# Patient Record
Sex: Female | Born: 1950 | Race: Black or African American | Hispanic: No | Smoking: Former smoker
Health system: Southern US, Community
[De-identification: ages and names within clinical notes are randomized; demographics above are authoritative.]

## PROBLEM LIST (undated history)

## (undated) DIAGNOSIS — I729 Aneurysm of unspecified site: Secondary | ICD-10-CM

## (undated) DIAGNOSIS — K8020 Calculus of gallbladder without cholecystitis without obstruction: Secondary | ICD-10-CM

## (undated) DIAGNOSIS — K219 Gastro-esophageal reflux disease without esophagitis: Secondary | ICD-10-CM

## (undated) DIAGNOSIS — G629 Polyneuropathy, unspecified: Secondary | ICD-10-CM

## (undated) DIAGNOSIS — I10 Essential (primary) hypertension: Secondary | ICD-10-CM

## (undated) DIAGNOSIS — E119 Type 2 diabetes mellitus without complications: Secondary | ICD-10-CM

## (undated) DIAGNOSIS — J309 Allergic rhinitis, unspecified: Secondary | ICD-10-CM

## (undated) DIAGNOSIS — I878 Other specified disorders of veins: Secondary | ICD-10-CM

## (undated) HISTORY — DX: Calculus of gallbladder without cholecystitis without obstruction: K80.20

## (undated) HISTORY — PX: CERVICAL FUSION: SHX112

## (undated) HISTORY — DX: Aneurysm of unspecified site: I72.9

## (undated) HISTORY — DX: Other specified disorders of veins: I87.8

## (undated) HISTORY — PX: EYE SURGERY: SHX253

---

## 1970-02-20 HISTORY — PX: ABDOMINAL HYSTERECTOMY: SHX81

## 2004-12-28 ENCOUNTER — Ambulatory Visit: Payer: Self-pay | Admitting: Family Medicine

## 2006-01-22 ENCOUNTER — Ambulatory Visit: Payer: Self-pay

## 2007-04-20 ENCOUNTER — Emergency Department: Payer: Self-pay | Admitting: Emergency Medicine

## 2007-05-17 ENCOUNTER — Encounter: Payer: Self-pay | Admitting: Physician Assistant

## 2007-05-22 ENCOUNTER — Encounter: Payer: Self-pay | Admitting: Physician Assistant

## 2007-05-27 ENCOUNTER — Ambulatory Visit: Payer: Self-pay | Admitting: Family Medicine

## 2007-08-05 ENCOUNTER — Ambulatory Visit: Payer: Self-pay

## 2007-10-16 ENCOUNTER — Ambulatory Visit: Payer: Self-pay | Admitting: Pain Medicine

## 2007-11-05 ENCOUNTER — Ambulatory Visit: Payer: Self-pay | Admitting: Pain Medicine

## 2007-11-14 ENCOUNTER — Ambulatory Visit: Payer: Self-pay | Admitting: Pain Medicine

## 2007-11-20 ENCOUNTER — Ambulatory Visit: Payer: Self-pay | Admitting: Pain Medicine

## 2007-12-04 ENCOUNTER — Ambulatory Visit: Payer: Self-pay | Admitting: Unknown Physician Specialty

## 2007-12-09 ENCOUNTER — Ambulatory Visit: Payer: Self-pay | Admitting: Unknown Physician Specialty

## 2008-06-10 ENCOUNTER — Ambulatory Visit: Payer: Self-pay

## 2009-06-16 ENCOUNTER — Ambulatory Visit: Payer: Self-pay

## 2010-01-17 ENCOUNTER — Ambulatory Visit: Payer: Self-pay

## 2010-06-21 ENCOUNTER — Ambulatory Visit: Payer: Self-pay

## 2011-06-28 ENCOUNTER — Ambulatory Visit: Payer: Self-pay | Admitting: Internal Medicine

## 2011-09-26 DIAGNOSIS — IMO0001 Reserved for inherently not codable concepts without codable children: Secondary | ICD-10-CM | POA: Insufficient documentation

## 2012-02-27 DIAGNOSIS — L309 Dermatitis, unspecified: Secondary | ICD-10-CM | POA: Insufficient documentation

## 2012-04-09 DIAGNOSIS — M1991 Primary osteoarthritis, unspecified site: Secondary | ICD-10-CM | POA: Insufficient documentation

## 2012-05-24 DIAGNOSIS — I10 Essential (primary) hypertension: Secondary | ICD-10-CM | POA: Insufficient documentation

## 2012-05-24 DIAGNOSIS — E119 Type 2 diabetes mellitus without complications: Secondary | ICD-10-CM | POA: Insufficient documentation

## 2012-05-24 DIAGNOSIS — Z794 Long term (current) use of insulin: Secondary | ICD-10-CM | POA: Insufficient documentation

## 2012-07-18 ENCOUNTER — Ambulatory Visit: Payer: Self-pay | Admitting: Adult Health

## 2012-10-10 ENCOUNTER — Ambulatory Visit: Payer: Self-pay | Admitting: Internal Medicine

## 2012-11-06 ENCOUNTER — Ambulatory Visit: Payer: Self-pay | Admitting: Adult Health

## 2013-07-24 ENCOUNTER — Ambulatory Visit: Payer: Self-pay | Admitting: Family Medicine

## 2014-01-18 ENCOUNTER — Ambulatory Visit: Payer: Self-pay

## 2014-06-07 ENCOUNTER — Ambulatory Visit
Admit: 2014-06-07 | Disposition: A | Discharge status: Home / Self Care | Payer: Self-pay | Attending: Family Medicine | Admitting: Family Medicine

## 2014-06-07 LAB — URINALYSIS, COMPLETE
BACTERIA: NEGATIVE
BLOOD: NEGATIVE
Bilirubin,UR: NEGATIVE
Glucose,UR: 500
Ketone: NEGATIVE
Leukocyte Esterase: NEGATIVE
Nitrite: NEGATIVE
PROTEIN: NEGATIVE
Ph: 6 (ref 5.0–8.0)
Specific Gravity: 1.01 (ref 1.000–1.030)

## 2014-07-03 ENCOUNTER — Other Ambulatory Visit: Payer: Self-pay | Admitting: Family Medicine

## 2014-07-03 ENCOUNTER — Other Ambulatory Visit: Payer: Self-pay | Admitting: Internal Medicine

## 2014-07-03 DIAGNOSIS — Z1231 Encounter for screening mammogram for malignant neoplasm of breast: Secondary | ICD-10-CM

## 2014-07-27 ENCOUNTER — Ambulatory Visit: Admission: RE | Admit: 2014-07-27 | Payer: Self-pay | Source: Ambulatory Visit

## 2014-08-04 ENCOUNTER — Ambulatory Visit
Admission: RE | Admit: 2014-08-04 | Discharge: 2014-08-04 | Disposition: A | Discharge status: Home / Self Care | Payer: Medicare HMO | Source: Ambulatory Visit | Attending: Family Medicine | Admitting: Family Medicine

## 2014-08-04 DIAGNOSIS — Z1231 Encounter for screening mammogram for malignant neoplasm of breast: Secondary | ICD-10-CM

## 2014-08-09 DIAGNOSIS — E1142 Type 2 diabetes mellitus with diabetic polyneuropathy: Secondary | ICD-10-CM | POA: Insufficient documentation

## 2014-08-09 DIAGNOSIS — Z4681 Encounter for fitting and adjustment of insulin pump: Secondary | ICD-10-CM | POA: Insufficient documentation

## 2014-10-25 ENCOUNTER — Ambulatory Visit
Admission: EM | Admit: 2014-10-25 | Discharge: 2014-10-25 | Disposition: A | Discharge status: Home / Self Care | Payer: Medicare HMO | Attending: Internal Medicine | Admitting: Internal Medicine

## 2014-10-25 DIAGNOSIS — J209 Acute bronchitis, unspecified: Secondary | ICD-10-CM

## 2014-10-25 DIAGNOSIS — J019 Acute sinusitis, unspecified: Secondary | ICD-10-CM | POA: Diagnosis not present

## 2014-10-25 HISTORY — DX: Type 2 diabetes mellitus without complications: E11.9

## 2014-10-25 HISTORY — DX: Allergic rhinitis, unspecified: J30.9

## 2014-10-25 HISTORY — DX: Polyneuropathy, unspecified: G62.9

## 2014-10-25 HISTORY — DX: Gastro-esophageal reflux disease without esophagitis: K21.9

## 2014-10-25 HISTORY — DX: Essential (primary) hypertension: I10

## 2014-10-25 MED ORDER — PREDNISONE 50 MG PO TABS: 50.0000 mg | ORAL_TABLET | Freq: Every day | ORAL | Status: DC

## 2014-10-25 MED ORDER — IPRATROPIUM BROMIDE HFA 17 MCG/ACT IN AERS: 2.0000 | INHALATION_SPRAY | RESPIRATORY_TRACT | Status: DC | PRN

## 2014-10-25 NOTE — ED Provider Notes (Signed)
CSN: [id number]     Arrival date & time 10/25/14  1223 History   First MD Initiated Contact with Patient 10/25/14 1338     Chief Complaint  Patient presents with  . Cough  . Hip Pain   HPI  Patient is a 64 year old lady with past history of smoking who has been having some trouble with respiratory symptoms since the middle of August. She saw her PCP, Sabino Snipes, on August 22, and received prescriptions for amoxicillin, Tessalon Perles, and a Ventolin inhaler. She has had some improvement in her sinus symptoms, but now is having worsening wheezing and cough, some chest tightness, central chest hurts when she coughs. She also reports some longer standing discomfort in her left low back and hip with discomfort radiating down her leg. She has an appointment with a spine specialist on September 8. She was able to walk into the urgent care today independently. No fever. No GI symptoms reported  Past Medical History  Diagnosis Date  . Allergic rhinitis   . Diabetes type 2, controlled   . Hypertension   . GERD (gastroesophageal reflux disease)   . Neuropathy    Past Surgical History  Procedure Laterality Date  . Abdominal hysterectomy  1972   Family History  Problem Relation Age of Onset  . Lung cancer Father    Social History  Substance Use Topics  . Smoking status: Former Smoker    Quit date: 02/21/1999  . Smokeless tobacco: None  . Alcohol Use: No    Review of Systems  All other systems reviewed and are negative.   Allergies  Review of patient's allergies indicates no known allergies.  Home Medications   Prior to Admission medications   Medication Sig Start Date End Date Taking? Authorizing Provider  albuterol (PROVENTIL HFA;VENTOLIN HFA) 108 (90 BASE) MCG/ACT inhaler Inhale into the lungs every 6 (six) hours as needed for wheezing or shortness of breath.   Yes Historical Provider, MD  benzonatate (TESSALON) 100 MG capsule Take by mouth 3 (three) times daily as needed  for cough.   Yes Historical Provider, MD  fluticasone (FLONASE) 50 MCG/ACT nasal spray Place into both nostrils daily.   Yes Historical Provider, MD  gabapentin (NEURONTIN) 800 MG tablet Take 800 mg by mouth 3 (three) times daily.   Yes Historical Provider, MD  hydrochlorothiazide (HYDRODIURIL) 25 MG tablet Take 25 mg by mouth daily.   Yes Historical Provider, MD  Insulin Glulisine (APIDRA SOLOSTAR) 100 UNIT/ML Solostar Pen Inject into the skin.   Yes Historical Provider, MD  ketotifen (ZADITOR) 0.025 % ophthalmic solution 1 drop 2 (two) times daily.   Yes Historical Provider, MD  lisinopril (PRINIVIL,ZESTRIL) 10 MG tablet Take 10 mg by mouth daily.   Yes Historical Provider, MD  loratadine (CLARITIN) 10 MG tablet Take 10 mg by mouth daily.   Yes Historical Provider, MD  metFORMIN (GLUMETZA) 500 MG (MOD) 24 hr tablet Take 500 mg by mouth daily with breakfast.   Yes Historical Provider, MD  naproxen (NAPROSYN) 500 MG tablet Take 500 mg by mouth 2 (two) times daily with a meal.   Yes Historical Provider, MD  pantoprazole (PROTONIX) 40 MG tablet Take 40 mg by mouth daily.   Yes Historical Provider, MD  polyethylene glycol (MIRALAX / GLYCOLAX) packet Take 17 g by mouth daily.   Yes Historical Provider, MD                  BP 130/87 mmHg  Pulse 83  Temp(Src)  98 F (36.7 C) (Oral)  Resp 16  Ht 5\' 7"  (1.702 m)  Wt 158 lb (71.668 kg)  BMI 24.74 kg/m2  SpO2 94%   Physical Exam  Constitutional: She is oriented to person, place, and time. No distress.  Alert, nicely groomed Coughing several times during the exam  HENT:  Head: Atraumatic.  Bilateral TMs are translucent, no erythema Moderate nasal congestion bilaterally Throat is pink   Eyes:  Conjugate gaze, no eye redness/drainage  Neck: Neck supple.  Cardiovascular: Normal rate and regular rhythm.   Pulmonary/Chest: No respiratory distress. She has no wheezes. She has no rales.  Coarse breath sounds throughout, symmetric breath  sounds Deep breath causes coughing  Abdominal: She exhibits no distension.  Musculoskeletal: She exhibits no edema.  No leg swelling  Neurological: She is alert and oriented to person, place, and time.  Skin: Skin is warm and dry.  No cyanosis  Nursing note and vitals reviewed.   ED Course  Procedures   MDM   1. Acute bronchitis, unspecified organism   2. Acute sinusitis, recurrence not specified, unspecified location    Discharge Medication List as of 10/25/2014  2:02 PM    START taking these medications   Details  ipratropium (ATROVENT HFA) 17 MCG/ACT inhaler Inhale 2 puffs into the lungs every 4 (four) hours as needed for wheezing., Starting 10/25/2014, Until Discontinued, Normal    predniSONE (DELTASONE) 50 MG tablet Take 1 tablet (50 mg total) by mouth daily., Starting 10/25/2014, Until Discontinued, Normal       Continue albuterol inhaler. Anticipate gradual improvement in cough over the next 2-3 weeks, improvement in wheezing over the next 2-3 days.Edmonia Lynch or follow-up PCP for new fever greater than 100.5, worsening wheezing/chest tightness, increasing phlegm production.    Sherlene Shams, MD 10/25/14 [phone]

## 2014-10-25 NOTE — ED Notes (Signed)
Patient states that she has been having cough, congestion, and wheezing. She states that symptoms started on 10/12/2014 and she was given albuterol inhaler and tessalon perles. She states that the back pain and leg/hip pain have been going on for some time.

## 2014-10-25 NOTE — Discharge Instructions (Signed)
Prescriptions for ipratropium inhaler, prednisone, were sent to the [facility] on [address]. These should help with wheezing and cough.   Anticipate gradual improvement in the cough over the next 2-3 weeks; anticipate improvement in wheezing/chest tightness over the next few days. Recheck or followup with Ailene Ravel Soles/PCP if not starting to improve in a few days, for new fever >100.5, increasing phlegm production.  Acute Bronchitis Bronchitis is when the airways that extend from the windpipe into the lungs get red, puffy, and painful (inflamed). Bronchitis often causes thick spit (mucus) to develop. This leads to a cough. A cough is the most common symptom of bronchitis. In acute bronchitis, the condition usually begins suddenly and goes away over time (usually in 2 weeks). Smoking, allergies, and asthma can make bronchitis worse. Repeated episodes of bronchitis may cause more lung problems. HOME CARE  Rest.  Drink enough fluids to keep your pee (urine) clear or pale yellow (unless you need to limit fluids as told by your doctor).  Only take over-the-counter or prescription medicines as told by your doctor.  Avoid smoking and secondhand smoke. These can make bronchitis worse. If you are a smoker, think about using nicotine gum or skin patches. Quitting smoking will help your lungs heal faster.  Reduce the chance of getting bronchitis again by:  Washing your hands often.  Avoiding people with cold symptoms.  Trying not to touch your hands to your mouth, nose, or eyes.  Follow up with your doctor as told. GET HELP IF: Your symptoms do not improve after 1 week of treatment. Symptoms include:  Cough.  Fever.  Coughing up thick spit.  Body aches.  Chest congestion.  Chills.  Shortness of breath.  Sore throat. GET HELP RIGHT AWAY IF:   You have an increased fever.  You have chills.  You have severe shortness of breath.  You have bloody thick spit  (sputum).  You throw up (vomit) often.  You lose too much body fluid (dehydration).  You have a severe headache.  You faint. MAKE SURE YOU:   Understand these instructions.  Will watch your condition.  Will get help right away if you are not doing well or get worse. Document Released: 07/26/2007 Document Revised: 10/09/2012 Document Reviewed: 07/30/2012 [facility] Patient Information 2015 [place], [place]. This information is not intended to replace advice given to you by your health care provider. Make sure you discuss any questions you have with your health care provider.

## 2014-11-21 ENCOUNTER — Emergency Department
Admission: EM | Admit: 2014-11-21 | Discharge: 2014-11-21 | Disposition: A | Discharge status: Home / Self Care | Payer: Medicare HMO | Attending: Emergency Medicine | Admitting: Emergency Medicine

## 2014-11-21 ENCOUNTER — Encounter: Payer: Self-pay | Admitting: Emergency Medicine

## 2014-11-21 DIAGNOSIS — E1165 Type 2 diabetes mellitus with hyperglycemia: Secondary | ICD-10-CM | POA: Diagnosis not present

## 2014-11-21 DIAGNOSIS — Z7951 Long term (current) use of inhaled steroids: Secondary | ICD-10-CM | POA: Insufficient documentation

## 2014-11-21 DIAGNOSIS — Z87891 Personal history of nicotine dependence: Secondary | ICD-10-CM | POA: Diagnosis not present

## 2014-11-21 DIAGNOSIS — M549 Dorsalgia, unspecified: Secondary | ICD-10-CM | POA: Diagnosis not present

## 2014-11-21 DIAGNOSIS — Z79899 Other long term (current) drug therapy: Secondary | ICD-10-CM | POA: Diagnosis not present

## 2014-11-21 DIAGNOSIS — Z7952 Long term (current) use of systemic steroids: Secondary | ICD-10-CM | POA: Insufficient documentation

## 2014-11-21 DIAGNOSIS — Z791 Long term (current) use of non-steroidal anti-inflammatories (NSAID): Secondary | ICD-10-CM | POA: Diagnosis not present

## 2014-11-21 DIAGNOSIS — Z794 Long term (current) use of insulin: Secondary | ICD-10-CM | POA: Diagnosis not present

## 2014-11-21 DIAGNOSIS — R739 Hyperglycemia, unspecified: Secondary | ICD-10-CM

## 2014-11-21 DIAGNOSIS — R42 Dizziness and giddiness: Secondary | ICD-10-CM | POA: Diagnosis present

## 2014-11-21 DIAGNOSIS — I10 Essential (primary) hypertension: Secondary | ICD-10-CM | POA: Insufficient documentation

## 2014-11-21 LAB — URINALYSIS COMPLETE WITH MICROSCOPIC (ARMC ONLY)
Bacteria, UA: NONE SEEN
Bilirubin Urine: NEGATIVE
Glucose, UA: >500 mg/dL — AB
HGB URINE DIPSTICK: NEGATIVE
Ketones, ur: NEGATIVE mg/dL
Leukocytes, UA: NEGATIVE
Nitrite: NEGATIVE
Protein, ur: NEGATIVE mg/dL
SPECIFIC GRAVITY, URINE: 1.016 (ref 1.005–1.030)
WBC, UA: NONE SEEN WBC/hpf (ref 0–5)
pH: 5 (ref 5.0–8.0)

## 2014-11-21 LAB — BASIC METABOLIC PANEL
ANION GAP: 9 (ref 5–15)
BUN: 15 mg/dL (ref 6–20)
CALCIUM: 9.1 mg/dL (ref 8.9–10.3)
CO2: 26 mmol/L (ref 22–32)
Chloride: 97 mmol/L — ABNORMAL LOW (ref 101–111)
Creatinine, Ser: 1.11 mg/dL — ABNORMAL HIGH (ref 0.44–1.00)
GFR calc non Af Amer: 51 mL/min — ABNORMAL LOW (ref >60)
GFR, EST AFRICAN AMERICAN: 59 mL/min — AB (ref >60)
Glucose, Bld: 524 mg/dL (ref 65–99)
POTASSIUM: 4 mmol/L (ref 3.5–5.1)
Sodium: 132 mmol/L — ABNORMAL LOW (ref 135–145)

## 2014-11-21 LAB — CBC
HCT: 38.1 % (ref 35.0–47.0)
Hemoglobin: 11.8 g/dL — ABNORMAL LOW (ref 12.0–16.0)
MCH: 23.6 pg — AB (ref 26.0–34.0)
MCHC: 31.1 g/dL — ABNORMAL LOW (ref 32.0–36.0)
MCV: 76 fL — ABNORMAL LOW (ref 80.0–100.0)
Platelets: 344 10*3/uL (ref 150–440)
RBC: 5.01 MIL/uL (ref 3.80–5.20)
RDW: 13.6 % (ref 11.5–14.5)
WBC: 12.3 10*3/uL — ABNORMAL HIGH (ref 3.6–11.0)

## 2014-11-21 LAB — GLUCOSE, CAPILLARY
GLUCOSE-CAPILLARY: 499 mg/dL — AB (ref 65–99)
Glucose-Capillary: 339 mg/dL — ABNORMAL HIGH (ref 65–99)

## 2014-11-21 MED ORDER — SODIUM CHLORIDE 0.9 % IV BOLUS (SEPSIS): 1000.0000 mL | Freq: Once | INTRAVENOUS | Status: DC

## 2014-11-21 NOTE — ED Notes (Signed)
Discharge instructions and follow-up care reviewed with patient. No questions at this time. Pt stable at discharge.

## 2014-11-21 NOTE — ED Provider Notes (Signed)
Time Seen: Approximately ----------------------------------------- 5:18 PM on 11/21/2014 -----------------------------------------    I have reviewed the triage notes  Chief Complaint: Back Pain and Dizziness   History of Present Illness: Audrey Chan is a 64 y.o. female who presents with feelings of dizziness described as lightheadedness. Patient states she recently had an epidural on Wednesday for sciatica . She is an insulin-dependent diabetic and has an insulin pump. Patient states that she's noticed that her blood sugars have run elevated over the last 48 hours. She states her pump seems to be working and she used it last night. She states she also used it this morning though had a soft drink at home but was not a diet soft drink and has not used her pump since that time. She denies any chest pain or shortness of breath. She denies any fever. She denies any weakness in her left leg. She denies any incontinence of bladder or bowel function.  Past Medical History  Diagnosis Date  . Allergic rhinitis   . Diabetes type 2, controlled ([place])   . Hypertension   . GERD (gastroesophageal reflux disease)   . Neuropathy ([place])     There are no active problems to display for this patient.   Past Surgical History  Procedure Laterality Date  . Abdominal hysterectomy  1972    Past Surgical History  Procedure Laterality Date  . Abdominal hysterectomy  1972    Current Outpatient Rx  Name  Route  Sig  Dispense  Refill  . albuterol (PROVENTIL HFA;VENTOLIN HFA) 108 (90 BASE) MCG/ACT inhaler   Inhalation   Inhale into the lungs every 6 (six) hours as needed for wheezing or shortness of breath.         . benzonatate (TESSALON) 100 MG capsule   Oral   Take by mouth 3 (three) times daily as needed for cough.         . fluticasone (FLONASE) 50 MCG/ACT nasal spray   Each Nare   Place into both nostrils daily.         Marland Kitchen gabapentin (NEURONTIN) 800 MG tablet   Oral    Take 800 mg by mouth 3 (three) times daily.         . hydrochlorothiazide (HYDRODIURIL) 25 MG tablet   Oral   Take 25 mg by mouth daily.         . Insulin Glulisine (APIDRA SOLOSTAR) 100 UNIT/ML Solostar Pen   Subcutaneous   Inject into the skin.         Marland Kitchen ipratropium (ATROVENT HFA) 17 MCG/ACT inhaler   Inhalation   Inhale 2 puffs into the lungs every 4 (four) hours as needed for wheezing.   1 Inhaler   12   . ketotifen (ZADITOR) 0.025 % ophthalmic solution      1 drop 2 (two) times daily.         Marland Kitchen lisinopril (PRINIVIL,ZESTRIL) 10 MG tablet   Oral   Take 10 mg by mouth daily.         Marland Kitchen loratadine (CLARITIN) 10 MG tablet   Oral   Take 10 mg by mouth daily.         . metFORMIN (GLUMETZA) 500 MG (MOD) 24 hr tablet   Oral   Take 500 mg by mouth daily with breakfast.         . naproxen (NAPROSYN) 500 MG tablet   Oral   Take 500 mg by mouth 2 (two) times daily with a meal.         .  pantoprazole (PROTONIX) 40 MG tablet   Oral   Take 40 mg by mouth daily.         . polyethylene glycol (MIRALAX / GLYCOLAX) packet   Oral   Take 17 g by mouth daily.         . predniSONE (DELTASONE) 50 MG tablet   Oral   Take 1 tablet (50 mg total) by mouth daily.   5 tablet   0     Allergies:  Review of patient's allergies indicates no known allergies.  Family History: Family History  Problem Relation Age of Onset  . Lung cancer Father     Social History: Social History  Substance Use Topics  . Smoking status: Former Smoker    Quit date: 02/21/1999  . Smokeless tobacco: None  . Alcohol Use: No     Review of Systems:   10 point review of systems was performed and was otherwise negative:  Constitutional: No fever. Increased thirst Eyes: No visual disturbances ENT: No sore throat, ear pain Cardiac: No chest pain Respiratory: No shortness of breath, wheezing, or stridor Abdomen: No abdominal pain, no vomiting, No diarrhea Endocrine: No weight  loss, No night sweats Extremities: No peripheral edema, cyanosis Skin: No rashes, easy bruising Neurologic: No focal weakness, trouble with speech or swollowing Urologic: No dysuria, Hematuria, patient does describe urinary frequency Physical Exam:  ED Triage Vitals  Enc Vitals Group     BP 11/21/14 1451 121/81 mmHg     Pulse Rate 11/21/14 1451 90     Resp 11/21/14 1451 20     Temp 11/21/14 1451 98.5 F (36.9 C)     Temp Source 11/21/14 1451 Oral     SpO2 11/21/14 1451 100 %     Weight 11/21/14 1451 158 lb (71.668 kg)     Height 11/21/14 1451 5\' 7"  (1.702 m)     Head Cir --      Peak Flow --      Pain Score 11/21/14 1452 5     Pain Loc --      Pain Edu? --      Excl. in [place]? --     General: Awake , Alert , and Oriented times 3; GCS 15 Head: Normal cephalic , atraumatic Eyes: Pupils equal , round, reactive to light Nose/Throat: No nasal drainage, patent upper airway without erythema or exudate.  Neck: Supple, Full range of motion, No anterior adenopathy or palpable thyroid masses Lungs: Clear to ascultation without wheezes , rhonchi, or rales Heart: Regular rate, regular rhythm without murmurs , gallops , or rubs Abdomen: Soft, non tender without rebound, guarding , or rigidity; bowel sounds positive and symmetric in all 4 quadrants. No organomegaly .        Extremities: 2 plus symmetric pulses. No edema, clubbing or cyanosis Neurologic: normal ambulation, patient is able to stand on her toes unassisted with normal strength Motor symmetric without deficits, sensory intact Skin: warm, dry, no rashes Examination of the epidural site shows no swelling or significant tenderness.  Labs:   All laboratory work was reviewed including any pertinent negatives or positives listed below:  Labs Reviewed  BASIC METABOLIC PANEL - Abnormal; Notable for the following:    Sodium 132 (*)    Chloride 97 (*)    Glucose, Bld 524 (*)    Creatinine, Ser 1.11 (*)    GFR calc non Af Amer 51 (*)     GFR calc Af Amer 59 (*)    All  other components within normal limits  CBC - Abnormal; Notable for the following:    WBC 12.3 (*)    Hemoglobin 11.8 (*)    MCV 76.0 (*)    MCH 23.6 (*)    MCHC 31.1 (*)    All other components within normal limits  URINALYSIS COMPLETEWITH MICROSCOPIC ([facility] ONLY) - Abnormal; Notable for the following:    Color, Urine STRAW (*)    APPearance CLEAR (*)    Glucose, UA >500 (*)    Squamous Epithelial / LPF 0-5 (*)    All other components within normal limits  GLUCOSE, CAPILLARY - Abnormal; Notable for the following:    Glucose-Capillary 499 (*)    All other components within normal limits  CBG MONITORING, ED    EKG:  ED ECG REPORT I, Daymon Larsen, the attending physician, personally viewed and interpreted this ECG.  Date: 11/21/2014 EKG Time: 1507 Rate: 95 Rhythm: normal sinus rhythm QRS Axis: normal Intervals: normal ST/T Wave abnormalities: Nonspecific T wave abnormality Conduction Disutrbances: none Narrative Interpretation: unremarkable   ED Course: Patient most likely has hyperglycemia secondary to her epidural injection and is clinically dehydrated presenting with feelings of lightheadedness. The patient was offered a liter of fluid and used her insulin pump. Repeat blood sugar decreased down to 330 she states she feels better and wants to try going home without the fluid bolus and to drink plenty of fluids. That is a reasonable decision on her part and she can always return here if she doesn't feels symptomatically improved. I felt further investigation for an epidural hematoma or abscess was not necessary at this time though cautioned the patient she had any leg weakness, trouble with urination, trouble bowel movements, etc. she should return here to emergency department.    Assessment: Hyperglycemia Recent epidural Insulin pump     Plan:  Patient was advised to return immediately if condition worsens. Patient was advised to  follow up with her primary care physician or other specialized physicians involved and in their current assessment.            Daymon Larsen, MD 11/21/14 1910

## 2014-11-21 NOTE — Discharge Instructions (Signed)
Hyperglycemia °Hyperglycemia occurs when the glucose (sugar) in your blood is too high. Hyperglycemia can happen for many reasons, but it most often happens to people who do not know they have diabetes or are not managing their diabetes properly.  °CAUSES  °Whether you have diabetes or not, there are other causes of hyperglycemia. Hyperglycemia can occur when you have diabetes, but it can also occur in other situations that you might not be as aware of, such as: °Diabetes °· If you have diabetes and are having problems controlling your blood glucose, hyperglycemia could occur because of some of the following reasons: °¨ Not following your meal plan. °¨ Not taking your diabetes medications or not taking it properly. °¨ Exercising less or doing less activity than you normally do. °¨ Being sick. °Pre-diabetes °· This cannot be ignored. Before people develop Type 2 diabetes, they almost always have "pre-diabetes." This is when your blood glucose levels are higher than normal, but not yet high enough to be diagnosed as diabetes. Research has shown that some long-term damage to the body, especially the heart and circulatory system, may already be occurring during pre-diabetes. If you take action to manage your blood glucose when you have pre-diabetes, you may delay or prevent Type 2 diabetes from developing. °Stress °· If you have diabetes, you may be "diet" controlled or on oral medications or insulin to control your diabetes. However, you may find that your blood glucose is higher than usual in the hospital whether you have diabetes or not. This is often referred to as "stress hyperglycemia." Stress can elevate your blood glucose. This happens because of hormones put out by the body during times of stress. If stress has been the cause of your high blood glucose, it can be followed regularly by your caregiver. That way he/she can make sure your hyperglycemia does not continue to get worse or progress to  diabetes. °Steroids °· Steroids are medications that act on the infection fighting system (immune system) to block inflammation or infection. One side effect can be a rise in blood glucose. Most people can produce enough extra insulin to allow for this rise, but for those who cannot, steroids make blood glucose levels go even higher. It is not unusual for steroid treatments to "uncover" diabetes that is developing. It is not always possible to determine if the hyperglycemia will go away after the steroids are stopped. A special blood test called an A1c is sometimes done to determine if your blood glucose was elevated before the steroids were started. °SYMPTOMS °· Thirsty. °· Frequent urination. °· Dry mouth. °· Blurred vision. °· Tired or fatigue. °· Weakness. °· Sleepy. °· Tingling in feet or leg. °DIAGNOSIS  °Diagnosis is made by monitoring blood glucose in one or all of the following ways: °· A1c test. This is a chemical found in your blood. °· Fingerstick blood glucose monitoring. °· Laboratory results. °TREATMENT  °First, knowing the cause of the hyperglycemia is important before the hyperglycemia can be treated. Treatment may include, but is not be limited to: °· Education. °· Change or adjustment in medications. °· Change or adjustment in meal plan. °· Treatment for an illness, infection, etc. °· More frequent blood glucose monitoring. °· Change in exercise plan. °· Decreasing or stopping steroids. °· Lifestyle changes. °HOME CARE INSTRUCTIONS  °· Test your blood glucose as directed. °· Exercise regularly. Your caregiver will give you instructions about exercise. Pre-diabetes or diabetes which comes on with stress is helped by exercising. °· Eat wholesome,   balanced meals. Eat often and at regular, fixed times. Your caregiver or nutritionist will give you a meal plan to guide your sugar intake.  Being at an ideal weight is important. If needed, losing as little as 10 to 15 pounds may help improve blood  glucose levels. SEEK MEDICAL CARE IF:   You have questions about medicine, activity, or diet.  You continue to have symptoms (problems such as increased thirst, urination, or weight gain). SEEK IMMEDIATE MEDICAL CARE IF:   You are vomiting or have diarrhea.  Your breath smells fruity.  You are breathing faster or slower.  You are very sleepy or incoherent.  You have numbness, tingling, or pain in your feet or hands.  You have chest pain.  Your symptoms get worse even though you have been following your caregiver's orders.  If you have any other questions or concerns. Document Released: 08/02/2000 Document Revised: 05/01/2011 Document Reviewed: 06/05/2011 [facility] Patient Information 2015 [place], [place]. This information is not intended to replace advice given to you by your health care provider. Make sure you discuss any questions you have with your health care provider.  Please return immediately if condition worsens. Please contact her primary physician or the physician you were given for referral. If you have any specialist physicians involved in her treatment and plan please also contact them. Thank you for using [place] [facility]. Please  keep close vigilance of your blood sugars at home. Drink plenty of fluids. Return here for fever or leg weakness or any other new concerns

## 2014-11-21 NOTE — ED Notes (Signed)
Pt to ed with c/o lower back pain and dizziness, pt reports she had epidural injection on Wednesday and then started with dizziness and back pain yesterday.  Pt reports she feels like she is going to fall while ambulating. Pt alert and oriented at this time.

## 2014-11-21 NOTE — ED Notes (Signed)
Per pt, she is ready to go home, does not want to wait for 1L NS bolus & would prefer to push fluids at home. MD aware of pt decision.

## 2014-12-02 ENCOUNTER — Emergency Department: Payer: Medicare HMO

## 2014-12-02 ENCOUNTER — Encounter: Payer: Self-pay | Admitting: Emergency Medicine

## 2014-12-02 ENCOUNTER — Emergency Department
Admission: EM | Admit: 2014-12-02 | Discharge: 2014-12-02 | Disposition: A | Discharge status: Home / Self Care | Payer: Medicare HMO | Attending: Emergency Medicine | Admitting: Emergency Medicine

## 2014-12-02 DIAGNOSIS — K8020 Calculus of gallbladder without cholecystitis without obstruction: Secondary | ICD-10-CM | POA: Insufficient documentation

## 2014-12-02 DIAGNOSIS — Z87891 Personal history of nicotine dependence: Secondary | ICD-10-CM | POA: Diagnosis not present

## 2014-12-02 DIAGNOSIS — Z79899 Other long term (current) drug therapy: Secondary | ICD-10-CM | POA: Diagnosis not present

## 2014-12-02 DIAGNOSIS — I10 Essential (primary) hypertension: Secondary | ICD-10-CM | POA: Insufficient documentation

## 2014-12-02 DIAGNOSIS — K2970 Gastritis, unspecified, without bleeding: Secondary | ICD-10-CM

## 2014-12-02 DIAGNOSIS — K8050 Calculus of bile duct without cholangitis or cholecystitis without obstruction: Secondary | ICD-10-CM

## 2014-12-02 DIAGNOSIS — Z7982 Long term (current) use of aspirin: Secondary | ICD-10-CM | POA: Insufficient documentation

## 2014-12-02 DIAGNOSIS — Z794 Long term (current) use of insulin: Secondary | ICD-10-CM | POA: Insufficient documentation

## 2014-12-02 DIAGNOSIS — E119 Type 2 diabetes mellitus without complications: Secondary | ICD-10-CM | POA: Diagnosis not present

## 2014-12-02 DIAGNOSIS — R1010 Upper abdominal pain, unspecified: Secondary | ICD-10-CM | POA: Diagnosis present

## 2014-12-02 LAB — CBC WITH DIFFERENTIAL/PLATELET
BASOS ABS: 0.2 10*3/uL — AB (ref 0–0.1)
BASOS PCT: 1 %
EOS ABS: 0.4 10*3/uL (ref 0–0.7)
Eosinophils Relative: 3 %
HEMATOCRIT: 39.4 % (ref 35.0–47.0)
Hemoglobin: 12.5 g/dL (ref 12.0–16.0)
Lymphocytes Relative: 34 %
Lymphs Abs: 4.7 10*3/uL — ABNORMAL HIGH (ref 1.0–3.6)
MCH: 24 pg — ABNORMAL LOW (ref 26.0–34.0)
MCHC: 31.8 g/dL — AB (ref 32.0–36.0)
MCV: 75.4 fL — ABNORMAL LOW (ref 80.0–100.0)
Monocytes Absolute: 1.1 10*3/uL — ABNORMAL HIGH (ref 0.2–0.9)
Monocytes Relative: 8 %
NEUTROS ABS: 7.5 10*3/uL — AB (ref 1.4–6.5)
Neutrophils Relative %: 54 %
PLATELETS: 356 10*3/uL (ref 150–440)
RBC: 5.22 MIL/uL — ABNORMAL HIGH (ref 3.80–5.20)
RDW: 14 % (ref 11.5–14.5)
WBC: 13.9 10*3/uL — ABNORMAL HIGH (ref 3.6–11.0)

## 2014-12-02 LAB — COMPREHENSIVE METABOLIC PANEL
ALBUMIN: 4 g/dL (ref 3.5–5.0)
ALK PHOS: 109 U/L (ref 38–126)
ALT: 22 U/L (ref 14–54)
ANION GAP: 10 (ref 5–15)
AST: 19 U/L (ref 15–41)
BUN: 14 mg/dL (ref 6–20)
CALCIUM: 9.8 mg/dL (ref 8.9–10.3)
CO2: 27 mmol/L (ref 22–32)
Chloride: 97 mmol/L — ABNORMAL LOW (ref 101–111)
Creatinine, Ser: 1.04 mg/dL — ABNORMAL HIGH (ref 0.44–1.00)
GFR calc Af Amer: >60 mL/min (ref >60)
GFR, EST NON AFRICAN AMERICAN: 55 mL/min — AB (ref >60)
Glucose, Bld: 239 mg/dL — ABNORMAL HIGH (ref 65–99)
POTASSIUM: 3.7 mmol/L (ref 3.5–5.1)
SODIUM: 134 mmol/L — AB (ref 135–145)
Total Bilirubin: 0.2 mg/dL — ABNORMAL LOW (ref 0.3–1.2)
Total Protein: 8.3 g/dL — ABNORMAL HIGH (ref 6.5–8.1)

## 2014-12-02 LAB — LIPASE, BLOOD: LIPASE: 12 U/L — AB (ref 22–51)

## 2014-12-02 MED ORDER — GI COCKTAIL ~~LOC~~
30.0000 mL | ORAL | Status: AC
  Administered 2014-12-02: 30 mL via ORAL
  Filled 2014-12-02: qty 30

## 2014-12-02 MED ORDER — RANITIDINE HCL 150 MG PO CAPS
150.0000 mg | ORAL_CAPSULE | Freq: Two times a day (BID) | ORAL | Status: DC
Start: 2014-12-02 — End: 2015-02-01

## 2014-12-02 MED ORDER — KETOROLAC TROMETHAMINE 30 MG/ML IJ SOLN
INTRAMUSCULAR | Status: AC
Start: 2014-12-02 — End: 2014-12-02
  Administered 2014-12-02: 30 mg via INTRAVENOUS
  Filled 2014-12-02: qty 1

## 2014-12-02 MED ORDER — OXYCODONE-ACETAMINOPHEN 5-325 MG PO TABS: 1.0000 | ORAL_TABLET | Freq: Four times a day (QID) | ORAL | Status: DC | PRN

## 2014-12-02 MED ORDER — ONDANSETRON 8 MG PO TBDP: 8.0000 mg | ORAL_TABLET | Freq: Three times a day (TID) | ORAL | Status: DC | PRN

## 2014-12-02 MED ORDER — FAMOTIDINE 20 MG PO TABS
40.0000 mg | ORAL_TABLET | Freq: Once | ORAL | Status: AC
  Administered 2014-12-02: 40 mg via ORAL
  Filled 2014-12-02: qty 2

## 2014-12-02 MED ORDER — SUCRALFATE 1 G PO TABS: 1.0000 g | ORAL_TABLET | Freq: Four times a day (QID) | ORAL | Status: DC

## 2014-12-02 MED ORDER — KETOROLAC TROMETHAMINE 30 MG/ML IJ SOLN
30.0000 mg | Freq: Once | INTRAMUSCULAR | Status: AC
  Administered 2014-12-02: 30 mg via INTRAVENOUS

## 2014-12-02 MED ORDER — ONDANSETRON 8 MG PO TBDP
8.0000 mg | ORAL_TABLET | Freq: Once | ORAL | Status: AC
  Administered 2014-12-02: 8 mg via ORAL
  Filled 2014-12-02: qty 1

## 2014-12-02 NOTE — ED Provider Notes (Signed)
[facility] Emergency Department Provider Note  ____________________________________________  Time seen: 10:40 AM  I have reviewed the triage vital signs and the nursing notes.   HISTORY  Chief Complaint Abdominal Pain    HPI Audrey Chan is a 64 y.o. female who complains of upper abdominal pain for about 8 days. The pain is constant waxing and waning and worsened with eating. It is better when she lies on her right side. She reports a history of gastritis and acid reflux but does not take anything for acid suppression currently. No chest pain shortness of breath exertional pain fever chills. She has nausea but no vomiting or diarrhea. No syncope     Past Medical History  Diagnosis Date  . Allergic rhinitis   . Diabetes type 2, controlled ([place])   . Hypertension   . GERD (gastroesophageal reflux disease)   . Neuropathy ([place])      There are no active problems to display for this patient.    Past Surgical History  Procedure Laterality Date  . Abdominal hysterectomy  1972     Current Outpatient Rx  Name  Route  Sig  Dispense  Refill  . albuterol (PROVENTIL HFA;VENTOLIN HFA) 108 (90 BASE) MCG/ACT inhaler   Inhalation   Inhale 2 puffs into the lungs every 6 (six) hours as needed for wheezing or shortness of breath.          Marland Kitchen aspirin EC 81 MG tablet   Oral   Take 81 mg by mouth daily.         . ferrous sulfate 325 (65 FE) MG tablet   Oral   Take 325 mg by mouth daily.         . fluticasone (FLONASE) 50 MCG/ACT nasal spray   Each Nare   Place 2 sprays into both nostrils daily as needed for allergies or rhinitis.          Marland Kitchen gabapentin (NEURONTIN) 800 MG tablet   Oral   Take 800 mg by mouth 3 (three) times daily.         . hydrochlorothiazide (HYDRODIURIL) 25 MG tablet   Oral   Take 25 mg by mouth daily.         . insulin glulisine (APIDRA) 100 UNIT/ML injection   Subcutaneous   Inject into the skin See admin  instructions. Continuously via pump         . ipratropium (ATROVENT HFA) 17 MCG/ACT inhaler   Inhalation   Inhale 2 puffs into the lungs every 4 (four) hours as needed for wheezing.   1 Inhaler   12   . ketotifen (ZADITOR) 0.025 % ophthalmic solution   Ophthalmic   Apply 1 drop to eye 2 (two) times daily as needed (for dry eyes).          Marland Kitchen lisinopril (PRINIVIL,ZESTRIL) 10 MG tablet   Oral   Take 10 mg by mouth daily.         Marland Kitchen loratadine (CLARITIN) 10 MG tablet   Oral   Take 10 mg by mouth daily.         . metFORMIN (GLUCOPHAGE-XR) 500 MG 24 hr tablet   Oral   Take 500 mg by mouth 2 (two) times daily.         . Multiple Vitamin (MULTI-VITAMINS) TABS   Oral   Take 1 tablet by mouth daily.         . polyethylene glycol (MIRALAX / GLYCOLAX) packet   Oral  Take 17 g by mouth daily as needed for mild constipation, moderate constipation or severe constipation.          . ondansetron (ZOFRAN ODT) 8 MG disintegrating tablet   Oral   Take 1 tablet (8 mg total) by mouth every 8 (eight) hours as needed for nausea or vomiting.   20 tablet   0   . oxyCODONE-acetaminophen (ROXICET) 5-325 MG tablet   Oral   Take 1 tablet by mouth every 6 (six) hours as needed for severe pain.   12 tablet   0   . predniSONE (DELTASONE) 50 MG tablet   Oral   Take 1 tablet (50 mg total) by mouth daily. Patient not taking: Reported on 12/02/2014   5 tablet   0   . ranitidine (ZANTAC) 150 MG capsule   Oral   Take 1 capsule (150 mg total) by mouth 2 (two) times daily.   28 capsule   0   . sucralfate (CARAFATE) 1 G tablet   Oral   Take 1 tablet (1 g total) by mouth 4 (four) times daily.   120 tablet   1      Allergies Review of patient's allergies indicates no known allergies.   Family History  Problem Relation Age of Onset  . Lung cancer Father     Social History Social History  Substance Use Topics  . Smoking status: Former Smoker    Quit date: 02/21/1999  .  Smokeless tobacco: None  . Alcohol Use: No    Review of Systems  Constitutional:   No fever or chills. No weight changes Eyes:   No blurry vision or double vision.  ENT:   No sore throat. Cardiovascular:   No chest pain. Respiratory:   No dyspnea or cough. Gastrointestinal:   Positive for abdominal pain without, vomiting and diarrhea.  No BRBPR or melena. Genitourinary:   Negative for dysuria, urinary retention, bloody urine, or difficulty urinating. Musculoskeletal:   Negative for back pain. No joint swelling or pain. Skin:   Negative for rash. Neurological:   Negative for headaches, focal weakness or numbness. Psychiatric:  No anxiety or depression.   Endocrine:  No hot/cold intolerance, changes in energy, or sleep difficulty.  10-point ROS otherwise negative.  ____________________________________________   PHYSICAL EXAM:  VITAL SIGNS: ED Triage Vitals  Enc Vitals Group     BP 12/02/14 0956 119/73 mmHg     Pulse Rate 12/02/14 0956 88     Resp 12/02/14 0956 18     Temp 12/02/14 0956 97.6 F (36.4 C)     Temp Source 12/02/14 0956 Oral     SpO2 12/02/14 0956 96 %     Weight 12/02/14 0956 154 lb (69.854 kg)     Height 12/02/14 0956 5\' 7"  (1.702 m)     Head Cir --      Peak Flow --      Pain Score 12/02/14 1211 3     Pain Loc --      Pain Edu? --      Excl. in [place]? --      Constitutional:   Alert and oriented. Well appearing and in no distress. Eyes:   No scleral icterus. No conjunctival pallor. PERRL. EOMI ENT   Head:   Normocephalic and atraumatic.   Nose:   No congestion/rhinnorhea. No septal hematoma   Mouth/Throat:   MMM, no pharyngeal erythema. No peritonsillar mass. No uvula shift.   Neck:   No stridor. No SubQ  emphysema. No meningismus. Hematological/Lymphatic/Immunilogical:   No cervical lymphadenopathy. Cardiovascular:   RRR. Normal and symmetric distal pulses are present in all extremities. No murmurs, rubs, or gallops. Respiratory:    Normal respiratory effort without tachypnea nor retractions. Breath sounds are clear and equal bilaterally. No wheezes/rales/rhonchi. Gastrointestinal:   Soft with tenderness in the right upper quadrant epigastric and left upper quadrant areas.. No distention. There is no CVA tenderness.  No rebound, rigidity, or guarding. Genitourinary:   deferred Musculoskeletal:   Nontender with normal range of motion in all extremities. No joint effusions.  No lower extremity tenderness.  No edema. Neurologic:   Normal speech and language.  CN 2-10 normal. Motor grossly intact. No pronator drift.  Normal gait. No gross focal neurologic deficits are appreciated.  Skin:    Skin is warm, dry and intact. No rash noted.  No petechiae, purpura, or bullae. Psychiatric:   Mood and affect are normal. Speech and behavior are normal. Patient exhibits appropriate insight and judgment.  ____________________________________________    LABS (pertinent positives/negatives) (all labs ordered are listed, but only abnormal results are displayed) Labs Reviewed  COMPREHENSIVE METABOLIC PANEL - Abnormal; Notable for the following:    Sodium 134 (*)    Chloride 97 (*)    Glucose, Bld 239 (*)    Creatinine, Ser 1.04 (*)    Total Protein 8.3 (*)    Total Bilirubin 0.2 (*)    GFR calc non Af Amer 55 (*)    All other components within normal limits  LIPASE, BLOOD - Abnormal; Notable for the following:    Lipase 12 (*)    All other components within normal limits  CBC WITH DIFFERENTIAL/PLATELET - Abnormal; Notable for the following:    WBC 13.9 (*)    RBC 5.22 (*)    MCV 75.4 (*)    MCH 24.0 (*)    MCHC 31.8 (*)    Neutro Abs 7.5 (*)    Lymphs Abs 4.7 (*)    Monocytes Absolute 1.1 (*)    Basophils Absolute 0.2 (*)    All other components within normal limits   ____________________________________________   EKG  Interpreted by me Normal sinus rhythm rate of 80, normal axis intervals. Poor R-wave progression  in anterior precordial leads. Normal ST segments and T waves.  ____________________________________________    RADIOLOGY  Ultrasound right upper quadrant shows a 7 mm gallstone, no obstruction or cholecystitis.  ____________________________________________   PROCEDURES   ____________________________________________   INITIAL IMPRESSION / ASSESSMENT AND PLAN / ED COURSE  Pertinent labs & imaging results that were available during my care of the patient were reviewed by me and considered in my medical decision making (see chart for details).  Patient presents with upper abdominal pain likely GERD versus cholecystitis. Check ultrasound and labs. We'll give Toradol Zofran and antacids.  ----------------------------------------- 1:50 PM on 12/02/2014 -----------------------------------------  Workup unremarkable except for cholelithiasis. The patient follow-up with surgery. She is tolerating oral intake and symptoms are controlled. Vital signs are stable. Low suspicion for AAA, suitable for discharge home.     ____________________________________________   FINAL CLINICAL IMPRESSION(S) / ED DIAGNOSES  Final diagnoses:  Gastritis  Biliary colic      Carrie Mew, MD 12/02/14 1351

## 2014-12-02 NOTE — ED Notes (Signed)
Pt returned from US

## 2014-12-02 NOTE — ED Notes (Signed)
Patient transported to Ultrasound 

## 2014-12-02 NOTE — ED Notes (Signed)
Pt says upper abd pain for 2 weeks.  Says she has been here and to pcp for same.

## 2014-12-02 NOTE — Discharge Instructions (Signed)
You were prescribed a medication that is potentially sedating. Do not drink alcohol, drive or participate in any other potentially dangerous activities while taking this medication as it may make you sleepy. Do not take this medication with any other sedating medications, either prescription or over-the-counter. If you were prescribed Percocet or Vicodin, do not take these with acetaminophen (Tylenol) as it is already contained within these medications.   Opioid pain medications (or "narcotics") can be habit forming.  Use it as little as possible to achieve adequate pain control.  Do not use or use it with extreme caution if you have a history of opiate abuse or dependence.  If you are on a pain contract with your primary care doctor or a pain specialist, be sure to let them know you were prescribed this medication today from the [facility] Emergency Department.  This medication is intended for your use only - do not give any to anyone else and keep it in a secure place where nobody else, especially children and pets, have access to it.  It will also cause or worsen constipation, so you may want to consider taking an over-the-counter stool softener while you are taking this medication.  Cholelithiasis Cholelithiasis (also called gallstones) is a form of gallbladder disease in which gallstones form in your gallbladder. The gallbladder is an organ that stores bile made in the liver, which helps digest fats. Gallstones begin as small crystals and slowly grow into stones. Gallstone pain occurs when the gallbladder spasms and a gallstone is blocking the duct. Pain can also occur when a stone passes out of the duct.  RISK FACTORS  Being female.   Having multiple pregnancies. Health care providers sometimes advise removing diseased gallbladders before future pregnancies.   Being obese.  Eating a diet heavy in fried foods and fat.   Being older than 17 years and increasing age.   Prolonged use  of medicines containing female hormones.   Having diabetes mellitus.   Rapidly losing weight.   Having a family history of gallstones (heredity).  SYMPTOMS  Nausea.   Vomiting.  Abdominal pain.   Yellowing of the skin (jaundice).   Sudden pain. It may persist from several minutes to several hours.  Fever.   Tenderness to the touch. In some cases, when gallstones do not move into the bile duct, people have no pain or symptoms. These are called "silent" gallstones.  TREATMENT Silent gallstones do not need treatment. In severe cases, emergency surgery may be required. Options for treatment include:  Surgery to remove the gallbladder. This is the most common treatment.  Medicines. These do not always work and may take 6-12 months or more to work.  Shock wave treatment (extracorporeal biliary lithotripsy). In this treatment an ultrasound machine sends shock waves to the gallbladder to break gallstones into smaller pieces that can pass into the intestines or be dissolved by medicine. HOME CARE INSTRUCTIONS   Only take over-the-counter or prescription medicines for pain, discomfort, or fever as directed by your health care provider.   Follow a low-fat diet until seen again by your health care provider. Fat causes the gallbladder to contract, which can result in pain.   Follow up with your health care provider as directed. Attacks are almost always recurrent and surgery is usually required for permanent treatment.  SEEK IMMEDIATE MEDICAL CARE IF:   Your pain increases and is not controlled by medicines.   You have a fever or persistent symptoms for more than 2-3 days.  You have a fever and your symptoms suddenly get worse.   You have persistent nausea and vomiting.  MAKE SURE YOU:   Understand these instructions.  Will watch your condition.  Will get help right away if you are not doing well or get worse.   This information is not intended to replace  advice given to you by your health care provider. Make sure you discuss any questions you have with your health care provider.   Document Released: 02/02/2005 Document Revised: 10/09/2012 Document Reviewed: 07/31/2012 Elsevier Interactive Patient Education 2016 Elsevier Inc.  Gastritis, Adult Gastritis is soreness and swelling (inflammation) of the lining of the stomach. Gastritis can develop as a sudden onset (acute) or long-term (chronic) condition. If gastritis is not treated, it can lead to stomach bleeding and ulcers. CAUSES  Gastritis occurs when the stomach lining is weak or damaged. Digestive juices from the stomach then inflame the weakened stomach lining. The stomach lining may be weak or damaged due to viral or bacterial infections. One common bacterial infection is the Helicobacter pylori infection. Gastritis can also result from excessive alcohol consumption, taking certain medicines, or having too much acid in the stomach.  SYMPTOMS  In some cases, there are no symptoms. When symptoms are present, they may include:  Pain or a burning sensation in the upper abdomen.  Nausea.  Vomiting.  An uncomfortable feeling of fullness after eating. DIAGNOSIS  Your caregiver may suspect you have gastritis based on your symptoms and a physical exam. To determine the cause of your gastritis, your caregiver may perform the following:  Blood or stool tests to check for the H pylori bacterium.  Gastroscopy. A thin, flexible tube (endoscope) is passed down the esophagus and into the stomach. The endoscope has a light and camera on the end. Your caregiver uses the endoscope to view the inside of the stomach.  Taking a tissue sample (biopsy) from the stomach to examine under a microscope. TREATMENT  Depending on the cause of your gastritis, medicines may be prescribed. If you have a bacterial infection, such as an H pylori infection, antibiotics may be given. If your gastritis is caused by too  much acid in the stomach, H2 blockers or antacids may be given. Your caregiver may recommend that you stop taking aspirin, ibuprofen, or other nonsteroidal anti-inflammatory drugs (NSAIDs). HOME CARE INSTRUCTIONS  Only take over-the-counter or prescription medicines as directed by your caregiver.  If you were given antibiotic medicines, take them as directed. Finish them even if you start to feel better.  Drink enough fluids to keep your urine clear or pale yellow.  Avoid foods and drinks that make your symptoms worse, such as:  Caffeine or alcoholic drinks.  Chocolate.  Peppermint or mint flavorings.  Garlic and onions.  Spicy foods.  Citrus fruits, such as oranges, lemons, or limes.  Tomato-based foods such as sauce, chili, salsa, and pizza.  Fried and fatty foods.  Eat small, frequent meals instead of large meals. SEEK IMMEDIATE MEDICAL CARE IF:   You have black or dark red stools.  You vomit blood or material that looks like coffee grounds.  You are unable to keep fluids down.  Your abdominal pain gets worse.  You have a fever.  You do not feel better after 1 week.  You have any other questions or concerns. MAKE SURE YOU:  Understand these instructions.  Will watch your condition.  Will get help right away if you are not doing well or get worse.  This information is not intended to replace advice given to you by your health care provider. Make sure you discuss any questions you have with your health care provider.   Document Released: 01/31/2001 Document Revised: 08/08/2011 Document Reviewed: 03/22/2011 Elsevier Interactive Patient Education [employer].

## 2014-12-10 ENCOUNTER — Telehealth: Payer: Self-pay | Admitting: *Deleted

## 2014-12-10 NOTE — Telephone Encounter (Signed)
Let message for patient to call the office to schedule an appointment with Dr.Sankar. Patient was seen in ER on 12/02/14 for gallbladder. SGS approved for her to be seen here. Patients does have humana THN so we will need to get referrel from primary care.

## 2014-12-21 ENCOUNTER — Encounter: Payer: Self-pay | Admitting: *Deleted

## 2014-12-22 ENCOUNTER — Ambulatory Visit (INDEPENDENT_AMBULATORY_CARE_PROVIDER_SITE_OTHER): Payer: Medicare HMO | Admitting: Surgery

## 2014-12-22 ENCOUNTER — Encounter: Payer: Self-pay | Admitting: Surgery

## 2014-12-22 ENCOUNTER — Other Ambulatory Visit: Payer: Self-pay | Admitting: *Deleted

## 2014-12-22 VITALS — BP 127/73 | HR 78 | Temp 98.3°F | Ht 67.0 in | Wt 157.0 lb

## 2014-12-22 DIAGNOSIS — K8012 Calculus of gallbladder with acute and chronic cholecystitis without obstruction: Secondary | ICD-10-CM | POA: Diagnosis not present

## 2014-12-22 DIAGNOSIS — K8020 Calculus of gallbladder without cholecystitis without obstruction: Secondary | ICD-10-CM

## 2014-12-22 DIAGNOSIS — K8050 Calculus of bile duct without cholangitis or cholecystitis without obstruction: Secondary | ICD-10-CM | POA: Insufficient documentation

## 2014-12-22 NOTE — Patient Instructions (Signed)
You will be contacted by our surgery scheduler detailing the time of your pre op appointment and your procedure. If you have any questions, please contact our office at your earliest convenience.

## 2014-12-22 NOTE — Progress Notes (Signed)
Patient ID: Audrey Chan, female   DOB: 03-17-1950, 64 y.o.   MRN: [id number]  Chief Complaint  Patient presents with  . Cholelithiasis    surgery consult    HPI  Audrey Chan is a 64 y.o. female.  Referred by the emergency room following an episode of significant epigastric and right upper quadrant abdominal pain for which she was seen in the emergency room 2 weeks ago. An ultrasound demonstrated a 7 mm gallstone. Common bile duct was 6 mm. There was no evidence of gallbladder wall thickening or pericholecystic fluid.  The patient describes several month history of intermittent sometimes postprandial and sometimes not epigastric abdominal pain and bloating nausea but no vomiting and right upper quadrant abdominal pain. Worse of this last weekend the patient had significant symptomatology with inability to eat up until yesterday. There is been no fevers no jaundice. The patient denies a history of diverticulitis fever or jaundice in the past. She has had a previous hysterectomy in 1972 for benign disease.  Past Medical History  Diagnosis Date  . Allergic rhinitis   . Diabetes type 2, controlled ([place])   . Hypertension   . GERD (gastroesophageal reflux disease)   . Neuropathy [facility])     Past Surgical History  Procedure Laterality Date  . Abdominal hysterectomy  1972  . Cervical fusion      Family History  Problem Relation Age of Onset  . Lung cancer Father     Social History Social History  Substance Use Topics  . Smoking status: Former Smoker    Quit date: 02/21/1999  . Smokeless tobacco: Former Systems developer  . Alcohol Use: No    No Known Allergies  Current Outpatient Prescriptions  Medication Sig Dispense Refill  . albuterol (PROVENTIL HFA;VENTOLIN HFA) 108 (90 BASE) MCG/ACT inhaler Inhale 2 puffs into the lungs every 6 (six) hours as needed for wheezing or shortness of breath.     Marland Kitchen aspirin EC 81 MG tablet Take 81 mg by mouth daily.    Marland Kitchen atorvastatin  (LIPITOR) 40 MG tablet     . BAYER CONTOUR NEXT TEST test strip     . ferrous sulfate 325 (65 FE) MG tablet Take 325 mg by mouth daily.    . fluconazole (DIFLUCAN) 150 MG tablet     . fluticasone (FLONASE) 50 MCG/ACT nasal spray Place 2 sprays into both nostrils daily as needed for allergies or rhinitis.     Marland Kitchen gabapentin (NEURONTIN) 800 MG tablet Take 800 mg by mouth 3 (three) times daily.    . hydrochlorothiazide (HYDRODIURIL) 25 MG tablet Take 25 mg by mouth daily.    . insulin glulisine (APIDRA) 100 UNIT/ML injection Inject into the skin See admin instructions. Continuously via pump    . ipratropium (ATROVENT HFA) 17 MCG/ACT inhaler Inhale 2 puffs into the lungs every 4 (four) hours as needed for wheezing. 1 Inhaler 12  . ketotifen (ZADITOR) 0.025 % ophthalmic solution Apply 1 drop to eye 2 (two) times daily as needed (for dry eyes).     Marland Kitchen lisinopril (PRINIVIL,ZESTRIL) 10 MG tablet Take 10 mg by mouth daily.    Marland Kitchen loratadine (CLARITIN) 10 MG tablet Take 10 mg by mouth daily.    . metFORMIN (GLUCOPHAGE-XR) 500 MG 24 hr tablet Take 500 mg by mouth 2 (two) times daily.    . Multiple Vitamin (MULTI-VITAMINS) TABS Take 1 tablet by mouth daily.    . pantoprazole (PROTONIX) 40 MG tablet     . polyethylene glycol (  MIRALAX / GLYCOLAX) packet Take 17 g by mouth daily as needed for mild constipation, moderate constipation or severe constipation.     . ranitidine (ZANTAC) 150 MG capsule Take 1 capsule (150 mg total) by mouth 2 (two) times daily. 28 capsule 0  . VOLTAREN 1 % GEL     . oxyCODONE-acetaminophen (ROXICET) 5-325 MG tablet Take 1 tablet by mouth every 6 (six) hours as needed for severe pain. (Patient not taking: Reported on 12/22/2014) 12 tablet 0   No current facility-administered medications for this visit.    Blood pressure 127/73, pulse 78, temperature 98.3 F (36.8 C), temperature source Oral, height 5\' 7"  (1.702 m), weight 157 lb (71.215 kg).  No results found for this or any previous  visit (from the past 48 hour(s)). No results found.   Review of Systems  Constitutional: Negative for fever and chills.  Eyes: Negative.   Respiratory: Negative for cough and hemoptysis.   Gastrointestinal: Positive for heartburn, nausea and abdominal pain. Negative for vomiting, diarrhea, constipation, blood in stool and melena.  Skin: Negative for itching and rash.  Psychiatric/Behavioral: Negative.     Physical Exam  Constitutional: She is well-developed, well-nourished, and in no distress. No distress.  HENT:  Head: Normocephalic and atraumatic.  Eyes: Conjunctivae are normal. No scleral icterus.  Neck: Normal range of motion. Thyromegaly present.  Cardiovascular: Normal rate, regular rhythm and normal heart sounds.   Pulmonary/Chest: Effort normal and breath sounds normal. No respiratory distress. She has no wheezes. She has no rales. She exhibits no tenderness.  Abdominal: She exhibits no distension and no mass. There is no tenderness. There is no rebound and no guarding.    Skin: Skin is warm. She is not diaphoretic.  Psychiatric: Mood, memory, affect and judgment normal.    Data Reviewed I personally reviewed the ultrasound images from 2 weeks ago. There is evidence of cholelithiasis without evidence of biliary ductal dilatation.  I have personally reviewed the patient's imaging, laboratory findings and medical records.    Assessment    This 64 year old female with a history of diabetes reflux disease and now symptomatic cholelithiasis. She has symptoms of recurrent biliary colic.  She would benefit from surgical intervention.    Plan    Laparoscopic cholecystectomy on November 7 as an outpatient.   I discussed with her the procedure was done under general anesthesia and the risk of bile duct injury being 1 in 200 as well as small risk of bleeding infection need for conversion open operation as well as postoperative ERCP.   All of her questions were answered she  wishes to proceed.         Sherri Rad MD, FACS 12/22/2014, 1:04 PM

## 2014-12-24 ENCOUNTER — Encounter
Admission: RE | Admit: 2014-12-24 | Discharge: 2014-12-24 | Disposition: A | Discharge status: Home / Self Care | Payer: Medicare HMO | Source: Ambulatory Visit | Attending: Surgery | Admitting: Surgery

## 2014-12-24 ENCOUNTER — Telehealth: Payer: Self-pay | Admitting: Surgery

## 2014-12-24 DIAGNOSIS — Z029 Encounter for administrative examinations, unspecified: Secondary | ICD-10-CM | POA: Diagnosis not present

## 2014-12-24 NOTE — Patient Instructions (Signed)
  Your procedure is scheduled on: Monday 12/28/2014 Report to Day Surgery. 2ND FLOOR MEDICAL MALL ENTRANCE To find out your arrival time please call [phone] between 1PM - 3PM on Friday 12/25/2014.  Remember: Instructions that are not followed completely may result in serious medical risk, up to and including death, or upon the discretion of your surgeon and anesthesiologist your surgery may need to be rescheduled.    __X__ 1. Do not eat food or drink liquids after midnight. No gum chewing or hard candies.     __X__ 2. No Alcohol for 24 hours before or after surgery.   ____ 3. Bring all medications with you on the day of surgery if instructed.    __X__ 4. Notify your doctor if there is any change in your medical condition     (cold, fever, infections).     Do not wear jewelry, make-up, hairpins, clips or nail polish.  Do not wear lotions, powders, or perfumes.  Do not shave 48 hours prior to surgery. Men may shave face and neck.  Do not bring valuables to the hospital.    [facility] is not responsible for any belongings or valuables.               Contacts, dentures or bridgework may not be worn into surgery.  Leave your suitcase in the car. After surgery it may be brought to your room.  For patients admitted to the hospital, discharge time is determined by your                treatment team.   Patients discharged the day of surgery will not be allowed to drive home.   Please read over the following fact sheets that you were given:   Surgical Site Infection Prevention   __X__ Take these medicines the morning of surgery with A SIP OF WATER:    1. GABAPENTIN  2. LISINOPRIL  3. PANTOPRAZOLE  4. RANITIDINE  5. LIPITOR  6.  ____ Fleet Enema (as directed)   __X__ SHOWER WITH CHG SOAP  __X__ Use inhalers on the day of surgery EVEN IF YOU THINK YOU DON'T NEED THEM  __X__ Stop metformin 2 days prior to surgery    ____ Take 1/2 of usual insulin dose the night before surgery  and none on the morning of surgery.   ____ Stop Coumadin/Plavix/aspirin on   ____ Stop Anti-inflammatories on    ____ Stop supplements until after surgery.    ____ Bring C-Pap to the hospital.

## 2014-12-24 NOTE — Telephone Encounter (Signed)
Pt advised of pre op date/time and sx date. Sx: 12/28/14 with Dr Hortencia Pilar Cholecystectomy Pre op: 12/24/14 @ 1:30pm--office.

## 2014-12-25 NOTE — Telephone Encounter (Signed)
ERROR

## 2014-12-28 ENCOUNTER — Ambulatory Visit
Admission: RE | Admit: 2014-12-28 | Discharge: 2014-12-28 | Disposition: A | Discharge status: Home / Self Care | Payer: Medicare HMO | Source: Ambulatory Visit | Attending: Surgery | Admitting: Surgery

## 2014-12-28 ENCOUNTER — Encounter
Admission: RE | Disposition: A | Discharge status: Home / Self Care | Payer: Self-pay | Source: Ambulatory Visit | Attending: Surgery

## 2014-12-28 ENCOUNTER — Ambulatory Visit: Payer: Medicare HMO | Admitting: Certified Registered Nurse Anesthetist

## 2014-12-28 ENCOUNTER — Telehealth: Payer: Self-pay

## 2014-12-28 ENCOUNTER — Encounter: Payer: Self-pay | Admitting: *Deleted

## 2014-12-28 DIAGNOSIS — Z7982 Long term (current) use of aspirin: Secondary | ICD-10-CM | POA: Insufficient documentation

## 2014-12-28 DIAGNOSIS — Z87891 Personal history of nicotine dependence: Secondary | ICD-10-CM | POA: Diagnosis not present

## 2014-12-28 DIAGNOSIS — E119 Type 2 diabetes mellitus without complications: Secondary | ICD-10-CM | POA: Insufficient documentation

## 2014-12-28 DIAGNOSIS — Z79899 Other long term (current) drug therapy: Secondary | ICD-10-CM | POA: Diagnosis not present

## 2014-12-28 DIAGNOSIS — R1013 Epigastric pain: Secondary | ICD-10-CM | POA: Diagnosis present

## 2014-12-28 DIAGNOSIS — G629 Polyneuropathy, unspecified: Secondary | ICD-10-CM | POA: Insufficient documentation

## 2014-12-28 DIAGNOSIS — I10 Essential (primary) hypertension: Secondary | ICD-10-CM | POA: Insufficient documentation

## 2014-12-28 DIAGNOSIS — K219 Gastro-esophageal reflux disease without esophagitis: Secondary | ICD-10-CM | POA: Diagnosis not present

## 2014-12-28 DIAGNOSIS — K8010 Calculus of gallbladder with chronic cholecystitis without obstruction: Secondary | ICD-10-CM | POA: Insufficient documentation

## 2014-12-28 DIAGNOSIS — Z7951 Long term (current) use of inhaled steroids: Secondary | ICD-10-CM | POA: Insufficient documentation

## 2014-12-28 DIAGNOSIS — K8020 Calculus of gallbladder without cholecystitis without obstruction: Secondary | ICD-10-CM

## 2014-12-28 DIAGNOSIS — Z90710 Acquired absence of both cervix and uterus: Secondary | ICD-10-CM | POA: Diagnosis not present

## 2014-12-28 DIAGNOSIS — K8012 Calculus of gallbladder with acute and chronic cholecystitis without obstruction: Secondary | ICD-10-CM | POA: Diagnosis not present

## 2014-12-28 DIAGNOSIS — J309 Allergic rhinitis, unspecified: Secondary | ICD-10-CM | POA: Insufficient documentation

## 2014-12-28 DIAGNOSIS — Z7984 Long term (current) use of oral hypoglycemic drugs: Secondary | ICD-10-CM | POA: Diagnosis not present

## 2014-12-28 HISTORY — PX: CHOLECYSTECTOMY: SHX55

## 2014-12-28 LAB — GLUCOSE, CAPILLARY
GLUCOSE-CAPILLARY: 55 mg/dL — AB (ref 65–99)
GLUCOSE-CAPILLARY: 140 mg/dL — AB (ref 65–99)
Glucose-Capillary: 328 mg/dL — ABNORMAL HIGH (ref 65–99)
Glucose-Capillary: 286 mg/dL — ABNORMAL HIGH (ref 65–99)

## 2014-12-28 SURGERY — LAPAROSCOPIC CHOLECYSTECTOMY
Anesthesia: General | Wound class: Clean Contaminated

## 2014-12-28 MED ORDER — FENTANYL CITRATE (PF) 100 MCG/2ML IJ SOLN
INTRAMUSCULAR | Status: DC | PRN
  Administered 2014-12-28: 150 ug via INTRAVENOUS
  Administered 2014-12-28: 100 ug via INTRAVENOUS

## 2014-12-28 MED ORDER — SODIUM CHLORIDE 0.9 % IV SOLN
INTRAVENOUS | Status: DC
  Administered 2014-12-28: 1000 mL/h via INTRAVENOUS

## 2014-12-28 MED ORDER — OXYCODONE-ACETAMINOPHEN 5-325 MG PO TABS
ORAL_TABLET | ORAL | Status: AC
  Filled 2014-12-28: qty 1

## 2014-12-28 MED ORDER — PHENYLEPHRINE HCL 10 MG/ML IJ SOLN
INTRAMUSCULAR | Status: DC | PRN
  Administered 2014-12-28 (×2): 100 ug via INTRAVENOUS

## 2014-12-28 MED ORDER — OXYCODONE-ACETAMINOPHEN 5-325 MG PO TABS
1.0000 | ORAL_TABLET | Freq: Four times a day (QID) | ORAL | Status: DC | PRN
  Administered 2014-12-28: 1 via ORAL

## 2014-12-28 MED ORDER — NEOSTIGMINE METHYLSULFATE 10 MG/10ML IV SOLN
INTRAVENOUS | Status: DC | PRN
  Administered 2014-12-28: 3 mg via INTRAVENOUS

## 2014-12-28 MED ORDER — ACETAMINOPHEN 10 MG/ML IV SOLN
INTRAVENOUS | Status: AC
  Filled 2014-12-28: qty 100

## 2014-12-28 MED ORDER — DEXTROSE 50 % IV SOLN
INTRAVENOUS | Status: AC
  Administered 2014-12-28: 12.5 g via INTRAVENOUS
  Filled 2014-12-28: qty 50

## 2014-12-28 MED ORDER — DEXTROSE 50 % IV SOLN
12.5000 g | Freq: Once | INTRAVENOUS | Status: AC
  Administered 2014-12-28: 12.5 g via INTRAVENOUS

## 2014-12-28 MED ORDER — GLYCOPYRROLATE 0.2 MG/ML IJ SOLN
INTRAMUSCULAR | Status: DC | PRN
  Administered 2014-12-28: .5 mg via INTRAVENOUS

## 2014-12-28 MED ORDER — PROPOFOL 10 MG/ML IV BOLUS
INTRAVENOUS | Status: DC | PRN
  Administered 2014-12-28: 160 mg via INTRAVENOUS

## 2014-12-28 MED ORDER — SODIUM CHLORIDE 0.9 % IV SOLN: INTRAVENOUS | Status: DC

## 2014-12-28 MED ORDER — ACETAMINOPHEN 10 MG/ML IV SOLN
INTRAVENOUS | Status: DC | PRN
  Administered 2014-12-28: 1000 mg via INTRAVENOUS

## 2014-12-28 MED ORDER — ENOXAPARIN SODIUM 40 MG/0.4ML ~~LOC~~ SOLN
40.0000 mg | Freq: Once | SUBCUTANEOUS | Status: AC
  Administered 2014-12-28: 40 mg via SUBCUTANEOUS
  Filled 2014-12-28 (×2): qty 0.4

## 2014-12-28 MED ORDER — CHLORHEXIDINE GLUCONATE 4 % EX LIQD: 1.0000 "application " | Freq: Once | CUTANEOUS | Status: DC

## 2014-12-28 MED ORDER — ONDANSETRON HCL 4 MG/2ML IJ SOLN: 4.0000 mg | Freq: Once | INTRAMUSCULAR | Status: DC | PRN

## 2014-12-28 MED ORDER — DEXAMETHASONE SODIUM PHOSPHATE 4 MG/ML IJ SOLN
INTRAMUSCULAR | Status: DC | PRN
  Administered 2014-12-28: 5 mg via INTRAVENOUS

## 2014-12-28 MED ORDER — CEFAZOLIN SODIUM-DEXTROSE 2-3 GM-% IV SOLR
2.0000 g | INTRAVENOUS | Status: AC
  Administered 2014-12-28: 2 g via INTRAVENOUS

## 2014-12-28 MED ORDER — LIDOCAINE HCL (CARDIAC) 20 MG/ML IV SOLN
INTRAVENOUS | Status: DC | PRN
  Administered 2014-12-28: 100 mg via INTRAVENOUS

## 2014-12-28 MED ORDER — DEXTROSE-NACL 5-0.9 % IV SOLN
INTRAVENOUS | Status: DC
  Administered 2014-12-28: 07:00:00 via INTRAVENOUS

## 2014-12-28 MED ORDER — BUPIVACAINE HCL (PF) 0.25 % IJ SOLN
INTRAMUSCULAR | Status: AC
  Filled 2014-12-28: qty 30

## 2014-12-28 MED ORDER — CEFAZOLIN SODIUM-DEXTROSE 2-3 GM-% IV SOLR
INTRAVENOUS | Status: AC
  Administered 2014-12-28: 2 g via INTRAVENOUS
  Filled 2014-12-28: qty 50

## 2014-12-28 MED ORDER — LACTATED RINGERS IV SOLN: INTRAVENOUS | Status: DC | PRN

## 2014-12-28 MED ORDER — BUPIVACAINE HCL 0.25 % IJ SOLN
INTRAMUSCULAR | Status: DC | PRN
  Administered 2014-12-28: 20 mL

## 2014-12-28 MED ORDER — ROCURONIUM BROMIDE 100 MG/10ML IV SOLN
INTRAVENOUS | Status: DC | PRN
  Administered 2014-12-28: 40 mg via INTRAVENOUS

## 2014-12-28 MED ORDER — HYDROMORPHONE HCL 1 MG/ML IJ SOLN: 0.2500 mg | INTRAMUSCULAR | Status: DC | PRN

## 2014-12-28 MED ORDER — MIDAZOLAM HCL 2 MG/2ML IJ SOLN
INTRAMUSCULAR | Status: DC | PRN
  Administered 2014-12-28: 1 mg via INTRAVENOUS

## 2014-12-28 MED ORDER — ROXICET 5-325 MG PO TABS: 1.0000 | ORAL_TABLET | Freq: Four times a day (QID) | ORAL | Status: DC | PRN

## 2014-12-28 MED ORDER — ONDANSETRON HCL 4 MG/2ML IJ SOLN
INTRAMUSCULAR | Status: DC | PRN
  Administered 2014-12-28: 4 mg via INTRAVENOUS

## 2014-12-28 SURGICAL SUPPLY — 45 items
APPLIER CLIP 5 13 M/L LIGAMAX5 (MISCELLANEOUS) ×3
BAG COUNTER SPONGE EZ (MISCELLANEOUS) ×2 IMPLANT
BULB RESERV EVAC DRAIN JP 100C (MISCELLANEOUS) IMPLANT
CANISTER SUCT 1200ML W/VALVE (MISCELLANEOUS) ×3 IMPLANT
CATH CHOLANG 76X19 KUMAR (CATHETERS) ×3 IMPLANT
CHLORAPREP W/TINT 26ML (MISCELLANEOUS) ×3 IMPLANT
CLIP APPLIE 5 13 M/L LIGAMAX5 (MISCELLANEOUS) ×1 IMPLANT
CLOSURE WOUND 1/2 X4 (GAUZE/BANDAGES/DRESSINGS) ×1
COUNTER SPONGE BAG EZ (MISCELLANEOUS) ×1
DEFOGGER SCOPE WARMER CLEARIFY (MISCELLANEOUS) ×3 IMPLANT
DISSECTOR KITTNER STICK (MISCELLANEOUS) ×2 IMPLANT
DISSECTORS/KITTNER STICK (MISCELLANEOUS) ×6
DRAIN CHANNEL JP 19F (MISCELLANEOUS) IMPLANT
DRAPE C-ARM XRAY 36X54 (DRAPES) IMPLANT
DRAPE SHEET LG 3/4 BI-LAMINATE (DRAPES) ×3 IMPLANT
DRAPE UTILITY 15X26 TOWEL STRL (DRAPES) ×6 IMPLANT
DRSG TEGADERM 2-3/8X2-3/4 SM (GAUZE/BANDAGES/DRESSINGS) ×12 IMPLANT
DRSG TELFA 3X8 NADH (GAUZE/BANDAGES/DRESSINGS) ×3 IMPLANT
ENDOPOUCH RETRIEVER 10 (MISCELLANEOUS) IMPLANT
GLOVE BIO SURGEON STRL SZ7.5 (GLOVE) ×3 IMPLANT
GOWN STRL REUS W/ TWL LRG LVL3 (GOWN DISPOSABLE) ×2 IMPLANT
GOWN STRL REUS W/ TWL XL LVL3 (GOWN DISPOSABLE) ×1 IMPLANT
GOWN STRL REUS W/TWL LRG LVL3 (GOWN DISPOSABLE) ×4
GOWN STRL REUS W/TWL XL LVL3 (GOWN DISPOSABLE) ×2
IRRIGATION STRYKERFLOW (MISCELLANEOUS) ×1 IMPLANT
IRRIGATOR STRYKERFLOW (MISCELLANEOUS) ×3
IV NS 1000ML (IV SOLUTION) ×2
IV NS 1000ML BAXH (IV SOLUTION) ×1 IMPLANT
LABEL OR SOLS (LABEL) ×3 IMPLANT
NEEDLE HYPO 25X1 1.5 SAFETY (NEEDLE) ×3 IMPLANT
NS IRRIG 500ML POUR BTL (IV SOLUTION) ×3 IMPLANT
PACK LAP CHOLECYSTECTOMY (MISCELLANEOUS) ×3 IMPLANT
PAD GROUND ADULT SPLIT (MISCELLANEOUS) ×3 IMPLANT
SCISSORS METZENBAUM CVD 33 (INSTRUMENTS) ×3 IMPLANT
SLEEVE ADV FIXATION 5X100MM (TROCAR) ×6 IMPLANT
STRAP SAFETY BODY (MISCELLANEOUS) ×3 IMPLANT
STRIP CLOSURE SKIN 1/2X4 (GAUZE/BANDAGES/DRESSINGS) ×2 IMPLANT
SUT ETHILON 3-0 FS-10 30 BLK (SUTURE)
SUT VIC AB 0 UR5 27 (SUTURE) ×9 IMPLANT
SUT VIC AB 4-0 FS2 27 (SUTURE) ×3 IMPLANT
SUTURE EHLN 3-0 FS-10 30 BLK (SUTURE) IMPLANT
SWABSTK COMLB BENZOIN TINCTURE (MISCELLANEOUS) ×3 IMPLANT
TROCAR XCEL BLUNT TIP 100MML (ENDOMECHANICALS) ×3 IMPLANT
TROCAR Z-THREAD OPTICAL 5X100M (TROCAR) ×3 IMPLANT
TUBING INSUFFLATOR HI FLOW (MISCELLANEOUS) ×3 IMPLANT

## 2014-12-28 NOTE — Op Note (Signed)
12/28/2014  8:46 AM  PATIENT:  Audrey Chan  64 y.o. female  PRE-OPERATIVE DIAGNOSIS:  recurrent biliary colic, cholelithiasis  POST-OPERATIVE DIAGNOSIS:  recurrent biliary colic, cholelithiasis  PROCEDURE:  Procedure(s): LAPAROSCOPIC CHOLECYSTECTOMY (N/A)  SURGEON:  Surgeon(s) and Role:    * Sherri Rad, MD - Primary  ASSISTANTS: scrub  ANESTHESIA:general     SPECIMEN:gallstones    EBL: minimal.  Description of procedure:   With the patient in the supine position general endotracheal anesthesia was induced. Her left arm was padded and tucked at her side. Sterile prep and drape was performed. Timeout was observed.   The standard 12 mm and 5 mm orientation of ports were achieved. Stay sutures being passed to the infravesical fascial. Pneumoperitoneum established. The patient was then positioned airplane right side up and in reverse Trendelenburg. Traction was then placed in the gallbladder.   The hepatoduodenal ligament was incised utilizing blunt technique and hook electrocautery through the peritoneal folds. A critical view of safety cholecystectomy was achieved.  The cystic duct was triply clipped on the portal side singly clipped on the gallbladder side and divided sharply. The anterior branch of the cystic artery was doubly ligated with hemoclips on the portal side singly clipped on the gallbladder side and then divided with sharp scissors. Further dissection along the gallbladder fossa demonstrated a posterior cystic artery branch was which was divided between 2 hemoclips proximally and one distally. The gallbladder was then retrieved off the gallbladder fossa utilizing hook cautery apparatus being placed into an Endo Catch device with minimal bleeding and no spillage of bile or stones. 5 mm camera was placed in the epigastric region to evaluate the umbilical port site no evidence of bowel injury was identified. Pneumoperitoneum was then reestablished. Ports were removed under  direct visualization. The infraumbilical fascial defect was reapproximated with an additional figure-of-eight #0 Vicryl suture in vertical orientation. The existing stay sutures being tied to each other. A total of 20 cc of 0.25% plain Marcaine was cut along all skin fascial incisions prior to closure. 4-0 subcuticular stitches were used to reapproximate skin edges. Benzoin Steri-Strips Telfa and Tegaderm were then used as dressing. The patient was then transported extubated and in stable condition to the recovery room by anesthesia services.  Hortencia Conradi, MD, FACS

## 2014-12-28 NOTE — Discharge Instructions (Signed)
AMBULATORY SURGERY  DISCHARGE INSTRUCTIONS   1) The drugs that you were given will stay in your system until tomorrow so for the next 24 hours you should not:  A) Drive an automobile B) Make any legal decisions C) Drink any alcoholic beverage   2) You may resume regular meals tomorrow.  Today it is better to start with liquids and gradually work up to solid foods.  You may eat anything you prefer, but it is better to start with liquids, then soup and crackers, and gradually work up to solid foods.   3) Please notify your doctor immediately if you have any unusual bleeding, trouble breathing, redness and pain at the surgery site, drainage, fever, or pain not relieved by medication.    4) Additional Instructions:    Laparoscopic Cholecystectomy, Care After Refer to this sheet in the next few weeks. These instructions provide you with information about caring for yourself after your procedure. Your health care provider may also give you more specific instructions. Your treatment has been planned according to current medical practices, but problems sometimes occur. Call your health care provider if you have any problems or questions after your procedure. WHAT TO EXPECT AFTER THE PROCEDURE After your procedure, it is common to have:  Pain at your incision sites. You will be given pain medicines to control your pain.  Mild nausea or vomiting. This should improve after the first 24 hours.  Bloating and possible shoulder pain from the gas that was used during the procedure. This will improve after the first 24 hours. HOME CARE INSTRUCTIONS Incision Care  Follow instructions from your health care provider about how to take care of your incisions. Make sure you:  Wash your hands with soap and water before you change your bandage (dressing). If soap and water are not available, use hand sanitizer.  Change your dressing as told by your health care provider.  Leave stitches (sutures),  skin glue, or adhesive strips in place. These skin closures may need to be in place for 2 weeks or longer. If adhesive strip edges start to loosen and curl up, you may trim the loose edges. Do not remove adhesive strips completely unless your health care provider tells you to do that.  Do not take baths, swim, or use a hot tub until your health care provider approves. Ask your health care provider if you can take showers. You may only be allowed to take sponge baths for bathing. General Instructions  Take over-the-counter and prescription medicines only as told by your health care provider.  Do not drive or operate heavy machinery while taking prescription pain medicine.  Return to your normal diet as told by your health care provider.  Do not lift anything that is heavier than 10 lb (4.5 kg).  Do not play contact sports for one week or until your health care provider approves. SEEK MEDICAL CARE IF:   You have redness, swelling, or pain at the site of your incision.  You have fluid, blood, or pus coming from your incision.  You notice a bad smell coming from your incision area.  Your surgical incisions break open.  You have a fever. SEEK IMMEDIATE MEDICAL CARE IF:  You develop a rash.  You have difficulty breathing.  You have chest pain.  You have increasing pain in your shoulders (shoulder strap areas).  You faint or have dizzy episodes while you are standing.  You have severe pain in your abdomen.  You have nausea or  vomiting that lasts for more than one day.   This information is not intended to replace advice given to you by your health care provider. Make sure you discuss any questions you have with your health care provider.   Document Released: 02/06/2005 Document Revised: 10/28/2014 Document Reviewed: 09/18/2012 Elsevier Interactive Patient Education 2016 [place].     Please contact your physician with any problems or Same Day Surgery at [phone],  Monday through Friday 6 am to 4 pm, or  at [facility] number at [phone].

## 2014-12-28 NOTE — Anesthesia Preprocedure Evaluation (Signed)
Anesthesia Evaluation  Patient identified by MRN, date of birth, ID band Patient awake    Reviewed: Allergy & Precautions, NPO status , Patient's Chart, lab work & pertinent test results  Airway Mallampati: II  TM Distance: >3 FB Neck ROM: Limited   Comment: C-spine fusion, limited extension. Dental  (+) Partial Upper, Partial Lower   Pulmonary former smoker,    Pulmonary exam normal        Cardiovascular Exercise Tolerance: Good hypertension, Pt. on medications Normal cardiovascular exam     Neuro/Psych    GI/Hepatic GERD  Medicated and Controlled,  Endo/Other  diabetes, Well Controlled, Type 2  Renal/GU      Musculoskeletal   Abdominal (+)  Abdomen: soft.    Peds  Hematology   Anesthesia Other Findings   Reproductive/Obstetrics                             Anesthesia Physical Anesthesia Plan  ASA: III  Anesthesia Plan: General   Post-op Pain Management:    Induction: Intravenous  Airway Management Planned: Oral ETT  Additional Equipment:   Intra-op Plan:   Post-operative Plan: Extubation in OR  Informed Consent: I have reviewed the patients History and Physical, chart, labs and discussed the procedure including the risks, benefits and alternatives for the proposed anesthesia with the patient or authorized representative who has indicated his/her understanding and acceptance.     Plan Discussed with: CRNA  Anesthesia Plan Comments:         Anesthesia Quick Evaluation

## 2014-12-28 NOTE — H&P (Signed)
Patient ID: Audrey Chan, female DOB: 1950-12-03, 64 y.o. MRN: [id number]  Chief Complaint  Patient presents with  . Cholelithiasis    surgery consult    HPI  Audrey Chan is a 64 y.o. female. Referred by the emergency room following an episode of significant epigastric and right upper quadrant abdominal pain for which she was seen in the emergency room 2 weeks ago. An ultrasound demonstrated a 7 mm gallstone. Common bile duct was 6 mm. There was no evidence of gallbladder wall thickening or pericholecystic fluid. The patient describes several month history of intermittent sometimes postprandial and sometimes not epigastric abdominal pain and bloating nausea but no vomiting and right upper quadrant abdominal pain. Worse of this last weekend the patient had significant symptomatology with inability to eat up until yesterday. There is been no fevers no jaundice. The patient denies a history of diverticulitis fever or jaundice in the past. She has had a previous hysterectomy in 1972 for benign disease.  Past Medical History  Diagnosis Date  . Allergic rhinitis   . Diabetes type 2, controlled ([place])   . Hypertension   . GERD (gastroesophageal reflux disease)   . Neuropathy [facility])     Past Surgical History  Procedure Laterality Date  . Abdominal hysterectomy  1972  . Cervical fusion      Family History  Problem Relation Age of Onset  . Lung cancer Father     Social History Social History  Substance Use Topics  . Smoking status: Former Smoker    Quit date: 02/21/1999  . Smokeless tobacco: Former Systems developer  . Alcohol Use: No    No Known Allergies  Current Outpatient Prescriptions  Medication Sig Dispense Refill  . albuterol (PROVENTIL HFA;VENTOLIN HFA) 108 (90 BASE) MCG/ACT inhaler Inhale 2 puffs into the lungs every 6 (six) hours as needed for wheezing or shortness of breath.      Marland Kitchen aspirin EC 81 MG tablet Take 81 mg by mouth daily.    Marland Kitchen atorvastatin (LIPITOR) 40 MG tablet     . BAYER CONTOUR NEXT TEST test strip     . ferrous sulfate 325 (65 FE) MG tablet Take 325 mg by mouth daily.    . fluconazole (DIFLUCAN) 150 MG tablet     . fluticasone (FLONASE) 50 MCG/ACT nasal spray Place 2 sprays into both nostrils daily as needed for allergies or rhinitis.     Marland Kitchen gabapentin (NEURONTIN) 800 MG tablet Take 800 mg by mouth 3 (three) times daily.    . hydrochlorothiazide (HYDRODIURIL) 25 MG tablet Take 25 mg by mouth daily.    . insulin glulisine (APIDRA) 100 UNIT/ML injection Inject into the skin See admin instructions. Continuously via pump    . ipratropium (ATROVENT HFA) 17 MCG/ACT inhaler Inhale 2 puffs into the lungs every 4 (four) hours as needed for wheezing. 1 Inhaler 12  . ketotifen (ZADITOR) 0.025 % ophthalmic solution Apply 1 drop to eye 2 (two) times daily as needed (for dry eyes).     Marland Kitchen lisinopril (PRINIVIL,ZESTRIL) 10 MG tablet Take 10 mg by mouth daily.    Marland Kitchen loratadine (CLARITIN) 10 MG tablet Take 10 mg by mouth daily.    . metFORMIN (GLUCOPHAGE-XR) 500 MG 24 hr tablet Take 500 mg by mouth 2 (two) times daily.    . Multiple Vitamin (MULTI-VITAMINS) TABS Take 1 tablet by mouth daily.    . pantoprazole (PROTONIX) 40 MG tablet     . polyethylene glycol (MIRALAX / GLYCOLAX) packet Take 17  g by mouth daily as needed for mild constipation, moderate constipation or severe constipation.     . ranitidine (ZANTAC) 150 MG capsule Take 1 capsule (150 mg total) by mouth 2 (two) times daily. 28 capsule 0  . VOLTAREN 1 % GEL     . oxyCODONE-acetaminophen (ROXICET) 5-325 MG tablet Take 1 tablet by mouth every 6 (six) hours as needed for severe pain. (Patient not taking: Reported on 12/22/2014) 12 tablet 0   No current facility-administered medications for this visit.    Blood  pressure 127/73, pulse 78, temperature 98.3 F (36.8 C), temperature source Oral, height 5\' 7"  (1.702 m), weight 157 lb (71.215 kg).   Lab Results Last 48 Hours    No results found for this or any previous visit (from the past 48 hour(s)).    Imaging Results (Last 48 hours)    No results found.     Review of Systems  Constitutional: Negative for fever and chills.  Eyes: Negative.  Respiratory: Negative for cough and hemoptysis.  Gastrointestinal: Positive for heartburn, nausea and abdominal pain. Negative for vomiting, diarrhea, constipation, blood in stool and melena.  Skin: Negative for itching and rash.  Psychiatric/Behavioral: Negative.    Physical Exam  Constitutional: She is well-developed, well-nourished, and in no distress. No distress.  HENT:  Head: Normocephalic and atraumatic.  Eyes: Conjunctivae are normal. No scleral icterus.  Neck: Normal range of motion. Thyromegaly present.  Cardiovascular: Normal rate, regular rhythm and normal heart sounds.  Pulmonary/Chest: Effort normal and breath sounds normal. No respiratory distress. She has no wheezes. She has no rales. She exhibits no tenderness.  Abdominal: She exhibits no distension and no mass. There is no tenderness. There is no rebound and no guarding.    Skin: Skin is warm. She is not diaphoretic.  Psychiatric: Mood, memory, affect and judgment normal.    Data Reviewed I personally reviewed the ultrasound images from 2 weeks ago. There is evidence of cholelithiasis without evidence of biliary ductal dilatation.  I have personally reviewed the patient's imaging, laboratory findings and medical records.   Assessment    This 64 year old female with a history of diabetes reflux disease and now symptomatic cholelithiasis. She has symptoms of recurrent biliary colic. She would benefit from surgical intervention.    Plan    Laparoscopic cholecystectomy on November 7 as an outpatient.   I discussed with  her the procedure was done under general anesthesia and the risk of bile duct injury being 1 in 200 as well as small risk of bleeding infection need for conversion open operation as well as postoperative ERCP.   All of her questions were answered she wishes to proceed.        Sherri Rad MD, FACS 12/22/2014, 1:04 PM

## 2014-12-28 NOTE — Anesthesia Procedure Notes (Signed)
Procedure Name: Intubation Date/Time: 12/28/2014 7:42 AM Performed by: Rosaria Ferries, Adreanna Fickel Pre-anesthesia Checklist: Patient identified, Emergency Drugs available, Suction available and Patient being monitored Patient Re-evaluated:Patient Re-evaluated prior to inductionOxygen Delivery Method: Circle system utilized Preoxygenation: Pre-oxygenation with 100% oxygen Intubation Type: IV induction Laryngoscope Size: Mac and 3 Grade View: Grade I Tube type: Oral Tube size: 7.0 mm Number of attempts: 1 Secured at: 21 cm Tube secured with: Tape Dental Injury: Teeth and Oropharynx as per pre-operative assessment

## 2014-12-28 NOTE — Anesthesia Postprocedure Evaluation (Signed)
  Anesthesia Post-op Note  Patient: Audrey Chan  Procedure(s) Performed: Procedure(s): LAPAROSCOPIC CHOLECYSTECTOMY (N/A)  Anesthesia type:General  Patient location: PACU  Post pain: Pain level controlled  Post assessment: Post-op Vital signs reviewed, Patient's Cardiovascular Status Stable, Respiratory Function Stable, Patent Airway and No signs of Nausea or vomiting  Post vital signs: Reviewed and stable  Last Vitals:  Filed Vitals:   12/28/14 0920  BP: 105/60  Pulse: 75  Temp:   Resp: 12    Level of consciousness: awake, alert  and patient cooperative  Complications: No apparent anesthesia complications

## 2014-12-28 NOTE — Interval H&P Note (Signed)
History and Physical Interval Note:  12/28/2014 6:42 AM  Audrey Chan  has presented today for surgery, with the diagnosis of recurrent biliary colic  The various methods of treatment have been discussed with the patient and family. After consideration of risks, benefits and other options for treatment, the patient has consented to  Procedure(s): LAPAROSCOPIC CHOLECYSTECTOMY (N/A) as a surgical intervention .  The patient's history has been reviewed, patient examined, no change in status, stable for surgery.  I have reviewed the patient's chart and labs.  Questions were answered to the patient's satisfaction.     Sherri Rad

## 2014-12-28 NOTE — Transfer of Care (Signed)
Immediate Anesthesia Transfer of Care Note  Patient: Audrey Chan  Procedure(s) Performed: Procedure(s): LAPAROSCOPIC CHOLECYSTECTOMY (N/A)  Patient Location: PACU  Anesthesia Type:General  Level of Consciousness: sedated and patient cooperative  Airway & Oxygen Therapy: Patient Spontanous Breathing and Patient connected to nasal cannula oxygen  Post-op Assessment: Report given to RN and Post -op Vital signs reviewed and stable  Post vital signs: Reviewed and stable  Last Vitals:  Filed Vitals:   12/28/14 0851  BP:   Pulse: 81  Temp:   Resp: 16    Complications: No apparent anesthesia complications

## 2014-12-28 NOTE — Progress Notes (Signed)
Inpatient Diabetes Program Recommendations  AACE/ADA: New Consensus Statement on Inpatient Glycemic Control (2015)  Target Ranges:  Prepandial:   less than 140 mg/dL      Peak postprandial:   less than 180 mg/dL (1-2 hours)      Critically ill patients:  140 - 180 mg/dL   Review of Glycemic Control Results for MOZEL, BURDETT (MRN [id number]) as of 12/28/2014 09:44  Ref. Range 12/28/2014 06:20 12/28/2014 07:18 12/28/2014 08:51  Glucose-Capillary Latest Ref Range: 65-99 mg/dL 55 (L) 140 (H) 328 (H)   Diabetes history: Type 2 Outpatient Diabetes medications: Metformin 500mg  bid Current orders for Inpatient glycemic control: none  Inpatient Diabetes Program Recommendations: Blood sugar of 55mg /dl treated with dextrose and resulting sugar of 328mg /dl.  No medication recommendations at this time.  Will follow.  Gentry Fitz, RN, BA, MHA, CDE Diabetes Coordinator Inpatient Diabetes Program  [phone] (Team Pager) [phone] ([place]) 12/28/2014 9:45 AM

## 2014-12-28 NOTE — OR Nursing (Signed)
Dr. Benjamine Mola anesthesia aware of Patient's blood sugar issues this am. Recheck BS was 140.

## 2014-12-28 NOTE — Telephone Encounter (Signed)
Audrey Chan called at this time from pharmacy and states that Dr. Marina Gravel had written a prescription with dispense as written on it. He needs to change medication to generic as he does not have brand. Read through all notes written by Dr. Marina Gravel and pharmacist was given verbal order to use generic.

## 2014-12-29 ENCOUNTER — Telehealth: Payer: Self-pay

## 2014-12-29 LAB — SURGICAL PATHOLOGY

## 2014-12-29 NOTE — Telephone Encounter (Signed)
Post-surgical call made to patient at this time. Patient is a little sore at incision sites but otherwise doing very well. States that she is having some nausea after taking pain medication. Explained to patient that this is completely normal for this type of medication and that she may take her Zofran ODT 15-30 minutes prior to taking pain medication and also make sure that she has eaten prior to taking medication. She verbalizes understanding of this. Will call office once again with any further questions. Post-op appointment reviewed with patient and she confirms that she will be at appointment.

## 2014-12-29 NOTE — Telephone Encounter (Signed)
Patient's FMLA forms were filled out and faxed. Wasn't able to contact patient since she was still in the hospital.

## 2015-01-01 ENCOUNTER — Inpatient Hospital Stay: Payer: Medicare HMO | Attending: Hematology and Oncology | Admitting: Hematology and Oncology

## 2015-01-01 ENCOUNTER — Inpatient Hospital Stay: Payer: Medicare HMO

## 2015-01-01 VITALS — BP 153/88 | HR 108 | Temp 96.5°F | Ht 67.0 in | Wt 155.6 lb

## 2015-01-01 DIAGNOSIS — D72820 Lymphocytosis (symptomatic): Secondary | ICD-10-CM | POA: Insufficient documentation

## 2015-01-01 DIAGNOSIS — Z9641 Presence of insulin pump (external) (internal): Secondary | ICD-10-CM | POA: Diagnosis not present

## 2015-01-01 DIAGNOSIS — K219 Gastro-esophageal reflux disease without esophagitis: Secondary | ICD-10-CM | POA: Insufficient documentation

## 2015-01-01 DIAGNOSIS — Z90710 Acquired absence of both cervix and uterus: Secondary | ICD-10-CM | POA: Diagnosis not present

## 2015-01-01 DIAGNOSIS — R634 Abnormal weight loss: Secondary | ICD-10-CM | POA: Diagnosis not present

## 2015-01-01 DIAGNOSIS — Z7984 Long term (current) use of oral hypoglycemic drugs: Secondary | ICD-10-CM | POA: Diagnosis not present

## 2015-01-01 DIAGNOSIS — Z801 Family history of malignant neoplasm of trachea, bronchus and lung: Secondary | ICD-10-CM | POA: Diagnosis not present

## 2015-01-01 DIAGNOSIS — R948 Abnormal results of function studies of other organs and systems: Secondary | ICD-10-CM | POA: Insufficient documentation

## 2015-01-01 DIAGNOSIS — M545 Low back pain: Secondary | ICD-10-CM | POA: Insufficient documentation

## 2015-01-01 DIAGNOSIS — Z79899 Other long term (current) drug therapy: Secondary | ICD-10-CM | POA: Diagnosis not present

## 2015-01-01 DIAGNOSIS — G629 Polyneuropathy, unspecified: Secondary | ICD-10-CM | POA: Insufficient documentation

## 2015-01-01 DIAGNOSIS — I10 Essential (primary) hypertension: Secondary | ICD-10-CM | POA: Insufficient documentation

## 2015-01-01 DIAGNOSIS — Z87891 Personal history of nicotine dependence: Secondary | ICD-10-CM | POA: Diagnosis not present

## 2015-01-01 DIAGNOSIS — R718 Other abnormality of red blood cells: Secondary | ICD-10-CM

## 2015-01-01 DIAGNOSIS — Z794 Long term (current) use of insulin: Secondary | ICD-10-CM | POA: Diagnosis not present

## 2015-01-01 DIAGNOSIS — R599 Enlarged lymph nodes, unspecified: Secondary | ICD-10-CM | POA: Diagnosis not present

## 2015-01-01 DIAGNOSIS — E119 Type 2 diabetes mellitus without complications: Secondary | ICD-10-CM | POA: Insufficient documentation

## 2015-01-01 DIAGNOSIS — D509 Iron deficiency anemia, unspecified: Secondary | ICD-10-CM | POA: Insufficient documentation

## 2015-01-01 DIAGNOSIS — Z7982 Long term (current) use of aspirin: Secondary | ICD-10-CM | POA: Insufficient documentation

## 2015-01-01 DIAGNOSIS — K760 Fatty (change of) liver, not elsewhere classified: Secondary | ICD-10-CM | POA: Insufficient documentation

## 2015-01-01 DIAGNOSIS — M5136 Other intervertebral disc degeneration, lumbar region: Secondary | ICD-10-CM | POA: Diagnosis not present

## 2015-01-01 DIAGNOSIS — K8020 Calculus of gallbladder without cholecystitis without obstruction: Secondary | ICD-10-CM | POA: Diagnosis not present

## 2015-01-01 DIAGNOSIS — R937 Abnormal findings on diagnostic imaging of other parts of musculoskeletal system: Secondary | ICD-10-CM

## 2015-01-01 LAB — CBC WITH DIFFERENTIAL/PLATELET
Basophils Absolute: 0.1 10*3/uL (ref 0–0.1)
Basophils Relative: 1 %
Eosinophils Absolute: 0.5 10*3/uL (ref 0–0.7)
Eosinophils Relative: 4 %
HCT: 36.9 % (ref 35.0–47.0)
Hemoglobin: 11.7 g/dL — ABNORMAL LOW (ref 12.0–16.0)
Lymphocytes Relative: 33 %
Lymphs Abs: 4.2 10*3/uL — ABNORMAL HIGH (ref 1.0–3.6)
MCH: 23.7 pg — ABNORMAL LOW (ref 26.0–34.0)
MCHC: 31.6 g/dL — ABNORMAL LOW (ref 32.0–36.0)
MCV: 74.9 fL — ABNORMAL LOW (ref 80.0–100.0)
Monocytes Absolute: 1.1 10*3/uL — ABNORMAL HIGH (ref 0.2–0.9)
Monocytes Relative: 8 %
Neutro Abs: 6.9 10*3/uL — ABNORMAL HIGH (ref 1.4–6.5)
Neutrophils Relative %: 54 %
Platelets: 400 10*3/uL (ref 150–440)
RBC: 4.93 MIL/uL (ref 3.80–5.20)
RDW: 13.7 % (ref 11.5–14.5)
WBC: 12.8 10*3/uL — ABNORMAL HIGH (ref 3.6–11.0)

## 2015-01-01 LAB — URIC ACID: Uric Acid, Serum: 6.3 mg/dL (ref 2.3–6.6)

## 2015-01-01 LAB — COMPREHENSIVE METABOLIC PANEL
ALT: 62 U/L — ABNORMAL HIGH (ref 14–54)
AST: 72 U/L — ABNORMAL HIGH (ref 15–41)
Albumin: 3.8 g/dL (ref 3.5–5.0)
Alkaline Phosphatase: 114 U/L (ref 38–126)
Anion gap: 8 (ref 5–15)
BUN: 10 mg/dL (ref 6–20)
CO2: 31 mmol/L (ref 22–32)
Calcium: 9.3 mg/dL (ref 8.9–10.3)
Chloride: 97 mmol/L — ABNORMAL LOW (ref 101–111)
Creatinine, Ser: 0.95 mg/dL (ref 0.44–1.00)
GFR calc Af Amer: >60 mL/min (ref >60)
GFR calc non Af Amer: >60 mL/min (ref >60)
Glucose, Bld: 177 mg/dL — ABNORMAL HIGH (ref 65–99)
Potassium: 4.1 mmol/L (ref 3.5–5.1)
Sodium: 136 mmol/L (ref 135–145)
Total Bilirubin: 0.3 mg/dL (ref 0.3–1.2)
Total Protein: 7.9 g/dL (ref 6.5–8.1)

## 2015-01-01 LAB — SEDIMENTATION RATE: Sed Rate: 58 mm/hr — ABNORMAL HIGH (ref 0–30)

## 2015-01-01 LAB — FERRITIN: Ferritin: 380 ng/mL — ABNORMAL HIGH (ref 11–307)

## 2015-01-01 LAB — LACTATE DEHYDROGENASE: LDH: 187 U/L (ref 98–192)

## 2015-01-01 LAB — IRON AND TIBC
Iron: 31 ug/dL (ref 28–170)
Saturation Ratios: 11 % (ref 10.4–31.8)
TIBC: 281 ug/dL (ref 250–450)
UIBC: 250 ug/dL

## 2015-01-01 NOTE — Progress Notes (Signed)
[facility] day:  01/01/2015  Chief Complaint: ZYRAH WISWELL is a 64 y.o. female with an abnormal MRI and unintentional weight loss who is referred in consultation by Dr. Sabino Snipes.  HPI: The patient notes a history of diabetes on an insulin pump.  Recently she has had issues with her gallbladder. She presented with severe pain to the urgent care and emergency room.  Abdominal ultrasound on 12/02/2014 revealed cholelithiasis without evidence of acute cholecystitis. She had fatty infiltration of the liver with a focal 7 x 8 mm lesion likely hemangioma in the central liver.   She was seen in consultation by Dr. Sherri Rad. She describes a several month history of intermittent, progressive with each occurrence, episodes of abdominal pain, bloating, and nausea. She states that because of the symptoms her appetite decreased and she lost weight. She notes within the past year, she has lost about 10-1/2 pounds of which a 4-1/2 occurred in the past 2 weeks. She states her baseline weight is 163-164 pounds. She recently she has gained 2 pounds.  Patient underwent laparascopic cholecystectomy on 12/28/2014 by Dr. Sherri Rad. She tolerated the procedure well.  Symptomatically, the patient and his energy level is good. She is still sore from surgery.  She denies any fevers. She notes a few sweats.  She feels she gets hot because of more covers.  Approximately 3 years ago she had an EGD and colonoscopy which were negative.  Mammogram on 08/04/2014 was normal.  She has a history of iron deficiency anemia. She states her diet is "funny".  She watches her carbohydrates.  She describes not being a big meat eater. She craves sweets.  MRI of the lumbar spine on 11/12/2014 for lumbar radiculopathy revealed multilevel lumbar spine degenerative disc disease with moderate L4-L5 and L5-S1 spinal canal narrowing and severe left L4-L5 and bilateral L5-S1 neural foraminal  narrowing.  In addition, there was bone marrow heterogeneity which can be consistent with steroids, smoking, and many chronic diseases including multiple myeloma.  Notes indicated that she received a steroid injection for her low back pain at the end of September/beginning of October 2016.  Labs on 11/25/2014 serum pressure electrophoresis with no monoclonal spike.  Immunoglobulins consisted of an IgG of 1309, IgA 338, and IgM 64. Iron studies with a saturation of 19% and a TIBC of 287 and a ferritin of 452 (elevated).  Past Medical History  Diagnosis Date  . Allergic rhinitis   . Diabetes type 2, controlled ([place])   . Hypertension   . GERD (gastroesophageal reflux disease)   . Neuropathy ([place])   . Cholelithiasis     Past Surgical History  Procedure Laterality Date  . Abdominal hysterectomy  1972  . Cervical fusion    . Cholecystectomy N/A 12/28/2014    Procedure: LAPAROSCOPIC CHOLECYSTECTOMY;  Surgeon: Sherri Rad, MD;  Location: [facility] ORS;  Service: General;  Laterality: N/A;    Family History  Problem Relation Age of Onset  . Lung cancer Father     Social History:  reports that she quit smoking about 15 years ago. She has quit using smokeless tobacco. She reports that she does not drink alcohol or use illicit drugs.  The patient's husband is deceased.  She previously smoked less than 1 pack a day beginning at age 65 or 36.  She denies any exposure to radiation or toxins.  The patient is alone today.  Allergies: No Known Allergies  Current Medications: Current Outpatient  Prescriptions  Medication Sig Dispense Refill  . albuterol (PROVENTIL HFA;VENTOLIN HFA) 108 (90 BASE) MCG/ACT inhaler Inhale 2 puffs into the lungs every 6 (six) hours as needed for wheezing or shortness of breath.     Marland Kitchen aspirin EC 81 MG tablet Take 81 mg by mouth daily.    Marland Kitchen atorvastatin (LIPITOR) 40 MG tablet Take 40 mg by mouth daily at 6 PM.     . ferrous sulfate 325 (65 FE) MG tablet Take 325 mg by mouth  daily.    . fluticasone (FLONASE) 50 MCG/ACT nasal spray Place 2 sprays into both nostrils daily as needed for allergies or rhinitis.     Marland Kitchen gabapentin (NEURONTIN) 800 MG tablet Take 800 mg by mouth 3 (three) times daily.    . hydrochlorothiazide (HYDRODIURIL) 25 MG tablet Take 25 mg by mouth daily.    . insulin glulisine (APIDRA) 100 UNIT/ML injection Inject into the skin See admin instructions. Continuously via pump    . ipratropium (ATROVENT HFA) 17 MCG/ACT inhaler Inhale 2 puffs into the lungs every 4 (four) hours as needed for wheezing. 1 Inhaler 12  . lisinopril (PRINIVIL,ZESTRIL) 10 MG tablet Take 10 mg by mouth daily.    Marland Kitchen loratadine (CLARITIN) 10 MG tablet Take 10 mg by mouth daily.    . metFORMIN (GLUCOPHAGE-XR) 500 MG 24 hr tablet Take 500 mg by mouth 2 (two) times daily.    . pantoprazole (PROTONIX) 40 MG tablet Take 40 mg by mouth daily.     . ranitidine (ZANTAC) 150 MG capsule Take 1 capsule (150 mg total) by mouth 2 (two) times daily. 28 capsule 0  . VOLTAREN 1 % GEL Apply 2 g topically daily as needed.     . carboxymethylcellulose (REFRESH PLUS) 0.5 % SOLN 1 drop 3 (three) times daily as needed.    . Multiple Vitamin (MULTI-VITAMINS) TABS Take 1 tablet by mouth daily.     No current facility-administered medications for this visit.    Review of Systems:  GENERAL:  Energy level is good.  Rare sweat (non-drenching).  No fevers.  Weight loss secondary to recent abdominal symptoms. PERFORMANCE STATUS (ECOG):  1 HEENT:  No visual changes, runny nose, sore throat, mouth sores or tenderness. Lungs: Shortness of breath with exertion. Cough and congestion.  No hemoptysis. Cardiac:  No chest pain, palpitations, orthopnea, or PND. GI:  Sore from surgery.  Constipation on stool softener.  No nausea, vomiting, diarrhea, melena or hematochezia. GU:  No urgency, frequency, dysuria, or hematuria. Musculoskeletal:  No back pain.  Left knee pain.  Arthritis.  No muscle  tenderness. Extremities:  No pain or swelling. Skin:  No rashes or skin changes. Neuro:  No headache, numbness or weakness, balance or coordination issues. Endocrine:  Diabetes.  No thyroid issues, hot flashes or night sweats. Psych:  No mood changes, depression or anxiety. Pain:  No focal pain. Review of systems:  All other systems reviewed and found to be negative.  Physical Exam: Blood pressure 153/88, pulse 108, temperature 96.5 F (35.8 C), height $RemoveBe'5\' 7"'YOPBkQmmP$  (1.702 m), weight 155 lb 10.3 oz (70.6 kg). GENERAL:  Well developed, well nourished, sitting comfortably in the exam room in no acute distress. MENTAL STATUS:  Alert and oriented to person, place and time. HEAD:  Short black hair.  Normocephalic, atraumatic, face symmetric, no Cushingoid features. EYES:  Glasses.  Brown eyes.  Pupils equal round and reactive to light and accomodation.  No conjunctivitis or scleral icterus. ENT:  Oropharynx clear without  lesion.  Tongue normal. Mucous membranes moist.  RESPIRATORY:  Clear to auscultation without rales, wheezes or rhonchi. CARDIOVASCULAR:  Regular rate and rhythm without murmur, rub or gallop. ABDOMEN:  Laparascopic surgery incisons.  Soft, non-tender, with active bowel sounds, and no hepatosplenomegaly.  No masses. SKIN:  No rashes, ulcers or lesions. EXTREMITIES: No edema, no skin discoloration or tenderness.  No palpable cords. LYMPH NODES:  No palpable cervical or inguinal adenopathy.  Questionable fingertip left supraclavicular node.  Left axillary fullness.   NEUROLOGICAL: Unremarkable. PSYCH:  Appropriate.  Appointment on 01/01/2015  Component Date Value Ref Range Status  . WBC 01/01/2015 12.8* 3.6 - 11.0 K/uL Final  . RBC 01/01/2015 4.93  3.80 - 5.20 MIL/uL Final  . Hemoglobin 01/01/2015 11.7* 12.0 - 16.0 g/dL Final  . HCT 01/01/2015 36.9  35.0 - 47.0 % Final  . MCV 01/01/2015 74.9* 80.0 - 100.0 fL Final  . MCH 01/01/2015 23.7* 26.0 - 34.0 pg Final  . MCHC 01/01/2015  31.6* 32.0 - 36.0 g/dL Final  . RDW 01/01/2015 13.7  11.5 - 14.5 % Final  . Platelets 01/01/2015 400  150 - 440 K/uL Final  . Neutrophils Relative % 01/01/2015 54   Final  . Neutro Abs 01/01/2015 6.9* 1.4 - 6.5 K/uL Final  . Lymphocytes Relative 01/01/2015 33   Final  . Lymphs Abs 01/01/2015 4.2* 1.0 - 3.6 K/uL Final  . Monocytes Relative 01/01/2015 8   Final  . Monocytes Absolute 01/01/2015 1.1* 0.2 - 0.9 K/uL Final  . Eosinophils Relative 01/01/2015 4   Final  . Eosinophils Absolute 01/01/2015 0.5  0 - 0.7 K/uL Final  . Basophils Relative 01/01/2015 1   Final  . Basophils Absolute 01/01/2015 0.1  0 - 0.1 K/uL Final  . Sodium 01/01/2015 136  135 - 145 mmol/L Final  . Potassium 01/01/2015 4.1  3.5 - 5.1 mmol/L Final  . Chloride 01/01/2015 97* 101 - 111 mmol/L Final  . CO2 01/01/2015 31  22 - 32 mmol/L Final  . Glucose, Bld 01/01/2015 177* 65 - 99 mg/dL Final  . BUN 01/01/2015 10  6 - 20 mg/dL Final  . Creatinine, Ser 01/01/2015 0.95  0.44 - 1.00 mg/dL Final  . Calcium 01/01/2015 9.3  8.9 - 10.3 mg/dL Final  . Total Protein 01/01/2015 7.9  6.5 - 8.1 g/dL Final  . Albumin 01/01/2015 3.8  3.5 - 5.0 g/dL Final  . AST 01/01/2015 72* 15 - 41 U/L Final  . ALT 01/01/2015 62* 14 - 54 U/L Final  . Alkaline Phosphatase 01/01/2015 114  38 - 126 U/L Final  . Total Bilirubin 01/01/2015 0.3  0.3 - 1.2 mg/dL Final  . GFR calc non Af Amer 01/01/2015 >60  >60 mL/min Final  . GFR calc Af Amer 01/01/2015 >60  >60 mL/min Final   Comment: (NOTE) The eGFR has been calculated using the CKD EPI equation. This calculation has not been validated in all clinical situations. eGFR's persistently <60 mL/min signify possible Chronic Kidney Disease.   . Anion gap 01/01/2015 8  5 - 15 Final  . LDH 01/01/2015 187  98 - 192 U/L Final  . Uric Acid, Serum 01/01/2015 6.3  2.3 - 6.6 mg/dL Final  . Sed Rate 01/01/2015 58* 0 - 30 mm/hr Final  . Kappa free light chain 01/01/2015 36.76* 3.30 - 19.40 mg/L Final  . Lamda  free light chains 01/01/2015 31.26* 5.71 - 26.30 mg/L Final  . Kappa, lamda light chain ratio 01/01/2015 1.18  0.26 -  1.65 Final   Comment: (NOTE) Performed At: [facility] [place], [place] [id number] Lindon Romp MD [id number]   . Ferritin 01/01/2015 380* 11 - 307 ng/mL Final  . Iron 01/01/2015 31  28 - 170 ug/dL Final  . TIBC 01/01/2015 281  250 - 450 ug/dL Final  . Saturation Ratios 01/01/2015 11  10.4 - 31.8 % Final  . UIBC 01/01/2015 250   Final  . PATH INTERP XXX-IMP 01/01/2015 Comment   Final   No significant immunophenotypic abnormality detected  . CLINICAL INFO 01/01/2015 Comment   Corrected   Comment: (NOTE) Accompanying CBC dated 01-01-15 shows: WBC count 12.8, Hgb 11.7, Neu 6.9, Lym 4.2, Mon 1.1   . Misc Source 01/01/2015 Comment   Final   Peripheral blood  . ASSESSMENT OF LEUKOCYTES 01/01/2015 Comment   Final   Comment: (NOTE) No monoclonal B cell population is detected. kappa:lambda ratio 1.3 There is no loss of, or aberrant expression of, the pan T cell antigens to suggest a neoplastic T cell process. CD4:CD8 ratio 1.9 A small population (2%) of double positive (CD4+/CD8+) T cells is detected. Double positive T cells have been described in association with chronic viral infections, autoimmune disorders, chronic inflammatory disorders, and immunodeficiency states. No circulating blasts are detected. There is no immunophenotypic evidence of abnormal myeloid maturation.   Marland Kitchen % Viable Cells 01/01/2015 Comment   Corrected   84%  . ANALYSIS AND GATING STRATEGY 01/01/2015 Comment   Final   8 color analysis with CD45/SSC  . IMMUNOPHENOTYPING STUDY 01/01/2015 Comment   Final   Comment: (NOTE) CD2       Normal         CD3       Normal CD4       Normal         CD5       Normal CD7       Normal         CD8       Normal CD10      Normal         CD11b     Normal CD13      Normal         CD14      Normal CD16      Normal         CD19       Normal CD20      Normal         CD33      Normal CD34      Normal         CD38      Normal CD45      Normal         CD56      Normal CD57      Normal         CD117     Normal HLA-DR    Normal         KAPPA     Normal LAMBDA    Normal         CD64      Normal   . PATHOLOGIST NAME 01/01/2015 Comment   Final   Theda Sers, M.D.  . COMMENT: 01/01/2015 Comment   Corrected   Comment: (NOTE) Each antibody in this assay was utilized to assess for potential abnormalities of studied cell populations or to characterize identified abnormalities. This test was developed and its performance characteristics determined by [facility].  It  has not been cleared or approved by the U.S. Food and Drug Administration. The FDA has determined that such clearance or approval is not necessary. This test is used for clinical purposes.  It should not be regarded as investigational or for research. Performed At: -[facility] [address] [place], [place] [id number] Nechama Guard MD [id number] Performed At: [facility] [address] [place], [place] [id number] Nechama Guard MD [id number]     Assessment:  GEETA DWORKIN is a 64 y.o. female with diabetes and an MRI of the lumbar spine on 11/12/2014 noting bone marrow heterogeneity which can be consistent with steroids, smoking, and many chronic diseases including multiple myeloma.  She is status post laparascopic cholecystectomy on 12/28/2014 for a several month history of abdominal pain.  She has lost 10 pounds because of her abdominal symptoms.  She has a history of iron deficiency anemia.  EGD and colonoscopy in 2016 were negative. She is status post hysterectomy.  She denies any melena or hematochezia.  Diet is fair.  She has a distant history of smoking.  Mammogram on 08/04/2014 was normal.  She received a steroid injection for her low back pain at the end of September/beginning of October 2016.  SPEP on 11/25/2014 revealed  no monoclonal protein.  Symptomatically, she is recovering from surgery.  She does not appear to have B symptoms.  Exam reveals a questionable fingertip left supraclavicular node and left axillary fullness without a discrete node.  Plan: 1. Discuss lumbar spine MRI and heterogeneity of signal (broad differential).  Discuss weight loss secondary to recent gallbladder disease.  Discuss work-up including blood and urine testing.  2. Labs today:  CBC with diff, CMP, free light chain assay, ESR, ferritin, iron studies, LDH, uric acid, peripheral blood flow. cytometry. 3. Collect 24 hour urine for UPEP and free light chains. 4. RTC in 2 weeks for MD assess and review of work-up.   Lequita Asal, MD  01/01/2015, 8:54 AM

## 2015-01-03 DIAGNOSIS — R948 Abnormal results of function studies of other organs and systems: Secondary | ICD-10-CM | POA: Diagnosis not present

## 2015-01-04 ENCOUNTER — Other Ambulatory Visit: Payer: Self-pay | Admitting: *Deleted

## 2015-01-04 DIAGNOSIS — R937 Abnormal findings on diagnostic imaging of other parts of musculoskeletal system: Secondary | ICD-10-CM

## 2015-01-04 DIAGNOSIS — R634 Abnormal weight loss: Secondary | ICD-10-CM

## 2015-01-05 LAB — UPEP/TP, 24-HR URINE
Albumin, U: 100 %
Alpha 1, Urine: 0 %
Alpha 2, Urine: 0 %
Beta, Urine: 0 %
Gamma Globulin, Urine: 0 %
Total Protein, Urine-Ur/day: 73.2 mg/24 hr (ref 30.0–150.0)
Total Protein, Urine: 6.4 mg/dL
Total Volume: 1750

## 2015-01-06 ENCOUNTER — Encounter: Payer: Self-pay | Admitting: *Deleted

## 2015-01-07 ENCOUNTER — Ambulatory Visit (INDEPENDENT_AMBULATORY_CARE_PROVIDER_SITE_OTHER): Payer: Medicare HMO | Admitting: General Surgery

## 2015-01-07 ENCOUNTER — Encounter: Payer: Self-pay | Admitting: General Surgery

## 2015-01-07 VITALS — BP 145/78 | HR 86 | Temp 98.6°F | Ht 67.0 in | Wt 155.0 lb

## 2015-01-07 DIAGNOSIS — Z4889 Encounter for other specified surgical aftercare: Secondary | ICD-10-CM

## 2015-01-07 NOTE — Patient Instructions (Signed)
If incision in your belly button continues to drain, let our office know and we will bring you back into office to be seen.  Wait 1 week until you get into a bath, hot tub, pool, etc. But you may take showers daily.  You may continue to be sore for the next days, if you have pain please call our office and speak with a nurse.  Be sure that you place sunscreen over your incisions for the next year if this area is exposed to skin.  You may return to work on Monday with restrictions for the next 3 weeks. See the note provided.

## 2015-01-07 NOTE — Progress Notes (Signed)
Outpatient Surgical Follow Up  01/07/2015  Audrey Chan is an 64 y.o. female.   Chief Complaint  Patient presents with  . Routine Post Op    Laparoscopic Cholecystectomy (11/7)- Dr. Marina Gravel    HPI: 64 year old female returns to clinic 10 days status post laparoscopic cholecystectomy. She is doing well. She reports that she had a small amount of bleeding on her close yesterday due to wearing closer to tight. This spontaneously stops and was only a small amount. No drainage since. She denies any fevers, chills, nausea, vomiting, diarrhea, constipation, chest pain, shortness of breath.  Past Medical History  Diagnosis Date  . Allergic rhinitis   . Diabetes type 2, controlled ([place])   . Hypertension   . GERD (gastroesophageal reflux disease)   . Neuropathy ([place])   . Cholelithiasis     Past Surgical History  Procedure Laterality Date  . Abdominal hysterectomy  1972  . Cervical fusion    . Cholecystectomy N/A 12/28/2014    Procedure: LAPAROSCOPIC CHOLECYSTECTOMY;  Surgeon: Sherri Rad, MD;  Location: [facility] ORS;  Service: General;  Laterality: N/A;    Family History  Problem Relation Age of Onset  . Lung cancer Father     Social History:  reports that she quit smoking about 15 years ago. She has quit using smokeless tobacco. She reports that she does not drink alcohol or use illicit drugs.  Allergies: No Known Allergies  Medications reviewed.    ROS  A multipoint review of systems was completed. All pertinent positives negatives within the history of present illness the remainder were negative.  BP 145/78 mmHg  Pulse 86  Temp(Src) 98.6 F (37 C) (Oral)  Ht 5\' 7"  (1.702 m)  Wt 70.308 kg (155 lb)  BMI 24.27 kg/m2  Physical Exam  Gen.: No acute distress Chest: Clear to all sedation Heart: Regular rate and rhythm Abdomen: Soft, nontender, nondistended. Well approximated laparoscopic cholecystectomy incisions without any evidence of erythema or drainage.   No results  found for this or any previous visit (from the past 48 hour(s)). No results found. Pathology reviewed: Chronic cholecystitis with cholelithiasis. Assessment/Plan:  1. Aftercare following surgery Patient doing well. Discussed the signs and symptoms of infection and to return to clinic immediately should they occur. Also discussed appropriate timeframe for pain to dissipate and returning to physical activity. Patient voiced understanding and will follow up on an as-needed basis.     Clayburn Pert  01/07/2015,negative

## 2015-01-15 LAB — COMP PANEL: LEUKEMIA/LYMPHOMA

## 2015-01-15 LAB — KAPPA/LAMBDA LIGHT CHAINS
Kappa free light chain: 36.76 mg/L — ABNORMAL HIGH (ref 3.30–19.40)
Kappa, lambda light chain ratio: 1.18 (ref 0.26–1.65)
Lambda free light chains: 31.26 mg/L — ABNORMAL HIGH (ref 5.71–26.30)

## 2015-01-21 ENCOUNTER — Encounter: Payer: Self-pay | Admitting: Hematology and Oncology

## 2015-01-21 ENCOUNTER — Inpatient Hospital Stay: Payer: Medicare HMO | Attending: Hematology and Oncology | Admitting: Hematology and Oncology

## 2015-01-21 VITALS — BP 144/85 | HR 94 | Temp 97.8°F | Resp 18 | Ht 67.0 in | Wt 155.6 lb

## 2015-01-21 DIAGNOSIS — R634 Abnormal weight loss: Secondary | ICD-10-CM | POA: Diagnosis not present

## 2015-01-21 DIAGNOSIS — Z862 Personal history of diseases of the blood and blood-forming organs and certain disorders involving the immune mechanism: Secondary | ICD-10-CM | POA: Diagnosis not present

## 2015-01-21 DIAGNOSIS — E119 Type 2 diabetes mellitus without complications: Secondary | ICD-10-CM | POA: Diagnosis not present

## 2015-01-21 DIAGNOSIS — Z90710 Acquired absence of both cervix and uterus: Secondary | ICD-10-CM | POA: Diagnosis not present

## 2015-01-21 DIAGNOSIS — R591 Generalized enlarged lymph nodes: Secondary | ICD-10-CM | POA: Insufficient documentation

## 2015-01-21 DIAGNOSIS — I10 Essential (primary) hypertension: Secondary | ICD-10-CM | POA: Diagnosis not present

## 2015-01-21 DIAGNOSIS — Z7984 Long term (current) use of oral hypoglycemic drugs: Secondary | ICD-10-CM

## 2015-01-21 DIAGNOSIS — E279 Disorder of adrenal gland, unspecified: Secondary | ICD-10-CM | POA: Insufficient documentation

## 2015-01-21 DIAGNOSIS — R7989 Other specified abnormal findings of blood chemistry: Secondary | ICD-10-CM | POA: Insufficient documentation

## 2015-01-21 DIAGNOSIS — R937 Abnormal findings on diagnostic imaging of other parts of musculoskeletal system: Secondary | ICD-10-CM | POA: Insufficient documentation

## 2015-01-21 DIAGNOSIS — R590 Localized enlarged lymph nodes: Secondary | ICD-10-CM

## 2015-01-21 DIAGNOSIS — K8020 Calculus of gallbladder without cholecystitis without obstruction: Secondary | ICD-10-CM

## 2015-01-21 DIAGNOSIS — G629 Polyneuropathy, unspecified: Secondary | ICD-10-CM | POA: Diagnosis not present

## 2015-01-21 DIAGNOSIS — R109 Unspecified abdominal pain: Secondary | ICD-10-CM | POA: Diagnosis not present

## 2015-01-21 DIAGNOSIS — R918 Other nonspecific abnormal finding of lung field: Secondary | ICD-10-CM | POA: Insufficient documentation

## 2015-01-21 DIAGNOSIS — Z7982 Long term (current) use of aspirin: Secondary | ICD-10-CM | POA: Insufficient documentation

## 2015-01-21 DIAGNOSIS — Z87891 Personal history of nicotine dependence: Secondary | ICD-10-CM | POA: Diagnosis not present

## 2015-01-21 DIAGNOSIS — Z79899 Other long term (current) drug therapy: Secondary | ICD-10-CM | POA: Diagnosis not present

## 2015-01-21 DIAGNOSIS — K219 Gastro-esophageal reflux disease without esophagitis: Secondary | ICD-10-CM

## 2015-01-21 NOTE — Progress Notes (Signed)
Patient is here for follow-up and lab results. Patient is doing good overall. She had her gallbladder removed on 01/01/15 and she feels so much better.

## 2015-01-23 DIAGNOSIS — R937 Abnormal findings on diagnostic imaging of other parts of musculoskeletal system: Secondary | ICD-10-CM | POA: Diagnosis not present

## 2015-01-25 ENCOUNTER — Other Ambulatory Visit: Payer: Self-pay | Admitting: *Deleted

## 2015-01-25 DIAGNOSIS — R937 Abnormal findings on diagnostic imaging of other parts of musculoskeletal system: Secondary | ICD-10-CM

## 2015-01-26 LAB — FREE K+L LT CHAINS,QN,UR
Free Kappa/Lambda Ratio: 11.49 — ABNORMAL HIGH (ref 2.04–10.37)
Free Lambda Lt Chains,Ur: 2.28 mg/L (ref 0.24–6.66)
Free Lt Chn Excr Rate: 26.2 mg/L — ABNORMAL HIGH (ref 1.35–24.19)
Total Volume: 1250

## 2015-01-28 ENCOUNTER — Ambulatory Visit
Admission: RE | Admit: 2015-01-28 | Discharge: 2015-01-28 | Disposition: A | Discharge status: Home / Self Care | Payer: Medicare HMO | Source: Ambulatory Visit | Attending: Hematology and Oncology | Admitting: Hematology and Oncology

## 2015-01-28 DIAGNOSIS — R599 Enlarged lymph nodes, unspecified: Secondary | ICD-10-CM | POA: Insufficient documentation

## 2015-01-28 DIAGNOSIS — R918 Other nonspecific abnormal finding of lung field: Secondary | ICD-10-CM | POA: Insufficient documentation

## 2015-01-28 DIAGNOSIS — R590 Localized enlarged lymph nodes: Secondary | ICD-10-CM

## 2015-01-31 ENCOUNTER — Encounter: Payer: Self-pay | Admitting: Hematology and Oncology

## 2015-01-31 NOTE — Progress Notes (Signed)
[facility] day:  01/21/2015  Chief Complaint: Audrey Chan is a 64 y.o. female with an abnormal MRI and unintentional weight loss who is seen for review of initial work-up and discussion regarding direction of therapy.  HPI: The patient was last seen in the medical oncology clinic on 01/01/2015.  At that time, she was seen for initial consultation.  Lumbar spine MRI had revealed bone marrow heterogeneity which could be consistent with steroids, smoking, and many chronic diseases including multiple myeloma.  She was status post laparascopic cholecystectomy on 12/28/2014 for a several month history of abdominal pain.  She has lost 10 pounds because of her abdominal symptoms.  Labs on 11/25/2014 serum pressure electrophoresis with no monoclonal spike.  Immunoglobulins consisted of an IgG of 1309, IgA 338, and IgM 64. Iron studies with a saturation of 19% and a TIBC of 287 and a ferritin of 452 (elevated).  Work-up on 01/01/2015 revealed a hematocrit of 36.9, hemoglobin 11.7, MCV 74.9 (low), white count 12,800 with an [place] of 6900.  Absolute lymphocytes count was 4200 (elevated).   Flow cytometry revealed no monoclonal B-cell population. There was no loss of , or aberrant expression of the pan T cell antigens to suggest a neoplastic T-cell process. There was a small population (2%) of double positive CD4/CD8 T cells which have been associated with chronic viral infections, autoimmune disorders, chronic inflammatory disorders, and immunodeficiency states. There were no circulating blasts. There was no immunophenotypic evidence of abnormal myeloid maturation.  Additional labs included an AST of 72 and ALT of 62. LDH was 187 and uric acid 6.2. Sedimentation rate was 58 (high).   Kappa free light chains were 36.76, lambda free light chains 31.226 with a ratio 1.18 (normal).  Ferritin was 389 with an iron saturation of 11% and TIBC of 281.  24 hour UPEP was  negative.  During the interim, she has done well.  Weight is stable.  She denies any B symptoms.  Past Medical History  Diagnosis Date  . Allergic rhinitis   . Diabetes type 2, controlled ([place])   . Hypertension   . GERD (gastroesophageal reflux disease)   . Neuropathy ([place])   . Cholelithiasis     Past Surgical History  Procedure Laterality Date  . Abdominal hysterectomy  1972  . Cervical fusion    . Cholecystectomy N/A 12/28/2014    Procedure: LAPAROSCOPIC CHOLECYSTECTOMY;  Surgeon: Sherri Rad, MD;  Location: [facility] ORS;  Service: General;  Laterality: N/A;    Family History  Problem Relation Age of Onset  . Lung cancer Father     Social History:  reports that she quit smoking about 15 years ago. She has quit using smokeless tobacco. She reports that she does not drink alcohol or use illicit drugs.  The patient's husband is deceased.  She previously smoked less than 1 pack a day beginning at age 67 or 85.  She denies any exposure to radiation or toxins.  The patient is alone today.  Allergies: No Known Allergies  Current Medications: Current Outpatient Prescriptions  Medication Sig Dispense Refill  . albuterol (PROVENTIL HFA;VENTOLIN HFA) 108 (90 BASE) MCG/ACT inhaler Inhale 2 puffs into the lungs every 6 (six) hours as needed for wheezing or shortness of breath.     Marland Kitchen aspirin EC 81 MG tablet Take 81 mg by mouth daily.    Marland Kitchen atorvastatin (LIPITOR) 40 MG tablet Take 40 mg by mouth daily at 6 PM.     .  carboxymethylcellulose (REFRESH PLUS) 0.5 % SOLN 1 drop 3 (three) times daily as needed.    . ferrous sulfate 325 (65 FE) MG tablet Take 325 mg by mouth daily.    . fluticasone (FLONASE) 50 MCG/ACT nasal spray Place 2 sprays into both nostrils daily as needed for allergies or rhinitis.     Marland Kitchen gabapentin (NEURONTIN) 800 MG tablet Take 800 mg by mouth 3 (three) times daily.    . hydrochlorothiazide (HYDRODIURIL) 25 MG tablet Take 25 mg by mouth daily.    . insulin glulisine (APIDRA) 100  UNIT/ML injection Inject into the skin See admin instructions. Continuously via pump    . ipratropium (ATROVENT HFA) 17 MCG/ACT inhaler Inhale 2 puffs into the lungs every 4 (four) hours as needed for wheezing. 1 Inhaler 12  . lisinopril (PRINIVIL,ZESTRIL) 10 MG tablet Take 10 mg by mouth daily.    Marland Kitchen loratadine (CLARITIN) 10 MG tablet Take 10 mg by mouth daily.    . metFORMIN (GLUCOPHAGE-XR) 500 MG 24 hr tablet Take 500 mg by mouth 2 (two) times daily.    . Multiple Vitamin (MULTI-VITAMINS) TABS Take 1 tablet by mouth daily.    . pantoprazole (PROTONIX) 40 MG tablet Take 40 mg by mouth daily.     . ranitidine (ZANTAC) 150 MG capsule Take 1 capsule (150 mg total) by mouth 2 (two) times daily. 28 capsule 0  . VOLTAREN 1 % GEL Apply 2 g topically daily as needed.      No current facility-administered medications for this visit.    Review of Systems:  GENERAL:  Energy level is good.  No fevers or sweats.  Weight stable. PERFORMANCE STATUS (ECOG):  1 HEENT:  No visual changes, runny nose, sore throat, mouth sores or tenderness. Lungs: Shortness of breath with exertion.  No cough.  No hemoptysis. Cardiac:  No chest pain, palpitations, orthopnea, or PND. GI:  Recovered from surgery.  No nausea, vomiting, diarrhea, constipation, melena or hematochezia. GU:  No urgency, frequency, dysuria, or hematuria. Musculoskeletal:  No back pain.  Left knee pain.  Arthritis.  No muscle tenderness. Extremities:  No pain or swelling. Skin:  No rashes or skin changes. Neuro:  No headache, numbness or weakness, balance or coordination issues. Endocrine:  Diabetes.  No thyroid issues, hot flashes or night sweats. Psych:  No mood changes, depression or anxiety. Pain:  No focal pain. Review of systems:  All other systems reviewed and found to be negative.  Physical Exam: Blood pressure 144/85, pulse 94, temperature 97.8 F (36.6 C), temperature source Tympanic, resp. rate 18, height $RemoveBe'5\' 7"'wUTfbhpaY$  (1.702 m), weight 155  lb 10.3 oz (70.6 kg). GENERAL:  Well developed, well nourished, sitting comfortably in the exam room in no acute distress. MENTAL STATUS:  Alert and oriented to person, place and time. HEAD:  Short black hair.  Normocephalic, atraumatic, face symmetric, no Cushingoid features. EYES:  Glasses.  Brown eyes. No conjunctivitis or scleral icterus. ABDOMEN:  Laparascopic surgery incisons.  Soft, non-tender, with active bowel sounds, and no hepatosplenomegaly.  No masses. SKIN:  No rashes, ulcers or lesions. EXTREMITIES: No edema, no skin discoloration or tenderness.  No palpable cords. LYMPH NODES:  No palpable cervical or inguinal adenopathy.  Fingertip left supraclavicular node and tiny left axillary node.   NEUROLOGICAL: Unremarkable. PSYCH:  Appropriate.  No visits with results within 3 Day(s) from this visit. Latest known visit with results is:  Orders Only on 01/04/2015  Component Date Value Ref Range Status  . Total  Protein, Urine 01/03/2015 6.4  Not Estab. mg/dL Final  . Total Protein, Urine-Ur/day 01/03/2015 73.2  30.0 - 150.0 mg/24 hr Final  . Albumin, U 01/03/2015 100.0   Final  . Alpha 1, Urine 01/03/2015 0.0   Final  . Alpha 2, Urine 01/03/2015 0.0   Final  . Beta, Urine 01/03/2015 0.0   Final  . Gamma Globulin, Urine 01/03/2015 0.0   Final  . M-spike, % 01/03/2015 Not Observed  Not Observed % Final  . PLEASE NOTE: 01/03/2015 Comment   Final   Comment: (NOTE) Protein electrophoresis scan will follow via computer, mail, or courier delivery. Performed At: [facility] [place], [place] [id number] Lindon Romp MD [id number]   . Total Volume 01/03/2015 1750   Corrected   CORRECTED ON 11/14 AT 1146: PREVIOUSLY REPORTED AS 1725, CORRECTED ON 11/14 AT 1144: PREVIOUSLY REPORTED AS  URINE    Assessment:  Audrey Chan is a 64 y.o. female with diabetes and an MRI of the lumbar spine on 11/12/2014 revealing bone marrow heterogeneity which can be  consistent with steroids, smoking, and many chronic diseases including multiple myeloma.  She is status post laparascopic cholecystectomy on 12/28/2014 for a several month history of abdominal pain.  She lost 10 pounds because of her abdominal symptoms.  Weight is now stable.  She has a history of iron deficiency anemia.  EGD and colonoscopy in 2016 were negative. She is status post hysterectomy.  She denies any melena or hematochezia.  Diet is fair.  She has a distant history of smoking.  Mammogram on 08/04/2014 was normal.  SPEP on 11/25/2014 revealed no monoclonal protein.  Labs on 01/01/2015 revealed a normal LDH, uric acid, free light chain ratio, ferritin, and iron studies. 24 hour UPEP was negative.  Sedimentation rate was 58 (high). AST was 72 and ALT 62  Work-up on 01/01/2015 revealed a hematocrit of 36.9, hemoglobin 11.7, MCV 74.9 (low), white count 12,800 with an ANC of 6900.  Absolute lymphocytes count was 4200 (elevated). Flow cytometry was negative except for a small population (2%) of double positive CD4/CD8 T cells (associated with chronic viral infections, autoimmune disorders, chronic inflammatory disorders, and immunodeficiency states).   Symptomatically, she is doing well.  Exam reveals tiny left supraclavicular and left axillary nodes.  Plan: 1. Review work-up. 2. Discuss recollect 24 hour urine for free light chains. 3. Chest CT no contrast: assess small left supraclavicular and axillary adenopathy. 4. RTC after above.   Lequita Asal, MD  01/21/2015

## 2015-02-01 ENCOUNTER — Ambulatory Visit: Payer: Medicare HMO | Admitting: Hematology and Oncology

## 2015-02-01 ENCOUNTER — Inpatient Hospital Stay: Payer: Medicare HMO | Admitting: Hematology and Oncology

## 2015-02-01 ENCOUNTER — Inpatient Hospital Stay (HOSPITAL_BASED_OUTPATIENT_CLINIC_OR_DEPARTMENT_OTHER): Payer: Medicare HMO | Admitting: Hematology and Oncology

## 2015-02-01 VITALS — BP 124/77 | HR 74 | Temp 97.1°F | Resp 18 | Ht 67.0 in | Wt 154.5 lb

## 2015-02-01 DIAGNOSIS — Z90710 Acquired absence of both cervix and uterus: Secondary | ICD-10-CM

## 2015-02-01 DIAGNOSIS — R591 Generalized enlarged lymph nodes: Secondary | ICD-10-CM

## 2015-02-01 DIAGNOSIS — K8020 Calculus of gallbladder without cholecystitis without obstruction: Secondary | ICD-10-CM

## 2015-02-01 DIAGNOSIS — R937 Abnormal findings on diagnostic imaging of other parts of musculoskeletal system: Secondary | ICD-10-CM | POA: Diagnosis not present

## 2015-02-01 DIAGNOSIS — Z87891 Personal history of nicotine dependence: Secondary | ICD-10-CM

## 2015-02-01 DIAGNOSIS — I10 Essential (primary) hypertension: Secondary | ICD-10-CM

## 2015-02-01 DIAGNOSIS — Z79899 Other long term (current) drug therapy: Secondary | ICD-10-CM

## 2015-02-01 DIAGNOSIS — R7989 Other specified abnormal findings of blood chemistry: Secondary | ICD-10-CM | POA: Diagnosis not present

## 2015-02-01 DIAGNOSIS — Z7984 Long term (current) use of oral hypoglycemic drugs: Secondary | ICD-10-CM

## 2015-02-01 DIAGNOSIS — E119 Type 2 diabetes mellitus without complications: Secondary | ICD-10-CM

## 2015-02-01 DIAGNOSIS — Z862 Personal history of diseases of the blood and blood-forming organs and certain disorders involving the immune mechanism: Secondary | ICD-10-CM

## 2015-02-01 DIAGNOSIS — R918 Other nonspecific abnormal finding of lung field: Secondary | ICD-10-CM

## 2015-02-01 DIAGNOSIS — Z7982 Long term (current) use of aspirin: Secondary | ICD-10-CM

## 2015-02-01 DIAGNOSIS — R634 Abnormal weight loss: Secondary | ICD-10-CM | POA: Diagnosis not present

## 2015-02-01 DIAGNOSIS — G629 Polyneuropathy, unspecified: Secondary | ICD-10-CM

## 2015-02-01 DIAGNOSIS — R109 Unspecified abdominal pain: Secondary | ICD-10-CM

## 2015-02-01 DIAGNOSIS — E279 Disorder of adrenal gland, unspecified: Secondary | ICD-10-CM

## 2015-02-01 DIAGNOSIS — K219 Gastro-esophageal reflux disease without esophagitis: Secondary | ICD-10-CM

## 2015-02-01 NOTE — Progress Notes (Signed)
[facility] day:  02/01/2015   Chief Complaint: Audrey Chan is a 64 y.o. female with an abnormal MRI and unintentional weight loss who is seen for of interval labs and chest CT.  HPI: The patient was last seen in the medical oncology clinic on 01/21/2015.  At that time, work-up was reviewed.  CBC was normal except for a slight lymphocytosis. Flow cytometry was negative except for a small population (2%) of double positive CD4/CD8 T cells (associated with chronic viral infections, autoimmune disorders, chronic inflammatory disorders, and immunodeficiency states). SPEP revealed no monoclonal protein. Normal labs included LDH, uric acid, free light chain ratio, ferritin, and iron studies. 24 hour UPEP was negative. Sedimentation rate was 58 (high). AST was 72 and ALT 62.  A chest CT was ordered.  Chest CT on 01/28/2015 revealed no supraclavicular or axillary lymphadenopathy or mass.  There was no definite breast masses.  There was emphysematous and bronchitic type lung changes.  There were several small bilateral pulmonary nodules.  The largest nodule is a sub-solid nodule measuring 8 mm.  There were 2 nodules associated with the left adrenal gland could reflect lipid poor adenomas.  Mammogram on 08/04/2014 was normal.  During the interim, she has done well.  She denies any complaints.  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  Past Medical History  Diagnosis Date  . Allergic rhinitis   . Diabetes type 2, controlled ([place])   . Hypertension   . GERD (gastroesophageal reflux disease)   . Neuropathy ([place])   . Cholelithiasis     Past Surgical History  Procedure Laterality Date  . Abdominal hysterectomy  1972  . Cervical fusion    . Cholecystectomy N/A 12/28/2014    Procedure: LAPAROSCOPIC CHOLECYSTECTOMY;  Surgeon: Sherri Rad, MD;  Location: [facility] ORS;  Service: General;  Laterality: N/A;     Family History  Problem Relation Age of Onset  . Lung cancer Father     Social History:  reports that she quit smoking about 15 years ago. She has quit using smokeless tobacco. She reports that she does not drink alcohol or use illicit drugs.  The patient's husband is deceased.  She previously smoked less than 1 pack a day beginning at age 58 or 67.  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  She denies any exposure to radiation or toxins.  The patient is alone today.  Allergies: No Known Allergies  Current Medications: Current Outpatient Prescriptions  Medication Sig Dispense Refill  . albuterol (PROVENTIL HFA;VENTOLIN HFA) 108 (90 BASE) MCG/ACT inhaler Inhale 2 puffs into the lungs every 6 (six) hours as needed for wheezing or shortness of breath.     Marland Kitchen aspirin EC 81 MG tablet Take 81 mg by mouth daily.    Marland Kitchen atorvastatin (LIPITOR) 40 MG tablet Take 40 mg by mouth daily at 6 PM.     . carboxymethylcellulose (REFRESH PLUS) 0.5 % SOLN 1 drop 3 (three) times daily as needed.    . ferrous sulfate 325 (65 FE) MG tablet Take 325 mg by mouth daily.    . fluticasone (FLONASE) 50 MCG/ACT nasal spray Place 2 sprays into both nostrils daily as needed for allergies or rhinitis.     Marland Kitchen gabapentin (NEURONTIN) 800 MG tablet Take 800 mg by mouth 3 (three) times daily.    . hydrochlorothiazide (HYDRODIURIL) 25 MG tablet Take 25 mg by mouth daily.    Marland Kitchen  insulin glulisine (APIDRA) 100 UNIT/ML injection Inject into the skin See admin instructions. Continuously via pump    . ipratropium (ATROVENT HFA) 17 MCG/ACT inhaler Inhale 2 puffs into the lungs every 4 (four) hours as needed for wheezing. 1 Inhaler 12  . lisinopril (PRINIVIL,ZESTRIL) 10 MG tablet Take 10 mg by mouth daily.    . metFORMIN (GLUCOPHAGE-XR) 500 MG 24 hr tablet Take 500 mg by mouth 2 (two) times daily.    . Multiple Vitamin (MULTI-VITAMINS) TABS Take 1 tablet by mouth daily.    . pantoprazole (PROTONIX) 40 MG  tablet Take 40 mg by mouth daily.     . VOLTAREN 1 % GEL Apply 2 g topically daily as needed.      No current facility-administered medications for this visit.    Review of Systems:  GENERAL:  Energy level is good.  No fevers or sweats.  Weight stable. PERFORMANCE STATUS (ECOG):  1 HEENT:  No visual changes, runny nose, sore throat, mouth sores or tenderness. Lungs: Shortness of breath with exertion.  No cough.  No hemoptysis. Cardiac:  No chest pain, palpitations, orthopnea, or PND. GI:  Recovered from surgery.  No nausea, vomiting, diarrhea, constipation, melena or hematochezia. GU:  No urgency, frequency, dysuria, or hematuria. Musculoskeletal:  No back pain.  Left knee pain.  Arthritis.  No muscle tenderness. Extremities:  No pain or swelling. Skin:  No rashes or skin changes. Neuro:  No headache, numbness or weakness, balance or coordination issues. Endocrine:  Diabetes.  No thyroid issues, hot flashes or night sweats. Psych:  No mood changes, depression or anxiety. Pain:  No focal pain. Review of systems:  All other systems reviewed and found to be negative.  Physical Exam: Blood pressure 124/77, pulse 74, temperature 97.1 F (36.2 C), temperature source Tympanic, resp. rate 18, height _0  (1.702 m), weight 154 lb 8.7 oz (70.1 kg). GENERAL:  Well developed, well nourished, sitting comfortably in the exam room in no acute distress. MENTAL STATUS:  Alert and oriented to person, place and time. HEAD:  Short black hair.  Normocephalic, atraumatic, face symmetric, no Cushingoid features. EYES:  Glasses.  Brown eyes. No conjunctivitis or scleral icterus.  NEUROLOGICAL: Unremarkable. PSYCH:  Appropriate.  No visits with results within 3 Day(s) from this visit. Latest known visit with results is:  Orders Only on 01/25/2015  Component Date Value Ref Range Status  . Free Lt Chn Excr Rate 01/23/2015 26.20* 1.35 - 24.19 mg/L Final  . Free Lambda Lt Chains,Ur 01/23/2015 2.28  0.24 -  6.66 mg/L Final   **Results verified by repeat testing**  . Free Kappa/Lambda Ratio 01/23/2015 11.49* 2.04 - 10.37 Final   Comment: (NOTE) Performed At: [facility] [place], [place] [id number] Lindon Romp MD [id number]   . Total Volume 01/23/2015 1250   Final    Assessment:  GLENNDA WEATHERHOLTZ is a 64 y.o. female with diabetes and an MRI of the lumbar spine on 11/12/2014 revealing bone marrow heterogeneity which can be consistent with steroids, smoking, and many chronic diseases including multiple myeloma.  She is status post laparascopic cholecystectomy on 12/28/2014 for a several month history of abdominal pain.  She lost 10 pounds because of her abdominal symptoms.  Weight is now stable.  She has a history of iron deficiency anemia.  EGD and colonoscopy in 2016 were negative. She is status post hysterectomy.  She denies any melena or hematochezia.  Diet is fair.  She has a distant history  of smoking.  Mammogram on 08/04/2014 was normal.  SPEP on 11/25/2014 revealed no monoclonal protein.  Labs on 01/01/2015 revealed a normal LDH, uric acid, free light chain ratio, ferritin, and iron studies. 24 hour UPEP was negative.  24 hour urine on 01/23/2015 revealed kappa free light chains 26.2, lambda free light chains 2.28 and a ratio of 11.49 (2.04-10.37). Sedimentation rate was 58 (high). AST was 72 and ALT 62  Work-up on 01/01/2015 revealed a hematocrit of 36.9, hemoglobin 11.7, MCV 74.9 (low), white count 12,800 with an ANC of 6900.  Absolute lymphocytes count was 4200 (elevated). Flow cytometry was negative except for a small population (2%) of double positive CD4/CD8 T cells (associated with chronic viral infections, autoimmune disorders, chronic inflammatory disorders, and immunodeficiency states).    Chest CT on 01/28/2015 revealed emphysematous and bronchitic type lung changes.  There were several small bilateral pulmonary nodules (largest 8 mm).  There were 2  nodules associated with the left adrenal gland (? lipid poor adenomas).  Symptomatically, she is doing well.  Exam is stable.  Plan: 1.  Review 24 hour urine and chest CT. 2.  Schedule chest CT in 3 months:  follow-up nodules. 3.  RTC after above.   Lequita Asal, MD  02/01/2015, 4:26 PM

## 2015-02-11 ENCOUNTER — Ambulatory Visit: Payer: Medicare HMO | Admitting: Hematology and Oncology

## 2015-03-11 ENCOUNTER — Telehealth: Payer: Self-pay

## 2015-03-11 NOTE — Telephone Encounter (Signed)
Returned pt's phone call regarding the need for the follow up of a chest CT.  Explained to pt per MD that pt had no adenopathy and needed follow up for nodules that were on last scan per recommendation of radiology and MD.  Pt agreed to follow up.  Pt would like recent labs faxed to [facility].  No other concerns noted. Pt verbalized an understanding

## 2015-04-01 ENCOUNTER — Encounter: Payer: Self-pay | Admitting: Hematology and Oncology

## 2015-04-28 ENCOUNTER — Ambulatory Visit: Admission: RE | Admit: 2015-04-28 | Payer: Medicare PPO | Source: Ambulatory Visit

## 2015-04-28 ENCOUNTER — Ambulatory Visit
Admission: RE | Admit: 2015-04-28 | Discharge: 2015-04-28 | Disposition: A | Discharge status: Home / Self Care | Payer: Medicare PPO | Source: Ambulatory Visit | Attending: Hematology and Oncology | Admitting: Hematology and Oncology

## 2015-04-28 DIAGNOSIS — J439 Emphysema, unspecified: Secondary | ICD-10-CM | POA: Diagnosis not present

## 2015-04-28 DIAGNOSIS — R918 Other nonspecific abnormal finding of lung field: Secondary | ICD-10-CM | POA: Insufficient documentation

## 2015-04-28 DIAGNOSIS — I2510 Atherosclerotic heart disease of native coronary artery without angina pectoris: Secondary | ICD-10-CM | POA: Diagnosis not present

## 2015-04-30 ENCOUNTER — Inpatient Hospital Stay: Payer: Medicare PPO | Admitting: Hematology and Oncology

## 2015-04-30 ENCOUNTER — Inpatient Hospital Stay: Payer: Medicare PPO

## 2015-05-06 ENCOUNTER — Other Ambulatory Visit: Payer: Medicare HMO

## 2015-05-06 ENCOUNTER — Ambulatory Visit: Payer: Medicare HMO | Admitting: Hematology and Oncology

## 2015-05-21 ENCOUNTER — Inpatient Hospital Stay: Payer: Medicare PPO | Attending: Hematology and Oncology

## 2015-05-21 ENCOUNTER — Inpatient Hospital Stay (HOSPITAL_BASED_OUTPATIENT_CLINIC_OR_DEPARTMENT_OTHER): Payer: Medicare PPO | Admitting: Hematology and Oncology

## 2015-05-21 VITALS — BP 159/92 | HR 100 | Temp 96.8°F | Resp 17 | Ht 67.0 in | Wt 153.6 lb

## 2015-05-21 DIAGNOSIS — I10 Essential (primary) hypertension: Secondary | ICD-10-CM | POA: Diagnosis not present

## 2015-05-21 DIAGNOSIS — D219 Benign neoplasm of connective and other soft tissue, unspecified: Secondary | ICD-10-CM

## 2015-05-21 DIAGNOSIS — D72820 Lymphocytosis (symptomatic): Secondary | ICD-10-CM | POA: Diagnosis not present

## 2015-05-21 DIAGNOSIS — Z7984 Long term (current) use of oral hypoglycemic drugs: Secondary | ICD-10-CM | POA: Diagnosis not present

## 2015-05-21 DIAGNOSIS — Z87891 Personal history of nicotine dependence: Secondary | ICD-10-CM | POA: Diagnosis not present

## 2015-05-21 DIAGNOSIS — D509 Iron deficiency anemia, unspecified: Secondary | ICD-10-CM | POA: Diagnosis not present

## 2015-05-21 DIAGNOSIS — Z79899 Other long term (current) drug therapy: Secondary | ICD-10-CM | POA: Insufficient documentation

## 2015-05-21 DIAGNOSIS — Z794 Long term (current) use of insulin: Secondary | ICD-10-CM | POA: Diagnosis not present

## 2015-05-21 DIAGNOSIS — R937 Abnormal findings on diagnostic imaging of other parts of musculoskeletal system: Secondary | ICD-10-CM

## 2015-05-21 DIAGNOSIS — E119 Type 2 diabetes mellitus without complications: Secondary | ICD-10-CM | POA: Diagnosis not present

## 2015-05-21 DIAGNOSIS — Z7982 Long term (current) use of aspirin: Secondary | ICD-10-CM | POA: Insufficient documentation

## 2015-05-21 DIAGNOSIS — G629 Polyneuropathy, unspecified: Secondary | ICD-10-CM | POA: Diagnosis not present

## 2015-05-21 DIAGNOSIS — I2510 Atherosclerotic heart disease of native coronary artery without angina pectoris: Secondary | ICD-10-CM

## 2015-05-21 DIAGNOSIS — R918 Other nonspecific abnormal finding of lung field: Secondary | ICD-10-CM

## 2015-05-21 DIAGNOSIS — E279 Disorder of adrenal gland, unspecified: Secondary | ICD-10-CM | POA: Diagnosis not present

## 2015-05-21 NOTE — Progress Notes (Signed)
Pt reports she has some drainage with an off white color when it comes up.  Otherwise no changes since last visit

## 2015-05-21 NOTE — Progress Notes (Signed)
[facility]-  Cancer Center  Clinic day:  05/21/2015  Chief Complaint: Audrey Chan is a 65 y.o. female with an abnormal MRI and small pulmonary nodules who is seen for 3 month assessment.  HPI: The patient was last seen in the medical oncology clinic on 01/21/2015.  At that time, work-up was reviewed.  CBC was normal except for a slight lymphocytosis. Flow cytometry was negative except for a small population (2%) of double positive CD4/CD8 T cells (associated with chronic viral infections, autoimmune disorders, chronic inflammatory disorders, and immunodeficiency states). SPEP revealed no monoclonal protein. Normal labs included LDH, uric acid, free light chain ratio, ferritin, and iron studies. 24 hour UPEP was negative. Sedimentation rate was 58 (high). AST was 72 and ALT 62.  A chest CT was ordered.  Chest CT on 01/28/2015 revealed no supraclavicular or axillary lymphadenopathy or mass.  There was no definite breast masses.  There was emphysematous and bronchitic type lung changes.  There were several small bilateral pulmonary nodules.  The largest nodule is a sub-solid nodule measuring 8 mm.  There were 2 nodules associated with the left adrenal gland could reflect lipid poor adenomas.  Mammogram on 08/04/2014 was normal.  Chest CT on 04/28/2015 revealed no change in numerous small pulmonary nodules scattered throughout the lungs bilaterally. The majority of these were 5 mm or less in size. Amongst the largest nodules, these were predominantly subsolid in appearance, measuring up 8 mm in the medial left upper lobe. These were favored to be benign, likely a combination of subpleural lymph nodes, and areas of post infectious or inflammatory scarring.  Repeat noncontrast chest CT was recommended every 2 years entirely full 5 years of stability has been documented. There was diffuse bronchial wall thickening with mild centrilobular and paraseptal emphysema.  There was  atherosclerosis, including right coronary artery disease.  During the interim, she has done well.  She had a minor cold.  She is eating well.  Weight is stable.     Past Medical History  Diagnosis Date  . Allergic rhinitis   . Diabetes type 2, controlled (HCC)   . Hypertension   . GERD (gastroesophageal reflux disease)   . Neuropathy (HCC)   . Cholelithiasis     Past Surgical History  Procedure Laterality Date  . Abdominal hysterectomy  1972  . Cervical fusion    . Cholecystectomy N/A 12/28/2014    Procedure: LAPAROSCOPIC CHOLECYSTECTOMY;  Surgeon: Mark Bird, MD;  Location: [facility] ORS;  Service: General;  Laterality: N/A;    Family History  Problem Relation Age of Onset  . Lung cancer Father     Social History:  reports that she quit smoking about 16 years ago. She has quit using smokeless tobacco. She reports that she does not drink alcohol or use illicit drugs.  The patient's husband is deceased.  She previously smoked less than 1 pack a day beginning at age 19 or 20.  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  She denies any exposure to radiation or toxins.  The patient is alone today.  Allergies: No Known Allergies  Current Medications: Current Outpatient Prescriptions  Medication Sig Dispense Refill  . albuterol (PROVENTIL HFA;VENTOLIN HFA) 108 (90 BASE) MCG/ACT inhaler Inhale 2 puffs into the lungs every 6 (six) hours as needed for wheezing or shortness of breath.     . aspirin EC 81 MG tablet Take 81 mg by mouth daily.    . atorvastatin (LIPITOR) 40   MG tablet Take 40 mg by mouth daily at 6 PM.     . ferrous sulfate 325 (65 FE) MG tablet Take 325 mg by mouth daily.    . fluticasone (FLONASE) 50 MCG/ACT nasal spray Place 2 sprays into both nostrils daily as needed for allergies or rhinitis.     . gabapentin (NEURONTIN) 800 MG tablet Take 800 mg by mouth 3 (three) times daily.    . hydrochlorothiazide (HYDRODIURIL) 25 MG tablet Take 25 mg by  mouth daily.    . insulin glulisine (APIDRA) 100 UNIT/ML injection Inject into the skin See admin instructions. Continuously via pump    . lisinopril (PRINIVIL,ZESTRIL) 10 MG tablet Take 10 mg by mouth daily.    . metFORMIN (GLUCOPHAGE-XR) 500 MG 24 hr tablet Take 500 mg by mouth 2 (two) times daily.    . Multiple Vitamin (MULTI-VITAMINS) TABS Take 1 tablet by mouth daily.    . VOLTAREN 1 % GEL Apply 2 g topically daily as needed.     . pantoprazole (PROTONIX) 40 MG tablet Take 40 mg by mouth daily. Reported on 05/21/2015    . ranitidine (ZANTAC) 150 MG tablet      No current facility-administered medications for this visit.    Review of Systems:  GENERAL:  Energy level is good.  No fevers or sweats.  Weight stable (152-153 pounds). PERFORMANCE STATUS (ECOG):  1 HEENT:  No visual changes, runny nose, sore throat, mouth sores or tenderness. Lungs: Shortness of breath with exertion.  No cough.  No hemoptysis. Cardiac:  No chest pain, palpitations, orthopnea, or PND. GI:  No nausea, vomiting, diarrhea, constipation, melena or hematochezia. GU:  No urgency, frequency, dysuria, or hematuria. Musculoskeletal:  No back pain.  Left knee pain.  Arthritis.  No muscle tenderness. Extremities:  No pain or swelling. Skin:  No rashes or skin changes. Neuro:  No headache, numbness or weakness, balance or coordination issues. Endocrine:  Diabetes.  No thyroid issues, hot flashes or night sweats. Psych:  No mood changes, depression or anxiety. Pain:  No focal pain. Review of systems:  All other systems reviewed and found to be negative.  Physical Exam: Blood pressure 159/92, pulse 100, temperature 96.8 F (36 C), temperature source Tympanic, resp. rate 17, height 5' 7" (1.702 m), weight 153 lb 8.8 oz (69.65 kg). GENERAL:  Well developed, well nourished, sitting comfortably in the exam room in no acute distress. MENTAL STATUS:  Alert and oriented to person, place and time. HEAD:  Short black hair.   Normocephalic, atraumatic, face symmetric, no Cushingoid features. EYES:  Glasses.  Brown eyes. Pupils equal round and reactive to light and accomodation.  No conjunctivitis or scleral icterus. ENT:  Oropharynx clear without lesion.  Tongue normal. Mucous membranes moist.  RESPIRATORY:  Clear to auscultation without rales, wheezes or rhonchi. CARDIOVASCULAR:  Regular rate and rhythm without murmur, rub or gallop. ABDOMEN:  Soft, non-tender, with active bowel sounds, and no hepatosplenomegaly.  No masses. SKIN:  No rashes, ulcers or lesions. EXTREMITIES: No edema, no skin discoloration or tenderness.  No palpable cords. LYMPH NODES: No palpable cervical, supraclavicular, axillary or inguinal adenopathy  NEUROLOGICAL: Unremarkable. PSYCH:  Appropriate.    No visits with results within 3 Day(s) from this visit. Latest known visit with results is:  Orders Only on 01/25/2015  Component Date Value Ref Range Status  . Free Lt Chn Excr Rate 01/23/2015 26.20* 1.35 - 24.19 mg/L Final  . Free Lambda Lt Chains,Ur 01/23/2015 2.28  0.24 -   6.66 mg/L Final   **Results verified by repeat testing**  . Free Kappa/Lambda Ratio 01/23/2015 11.49* 2.04 - 10.37 Final   Comment: (NOTE) Performed At: [facility] [address] [place], [place] [id number] [place] William F MD Ph:[phone]   . Total Volume 01/23/2015 1250   Final    Assessment:  Violia W Parkerson is a 65 y.o. female with diabetes and an MRI of the lumbar spine on 11/12/2014 revealing bone marrow heterogeneity which can be consistent with steroids, smoking, and many chronic diseases including multiple myeloma.  She is status post laparascopic cholecystectomy on 12/28/2014 for a several month history of abdominal pain.  She lost 10 pounds because of her abdominal symptoms.  Weight is now stable.  She has a history of iron deficiency anemia.  EGD and colonoscopy in 2016 were negative. She is status post hysterectomy.  She denies any  melena or hematochezia.  Diet is fair.  She has a distant history of smoking.  Mammogram on 08/04/2014 was normal.  SPEP on 11/25/2014 revealed no monoclonal protein.  Labs on 01/01/2015 revealed a normal LDH, uric acid, free light chain ratio, ferritin, and iron studies. 24 hour UPEP was negative.  24 hour urine on 01/23/2015 revealed kappa free light chains 26.2, lambda free light chains 2.28 and a ratio of 11.49 (2.04-10.37). Sedimentation rate was 58 (high). AST was 72 and ALT 62  Work-up on 01/01/2015 revealed a hematocrit of 36.9, hemoglobin 11.7, MCV 74.9 (low), white count 12,800 with an ANC of 6900.  Absolute lymphocytes count was 4200 (elevated). Flow cytometry was negative except for a small population (2%) of double positive CD4/CD8 T cells (associated with chronic viral infections, autoimmune disorders, chronic inflammatory disorders, and immunodeficiency states).    Chest CT on 01/28/2015 revealed emphysematous and bronchitic type lung changes.  There were several small bilateral pulmonary nodules (largest 8 mm).  There were 2 nodules associated with the left adrenal gland (? lipid poor adenomas).  Chest CT on 04/28/2015 revealed no change in numerous small pulmonary nodules scattered throughout the lungs bilaterally. The majority of these were 5 mm or less in size. Amongst the largest nodules, these were predominantly subsolid in appearance, measuring up 8 mm in the medial left upper lobe. These were favored to be benign.  Ongoing follow-up was recommended.  Symptomatically, she is doing well.  Weight is stable.  Exam is stable.  Plan: 1.  Review chest CT. 2.  Discuss plan for ongoing follow-up chest CT per radiology. 3.  Discuss follow-up in clinic if any concerns. 4.  RTC prn.   Melissa C Corcoran, MD  05/21/2015, 3:51 PM  

## 2015-06-21 ENCOUNTER — Encounter: Payer: Self-pay | Admitting: Hematology and Oncology

## 2015-12-10 ENCOUNTER — Other Ambulatory Visit: Payer: Self-pay | Admitting: Neurological Surgery

## 2015-12-10 DIAGNOSIS — R200 Anesthesia of skin: Secondary | ICD-10-CM

## 2015-12-23 ENCOUNTER — Ambulatory Visit: Admission: RE | Admit: 2015-12-23 | Payer: Medicare PPO | Source: Ambulatory Visit

## 2015-12-27 ENCOUNTER — Ambulatory Visit (HOSPITAL_COMMUNITY)
Admission: RE | Admit: 2015-12-27 | Discharge: 2015-12-27 | Disposition: A | Discharge status: Home / Self Care | Payer: Medicare PPO | Source: Ambulatory Visit | Attending: Neurological Surgery | Admitting: Neurological Surgery

## 2015-12-27 DIAGNOSIS — R200 Anesthesia of skin: Secondary | ICD-10-CM | POA: Diagnosis present

## 2015-12-27 DIAGNOSIS — M47896 Other spondylosis, lumbar region: Secondary | ICD-10-CM | POA: Insufficient documentation

## 2015-12-27 DIAGNOSIS — M5126 Other intervertebral disc displacement, lumbar region: Secondary | ICD-10-CM | POA: Diagnosis not present

## 2015-12-27 DIAGNOSIS — D1809 Hemangioma of other sites: Secondary | ICD-10-CM | POA: Diagnosis not present

## 2015-12-27 DIAGNOSIS — M48061 Spinal stenosis, lumbar region without neurogenic claudication: Secondary | ICD-10-CM | POA: Diagnosis not present

## 2016-01-06 ENCOUNTER — Ambulatory Visit: Payer: Medicare PPO

## 2016-02-29 ENCOUNTER — Encounter: Payer: Self-pay | Admitting: Anesthesiology

## 2016-02-29 ENCOUNTER — Ambulatory Visit: Payer: PPO | Attending: Anesthesiology | Admitting: Anesthesiology

## 2016-02-29 VITALS — BP 138/77 | HR 88 | Temp 97.3°F | Resp 16 | Ht 67.0 in | Wt 153.0 lb

## 2016-02-29 DIAGNOSIS — Z87891 Personal history of nicotine dependence: Secondary | ICD-10-CM | POA: Insufficient documentation

## 2016-02-29 DIAGNOSIS — M5136 Other intervertebral disc degeneration, lumbar region: Secondary | ICD-10-CM | POA: Insufficient documentation

## 2016-02-29 DIAGNOSIS — M4696 Unspecified inflammatory spondylopathy, lumbar region: Secondary | ICD-10-CM | POA: Diagnosis not present

## 2016-02-29 DIAGNOSIS — M47816 Spondylosis without myelopathy or radiculopathy, lumbar region: Secondary | ICD-10-CM

## 2016-02-29 DIAGNOSIS — I10 Essential (primary) hypertension: Secondary | ICD-10-CM | POA: Diagnosis not present

## 2016-02-29 DIAGNOSIS — M5441 Lumbago with sciatica, right side: Secondary | ICD-10-CM | POA: Diagnosis not present

## 2016-02-29 DIAGNOSIS — M539 Dorsopathy, unspecified: Secondary | ICD-10-CM | POA: Diagnosis not present

## 2016-02-29 DIAGNOSIS — G8929 Other chronic pain: Secondary | ICD-10-CM | POA: Diagnosis not present

## 2016-02-29 DIAGNOSIS — M4686 Other specified inflammatory spondylopathies, lumbar region: Secondary | ICD-10-CM | POA: Insufficient documentation

## 2016-02-29 DIAGNOSIS — M5387 Other specified dorsopathies, lumbosacral region: Secondary | ICD-10-CM

## 2016-02-29 DIAGNOSIS — M5386 Other specified dorsopathies, lumbar region: Secondary | ICD-10-CM

## 2016-02-29 NOTE — Progress Notes (Signed)
Subjective:  Patient ID: Audrey Chan, female    DOB: 08-Aug-1950  Age: 66 y.o. MRN: [id number]  CC: Back Pain (lower); Hip Pain (bilateral); and Leg Pain (bilateral)      PROCEDURE:    HPI TOCARRA SPIKES presents for a new patient evaluation. She has a 2 year history of low back pain with radiation into the posterior leg and down the leg. Presently her right leg bothers her more than the left however she does have numbness and tingling affecting the left lateral foot. The pain she is describing is a maximum VAS of 10 and generally present throughout the day but not influenced by time of day. Aggravating factors include sitting standing squatting and twisting and walking for prolonged periods of time. Also lifting bothers her back. Alleviating factors include rest warm showers and lying down. The pain disturbs her ability to concentrate and is associated with left lateral lower leg numbness and tingling. No weakness or bowel or bladder dysfunction is noted. It is also described as sharp shooting generally initiating in the low back with radiation into the buttocks and occasionally her hips worse on the right side presently. She has had previous epidurals without significant relief. Also she's had a recent MRI as noted below. It shows evidence of multilevel facet degeneration and a broad base disc bulge at L5-S1.  History Pankie has a past medical history of Allergic rhinitis; Cholelithiasis; Diabetes type 2, controlled ([place]); GERD (gastroesophageal reflux disease); Hypertension; Neuropathy ([place]); and Renal insufficiency (01/20/2016).   She has a past surgical history that includes Abdominal hysterectomy (1972); Cervical fusion; and Cholecystectomy (N/A, 12/28/2014).   Her family history includes Lung cancer in her father.She reports that she quit smoking about 17 years ago. She has quit using smokeless tobacco. She reports that she does not drink alcohol or use drugs.  No results  found for this or any previous visit.  No results found for: TOXASSSELUR  Outpatient Medications Prior to Visit  Medication Sig Dispense Refill  . albuterol (PROVENTIL HFA;VENTOLIN HFA) 108 (90 BASE) MCG/ACT inhaler Inhale 2 puffs into the lungs every 6 (six) hours as needed for wheezing or shortness of breath.     Marland Kitchen aspirin EC 81 MG tablet Take 81 mg by mouth daily.    Marland Kitchen atorvastatin (LIPITOR) 40 MG tablet Take 40 mg by mouth daily at 6 PM.     . ferrous sulfate 325 (65 FE) MG tablet Take 325 mg by mouth daily.    . fluticasone (FLONASE) 50 MCG/ACT nasal spray Place 2 sprays into both nostrils daily as needed for allergies or rhinitis.     Marland Kitchen gabapentin (NEURONTIN) 800 MG tablet Take 800 mg by mouth 3 (three) times daily.    . hydrochlorothiazide (HYDRODIURIL) 25 MG tablet Take 25 mg by mouth daily.    . insulin glulisine (APIDRA) 100 UNIT/ML injection Inject into the skin See admin instructions. Continuously via pump    . lisinopril (PRINIVIL,ZESTRIL) 10 MG tablet Take 10 mg by mouth daily.    . metFORMIN (GLUCOPHAGE-XR) 500 MG 24 hr tablet Take 500 mg by mouth 2 (two) times daily.    . Multiple Vitamin (MULTI-VITAMINS) TABS Take 1 tablet by mouth daily.    . pantoprazole (PROTONIX) 40 MG tablet Take 40 mg by mouth daily. Reported on 05/21/2015    . ranitidine (ZANTAC) 150 MG tablet Take 150 mg by mouth daily.     . VOLTAREN 1 % GEL Apply 2 g topically daily as  needed.      No facility-administered medications prior to visit.    Lab Results  Component Value Date   WBC 12.8 (H) 01/01/2015   HGB 11.7 (L) 01/01/2015   HCT 36.9 01/01/2015   PLT 400 01/01/2015   GLUCOSE 177 (H) 01/01/2015   ALT 62 (H) 01/01/2015   AST 72 (H) 01/01/2015   NA 136 01/01/2015   K 4.1 01/01/2015   CL 97 (L) 01/01/2015   CREATININE 0.95 01/01/2015   BUN 10 01/01/2015   CO2 31 01/01/2015     --------------------------------------------------------------------------------------------------------------------- Mr Lumbar Spine Wo Contrast  Result Date: 12/27/2015 CLINICAL DATA:  Low back pain extending at both hips and legs. Weakness in both legs. Numbness in both feet. EXAM: MRI LUMBAR SPINE WITHOUT CONTRAST TECHNIQUE: Multiplanar, multisequence MR imaging of the lumbar spine was performed. No intravenous contrast was administered. COMPARISON:  None. FINDINGS: Segmentation: 5 non rib-bearing lumbar type vertebral bodies are present. Alignment: Grade 1 anterolisthesis at L4-5 measures 5 mm. Mild leftward curvature is present. Vertebrae: Minimal endplate marrow changes present on the left at L5-S1. There are at least 4 discrete lesions within the L2 vertebral body. These all demonstrate T1 shortening consistent with a fat component. Other smaller hemangioma is are present throughout the lumbar spine otherwise. The lesions at L2 demonstrate some signal on the STIR sequence as well. This is not seen in the other lesions. Conus medullaris: Extends to the L1-2 level and appears normal. Paraspinal and other soft tissues: Limited imaging the abdomen is unremarkable. There is no significant adenopathy. Paraspinous musculature is within normal limits. Disc levels: L1-2: Mild facet hypertrophy is present bilaterally. There is a perineural root sleeve cyst on the left. No significant stenosis is present. L2-3: A mild broad-based disc protrusion is present. Mild facet hypertrophy is noted bilaterally. Perineural root sleeve cyst is present on the right. No significant stenosis is evident. L3-4: A broad-based disc protrusion is present. Mild facet hypertrophy is noted. Mild left subarticular and bilateral foraminal narrowing is present. L4-5: This is the most severe stenosis. A broad-based disc protrusion is present. Advanced facet hypertrophy is noted bilaterally. Moderate left and mild right subarticular and  foraminal narrowing is present. L5-S1: A broad-based disc protrusion is present. Moderate facet hypertrophy is noted. Mild subarticular narrowing is worse on the right. Moderate foraminal narrowing is present bilaterally. IMPRESSION: 1. 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 These likely represent hemangioma is. Recommend follow-up MRI of the lumbar spine without and with contrast in 3- 6 months to assure stability of these lesions. Neoplasm is considered on likely, but not excluded. 2. Multilevel spondylosis of the lumbar spine is most severe at L4-5. 3. Moderate left and mild right subarticular and foraminal stenosis at L4-5 secondary to a broad-based disc protrusion and advanced facet hypertrophy. 4. 5 mm anterolisthesis at L4-5. Given the degree of facet hypertrophy, this may be dynamic. 5. Mild subarticular and moderate foraminal narrowing bilaterally at L5-S1 is worse on the right. 6. Mild left and bilateral foraminal stenosis at L3-4. Electronically Signed   By: San Morelle M.D.   On: 12/27/2015 08:28       ---------------------------------------------------------------------------------------------------------------------- Past Medical History:  Diagnosis Date  . Allergic rhinitis   . Cholelithiasis   . Diabetes type 2, controlled ([place])   . GERD (gastroesophageal reflux disease)   . Hypertension   . Neuropathy ([place])   . Renal insufficiency 01/20/2016    Past Surgical  History:  Procedure Laterality Date  . ABDOMINAL HYSTERECTOMY  1972  . CERVICAL FUSION    . CHOLECYSTECTOMY N/A 12/28/2014   Procedure: LAPAROSCOPIC CHOLECYSTECTOMY;  Surgeon: Sherri Rad, MD;  Location: [facility] ORS;  Service: General;  Laterality: N/A;    Family History  Problem Relation Age of Onset  . Lung cancer Father     Social History  Substance Use Topics  . Smoking status: Former Smoker    Quit date: 02/21/1999  . Smokeless  tobacco: Former Systems developer  . Alcohol use No    ---------------------------------------------------------------------------------------------------------------------- Social History   Social History  . Marital status: Widowed    Spouse name: N/A  . Number of children: N/A  . Years of education: N/A   Social History Main Topics  . Smoking status: Former Smoker    Quit date: 02/21/1999  . Smokeless tobacco: Former Systems developer  . Alcohol use No  . Drug use: No  . Sexual activity: Not Asked   Other Topics Concern  . None   Social History Narrative  . None    Scheduled Meds: Continuous Infusions: PRN Meds:.   BP 138/77 (BP Location: Right Arm, Patient Position: Sitting, Cuff Size: Large)   Pulse 88   Temp 97.3 F (36.3 C) (Oral)   Resp 16   Ht 5\' 7"  (1.702 m)   Wt 153 lb (69.4 kg)   SpO2 100%   BMI 23.96 kg/m    BP Readings from Last 3 Encounters:  02/29/16 138/77  05/21/15 (!) 159/92  02/01/15 124/77     Wt Readings from Last 3 Encounters:  02/29/16 153 lb (69.4 kg)  05/21/15 153 lb 8.8 oz (69.7 kg)  02/01/15 154 lb 8.7 oz (70.1 kg)     ----------------------------------------------------------------------------------------------------------------------  ROS Review of Systems  Cardiac: No angina Pulmonary: No shortness of breath or wheezing GI: No complaints of constipation Psychologic: History of depression without drug abuse or suicidal ideation Endocrine: History of diabetes  Objective:  BP 138/77 (BP Location: Right Arm, Patient Position: Sitting, Cuff Size: Large)   Pulse 88   Temp 97.3 F (36.3 C) (Oral)   Resp 16   Ht 5\' 7"  (1.702 m)   Wt 153 lb (69.4 kg)   SpO2 100%   BMI 23.96 kg/m   Physical Exam Patient is a pleasant 66 year old black female alert oriented cooperative compliant Pupils equally round reactive to light extraocular muscles intact Heart regular rate and rhythm without murmur Lungs clear to auscultation Bilateral paraspinous  muscle tenderness is noted in the lumbar region with this patient standing. She has pain on extension with lateral rotation right side more prominent than left. With the patient supine she has a negative straight leg raise bilaterally. Her strength appears to be 5 over 5 both proximal and distal. She does have diminished sensation over the left lateral lower foot.     Assessment & Plan:   Regena was seen today for back pain, hip pain and leg pain.  Diagnoses and all orders for this visit:  DDD (degenerative disc disease), lumbar  Facet arthritis of lumbar region ([place]) -     [place]) MBNB; Future -     Ambulatory referral to Physical Therapy  Sciatica of left side associated with disorder of lumbosacral spine -     LUMBAR FACET(MEDIAL BRANCH NERVE BLOCK) MBNB; Future -     Ambulatory referral to Physical Therapy  Sciatica of right side associated with disorder of lumbar spine -  LUMBAR FACET(MEDIAL BRANCH NERVE BLOCK) MBNB; Future -     Ambulatory referral to Physical Therapy  Chronic bilateral low back pain with right-sided sciatica -     LUMBAR FACET(MEDIAL BRANCH NERVE BLOCK) MBNB; Future -     Ambulatory referral to Physical Therapy     ----------------------------------------------------------------------------------------------------------------------  Problem List Items Addressed This Visit    None    Visit Diagnoses    DDD (degenerative disc disease), lumbar    -  Primary   Facet arthritis of lumbar region [facility])       Relevant Orders   LUMBAR FACET(MEDIAL BRANCH NERVE BLOCK) MBNB   Ambulatory referral to Physical Therapy   Sciatica of left side associated with disorder of lumbosacral spine       Relevant Orders   LUMBAR FACET(MEDIAL BRANCH NERVE BLOCK) MBNB   Ambulatory referral to Physical Therapy   Sciatica of right side associated with disorder of lumbar spine       Relevant Orders   LUMBAR FACET(MEDIAL BRANCH NERVE  BLOCK) MBNB   Ambulatory referral to Physical Therapy   Chronic bilateral low back pain with right-sided sciatica       Relevant Orders   LUMBAR FACET(MEDIAL BRANCH NERVE BLOCK) MBNB   Ambulatory referral to Physical Therapy      ----------------------------------------------------------------------------------------------------------------------  1. DDD (degenerative disc disease), lumbar We have prescribed physical therapy evaluation and treatment for assistance with stretching strengthening exercises for her chronic low back pain.  2. Facet arthritis of lumbar region ([place]) In combination with above we plan on a diagnostic right side facet block at L2-3 L3-4 L4-5 L5-S1 on the right side at the next available date hopefully in one month. The risks and benefits of the procedure have been reviewed and full detail and all questions answered. - LUMBAR FACET(MEDIAL BRANCH NERVE BLOCK) MBNB; Future - Ambulatory referral to Physical Therapy  3. Sciatica of left side associated with disorder of lumbosacral spine As above - LUMBAR FACET(MEDIAL BRANCH NERVE BLOCK) MBNB; Future - Ambulatory referral to Physical Therapy  4. Sciatica of right side associated with disorder of lumbar spine  - LUMBAR FACET(MEDIAL BRANCH NERVE BLOCK) MBNB; Future - Ambulatory referral to Physical Therapy  5. Chronic bilateral low back pain with right-sided sciatica  - LUMBAR FACET(MEDIAL BRANCH NERVE BLOCK) MBNB; Future - Ambulatory referral to Physical Therapy    ----------------------------------------------------------------------------------------------------------------------  I am having Ms. Salina April maintain her albuterol, fluticasone, lisinopril, gabapentin, hydrochlorothiazide, insulin glulisine, MULTI-VITAMINS, ferrous sulfate, metFORMIN, aspirin EC, pantoprazole, VOLTAREN, atorvastatin, and ranitidine.   No orders of the defined types were placed in this encounter.      Follow-up: Return in  about 1 month (around 03/31/2016) for evaluation, procedure.    Molli Barrows, MD   The [place] practitioner database for opioid medications on this patient has been reviewed by me and my staff   Greater than 50% of the total encounter time was spent in counseling and / or coordination of care.     This dictation was performed utilizing Systems analyst.  Please excuse any unintentional or mistaken typographical errors as a result.

## 2016-02-29 NOTE — Progress Notes (Signed)
Safety precautions to be maintained throughout the outpatient stay will include: orient to surroundings, keep bed in low position, maintain call bell within reach at all times, provide assistance with transfer out of bed and ambulation.  

## 2016-02-29 NOTE — Patient Instructions (Signed)
Pain Management Discharge Instructions  General Discharge Instructions :  If you need to reach your doctor call: Monday-Friday 8:00 am - 4:00 pm at [phone] or toll free [phone].  After clinic hours [phone] to have operator reach doctor.  Bring all of your medication bottles to all your appointments in the pain clinic.  To cancel or reschedule your appointment with Pain Management please remember to call 24 hours in advance to avoid a fee.  Refer to the educational materials which you have been given on: General Risks, I had my Procedure. Discharge Instructions, Post Sedation.  Post Procedure Instructions:  The drugs you were given will stay in your system until tomorrow, so for the next 24 hours you should not drive, make any legal decisions or drink any alcoholic beverages.  You may eat anything you prefer, but it is better to start with liquids then soups and crackers, and gradually work up to solid foods.  Please notify your doctor immediately if you have any unusual bleeding, trouble breathing or pain that is not related to your normal pain.  Depending on the type of procedure that was done, some parts of your body may feel week and/or numb.  This usually clears up by tonight or the next day.  Walk with the use of an assistive device or accompanied by an adult for the 24 hours.  You may use ice on the affected area for the first 24 hours.  Put ice in a Ziploc bag and cover with a towel and place against area 15 minutes on 15 minutes off.  You may switch to heat after 24 hours.GENERAL RISKS AND COMPLICATIONS  What are the risk, side effects and possible complications? Generally speaking, most procedures are safe.  However, with any procedure there are risks, side effects, and the possibility of complications.  The risks and complications are dependent upon the sites that are lesioned, or the type of nerve block to be performed.  The closer the procedure is to the spine,  the more serious the risks are.  Great care is taken when placing the radio frequency needles, block needles or lesioning probes, but sometimes complications can occur. 1. Infection: Any time there is an injection through the skin, there is a risk of infection.  This is why sterile conditions are used for these blocks.  There are four possible types of infection. 1. Localized skin infection. 2. Central Nervous System Infection-This can be in the form of Meningitis, which can be deadly. 3. Epidural Infections-This can be in the form of an epidural abscess, which can cause pressure inside of the spine, causing compression of the spinal cord with subsequent paralysis. This would require an emergency surgery to decompress, and there are no guarantees that the patient would recover from the paralysis. 4. Discitis-This is an infection of the intervertebral discs.  It occurs in about 1% of discography procedures.  It is difficult to treat and it may lead to surgery.        2. Pain: the needles have to go through skin and soft tissues, will cause soreness.       3. Damage to internal structures:  The nerves to be lesioned may be near blood vessels or    other nerves which can be potentially damaged.       4. Bleeding: Bleeding is more common if the patient is taking blood thinners such as  aspirin, Coumadin, Ticiid, Plavix, etc., or if he/she have some genetic predisposition  such as   hemophilia. Bleeding into the spinal canal can cause compression of the spinal  cord with subsequent paralysis.  This would require an emergency surgery to  decompress and there are no guarantees that the patient would recover from the  paralysis.       5. Pneumothorax:  Puncturing of a lung is a possibility, every time a needle is introduced in  the area of the chest or upper back.  Pneumothorax refers to free air around the  collapsed lung(s), inside of the thoracic cavity (chest cavity).  Another two possible  complications  related to a similar event would include: Hemothorax and Chylothorax.   These are variations of the Pneumothorax, where instead of air around the collapsed  lung(s), you may have blood or chyle, respectively.       6. Spinal headaches: They may occur with any procedures in the area of the spine.       7. Persistent CSF (Cerebro-Spinal Fluid) leakage: This is a rare problem, but may occur  with prolonged intrathecal or epidural catheters either due to the formation of a fistulous  track or a dural tear.       8. Nerve damage: By working so close to the spinal cord, there is always a possibility of  nerve damage, which could be as serious as a permanent spinal cord injury with  paralysis.       9. Death:  Although rare, severe deadly allergic reactions known as "Anaphylactic  reaction" can occur to any of the medications used.      10. Worsening of the symptoms:  We can always make thing worse.  What are the chances of something like this happening? Chances of any of this occuring are extremely low.  By statistics, you have more of a chance of getting killed in a motor vehicle accident: while driving to the hospital than any of the above occurring .  Nevertheless, you should be aware that they are possibilities.  In general, it is similar to taking a shower.  Everybody knows that you can slip, hit your head and get killed.  Does that mean that you should not shower again?  Nevertheless always keep in mind that statistics do not mean anything if you happen to be on the wrong side of them.  Even if a procedure has a 1 (one) in a 1,000,000 (million) chance of going wrong, it you happen to be that one..Also, keep in mind that by statistics, you have more of a chance of having something go wrong when taking medications.  Who should not have this procedure? If you are on a blood thinning medication (e.g. Coumadin, Plavix, see list of "Blood Thinners"), or if you have an active infection going on, you should not  have the procedure.  If you are taking any blood thinners, please inform your physician.  How should I prepare for this procedure?  Do not eat or drink anything at least six hours prior to the procedure.  Bring a driver with you .  It cannot be a taxi.  Come accompanied by an adult that can drive you back, and that is strong enough to help you if your legs get weak or numb from the local anesthetic.  Take all of your medicines the morning of the procedure with just enough water to swallow them.  If you have diabetes, make sure that you are scheduled to have your procedure done first thing in the morning, whenever possible.  If you have diabetes,   take only half of your insulin dose and notify our nurse that you have done so as soon as you arrive at the clinic.  If you are diabetic, but only take blood sugar pills (oral hypoglycemic), then do not take them on the morning of your procedure.  You may take them after you have had the procedure.  Do not take aspirin or any aspirin-containing medications, at least eleven (11) days prior to the procedure.  They may prolong bleeding.  Wear loose fitting clothing that may be easy to take off and that you would not mind if it got stained with Betadine or blood.  Do not wear any jewelry or perfume  Remove any nail coloring.  It will interfere with some of our monitoring equipment.  NOTE: Remember that this is not meant to be interpreted as a complete list of all possible complications.  Unforeseen problems may occur.  BLOOD THINNERS The following drugs contain aspirin or other products, which can cause increased bleeding during surgery and should not be taken for 2 weeks prior to and 1 week after surgery.  If you should need take something for relief of minor pain, you may take acetaminophen which is found in Tylenol,m Datril, Anacin-3 and Panadol. It is not blood thinner. The products listed below are.  Do not take any of the products listed below  in addition to any listed on your instruction sheet.  A.P.C or A.P.C with Codeine Codeine Phosphate Capsules #3 Ibuprofen Ridaura  ABC compound Congesprin Imuran rimadil  Advil Cope Indocin Robaxisal  Alka-Seltzer Effervescent Pain Reliever and Antacid Coricidin or Coricidin-D  Indomethacin Rufen  Alka-Seltzer plus Cold Medicine Cosprin Ketoprofen S-A-C Tablets  Anacin Analgesic Tablets or Capsules Coumadin Korlgesic Salflex  Anacin Extra Strength Analgesic tablets or capsules CP-2 Tablets Lanoril Salicylate  Anaprox Cuprimine Capsules Levenox Salocol  Anexsia-D Dalteparin Magan Salsalate  Anodynos Darvon compound Magnesium Salicylate Sine-off  Ansaid Dasin Capsules Magsal Sodium Salicylate  Anturane Depen Capsules Marnal Soma  APF Arthritis pain formula Dewitt's Pills Measurin Stanback  Argesic Dia-Gesic Meclofenamic Sulfinpyrazone  Arthritis Bayer Timed Release Aspirin Diclofenac Meclomen Sulindac  Arthritis pain formula Anacin Dicumarol Medipren Supac  Analgesic (Safety coated) Arthralgen Diffunasal Mefanamic Suprofen  Arthritis Strength Bufferin Dihydrocodeine Mepro Compound Suprol  Arthropan liquid Dopirydamole Methcarbomol with Aspirin Synalgos  ASA tablets/Enseals Disalcid Micrainin Tagament  Ascriptin Doan's Midol Talwin  Ascriptin A/D Dolene Mobidin Tanderil  Ascriptin Extra Strength Dolobid Moblgesic Ticlid  Ascriptin with Codeine Doloprin or Doloprin with Codeine Momentum Tolectin  Asperbuf Duoprin Mono-gesic Trendar  Aspergum Duradyne Motrin or Motrin IB Triminicin  Aspirin plain, buffered or enteric coated Durasal Myochrisine Trigesic  Aspirin Suppositories Easprin Nalfon Trillsate  Aspirin with Codeine Ecotrin Regular or Extra Strength Naprosyn Uracel  Atromid-S Efficin Naproxen Ursinus  Auranofin Capsules Elmiron Neocylate Vanquish  Axotal Emagrin Norgesic Verin  Azathioprine Empirin or Empirin with Codeine Normiflo Vitamin E  Azolid Emprazil Nuprin Voltaren  Bayer  Aspirin plain, buffered or children's or timed BC Tablets or powders Encaprin Orgaran Warfarin Sodium  Buff-a-Comp Enoxaparin Orudis Zorpin  Buff-a-Comp with Codeine Equegesic Os-Cal-Gesic   Buffaprin Excedrin plain, buffered or Extra Strength Oxalid   Bufferin Arthritis Strength Feldene Oxphenbutazone   Bufferin plain or Extra Strength Feldene Capsules Oxycodone with Aspirin   Bufferin with Codeine Fenoprofen Fenoprofen Pabalate or Pabalate-SF   Buffets II Flogesic Panagesic   Buffinol plain or Extra Strength Florinal or Florinal with Codeine Panwarfarin   Buf-Tabs Flurbiprofen Penicillamine   Butalbital Compound Four-way cold tablets   Penicillin   Butazolidin Fragmin Pepto-Bismol   Carbenicillin Geminisyn Percodan   Carna Arthritis Reliever Geopen Persantine   Carprofen Gold's salt Persistin   Chloramphenicol Goody's Phenylbutazone   Chloromycetin Haltrain Piroxlcam   Clmetidine heparin Plaquenil   Cllnoril Hyco-pap Ponstel   Clofibrate Hydroxy chloroquine Propoxyphen         Before stopping any of these medications, be sure to consult the physician who ordered them.  Some, such as Coumadin (Warfarin) are ordered to prevent or treat serious conditions such as "deep thrombosis", "pumonary embolisms", and other heart problems.  The amount of time that you may need off of the medication may also vary with the medication and the reason for which you were taking it.  If you are taking any of these medications, please make sure you notify your pain physician before you undergo any procedures.         Facet Joint Block The facet joints connect the bones of the spine (vertebrae). They make it possible for you to bend, twist, and make other movements with your spine. They also prevent you from overbending, overtwisting, and making other excessive movements.  A facet joint block is a procedure where a numbing medicine (anesthetic) is injected into a facet joint. Often, a type of  anti-inflammatory medicine called a steroid is also injected. A facet joint block may be done for two reasons:   Diagnosis. A facet joint block may be done as a test to see whether neck or back pain is caused by a worn-down or infected facet joint. If the pain gets better after a facet joint block, it means the pain is probably coming from the facet joint. If the pain does not get better, it means the pain is probably not coming from the facet joint.   Therapy. A facet joint block may be done to relieve neck or back pain caused by a facet joint. A facet joint block is only done as a therapy if the pain does not improve with medicine, exercise programs, physical therapy, and other forms of pain management. LET [facility] CARE PROVIDER KNOW ABOUT:   Any allergies you have.   All medicines you are taking, including vitamins, herbs, eyedrops, and over-the-counter medicines and creams.   Previous problems you or members of your family have had with the use of anesthetics.   Any blood disorders you have had.   Other health problems you have. RISKS AND COMPLICATIONS Generally, having a facet joint block is safe. However, as with any procedure, complications can occur. Possible complications associated with having a facet joint block include:   Bleeding.   Injury to a nerve near the injection site.   Pain at the injection site.   Weakness or numbness in areas controlled by nerves near the injection site.   Infection.   Temporary fluid retention.   Allergic reaction to anesthetics or medicines used during the procedure. BEFORE THE PROCEDURE   Follow your health care provider's instructions if you are taking dietary supplements or medicines. You may need to stop taking them or reduce your dosage.   Do not take any new dietary supplements or medicines without asking your health care provider first.   Follow your health care provider's instructions about eating and drinking  before the procedure. You may need to stop eating and drinking several hours before the procedure.   Arrange to have an adult drive you home after the procedure. PROCEDURE  You may need to remove your clothing and  dress in an open-back gown so that your health care provider can access your spine.   The procedure will be done while you are lying on an X-ray table. Most of the time you will be asked to lie on your stomach, but you may be asked to lie in a different position if an injection will be made in your neck.   Special machines will be used to monitor your oxygen levels, heart rate, and blood pressure.   If an injection will be made in your neck, an intravenous (IV) tube will be inserted into one of your veins. Fluids and medicine will flow directly into your body through the IV tube.   The area over the facet joint where the injection will be made will be cleaned with an antiseptic soap. The surrounding skin will be covered with sterile drapes.   An anesthetic will be applied to your skin to make the injection area numb. You may feel a temporary stinging or burning sensation.   A video X-ray machine will be used to locate the joint. A contrast dye may be injected into the facet joint area to help with locating the joint.   When the joint is located, an anesthetic medicine will be injected into the joint through the needle.   Your health care provider will ask you whether you feel pain relief. If you do feel relief, a steroid may be injected to provide pain relief for a longer period of time. If you do not feel relief or feel only partial relief, additional injections of an anesthetic may be made in other facet joints.   The needle will be removed, the skin will be cleansed, and bandages will be applied.  AFTER THE PROCEDURE   You will be observed for 15-30 minutes before being allowed to go home. Do not drive. Have an adult drive you or take a taxi or public transportation  instead.   If you feel pain relief, the pain will return in several hours or days when the anesthetic wears off.   You may feel pain relief 2-14 days after the procedure. The amount of time this relief lasts varies from person to person.   It is normal to feel some tenderness over the injected area(s) for 2 days following the procedure.   If you have diabetes, you may have a temporary increase in blood sugar. This information is not intended to replace advice given to you by your health care provider. Make sure you discuss any questions you have with your health care provider. Document Released: 06/28/2006 Document Revised: 02/27/2014 Document Reviewed: 11/02/2014 Elsevier Interactive Patient Education  2017 [employer].

## 2016-03-06 LAB — TOXASSURE SELECT 13 (MW), URINE

## 2016-04-06 ENCOUNTER — Telehealth: Payer: Self-pay | Admitting: *Deleted

## 2016-04-13 ENCOUNTER — Telehealth: Payer: Self-pay

## 2016-04-13 NOTE — Telephone Encounter (Signed)
Spoke with patient re; needing to come in earlier for procedure.  Explained to patient that at present we do not have an earlier openings. She does have a PA for a lumbar facet right side.  I explained that we will try and call her if we have any cancellations and that she can call back to check as well. Patient verbalizes u/o information.

## 2016-04-13 NOTE — Telephone Encounter (Signed)
Pt says she is in a lot of pain and can't walk pt would like to come on an early day for her  Procedure. She is scheduled for a procedure on 3/22. She wants to know will Dr. Andree Elk allow her to come in earlier. I did let her know the schedule was full.

## 2016-04-15 ENCOUNTER — Emergency Department: Payer: Medicare PPO

## 2016-04-15 ENCOUNTER — Encounter: Payer: Self-pay | Admitting: Emergency Medicine

## 2016-04-15 ENCOUNTER — Emergency Department
Admission: EM | Admit: 2016-04-15 | Discharge: 2016-04-15 | Disposition: A | Discharge status: Home / Self Care | Payer: Medicare PPO | Attending: Emergency Medicine | Admitting: Emergency Medicine

## 2016-04-15 DIAGNOSIS — I10 Essential (primary) hypertension: Secondary | ICD-10-CM | POA: Diagnosis not present

## 2016-04-15 DIAGNOSIS — M791 Myalgia: Secondary | ICD-10-CM | POA: Insufficient documentation

## 2016-04-15 DIAGNOSIS — Z7982 Long term (current) use of aspirin: Secondary | ICD-10-CM | POA: Diagnosis not present

## 2016-04-15 DIAGNOSIS — E119 Type 2 diabetes mellitus without complications: Secondary | ICD-10-CM | POA: Diagnosis not present

## 2016-04-15 DIAGNOSIS — Z794 Long term (current) use of insulin: Secondary | ICD-10-CM | POA: Diagnosis not present

## 2016-04-15 DIAGNOSIS — Z79899 Other long term (current) drug therapy: Secondary | ICD-10-CM | POA: Diagnosis not present

## 2016-04-15 DIAGNOSIS — M25571 Pain in right ankle and joints of right foot: Secondary | ICD-10-CM | POA: Diagnosis present

## 2016-04-15 DIAGNOSIS — Z87891 Personal history of nicotine dependence: Secondary | ICD-10-CM | POA: Diagnosis not present

## 2016-04-15 DIAGNOSIS — M7918 Myalgia, other site: Secondary | ICD-10-CM

## 2016-04-15 MED ORDER — IBUPROFEN 800 MG PO TABS
800.0000 mg | ORAL_TABLET | Freq: Once | ORAL | Status: AC
  Administered 2016-04-15: 800 mg via ORAL
  Filled 2016-04-15: qty 1

## 2016-04-15 MED ORDER — DICLOFENAC SODIUM 1 % TD GEL: 2.0000 g | Freq: Four times a day (QID) | TRANSDERMAL | 0 refills | Status: DC

## 2016-04-15 NOTE — ED Notes (Signed)
NAD noted at time of D/C. Pt denies questions or concerns. Pt ambulatory to the lobby at this time.  

## 2016-04-15 NOTE — ED Triage Notes (Signed)
Pt reports right ankle pain that started yesterday. Denies injury, painful to bear weight, has been limping. Pt states pain 8/10 sharp, throbbing. Minimal amount of swelling noted.

## 2016-04-15 NOTE — ED Provider Notes (Signed)
[facility] Emergency Department Provider Note  ____________________________________________  Time seen: Approximately 6:39 PM  I have reviewed the triage vital signs and the nursing notes.   HISTORY  Chief Complaint Ankle Pain    HPI Audrey Chan is a 66 y.o. female that presents to the emergency department with 2 days of right ankle pain. Patient states that ankle began hurting at the front yesterday morning. Patient continued to walk on it all day at work yesterday and today and pain has continued to get worse. While patient was sitting, she has no difficulty moving ankle. Patient has never had problems with this ankle before. Patient denies any trauma. Patient has appointment with pain clinic to receive injections on March 22 for hip and back pain. Patient took Tylenol yesterday for pain but has not taken anything today. Patient denies fever, headache, shortness of breath, chest pain, nausea, vomiting, abdominal pain, weakness, numbness, tingling.  Past Medical History:  Diagnosis Date  . Allergic rhinitis   . Cholelithiasis   . Diabetes type 2, controlled ([place])   . GERD (gastroesophageal reflux disease)   . Hypertension   . Neuropathy ([place])   . Renal insufficiency 01/20/2016    Patient Active Problem List   Diagnosis Date Noted  . Pulmonary nodules 01/28/2015  . Supraclavicular adenopathy 01/21/2015  . Abnormal MRI, lumbar spine 01/01/2015  . Weight loss, unintentional 01/01/2015  . Lymphocytosis 01/01/2015  . Microcytic red blood cells 01/01/2015  . Recurrent biliary colic [id number]  . Calculus of gallbladder with acute on chronic cholecystitis without obstruction 12/22/2014  . Encounter for fitting or adjustment of insulin pump 08/09/2014  . Diabetic peripheral neuropathy associated with type 2 diabetes mellitus ([place]) 08/09/2014  . BP (high blood pressure) 05/24/2012  . Diabetes mellitus ([place]) 05/24/2012  . Idiopathic localized  osteoarthropathy 04/09/2012  . Dermatitis, eczematoid 02/27/2012  . Can't get food down 09/26/2011    Past Surgical History:  Procedure Laterality Date  . ABDOMINAL HYSTERECTOMY  1972  . CERVICAL FUSION    . CHOLECYSTECTOMY N/A 12/28/2014   Procedure: LAPAROSCOPIC CHOLECYSTECTOMY;  Surgeon: Sherri Rad, MD;  Location: [facility] ORS;  Service: General;  Laterality: N/A;    Prior to Admission medications   Medication Sig Start Date End Date Taking? Authorizing Provider  albuterol (PROVENTIL HFA;VENTOLIN HFA) 108 (90 BASE) MCG/ACT inhaler Inhale 2 puffs into the lungs every 6 (six) hours as needed for wheezing or shortness of breath.     Historical Provider, MD  aspirin EC 81 MG tablet Take 81 mg by mouth daily.    Historical Provider, MD  atorvastatin (LIPITOR) 40 MG tablet Take 40 mg by mouth daily at 6 PM.  11/16/14   Historical Provider, MD  diclofenac sodium (VOLTAREN) 1 % GEL Apply 2 g topically 4 (four) times daily. 04/15/16   Laban Emperor, PA-C  ferrous sulfate 325 (65 FE) MG tablet Take 325 mg by mouth daily.    Historical Provider, MD  fluticasone (FLONASE) 50 MCG/ACT nasal spray Place 2 sprays into both nostrils daily as needed for allergies or rhinitis.     Historical Provider, MD  gabapentin (NEURONTIN) 800 MG tablet Take 800 mg by mouth 3 (three) times daily.    Historical Provider, MD  hydrochlorothiazide (HYDRODIURIL) 25 MG tablet Take 25 mg by mouth daily.    Historical Provider, MD  insulin glulisine (APIDRA) 100 UNIT/ML injection Inject into the skin See admin instructions. Continuously via pump    Historical Provider, MD  lisinopril (PRINIVIL,ZESTRIL) 10  MG tablet Take 10 mg by mouth daily.    Historical Provider, MD  metFORMIN (GLUCOPHAGE-XR) 500 MG 24 hr tablet Take 500 mg by mouth 2 (two) times daily.    Historical Provider, MD  Multiple Vitamin (MULTI-VITAMINS) TABS Take 1 tablet by mouth daily.    Historical Provider, MD  pantoprazole (PROTONIX) 40 MG tablet Take 40 mg by  mouth daily. Reported on 05/21/2015 11/06/14   Historical Provider, MD  ranitidine (ZANTAC) 150 MG tablet Take 150 mg by mouth daily.  05/03/15   Historical Provider, MD    Allergies Patient has no known allergies.  Family History  Problem Relation Age of Onset  . Lung cancer Father     Social History Social History  Substance Use Topics  . Smoking status: Former Smoker    Quit date: 02/21/1999  . Smokeless tobacco: Former Systems developer  . Alcohol use No     Review of Systems  Constitutional: No fever/chills ENT: No upper respiratory complaints. Cardiovascular: No chest pain. Respiratory: No cough. No SOB. Gastrointestinal: No abdominal pain.  No nausea, no vomiting.  Skin: Negative for rash, abrasions, lacerations, ecchymosis. Neurological: Negative for headaches, numbness or tingling   ____________________________________________   PHYSICAL EXAM:  VITAL SIGNS: ED Triage Vitals [04/15/16 1758]  Enc Vitals Group     BP (!) 144/78     Pulse Rate (!) 101     Resp 18     Temp 98.8 F (37.1 C)     Temp Source Oral     SpO2 95 %     Weight 154 lb (69.9 kg)     Height 5\' 7"  (1.702 m)     Head Circumference      Peak Flow      Pain Score 8     Pain Loc      Pain Edu?      Excl. in [place]?      Constitutional: Alert and oriented. Well appearing and in no acute distress. Eyes: Conjunctivae are normal. PERRL. EOMI. Head: Atraumatic. ENT:      Ears:      Nose: No congestion/rhinnorhea.      Mouth/Throat: Mucous membranes are moist.  Neck: No stridor.   Cardiovascular: Normal rate, regular rhythm.  Good peripheral circulation. Respiratory: Normal respiratory effort without tachypnea or retractions. Lungs CTAB. Good air entry to the bases with no decreased or absent breath sounds. Musculoskeletal: Full range of motion to all extremities. No gross deformities appreciated. Mild tenderness to palpation over front of ankle. No tenderness to palpation over foot. No tenderness over the  plantar fascia. No swelling. No erythema. Neurologic:  Normal speech and language. No gross focal neurologic deficits are appreciated.  Skin:  Skin is warm, dry and intact. No rash noted. Psychiatric: Mood and affect are normal. Speech and behavior are normal. Patient exhibits appropriate insight and judgement.   ____________________________________________   LABS (all labs ordered are listed, but only abnormal results are displayed)  Labs Reviewed - No data to display ____________________________________________  EKG   ____________________________________________  RADIOLOGY Robinette Haines, personally viewed and evaluated these images (plain radiographs) as part of my medical decision making, as well as reviewing the written report by the radiologist.  Dg Ankle Complete Right  Result Date: 04/15/2016 CLINICAL DATA:  Lateral right ankle pain.  No known injury. EXAM: RIGHT ANKLE - COMPLETE 3+ VIEW COMPARISON:  None. FINDINGS: There is no evidence of fracture, dislocation, or joint effusion. There is no evidence of arthropathy or  other focal bone abnormality. Soft tissues are unremarkable. IMPRESSION: Negative. Electronically Signed   By: Lajean Manes M.D.   On: 04/15/2016 18:58    ____________________________________________    PROCEDURES  Procedure(s) performed:    Procedures    Medications  ibuprofen (ADVIL,MOTRIN) tablet 800 mg (800 mg Oral Given 04/15/16 1836)     ____________________________________________   INITIAL IMPRESSION / ASSESSMENT AND PLAN / ED COURSE  Pertinent labs & imaging results that were available during my care of the patient were reviewed by me and considered in my medical decision making (see chart for details).  Review of the [place] CSRS was performed in accordance of the [place] prior to dispensing any controlled drugs.     Patient's diagnosis is consistent with musculoskeletal pain. Vital signs and exam are reassuring. X-ray negative for  any acute bony abnormalities. Patient felt better after ibuprofen. Ankle was wrapped and patient will use crutches that she already has at home. Patient will be discharged home with prescriptions for diclofenac gel. Patient is to follow up with PCP as directed. Patient is given ED precautions to return to the ED for any worsening or new symptoms.     ____________________________________________  FINAL CLINICAL IMPRESSION(S) / ED DIAGNOSES  Final diagnoses:  Musculoskeletal pain      NEW MEDICATIONS STARTED DURING THIS VISIT:  New Prescriptions   DICLOFENAC SODIUM (VOLTAREN) 1 % GEL    Apply 2 g topically 4 (four) times daily.        This chart was dictated using voice recognition software/Dragon. Despite best efforts to proofread, errors can occur which can change the meaning. Any change was purely unintentional.    Laban Emperor, PA-C 04/15/16 2005    Lavonia Drafts, MD 04/15/16 2125

## 2016-05-11 ENCOUNTER — Ambulatory Visit (HOSPITAL_BASED_OUTPATIENT_CLINIC_OR_DEPARTMENT_OTHER): Payer: Medicare PPO | Admitting: Anesthesiology

## 2016-05-11 ENCOUNTER — Ambulatory Visit
Admission: RE | Admit: 2016-05-11 | Discharge: 2016-05-11 | Disposition: A | Discharge status: Home / Self Care | Payer: Medicare PPO | Source: Ambulatory Visit | Attending: Anesthesiology | Admitting: Anesthesiology

## 2016-05-11 ENCOUNTER — Other Ambulatory Visit: Payer: Self-pay | Admitting: Anesthesiology

## 2016-05-11 ENCOUNTER — Encounter: Payer: Self-pay | Admitting: Anesthesiology

## 2016-05-11 VITALS — BP 169/72 | HR 91 | Temp 97.6°F | Resp 14 | Ht 67.0 in | Wt 155.0 lb

## 2016-05-11 DIAGNOSIS — G8929 Other chronic pain: Secondary | ICD-10-CM

## 2016-05-11 DIAGNOSIS — M5386 Other specified dorsopathies, lumbar region: Secondary | ICD-10-CM

## 2016-05-11 DIAGNOSIS — M47816 Spondylosis without myelopathy or radiculopathy, lumbar region: Secondary | ICD-10-CM

## 2016-05-11 DIAGNOSIS — M5387 Other specified dorsopathies, lumbosacral region: Secondary | ICD-10-CM

## 2016-05-11 DIAGNOSIS — R52 Pain, unspecified: Secondary | ICD-10-CM

## 2016-05-11 DIAGNOSIS — M5441 Lumbago with sciatica, right side: Secondary | ICD-10-CM | POA: Insufficient documentation

## 2016-05-11 DIAGNOSIS — M4686 Other specified inflammatory spondylopathies, lumbar region: Secondary | ICD-10-CM | POA: Insufficient documentation

## 2016-05-11 DIAGNOSIS — M545 Low back pain: Secondary | ICD-10-CM | POA: Diagnosis present

## 2016-05-11 DIAGNOSIS — M5442 Lumbago with sciatica, left side: Secondary | ICD-10-CM

## 2016-05-11 DIAGNOSIS — M5136 Other intervertebral disc degeneration, lumbar region: Secondary | ICD-10-CM | POA: Insufficient documentation

## 2016-05-11 DIAGNOSIS — M4696 Unspecified inflammatory spondylopathy, lumbar region: Secondary | ICD-10-CM

## 2016-05-11 DIAGNOSIS — M539 Dorsopathy, unspecified: Secondary | ICD-10-CM

## 2016-05-11 MED ORDER — ROPIVACAINE HCL 5 MG/ML IJ SOLN
INTRAMUSCULAR | Status: AC
  Filled 2016-05-11: qty 20

## 2016-05-11 MED ORDER — ROPIVACAINE HCL 5 MG/ML IJ SOLN
10.0000 mL | Freq: Once | INTRAMUSCULAR | Status: AC
  Administered 2016-05-11: 4 mL via EPIDURAL

## 2016-05-11 MED ORDER — MIDAZOLAM HCL 5 MG/5ML IJ SOLN
INTRAMUSCULAR | Status: AC
  Filled 2016-05-11: qty 5

## 2016-05-11 MED ORDER — TRIAMCINOLONE ACETONIDE 40 MG/ML IJ SUSP
INTRAMUSCULAR | Status: AC
  Filled 2016-05-11: qty 1

## 2016-05-11 MED ORDER — SODIUM CHLORIDE 0.9% FLUSH: 10.0000 mL | Freq: Once | INTRAVENOUS | Status: DC

## 2016-05-11 MED ORDER — TRIAMCINOLONE ACETONIDE 40 MG/ML IJ SUSP
40.0000 mg | Freq: Once | INTRAMUSCULAR | Status: AC
  Administered 2016-05-11: 40 mg

## 2016-05-11 MED ORDER — MIDAZOLAM HCL 5 MG/5ML IJ SOLN
5.0000 mg | Freq: Once | INTRAMUSCULAR | Status: AC
  Administered 2016-05-11: 1.5 mg via INTRAVENOUS

## 2016-05-11 MED ORDER — LIDOCAINE HCL (PF) 1 % IJ SOLN: 5.0000 mL | Freq: Once | INTRAMUSCULAR | Status: DC

## 2016-05-11 MED ORDER — LACTATED RINGERS IV SOLN
1000.0000 mL | INTRAVENOUS | Status: DC
  Administered 2016-05-11: 1000 mL via INTRAVENOUS

## 2016-05-11 NOTE — Patient Instructions (Signed)
Pain Management Discharge Instructions  General Discharge Instructions :  If you need to reach your doctor call: Monday-Friday 8:00 am - 4:00 pm at [phone] or toll free [phone].  After clinic hours [phone] to have operator reach doctor.  Bring all of your medication bottles to all your appointments in the pain clinic.  To cancel or reschedule your appointment with Pain Management please remember to call 24 hours in advance to avoid a fee.  Refer to the educational materials which you have been given on: General Risks, I had my Procedure. Discharge Instructions, Post Sedation.  Post Procedure Instructions:  The drugs you were given will stay in your system until tomorrow, so for the next 24 hours you should not drive, make any legal decisions or drink any alcoholic beverages.  You may eat anything you prefer, but it is better to start with liquids then soups and crackers, and gradually work up to solid foods.  Please notify your doctor immediately if you have any unusual bleeding, trouble breathing or pain that is not related to your normal pain.  Depending on the type of procedure that was done, some parts of your body may feel week and/or numb.  This usually clears up by tonight or the next day.  Walk with the use of an assistive device or accompanied by an adult for the 24 hours.  You may use ice on the affected area for the first 24 hours.  Put ice in a Ziploc bag and cover with a towel and place against area 15 minutes on 15 minutes off.  You may switch to heat after 24 hours.Facet Blocks Patient Information  Description: The facets are joints in the spine between the vertebrae.  Like any joints in the body, facets can become irritated and painful.  Arthritis can also effect the facets.  By injecting steroids and local anesthetic in and around these joints, we can temporarily block the nerve supply to them.  Steroids act directly on irritated nerves and tissues to  reduce selling and inflammation which often leads to decreased pain.  Facet blocks may be done anywhere along the spine from the neck to the low back depending upon the location of your pain.   After numbing the skin with local anesthetic (like Novocaine), a small needle is passed onto the facet joints under x-ray guidance.  You may experience a sensation of pressure while this is being done.  The entire block usually lasts about 15-25 minutes.   Conditions which may be treated by facet blocks:   Low back/buttock pain  Neck/shoulder pain  Certain types of headaches  Preparation for the injection:  1. Do not eat any solid food or dairy products within 8 hours of your appointment. 2. You may drink clear liquid up to 3 hours before appointment.  Clear liquids include water, black coffee, juice or soda.  No milk or cream please. 3. You may take your regular medication, including pain medications, with a sip of water before your appointment.  Diabetics should hold regular insulin (if taken separately) and take 1/2 normal NPH dose the morning of the procedure.  Carry some sugar containing items with you to your appointment. 4. A driver must accompany you and be prepared to drive you home after your procedure. 5. Bring all your current medications with you. 6. An IV may be inserted and sedation may be given at the discretion of the physician. 7. A blood pressure cuff, EKG and other monitors will often be   applied during the procedure.  Some patients may need to have extra oxygen administered for a short period. 8. You will be asked to provide medical information, including your allergies and medications, prior to the procedure.  We must know immediately if you are taking blood thinners (like Coumadin/Warfarin) or if you are allergic to IV iodine contrast (dye).  We must know if you could possible be pregnant.  Possible side-effects:   Bleeding from needle site  Infection (rare, may require  surgery)  Nerve injury (rare)  Numbness & tingling (temporary)  Difficulty urinating (rare, temporary)  Spinal headache (a headache worse with upright posture)  Light-headedness (temporary)  Pain at injection site (serveral days)  Decreased blood pressure (rare, temporary)  Weakness in arm/leg (temporary)  Pressure sensation in back/neck (temporary)   Call if you experience:   Fever/chills associated with headache or increased back/neck pain  Headache worsened by an upright position  New onset, weakness or numbness of an extremity below the injection site  Hives or difficulty breathing (go to the emergency room)  Inflammation or drainage at the injection site(s)  Severe back/neck pain greater than usual  New symptoms which are concerning to you  Please note:  Although the local anesthetic injected can often make your back or neck feel good for several hours after the injection, the pain will likely return. It takes 3-7 days for steroids to work.  You may not notice any pain relief for at least one week.  If effective, we will often do a series of 2-3 injections spaced 3-6 weeks apart to maximally decrease your pain.  After the initial series, you may be a candidate for a more permanent nerve block of the facets.  If you have any questions, please call [phone] [facility] 

## 2016-05-11 NOTE — Progress Notes (Signed)
Safety precautions to be maintained throughout the outpatient stay will include: orient to surroundings, keep bed in low position, maintain call bell within reach at all times, provide assistance with transfer out of bed and ambulation.  

## 2016-05-12 ENCOUNTER — Telehealth: Payer: Self-pay

## 2016-05-12 NOTE — Telephone Encounter (Signed)
Post procedure phone call.  States she is doing ok.   

## 2016-05-12 NOTE — Progress Notes (Signed)
Subjective:  Patient ID: Audrey Chan, female    DOB: [birth year]  Age: 66 y.o. MRN: [id number]  CC: Back Pain (low)   Service Provided on Last Visit: Evaluation (new patient)  PROCEDURE:Left side L3-4 L4-5 L5-S1 and S1 medial branch block to the facet nerves under fluoroscopic guidance with moderate sedation    HPI DALISA FORRER presents for reevaluation. The quality characteristic and distribution of her pain are otherwise unchanged. Audrey Chan continues to have pain primarily left side more so than the right side although both sides of her low back seem to bother her. Audrey Chan desires to proceed with her first facet block today and the symptom quality characteristic and distribution are as previously documented. 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 No change in bowel bladder function or strength are reported today. Previously Audrey Chan has been seen by Dr. Aris Lot and found to be a non-surgical candidate. We have reviewed her MRI again today showing evidence of multilevel facet degeneration.   History Audrey Chan has a 2 year history of low back pain with radiation into the posterior leg and down the leg. Presently her right leg bothers her more than the left however Audrey Chan does have numbness and tingling affecting the left lateral foot. The pain Audrey Chan is describing is a maximum VAS of 10 and generally present throughout the day but not influenced by time of day. Aggravating factors include sitting standing squatting and twisting and walking for prolonged periods of time. Also lifting bothers her back. Alleviating factors include rest warm showers and lying down. The pain disturbs her ability to concentrate and is associated with left lateral lower leg numbness and tingling. No weakness or bowel or bladder dysfunction is noted. It is also described as sharp shooting generally initiating in the low back with radiation into the buttocks  and occasionally her hips worse on the right side presently. Audrey Chan has had previous epidurals without significant relief. Also Audrey Chan's had a recent MRI as noted below. It shows evidence of multilevel facet degeneration and a broad base disc bulge at L5-S1.  History Starlit has a past medical history of Allergic rhinitis; Cholelithiasis; Diabetes type 2, controlled ([place]); GERD (gastroesophageal reflux disease); Hypertension; Neuropathy ([place]); and Renal insufficiency (01/20/2016).   Audrey Chan has a past surgical history that includes Abdominal hysterectomy (1972); Cervical fusion; and Cholecystectomy (N/A, 12/28/2014).   Her family history includes Lung cancer in her father.Audrey Chan reports that Audrey Chan quit smoking about 17 years ago. Audrey Chan has quit using smokeless tobacco. Audrey Chan reports that Audrey Chan does not drink alcohol or use drugs.  No results found for this or any previous visit.  ToxAssure Select 13  Date Value Ref Range Status  02/29/2016 FINAL  Final    Comment:    ==================================================================== TOXASSURE SELECT 13 (MW) ==================================================================== Test                             Result       Flag       Units   NO DRUGS DETECTED. ==================================================================== Test                      Result    Flag   Units      Ref Range   Creatinine              25  mg/dL      >=20 ==================================================================== Declared Medications:  The flagging and interpretation on this report are based on the  following declared medications.  Unexpected results may arise from  inaccuracies in the declared medications.  **Note: The testing scope of this panel does not include following  reported medications:  Albuterol  Aspirin  Atorvastatin  Diclofenac  Fluticasone  Gabapentin  Hydrochlorothiazide  Insulin (Apidra)  Iron (Ferrous Sulfate)  Lisinopril   Metformin  Multivitamin  Pantoprazole  Ranitidine ==================================================================== For clinical consultation, please call [phone]. ====================================================================     Outpatient Medications Prior to Visit  Medication Sig Dispense Refill  . albuterol (PROVENTIL HFA;VENTOLIN HFA) 108 (90 BASE) MCG/ACT inhaler Inhale 2 puffs into the lungs every 6 (six) hours as needed for wheezing or shortness of breath.     Marland Kitchen aspirin EC 81 MG tablet Take 81 mg by mouth daily.    Marland Kitchen atorvastatin (LIPITOR) 40 MG tablet Take 40 mg by mouth daily at 6 PM.     . diclofenac sodium (VOLTAREN) 1 % GEL Apply 2 g topically 4 (four) times daily. 100 g 0  . ferrous sulfate 325 (65 FE) MG tablet Take 325 mg by mouth daily.    . fluticasone (FLONASE) 50 MCG/ACT nasal spray Place 2 sprays into both nostrils daily as needed for allergies or rhinitis.     Marland Kitchen gabapentin (NEURONTIN) 800 MG tablet Take 800 mg by mouth 3 (three) times daily.    . hydrochlorothiazide (HYDRODIURIL) 25 MG tablet Take 25 mg by mouth daily.    . insulin glulisine (APIDRA) 100 UNIT/ML injection Inject into the skin See admin instructions. Continuously via pump    . lisinopril (PRINIVIL,ZESTRIL) 10 MG tablet Take 10 mg by mouth daily.    . metFORMIN (GLUCOPHAGE-XR) 500 MG 24 hr tablet Take 500 mg by mouth 2 (two) times daily.    . Multiple Vitamin (MULTI-VITAMINS) TABS Take 1 tablet by mouth daily.    . pantoprazole (PROTONIX) 40 MG tablet Take 40 mg by mouth daily. Reported on 05/21/2015    . ranitidine (ZANTAC) 150 MG tablet Take 150 mg by mouth daily.      No facility-administered medications prior to visit.    Lab Results  Component Value Date   WBC 12.8 (H) 01/01/2015   HGB 11.7 (L) 01/01/2015   HCT 36.9 01/01/2015   PLT 400 01/01/2015   GLUCOSE 177 (H) 01/01/2015   ALT 62 (H) 01/01/2015   AST 72 (H) 01/01/2015   NA 136 01/01/2015   K 4.1 01/01/2015   CL 97  (L) 01/01/2015   CREATININE 0.95 01/01/2015   BUN 10 01/01/2015   CO2 31 01/01/2015    --------------------------------------------------------------------------------------------------------------------- Mr Lumbar Spine Wo Contrast  Result Date: 12/27/2015 CLINICAL DATA:  Low back pain extending at both hips and legs. Weakness in both legs. Numbness in both feet. EXAM: MRI LUMBAR SPINE WITHOUT CONTRAST TECHNIQUE: Multiplanar, multisequence MR imaging of the lumbar spine was performed. No intravenous contrast was administered. COMPARISON:  None. FINDINGS: Segmentation: 5 non rib-bearing lumbar type vertebral bodies are present. Alignment: Grade 1 anterolisthesis at L4-5 measures 5 mm. Mild leftward curvature is present. Vertebrae: Minimal endplate marrow changes present on the left at L5-S1. There are at least 4 discrete lesions within the L2 vertebral body. These all demonstrate T1 shortening consistent with a fat component. Other smaller hemangioma is are present throughout the lumbar spine otherwise. The lesions at L2 demonstrate some signal on the STIR sequence as well. This  is not seen in the other lesions. Conus medullaris: Extends to the L1-2 level and appears normal. Paraspinal and other soft tissues: Limited imaging the abdomen is unremarkable. There is no significant adenopathy. Paraspinous musculature is within normal limits. Disc levels: L1-2: Mild facet hypertrophy is present bilaterally. There is a perineural root sleeve cyst on the left. No significant stenosis is present. L2-3: A mild broad-based disc protrusion is present. Mild facet hypertrophy is noted bilaterally. Perineural root sleeve cyst is present on the right. No significant stenosis is evident. L3-4: A broad-based disc protrusion is present. Mild facet hypertrophy is noted. Mild left subarticular and bilateral foraminal narrowing is present. L4-5: This is the most severe stenosis. A broad-based disc protrusion is present. Advanced  facet hypertrophy is noted bilaterally. Moderate left and mild right subarticular and foraminal narrowing is present. L5-S1: A broad-based disc protrusion is present. Moderate facet hypertrophy is noted. Mild subarticular narrowing is worse on the right. Moderate foraminal narrowing is present bilaterally. IMPRESSION: 1. 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 These likely represent hemangioma is. Recommend follow-up MRI of the lumbar spine without and with contrast in 3- 6 months to assure stability of these lesions. Neoplasm is considered on likely, but not excluded. 2. Multilevel spondylosis of the lumbar spine is most severe at L4-5. 3. Moderate left and mild right subarticular and foraminal stenosis at L4-5 secondary to a broad-based disc protrusion and advanced facet hypertrophy. 4. 5 mm anterolisthesis at L4-5. Given the degree of facet hypertrophy, this may be dynamic. 5. Mild subarticular and moderate foraminal narrowing bilaterally at L5-S1 is worse on the right. 6. Mild left and bilateral foraminal stenosis at L3-4. Electronically Signed   By: San Morelle M.D.   On: 12/27/2015 08:28       ---------------------------------------------------------------------------------------------------------------------- Past Medical History:  Diagnosis Date  . Allergic rhinitis   . Cholelithiasis   . Diabetes type 2, controlled ([place])   . GERD (gastroesophageal reflux disease)   . Hypertension   . Neuropathy ([place])   . Renal insufficiency 01/20/2016    Past Surgical History:  Procedure Laterality Date  . ABDOMINAL HYSTERECTOMY  1972  . CERVICAL FUSION    . CHOLECYSTECTOMY N/A 12/28/2014   Procedure: LAPAROSCOPIC CHOLECYSTECTOMY;  Surgeon: Sherri Rad, MD;  Location: [facility] ORS;  Service: General;  Laterality: N/A;    Family History  Problem Relation Age of Onset  . Lung cancer Father     Social History   Substance Use Topics  . Smoking status: Former Smoker    Quit date: 02/21/1999  . Smokeless tobacco: Former Systems developer  . Alcohol use No    ---------------------------------------------------------------------------------------------------------------------- Social History   Social History  . Marital status: Widowed    Spouse name: N/A  . Number of children: N/A  . Years of education: N/A   Social History Main Topics  . Smoking status: Former Smoker    Quit date: 02/21/1999  . Smokeless tobacco: Former Systems developer  . Alcohol use No  . Drug use: No  . Sexual activity: Not Asked   Other Topics Concern  . None   Social History Narrative  . None    Scheduled Meds: Continuous Infusions: PRN Meds:.   BP (!) 169/72   Pulse 91   Temp 97.6 F (36.4 C)   Resp 14   Ht 5\' 7"  (1.702 m)   Wt 155 lb (70.3 kg)   SpO2 96%   BMI 24.28 kg/m  BP Readings from Last 3 Encounters:  05/11/16 (!) 169/72  04/15/16 (!) 141/73  02/29/16 138/77     Wt Readings from Last 3 Encounters:  05/11/16 155 lb (70.3 kg)  04/15/16 154 lb (69.9 kg)  02/29/16 153 lb (69.4 kg)     ----------------------------------------------------------------------------------------------------------------------  ROS Review of Systems  Cardiac: No angina GI: No complaints of constipation Psychologic: History of depression without drug abuse or suicidal ideation   Objective:  BP (!) 169/72   Pulse 91   Temp 97.6 F (36.4 C)   Resp 14   Ht 5\' 7"  (1.702 m)   Wt 155 lb (70.3 kg)   SpO2 96%   BMI 24.28 kg/m   Physical Exam Patient is a pleasant 66 year old black female alert oriented cooperative compliant Pupils equally round reactive to light extraocular muscles intact Heart regular rate and rhythm without murmur Lungs clear to auscultation Bilateral paraspinous muscle tenderness is notedAs on previous examination in the lumbar region with this patient standing. Audrey Chan has pain on extension with lateral  rotation right side more prominent than left. With the patient supine Audrey Chan has a negative straight leg raise bilaterally. Her strength appears to be 5 over 5 both proximal and distal. Audrey Chan does have diminished sensation over the left lateral lower foot.     Assessment & Plan:   Zaylin was seen today for back pain.  Diagnoses and all orders for this visit:  Chronic bilateral low back pain with bilateral sciatica  Facet arthritis of lumbar region ([place]) -     LUMBAR FACET(MEDIAL BRANCH NERVE BLOCK) MBNB -     triamcinolone acetonide (KENALOG-40) injection 40 mg; 1 mL (40 mg total) by Other route once. -     sodium chloride flush (NS) 0.9 % injection 10 mL; 10 mLs by Other route once. -     ropivacaine (PF) 5 mg/mL (0.5%) (NAROPIN) injection 10 mL; 10 mLs by Epidural route once. -     midazolam (VERSED) 5 MG/5ML injection 5 mg; Inject 5 mLs (5 mg total) into the vein once. -     lidocaine (PF) (XYLOCAINE) 1 % injection 5 mL; Inject 5 mLs into the skin once. -     lactated ringers infusion 1,000 mL; Inject 1,000 mLs into the vein continuous.  Sciatica of left side associated with disorder of lumbosacral spine -     LUMBAR FACET(MEDIAL BRANCH NERVE BLOCK) MBNB  Sciatica of right side associated with disorder of lumbar spine -     LUMBAR FACET(MEDIAL BRANCH NERVE BLOCK) MBNB  Chronic bilateral low back pain with right-sided sciatica -     LUMBAR FACET(MEDIAL BRANCH NERVE BLOCK) MBNB     ----------------------------------------------------------------------------------------------------------------------  Problem List Items Addressed This Visit    None    Visit Diagnoses    Chronic bilateral low back pain with bilateral sciatica    -  Primary   Relevant Medications   traMADol (ULTRAM) 50 MG tablet   aspirin EC 81 MG tablet   acetaminophen (TYLENOL) 500 MG tablet   HYDROcodone-acetaminophen (NORCO/VICODIN) 5-325 MG tablet   aspirin EC 81 MG tablet   triamcinolone acetonide  (KENALOG-40) injection 40 mg (Completed)   Facet arthritis of lumbar region ([facility])       Relevant Medications   traMADol (ULTRAM) 50 MG tablet   aspirin EC 81 MG tablet   acetaminophen (TYLENOL) 500 MG tablet   HYDROcodone-acetaminophen (NORCO/VICODIN) 5-325 MG tablet   aspirin EC 81 MG tablet   triamcinolone acetonide (KENALOG-40) injection 40  mg (Completed)   sodium chloride flush (NS) 0.9 % injection 10 mL   ropivacaine (PF) 5 mg/mL (0.5%) (NAROPIN) injection 10 mL (Completed)   midazolam (VERSED) 5 MG/5ML injection 5 mg (Completed)   lidocaine (PF) (XYLOCAINE) 1 % injection 5 mL   lactated ringers infusion 1,000 mL   Sciatica of left side associated with disorder of lumbosacral spine       Relevant Medications   gabapentin (NEURONTIN) 300 MG capsule   gabapentin (NEURONTIN) 300 MG capsule   midazolam (VERSED) 5 MG/5ML injection 5 mg (Completed)   Sciatica of right side associated with disorder of lumbar spine       Relevant Medications   gabapentin (NEURONTIN) 300 MG capsule   gabapentin (NEURONTIN) 300 MG capsule   midazolam (VERSED) 5 MG/5ML injection 5 mg (Completed)   Chronic bilateral low back pain with right-sided sciatica       Relevant Medications   traMADol (ULTRAM) 50 MG tablet   aspirin EC 81 MG tablet   acetaminophen (TYLENOL) 500 MG tablet   HYDROcodone-acetaminophen (NORCO/VICODIN) 5-325 MG tablet   aspirin EC 81 MG tablet   triamcinolone acetonide (KENALOG-40) injection 40 mg (Completed)      ----------------------------------------------------------------------------------------------------------------------  1. DDD (degenerative disc disease), lumbar We have prescribed physical therapy evaluation and treatment for assistance with stretching strengthening exercises for her chronic low back pain.  2. Facet arthritis of lumbar region [facility]) We will proceed with her first facet block today left-sided at L3-4 L4-5 L5-S1 and S1. The risks and benefits of an  reviewed with her in full detail all questions answered. We will have her return to clinic in 1 month for reevaluation and possible bilateral facet block for comparative evaluation at that time.  3. Sciatica of left side associated with disorder of lumbosacral spine  - Ambulatory referral to Physical Therapy  4. Sciatica of right side associated with disorder of lumbar spine  -- Ambulatory referral to Physical Therapy  5. Chronic bilateral low back pain with right-sided sciatica  - - Ambulatory referral to Physical Therapy    ----------------------------------------------------------------------------------------------------------------------  I am having Ms. Salina April maintain her albuterol, fluticasone, lisinopril, gabapentin, hydrochlorothiazide, insulin glulisine, MULTI-VITAMINS, ferrous sulfate, metFORMIN, aspirin EC, pantoprazole, atorvastatin, ranitidine, diclofenac sodium, glucose blood, cetirizine, Vitamin D3, polyethylene glycol powder, traMADol, aspirin EC, atorvastatin, fluticasone, gabapentin, hydrochlorothiazide, insulin glulisine, lisinopril, metFORMIN, MULTI-VITAMINS, ranitidine, albuterol, diclofenac sodium, acetaminophen, Vitamin D3, rosuvastatin, HYDROcodone-acetaminophen, ipratropium, albuterol, aspirin EC, atorvastatin, gabapentin, and MULTI-VITAMINS. We administered triamcinolone acetonide, ropivacaine (PF) 5 mg/mL (0.5%), midazolam, and lactated ringers.   Meds ordered this encounter  Medications  . glucose blood (BAYER CONTOUR TEST) test strip    Sig: Use 4 (four) times daily. Contour next strips  . cetirizine (ZYRTEC) 10 MG tablet    Sig: Take by mouth.  . Cholecalciferol (VITAMIN D3) 1000 units CAPS    Sig: Take by mouth.  . polyethylene glycol powder (GLYCOLAX/MIRALAX) powder  . traMADol (ULTRAM) 50 MG tablet    Sig: Take by mouth.  Marland Kitchen aspirin EC 81 MG tablet    Sig: Take by mouth.  Marland Kitchen atorvastatin (LIPITOR) 40 MG tablet    Sig: Take by mouth.  . fluticasone  (FLONASE) 50 MCG/ACT nasal spray  . gabapentin (NEURONTIN) 300 MG capsule    Sig: Take by mouth.  . hydrochlorothiazide (HYDRODIURIL) 25 MG tablet    Sig: Take by mouth.  . insulin glulisine (APIDRA) 100 UNIT/ML injection    Sig: Take up to 50 units daily in insulin pump, as  directed  . lisinopril (PRINIVIL,ZESTRIL) 10 MG tablet    Sig: Take by mouth.  . metFORMIN (GLUCOPHAGE-XR) 500 MG 24 hr tablet    Sig: Take by mouth.  . Multiple Vitamin (MULTI-VITAMINS) TABS    Sig: Take by mouth.  . ranitidine (ZANTAC) 150 MG tablet    Sig: Take by mouth.  Marland Kitchen albuterol (VENTOLIN HFA) 108 (90 Base) MCG/ACT inhaler  . diclofenac sodium (VOLTAREN) 1 % GEL  . acetaminophen (TYLENOL) 500 MG tablet    Sig: Take 500 mg by mouth.  . Cholecalciferol (VITAMIN D3) 1000 units CAPS    Sig: Take by mouth.  . rosuvastatin (CRESTOR) 20 MG tablet  . HYDROcodone-acetaminophen (NORCO/VICODIN) 5-325 MG tablet    Sig: Take by mouth.  Marland Kitchen ipratropium (ATROVENT HFA) 17 MCG/ACT inhaler    Sig: Inhale into the lungs.  Marland Kitchen albuterol (PROVENTIL HFA;VENTOLIN HFA) 108 (90 Base) MCG/ACT inhaler    Sig: Inhale into the lungs.  Marland Kitchen aspirin EC 81 MG tablet    Sig: Take 81 mg by mouth.  Marland Kitchen atorvastatin (LIPITOR) 40 MG tablet  . gabapentin (NEURONTIN) 300 MG capsule    Sig: Take 900 mg by mouth.  . Multiple Vitamin (MULTI-VITAMINS) TABS    Sig: Take by mouth.  . triamcinolone acetonide (KENALOG-40) injection 40 mg  . sodium chloride flush (NS) 0.9 % injection 10 mL  . ropivacaine (PF) 5 mg/mL (0.5%) (NAROPIN) injection 10 mL  . midazolam (VERSED) 5 MG/5ML injection 5 mg  . lidocaine (PF) (XYLOCAINE) 1 % injection 5 mL  . lactated ringers infusion 1,000 mL    Procedure: Left side facet block 1 under fluoroscopic guidance with moderate sedation at L3-4 L4-5 L5-S1 and S1   Patient was taken to the fluoroscopy suite and placed in prone position. A total dose of 1.5 mg of Versed with 0 cc of fentanyl were titrated for moderate  sedation. Vital signs were stable throughout the procedure. The  overlying area of skin on the  left side was prepped with Betadine 3 and strict aseptic technique was utilized throughout the procedure. Flouroscopy was used to I identify the areas overlying the aforementioned facets at L3-4  L4-5 and  L5-S1. 1% lidocaine 1 cc was infiltrated subcutaneously and into the fascia with a 25-gauge needle at each of these sites. I then advanced a 22-gauge 3-1/2 inch Quinckie needle with the needle tip to lie at the "[facility] portion of the [employer] dog". There was negative aspiration for heme or CSF and  no paresthesia. I then injected 2 cc of ropivacaine 0.2% mixed with10 mg of triamcinolone at each of the aforementioned sites. These needles were withdrawn and the L5-S1 needle was redirected towards the S1 posterior foramen. This was done without  paresthesia, there was negative aspiration and 2 cc of this same mixture was injected at this site. The patient was convalesced discharged home stable condition for follow-up as mentioned.   Follow-up: Return for evaluation, procedure.    Molli Barrows, MD   The [place] practitioner database for opioid medications on this patient has been reviewed by me and my staff   Greater than 50% of the total encounter time was spent in counseling and / or coordination of care.     This dictation was performed utilizing Systems analyst.  Please excuse any unintentional or mistaken typographical errors as a result.

## 2016-07-04 ENCOUNTER — Ambulatory Visit (HOSPITAL_BASED_OUTPATIENT_CLINIC_OR_DEPARTMENT_OTHER): Payer: Medicare PPO | Admitting: Anesthesiology

## 2016-07-04 ENCOUNTER — Encounter: Payer: Self-pay | Admitting: Anesthesiology

## 2016-07-04 ENCOUNTER — Other Ambulatory Visit: Payer: Self-pay | Admitting: Anesthesiology

## 2016-07-04 ENCOUNTER — Ambulatory Visit
Admission: RE | Admit: 2016-07-04 | Discharge: 2016-07-04 | Disposition: A | Discharge status: Home / Self Care | Payer: Medicare PPO | Source: Ambulatory Visit | Attending: Anesthesiology | Admitting: Anesthesiology

## 2016-07-04 VITALS — BP 131/87 | HR 72 | Temp 98.2°F | Resp 16 | Ht 67.0 in | Wt 148.0 lb

## 2016-07-04 DIAGNOSIS — M5442 Lumbago with sciatica, left side: Secondary | ICD-10-CM | POA: Diagnosis not present

## 2016-07-04 DIAGNOSIS — M5441 Lumbago with sciatica, right side: Secondary | ICD-10-CM | POA: Diagnosis not present

## 2016-07-04 DIAGNOSIS — M4696 Unspecified inflammatory spondylopathy, lumbar region: Secondary | ICD-10-CM | POA: Diagnosis not present

## 2016-07-04 DIAGNOSIS — R52 Pain, unspecified: Secondary | ICD-10-CM

## 2016-07-04 DIAGNOSIS — G8929 Other chronic pain: Secondary | ICD-10-CM | POA: Diagnosis not present

## 2016-07-04 DIAGNOSIS — M5136 Other intervertebral disc degeneration, lumbar region: Secondary | ICD-10-CM

## 2016-07-04 DIAGNOSIS — M47816 Spondylosis without myelopathy or radiculopathy, lumbar region: Secondary | ICD-10-CM

## 2016-07-04 MED ORDER — LACTATED RINGERS IV SOLN: 1000.0000 mL | INTRAVENOUS | Status: DC

## 2016-07-04 MED ORDER — SODIUM CHLORIDE 0.9% FLUSH: 10.0000 mL | Freq: Once | INTRAVENOUS | Status: DC

## 2016-07-04 MED ORDER — LIDOCAINE HCL (PF) 1 % IJ SOLN
5.0000 mL | Freq: Once | INTRAMUSCULAR | Status: AC
  Administered 2016-07-04: 5 mL via SUBCUTANEOUS
  Filled 2016-07-04: qty 10

## 2016-07-04 MED ORDER — MIDAZOLAM HCL 5 MG/5ML IJ SOLN
5.0000 mg | Freq: Once | INTRAMUSCULAR | Status: AC
  Administered 2016-07-04: 3 mg via INTRAVENOUS
  Filled 2016-07-04: qty 5

## 2016-07-04 MED ORDER — ROPIVACAINE HCL 2 MG/ML IJ SOLN
20.0000 mL | Freq: Once | INTRAMUSCULAR | Status: AC
  Administered 2016-07-04: 10 mL via EPIDURAL
  Filled 2016-07-04: qty 20

## 2016-07-04 MED ORDER — ROPIVACAINE HCL 2 MG/ML IJ SOLN
INTRAMUSCULAR | Status: AC
  Filled 2016-07-04: qty 20

## 2016-07-04 MED ORDER — TRIAMCINOLONE ACETONIDE 40 MG/ML IJ SUSP
80.0000 mg | Freq: Once | INTRAMUSCULAR | Status: AC
  Administered 2016-07-04: 40 mg
  Filled 2016-07-04: qty 2

## 2016-07-04 MED ORDER — SODIUM CHLORIDE 0.9 % IJ SOLN
INTRAMUSCULAR | Status: AC
  Filled 2016-07-04: qty 20

## 2016-07-04 NOTE — Patient Instructions (Signed)
Facet Joint Block The facet joints connect the bones of the spine (vertebrae). They make it possible for you to bend, twist, and make other movements with your spine. They also keep you from bending too far, twisting too far, and making other excessive movements. A facet joint block is a procedure where a numbing medicine (anesthetic) is injected into a facet joint. Often, a type of anti-inflammatory medicine called a steroid is also injected. A facet joint block may be done to diagnose neck or back pain. If the pain gets better after a facet joint block, it means the pain is probably coming from the facet joint. If the pain does not get better, it means the pain is probably not coming from the facet joint. A facet joint block may also be done to relieve neck or back pain caused by an inflamed facet joint. A facet joint block is only done to relieve pain if the pain does not improve with other methods, such as medicine, exercise programs, and physical therapy. Tell a health care provider about:  Any allergies you have.  All medicines you are taking, including vitamins, herbs, eye drops, creams, and over-the-counter medicines.  Any problems you or family members have had with anesthetic medicines.  Any blood disorders you have.  Any surgeries you have had.  Any medical conditions you have.  Whether you are pregnant or may be pregnant. What are the risks? Generally, this is a safe procedure. However, problems may occur, including:  Bleeding.  Injury to a nerve near the injection site.  Pain at the injection site.  Weakness or numbness in areas controlled by nerves near the injection site.  Infection.  Temporary fluid retention.  Allergic reactions to medicines or dyes.  Injury to other structures or organs near the injection site. What happens before the procedure?  Follow instructions from your health care provider about eating or drinking restrictions.  Ask your health care  provider about:  Changing or stopping your regular medicines. This is especially important if you are taking diabetes medicines or blood thinners.  Taking medicines such as aspirin and ibuprofen. These medicines can thin your blood. Do not take these medicines before your procedure if your health care provider instructs you not to.  Do not take any new dietary supplements or medicines without asking your health care provider first.  Plan to have someone take you home after the procedure. What happens during the procedure?  You may need to remove your clothing and dress in an open-back gown.  The procedure will be done while you are lying on an X-ray table. You will most likely be asked to lie on your stomach, but you may be asked to lie in a different position if an injection will be made in your neck.  Machines will be used to monitor your oxygen levels, heart rate, and blood pressure.  If an injection will be made in your neck, an IV tube will be inserted into one of your veins. Fluids and medicine will flow directly into your body through the IV tube.  The area over the facet joint where the injection will be made will be cleaned with soap. The surrounding skin will be covered with clean drapes.  A numbing medicine (local anesthetic) will be applied to your skin. Your skin may sting or burn for a moment.  A video X-ray machine (fluoroscopy) will be used to locate the joint. In some cases, a CT scan may be used.  A contrast  dye may be injected into the facet joint area to help locate the joint.  When the joint is located, an anesthetic will be injected into the joint through the needle.  Your health care provider will ask you whether you feel pain relief. If you do feel relief, a steroid may be injected to provide pain relief for a longer period of time. If you do not feel relief or feel only partial relief, additional injections of an anesthetic may be made in other facet  joints.  The needle will be removed.  Your skin will be cleaned.  A bandage (dressing) will be applied over each injection site. The procedure may vary among health care providers and hospitals. What happens after the procedure?  You will be observed for 15-30 minutes before being allowed to go home. This information is not intended to replace advice given to you by your health care provider. Make sure you discuss any questions you have with your health care provider. Document Released: 06/28/2006 Document Revised: 03/10/2015 Document Reviewed: 11/02/2014 Elsevier Interactive Patient Education  2017 [place]  What are the risk, side effects and possible complications? Generally speaking, most procedures are safe.  However, with any procedure there are risks, side effects, and the possibility of complications.  The risks and complications are dependent upon the sites that are lesioned, or the type of nerve block to be performed.  The closer the procedure is to the spine, the more serious the risks are.  Great care is taken when placing the radio frequency needles, block needles or lesioning probes, but sometimes complications can occur. 1. Infection: Any time there is an injection through the skin, there is a risk of infection.  This is why sterile conditions are used for these blocks.  There are four possible types of infection. 1. Localized skin infection. 2. Central Nervous System Infection-This can be in the form of Meningitis, which can be deadly. 3. Epidural Infections-This can be in the form of an epidural abscess, which can cause pressure inside of the spine, causing compression of the spinal cord with subsequent paralysis. This would require an emergency surgery to decompress, and there are no guarantees that the patient would recover from the paralysis. 4. Discitis-This is an infection of the intervertebral discs.  It occurs in about 1% of  discography procedures.  It is difficult to treat and it may lead to surgery.        2. Pain: the needles have to go through skin and soft tissues, will cause soreness.       3. Damage to internal structures:  The nerves to be lesioned may be near blood vessels or    other nerves which can be potentially damaged.       4. Bleeding: Bleeding is more common if the patient is taking blood thinners such as  aspirin, Coumadin, Ticiid, Plavix, etc., or if he/she have some genetic predisposition  such as hemophilia. Bleeding into the spinal canal can cause compression of the spinal  cord with subsequent paralysis.  This would require an emergency surgery to  decompress and there are no guarantees that the patient would recover from the  paralysis.       5. Pneumothorax:  Puncturing of a lung is a possibility, every time a needle is introduced in  the area of the chest or upper back.  Pneumothorax refers to free air around the  collapsed lung(s), inside of the thoracic cavity (chest cavity).  Another two possible  complications related to a similar event would include: Hemothorax and Chylothorax.   These are variations of the Pneumothorax, where instead of air around the collapsed  lung(s), you may have blood or chyle, respectively.       6. Spinal headaches: They may occur with any procedures in the area of the spine.       7. Persistent CSF (Cerebro-Spinal Fluid) leakage: This is a rare problem, but may occur  with prolonged intrathecal or epidural catheters either due to the formation of a fistulous  track or a dural tear.       8. Nerve damage: By working so close to the spinal cord, there is always a possibility of  nerve damage, which could be as serious as a permanent spinal cord injury with  paralysis.       9. Death:  Although rare, severe deadly allergic reactions known as "Anaphylactic  reaction" can occur to any of the medications used.      10. Worsening of the symptoms:  We can always make thing  worse.  What are the chances of something like this happening? Chances of any of this occuring are extremely low.  By statistics, you have more of a chance of getting killed in a motor vehicle accident: while driving to the hospital than any of the above occurring .  Nevertheless, you should be aware that they are possibilities.  In general, it is similar to taking a shower.  Everybody knows that you can slip, hit your head and get killed.  Does that mean that you should not shower again?  Nevertheless always keep in mind that statistics do not mean anything if you happen to be on the wrong side of them.  Even if a procedure has a 1 (one) in a 1,000,000 (million) chance of going wrong, it you happen to be that one..Also, keep in mind that by statistics, you have more of a chance of having something go wrong when taking medications.  Who should not have this procedure? If you are on a blood thinning medication (e.g. Coumadin, Plavix, see list of "Blood Thinners"), or if you have an active infection going on, you should not have the procedure.  If you are taking any blood thinners, please inform your physician.  How should I prepare for this procedure?  Do not eat or drink anything at least six hours prior to the procedure.  Bring a driver with you .  It cannot be a taxi.  Come accompanied by an adult that can drive you back, and that is strong enough to help you if your legs get weak or numb from the local anesthetic.  Take all of your medicines the morning of the procedure with just enough water to swallow them.  If you have diabetes, make sure that you are scheduled to have your procedure done first thing in the morning, whenever possible.  If you have diabetes, take only half of your insulin dose and notify our nurse that you have done so as soon as you arrive at the clinic.  If you are diabetic, but only take blood sugar pills (oral hypoglycemic), then do not take them on the morning of your  procedure.  You may take them after you have had the procedure.  Do not take aspirin or any aspirin-containing medications, at least eleven (11) days prior to the procedure.  They may prolong bleeding.  Wear loose fitting clothing that may be easy to  take off and that you would not mind if it got stained with Betadine or blood.  Do not wear any jewelry or perfume  Remove any nail coloring.  It will interfere with some of our monitoring equipment.  NOTE: Remember that this is not meant to be interpreted as a complete list of all possible complications.  Unforeseen problems may occur.  BLOOD THINNERS The following drugs contain aspirin or other products, which can cause increased bleeding during surgery and should not be taken for 2 weeks prior to and 1 week after surgery.  If you should need take something for relief of minor pain, you may take acetaminophen which is found in Tylenol,m Datril, Anacin-3 and Panadol. It is not blood thinner. The products listed below are.  Do not take any of the products listed below in addition to any listed on your instruction sheet.  A.P.C or A.P.C with Codeine Codeine Phosphate Capsules #3 Ibuprofen Ridaura  ABC compound Congesprin Imuran rimadil  Advil Cope Indocin Robaxisal  Alka-Seltzer Effervescent Pain Reliever and Antacid Coricidin or Coricidin-D  Indomethacin Rufen  Alka-Seltzer plus Cold Medicine Cosprin Ketoprofen S-A-C Tablets  Anacin Analgesic Tablets or Capsules Coumadin Korlgesic Salflex  Anacin Extra Strength Analgesic tablets or capsules CP-2 Tablets Lanoril Salicylate  Anaprox Cuprimine Capsules Levenox Salocol  Anexsia-D Dalteparin Magan Salsalate  Anodynos Darvon compound Magnesium Salicylate Sine-off  Ansaid Dasin Capsules Magsal Sodium Salicylate  Anturane Depen Capsules Marnal Soma  APF Arthritis pain formula Dewitt's Pills Measurin Stanback  Argesic Dia-Gesic Meclofenamic Sulfinpyrazone  Arthritis Bayer Timed Release Aspirin  Diclofenac Meclomen Sulindac  Arthritis pain formula Anacin Dicumarol Medipren Supac  Analgesic (Safety coated) Arthralgen Diffunasal Mefanamic Suprofen  Arthritis Strength Bufferin Dihydrocodeine Mepro Compound Suprol  Arthropan liquid Dopirydamole Methcarbomol with Aspirin Synalgos  ASA tablets/Enseals Disalcid Micrainin Tagament  Ascriptin Doan's Midol Talwin  Ascriptin A/D Dolene Mobidin Tanderil  Ascriptin Extra Strength Dolobid Moblgesic Ticlid  Ascriptin with Codeine Doloprin or Doloprin with Codeine Momentum Tolectin  Asperbuf Duoprin Mono-gesic Trendar  Aspergum Duradyne Motrin or Motrin IB Triminicin  Aspirin plain, buffered or enteric coated Durasal Myochrisine Trigesic  Aspirin Suppositories Easprin Nalfon Trillsate  Aspirin with Codeine Ecotrin Regular or Extra Strength Naprosyn Uracel  Atromid-S Efficin Naproxen Ursinus  Auranofin Capsules Elmiron Neocylate Vanquish  Axotal Emagrin Norgesic Verin  Azathioprine Empirin or Empirin with Codeine Normiflo Vitamin E  Azolid Emprazil Nuprin Voltaren  Bayer Aspirin plain, buffered or children's or timed BC Tablets or powders Encaprin Orgaran Warfarin Sodium  Buff-a-Comp Enoxaparin Orudis Zorpin  Buff-a-Comp with Codeine Equegesic Os-Cal-Gesic   Buffaprin Excedrin plain, buffered or Extra Strength Oxalid   Bufferin Arthritis Strength Feldene Oxphenbutazone   Bufferin plain or Extra Strength Feldene Capsules Oxycodone with Aspirin   Bufferin with Codeine Fenoprofen Fenoprofen Pabalate or Pabalate-SF   Buffets II Flogesic Panagesic   Buffinol plain or Extra Strength Florinal or Florinal with Codeine Panwarfarin   Buf-Tabs Flurbiprofen Penicillamine   Butalbital Compound Four-way cold tablets Penicillin   Butazolidin Fragmin Pepto-Bismol   Carbenicillin Geminisyn Percodan   Carna Arthritis Reliever Geopen Persantine   Carprofen Gold's salt Persistin   Chloramphenicol Goody's Phenylbutazone   Chloromycetin Haltrain Piroxlcam    Clmetidine heparin Plaquenil   Cllnoril Hyco-pap Ponstel   Clofibrate Hydroxy chloroquine Propoxyphen         Before stopping any of these medications, be sure to consult the physician who ordered them.  Some, such as Coumadin (Warfarin) are ordered to prevent or treat serious conditions such as "deep thrombosis", "pumonary   embolisms", and other heart problems.  The amount of time that you may need off of the medication may also vary with the medication and the reason for which you were taking it.  If you are taking any of these medications, please make sure you notify your pain physician before you undergo any procedures.

## 2016-07-04 NOTE — Progress Notes (Signed)
Safety precautions to be maintained throughout the outpatient stay will include: orient to surroundings, keep bed in low position, maintain call bell within reach at all times, provide assistance with transfer out of bed and ambulation.  

## 2016-07-05 NOTE — Progress Notes (Signed)
Subjective:  Patient ID: Audrey Chan, female    DOB: 03-22-1950  Age: 66 y.o. MRN: [id number]  CC: Back Pain (lower)   Service Provided on Last Visit: Procedure  PROCEDURE:Bilateral lumbar facet medial branch block #2 under fluoroscopic guidance and moderate sedation at L2-3 L3-4 L4-5 and L5-S1 Left side L3-4 L4-5 L5-S1   Previous procedure: Left side L3-4 L4-5 L5-S1 and S1 medial branch block    HPI Audrey Chan presents for reevaluation. She was last seen approximately a month ago and she reports a 50% improvement in her left low back pain following the injection. She's having some considerable pain on the right side and would like to undergo an injection for both sides today. Otherwise the quality characteristic and distribution of her low back pain are unchanged. No change in lower extremity strength or function are noted at this time.   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 No change in bowel bladder function or strength are reported today.   Previously she has been seen by Dr. Aris Lot and found to be a non-surgical candidate. We have reviewed her MRI again today showing evidence of multilevel facet degeneration. By History she has a 2 year history of low back pain with radiation into the posterior leg and down the leg.   History Loris has a past medical history of Allergic rhinitis; Cholelithiasis; Diabetes type 2, controlled ([place]); GERD (gastroesophageal reflux disease); Hypertension; Neuropathy; and Renal insufficiency (01/20/2016).   She has a past surgical history that includes Abdominal hysterectomy (1972); Cervical fusion; and Cholecystectomy (N/A, 12/28/2014).   Her family history includes Lung cancer in her father.She reports that she quit smoking about 17 years ago. She has quit using smokeless tobacco. She reports that she does not drink alcohol or use drugs.  No results found  for this or any previous visit.  ToxAssure Select 13  Date Value Ref Range Status  02/29/2016 FINAL  Final    Comment:    ==================================================================== TOXASSURE SELECT 13 (MW) ==================================================================== Test                             Result       Flag       Units   NO DRUGS DETECTED. ==================================================================== Test                      Result    Flag   Units      Ref Range   Creatinine              25               mg/dL      >=20 ==================================================================== Declared Medications:  The flagging and interpretation on this report are based on the  following declared medications.  Unexpected results may arise from  inaccuracies in the declared medications.  **Note: The testing scope of this panel does not include following  reported medications:  Albuterol  Aspirin  Atorvastatin  Diclofenac  Fluticasone  Gabapentin  Hydrochlorothiazide  Insulin (Apidra)  Iron (Ferrous Sulfate)  Lisinopril  Metformin  Multivitamin  Pantoprazole  Ranitidine ==================================================================== For clinical consultation, please call [phone]. ====================================================================     Outpatient Medications Prior to Visit  Medication Sig Dispense Refill  . acetaminophen (TYLENOL) 500 MG tablet Take 500 mg by mouth.    Marland Kitchen  albuterol (PROVENTIL HFA;VENTOLIN HFA) 108 (90 BASE) MCG/ACT inhaler Inhale 2 puffs into the lungs every 6 (six) hours as needed for wheezing or shortness of breath.     Marland Kitchen aspirin EC 81 MG tablet Take 81 mg by mouth daily.    Marland Kitchen atorvastatin (LIPITOR) 40 MG tablet Take 40 mg by mouth daily at 6 PM.     . cetirizine (ZYRTEC) 10 MG tablet Take by mouth.    . Cholecalciferol (VITAMIN D3) 1000 units CAPS Take by mouth.    . diclofenac sodium  (VOLTAREN) 1 % GEL Apply 2 g topically 4 (four) times daily. 100 g 0  . ferrous sulfate 325 (65 FE) MG tablet Take 325 mg by mouth daily.    Marland Kitchen gabapentin (NEURONTIN) 300 MG capsule Take 900 mg by mouth.    Marland Kitchen glucose blood (BAYER CONTOUR TEST) test strip Use 4 (four) times daily. Contour next strips    . hydrochlorothiazide (HYDRODIURIL) 25 MG tablet Take 25 mg by mouth daily.    . insulin glulisine (APIDRA) 100 UNIT/ML injection Inject into the skin See admin instructions. Continuously via pump    . insulin glulisine (APIDRA) 100 UNIT/ML injection Take up to 50 units daily in insulin pump, as directed    . ipratropium (ATROVENT HFA) 17 MCG/ACT inhaler Inhale into the lungs.    Marland Kitchen lisinopril (PRINIVIL,ZESTRIL) 10 MG tablet Take 10 mg by mouth daily.    . metFORMIN (GLUCOPHAGE-XR) 500 MG 24 hr tablet Take 500 mg by mouth 2 (two) times daily.    . Multiple Vitamin (MULTI-VITAMINS) TABS Take 1 tablet by mouth daily.    . pantoprazole (PROTONIX) 40 MG tablet Take 40 mg by mouth daily. Reported on 05/21/2015    . polyethylene glycol powder (GLYCOLAX/MIRALAX) powder     . ranitidine (ZANTAC) 150 MG tablet Take 150 mg by mouth daily.     Marland Kitchen albuterol (PROVENTIL HFA;VENTOLIN HFA) 108 (90 Base) MCG/ACT inhaler Inhale into the lungs.    Marland Kitchen albuterol (VENTOLIN HFA) 108 (90 Base) MCG/ACT inhaler     . aspirin EC 81 MG tablet Take by mouth.    Marland Kitchen aspirin EC 81 MG tablet Take 81 mg by mouth.    Marland Kitchen atorvastatin (LIPITOR) 40 MG tablet Take by mouth.    Marland Kitchen atorvastatin (LIPITOR) 40 MG tablet     . Cholecalciferol (VITAMIN D3) 1000 units CAPS Take by mouth.    . diclofenac sodium (VOLTAREN) 1 % GEL     . fluticasone (FLONASE) 50 MCG/ACT nasal spray Place 2 sprays into both nostrils daily as needed for allergies or rhinitis.     . fluticasone (FLONASE) 50 MCG/ACT nasal spray     . gabapentin (NEURONTIN) 300 MG capsule Take by mouth.    . gabapentin (NEURONTIN) 800 MG tablet Take 800 mg by mouth 3 (three) times daily.     . hydrochlorothiazide (HYDRODIURIL) 25 MG tablet Take by mouth.    Marland Kitchen HYDROcodone-acetaminophen (NORCO/VICODIN) 5-325 MG tablet Take by mouth.    Marland Kitchen lisinopril (PRINIVIL,ZESTRIL) 10 MG tablet Take by mouth.    . metFORMIN (GLUCOPHAGE-XR) 500 MG 24 hr tablet Take by mouth.    . Multiple Vitamin (MULTI-VITAMINS) TABS Take by mouth.    . Multiple Vitamin (MULTI-VITAMINS) TABS Take by mouth.    . ranitidine (ZANTAC) 150 MG tablet Take by mouth.    . rosuvastatin (CRESTOR) 20 MG tablet     . traMADol (ULTRAM) 50 MG tablet Take by mouth.     No facility-administered  medications prior to visit.    Lab Results  Component Value Date   WBC 12.8 (H) 01/01/2015   HGB 11.7 (L) 01/01/2015   HCT 36.9 01/01/2015   PLT 400 01/01/2015   GLUCOSE 177 (H) 01/01/2015   ALT 62 (H) 01/01/2015   AST 72 (H) 01/01/2015   NA 136 01/01/2015   K 4.1 01/01/2015   CL 97 (L) 01/01/2015   CREATININE 0.95 01/01/2015   BUN 10 01/01/2015   CO2 31 01/01/2015    --------------------------------------------------------------------------------------------------------------------- Mr Lumbar Spine Wo Contrast  Result Date: 12/27/2015 CLINICAL DATA:  Low back pain extending at both hips and legs. Weakness in both legs. Numbness in both feet. EXAM: MRI LUMBAR SPINE WITHOUT CONTRAST TECHNIQUE: Multiplanar, multisequence MR imaging of the lumbar spine was performed. No intravenous contrast was administered. COMPARISON:  None. FINDINGS: Segmentation: 5 non rib-bearing lumbar type vertebral bodies are present. Alignment: Grade 1 anterolisthesis at L4-5 measures 5 mm. Mild leftward curvature is present. Vertebrae: Minimal endplate marrow changes present on the left at L5-S1. There are at least 4 discrete lesions within the L2 vertebral body. These all demonstrate T1 shortening consistent with a fat component. Other smaller hemangioma is are present throughout the lumbar spine otherwise. The lesions at L2 demonstrate some signal on  the STIR sequence as well. This is not seen in the other lesions. Conus medullaris: Extends to the L1-2 level and appears normal. Paraspinal and other soft tissues: Limited imaging the abdomen is unremarkable. There is no significant adenopathy. Paraspinous musculature is within normal limits. Disc levels: L1-2: Mild facet hypertrophy is present bilaterally. There is a perineural root sleeve cyst on the left. No significant stenosis is present. L2-3: A mild broad-based disc protrusion is present. Mild facet hypertrophy is noted bilaterally. Perineural root sleeve cyst is present on the right. No significant stenosis is evident. L3-4: A broad-based disc protrusion is present. Mild facet hypertrophy is noted. Mild left subarticular and bilateral foraminal narrowing is present. L4-5: This is the most severe stenosis. A broad-based disc protrusion is present. Advanced facet hypertrophy is noted bilaterally. Moderate left and mild right subarticular and foraminal narrowing is present. L5-S1: A broad-based disc protrusion is present. Moderate facet hypertrophy is noted. Mild subarticular narrowing is worse on the right. Moderate foraminal narrowing is present bilaterally. IMPRESSION: 1. 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 These likely represent hemangioma is. Recommend follow-up MRI of the lumbar spine without and with contrast in 3- 6 months to assure stability of these lesions. Neoplasm is considered on likely, but not excluded. 2. Multilevel spondylosis of the lumbar spine is most severe at L4-5. 3. Moderate left and mild right subarticular and foraminal stenosis at L4-5 secondary to a broad-based disc protrusion and advanced facet hypertrophy. 4. 5 mm anterolisthesis at L4-5. Given the degree of facet hypertrophy, this may be dynamic. 5. Mild subarticular and moderate foraminal narrowing bilaterally at L5-S1 is worse on the right. 6. Mild  left and bilateral foraminal stenosis at L3-4. Electronically Signed   By: San Morelle M.D.   On: 12/27/2015 08:28       ---------------------------------------------------------------------------------------------------------------------- Past Medical History:  Diagnosis Date  . Allergic rhinitis   . Cholelithiasis   . Diabetes type 2, controlled ([place])   . GERD (gastroesophageal reflux disease)   . Hypertension   . Neuropathy   . Renal insufficiency 01/20/2016    Past Surgical History:  Procedure Laterality Date  . ABDOMINAL HYSTERECTOMY  [place]    . CHOLECYSTECTOMY N/A 12/28/2014   Procedure: LAPAROSCOPIC CHOLECYSTECTOMY;  Surgeon: Sherri Rad, MD;  Location: [facility] ORS;  Service: General;  Laterality: N/A;    Family History  Problem Relation Age of Onset  . Lung cancer Father     Social History  Substance Use Topics  . Smoking status: Former Smoker    Quit date: 02/21/1999  . Smokeless tobacco: Former Systems developer  . Alcohol use No    ---------------------------------------------------------------------------------------------------------------------- Social History   Social History  . Marital status: Widowed    Spouse name: N/A  . Number of children: N/A  . Years of education: N/A   Social History Main Topics  . Smoking status: Former Smoker    Quit date: 02/21/1999  . Smokeless tobacco: Former Systems developer  . Alcohol use No  . Drug use: No  . Sexual activity: Not Asked   Other Topics Concern  . None   Social History Narrative  . None    Scheduled Meds: Continuous Infusions: PRN Meds:.   BP 131/87   Pulse 72   Temp 98.2 F (36.8 C)   Resp 16   Ht 5\' 7"  (1.702 m)   Wt 148 lb (67.1 kg)   SpO2 97%   BMI 23.18 kg/m    BP Readings from Last 3 Encounters:  07/04/16 131/87  05/11/16 (!) 169/72  04/15/16 (!) 141/73     Wt Readings from Last 3 Encounters:  07/04/16 148 lb (67.1 kg)  05/11/16 155 lb (70.3 kg)  04/15/16 154 lb (69.9  kg)     ----------------------------------------------------------------------------------------------------------------------  ROS Review of Systems  Cardiac: No angina GI: No complaints of constipation Psychologic: History of depression without drug abuse or suicidal ideationOtherwise no changes   Objective:  BP 131/87   Pulse 72   Temp 98.2 F (36.8 C)   Resp 16   Ht 5\' 7"  (1.702 m)   Wt 148 lb (67.1 kg)   SpO2 97%   BMI 23.18 kg/m   Physical Exam Patient is a pleasant 66 year old black female alert oriented cooperative compliant Pupils equally round reactive to light extraocular muscles intact Heart regular rate and rhythm without murmur Lungs clear to auscultation She has pain in the standing position with bilateral extension at the low back similar to previous evaluation.    Assessment & Plan:   Katura was seen today for back pain.  Diagnoses and all orders for this visit:  Facet arthritis of lumbar region ([place]) -     LUMBAR FACET(MEDIAL [place] [place]) MBNB; Future -     triamcinolone acetonide (KENALOG-40) injection 80 mg; 2 mLs (80 mg total) by Other route once. -     sodium chloride flush (NS) 0.9 % injection 10 mL; 10 mLs by Other route once. -     ropivacaine (PF) 2 mg/mL (0.2%) (NAROPIN) injection 20 mL; 20 mLs by Epidural route once. -     midazolam (VERSED) 5 MG/5ML injection 5 mg; Inject 5 mLs (5 mg total) into the vein once. -     lidocaine (PF) (XYLOCAINE) 1 % injection 5 mL; Inject 5 mLs into the skin once. -     lactated ringers infusion 1,000 mL; Inject 1,000 mLs into the vein continuous.  Chronic bilateral low back pain with bilateral sciatica -     LUMBAR FACET(MEDIAL BRANCH NERVE BLOCK) MBNB; Future -     triamcinolone acetonide (KENALOG-40) injection  80 mg; 2 mLs (80 mg total) by Other route once. -     sodium chloride flush (NS) 0.9 % injection 10 mL; 10 mLs by Other route once. -      ropivacaine (PF) 2 mg/mL (0.2%) (NAROPIN) injection 20 mL; 20 mLs by Epidural route once. -     midazolam (VERSED) 5 MG/5ML injection 5 mg; Inject 5 mLs (5 mg total) into the vein once. -     lidocaine (PF) (XYLOCAINE) 1 % injection 5 mL; Inject 5 mLs into the skin once. -     lactated ringers infusion 1,000 mL; Inject 1,000 mLs into the vein continuous.  DDD (degenerative disc disease), lumbar     ----------------------------------------------------------------------------------------------------------------------  Problem List Items Addressed This Visit    None    Visit Diagnoses    Facet arthritis of lumbar region [facility])    -  Primary   Relevant Medications   triamcinolone acetonide (KENALOG-40) injection 80 mg (Completed)   sodium chloride flush (NS) 0.9 % injection 10 mL   ropivacaine (PF) 2 mg/mL (0.2%) (NAROPIN) injection 20 mL (Completed)   midazolam (VERSED) 5 MG/5ML injection 5 mg (Completed)   lidocaine (PF) (XYLOCAINE) 1 % injection 5 mL (Completed)   lactated ringers infusion 1,000 mL   Other Relevant Orders   LUMBAR FACET(MEDIAL BRANCH NERVE BLOCK) MBNB   LUMBAR FACET(MEDIAL BRANCH NERVE BLOCK) MBNB   Chronic bilateral low back pain with bilateral sciatica       Relevant Medications   triamcinolone acetonide (KENALOG-40) injection 80 mg (Completed)   sodium chloride flush (NS) 0.9 % injection 10 mL   ropivacaine (PF) 2 mg/mL (0.2%) (NAROPIN) injection 20 mL (Completed)   midazolam (VERSED) 5 MG/5ML injection 5 mg (Completed)   lidocaine (PF) (XYLOCAINE) 1 % injection 5 mL (Completed)   lactated ringers infusion 1,000 mL   Other Relevant Orders   LUMBAR FACET(MEDIAL BRANCH NERVE BLOCK) MBNB   DDD (degenerative disc disease), lumbar       Relevant Medications   triamcinolone acetonide (KENALOG-40) injection 80 mg (Completed)      ----------------------------------------------------------------------------------------------------------------------  1. DDD  (degenerative disc disease), lumbar Continue physical therapy stretching strengthening exercises  2. Facet arthritis of lumbar region [facility]) We'll proceed with a bilateral lumbar facet block for diagnostic and therapeutic purpose today. The risks and benefits been once again reviewed she is to return to clinic in 1 month for reevaluation possible repeat injection at that time. Ultimately if she continues to respond favorably she may be a candidate for a radiofrequency ablation if indicated  3. Sciatica of left side associated with disorder of lumbosacral spine  - Ambulatory referral to Physical Therapy  4. Sciatica of right side associated with disorder of lumbar spine  -- Ambulatory referral to Physical Therapy  5. Chronic bilateral low back pain with right-sided sciatica  - - Ambulatory referral to Physical Therapy    ----------------------------------------------------------------------------------------------------------------------  I am having Ms. Salina April maintain her albuterol, fluticasone, lisinopril, gabapentin, hydrochlorothiazide, insulin glulisine, MULTI-VITAMINS, ferrous sulfate, metFORMIN, aspirin EC, pantoprazole, atorvastatin, ranitidine, diclofenac sodium, glucose blood, cetirizine, Vitamin D3, polyethylene glycol powder, traMADol, aspirin EC, atorvastatin, fluticasone, gabapentin, hydrochlorothiazide, insulin glulisine, lisinopril, metFORMIN, MULTI-VITAMINS, ranitidine, albuterol, diclofenac sodium, acetaminophen, Vitamin D3, rosuvastatin, HYDROcodone-acetaminophen, ipratropium, albuterol, aspirin EC, atorvastatin, gabapentin, and MULTI-VITAMINS. We administered triamcinolone acetonide, ropivacaine (PF) 2 mg/mL (0.2%), midazolam, and lidocaine (PF).   Meds ordered this encounter  Medications  . triamcinolone acetonide (KENALOG-40) injection 80 mg  . sodium chloride flush (NS) 0.9 % injection 10  mL  . ropivacaine (PF) 2 mg/mL (0.2%) (NAROPIN) injection 20 mL  . midazolam  (VERSED) 5 MG/5ML injection 5 mg  . lidocaine (PF) (XYLOCAINE) 1 % injection 5 mL  . lactated ringers infusion 1,000 mL    Procedure: Bilateral side facet block #2 under fluoroscopic guidance with moderate sedation at L3-4 L4-5 L5-S1   Procedure: Bilateral lumbar medial branch block under fluoroscopic guidance at L3-4 L4-5 L5-S1 with sedation Patient was taken to the fluoroscopy suite and placed in the prone position. A total dose of 3 mg of Versed with 0 cc of fentanyl was titrated for moderate sedation. Vital signs were stable throughout the procedure. The  overlying area of skin was prepped with Betadine 3 and strict aseptic technique was utilized throughout the procedure. Flouroscopy was used to  identify the areas overlying the aforementioned facets at  L2-3 L3-4  L4-5 and  L5-S1. 1% lidocaine 1 cc was infiltrated subcutaneously and into the fascia with a 25-gauge needle at each of these sites. I then advanced a 22-gauge 3-1/2 inch Quinckie needle with the needle tip to lie at the "[facility]" portion of the [employer] dog". There was negative aspiration for heme or CSF and  no paresthesia. I then injected 2 cc of ropivacaine 0.2% mixed with 10 mg of triamcinolone at each of the aforementioned sites. These needles were withdrawn.  On the opposite side, I then injected 1% lidocaine as above and advanced 22-gauge Quinky needles as before to the [place] eye portion of the [employer] dog". These needles were placed without paresthesia and with strict aseptic technique and negative aspiration. As before I injected 2 cc of ropivacaine .2% mixed with 10 mg triamcinolone at each site without evidence of IV or subarachnoid symptoms. These needles were withdrawn. The patient was convalesced discharged home in stable condition  Dr. Vashti Hey M.D.  Follow-up: Return in about 1 month (around 08/04/2016) for evaluation, procedure.    Molli Barrows, MD   The [place] practitioner database for opioid medications on  this patient has been reviewed by me and my staff   Greater than 50% of the total encounter time was spent in counseling and / or coordination of care.     This dictation was performed utilizing Systems analyst.  Please excuse any unintentional or mistaken typographical errors as a result.

## 2016-08-14 ENCOUNTER — Ambulatory Visit: Payer: Medicare PPO | Admitting: Anesthesiology

## 2016-08-17 ENCOUNTER — Ambulatory Visit: Payer: Medicare PPO | Attending: Anesthesiology | Admitting: Anesthesiology

## 2016-08-17 ENCOUNTER — Encounter: Payer: Self-pay | Admitting: Anesthesiology

## 2016-08-17 VITALS — BP 155/87 | HR 101 | Temp 97.8°F | Resp 16 | Ht 67.0 in | Wt 148.0 lb

## 2016-08-17 DIAGNOSIS — Z801 Family history of malignant neoplasm of trachea, bronchus and lung: Secondary | ICD-10-CM | POA: Insufficient documentation

## 2016-08-17 DIAGNOSIS — M25552 Pain in left hip: Secondary | ICD-10-CM | POA: Diagnosis not present

## 2016-08-17 DIAGNOSIS — M1388 Other specified arthritis, other site: Secondary | ICD-10-CM | POA: Diagnosis not present

## 2016-08-17 DIAGNOSIS — N289 Disorder of kidney and ureter, unspecified: Secondary | ICD-10-CM | POA: Diagnosis not present

## 2016-08-17 DIAGNOSIS — Z981 Arthrodesis status: Secondary | ICD-10-CM | POA: Diagnosis not present

## 2016-08-17 DIAGNOSIS — M25551 Pain in right hip: Secondary | ICD-10-CM | POA: Diagnosis not present

## 2016-08-17 DIAGNOSIS — Z7951 Long term (current) use of inhaled steroids: Secondary | ICD-10-CM | POA: Insufficient documentation

## 2016-08-17 DIAGNOSIS — M47816 Spondylosis without myelopathy or radiculopathy, lumbar region: Secondary | ICD-10-CM

## 2016-08-17 DIAGNOSIS — M5441 Lumbago with sciatica, right side: Secondary | ICD-10-CM | POA: Diagnosis not present

## 2016-08-17 DIAGNOSIS — Z794 Long term (current) use of insulin: Secondary | ICD-10-CM | POA: Insufficient documentation

## 2016-08-17 DIAGNOSIS — M5442 Lumbago with sciatica, left side: Secondary | ICD-10-CM | POA: Diagnosis not present

## 2016-08-17 DIAGNOSIS — E1140 Type 2 diabetes mellitus with diabetic neuropathy, unspecified: Secondary | ICD-10-CM | POA: Diagnosis not present

## 2016-08-17 DIAGNOSIS — G8929 Other chronic pain: Secondary | ICD-10-CM

## 2016-08-17 DIAGNOSIS — Z7982 Long term (current) use of aspirin: Secondary | ICD-10-CM | POA: Insufficient documentation

## 2016-08-17 DIAGNOSIS — M545 Low back pain: Secondary | ICD-10-CM | POA: Diagnosis present

## 2016-08-17 DIAGNOSIS — M5136 Other intervertebral disc degeneration, lumbar region: Secondary | ICD-10-CM | POA: Diagnosis not present

## 2016-08-17 DIAGNOSIS — K219 Gastro-esophageal reflux disease without esophagitis: Secondary | ICD-10-CM | POA: Insufficient documentation

## 2016-08-17 DIAGNOSIS — M4696 Unspecified inflammatory spondylopathy, lumbar region: Secondary | ICD-10-CM | POA: Diagnosis not present

## 2016-08-17 DIAGNOSIS — Z87891 Personal history of nicotine dependence: Secondary | ICD-10-CM | POA: Insufficient documentation

## 2016-08-17 DIAGNOSIS — Z79899 Other long term (current) drug therapy: Secondary | ICD-10-CM | POA: Diagnosis not present

## 2016-08-17 DIAGNOSIS — I10 Essential (primary) hypertension: Secondary | ICD-10-CM | POA: Diagnosis not present

## 2016-08-17 MED ORDER — TRAMADOL HCL 50 MG PO TABS: 50.0000 mg | ORAL_TABLET | Freq: Two times a day (BID) | ORAL | 1 refills | Status: DC

## 2016-08-17 NOTE — Progress Notes (Signed)
Safety precautions to be maintained throughout the outpatient stay will include: orient to surroundings, keep bed in low position, maintain call bell within reach at all times, provide assistance with transfer out of bed and ambulation.  

## 2016-08-17 NOTE — Patient Instructions (Signed)
GENERAL RISKS AND COMPLICATIONS  What are the risk, side effects and possible complications? Generally speaking, most procedures are safe.  However, with any procedure there are risks, side effects, and the possibility of complications.  The risks and complications are dependent upon the sites that are lesioned, or the type of nerve block to be performed.  The closer the procedure is to the spine, the more serious the risks are.  Great care is taken when placing the radio frequency needles, block needles or lesioning probes, but sometimes complications can occur. 1. Infection: Any time there is an injection through the skin, there is a risk of infection.  This is why sterile conditions are used for these blocks.  There are four possible types of infection. 1. Localized skin infection. 2. Central Nervous System Infection-This can be in the form of Meningitis, which can be deadly. 3. Epidural Infections-This can be in the form of an epidural abscess, which can cause pressure inside of the spine, causing compression of the spinal cord with subsequent paralysis. This would require an emergency surgery to decompress, and there are no guarantees that the patient would recover from the paralysis. 4. Discitis-This is an infection of the intervertebral discs.  It occurs in about 1% of discography procedures.  It is difficult to treat and it may lead to surgery.        2. Pain: the needles have to go through skin and soft tissues, will cause soreness.       3. Damage to internal structures:  The nerves to be lesioned may be near blood vessels or    other nerves which can be potentially damaged.       4. Bleeding: Bleeding is more common if the patient is taking blood thinners such as  aspirin, Coumadin, Ticiid, Plavix, etc., or if he/she have some genetic predisposition  such as hemophilia. Bleeding into the spinal canal can cause compression of the spinal  cord with subsequent paralysis.  This would require an  emergency surgery to  decompress and there are no guarantees that the patient would recover from the  paralysis.       5. Pneumothorax:  Puncturing of a lung is a possibility, every time a needle is introduced in  the area of the chest or upper back.  Pneumothorax refers to free air around the  collapsed lung(s), inside of the thoracic cavity (chest cavity).  Another two possible  complications related to a similar event would include: Hemothorax and Chylothorax.   These are variations of the Pneumothorax, where instead of air around the collapsed  lung(s), you may have blood or chyle, respectively.       6. Spinal headaches: They may occur with any procedures in the area of the spine.       7. Persistent CSF (Cerebro-Spinal Fluid) leakage: This is a rare problem, but may occur  with prolonged intrathecal or epidural catheters either due to the formation of a fistulous  track or a dural tear.       8. Nerve damage: By working so close to the spinal cord, there is always a possibility of  nerve damage, which could be as serious as a permanent spinal cord injury with  paralysis.       9. Death:  Although rare, severe deadly allergic reactions known as "Anaphylactic  reaction" can occur to any of the medications used.      10. Worsening of the symptoms:  We can always make thing worse.    What are the chances of something like this happening? Chances of any of this occuring are extremely low.  By statistics, you have more of a chance of getting killed in a motor vehicle accident: while driving to the hospital than any of the above occurring .  Nevertheless, you should be aware that they are possibilities.  In general, it is similar to taking a shower.  Everybody knows that you can slip, hit your head and get killed.  Does that mean that you should not shower again?  Nevertheless always keep in mind that statistics do not mean anything if you happen to be on the wrong side of them.  Even if a procedure has a 1  (one) in a 1,000,000 (million) chance of going wrong, it you happen to be that one..Also, keep in mind that by statistics, you have more of a chance of having something go wrong when taking medications.  Who should not have this procedure? If you are on a blood thinning medication (e.g. Coumadin, Plavix, see list of "Blood Thinners"), or if you have an active infection going on, you should not have the procedure.  If you are taking any blood thinners, please inform your physician.  How should I prepare for this procedure?  Do not eat or drink anything at least six hours prior to the procedure.  Bring a driver with you .  It cannot be a taxi.  Come accompanied by an adult that can drive you back, and that is strong enough to help you if your legs get weak or numb from the local anesthetic.  Take all of your medicines the morning of the procedure with just enough water to swallow them.  If you have diabetes, make sure that you are scheduled to have your procedure done first thing in the morning, whenever possible.  If you have diabetes, take only half of your insulin dose and notify our nurse that you have done so as soon as you arrive at the clinic.  If you are diabetic, but only take blood sugar pills (oral hypoglycemic), then do not take them on the morning of your procedure.  You may take them after you have had the procedure.  Do not take aspirin or any aspirin-containing medications, at least eleven (11) days prior to the procedure.  They may prolong bleeding.  Wear loose fitting clothing that may be easy to take off and that you would not mind if it got stained with Betadine or blood.  Do not wear any jewelry or perfume  Remove any nail coloring.  It will interfere with some of our monitoring equipment.  NOTE: Remember that this is not meant to be interpreted as a complete list of all possible complications.  Unforeseen problems may occur.  BLOOD THINNERS The following drugs  contain aspirin or other products, which can cause increased bleeding during surgery and should not be taken for 2 weeks prior to and 1 week after surgery.  If you should need take something for relief of minor pain, you may take acetaminophen which is found in Tylenol,m Datril, Anacin-3 and Panadol. It is not blood thinner. The products listed below are.  Do not take any of the products listed below in addition to any listed on your instruction sheet.  A.P.C or A.P.C with Codeine Codeine Phosphate Capsules #3 Ibuprofen Ridaura  ABC compound Congesprin Imuran rimadil  Advil Cope Indocin Robaxisal  Alka-Seltzer Effervescent Pain Reliever and Antacid Coricidin or Coricidin-D  Indomethacin Rufen    Alka-Seltzer plus Cold Medicine Cosprin Ketoprofen S-A-C Tablets  Anacin Analgesic Tablets or Capsules Coumadin Korlgesic Salflex  Anacin Extra Strength Analgesic tablets or capsules CP-2 Tablets Lanoril Salicylate  Anaprox Cuprimine Capsules Levenox Salocol  Anexsia-D Dalteparin Magan Salsalate  Anodynos Darvon compound Magnesium Salicylate Sine-off  Ansaid Dasin Capsules Magsal Sodium Salicylate  Anturane Depen Capsules Marnal Soma  APF Arthritis pain formula Dewitt's Pills Measurin Stanback  Argesic Dia-Gesic Meclofenamic Sulfinpyrazone  Arthritis Bayer Timed Release Aspirin Diclofenac Meclomen Sulindac  Arthritis pain formula Anacin Dicumarol Medipren Supac  Analgesic (Safety coated) Arthralgen Diffunasal Mefanamic Suprofen  Arthritis Strength Bufferin Dihydrocodeine Mepro Compound Suprol  Arthropan liquid Dopirydamole Methcarbomol with Aspirin Synalgos  ASA tablets/Enseals Disalcid Micrainin Tagament  Ascriptin Doan's Midol Talwin  Ascriptin A/D Dolene Mobidin Tanderil  Ascriptin Extra Strength Dolobid Moblgesic Ticlid  Ascriptin with Codeine Doloprin or Doloprin with Codeine Momentum Tolectin  Asperbuf Duoprin Mono-gesic Trendar  Aspergum Duradyne Motrin or Motrin IB Triminicin  Aspirin  plain, buffered or enteric coated Durasal Myochrisine Trigesic  Aspirin Suppositories Easprin Nalfon Trillsate  Aspirin with Codeine Ecotrin Regular or Extra Strength Naprosyn Uracel  Atromid-S Efficin Naproxen Ursinus  Auranofin Capsules Elmiron Neocylate Vanquish  Axotal Emagrin Norgesic Verin  Azathioprine Empirin or Empirin with Codeine Normiflo Vitamin E  Azolid Emprazil Nuprin Voltaren  Bayer Aspirin plain, buffered or children's or timed BC Tablets or powders Encaprin Orgaran Warfarin Sodium  Buff-a-Comp Enoxaparin Orudis Zorpin  Buff-a-Comp with Codeine Equegesic Os-Cal-Gesic   Buffaprin Excedrin plain, buffered or Extra Strength Oxalid   Bufferin Arthritis Strength Feldene Oxphenbutazone   Bufferin plain or Extra Strength Feldene Capsules Oxycodone with Aspirin   Bufferin with Codeine Fenoprofen Fenoprofen Pabalate or Pabalate-SF   Buffets II Flogesic Panagesic   Buffinol plain or Extra Strength Florinal or Florinal with Codeine Panwarfarin   Buf-Tabs Flurbiprofen Penicillamine   Butalbital Compound Four-way cold tablets Penicillin   Butazolidin Fragmin Pepto-Bismol   Carbenicillin Geminisyn Percodan   Carna Arthritis Reliever Geopen Persantine   Carprofen Gold's salt Persistin   Chloramphenicol Goody's Phenylbutazone   Chloromycetin Haltrain Piroxlcam   Clmetidine heparin Plaquenil   Cllnoril Hyco-pap Ponstel   Clofibrate Hydroxy chloroquine Propoxyphen         Before stopping any of these medications, be sure to consult the physician who ordered them.  Some, such as Coumadin (Warfarin) are ordered to prevent or treat serious conditions such as "deep thrombosis", "pumonary embolisms", and other heart problems.  The amount of time that you may need off of the medication may also vary with the medication and the reason for which you were taking it.  If you are taking any of these medications, please make sure you notify your pain physician before you undergo any  procedures.         Facet Blocks Patient Information  Description: The facets are joints in the spine between the vertebrae.  Like any joints in the body, facets can become irritated and painful.  Arthritis can also effect the facets.  By injecting steroids and local anesthetic in and around these joints, we can temporarily block the nerve supply to them.  Steroids act directly on irritated nerves and tissues to reduce selling and inflammation which often leads to decreased pain.  Facet blocks may be done anywhere along the spine from the neck to the low back depending upon the location of your pain.   After numbing the skin with local anesthetic (like Novocaine), a small needle is passed onto the facet joints under x-ray guidance.    You may experience a sensation of pressure while this is being done.  The entire block usually lasts about 15-25 minutes.   Conditions which may be treated by facet blocks:   Low back/buttock pain  Neck/shoulder pain  Certain types of headaches  Preparation for the injection:  1. Do not eat any solid food or dairy products within 8 hours of your appointment. 2. You may drink clear liquid up to 3 hours before appointment.  Clear liquids include water, black coffee, juice or soda.  No milk or cream please. 3. You may take your regular medication, including pain medications, with a sip of water before your appointment.  Diabetics should hold regular insulin (if taken separately) and take 1/2 normal NPH dose the morning of the procedure.  Carry some sugar containing items with you to your appointment. 4. A driver must accompany you and be prepared to drive you home after your procedure. 5. Bring all your current medications with you. 6. An IV may be inserted and sedation may be given at the discretion of the physician. 7. A blood pressure cuff, EKG and other monitors will often be applied during the procedure.  Some patients may need to have extra oxygen  administered for a short period. 8. You will be asked to provide medical information, including your allergies and medications, prior to the procedure.  We must know immediately if you are taking blood thinners (like Coumadin/Warfarin) or if you are allergic to IV iodine contrast (dye).  We must know if you could possible be pregnant.  Possible side-effects:   Bleeding from needle site  Infection (rare, may require surgery)  Nerve injury (rare)  Numbness & tingling (temporary)  Difficulty urinating (rare, temporary)  Spinal headache (a headache worse with upright posture)  Light-headedness (temporary)  Pain at injection site (serveral days)  Decreased blood pressure (rare, temporary)  Weakness in arm/leg (temporary)  Pressure sensation in back/neck (temporary)   Call if you experience:   Fever/chills associated with headache or increased back/neck pain  Headache worsened by an upright position  New onset, weakness or numbness of an extremity below the injection site  Hives or difficulty breathing (go to the emergency room)  Inflammation or drainage at the injection site(s)  Severe back/neck pain greater than usual  New symptoms which are concerning to you  Please note:  Although the local anesthetic injected can often make your back or neck feel good for several hours after the injection, the pain will likely return. It takes 3-7 days for steroids to work.  You may not notice any pain relief for at least one week.  If effective, we will often do a series of 2-3 injections spaced 3-6 weeks apart to maximally decrease your pain.  After the initial series, you may be a candidate for a more permanent nerve block of the facets.  If you have any questions, please call [phone] [facility] 

## 2016-08-20 NOTE — Progress Notes (Signed)
Subjective:  Patient ID: Audrey Chan, female    DOB: Jun 17, 1950  Age: 66 y.o. MRN: [id number]  CC: Back Pain (lower) and Hip Pain (bilateral)   Service Provided on Last Visit: Procedure  Procedure: None  Previous PROCEDURE:Bilateral lumbar facet medial branch block #2 under fluoroscopic guidance and moderate sedation at L2-3 L3-4 L4-5 and L5-S1 Left side L3-4 L4-5 L5-S1   Previous procedure: Left side L3-4 L4-5 L5-S1 and S1 medial branch block    HPI Audrey Chan presents for reevaluation. She's had 2 previous facet blocks and reports that her low back pain is in good control. She's had some mild recurrence of pain but this is generally well-tolerated with medication management and stretching. Otherwise no new changes in lower extremity strength or function are noted at this time and her bowel and bladder function have been stable. 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 The pain that she currently experiences is generally tolerable.  Previously she has been seen by Dr. Aris Lot and found to be a non-surgical candidate. We have reviewed her MRI again today showing evidence of multilevel facet degeneration. By History she has a 2 year history of low back pain with radiation into the posterior leg and down the leg.   History Audrey Chan has a past medical history of Allergic rhinitis; Cholelithiasis; Diabetes type 2, controlled ([place]); GERD (gastroesophageal reflux disease); Hypertension; Neuropathy; and Renal insufficiency (01/20/2016).   She has a past surgical history that includes Abdominal hysterectomy (1972); Cervical fusion; and Cholecystectomy (N/A, 12/28/2014).   Her family history includes Lung cancer in her father.She reports that she quit smoking about 17 years ago. She has quit using smokeless tobacco. She reports that she does not drink alcohol or use drugs.  No results found for this or any  previous visit.  Tox[employer]ure Select 13  Date Value Ref Range Status  02/29/2016 FINAL  Final    Comment:    ==================================================================== TOXASSURE SELECT 13 (MW) ==================================================================== Test                             Result       Flag       Units   NO DRUGS DETECTED. ==================================================================== Test                      Result    Flag   Units      Ref Range   Creatinine              25               mg/dL      >=20 ==================================================================== Declared Medications:  The flagging and interpretation on this report are based on the  following declared medications.  Unexpected results may arise from  inaccuracies in the declared medications.  **Note: The testing scope of this panel does not include following  reported medications:  Albuterol  Aspirin  Atorvastatin  Diclofenac  Fluticasone  Gabapentin  Hydrochlorothiazide  Insulin (Apidra)  Iron (Ferrous Sulfate)  Lisinopril  Metformin  Multivitamin  Pantoprazole  Ranitidine ==================================================================== For clinical consultation, please call [phone]. ====================================================================     Outpatient Medications Prior to Visit  Medication Sig Dispense Refill  . acetaminophen (TYLENOL) 500 MG tablet Take 500 mg by mouth.    Marland Kitchen albuterol (PROVENTIL HFA;VENTOLIN HFA) 108 (90 BASE) MCG/ACT  inhaler Inhale 2 puffs into the lungs every 6 (six) hours as needed for wheezing or shortness of breath.     Marland Kitchen aspirin EC 81 MG tablet Take 81 mg by mouth daily.    Marland Kitchen atorvastatin (LIPITOR) 40 MG tablet Take 40 mg by mouth daily at 6 PM.     . cetirizine (ZYRTEC) 10 MG tablet Take by mouth daily.     . Cholecalciferol (VITAMIN D3) 1000 units CAPS Take 1,000 Units by mouth daily.     . diclofenac  sodium (VOLTAREN) 1 % GEL Apply 2 g topically 4 (four) times daily. 100 g 0  . ferrous sulfate 325 (65 FE) MG tablet Take 325 mg by mouth daily.    . fluticasone (FLONASE) 50 MCG/ACT nasal spray Place 2 sprays into both nostrils daily as needed for allergies or rhinitis.     Marland Kitchen gabapentin (NEURONTIN) 300 MG capsule Take 900 mg by mouth.    Marland Kitchen glucose blood (BAYER CONTOUR TEST) test strip Use 4 (four) times daily. Contour next strips    . hydrochlorothiazide (HYDRODIURIL) 25 MG tablet Take 25 mg by mouth daily.    . insulin glulisine (APIDRA) 100 UNIT/ML injection Inject into the skin See admin instructions. Continuously via pump    . insulin glulisine (APIDRA) 100 UNIT/ML injection Take up to 50 units daily in insulin pump, as directed    . ipratropium (ATROVENT HFA) 17 MCG/ACT inhaler Inhale into the lungs as needed.     Marland Kitchen lisinopril (PRINIVIL,ZESTRIL) 10 MG tablet Take 10 mg by mouth daily.    . metFORMIN (GLUCOPHAGE-XR) 500 MG 24 hr tablet Take 500 mg by mouth 2 (two) times daily.    . Multiple Vitamin (MULTI-VITAMINS) TABS Take 1 tablet by mouth daily.    . polyethylene glycol powder (GLYCOLAX/MIRALAX) powder 0.5 Containers as needed.     . ranitidine (ZANTAC) 150 MG tablet Take 150 mg by mouth daily.     . rosuvastatin (CRESTOR) 20 MG tablet 20 mg daily.     Marland Kitchen albuterol (PROVENTIL HFA;VENTOLIN HFA) 108 (90 Base) MCG/ACT inhaler Inhale into the lungs.    Marland Kitchen albuterol (VENTOLIN HFA) 108 (90 Base) MCG/ACT inhaler     . aspirin EC 81 MG tablet Take by mouth.    Marland Kitchen aspirin EC 81 MG tablet Take 81 mg by mouth.    Marland Kitchen atorvastatin (LIPITOR) 40 MG tablet Take by mouth.    Marland Kitchen atorvastatin (LIPITOR) 40 MG tablet     . Cholecalciferol (VITAMIN D3) 1000 units CAPS Take by mouth.    . diclofenac sodium (VOLTAREN) 1 % GEL     . fluticasone (FLONASE) 50 MCG/ACT nasal spray     . gabapentin (NEURONTIN) 300 MG capsule Take by mouth.    . gabapentin (NEURONTIN) 800 MG tablet Take 800 mg by mouth 3 (three) times  daily.    . hydrochlorothiazide (HYDRODIURIL) 25 MG tablet Take by mouth.    Marland Kitchen HYDROcodone-acetaminophen (NORCO/VICODIN) 5-325 MG tablet Take by mouth.    Marland Kitchen lisinopril (PRINIVIL,ZESTRIL) 10 MG tablet Take by mouth.    . metFORMIN (GLUCOPHAGE-XR) 500 MG 24 hr tablet Take by mouth.    . Multiple Vitamin (MULTI-VITAMINS) TABS Take by mouth.    . Multiple Vitamin (MULTI-VITAMINS) TABS Take by mouth.    . pantoprazole (PROTONIX) 40 MG tablet Take 40 mg by mouth daily. Reported on 05/21/2015    . ranitidine (ZANTAC) 150 MG tablet Take by mouth.    . traMADol (ULTRAM) 50 MG tablet Take  by mouth.     No facility-administered medications prior to visit.    Lab Results  Component Value Date   WBC 12.8 (H) 01/01/2015   HGB 11.7 (L) 01/01/2015   HCT 36.9 01/01/2015   PLT 400 01/01/2015   GLUCOSE 177 (H) 01/01/2015   ALT 62 (H) 01/01/2015   AST 72 (H) 01/01/2015   NA 136 01/01/2015   K 4.1 01/01/2015   CL 97 (L) 01/01/2015   CREATININE 0.95 01/01/2015   BUN 10 01/01/2015   CO2 31 01/01/2015    --------------------------------------------------------------------------------------------------------------------- Mr Lumbar Spine Wo Contrast  Result Date: 12/27/2015 CLINICAL DATA:  Low back pain extending at both hips and legs. Weakness in both legs. Numbness in both feet. EXAM: MRI LUMBAR SPINE WITHOUT CONTRAST TECHNIQUE: Multiplanar, multisequence MR imaging of the lumbar spine was performed. No intravenous contrast was administered. COMPARISON:  None. FINDINGS: Segmentation: 5 non rib-bearing lumbar type vertebral bodies are present. Alignment: Grade 1 anterolisthesis at L4-5 measures 5 mm. Mild leftward curvature is present. Vertebrae: Minimal endplate marrow changes present on the left at L5-S1. There are at least 4 discrete lesions within the L2 vertebral body. These all demonstrate T1 shortening consistent with a fat component. Other smaller hemangioma is are present throughout the lumbar spine  otherwise. The lesions at L2 demonstrate some signal on the STIR sequence as well. This is not seen in the other lesions. Conus medullaris: Extends to the L1-2 level and appears normal. Paraspinal and other soft tissues: Limited imaging the abdomen is unremarkable. There is no significant adenopathy. Paraspinous musculature is within normal limits. Disc levels: L1-2: Mild facet hypertrophy is present bilaterally. There is a perineural root sleeve cyst on the left. No significant stenosis is present. L2-3: A mild broad-based disc protrusion is present. Mild facet hypertrophy is noted bilaterally. Perineural root sleeve cyst is present on the right. No significant stenosis is evident. L3-4: A broad-based disc protrusion is present. Mild facet hypertrophy is noted. Mild left subarticular and bilateral foraminal narrowing is present. L4-5: This is the most severe stenosis. A broad-based disc protrusion is present. Advanced facet hypertrophy is noted bilaterally. Moderate left and mild right subarticular and foraminal narrowing is present. L5-S1: A broad-based disc protrusion is present. Moderate facet hypertrophy is noted. Mild subarticular narrowing is worse on the right. Moderate foraminal narrowing is present bilaterally. IMPRESSION: 1. 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 These likely represent hemangioma is. Recommend follow-up MRI of the lumbar spine without and with contrast in 3- 6 months to assure stability of these lesions. Neoplasm is considered on likely, but not excluded. 2. Multilevel spondylosis of the lumbar spine is most severe at L4-5. 3. Moderate left and mild right subarticular and foraminal stenosis at L4-5 secondary to a broad-based disc protrusion and advanced facet hypertrophy. 4. 5 mm anterolisthesis at L4-5. Given the degree of facet hypertrophy, this may be dynamic. 5. Mild subarticular and moderate foraminal  narrowing bilaterally at L5-S1 is worse on the right. 6. Mild left and bilateral foraminal stenosis at L3-4. Electronically Signed   By: San Morelle M.D.   On: 12/27/2015 08:28       ---------------------------------------------------------------------------------------------------------------------- Past Medical History:  Diagnosis Date  . Allergic rhinitis   . Cholelithiasis   . Diabetes type 2, controlled ([place])   . GERD (gastroesophageal reflux disease)   . Hypertension   . Neuropathy   . Renal insufficiency 01/20/2016    Past Surgical History:  Procedure Laterality Date  . ABDOMINAL HYSTERECTOMY  1972  . CERVICAL FUSION    . CHOLECYSTECTOMY N/A 12/28/2014   Procedure: LAPAROSCOPIC CHOLECYSTECTOMY;  Surgeon: Sherri Rad, MD;  Location: [facility] ORS;  Service: General;  Laterality: N/A;    Family History  Problem Relation Age of Onset  . Lung cancer Father     Social History  Substance Use Topics  . Smoking status: Former Smoker    Quit date: 02/21/1999  . Smokeless tobacco: Former Systems developer  . Alcohol use No    ---------------------------------------------------------------------------------------------------------------------- Social History   Social History  . Marital status: Widowed    Spouse name: N/A  . Number of children: N/A  . Years of education: N/A   Social History Main Topics  . Smoking status: Former Smoker    Quit date: 02/21/1999  . Smokeless tobacco: Former Systems developer  . Alcohol use No  . Drug use: No  . Sexual activity: Not Asked   Other Topics Concern  . None   Social History Narrative  . None    Scheduled Meds: Continuous Infusions: PRN Meds:.   BP (!) 155/87 (BP Location: Left Arm, Patient Position: Sitting, Cuff Size: Normal)   Pulse (!) 101   Temp 97.8 F (36.6 C) (Oral)   Resp 16   Ht 5\' 7"  (1.702 m)   Wt 148 lb (67.1 kg)   SpO2 99%   BMI 23.18 kg/m    BP Readings from Last 3 Encounters:  08/17/16 (!) 155/87  07/04/16  131/87  05/11/16 (!) 169/72     Wt Readings from Last 3 Encounters:  08/17/16 148 lb (67.1 kg)  07/04/16 148 lb (67.1 kg)  05/11/16 155 lb (70.3 kg)     ----------------------------------------------------------------------------------------------------------------------  ROS Review of Systems  Cardiac: No angina GI: No complaints of constipation Psychologic: History of depression without drug abuse or suicidal ideation Otherwise no changes   Objective:  BP (!) 155/87 (BP Location: Left Arm, Patient Position: Sitting, Cuff Size: Normal)   Pulse (!) 101   Temp 97.8 F (36.6 C) (Oral)   Resp 16   Ht 5\' 7"  (1.702 m)   Wt 148 lb (67.1 kg)   SpO2 99%   BMI 23.18 kg/m   Physical Exam Patient is a pleasant 66 year old black female alert oriented cooperative compliant Pupils equally round reactive to light extraocular muscles intact Heart regular rate and rhythm without murmur Lungs clear to auscultation She has some mild bilateral lumbar paraspinous muscle tenderness but this appears less severe than on previous examination. Her strength appears to be well-preserved to the lower extremities.  Assessment & Plan:   Audrey Chan was seen today for back pain and hip pain.  Diagnoses and all orders for this visit:  DDD (degenerative disc disease), lumbar  Facet arthritis of lumbar region ([place]) -     LUMBAR FACET(MEDIAL BRANCH NERVE BLOCK) MBNB -     LUMBAR FACET(MEDIAL BRANCH NERVE BLOCK) MBNB; Future  Chronic bilateral low back pain with bilateral sciatica -     LUMBAR FACET(MEDIAL BRANCH NERVE BLOCK) MBNB  Other orders -     traMADol (ULTRAM) 50 MG tablet; Take 1 tablet (50 mg total) by mouth 2 (two) times daily.     ----------------------------------------------------------------------------------------------------------------------  Problem List Items Addressed This Visit    None    Visit Diagnoses    DDD (degenerative disc disease), lumbar    -  Primary    Relevant Medications   traMADol (ULTRAM) 50 MG tablet   Facet arthritis of  lumbar region [facility])       Relevant Medications   traMADol (ULTRAM) 50 MG tablet   Other Relevant Orders   LUMBAR FACET(MEDIAL BRANCH NERVE BLOCK) MBNB   Chronic bilateral low back pain with bilateral sciatica       Relevant Medications   traMADol (ULTRAM) 50 MG tablet      ----------------------------------------------------------------------------------------------------------------------  1. DDD (degenerative disc disease), lumbar Continue physical therapy stretching strengthening exercises  2. Facet arthritis of lumbar region [facility]) We will defer on any repeat injections today with return to clinic in approximately 2 months for reevaluation and possibly a bilateral lumbar facet she has responded well to these. In the meantime I'm going to give her prescription for Ultram to be taken twice a day when necessary for back pain and she is to continue with core strengthening  3. Sciatica of left side associated with disorder of lumbosacral spine  - Ambulatory referral to Physical Therapy previously requested  4. Sciatica of right side associated with disorder of lumbar spine   5. Chronic bilateral low back pain with right-sided sciatica     ----------------------------------------------------------------------------------------------------------------------  I have discontinued Ms. Brunetto's pantoprazole and HYDROcodone-acetaminophen. I have also changed her traMADol. Additionally, I am having her maintain her albuterol, fluticasone, lisinopril, hydrochlorothiazide, insulin glulisine, MULTI-VITAMINS, ferrous sulfate, metFORMIN, aspirin EC, atorvastatin, ranitidine, diclofenac sodium, glucose blood, cetirizine, Vitamin D3, polyethylene glycol powder, insulin glulisine, acetaminophen, rosuvastatin, ipratropium, and gabapentin.   Meds ordered this encounter  Medications  . traMADol (ULTRAM) 50 MG tablet     Sig: Take 1 tablet (50 mg total) by mouth 2 (two) times daily.    Dispense:  60 tablet    Refill:  1     Follow-up: Return in about 2 months (around 10/17/2016) for evaluation, procedure.    Molli Barrows, MD   The [place] practitioner database for opioid medications on this patient has been reviewed by me and my staff   Greater than 50% of the total encounter time was spent in counseling and / or coordination of care.     This dictation was performed utilizing Systems analyst.  Please excuse any unintentional or mistaken typographical errors as a result.

## 2016-10-16 ENCOUNTER — Encounter: Payer: Self-pay | Admitting: Anesthesiology

## 2016-10-16 ENCOUNTER — Ambulatory Visit: Payer: Medicare PPO | Attending: Anesthesiology | Admitting: Anesthesiology

## 2016-10-16 VITALS — BP 119/76 | HR 101 | Temp 98.1°F | Resp 16 | Ht 67.0 in | Wt 147.0 lb

## 2016-10-16 DIAGNOSIS — M47816 Spondylosis without myelopathy or radiculopathy, lumbar region: Secondary | ICD-10-CM | POA: Diagnosis not present

## 2016-10-16 DIAGNOSIS — Z801 Family history of malignant neoplasm of trachea, bronchus and lung: Secondary | ICD-10-CM | POA: Diagnosis not present

## 2016-10-16 DIAGNOSIS — M5441 Lumbago with sciatica, right side: Secondary | ICD-10-CM | POA: Insufficient documentation

## 2016-10-16 DIAGNOSIS — Z794 Long term (current) use of insulin: Secondary | ICD-10-CM | POA: Diagnosis not present

## 2016-10-16 DIAGNOSIS — I10 Essential (primary) hypertension: Secondary | ICD-10-CM | POA: Diagnosis not present

## 2016-10-16 DIAGNOSIS — Z90710 Acquired absence of both cervix and uterus: Secondary | ICD-10-CM | POA: Diagnosis not present

## 2016-10-16 DIAGNOSIS — Z9049 Acquired absence of other specified parts of digestive tract: Secondary | ICD-10-CM | POA: Insufficient documentation

## 2016-10-16 DIAGNOSIS — Z79899 Other long term (current) drug therapy: Secondary | ICD-10-CM | POA: Insufficient documentation

## 2016-10-16 DIAGNOSIS — K219 Gastro-esophageal reflux disease without esophagitis: Secondary | ICD-10-CM | POA: Insufficient documentation

## 2016-10-16 DIAGNOSIS — G8929 Other chronic pain: Secondary | ICD-10-CM | POA: Diagnosis not present

## 2016-10-16 DIAGNOSIS — Z981 Arthrodesis status: Secondary | ICD-10-CM | POA: Insufficient documentation

## 2016-10-16 DIAGNOSIS — M4696 Unspecified inflammatory spondylopathy, lumbar region: Secondary | ICD-10-CM | POA: Diagnosis not present

## 2016-10-16 DIAGNOSIS — M5442 Lumbago with sciatica, left side: Secondary | ICD-10-CM

## 2016-10-16 DIAGNOSIS — Z87891 Personal history of nicotine dependence: Secondary | ICD-10-CM | POA: Insufficient documentation

## 2016-10-16 DIAGNOSIS — N289 Disorder of kidney and ureter, unspecified: Secondary | ICD-10-CM | POA: Diagnosis not present

## 2016-10-16 DIAGNOSIS — Z7982 Long term (current) use of aspirin: Secondary | ICD-10-CM | POA: Diagnosis not present

## 2016-10-16 DIAGNOSIS — M545 Low back pain: Secondary | ICD-10-CM | POA: Diagnosis present

## 2016-10-16 DIAGNOSIS — Z9641 Presence of insulin pump (external) (internal): Secondary | ICD-10-CM | POA: Diagnosis not present

## 2016-10-16 DIAGNOSIS — E1140 Type 2 diabetes mellitus with diabetic neuropathy, unspecified: Secondary | ICD-10-CM | POA: Insufficient documentation

## 2016-10-16 MED ORDER — TRAMADOL HCL 50 MG PO TABS: 50.0000 mg | ORAL_TABLET | Freq: Three times a day (TID) | ORAL | 1 refills | Status: DC

## 2016-10-16 NOTE — Progress Notes (Signed)
Subjective:  Patient ID: Audrey Chan, female    DOB: 12/23/1950  Age: 66 y.o. MRN: [id number]  CC: Back Pain (lower bilateral)   Service Provided on Last Visit: Evaluation  Procedure: None  Previous PROCEDURE:Bilateral lumbar facet medial branch block #2 under fluoroscopic guidance and moderate sedation at L2-3 L3-4 L4-5 and L5-S1 Left side L3-4 L4-5 L5-S1   Previous procedure: Left side L3-4 L4-5 L5-S1 and S1 medial branch block    HPI Audrey Chan presents for reevaluation. She was last seen June 28. In the past she's had a bilateral lumbar facet block 2 and has experienced improved low back pain. She has been taking Ultram 50 mg tablets twice a day and has had some increasing back pain as of late with associated left anterior and lateral leg pain and some left leg cramping. She's had this in the past and received epidural steroids elsewhere with good relief and improved lower extremity pain relief. She is denying any bowel or bladder dysfunction at this time and her leg strength has been good. The Ultram does not seem to cause any side effects and she occasionally takes ibuprofen 800 mg tablets but has not been taking these recently. The pain is described as a gnawing aching pain in the left lower back more so than the right with radiation to the left lateral and anterior thigh and calf.  Previously she has been seen by Dr. Aris Lot and found to be a non-surgical candidate.   History Audrey Chan has a past medical history of Allergic rhinitis; Cholelithiasis; Diabetes type 2, controlled ([place]); GERD (gastroesophageal reflux disease); Hypertension; Neuropathy; and Renal insufficiency (01/20/2016).   She has a past surgical history that includes Abdominal hysterectomy (1972); Cervical fusion; and Cholecystectomy (N/A, 12/28/2014).   Her family history includes Lung cancer in her father.She reports that she quit smoking about 17 years ago. She has quit using smokeless tobacco. She  reports that she does not drink alcohol or use drugs.  No results found for this or any previous visit.  ToxAssure Select 13  Date Value Ref Range Status  02/29/2016 FINAL  Final    Comment:    ==================================================================== TOXASSURE SELECT 13 (MW) ==================================================================== Test                             Result       Flag       Units   NO DRUGS DETECTED. ==================================================================== Test                      Result    Flag   Units      Ref Range   Creatinine              25               mg/dL      >=20 ==================================================================== Declared Medications:  The flagging and interpretation on this report are based on the  following declared medications.  Unexpected results may arise from  inaccuracies in the declared medications.  **Note: The testing scope of this panel does not include following  reported medications:  Albuterol  Aspirin  Atorvastatin  Diclofenac  Fluticasone  Gabapentin  Hydrochlorothiazide  Insulin (Apidra)  Iron (Ferrous Sulfate)  Lisinopril  Metformin  Multivitamin  Pantoprazole  Ranitidine ==================================================================== For clinical consultation, please call [phone]. ====================================================================     Outpatient Medications Prior to Visit  Medication Sig  Dispense Refill  . acetaminophen (TYLENOL) 500 MG tablet Take 500 mg by mouth.    Marland Kitchen aspirin EC 81 MG tablet Take 81 mg by mouth daily.    Marland Kitchen atorvastatin (LIPITOR) 40 MG tablet Take 40 mg by mouth daily at 6 PM.     . cetirizine (ZYRTEC) 10 MG tablet Take by mouth daily.     . Cholecalciferol (VITAMIN D3) 1000 units CAPS Take 1,000 Units by mouth daily.     . diclofenac sodium (VOLTAREN) 1 % GEL Apply 2 g topically 4 (four) times daily. 100 g 0  . ferrous  sulfate 325 (65 FE) MG tablet Take 325 mg by mouth daily.    . fluticasone (FLONASE) 50 MCG/ACT nasal spray Place 2 sprays into both nostrils daily as needed for allergies or rhinitis.     Marland Kitchen gabapentin (NEURONTIN) 300 MG capsule Take 300 mg by mouth 3 (three) times daily.     Marland Kitchen glucose blood (BAYER CONTOUR TEST) test strip Use 4 (four) times daily. Contour next strips    . hydrochlorothiazide (HYDRODIURIL) 25 MG tablet Take 25 mg by mouth daily.    . insulin glulisine (APIDRA) 100 UNIT/ML injection Inject into the skin See admin instructions. Continuously via pump    . insulin glulisine (APIDRA) 100 UNIT/ML injection Take up to 50 units daily in insulin pump, as directed    . lisinopril (PRINIVIL,ZESTRIL) 10 MG tablet Take 10 mg by mouth daily.    . metFORMIN (GLUCOPHAGE-XR) 500 MG 24 hr tablet Take 500 mg by mouth 2 (two) times daily.    . Multiple Vitamin (MULTI-VITAMINS) TABS Take 1 tablet by mouth daily.    . ranitidine (ZANTAC) 150 MG tablet Take 150 mg by mouth daily.     . rosuvastatin (CRESTOR) 20 MG tablet 20 mg daily.     . traMADol (ULTRAM) 50 MG tablet Take 1 tablet (50 mg total) by mouth 2 (two) times daily. 60 tablet 1  . albuterol (PROVENTIL HFA;VENTOLIN HFA) 108 (90 BASE) MCG/ACT inhaler Inhale 2 puffs into the lungs every 6 (six) hours as needed for wheezing or shortness of breath.     Marland Kitchen ipratropium (ATROVENT HFA) 17 MCG/ACT inhaler Inhale into the lungs as needed.     . polyethylene glycol powder (GLYCOLAX/MIRALAX) powder 0.5 Containers as needed.      No facility-administered medications prior to visit.    Lab Results  Component Value Date   WBC 12.8 (H) 01/01/2015   HGB 11.7 (L) 01/01/2015   HCT 36.9 01/01/2015   PLT 400 01/01/2015   GLUCOSE 177 (H) 01/01/2015   ALT 62 (H) 01/01/2015   AST 72 (H) 01/01/2015   NA 136 01/01/2015   K 4.1 01/01/2015   CL 97 (L) 01/01/2015   CREATININE 0.95 01/01/2015   BUN 10 01/01/2015   CO2 31 01/01/2015     --------------------------------------------------------------------------------------------------------------------- Mr Lumbar Spine Wo Contrast  Result Date: 12/27/2015 CLINICAL DATA:  Low back pain extending at both hips and legs. Weakness in both legs. Numbness in both feet. EXAM: MRI LUMBAR SPINE WITHOUT CONTRAST TECHNIQUE: Multiplanar, multisequence MR imaging of the lumbar spine was performed. No intravenous contrast was administered. COMPARISON:  None. FINDINGS: Segmentation: 5 non rib-bearing lumbar type vertebral bodies are present. Alignment: Grade 1 anterolisthesis at L4-5 measures 5 mm. Mild leftward curvature is present. Vertebrae: Minimal endplate marrow changes present on the left at L5-S1. There are at least 4 discrete lesions within the L2 vertebral body. These all demonstrate T1 shortening consistent  with a fat component. Other smaller hemangioma is are present throughout the lumbar spine otherwise. The lesions at L2 demonstrate some signal on the STIR sequence as well. This is not seen in the other lesions. Conus medullaris: Extends to the L1-2 level and appears normal. Paraspinal and other soft tissues: Limited imaging the abdomen is unremarkable. There is no significant adenopathy. Paraspinous musculature is within normal limits. Disc levels: L1-2: Mild facet hypertrophy is present bilaterally. There is a perineural root sleeve cyst on the left. No significant stenosis is present. L2-3: A mild broad-based disc protrusion is present. Mild facet hypertrophy is noted bilaterally. Perineural root sleeve cyst is present on the right. No significant stenosis is evident. L3-4: A broad-based disc protrusion is present. Mild facet hypertrophy is noted. Mild left subarticular and bilateral foraminal narrowing is present. L4-5: This is the most severe stenosis. A broad-based disc protrusion is present. Advanced facet hypertrophy is noted bilaterally. Moderate left and mild right subarticular and  foraminal narrowing is present. L5-S1: A broad-based disc protrusion is present. Moderate facet hypertrophy is noted. Mild subarticular narrowing is worse on the right. Moderate foraminal narrowing is present bilaterally. IMPRESSION: 1. 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 These likely represent hemangioma is. Recommend follow-up MRI of the lumbar spine without and with contrast in 3- 6 months to assure stability of these lesions. Neoplasm is considered on likely, but not excluded. 2. Multilevel spondylosis of the lumbar spine is most severe at L4-5. 3. Moderate left and mild right subarticular and foraminal stenosis at L4-5 secondary to a broad-based disc protrusion and advanced facet hypertrophy. 4. 5 mm anterolisthesis at L4-5. Given the degree of facet hypertrophy, this may be dynamic. 5. Mild subarticular and moderate foraminal narrowing bilaterally at L5-S1 is worse on the right. 6. Mild left and bilateral foraminal stenosis at L3-4. Electronically Signed   By: San Morelle M.D.   On: 12/27/2015 08:28       ---------------------------------------------------------------------------------------------------------------------- Past Medical History:  Diagnosis Date  . Allergic rhinitis   . Cholelithiasis   . Diabetes type 2, controlled ([place])   . GERD (gastroesophageal reflux disease)   . Hypertension   . Neuropathy   . Renal insufficiency 01/20/2016    Past Surgical History:  Procedure Laterality Date  . ABDOMINAL HYSTERECTOMY  1972  . CERVICAL FUSION    . CHOLECYSTECTOMY N/A 12/28/2014   Procedure: LAPAROSCOPIC CHOLECYSTECTOMY;  Surgeon: Sherri Rad, MD;  Location: [facility] ORS;  Service: General;  Laterality: N/A;    Family History  Problem Relation Age of Onset  . Lung cancer Father     Social History  Substance Use Topics  . Smoking status: Former Smoker    Quit date: 02/21/1999  . Smokeless  tobacco: Former Systems developer  . Alcohol use No    ---------------------------------------------------------------------------------------------------------------------- Social History   Social History  . Marital status: Widowed    Spouse name: N/A  . Number of children: N/A  . Years of education: N/A   Social History Main Topics  . Smoking status: Former Smoker    Quit date: 02/21/1999  . Smokeless tobacco: Former Systems developer  . Alcohol use No  . Drug use: No  . Sexual activity: Not Asked   Other Topics Concern  . None   Social History Narrative  . None   BP 119/76 (BP Location: Right Arm, Patient Position: Sitting, Cuff Size: Normal)   Pulse (!) 101   Temp 98.1 F (36.7  C) (Oral)   Resp 16   Ht 5\' 7"  (1.702 m)   Wt 147 lb (66.7 kg)   SpO2 98%   BMI 23.02 kg/m    BP Readings from Last 3 Encounters:  10/16/16 119/76  08/17/16 (!) 155/87  07/04/16 131/87     Wt Readings from Last 3 Encounters:  10/16/16 147 lb (66.7 kg)  08/17/16 148 lb (67.1 kg)  07/04/16 148 lb (67.1 kg)     ----------------------------------------------------------------------------------------------------------------------  ROS Review of Systems  Cardiac: No angina or chest pain or discomfort GI: No constipation CNS: No sedation  Objective:  BP 119/76 (BP Location: Right Arm, Patient Position: Sitting, Cuff Size: Normal)   Pulse (!) 101   Temp 98.1 F (36.7 C) (Oral)   Resp 16   Ht 5\' 7"  (1.702 m)   Wt 147 lb (66.7 kg)   SpO2 98%   BMI 23.02 kg/m   Physical Exam Patient is a pleasant 67 year old black female alert oriented cooperative compliant Pupils equally round reactive to light extraocular muscles intact Heart regular rate and rhythm without murmur Lungs clear to auscultation She has some mild paraspinous muscle tenderness but no overt trigger points and a positive straight leg raise on the left side.  Assessment & Plan:   Zyonna was seen today for back pain.  Diagnoses and  all orders for this visit:  Chronic bilateral low back pain with bilateral sciatica -     Lumbar Epidural Injection; Future  Facet arthritis of lumbar region [facility])  Other orders -     traMADol (ULTRAM) 50 MG tablet; Take 1 tablet (50 mg total) by mouth 3 (three) times daily.     ----------------------------------------------------------------------------------------------------------------------  Problem List Items Addressed This Visit    None    Visit Diagnoses    Chronic bilateral low back pain with bilateral sciatica    -  Primary   Relevant Medications   traMADol (ULTRAM) 50 MG tablet   Other Relevant Orders   Lumbar Epidural Injection   Facet arthritis of lumbar region ([place])       Relevant Medications   traMADol (ULTRAM) 50 MG tablet      ----------------------------------------------------------------------------------------------------------------------  1. DDD (degenerative disc disease), lumbar Continue physical therapy stretching strengthening exercises And return to clinic in 1 month for an L5 S1 lumbar epidural steroid No. 1. The risks and benefits of the procedure were reviewed in detail and all questions answered.  2. Facet arthritis of lumbar region [facility]) Continue with back stretching strengthening exercises once again and we'll increase her Ultram to 3 times a day. She can reinstitute ibuprofen 800 mg tablets up to twice a day 5 days of the week. Continue Neurontin for baseline at 3 times a day dosing.  3. Sciatica of left side associated with disorder of lumbosacral spine  4. Sciatica of right side associated with disorder of lumbar spine        ----------------------------------------------------------------------------------------------------------------------  I have changed Audrey Chan's traMADol. I am also having her maintain her albuterol, fluticasone, lisinopril, hydrochlorothiazide, insulin glulisine, MULTI-VITAMINS, ferrous sulfate,  metFORMIN, aspirin EC, atorvastatin, ranitidine, diclofenac sodium, glucose blood, cetirizine, Vitamin D3, polyethylene glycol powder, insulin glulisine, acetaminophen, rosuvastatin, ipratropium, and gabapentin.   Meds ordered this encounter  Medications  . traMADol (ULTRAM) 50 MG tablet    Sig: Take 1 tablet (50 mg total) by mouth 3 (three) times daily.    Dispense:  90 tablet    Refill:  1     Follow-up: Return in about  1 month (around 11/16/2016) for procedure, evaluation.    Molli Barrows, MD   The [place] practitioner database for opioid medications on this patient has been reviewed by me and my staff   Greater than 50% of the total encounter time was spent in counseling and / or coordination of care.     This dictation was performed utilizing Systems analyst.  Please excuse any unintentional or mistaken typographical errors as a result.

## 2016-10-16 NOTE — Progress Notes (Signed)
Safety precautions to be maintained throughout the outpatient stay will include: orient to surroundings, keep bed in low position, maintain call bell within reach at all times, provide assistance with transfer out of bed and ambulation.  

## 2016-10-16 NOTE — Patient Instructions (Signed)
Epidural Steroid Injection Patient Information  Description: The epidural space surrounds the nerves as they exit the spinal cord.  In some patients, the nerves can be compressed and inflamed by a bulging disc or a tight spinal canal (spinal stenosis).  By injecting steroids into the epidural space, we can bring irritated nerves into direct contact with a potentially helpful medication.  These steroids act directly on the irritated nerves and can reduce swelling and inflammation which often leads to decreased pain.  Epidural steroids may be injected anywhere along the spine and from the neck to the low back depending upon the location of your pain.   After numbing the skin with local anesthetic (like Novocaine), a small needle is passed into the epidural space slowly.  You may experience a sensation of pressure while this is being done.  The entire block usually last less than 10 minutes.  Conditions which may be treated by epidural steroids:   Low back and leg pain  Neck and arm pain  Spinal stenosis  Post-laminectomy syndrome  Herpes zoster (shingles) pain  Pain from compression fractures  Preparation for the injection:  1. Do not eat any solid food or dairy products within 8 hours of your appointment.  2. You may drink clear liquids up to 3 hours before appointment.  Clear liquids include water, black coffee, juice or soda.  No milk or cream please. 3. You may take your regular medication, including pain medications, with a sip of water before your appointment  Diabetics should hold regular insulin (if taken separately) and take 1/2 normal NPH dos the morning of the procedure.  Carry some sugar containing items with you to your appointment. 4. A driver must accompany you and be prepared to drive you home after your procedure.  5. Bring all your current medications with your. 6. An IV may be inserted and sedation may be given at the discretion of the physician.   7. A blood pressure  cuff, EKG and other monitors will often be applied during the procedure.  Some patients may need to have extra oxygen administered for a short period. 8. You will be asked to provide medical information, including your allergies, prior to the procedure.  We must know immediately if you are taking blood thinners (like Coumadin/Warfarin)  Or if you are allergic to IV iodine contrast (dye). We must know if you could possible be pregnant.  Possible side-effects:  Bleeding from needle site  Infection (rare, may require surgery)  Nerve injury (rare)  Numbness & tingling (temporary)  Difficulty urinating (rare, temporary)  Spinal headache ( a headache worse with upright posture)  Light -headedness (temporary)  Pain at injection site (several days)  Decreased blood pressure (temporary)  Weakness in arm/leg (temporary)  Pressure sensation in back/neck (temporary)  Call if you experience:  Fever/chills associated with headache or increased back/neck pain.  Headache worsened by an upright position.  New onset weakness or numbness of an extremity below the injection site  Hives or difficulty breathing (go to the emergency room)  Inflammation or drainage at the infection site  Severe back/neck pain  Any new symptoms which are concerning to you  Please note:  Although the local anesthetic injected can often make your back or neck feel good for several hours after the injection, the pain will likely return.  It takes 3-7 days for steroids to work in the epidural space.  You may not notice any pain relief for at least that one week.    If effective, we will often do a series of three injections spaced 3-6 weeks apart to maximally decrease your pain.  After the initial series, we generally will wait several months before considering a repeat injection of the same type.  If you have any questions, please call [phone] [facility] Clinic[facility] R[facility] AND  COMPLICATIONS  What are the risk, side effects and possible complications? Generally speaking, most procedures are safe.  However, with any procedure there are risks, side effects, and the possibility of complications.  The risks and complications are dependent upon the sites that are lesioned, or the type of nerve block to be performed.  The closer the procedure is to the spine, the more serious the risks are.  Great care is taken when placing the radio frequency needles, block needles or lesioning probes, but sometimes complications can occur. 1. Infection: Any time there is an injection through the skin, there is a risk of infection.  This is why sterile conditions are used for these blocks.  There are four possible types of infection. 1. Localized skin infection. 2. Central Nervous System Infection-This can be in the form of Meningitis, which can be deadly. 3. Epidural Infections-This can be in the form of an epidural abscess, which can cause pressure inside of the spine, causing compression of the spinal cord with subsequent paralysis. This would require an emergency surgery to decompress, and there are no guarantees that the patient would recover from the paralysis. 4. Discitis-This is an infection of the intervertebral discs.  It occurs in about 1% of discography procedures.  It is difficult to treat and it may lead to surgery.        2. Pain: the needles have to go through skin and soft tissues, will cause soreness.       3. Damage to internal structures:  The nerves to be lesioned may be near blood vessels or    other nerves which can be potentially damaged.       4. Bleeding: Bleeding is more common if the patient is taking blood thinners such as  aspirin, Coumadin, Ticiid, Plavix, etc., or if he/she have some genetic predisposition  such as hemophilia. Bleeding into the spinal canal can cause compression of the spinal  cord with subsequent paralysis.  This would require an emergency surgery  to  decompress and there are no guarantees that the patient would recover from the  paralysis.       5. Pneumothorax:  Puncturing of a lung is a possibility, every time a needle is introduced in  the area of the chest or upper back.  Pneumothorax refers to free air around the  collapsed lung(s), inside of the thoracic cavity (chest cavity).  Another two possible  complications related to a similar event would include: Hemothorax and Chylothorax.   These are variations of the Pneumothorax, where instead of air around the collapsed  lung(s), you may have blood or chyle, respectively.       6. Spinal headaches: They may occur with any procedures in the area of the spine.       7. Persistent CSF (Cerebro-Spinal Fluid) leakage: This is a rare problem, but may occur  with prolonged intrathecal or epidural catheters either due to the formation of a fistulous  track or a dural tear.       8. Nerve damage: By working so close to the spinal cord, there is always a possibility of  nerve damage, which could be   as serious as a permanent spinal cord injury with  paralysis.       9. Death:  Although rare, severe deadly allergic reactions known as "Anaphylactic  reaction" can occur to any of the medications used.      10. Worsening of the symptoms:  We can always make thing worse.  What are the chances of something like this happening? Chances of any of this occuring are extremely low.  By statistics, you have more of a chance of getting killed in a motor vehicle accident: while driving to the hospital than any of the above occurring .  Nevertheless, you should be aware that they are possibilities.  In general, it is similar to taking a shower.  Everybody knows that you can slip, hit your head and get killed.  Does that mean that you should not shower again?  Nevertheless always keep in mind that statistics do not mean anything if you happen to be on the wrong side of them.  Even if a procedure has a 1 (one) in a 1,000,000  (million) chance of going wrong, it you happen to be that one..Also, keep in mind that by statistics, you have more of a chance of having something go wrong when taking medications.  Who should not have this procedure? If you are on a blood thinning medication (e.g. Coumadin, Plavix, see list of "Blood Thinners"), or if you have an active infection going on, you should not have the procedure.  If you are taking any blood thinners, please inform your physician.  How should I prepare for this procedure?  Do not eat or drink anything at least six hours prior to the procedure.  Bring a driver with you .  It cannot be a taxi.  Come accompanied by an adult that can drive you back, and that is strong enough to help you if your legs get weak or numb from the local anesthetic.  Take all of your medicines the morning of the procedure with just enough water to swallow them.  If you have diabetes, make sure that you are scheduled to have your procedure done first thing in the morning, whenever possible.  If you have diabetes, take only half of your insulin dose and notify our nurse that you have done so as soon as you arrive at the clinic.  If you are diabetic, but only take blood sugar pills (oral hypoglycemic), then do not take them on the morning of your procedure.  You may take them after you have had the procedure.  Do not take aspirin or any aspirin-containing medications, at least eleven (11) days prior to the procedure.  They may prolong bleeding.  Wear loose fitting clothing that may be easy to take off and that you would not mind if it got stained with Betadine or blood.  Do not wear any jewelry or perfume  Remove any nail coloring.  It will interfere with some of our monitoring equipment.  NOTE: Remember that this is not meant to be interpreted as a complete list of all possible complications.  Unforeseen problems may occur.  BLOOD THINNERS The following drugs contain aspirin or other  products, which can cause increased bleeding during surgery and should not be taken for 2 weeks prior to and 1 week after surgery.  If you should need take something for relief of minor pain, you may take acetaminophen which is found in Tylenol,m Datril, Anacin-3 and Panadol. It is not blood thinner. The products listed below   are.  Do not take any of the products listed below in addition to any listed on your instruction sheet.  A.P.C or A.P.C with Codeine Codeine Phosphate Capsules #3 Ibuprofen Ridaura  ABC compound Congesprin Imuran rimadil  Advil Cope Indocin Robaxisal  Alka-Seltzer Effervescent Pain Reliever and Antacid Coricidin or Coricidin-D  Indomethacin Rufen  Alka-Seltzer plus Cold Medicine Cosprin Ketoprofen S-A-C Tablets  Anacin Analgesic Tablets or Capsules Coumadin Korlgesic Salflex  Anacin Extra Strength Analgesic tablets or capsules CP-2 Tablets Lanoril Salicylate  Anaprox Cuprimine Capsules Levenox Salocol  Anexsia-D Dalteparin Magan Salsalate  Anodynos Darvon compound Magnesium Salicylate Sine-off  Ansaid Dasin Capsules Magsal Sodium Salicylate  Anturane Depen Capsules Marnal Soma  APF Arthritis pain formula Dewitt's Pills Measurin Stanback  Argesic Dia-Gesic Meclofenamic Sulfinpyrazone  Arthritis Bayer Timed Release Aspirin Diclofenac Meclomen Sulindac  Arthritis pain formula Anacin Dicumarol Medipren Supac  Analgesic (Safety coated) Arthralgen Diffunasal Mefanamic Suprofen  Arthritis Strength Bufferin Dihydrocodeine Mepro Compound Suprol  Arthropan liquid Dopirydamole Methcarbomol with Aspirin Synalgos  ASA tablets/Enseals Disalcid Micrainin Tagament  Ascriptin Doan's Midol Talwin  Ascriptin A/D Dolene Mobidin Tanderil  Ascriptin Extra Strength Dolobid Moblgesic Ticlid  Ascriptin with Codeine Doloprin or Doloprin with Codeine Momentum Tolectin  Asperbuf Duoprin Mono-gesic Trendar  Aspergum Duradyne Motrin or Motrin IB Triminicin  Aspirin plain, buffered or enteric  coated Durasal Myochrisine Trigesic  Aspirin Suppositories Easprin Nalfon Trillsate  Aspirin with Codeine Ecotrin Regular or Extra Strength Naprosyn Uracel  Atromid-S Efficin Naproxen Ursinus  Auranofin Capsules Elmiron Neocylate Vanquish  Axotal Emagrin Norgesic Verin  Azathioprine Empirin or Empirin with Codeine Normiflo Vitamin E  Azolid Emprazil Nuprin Voltaren  Bayer Aspirin plain, buffered or children's or timed BC Tablets or powders Encaprin Orgaran Warfarin Sodium  Buff-a-Comp Enoxaparin Orudis Zorpin  Buff-a-Comp with Codeine Equegesic Os-Cal-Gesic   Buffaprin Excedrin plain, buffered or Extra Strength Oxalid   Bufferin Arthritis Strength Feldene Oxphenbutazone   Bufferin plain or Extra Strength Feldene Capsules Oxycodone with Aspirin   Bufferin with Codeine Fenoprofen Fenoprofen Pabalate or Pabalate-SF   Buffets II Flogesic Panagesic   Buffinol plain or Extra Strength Florinal or Florinal with Codeine Panwarfarin   Buf-Tabs Flurbiprofen Penicillamine   Butalbital Compound Four-way cold tablets Penicillin   Butazolidin Fragmin Pepto-Bismol   Carbenicillin Geminisyn Percodan   Carna Arthritis Reliever Geopen Persantine   Carprofen Gold's salt Persistin   Chloramphenicol Goody's Phenylbutazone   Chloromycetin Haltrain Piroxlcam   Clmetidine heparin Plaquenil   Cllnoril Hyco-pap Ponstel   Clofibrate Hydroxy chloroquine Propoxyphen         Before stopping any of these medications, be sure to consult the physician who ordered them.  Some, such as Coumadin (Warfarin) are ordered to prevent or treat serious conditions such as "deep thrombosis", "pumonary embolisms", and other heart problems.  The amount of time that you may need off of the medication may also vary with the medication and the reason for which you were taking it.  If you are taking any of these medications, please make sure you notify your pain physician before you undergo any procedures.          

## 2016-11-12 ENCOUNTER — Emergency Department
Admission: EM | Admit: 2016-11-12 | Discharge: 2016-11-12 | Disposition: A | Discharge status: Home / Self Care | Payer: Medicare HMO | Attending: Emergency Medicine | Admitting: Emergency Medicine

## 2016-11-12 ENCOUNTER — Encounter: Payer: Self-pay | Admitting: Emergency Medicine

## 2016-11-12 ENCOUNTER — Emergency Department: Payer: Medicare HMO

## 2016-11-12 DIAGNOSIS — J189 Pneumonia, unspecified organism: Secondary | ICD-10-CM | POA: Diagnosis not present

## 2016-11-12 DIAGNOSIS — R079 Chest pain, unspecified: Secondary | ICD-10-CM | POA: Diagnosis not present

## 2016-11-12 DIAGNOSIS — R0982 Postnasal drip: Secondary | ICD-10-CM | POA: Diagnosis not present

## 2016-11-12 DIAGNOSIS — Z87891 Personal history of nicotine dependence: Secondary | ICD-10-CM | POA: Diagnosis not present

## 2016-11-12 DIAGNOSIS — Z79899 Other long term (current) drug therapy: Secondary | ICD-10-CM | POA: Insufficient documentation

## 2016-11-12 DIAGNOSIS — R05 Cough: Secondary | ICD-10-CM | POA: Diagnosis not present

## 2016-11-12 DIAGNOSIS — J029 Acute pharyngitis, unspecified: Secondary | ICD-10-CM | POA: Diagnosis not present

## 2016-11-12 DIAGNOSIS — R0602 Shortness of breath: Secondary | ICD-10-CM | POA: Diagnosis present

## 2016-11-12 DIAGNOSIS — Z7982 Long term (current) use of aspirin: Secondary | ICD-10-CM | POA: Insufficient documentation

## 2016-11-12 DIAGNOSIS — Z794 Long term (current) use of insulin: Secondary | ICD-10-CM | POA: Insufficient documentation

## 2016-11-12 DIAGNOSIS — I10 Essential (primary) hypertension: Secondary | ICD-10-CM | POA: Insufficient documentation

## 2016-11-12 DIAGNOSIS — E119 Type 2 diabetes mellitus without complications: Secondary | ICD-10-CM | POA: Diagnosis not present

## 2016-11-12 LAB — URINALYSIS, COMPLETE (UACMP) WITH MICROSCOPIC
Bacteria, UA: NONE SEEN
Bilirubin Urine: NEGATIVE
GLUCOSE, UA: 150 mg/dL — AB
Hgb urine dipstick: NEGATIVE
Ketones, ur: NEGATIVE mg/dL
Nitrite: NEGATIVE
PH: 7 (ref 5.0–8.0)
Protein, ur: NEGATIVE mg/dL
RBC / HPF: NONE SEEN RBC/hpf (ref 0–5)
Specific Gravity, Urine: 1.005 (ref 1.005–1.030)

## 2016-11-12 LAB — COMPREHENSIVE METABOLIC PANEL
ALBUMIN: 3.5 g/dL (ref 3.5–5.0)
ALT: 44 U/L (ref 14–54)
ANION GAP: 9 (ref 5–15)
AST: 44 U/L — ABNORMAL HIGH (ref 15–41)
Alkaline Phosphatase: 94 U/L (ref 38–126)
BILIRUBIN TOTAL: 0.1 mg/dL — AB (ref 0.3–1.2)
BUN: 8 mg/dL (ref 6–20)
CALCIUM: 8.9 mg/dL (ref 8.9–10.3)
CO2: 29 mmol/L (ref 22–32)
CREATININE: 0.95 mg/dL (ref 0.44–1.00)
Chloride: 101 mmol/L (ref 101–111)
GFR calc Af Amer: >60 mL/min (ref >60)
GFR calc non Af Amer: >60 mL/min (ref >60)
Glucose, Bld: 290 mg/dL — ABNORMAL HIGH (ref 65–99)
POTASSIUM: 3.8 mmol/L (ref 3.5–5.1)
SODIUM: 139 mmol/L (ref 135–145)
Total Protein: 7.6 g/dL (ref 6.5–8.1)

## 2016-11-12 LAB — CBC
HCT: 38.4 % (ref 35.0–47.0)
Hemoglobin: 12.5 g/dL (ref 12.0–16.0)
MCH: 25.5 pg — AB (ref 26.0–34.0)
MCHC: 32.5 g/dL (ref 32.0–36.0)
MCV: 78.4 fL — ABNORMAL LOW (ref 80.0–100.0)
PLATELETS: 321 10*3/uL (ref 150–440)
RBC: 4.9 MIL/uL (ref 3.80–5.20)
RDW: 13.5 % (ref 11.5–14.5)
WBC: 8.7 10*3/uL (ref 3.6–11.0)

## 2016-11-12 LAB — LIPASE, BLOOD: LIPASE: 12 U/L (ref 11–51)

## 2016-11-12 MED ORDER — AZITHROMYCIN 250 MG PO TABS: ORAL_TABLET | ORAL | 0 refills | Status: AC

## 2016-11-12 MED ORDER — AMOXICILLIN 500 MG PO CAPS
1000.0000 mg | ORAL_CAPSULE | Freq: Once | ORAL | Status: AC
  Administered 2016-11-12: 1000 mg via ORAL
  Filled 2016-11-12: qty 2

## 2016-11-12 MED ORDER — AZITHROMYCIN 500 MG PO TABS
500.0000 mg | ORAL_TABLET | Freq: Once | ORAL | Status: AC
  Administered 2016-11-12: 500 mg via ORAL
  Filled 2016-11-12: qty 1

## 2016-11-12 MED ORDER — AMOXICILLIN 500 MG PO CAPS: 1000.0000 mg | ORAL_CAPSULE | Freq: Two times a day (BID) | ORAL | 0 refills | Status: AC

## 2016-11-12 NOTE — ED Triage Notes (Signed)
Pt c/o URI symptoms like congestion, runny nose and cough.  Pt c/o pain to ribs. Yellow productive cough.  Woke up in middle of night with sweat all over, had to change bed sheets and shower.  Also c/o pain to upper abdomen that is separate from rib pain.  Has had nausea as well, no vomiting or diarrhea.

## 2016-11-12 NOTE — ED Provider Notes (Signed)
[facility] Emergency Department Provider Note  ____________________________________________   First MD Initiated Contact with Patient 11/12/16 1522     (approximate)  I have reviewed the triage vital signs and the nursing notes.   HISTORY  Chief Complaint Abdominal Pain and URI   HPI Audrey Chan is a 66 y.o. female who self presents to the emergency department with 3-4 days of gradual onset moderate severity shortness of breath and productive cough. She's had subjective fever but no chills. Her cough is green. She has no history of COPD, asthma, or smoking. She has no leg swelling. She's had sharp aching chest pain worse with coughing improved when not coughing. She does have mild sore throat. She has no sick contacts. She has runny nose. She has aching discomfort to her right upper ribs more than left upper ribs worse with coughing.   Past Medical History:  Diagnosis Date  . Allergic rhinitis   . Cholelithiasis   . Diabetes type 2, controlled ([place])   . GERD (gastroesophageal reflux disease)   . Hypertension   . Neuropathy   . Renal insufficiency 01/20/2016    Patient Active Problem List   Diagnosis Date Noted  . Pulmonary nodules 01/28/2015  . Supraclavicular adenopathy 01/21/2015  . Abnormal MRI, lumbar spine 01/01/2015  . Weight loss, unintentional 01/01/2015  . Lymphocytosis 01/01/2015  . Microcytic red blood cells 01/01/2015  . Recurrent biliary colic 19/41/7408  . Calculus of gallbladder with acute on chronic cholecystitis without obstruction 12/22/2014  . Encounter for fitting or adjustment of insulin pump 08/09/2014  . Diabetic peripheral neuropathy associated with type 2 diabetes mellitus ([place]) 08/09/2014  . BP (high blood pressure) 05/24/2012  . Diabetes mellitus ([place]) 05/24/2012  . Idiopathic localized osteoarthropathy 04/09/2012  . Dermatitis, eczematoid 02/27/2012  . Can't get food down 09/26/2011    Past Surgical  History:  Procedure Laterality Date  . ABDOMINAL HYSTERECTOMY  1972  . CERVICAL FUSION    . CHOLECYSTECTOMY N/A 12/28/2014   Procedure: LAPAROSCOPIC CHOLECYSTECTOMY;  Surgeon: Sherri Rad, MD;  Location: [facility] ORS;  Service: General;  Laterality: N/A;    Prior to Admission medications   Medication Sig Start Date End Date Taking? Authorizing Provider  acetaminophen (TYLENOL) 500 MG tablet Take 500 mg by mouth. 05/17/12   [provider]  albuterol (PROVENTIL HFA;VENTOLIN HFA) 108 (90 BASE) MCG/ACT inhaler Inhale 2 puffs into the lungs every 6 (six) hours as needed for wheezing or shortness of breath.     [provider]  amoxicillin (AMOXIL) 500 MG capsule Take 2 capsules (1,000 mg total) by mouth 2 (two) times daily. 11/12/16 11/19/16  Darel Hong, MD  aspirin EC 81 MG tablet Take 81 mg by mouth daily.    [provider]  atorvastatin (LIPITOR) 40 MG tablet Take 40 mg by mouth daily at 6 PM.  11/16/14   [provider]  azithromycin (ZITHROMAX Z-PAK) 250 MG tablet Take 2 tablets (500 mg) on  Day 1,  followed by 1 tablet (250 mg) once daily on Days 2 through 5. 11/12/16 11/17/16  Darel Hong, MD  cetirizine (ZYRTEC) 10 MG tablet Take by mouth daily.     [provider]  Cholecalciferol (VITAMIN D3) 1000 units CAPS Take 1,000 Units by mouth daily.     [provider]  diclofenac sodium (VOLTAREN) 1 % GEL Apply 2 g topically 4 (four) times daily. 04/15/16   Laban Emperor, PA-C  ferrous sulfate 325 (65 FE) MG tablet Take  325 mg by mouth daily.    [provider]  fluticasone (FLONASE) 50 MCG/ACT nasal spray Place 2 sprays into both nostrils daily as needed for allergies or rhinitis.     [provider]  gabapentin (NEURONTIN) 300 MG capsule Take 300 mg by mouth 3 (three) times daily.  09/26/11   [provider]  glucose blood (BAYER CONTOUR TEST) test strip Use 4 (four) times daily. Contour next strips 03/13/16 03/13/17   [provider]  hydrochlorothiazide (HYDRODIURIL) 25 MG tablet Take 25 mg by mouth daily.    [provider]  insulin glulisine (APIDRA) 100 UNIT/ML injection Inject into the skin See admin instructions. Continuously via pump    [provider]  insulin glulisine (APIDRA) 100 UNIT/ML injection Take up to 50 units daily in insulin pump, as directed 12/14/15   [provider]  ipratropium (ATROVENT HFA) 17 MCG/ACT inhaler Inhale into the lungs as needed.     [provider]  lisinopril (PRINIVIL,ZESTRIL) 10 MG tablet Take 10 mg by mouth daily.    [provider]  metFORMIN (GLUCOPHAGE-XR) 500 MG 24 hr tablet Take 500 mg by mouth 2 (two) times daily.    [provider]  Multiple Vitamin (MULTI-VITAMINS) TABS Take 1 tablet by mouth daily.    [provider]  polyethylene glycol powder (GLYCOLAX/MIRALAX) powder 0.5 Containers as needed.  10/08/15   [provider]  ranitidine (ZANTAC) 150 MG tablet Take 150 mg by mouth daily.  05/03/15   [provider]  rosuvastatin (CRESTOR) 20 MG tablet 20 mg daily.  05/02/13   [provider]  traMADol (ULTRAM) 50 MG tablet Take 1 tablet (50 mg total) by mouth 3 (three) times daily. 10/16/16   Molli Barrows, MD    Allergies Patient has no known allergies.  Family History  Problem Relation Age of Onset  . Lung cancer Father     Social History Social History  Substance Use Topics  . Smoking status: Former Smoker    Quit date: 02/21/1999  . Smokeless tobacco: Former Systems developer  . Alcohol use No    Review of Systems Constitutional: positive fevers and chills Eyes: No visual changes. ENT: No sore throat. Cardiovascular: positive chest pain. Respiratory: positive shortness of breath. Gastrointestinal: No abdominal pain.  No nausea, no vomiting.  No diarrhea.  No constipation. Genitourinary: Negative for dysuria. Musculoskeletal: Negative for back pain. Skin:  Negative for rash. Neurological: Negative for headaches, focal weakness or numbness.   ____________________________________________   PHYSICAL EXAM:  VITAL SIGNS: ED Triage Vitals [11/12/16 1139]  Enc Vitals Group     BP (!) 158/80     Pulse Rate 92     Resp 18     Temp 98.2 F (36.8 C)     Temp Source Oral     SpO2 98 %     Weight 145 lb (65.8 kg)     Height 5\' 7"  (1.702 m)     Head Circumference      Peak Flow      Pain Score      Pain Loc      Pain Edu?      Excl. in [place]?     Constitutional: alert and oriented 4 well appearing nontoxic no diaphoresis speaks in full clear sentences Eyes: PERRL EOMI. Head: Atraumatic. Nose: No congestion/rhinnorhea. Mouth/Throat: No trismus Neck: No stridor.   Cardiovascular: Normal rate, regular rhythm. Grossly normal heart sounds.  Good peripheral circulation. Respiratory: slightly increased respiratory  effort. Rhonchi and left mid lung fields but moving good air Gastrointestinal: soft nontender Musculoskeletal: No lower extremity edema   Neurologic:  Normal speech and language. No gross focal neurologic deficits are appreciated. Skin:  Skin is warm, dry and intact. No rash noted. Psychiatric: Mood and affect are normal. Speech and behavior are normal.    ____________________________________________   DIFFERENTIAL includes but not limited to  pneumonia, influenza, COPD, pulmonary embolism, viral syndrome ____________________________________________   LABS (all labs ordered are listed, but only abnormal results are displayed)  Labs Reviewed  COMPREHENSIVE METABOLIC PANEL - Abnormal; Notable for the following:       Result Value   Glucose, Bld 290 (*)    AST 44 (*)    Total Bilirubin 0.1 (*)    All other components within normal limits  CBC - Abnormal; Notable for the following:    MCV 78.4 (*)    MCH 25.5 (*)    All other components within normal limits  URINALYSIS, COMPLETE (UACMP) WITH MICROSCOPIC - Abnormal;  Notable for the following:    Color, Urine STRAW (*)    APPearance CLEAR (*)    Glucose, UA 150 (*)    Leukocytes, UA SMALL (*)    Squamous Epithelial / LPF 0-5 (*)    All other components within normal limits  LIPASE, BLOOD    blood work reviewed and interpreted by me shows elevated glucose with no signs of diabetic ketoacidosis __________________________________________  EKG  ED ECG REPORT I, Darel Hong, the attending physician, personally viewed and interpreted this ECG.  Date: 11/12/2016 EKG Time:  Rate: 80 Rhythm: normal sinus rhythm QRS Axis: normal Intervals: normal ST/T Wave abnormalities: normal Narrative Interpretation: no evidence of acute ischemia _________________________________________  RADIOLOGY  chest x-ray reviewed by me shows lingular pneumonia ____________________________________________   PROCEDURES  Procedure(s) performed: no  Procedures  Critical Care performed: no  Observation: no ____________________________________________   INITIAL IMPRESSION / ASSESSMENT AND PLAN / ED COURSE  Pertinent labs & imaging results that were available during my care of the patient were reviewed by me and considered in my medical decision making (see chart for details).  the patient arrives with a fever, shortness of breath, unilateral rhonchi, productive cough, and an x-ray concerning for a lingular infiltrate. This constellation of symptoms is almost consistent with community-acquired pneumonia. Her curb 65 score is 1 which places her in a low risk group. She is able to ambulate without difficulty. At this point I feel she is appropriate for outpatient management with strict return precautions. I will treat her with azithromycin as well as amoxicillin high-dose for strep pneumo coverage. Strict return precautions given and the patient verbalizes understanding and agreement with the plan.      ____________________________________________   FINAL  CLINICAL IMPRESSION(S) / ED DIAGNOSES  Final diagnoses:  Community acquired pneumonia of left lung, unspecified part of lung      NEW MEDICATIONS STARTED DURING THIS VISIT:  New Prescriptions   AMOXICILLIN (AMOXIL) 500 MG CAPSULE    Take 2 capsules (1,000 mg total) by mouth 2 (two) times daily.   AZITHROMYCIN (ZITHROMAX Z-PAK) 250 MG TABLET    Take 2 tablets (500 mg) on  Day 1,  followed by 1 tablet (250 mg) once daily on Days 2 through 5.     Note:  This document was prepared using Dragon voice recognition software and may include unintentional dictation errors.     Darel Hong, MD 11/12/16 1546

## 2016-11-12 NOTE — Discharge Instructions (Signed)
Please take all of your antibiotics as prescribed and make an appointment to follow-up with your primary care physician this week for reevaluation. Return to the emergency department sooner for any concerns such as worsening fever, if you feel short of breath, if you cannot eat or drink, or for any other concerns whatsoever.  It was a pleasure to take care of you today, and thank you for coming to our emergency department.  If you have any questions or concerns before leaving please ask the nurse to grab me and I'm more than happy to go through your aftercare instructions again.  If you were prescribed any opioid pain medication today such as Norco, Vicodin, Percocet, morphine, hydrocodone, or oxycodone please make sure you do not drive when you are taking this medication as it can alter your ability to drive safely.  If you have any concerns once you are home that you are not improving or are in fact getting worse before you can make it to your follow-up appointment, please do not hesitate to call [phone] and come back for further evaluation.  Darel Hong, MD  Results for orders placed or performed during the hospital encounter of 11/12/16  Lipase, blood  Result Value Ref Range   Lipase 12 11 - 51 U/L  Comprehensive metabolic panel  Result Value Ref Range   Sodium 139 135 - 145 mmol/L   Potassium 3.8 3.5 - 5.1 mmol/L   Chloride 101 101 - 111 mmol/L   CO2 29 22 - 32 mmol/L   Glucose, Bld 290 (H) 65 - 99 mg/dL   BUN 8 6 - 20 mg/dL   Creatinine, Ser 0.95 0.44 - 1.00 mg/dL   Calcium 8.9 8.9 - 10.3 mg/dL   Total Protein 7.6 6.5 - 8.1 g/dL   Albumin 3.5 3.5 - 5.0 g/dL   AST 44 (H) 15 - 41 U/L   ALT 44 14 - 54 U/L   Alkaline Phosphatase 94 38 - 126 U/L   Total Bilirubin 0.1 (L) 0.3 - 1.2 mg/dL   GFR calc non Af Amer >60 >60 mL/min   GFR calc Af Amer >60 >60 mL/min   Anion gap 9 5 - 15  CBC  Result Value Ref Range   WBC 8.7 3.6 - 11.0 K/uL   RBC 4.90 3.80 - 5.20 MIL/uL   Hemoglobin 12.5  12.0 - 16.0 g/dL   HCT 38.4 35.0 - 47.0 %   MCV 78.4 (L) 80.0 - 100.0 fL   MCH 25.5 (L) 26.0 - 34.0 pg   MCHC 32.5 32.0 - 36.0 g/dL   RDW 13.5 11.5 - 14.5 %   Platelets 321 150 - 440 K/uL  Urinalysis, Complete w Microscopic  Result Value Ref Range   Color, Urine STRAW (A) YELLOW   APPearance CLEAR (A) CLEAR   Specific Gravity, Urine 1.005 1.005 - 1.030   pH 7.0 5.0 - 8.0   Glucose, UA 150 (A) NEGATIVE mg/dL   Hgb urine dipstick NEGATIVE NEGATIVE   Bilirubin Urine NEGATIVE NEGATIVE   Ketones, ur NEGATIVE NEGATIVE mg/dL   Protein, ur NEGATIVE NEGATIVE mg/dL   Nitrite NEGATIVE NEGATIVE   Leukocytes, UA SMALL (A) NEGATIVE   RBC / HPF NONE SEEN 0 - 5 RBC/hpf   WBC, UA 6-30 0 - 5 WBC/hpf   Bacteria, UA NONE SEEN NONE SEEN   Squamous Epithelial / LPF 0-5 (A) NONE SEEN   Mucus PRESENT    Dg Chest 2 View  Result Date: 11/12/2016 CLINICAL DATA:  Congestion.  Productive cough. EXAM: CHEST  2 VIEW COMPARISON:  December 04, 2007 FINDINGS: No pneumothorax. The left heart border is partially obscured raising the possibility of lingular infiltrate. Mild interstitial prominence. No nodule or mass. No other acute abnormalities. IMPRESSION: 1. Suggested lingular infiltrate. Recommend follow-up to resolution. 2. 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 Electronically Signed   By: Dorise Bullion III M.D   On: 11/12/2016 12:44

## 2016-11-22 ENCOUNTER — Ambulatory Visit (HOSPITAL_BASED_OUTPATIENT_CLINIC_OR_DEPARTMENT_OTHER): Payer: Medicare HMO | Admitting: Anesthesiology

## 2016-11-22 ENCOUNTER — Ambulatory Visit
Admission: RE | Admit: 2016-11-22 | Discharge: 2016-11-22 | Disposition: A | Discharge status: Home / Self Care | Payer: Medicare HMO | Source: Ambulatory Visit | Attending: Anesthesiology | Admitting: Anesthesiology

## 2016-11-22 ENCOUNTER — Other Ambulatory Visit: Payer: Self-pay | Admitting: Anesthesiology

## 2016-11-22 ENCOUNTER — Encounter: Payer: Self-pay | Admitting: Anesthesiology

## 2016-11-22 VITALS — BP 150/82 | HR 84 | Temp 98.5°F | Resp 16 | Ht 67.0 in | Wt 145.0 lb

## 2016-11-22 DIAGNOSIS — M25551 Pain in right hip: Secondary | ICD-10-CM | POA: Insufficient documentation

## 2016-11-22 DIAGNOSIS — M5386 Other specified dorsopathies, lumbar region: Secondary | ICD-10-CM

## 2016-11-22 DIAGNOSIS — Z79899 Other long term (current) drug therapy: Secondary | ICD-10-CM | POA: Diagnosis not present

## 2016-11-22 DIAGNOSIS — R52 Pain, unspecified: Secondary | ICD-10-CM

## 2016-11-22 DIAGNOSIS — G8929 Other chronic pain: Secondary | ICD-10-CM

## 2016-11-22 DIAGNOSIS — M5442 Lumbago with sciatica, left side: Secondary | ICD-10-CM

## 2016-11-22 DIAGNOSIS — Z7982 Long term (current) use of aspirin: Secondary | ICD-10-CM | POA: Diagnosis not present

## 2016-11-22 DIAGNOSIS — M4696 Unspecified inflammatory spondylopathy, lumbar region: Secondary | ICD-10-CM | POA: Diagnosis not present

## 2016-11-22 DIAGNOSIS — M5116 Intervertebral disc disorders with radiculopathy, lumbar region: Secondary | ICD-10-CM | POA: Insufficient documentation

## 2016-11-22 DIAGNOSIS — M5136 Other intervertebral disc degeneration, lumbar region: Secondary | ICD-10-CM

## 2016-11-22 DIAGNOSIS — Z794 Long term (current) use of insulin: Secondary | ICD-10-CM | POA: Diagnosis not present

## 2016-11-22 DIAGNOSIS — M25552 Pain in left hip: Secondary | ICD-10-CM | POA: Insufficient documentation

## 2016-11-22 DIAGNOSIS — M5441 Lumbago with sciatica, right side: Secondary | ICD-10-CM

## 2016-11-22 DIAGNOSIS — M47816 Spondylosis without myelopathy or radiculopathy, lumbar region: Secondary | ICD-10-CM

## 2016-11-22 DIAGNOSIS — M5387 Other specified dorsopathies, lumbosacral region: Secondary | ICD-10-CM

## 2016-11-22 MED ORDER — LIDOCAINE HCL (PF) 1 % IJ SOLN
5.0000 mL | Freq: Once | INTRAMUSCULAR | Status: AC
  Administered 2016-11-22: 5 mL via SUBCUTANEOUS
  Filled 2016-11-22: qty 5

## 2016-11-22 MED ORDER — MIDAZOLAM HCL 5 MG/5ML IJ SOLN
INTRAMUSCULAR | Status: AC
  Filled 2016-11-22: qty 5

## 2016-11-22 MED ORDER — LACTATED RINGERS IV SOLN
1000.0000 mL | INTRAVENOUS | Status: DC
Start: 2016-11-22 — End: 2016-11-22
  Administered 2016-11-22: 1000 mL via INTRAVENOUS

## 2016-11-22 MED ORDER — TRIAMCINOLONE ACETONIDE 40 MG/ML IJ SUSP
40.0000 mg | Freq: Once | INTRAMUSCULAR | Status: AC
  Administered 2016-11-22: 40 mg
  Filled 2016-11-22: qty 1

## 2016-11-22 MED ORDER — SODIUM CHLORIDE 0.9% FLUSH
10.0000 mL | Freq: Once | INTRAVENOUS | Status: AC
  Administered 2016-11-22: 10 mL

## 2016-11-22 MED ORDER — SODIUM CHLORIDE 0.9 % IJ SOLN
INTRAMUSCULAR | Status: AC
  Filled 2016-11-22: qty 10

## 2016-11-22 MED ORDER — ROPIVACAINE HCL 2 MG/ML IJ SOLN
10.0000 mL | Freq: Once | INTRAMUSCULAR | Status: AC
  Administered 2016-11-22: 20 mL via EPIDURAL
  Filled 2016-11-22: qty 10

## 2016-11-22 MED ORDER — MIDAZOLAM HCL 2 MG/2ML IJ SOLN
5.0000 mg | Freq: Once | INTRAMUSCULAR | Status: AC
  Administered 2016-11-22: 2 mg via INTRAVENOUS

## 2016-11-22 NOTE — Progress Notes (Signed)
Subjective:  Patient ID: Audrey Chan, female    DOB: 1951-01-14  Age: 66 y.o. MRN: [id number]  CC: Hip Pain (bilaterl)   Procedure: L5-S1 epidural steroid under fluoroscopic guidance and moderate sedation   Previous PROCEDURE:Bilateral lumbar facet medial branch block #2 under fluoroscopic guidance and moderate sedation at L2-3 L3-4 L4-5 and L5-S1 Left side L3-4 L4-5 L5-S1   Previous procedure: Left side L3-4 L4-5 L5-S1 and S1 medial branch block   HPI Audrey Chan presents for reevaluation. It's been several months since she's had any injection therapy. Previously she had a facet injection for her primarily left-sided lower back pain. She is also had a bilateral injection reports today that her right side lower back pain is 50% improved. Her primary pain complaint revolves around left side lower back pain with radiation into the hip and buttock area area she has continuous numbness and tingling affecting the feet on the left side. The pain is less frequent on the right. She presents today scheduled for an epidural steroid injection. Otherwise no change in her lower extremity strength or functioning is noted her bowel bladder function  has been stable and she is taking her Ultram at 3 times a day.  Outpatient Medications Prior to Visit  Medication Sig Dispense Refill  . acetaminophen (TYLENOL) 500 MG tablet Take 500 mg by mouth.    Marland Kitchen albuterol (PROVENTIL HFA;VENTOLIN HFA) 108 (90 BASE) MCG/ACT inhaler Inhale 2 puffs into the lungs every 6 (six) hours as needed for wheezing or shortness of breath.     Marland Kitchen aspirin EC 81 MG tablet Take 81 mg by mouth daily.    Marland Kitchen atorvastatin (LIPITOR) 40 MG tablet Take 40 mg by mouth daily at 6 PM.     . cetirizine (ZYRTEC) 10 MG tablet Take by mouth daily.     . Cholecalciferol (VITAMIN D3) 1000 units CAPS Take 1,000 Units by mouth daily.     . diclofenac sodium (VOLTAREN) 1 % GEL Apply 2 g topically 4 (four) times daily. 100 g 0  . ferrous  sulfate 325 (65 FE) MG tablet Take 325 mg by mouth daily.    . fluticasone (FLONASE) 50 MCG/ACT nasal spray Place 2 sprays into both nostrils daily as needed for allergies or rhinitis.     Marland Kitchen gabapentin (NEURONTIN) 300 MG capsule Take 300 mg by mouth 3 (three) times daily.     Marland Kitchen glucose blood (BAYER CONTOUR TEST) test strip Use 4 (four) times daily. Contour next strips    . hydrochlorothiazide (HYDRODIURIL) 25 MG tablet Take 25 mg by mouth daily.    . insulin glulisine (APIDRA) 100 UNIT/ML injection Inject into the skin See admin instructions. Continuously via pump    . insulin glulisine (APIDRA) 100 UNIT/ML injection Take up to 50 units daily in insulin pump, as directed    . ipratropium (ATROVENT HFA) 17 MCG/ACT inhaler Inhale into the lungs as needed.     Marland Kitchen lisinopril (PRINIVIL,ZESTRIL) 10 MG tablet Take 10 mg by mouth daily.    . metFORMIN (GLUCOPHAGE-XR) 500 MG 24 hr tablet Take 500 mg by mouth 2 (two) times daily.    . Multiple Vitamin (MULTI-VITAMINS) TABS Take 1 tablet by mouth daily.    . polyethylene glycol powder (GLYCOLAX/MIRALAX) powder 0.5 Containers as needed.     . ranitidine (ZANTAC) 150 MG tablet Take 150 mg by mouth daily.     . rosuvastatin (CRESTOR) 20 MG tablet 20 mg daily.     . traMADol Veatrice Bourbon)  50 MG tablet Take 1 tablet (50 mg total) by mouth 3 (three) times daily. 90 tablet 1   No facility-administered medications prior to visit.     Review of Systems Cardiac: No angina or palpitations CNS: No confusion or dizziness GI: No constipation or abdominal pain  Objective:  BP (!) 150/82   Pulse 84   Temp 98.5 F (36.9 C) (Oral)   Resp 16   Ht 5\' 7"  (1.702 m)   Wt 145 lb (65.8 kg)   SpO2 97%   BMI 22.71 kg/m    BP Readings from Last 3 Encounters:  11/22/16 (!) 150/82  11/12/16 (!) 174/81  10/16/16 119/76     Wt Readings from Last 3 Encounters:  11/22/16 145 lb (65.8 kg)  11/12/16 145 lb (65.8 kg)  10/16/16 147 lb (66.7 kg)     Physical Exam Pt is  alert and oriented PERRL EOMI HEART IS RRR no murmur or rub LCTA no wheezing or rhales MUSCULOSKELETALReveals a persistent positive straight leg raise left side and some paraspinous muscle tenderness but no overt trigger points.  Labs  No results found for: HGBA1C Lab Results  Component Value Date   CREATININE 0.95 11/12/2016    -------------------------------------------------------------------------------------------------------------------- Lab Results  Component Value Date   WBC 8.7 11/12/2016   HGB 12.5 11/12/2016   HCT 38.4 11/12/2016   PLT 321 11/12/2016   GLUCOSE 290 (H) 11/12/2016   ALT 44 11/12/2016   AST 44 (H) 11/12/2016   NA 139 11/12/2016   K 3.8 11/12/2016   CL 101 11/12/2016   CREATININE 0.95 11/12/2016   BUN 8 11/12/2016   CO2 29 11/12/2016    --------------------------------------------------------------------------------------------------------------------- Dg C-arm 1-60 Min-no Report  Result Date: 11/22/2016 Fluoroscopy was utilized by the requesting physician.  No radiographic interpretation.     Assessment & Plan:   Audrey Chan was seen today for hip pain.  Diagnoses and all orders for this visit:  Facet arthritis of lumbar region ([place]) -     triamcinolone acetonide (KENALOG-40) injection 40 mg; 1 mL (40 mg total) by Other route once. -     sodium chloride flush (NS) 0.9 % injection 10 mL; 10 mLs by Other route once. -     ropivacaine (PF) 2 mg/mL (0.2%) (NAROPIN) injection 10 mL; 10 mLs by Epidural route once. -     midazolam (VERSED) injection 5 mg; Inject 5 mLs (5 mg total) into the vein once. -     lidocaine (PF) (XYLOCAINE) 1 % injection 5 mL; Inject 5 mLs into the skin once. -     lactated ringers infusion 1,000 mL; Inject 1,000 mLs into the vein continuous. -     LUMBAR FACET(MEDIAL BRANCH NERVE BLOCK) MBNB; Future  Chronic bilateral low back pain with bilateral sciatica -     Lumbar Epidural Injection  DDD (degenerative disc  disease), lumbar  Sciatica of left side associated with disorder of lumbosacral spine  Sciatica of right side associated with disorder of lumbar spine        ----------------------------------------------------------------------------------------------------------------------  Problem List Items Addressed This Visit    None    Visit Diagnoses    Facet arthritis of lumbar region [facility])    -  Primary   Relevant Medications   triamcinolone acetonide (KENALOG-40) injection 40 mg (Completed)   sodium chloride flush (NS) 0.9 % injection 10 mL (Completed)   ropivacaine (PF) 2 mg/mL (0.2%) (NAROPIN) injection 10 mL (Completed)   midazolam (VERSED) injection 5 mg (Completed)   lidocaine (  PF) (XYLOCAINE) 1 % injection 5 mL (Completed)   lactated ringers infusion 1,000 mL   Other Relevant Orders   LUMBAR FACET(MEDIAL BRANCH NERVE BLOCK) MBNB   Chronic bilateral low back pain with bilateral sciatica       Relevant Medications   triamcinolone acetonide (KENALOG-40) injection 40 mg (Completed)   midazolam (VERSED) injection 5 mg (Completed)   DDD (degenerative disc disease), lumbar       Relevant Medications   triamcinolone acetonide (KENALOG-40) injection 40 mg (Completed)   Sciatica of left side associated with disorder of lumbosacral spine       Relevant Medications   midazolam (VERSED) injection 5 mg (Completed)   Sciatica of right side associated with disorder of lumbar spine       Relevant Medications   midazolam (VERSED) injection 5 mg (Completed)        ----------------------------------------------------------------------------------------------------------------------  1. Chronic bilateral low back pain with bilateral sciatica We'll proceed with an epidural steroid injection today to see if we can help with her persistent left side lower back pain and left posterior lateral leg pain. She continues to have radiculitis in the L4 and L5 distribution left side. We will gone over  the risks and benefits of procedure in full detail all questions are answered with a schedule return to clinic in 1 month for repeat injection if indicated. In the meantime she is to continue back stretching strengthening exercises as discussed and continue with Ultram for pain relief. - Lumbar Epidural Injection  2. Facet arthritis of lumbar region [facility]) As above - triamcinolone acetonide (KENALOG-40) injection 40 mg; 1 mL (40 mg total) by Other route once. - sodium chloride flush (NS) 0.9 % injection 10 mL; 10 mLs by Other route once. - ropivacaine (PF) 2 mg/mL (0.2%) (NAROPIN) injection 10 mL; 10 mLs by Epidural route once. - midazolam (VERSED) injection 5 mg; Inject 5 mLs (5 mg total) into the vein once. - lidocaine (PF) (XYLOCAINE) 1 % injection 5 mL; Inject 5 mLs into the skin once. - lactated ringers infusion 1,000 mL; Inject 1,000 mLs into the vein continuous. - LUMBAR FACET(MEDIAL BRANCH NERVE BLOCK) MBNB; Future  3. DDD (degenerative disc disease), lumbar As above  4. Sciatica of left side associated with disorder of lumbosacral spine   5. Sciatica of right side associated with disorder of lumbar spine     ----------------------------------------------------------------------------------------------------------------------  I am having Ms. Salina April maintain her albuterol, fluticasone, lisinopril, hydrochlorothiazide, insulin glulisine, MULTI-VITAMINS, ferrous sulfate, metFORMIN, aspirin EC, atorvastatin, ranitidine, diclofenac sodium, glucose blood, cetirizine, Vitamin D3, polyethylene glycol powder, insulin glulisine, acetaminophen, rosuvastatin, ipratropium, gabapentin, and traMADol. We administered triamcinolone acetonide, sodium chloride flush, ropivacaine (PF) 2 mg/mL (0.2%), midazolam, lidocaine (PF), and lactated ringers.   Meds ordered this encounter  Medications  . triamcinolone acetonide (KENALOG-40) injection 40 mg  . sodium chloride flush (NS) 0.9 % injection 10 mL   . ropivacaine (PF) 2 mg/mL (0.2%) (NAROPIN) injection 10 mL  . midazolam (VERSED) injection 5 mg  . lidocaine (PF) (XYLOCAINE) 1 % injection 5 mL  . lactated ringers infusion 1,000 mL   Patient's Medications  New Prescriptions   No medications on file  Previous Medications   ACETAMINOPHEN (TYLENOL) 500 MG TABLET    Take 500 mg by mouth.   ALBUTEROL (PROVENTIL HFA;VENTOLIN HFA) 108 (90 BASE) MCG/ACT INHALER    Inhale 2 puffs into the lungs every 6 (six) hours as needed for wheezing or shortness of breath.    ASPIRIN EC 81 MG TABLET  Take 81 mg by mouth daily.   ATORVASTATIN (LIPITOR) 40 MG TABLET    Take 40 mg by mouth daily at 6 PM.    CETIRIZINE (ZYRTEC) 10 MG TABLET    Take by mouth daily.    CHOLECALCIFEROL (VITAMIN D3) 1000 UNITS CAPS    Take 1,000 Units by mouth daily.    DICLOFENAC SODIUM (VOLTAREN) 1 % GEL    Apply 2 g topically 4 (four) times daily.   FERROUS SULFATE 325 (65 FE) MG TABLET    Take 325 mg by mouth daily.   FLUTICASONE (FLONASE) 50 MCG/ACT NASAL SPRAY    Place 2 sprays into both nostrils daily as needed for allergies or rhinitis.    GABAPENTIN (NEURONTIN) 300 MG CAPSULE    Take 300 mg by mouth 3 (three) times daily.    GLUCOSE BLOOD (BAYER CONTOUR TEST) TEST STRIP    Use 4 (four) times daily. Contour next strips   HYDROCHLOROTHIAZIDE (HYDRODIURIL) 25 MG TABLET    Take 25 mg by mouth daily.   INSULIN GLULISINE (APIDRA) 100 UNIT/ML INJECTION    Inject into the skin See admin instructions. Continuously via pump   INSULIN GLULISINE (APIDRA) 100 UNIT/ML INJECTION    Take up to 50 units daily in insulin pump, as directed   IPRATROPIUM (ATROVENT HFA) 17 MCG/ACT INHALER    Inhale into the lungs as needed.    LISINOPRIL (PRINIVIL,ZESTRIL) 10 MG TABLET    Take 10 mg by mouth daily.   METFORMIN (GLUCOPHAGE-XR) 500 MG 24 HR TABLET    Take 500 mg by mouth 2 (two) times daily.   MULTIPLE VITAMIN (MULTI-VITAMINS) TABS    Take 1 tablet by mouth daily.   POLYETHYLENE GLYCOL POWDER  (GLYCOLAX/MIRALAX) POWDER    0.5 Containers as needed.    RANITIDINE (ZANTAC) 150 MG TABLET    Take 150 mg by mouth daily.    ROSUVASTATIN (CRESTOR) 20 MG TABLET    20 mg daily.    TRAMADOL (ULTRAM) 50 MG TABLET    Take 1 tablet (50 mg total) by mouth 3 (three) times daily.  Modified Medications   No medications on file  Discontinued Medications   No medications on file   ---------------------------------------------------------------------------------------------------------------------- Procedure: L5-S1 epidural steroid No. 1 under fluoroscopic guidance and moderate sedation   Procedure: L5-S1 LESI with fluoroscopic guidance and moderate sedation  NOTE: The risks, benefits, and expectations of the procedure have been discussed and explained to the patient who was understanding and in agreement with suggested treatment plan. No guarantees were made.  DESCRIPTION OF PROCEDURE: Lumbar epidural steroid injection with 2 mg IV Versed, EKG, blood pressure, pulse, and pulse oximetry monitoring. The procedure was performed with the patient in the prone position under fluoroscopic guidance. I injected subcutaneous lidocaine overlying the L5-S1 site after its fluoroscopic identifictation.  Using strict aseptic technique, I then advanced an 18-gauge Tuohy epidural needle in the midline using interlaminar approach via loss-of-resistance to saline technique. There was negative aspiration for heme or  CSF.  I then confirmed position with both AP and Lateral fluoroscan. 2 cc of Isovue were injected and a  total of 5 mL of Preservative-Free normal saline mixed with 40 mg of Kenalog and 1cc Ropicaine 0.2 percent were injected incrementally via the  epidurally placed needle. The needle was removed. The patient tolerated the injection well and was convalesced and discharged to home in stable condition. Should the patient have any post procedure difficulty they have been instructed on how to contact us  for assistance.     Follow-up: Return for evaluation, procedure.    Molli Barrows, MD

## 2016-11-22 NOTE — Progress Notes (Signed)
Safety precautions to be maintained throughout the outpatient stay will include: orient to surroundings, keep bed in low position, maintain call bell within reach at all times, provide assistance with transfer out of bed and ambulation.  

## 2016-11-22 NOTE — Patient Instructions (Addendum)
GENERAL RISKS AND COMPLICATIONS  What are the risk, side effects and possible complications? Generally speaking, most procedures are safe.  However, with any procedure there are risks, side effects, and the possibility of complications.  The risks and complications are dependent upon the sites that are lesioned, or the type of nerve block to be performed.  The closer the procedure is to the spine, the more serious the risks are.  Great care is taken when placing the radio frequency needles, block needles or lesioning probes, but sometimes complications can occur. 1. Infection: Any time there is an injection through the skin, there is a risk of infection.  This is why sterile conditions are used for these blocks.  There are four possible types of infection. 1. Localized skin infection. 2. Central Nervous System Infection-This can be in the form of Meningitis, which can be deadly. 3. Epidural Infections-This can be in the form of an epidural abscess, which can cause pressure inside of the spine, causing compression of the spinal cord with subsequent paralysis. This would require an emergency surgery to decompress, and there are no guarantees that the patient would recover from the paralysis. 4. Discitis-This is an infection of the intervertebral discs.  It occurs in about 1% of discography procedures.  It is difficult to treat and it may lead to surgery.        2. Pain: the needles have to go through skin and soft tissues, will cause soreness.       3. Damage to internal structures:  The nerves to be lesioned may be near blood vessels or    other nerves which can be potentially damaged.       4. Bleeding: Bleeding is more common if the patient is taking blood thinners such as  aspirin, Coumadin, Ticiid, Plavix, etc., or if he/she have some genetic predisposition  such as hemophilia. Bleeding into the spinal canal can cause compression of the spinal  cord with subsequent paralysis.  This would require an  emergency surgery to  decompress and there are no guarantees that the patient would recover from the  paralysis.       5. Pneumothorax:  Puncturing of a lung is a possibility, every time a needle is introduced in  the area of the chest or upper back.  Pneumothorax refers to free air around the  collapsed lung(s), inside of the thoracic cavity (chest cavity).  Another two possible  complications related to a similar event would include: Hemothorax and Chylothorax.   These are variations of the Pneumothorax, where instead of air around the collapsed  lung(s), you may have blood or chyle, respectively.       6. Spinal headaches: They may occur with any procedures in the area of the spine.       7. Persistent CSF (Cerebro-Spinal Fluid) leakage: This is a rare problem, but may occur  with prolonged intrathecal or epidural catheters either due to the formation of a fistulous  track or a dural tear.       8. Nerve damage: By working so close to the spinal cord, there is always a possibility of  nerve damage, which could be as serious as a permanent spinal cord injury with  paralysis.       9. Death:  Although rare, severe deadly allergic reactions known as "Anaphylactic  reaction" can occur to any of the medications used.      10. Worsening of the symptoms:  We can always make thing worse.    What are the chances of something like this happening? Chances of any of this occuring are extremely low.  By statistics, you have more of a chance of getting killed in a motor vehicle accident: while driving to the hospital than any of the above occurring .  Nevertheless, you should be aware that they are possibilities.  In general, it is similar to taking a shower.  Everybody knows that you can slip, hit your head and get killed.  Does that mean that you should not shower again?  Nevertheless always keep in mind that statistics do not mean anything if you happen to be on the wrong side of them.  Even if a procedure has a 1  (one) in a 1,000,000 (million) chance of going wrong, it you happen to be that one..Also, keep in mind that by statistics, you have more of a chance of having something go wrong when taking medications.  Who should not have this procedure? If you are on a blood thinning medication (e.g. Coumadin, Plavix, see list of "Blood Thinners"), or if you have an active infection going on, you should not have the procedure.  If you are taking any blood thinners, please inform your physician.  How should I prepare for this procedure?  Do not eat or drink anything at least six hours prior to the procedure.  Bring a driver with you .  It cannot be a taxi.  Come accompanied by an adult that can drive you back, and that is strong enough to help you if your legs get weak or numb from the local anesthetic.  Take all of your medicines the morning of the procedure with just enough water to swallow them.  If you have diabetes, make sure that you are scheduled to have your procedure done first thing in the morning, whenever possible.  If you have diabetes, take only half of your insulin dose and notify our nurse that you have done so as soon as you arrive at the clinic.  If you are diabetic, but only take blood sugar pills (oral hypoglycemic), then do not take them on the morning of your procedure.  You may take them after you have had the procedure.  Do not take aspirin or any aspirin-containing medications, at least eleven (11) days prior to the procedure.  They may prolong bleeding.  Wear loose fitting clothing that may be easy to take off and that you would not mind if it got stained with Betadine or blood.  Do not wear any jewelry or perfume  Remove any nail coloring.  It will interfere with some of our monitoring equipment.  NOTE: Remember that this is not meant to be interpreted as a complete list of all possible complications.  Unforeseen problems may occur.  BLOOD THINNERS The following drugs  contain aspirin or other products, which can cause increased bleeding during surgery and should not be taken for 2 weeks prior to and 1 week after surgery.  If you should need take something for relief of minor pain, you may take acetaminophen which is found in Tylenol,m Datril, Anacin-3 and Panadol. It is not blood thinner. The products listed below are.  Do not take any of the products listed below in addition to any listed on your instruction sheet.  A.P.C or A.P.C with Codeine Codeine Phosphate Capsules #3 Ibuprofen Ridaura  ABC compound Congesprin Imuran rimadil  Advil Cope Indocin Robaxisal  Alka-Seltzer Effervescent Pain Reliever and Antacid Coricidin or Coricidin-D  Indomethacin Rufen    Alka-Seltzer plus Cold Medicine Cosprin Ketoprofen S-A-C Tablets  Anacin Analgesic Tablets or Capsules Coumadin Korlgesic Salflex  Anacin Extra Strength Analgesic tablets or capsules CP-2 Tablets Lanoril Salicylate  Anaprox Cuprimine Capsules Levenox Salocol  Anexsia-D Dalteparin Magan Salsalate  Anodynos Darvon compound Magnesium Salicylate Sine-off  Ansaid Dasin Capsules Magsal Sodium Salicylate  Anturane Depen Capsules Marnal Soma  APF Arthritis pain formula Dewitt's Pills Measurin Stanback  Argesic Dia-Gesic Meclofenamic Sulfinpyrazone  Arthritis Bayer Timed Release Aspirin Diclofenac Meclomen Sulindac  Arthritis pain formula Anacin Dicumarol Medipren Supac  Analgesic (Safety coated) Arthralgen Diffunasal Mefanamic Suprofen  Arthritis Strength Bufferin Dihydrocodeine Mepro Compound Suprol  Arthropan liquid Dopirydamole Methcarbomol with Aspirin Synalgos  ASA tablets/Enseals Disalcid Micrainin Tagament  Ascriptin Doan's Midol Talwin  Ascriptin A/D Dolene Mobidin Tanderil  Ascriptin Extra Strength Dolobid Moblgesic Ticlid  Ascriptin with Codeine Doloprin or Doloprin with Codeine Momentum Tolectin  Asperbuf Duoprin Mono-gesic Trendar  Aspergum Duradyne Motrin or Motrin IB Triminicin  Aspirin  plain, buffered or enteric coated Durasal Myochrisine Trigesic  Aspirin Suppositories Easprin Nalfon Trillsate  Aspirin with Codeine Ecotrin Regular or Extra Strength Naprosyn Uracel  Atromid-S Efficin Naproxen Ursinus  Auranofin Capsules Elmiron Neocylate Vanquish  Axotal Emagrin Norgesic Verin  Azathioprine Empirin or Empirin with Codeine Normiflo Vitamin E  Azolid Emprazil Nuprin Voltaren  Bayer Aspirin plain, buffered or children's or timed BC Tablets or powders Encaprin Orgaran Warfarin Sodium  Buff-a-Comp Enoxaparin Orudis Zorpin  Buff-a-Comp with Codeine Equegesic Os-Cal-Gesic   Buffaprin Excedrin plain, buffered or Extra Strength Oxalid   Bufferin Arthritis Strength Feldene Oxphenbutazone   Bufferin plain or Extra Strength Feldene Capsules Oxycodone with Aspirin   Bufferin with Codeine Fenoprofen Fenoprofen Pabalate or Pabalate-SF   Buffets II Flogesic Panagesic   Buffinol plain or Extra Strength Florinal or Florinal with Codeine Panwarfarin   Buf-Tabs Flurbiprofen Penicillamine   Butalbital Compound Four-way cold tablets Penicillin   Butazolidin Fragmin Pepto-Bismol   Carbenicillin Geminisyn Percodan   Carna Arthritis Reliever Geopen Persantine   Carprofen Gold's salt Persistin   Chloramphenicol Goody's Phenylbutazone   Chloromycetin Haltrain Piroxlcam   Clmetidine heparin Plaquenil   Cllnoril Hyco-pap Ponstel   Clofibrate Hydroxy chloroquine Propoxyphen         Before stopping any of these medications, be sure to consult the physician who ordered them.  Some, such as Coumadin (Warfarin) are ordered to prevent or treat serious conditions such as "deep thrombosis", "pumonary embolisms", and other heart problems.  The amount of time that you may need off of the medication may also vary with the medication and the reason for which you were taking it.  If you are taking any of these medications, please make sure you notify your pain physician before you undergo any  procedures.         Epidural Steroid Injection An epidural steroid injection is a shot of steroid medicine and numbing medicine that is given into the space between the spinal cord and the bones in your back (epidural space). The shot helps relieve pain caused by an irritated or swollen nerve root. The amount of pain relief you get from the injection depends on what is causing the nerve to be swollen and irritated, and how long your pain lasts. You are more likely to benefit from this injection if your pain is strong and comes on suddenly rather than if you have had pain for a long time. Tell a health care provider about:  Any allergies you have.  All medicines you are taking, including vitamins, herbs,   eye drops, creams, and over-the-counter medicines.  Any problems you or family members have had with anesthetic medicines.  Any blood disorders you have.  Any surgeries you have had.  Any medical conditions you have.  Whether you are pregnant or may be pregnant. What are the risks? Generally, this is a safe procedure. However, problems may occur, including:  Headache.  Bleeding.  Infection.  Allergic reaction to medicines.  Damage to your nerves.  What happens before the procedure? Staying hydrated Follow instructions from your health care provider about hydration, which may include:  Up to 2 hours before the procedure - you may continue to drink clear liquids, such as water, clear fruit juice, black coffee, and plain tea.  Eating and drinking restrictions Follow instructions from your health care provider about eating and drinking, which may include:  8 hours before the procedure - stop eating heavy meals or foods such as meat, fried foods, or fatty foods.  6 hours before the procedure - stop eating light meals or foods, such as toast or cereal.  6 hours before the procedure - stop drinking milk or drinks that contain milk.  2 hours before the procedure - stop  drinking clear liquids.  Medicine  You may be given medicines to lower anxiety.  Ask your health care provider about: ? Changing or stopping your regular medicines. This is especially important if you are taking diabetes medicines or blood thinners. ? Taking medicines such as aspirin and ibuprofen. These medicines can thin your blood. Do not take these medicines before your procedure if your health care provider instructs you not to. General instructions  Plan to have someone take you home from the hospital or clinic. What happens during the procedure?  You may receive a medicine to help you relax (sedative).  You will be asked to lie on your abdomen.  The injection site will be cleaned.  A numbing medicine (local anesthetic) will be used to numb the injection site.  A needle will be inserted through your skin into the epidural space. You may feel some discomfort when this happens. An X-ray machine will be used to make sure the needle is put as close as possible to the affected nerve.  A steroid medicine and a local anesthetic will be injected into the epidural space.  The needle will be removed.  A bandage (dressing) will be put over the injection site. What happens after the procedure?  Your blood pressure, heart rate, breathing rate, and blood oxygen level will be monitored until the medicines you were given have worn off.  Your arm or leg may feel weak or numb for a few hours.  The injection site may feel sore.  Do not drive for 24 hours if you received a sedative. This information is not intended to replace advice given to you by your health care provider. Make sure you discuss any questions you have with your health care provider. Document Released: 05/16/2007 Document Revised: 07/21/2015 Document Reviewed: 05/25/2015 Elsevier Interactive Patient Education  2017 [employer]. Pain Management Discharge Instructions  General Discharge Instructions :  If you need to  reach your doctor call: Monday-Friday 8:00 am - 4:00 pm at [phone] or toll free [phone].  After clinic hours [phone] to have operator reach doctor.  Bring all of your medication bottles to all your appointments in the pain clinic.  To cancel or reschedule your appointment with Pain Management please remember to call 24 hours in advance to avoid a fee.  Refer to the educational materials which you have been given on: General Risks, I had my Procedure. Discharge Instructions, Post Sedation.  Post Procedure Instructions:  The drugs you were given will stay in your system until tomorrow, so for the next 24 hours you should not drive, make any legal decisions or drink any alcoholic beverages.  You may eat anything you prefer, but it is better to start with liquids then soups and crackers, and gradually work up to solid foods.  Please notify your doctor immediately if you have any unusual bleeding, trouble breathing or pain that is not related to your normal pain.  Depending on the type of procedure that was done, some parts of your body may feel week and/or numb.  This usually clears up by tonight or the next day.  Walk with the use of an assistive device or accompanied by an adult for the 24 hours.  You may use ice on the affected area for the first 24 hours.  Put ice in a Ziploc bag and cover with a towel and place against area 15 minutes on 15 minutes off.  You may switch to heat after 24 hours.

## 2016-11-23 ENCOUNTER — Telehealth: Payer: Self-pay

## 2016-11-23 NOTE — Telephone Encounter (Signed)
Post procedure phone call.  States she is doing OK.   

## 2017-01-22 ENCOUNTER — Other Ambulatory Visit: Payer: Self-pay | Admitting: Family Medicine

## 2017-01-22 DIAGNOSIS — Z1231 Encounter for screening mammogram for malignant neoplasm of breast: Secondary | ICD-10-CM

## 2017-02-15 ENCOUNTER — Encounter: Payer: Self-pay | Admitting: Podiatry

## 2017-02-15 ENCOUNTER — Ambulatory Visit (INDEPENDENT_AMBULATORY_CARE_PROVIDER_SITE_OTHER): Payer: Medicare PPO | Admitting: Podiatry

## 2017-02-15 DIAGNOSIS — E1142 Type 2 diabetes mellitus with diabetic polyneuropathy: Secondary | ICD-10-CM | POA: Diagnosis not present

## 2017-02-15 DIAGNOSIS — M205X9 Other deformities of toe(s) (acquired), unspecified foot: Secondary | ICD-10-CM | POA: Diagnosis not present

## 2017-02-15 DIAGNOSIS — L603 Nail dystrophy: Secondary | ICD-10-CM

## 2017-02-15 NOTE — Progress Notes (Signed)
   Subjective:    Patient ID: Audrey Chan, female    DOB: [birth year], 66 y.o.   MRN: [id number]  HPI this patient presents to the office with chief complaint of a problem with the tip of the toenail on the left foot.  She says this area is painful walking and wearing her shoes.  She says that she  was seen by PCP who prescribed amoxicillin for the toe. She says the toe still has soreness and redness and numbness.  She believes she may have an ingrowing toenail.  She denies any history of trauma or injury to the toe.  She denies any drainage coming from the toe.  She presents the office today for an evaluation and treatment of this painful left big toenail. This patient is diabetic with neuropathy.    Review of Systems     Objective:   Physical Exam General Appearance  Alert, conversant and in no acute stress.  Vascular  Dorsalis pedis and posterior pulses are palpable  bilaterally.  Capillary return is within normal limits  bilaterally. Temperature is within normal limits  Bilaterally.  Neurologic  Senn-Weinstein monofilament wire test within normal limits  bilaterally. Muscle power within normal limits bilaterally.  Nails Thick disfigured discolored nails with subungual debris bilaterally on  hallux  bilaterally. No evidence of bacterial infection or drainage bilaterally.  Orthopedic  No limitations of motion of motion feet bilaterally.  No crepitus or effusions noted.  Patient has bony exostosis noted at the head of the first metatarsal left foot.  Upon loading. There is minimal motion in the first MPJ.  This is consistent with a functional hallux limitus.  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  No signs of infections or ulcers noted.          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  IE  Discussed this condition with this patient.  Explained she has a thickened  nail and callus at the distal aspect of the left big toe due to bony deformity of the 1st MPJ and IPJ left foot.  Debridement of the nail was performed.  Upon leaving. She said she's having no pain and discomfort and scheduled to return to the office in 3 months for continued evaluation and treatment   Gardiner Barefoot DPM

## 2017-03-23 ENCOUNTER — Ambulatory Visit
Admission: RE | Admit: 2017-03-23 | Discharge: 2017-03-23 | Disposition: A | Discharge status: Home / Self Care | Payer: Medicare PPO | Source: Ambulatory Visit | Attending: Family Medicine | Admitting: Family Medicine

## 2017-03-23 DIAGNOSIS — Z1231 Encounter for screening mammogram for malignant neoplasm of breast: Secondary | ICD-10-CM | POA: Diagnosis not present

## 2017-03-29 ENCOUNTER — Encounter: Payer: Self-pay | Admitting: Anesthesiology

## 2017-03-29 ENCOUNTER — Other Ambulatory Visit: Payer: Self-pay

## 2017-03-29 ENCOUNTER — Ambulatory Visit: Payer: Medicare PPO | Attending: Anesthesiology | Admitting: Anesthesiology

## 2017-03-29 VITALS — BP 137/76 | HR 81 | Temp 97.2°F | Resp 16 | Ht 66.0 in | Wt 148.0 lb

## 2017-03-29 DIAGNOSIS — M5432 Sciatica, left side: Secondary | ICD-10-CM

## 2017-03-29 DIAGNOSIS — M5441 Lumbago with sciatica, right side: Secondary | ICD-10-CM | POA: Insufficient documentation

## 2017-03-29 DIAGNOSIS — G8929 Other chronic pain: Secondary | ICD-10-CM | POA: Diagnosis present

## 2017-03-29 DIAGNOSIS — M545 Low back pain: Secondary | ICD-10-CM | POA: Diagnosis present

## 2017-03-29 DIAGNOSIS — M5442 Lumbago with sciatica, left side: Secondary | ICD-10-CM | POA: Diagnosis not present

## 2017-03-29 DIAGNOSIS — M4696 Unspecified inflammatory spondylopathy, lumbar region: Secondary | ICD-10-CM | POA: Insufficient documentation

## 2017-03-29 DIAGNOSIS — Z7982 Long term (current) use of aspirin: Secondary | ICD-10-CM | POA: Diagnosis not present

## 2017-03-29 DIAGNOSIS — M5136 Other intervertebral disc degeneration, lumbar region: Secondary | ICD-10-CM | POA: Diagnosis not present

## 2017-03-29 DIAGNOSIS — Z79899 Other long term (current) drug therapy: Secondary | ICD-10-CM | POA: Insufficient documentation

## 2017-03-29 DIAGNOSIS — Z794 Long term (current) use of insulin: Secondary | ICD-10-CM | POA: Insufficient documentation

## 2017-03-29 DIAGNOSIS — M47816 Spondylosis without myelopathy or radiculopathy, lumbar region: Secondary | ICD-10-CM

## 2017-03-29 MED ORDER — TRAMADOL HCL 50 MG PO TABS: 50.0000 mg | ORAL_TABLET | Freq: Three times a day (TID) | ORAL | 1 refills | Status: DC

## 2017-03-29 NOTE — Progress Notes (Signed)
Subjective:  Patient ID: Audrey Chan, female    DOB: 11/25/1950  Age: 67 y.o. MRN: [id number]  CC: Back Pain (lower)   Procedure: None  HPI Audrey Chan presents for return evaluation.  She had a facet block back in November and at this point is having minimal low back pain.  She uses an occasional Ultram averaging 1 and sometimes 2 tablets/day for her pain relief.  It is worse with prolonged standing and primarily involves the hip and buttock region with low back pain.  No change in bowel or bladder function or lower extremity strength or function are noted at this time.  She uses the Ultram without any difficulty and based on her narcotic assessment sheet she does well with this regimen with minimal side effects.  Otherwise she is in her usual state of health at this point she is trying to do some ambulation and walking and core strengthening exercises as reviewed today.  Outpatient Medications Prior to Visit  Medication Sig Dispense Refill  . acetaminophen (TYLENOL) 500 MG tablet Take 500 mg by mouth.    Marland Kitchen albuterol (PROVENTIL HFA;VENTOLIN HFA) 108 (90 BASE) MCG/ACT inhaler Inhale 2 puffs into the lungs every 6 (six) hours as needed for wheezing or shortness of breath.     Marland Kitchen aspirin EC 81 MG tablet Take 81 mg by mouth daily.    Marland Kitchen atorvastatin (LIPITOR) 40 MG tablet Take 40 mg by mouth daily at 6 PM.     . cetirizine (ZYRTEC) 10 MG tablet Take by mouth daily.     . Cholecalciferol (VITAMIN D3) 1000 units CAPS Take 1,000 Units by mouth daily.     . diclofenac sodium (VOLTAREN) 1 % GEL Apply 2 g topically 4 (four) times daily. 100 g 0  . ferrous sulfate 325 (65 FE) MG tablet Take 325 mg by mouth daily.    . fluconazole (DIFLUCAN) 150 MG tablet     . fluticasone (FLONASE) 50 MCG/ACT nasal spray Place 2 sprays into both nostrils daily as needed for allergies or rhinitis.     Marland Kitchen gabapentin (NEURONTIN) 800 MG tablet     . hydrochlorothiazide (HYDRODIURIL) 25 MG tablet Take 25 mg by  mouth daily.    . IBU 800 MG tablet     . insulin glulisine (APIDRA) 100 UNIT/ML injection Inject into the skin See admin instructions. Continuously via pump    . insulin glulisine (APIDRA) 100 UNIT/ML injection Take up to 50 units daily in insulin pump, as directed    . ipratropium (ATROVENT HFA) 17 MCG/ACT inhaler Inhale into the lungs as needed.     Marland Kitchen lisinopril (PRINIVIL,ZESTRIL) 10 MG tablet Take 10 mg by mouth daily.    . metFORMIN (GLUCOPHAGE-XR) 500 MG 24 hr tablet Take 500 mg by mouth 2 (two) times daily.    . Multiple Vitamin (MULTI-VITAMINS) TABS Take 1 tablet by mouth daily.    . polyethylene glycol powder (GLYCOLAX/MIRALAX) powder 0.5 Containers as needed.     . ranitidine (ZANTAC) 150 MG tablet Take 150 mg by mouth daily.     . rosuvastatin (CRESTOR) 20 MG tablet 20 mg daily.     . traMADol (ULTRAM) 50 MG tablet Take 1 tablet (50 mg total) by mouth 3 (three) times daily. 90 tablet 1   No facility-administered medications prior to visit.     Review of Systems CNS: No confusion or sedation Cardiac: No angina or palpitations GI: No abdominal pain or constipation Constitutional: No nausea vomiting fevers  or chills  Objective:  BP 137/76   Pulse 81   Temp (!) 97.2 F (36.2 C)   Resp 16   Ht 5\' 6"  (1.676 m)   Wt 148 lb (67.1 kg)   SpO2 96%   BMI 23.89 kg/m    BP Readings from Last 3 Encounters:  03/29/17 137/76  11/22/16 (!) 150/82  11/12/16 (!) 174/81     Wt Readings from Last 3 Encounters:  03/29/17 148 lb (67.1 kg)  11/22/16 145 lb (65.8 kg)  11/12/16 145 lb (65.8 kg)     Physical Exam Pt is alert and oriented PERRL EOMI HEART IS RRR no murmur or rub LCTA no wheezing or rales MUSCULOSKELETAL reveals some mild paraspinous muscle tenderness but no overt trigger points.  She has a negative straight leg raise bilaterally.  She ambulates without difficulty.  She has good muscle tone and bulk to the lower extremities.  Labs  No results found for:  HGBA1C Lab Results  Component Value Date   CREATININE 0.95 11/12/2016    -------------------------------------------------------------------------------------------------------------------- Lab Results  Component Value Date   WBC 8.7 11/12/2016   HGB 12.5 11/12/2016   HCT 38.4 11/12/2016   PLT 321 11/12/2016   GLUCOSE 290 (H) 11/12/2016   ALT 44 11/12/2016   AST 44 (H) 11/12/2016   NA 139 11/12/2016   K 3.8 11/12/2016   CL 101 11/12/2016   CREATININE 0.95 11/12/2016   BUN 8 11/12/2016   CO2 29 11/12/2016    --------------------------------------------------------------------------------------------------------------------- Mm Digital Screening Bilateral  Result Date: 03/23/2017 CLINICAL DATA:  Screening. EXAM: DIGITAL SCREENING BILATERAL MAMMOGRAM WITH CAD COMPARISON:  Previous exam(s). ACR Breast Density Category c: The breast tissue is heterogeneously dense, which may obscure small masses. FINDINGS: There are no findings suspicious for malignancy. Images were processed with CAD. IMPRESSION: No mammographic evidence of malignancy. A result letter of this screening mammogram will be mailed directly to the patient. RECOMMENDATION: Screening mammogram in one year. (Code:SM-B-01Y) BI-RADS CATEGORY  1: Negative. Electronically Signed   By: Lovey Newcomer M.D.   On: 03/23/2017 10:50     Assessment & Plan:   Audrey Chan was seen today for back pain.  Diagnoses and all orders for this visit:  Chronic bilateral low back pain with bilateral sciatica  Facet arthritis of lumbar region [facility])  DDD (degenerative disc disease), lumbar  Sciatica of left side  Other orders -     traMADol (ULTRAM) 50 MG tablet; Take 1 tablet (50 mg total) by mouth 3 (three) times daily.        ----------------------------------------------------------------------------------------------------------------------  Problem List Items Addressed This Visit    None    Visit Diagnoses    Chronic bilateral  low back pain with bilateral sciatica    -  Primary   Relevant Medications   traMADol (ULTRAM) 50 MG tablet   Facet arthritis of lumbar region ([place])       Relevant Medications   traMADol (ULTRAM) 50 MG tablet   DDD (degenerative disc disease), lumbar       Relevant Medications   traMADol (ULTRAM) 50 MG tablet   Sciatica of left side            ----------------------------------------------------------------------------------------------------------------------  1. Chronic bilateral low back pain with bilateral sciatica We will have her continue with her core strengthening and ambulatory exercises.  We will defer to repeat evaluation in 2 months.  She can contact us if she needs to have any further interventional therapy provided.  At this point she  appears to be doing well with the tramadol.  We have reviewed the [facility] practitioner database information and it is appropriate.  Refill was given x1 on her tramadol for 3 tablets/day as needed as necessary.  2. Facet arthritis of lumbar region [facility]) As above  3. DDD (degenerative disc disease), lumbar As above  4. Sciatica of left side     ----------------------------------------------------------------------------------------------------------------------  I am having Audrey Chan maintain her albuterol, fluticasone, lisinopril, hydrochlorothiazide, insulin glulisine, MULTI-VITAMINS, ferrous sulfate, metFORMIN, aspirin EC, atorvastatin, ranitidine, diclofenac sodium, cetirizine, Vitamin D3, polyethylene glycol powder, insulin glulisine, acetaminophen, rosuvastatin, ipratropium, fluconazole, gabapentin, IBU, and traMADol.   Meds ordered this encounter  Medications  . traMADol (ULTRAM) 50 MG tablet    Sig: Take 1 tablet (50 mg total) by mouth 3 (three) times daily.    Dispense:  90 tablet    Refill:  1   Patient's Medications  New Prescriptions   No medications on file  Previous Medications   ACETAMINOPHEN  (TYLENOL) 500 MG TABLET    Take 500 mg by mouth.   ALBUTEROL (PROVENTIL HFA;VENTOLIN HFA) 108 (90 BASE) MCG/ACT INHALER    Inhale 2 puffs into the lungs every 6 (six) hours as needed for wheezing or shortness of breath.    ASPIRIN EC 81 MG TABLET    Take 81 mg by mouth daily.   ATORVASTATIN (LIPITOR) 40 MG TABLET    Take 40 mg by mouth daily at 6 PM.    CETIRIZINE (ZYRTEC) 10 MG TABLET    Take by mouth daily.    CHOLECALCIFEROL (VITAMIN D3) 1000 UNITS CAPS    Take 1,000 Units by mouth daily.    DICLOFENAC SODIUM (VOLTAREN) 1 % GEL    Apply 2 g topically 4 (four) times daily.   FERROUS SULFATE 325 (65 FE) MG TABLET    Take 325 mg by mouth daily.   FLUCONAZOLE (DIFLUCAN) 150 MG TABLET       FLUTICASONE (FLONASE) 50 MCG/ACT NASAL SPRAY    Place 2 sprays into both nostrils daily as needed for allergies or rhinitis.    GABAPENTIN (NEURONTIN) 800 MG TABLET       HYDROCHLOROTHIAZIDE (HYDRODIURIL) 25 MG TABLET    Take 25 mg by mouth daily.   IBU 800 MG TABLET       INSULIN GLULISINE (APIDRA) 100 UNIT/ML INJECTION    Inject into the skin See admin instructions. Continuously via pump   INSULIN GLULISINE (APIDRA) 100 UNIT/ML INJECTION    Take up to 50 units daily in insulin pump, as directed   IPRATROPIUM (ATROVENT HFA) 17 MCG/ACT INHALER    Inhale into the lungs as needed.    LISINOPRIL (PRINIVIL,ZESTRIL) 10 MG TABLET    Take 10 mg by mouth daily.   METFORMIN (GLUCOPHAGE-XR) 500 MG 24 HR TABLET    Take 500 mg by mouth 2 (two) times daily.   MULTIPLE VITAMIN (MULTI-VITAMINS) TABS    Take 1 tablet by mouth daily.   POLYETHYLENE GLYCOL POWDER (GLYCOLAX/MIRALAX) POWDER    0.5 Containers as needed.    RANITIDINE (ZANTAC) 150 MG TABLET    Take 150 mg by mouth daily.    ROSUVASTATIN (CRESTOR) 20 MG TABLET    20 mg daily.   Modified Medications   Modified Medication Previous Medication   TRAMADOL (ULTRAM) 50 MG TABLET traMADol (ULTRAM) 50 MG tablet      Take 1 tablet (50 mg total) by mouth 3 (three) times  daily.    Take 1 tablet (  50 mg total) by mouth 3 (three) times daily.  Discontinued Medications   No medications on file   ----------------------------------------------------------------------------------------------------------------------  Follow-up: Return in about 2 months (around 05/27/2017) for evaluation, med refill.    Molli Barrows, MD

## 2017-03-29 NOTE — Progress Notes (Signed)
Safety precautions to be maintained throughout the outpatient stay will include: orient to surroundings, keep bed in low position, maintain call bell within reach at all times, provide assistance with transfer out of bed and ambulation.  

## 2017-06-14 ENCOUNTER — Telehealth: Payer: Self-pay | Admitting: *Deleted

## 2017-06-14 NOTE — Telephone Encounter (Signed)
Attempted to call patient, message left. 

## 2017-06-18 NOTE — Telephone Encounter (Signed)
I will start the prior authorization process and call the patient when I get the auth.

## 2017-06-18 NOTE — Telephone Encounter (Signed)
Pain in left lower back, goes down the leg .

## 2017-06-18 NOTE — Telephone Encounter (Signed)
Spoke with Dr. Andree Elk. Will do LESI. Attempted to call patient, message left. Pre procedure instructions left on voicemail.  Secretaries. Please call patient to schedule or get auth. Dr. Andree Elk is putting in the order.

## 2017-06-18 NOTE — Addendum Note (Signed)
Addended by: Molli Barrows on: 06/18/2017 02:36 PM   Modules accepted: Orders

## 2017-07-11 ENCOUNTER — Telehealth: Payer: Self-pay | Admitting: Anesthesiology

## 2017-07-11 NOTE — Telephone Encounter (Signed)
Patient states she is out of meds and her appt for procedure is scheduled for 07-19-17. She would like tramadol called in to pharmacy

## 2017-07-11 NOTE — Telephone Encounter (Signed)
No answer. LVM for patient to return call so we can verify pharmacy before calling in Tramadol. OK per Dr. Andree Elk to call in 1 month's supply.

## 2017-07-12 ENCOUNTER — Telehealth: Payer: Self-pay | Admitting: *Deleted

## 2017-07-12 NOTE — Telephone Encounter (Signed)
Pharmacy is [facility] on [address].

## 2017-07-12 NOTE — Telephone Encounter (Signed)
Tramadol 50 mg #90, take 1 tablet by mouth tid prn with 0 refills called into pharmacy.   Spoke with Caryl Pina pharmacist.  Patient notified.

## 2017-07-19 ENCOUNTER — Ambulatory Visit
Admission: RE | Admit: 2017-07-19 | Discharge: 2017-07-19 | Disposition: A | Discharge status: Home / Self Care | Payer: Medicare PPO | Source: Ambulatory Visit | Attending: Anesthesiology | Admitting: Anesthesiology

## 2017-07-19 ENCOUNTER — Other Ambulatory Visit: Payer: Self-pay

## 2017-07-19 ENCOUNTER — Encounter: Payer: Self-pay | Admitting: Anesthesiology

## 2017-07-19 ENCOUNTER — Ambulatory Visit (HOSPITAL_BASED_OUTPATIENT_CLINIC_OR_DEPARTMENT_OTHER): Payer: Medicare PPO | Admitting: Anesthesiology

## 2017-07-19 ENCOUNTER — Other Ambulatory Visit: Payer: Self-pay | Admitting: Anesthesiology

## 2017-07-19 VITALS — BP 149/72 | HR 87 | Temp 98.2°F | Resp 20 | Ht 66.0 in | Wt 145.6 lb

## 2017-07-19 DIAGNOSIS — G8929 Other chronic pain: Secondary | ICD-10-CM

## 2017-07-19 DIAGNOSIS — M4686 Other specified inflammatory spondylopathies, lumbar region: Secondary | ICD-10-CM | POA: Insufficient documentation

## 2017-07-19 DIAGNOSIS — Z79899 Other long term (current) drug therapy: Secondary | ICD-10-CM | POA: Insufficient documentation

## 2017-07-19 DIAGNOSIS — M5432 Sciatica, left side: Secondary | ICD-10-CM

## 2017-07-19 DIAGNOSIS — M5442 Lumbago with sciatica, left side: Secondary | ICD-10-CM | POA: Diagnosis not present

## 2017-07-19 DIAGNOSIS — R52 Pain, unspecified: Secondary | ICD-10-CM

## 2017-07-19 DIAGNOSIS — M5441 Lumbago with sciatica, right side: Secondary | ICD-10-CM | POA: Insufficient documentation

## 2017-07-19 DIAGNOSIS — R0602 Shortness of breath: Secondary | ICD-10-CM | POA: Insufficient documentation

## 2017-07-19 DIAGNOSIS — M545 Low back pain: Secondary | ICD-10-CM | POA: Diagnosis present

## 2017-07-19 DIAGNOSIS — M47816 Spondylosis without myelopathy or radiculopathy, lumbar region: Secondary | ICD-10-CM

## 2017-07-19 DIAGNOSIS — M79605 Pain in left leg: Secondary | ICD-10-CM | POA: Diagnosis not present

## 2017-07-19 DIAGNOSIS — M5136 Other intervertebral disc degeneration, lumbar region: Secondary | ICD-10-CM | POA: Diagnosis not present

## 2017-07-19 MED ORDER — TRIAMCINOLONE ACETONIDE 40 MG/ML IJ SUSP
40.0000 mg | Freq: Once | INTRAMUSCULAR | Status: AC
  Administered 2017-07-19: 40 mg
  Filled 2017-07-19: qty 1

## 2017-07-19 MED ORDER — IOPAMIDOL (ISOVUE-M 200) INJECTION 41%
20.0000 mL | Freq: Once | INTRAMUSCULAR | Status: DC | PRN
  Administered 2017-07-19: 10 mL
  Filled 2017-07-19: qty 20

## 2017-07-19 MED ORDER — SODIUM CHLORIDE 0.9% FLUSH
10.0000 mL | Freq: Once | INTRAVENOUS | Status: AC
  Administered 2017-07-19: 10 mL

## 2017-07-19 MED ORDER — ROPIVACAINE HCL 2 MG/ML IJ SOLN
10.0000 mL | Freq: Once | INTRAMUSCULAR | Status: AC
  Administered 2017-07-19: 10 mL via EPIDURAL
  Filled 2017-07-19: qty 10

## 2017-07-19 MED ORDER — LIDOCAINE HCL (PF) 1 % IJ SOLN
5.0000 mL | Freq: Once | INTRAMUSCULAR | Status: AC
  Administered 2017-07-19: 5 mL via SUBCUTANEOUS
  Filled 2017-07-19: qty 5

## 2017-07-19 MED ORDER — IOPAMIDOL (ISOVUE-M 200) INJECTION 41%
INTRAMUSCULAR | Status: AC
Start: 2017-07-19 — End: ?
  Filled 2017-07-19: qty 10

## 2017-07-19 MED ORDER — TRAMADOL HCL 50 MG PO TABS: 50.0000 mg | ORAL_TABLET | Freq: Three times a day (TID) | ORAL | 1 refills | Status: DC

## 2017-07-19 MED ORDER — SODIUM CHLORIDE 0.9 % IJ SOLN
INTRAMUSCULAR | Status: AC
Start: 2017-07-19 — End: ?
  Filled 2017-07-19: qty 10

## 2017-07-19 NOTE — Patient Instructions (Signed)

## 2017-07-20 NOTE — Progress Notes (Signed)
Subjective:  Patient ID: Audrey Chan, female    DOB: 1951/01/23  Age: 67 y.o. MRN: [id number]  CC: Back Pain (lower)   Procedure: L5-S1 epidural steroid under fluoroscopic guidance without sedation  HPI Audrey Chan presents for evaluation.  She continues to have some pain in the left lower extremity more so than on the right side.  She has been taking her Ultram as prescribed.  She also takes an occasional ibuprofen.  The quality characteristic distribution the pain are as otherwise mentioned.  Her last epidural injection was back in October and she did very well with this with approximately 75% relief lasting several months.  She desires to proceed with a repeat injection today.  She has been doing her stretching strengthening exercises as best tolerated.  Outpatient Medications Prior to Visit  Medication Sig Dispense Refill  . acetaminophen (TYLENOL) 500 MG tablet Take 500 mg by mouth.    Marland Kitchen albuterol (PROVENTIL HFA;VENTOLIN HFA) 108 (90 BASE) MCG/ACT inhaler Inhale 2 puffs into the lungs every 6 (six) hours as needed for wheezing or shortness of breath.     Marland Kitchen aspirin EC 81 MG tablet Take 81 mg by mouth daily.    Marland Kitchen atorvastatin (LIPITOR) 40 MG tablet Take 40 mg by mouth daily at 6 PM.     . cetirizine (ZYRTEC) 10 MG tablet Take by mouth daily.     . Cholecalciferol (VITAMIN D3) 1000 units CAPS Take 1,000 Units by mouth daily.     . diclofenac sodium (VOLTAREN) 1 % GEL Apply 2 g topically 4 (four) times daily. 100 g 0  . ferrous sulfate 325 (65 FE) MG tablet Take 325 mg by mouth daily.    . fluticasone (FLONASE) 50 MCG/ACT nasal spray Place 2 sprays into both nostrils daily as needed for allergies or rhinitis.     Marland Kitchen gabapentin (NEURONTIN) 800 MG tablet     . hydrochlorothiazide (HYDRODIURIL) 25 MG tablet Take 25 mg by mouth daily.    . IBU 800 MG tablet     . insulin glulisine (APIDRA) 100 UNIT/ML injection Inject into the skin See admin instructions. Continuously via pump     . insulin glulisine (APIDRA) 100 UNIT/ML injection Take up to 50 units daily in insulin pump, as directed    . ipratropium (ATROVENT HFA) 17 MCG/ACT inhaler Inhale into the lungs as needed.     Marland Kitchen lisinopril (PRINIVIL,ZESTRIL) 10 MG tablet Take 10 mg by mouth daily.    . metFORMIN (GLUCOPHAGE-XR) 500 MG 24 hr tablet Take 500 mg by mouth 2 (two) times daily.    . Multiple Vitamin (MULTI-VITAMINS) TABS Take 1 tablet by mouth daily.    . polyethylene glycol powder (GLYCOLAX/MIRALAX) powder 0.5 Containers as needed.     . ranitidine (ZANTAC) 150 MG tablet Take 150 mg by mouth daily.     . rosuvastatin (CRESTOR) 20 MG tablet 20 mg daily.     . fluconazole (DIFLUCAN) 150 MG tablet     . traMADol (ULTRAM) 50 MG tablet Take 1 tablet (50 mg total) by mouth 3 (three) times daily. (Patient not taking: Reported on 07/19/2017) 90 tablet 1   No facility-administered medications prior to visit.     Review of Systems CNS: No confusion or sedation Cardiac: No angina or palpitations GI: No abdominal pain or constipation Constitutional: No nausea vomiting fevers or chills  Objective:  BP (!) 149/72   Pulse 87   Temp 98.2 F (36.8 C)   Resp 20  Ht 5\' 6"  (1.676 m)   Wt 145 lb 9.6 oz (66 kg)   SpO2 98%   BMI 23.50 kg/m    BP Readings from Last 3 Encounters:  07/19/17 (!) 149/72  03/29/17 137/76  11/22/16 (!) 150/82     Wt Readings from Last 3 Encounters:  07/19/17 145 lb 9.6 oz (66 kg)  03/29/17 148 lb (67.1 kg)  11/22/16 145 lb (65.8 kg)     Physical Exam Pt is alert and oriented PERRL EOMI HEART IS RRR no murmur or rub LCTA no wheezing or rales MUSCULOSKELETAL reveals some paraspinous muscle tenderness and a positive straight leg raise on the left side negative on the right.  Her muscle tone and bulk is at baseline.  Labs  No results found for: HGBA1C Lab Results  Component Value Date   CREATININE 0.95 11/12/2016     -------------------------------------------------------------------------------------------------------------------- Lab Results  Component Value Date   WBC 8.7 11/12/2016   HGB 12.5 11/12/2016   HCT 38.4 11/12/2016   PLT 321 11/12/2016   GLUCOSE 290 (H) 11/12/2016   ALT 44 11/12/2016   AST 44 (H) 11/12/2016   NA 139 11/12/2016   K 3.8 11/12/2016   CL 101 11/12/2016   CREATININE 0.95 11/12/2016   BUN 8 11/12/2016   CO2 29 11/12/2016    --------------------------------------------------------------------------------------------------------------------- Dg C-arm 1-60 Min-no Report  Result Date: 07/19/2017 Fluoroscopy was utilized by the requesting physician.  No radiographic interpretation.     Assessment & Plan:   Audrey Chan was seen today for back pain.  Diagnoses and all orders for this visit:  Facet arthritis of lumbar region  Chronic bilateral low back pain with bilateral sciatica -     Lumbar Epidural Injection  DDD (degenerative disc disease), lumbar -     Lumbar Epidural Injection; Future  Sciatica of left side -     Lumbar Epidural Injection; Future  Other orders -     traMADol (ULTRAM) 50 MG tablet; Take 1 tablet (50 mg total) by mouth 3 (three) times daily. -     triamcinolone acetonide (KENALOG-40) injection 40 mg -     sodium chloride flush (NS) 0.9 % injection 10 mL -     ropivacaine (PF) 2 mg/mL (0.2%) (NAROPIN) injection 10 mL -     lidocaine (PF) (XYLOCAINE) 1 % injection 5 mL -     iopamidol (ISOVUE-M) 41 % intrathecal injection 20 mL        ----------------------------------------------------------------------------------------------------------------------  Problem List Items Addressed This Visit    None    Visit Diagnoses    Facet arthritis of lumbar region    -  Primary   Relevant Medications   traMADol (ULTRAM) 50 MG tablet   triamcinolone acetonide (KENALOG-40) injection 40 mg (Completed)   Chronic bilateral low back pain with  bilateral sciatica       Relevant Medications   traMADol (ULTRAM) 50 MG tablet   triamcinolone acetonide (KENALOG-40) injection 40 mg (Completed)   DDD (degenerative disc disease), lumbar       Relevant Medications   traMADol (ULTRAM) 50 MG tablet   triamcinolone acetonide (KENALOG-40) injection 40 mg (Completed)   Other Relevant Orders   Lumbar Epidural Injection   Sciatica of left side       Relevant Orders   Lumbar Epidural Injection        ----------------------------------------------------------------------------------------------------------------------  1. Chronic bilateral low back pain with bilateral sciatica We will proceed with a repeat epidural series at this point.  We will have  her first epidural today and have gone over the risks and benefits of the procedure with her in full detail and all questions were answered.  She is to return to clinic proximal 1 month for reevaluation possible repeat injection at that time - Lumbar Epidural Injection  2. Facet arthritis of lumbar region Tinea with core stretching strengthening exercises.  3. DDD (degenerative disc disease), lumbar As above.  We have also reviewed the [facility] practitioner database information and will keep her on her Ultram at this point. - Lumbar Epidural Injection; Future  4. Sciatica of left side As above - Lumbar Epidural Injection; Future    ----------------------------------------------------------------------------------------------------------------------  I am having Audrey Chan maintain her albuterol, fluticasone, lisinopril, hydrochlorothiazide, insulin glulisine, MULTI-VITAMINS, ferrous sulfate, metFORMIN, aspirin EC, atorvastatin, ranitidine, diclofenac sodium, cetirizine, Vitamin D3, polyethylene glycol powder, insulin glulisine, acetaminophen, rosuvastatin, ipratropium, fluconazole, gabapentin, IBU, and traMADol. We administered triamcinolone acetonide, sodium chloride flush,  ropivacaine (PF) 2 mg/mL (0.2%), lidocaine (PF), and iopamidol.   Meds ordered this encounter  Medications  . traMADol (ULTRAM) 50 MG tablet    Sig: Take 1 tablet (50 mg total) by mouth 3 (three) times daily.    Dispense:  90 tablet    Refill:  1  . triamcinolone acetonide (KENALOG-40) injection 40 mg  . sodium chloride flush (NS) 0.9 % injection 10 mL  . ropivacaine (PF) 2 mg/mL (0.2%) (NAROPIN) injection 10 mL  . lidocaine (PF) (XYLOCAINE) 1 % injection 5 mL  . iopamidol (ISOVUE-M) 41 % intrathecal injection 20 mL   Patient's Medications  New Prescriptions   No medications on file  Previous Medications   ACETAMINOPHEN (TYLENOL) 500 MG TABLET    Take 500 mg by mouth.   ALBUTEROL (PROVENTIL HFA;VENTOLIN HFA) 108 (90 BASE) MCG/ACT INHALER    Inhale 2 puffs into the lungs every 6 (six) hours as needed for wheezing or shortness of breath.    ASPIRIN EC 81 MG TABLET    Take 81 mg by mouth daily.   ATORVASTATIN (LIPITOR) 40 MG TABLET    Take 40 mg by mouth daily at 6 PM.    CETIRIZINE (ZYRTEC) 10 MG TABLET    Take by mouth daily.    CHOLECALCIFEROL (VITAMIN D3) 1000 UNITS CAPS    Take 1,000 Units by mouth daily.    DICLOFENAC SODIUM (VOLTAREN) 1 % GEL    Apply 2 g topically 4 (four) times daily.   FERROUS SULFATE 325 (65 FE) MG TABLET    Take 325 mg by mouth daily.   FLUCONAZOLE (DIFLUCAN) 150 MG TABLET       FLUTICASONE (FLONASE) 50 MCG/ACT NASAL SPRAY    Place 2 sprays into both nostrils daily as needed for allergies or rhinitis.    GABAPENTIN (NEURONTIN) 800 MG TABLET       HYDROCHLOROTHIAZIDE (HYDRODIURIL) 25 MG TABLET    Take 25 mg by mouth daily.   IBU 800 MG TABLET       INSULIN GLULISINE (APIDRA) 100 UNIT/ML INJECTION    Inject into the skin See admin instructions. Continuously via pump   INSULIN GLULISINE (APIDRA) 100 UNIT/ML INJECTION    Take up to 50 units daily in insulin pump, as directed   IPRATROPIUM (ATROVENT HFA) 17 MCG/ACT INHALER    Inhale into the lungs as needed.     LISINOPRIL (PRINIVIL,ZESTRIL) 10 MG TABLET    Take 10 mg by mouth daily.   METFORMIN (GLUCOPHAGE-XR) 500 MG 24 HR TABLET    Take 500 mg  by mouth 2 (two) times daily.   MULTIPLE VITAMIN (MULTI-VITAMINS) TABS    Take 1 tablet by mouth daily.   POLYETHYLENE GLYCOL POWDER (GLYCOLAX/MIRALAX) POWDER    0.5 Containers as needed.    RANITIDINE (ZANTAC) 150 MG TABLET    Take 150 mg by mouth daily.    ROSUVASTATIN (CRESTOR) 20 MG TABLET    20 mg daily.   Modified Medications   Modified Medication Previous Medication   TRAMADOL (ULTRAM) 50 MG TABLET traMADol (ULTRAM) 50 MG tablet      Take 1 tablet (50 mg total) by mouth 3 (three) times daily.    Take 1 tablet (50 mg total) by mouth 3 (three) times daily.  Discontinued Medications   No medications on file   ----------------------------------------------------------------------------------------------------------------------  Follow-up: Return for evaluation, procedure.  Seizure: L5-S1 epidural steroid under fluoroscopic guidance without sedation   Procedure: L5-S1 LESI with fluoroscopic guidance and moderate sedation  NOTE: The risks, benefits, and expectations of the procedure have been discussed and explained to the patient who was understanding and in agreement with suggested treatment plan. No guarantees were made.  DESCRIPTION OF PROCEDURE: Lumbar epidural steroid injection with no IV Versed, EKG, blood pressure, pulse, and pulse oximetry monitoring. The procedure was performed with the patient in the prone position under fluoroscopic guidance.  Sterile prep x3 was initiated and I then injected subcutaneous lidocaine to the overlying L5-S1 site after its fluoroscopic identifictation.  Using strict aseptic technique, I then advanced an 18-gauge Tuohy epidural needle in the midline using interlaminar approach via loss-of-resistance to saline technique. There was negative aspiration for heme or  CSF.  I then confirmed position with both AP and Lateral  fluoroscan.  2 cc of Isovue were injected and a  total of 5 mL of Preservative-Free normal saline mixed with 40 mg of Kenalog and 1cc Ropicaine 0.2 percent were injected incrementally via the  epidurally placed needle. The needle was removed. The patient tolerated the injection well and was convalesced and discharged to home in stable condition. Should the patient have any post procedure difficulty they have been instructed on how to contact us for assistance.   Molli Barrows, MD

## 2017-08-16 ENCOUNTER — Other Ambulatory Visit: Payer: Self-pay | Admitting: Specialist

## 2017-08-16 DIAGNOSIS — R918 Other nonspecific abnormal finding of lung field: Secondary | ICD-10-CM

## 2017-08-26 ENCOUNTER — Emergency Department
Admission: EM | Admit: 2017-08-26 | Discharge: 2017-08-26 | Disposition: A | Discharge status: Home / Self Care | Payer: Medicare PPO | Attending: Emergency Medicine | Admitting: Emergency Medicine

## 2017-08-26 ENCOUNTER — Encounter: Payer: Self-pay | Admitting: Emergency Medicine

## 2017-08-26 DIAGNOSIS — L298 Other pruritus: Secondary | ICD-10-CM | POA: Diagnosis not present

## 2017-08-26 DIAGNOSIS — Z79899 Other long term (current) drug therapy: Secondary | ICD-10-CM | POA: Insufficient documentation

## 2017-08-26 DIAGNOSIS — Z794 Long term (current) use of insulin: Secondary | ICD-10-CM | POA: Insufficient documentation

## 2017-08-26 DIAGNOSIS — I10 Essential (primary) hypertension: Secondary | ICD-10-CM | POA: Diagnosis not present

## 2017-08-26 DIAGNOSIS — E119 Type 2 diabetes mellitus without complications: Secondary | ICD-10-CM | POA: Insufficient documentation

## 2017-08-26 DIAGNOSIS — L299 Pruritus, unspecified: Secondary | ICD-10-CM

## 2017-08-26 DIAGNOSIS — Z7982 Long term (current) use of aspirin: Secondary | ICD-10-CM | POA: Insufficient documentation

## 2017-08-26 DIAGNOSIS — Z87891 Personal history of nicotine dependence: Secondary | ICD-10-CM | POA: Diagnosis not present

## 2017-08-26 MED ORDER — PREDNISONE 20 MG PO TABS
60.0000 mg | ORAL_TABLET | Freq: Once | ORAL | Status: AC
  Administered 2017-08-26: 60 mg via ORAL
  Filled 2017-08-26: qty 3

## 2017-08-26 MED ORDER — PREDNISONE 50 MG PO TABS: ORAL_TABLET | ORAL | 0 refills | Status: DC

## 2017-08-26 NOTE — ED Triage Notes (Signed)
Patient states that she has had bilateral arm itching times two weeks. Patient states that she was seen by here pcp about a week ago and told to use hydrocortisone cream. Patient reports no improvement.

## 2017-08-26 NOTE — ED Provider Notes (Signed)
[facility] Emergency Department Provider Note  ____________________________________________  Time seen: Approximately 10:01 PM  I have reviewed the triage vital signs and the nursing notes.   HISTORY  Chief Complaint Pruritis    HPI Audrey Chan is a 67 y.o. female presents to the emergency department with pruritus of the bilateral upper extremities for the past 2 weeks.  Patient reports that her symptoms started after wearing silver rings, which is atypical for her.  Patient reports that she presented her symptoms to her primary care provider approximately 1 week ago and he recommended 1% hydrocortisone.  Patient reports that she tried hydrocortisone as directed and her symptoms have not improved.  Patient is also tried Benadryl which makes her too sleepy to function at work.  She denies shortness of breath, chest tightness, chest pain, nausea, vomiting and syncope.  No other contacts in her home have pruritus.   Past Medical History:  Diagnosis Date  . Allergic rhinitis   . Cholelithiasis   . Diabetes type 2, controlled ([place])   . GERD (gastroesophageal reflux disease)   . Hypertension   . Neuropathy   . Renal insufficiency 01/20/2016    Patient Active Problem List   Diagnosis Date Noted  . Pulmonary nodules 01/28/2015  . Supraclavicular adenopathy 01/21/2015  . Abnormal MRI, lumbar spine 01/01/2015  . Weight loss, unintentional 01/01/2015  . Lymphocytosis 01/01/2015  . Microcytic red blood cells 01/01/2015  . Recurrent biliary colic 79/89/2119  . Calculus of gallbladder with acute on chronic cholecystitis without obstruction 12/22/2014  . Encounter for fitting or adjustment of insulin pump 08/09/2014  . Diabetic peripheral neuropathy associated with type 2 diabetes mellitus ([place]) 08/09/2014  . Hypertension 05/24/2012  . Diabetes mellitus ([place]) 05/24/2012  . Idiopathic localized osteoarthropathy 04/09/2012  . Dermatitis, eczematoid 02/27/2012   . Can't get food down 09/26/2011    Past Surgical History:  Procedure Laterality Date  . ABDOMINAL HYSTERECTOMY  1972  . CERVICAL FUSION    . CHOLECYSTECTOMY N/A 12/28/2014   Procedure: LAPAROSCOPIC CHOLECYSTECTOMY;  Surgeon: Sherri Rad, MD;  Location: [facility] ORS;  Service: General;  Laterality: N/A;    Prior to Admission medications   Medication Sig Start Date End Date Taking? Authorizing Provider  acetaminophen (TYLENOL) 500 MG tablet Take 500 mg by mouth. 05/17/12   [provider]  albuterol (PROVENTIL HFA;VENTOLIN HFA) 108 (90 BASE) MCG/ACT inhaler Inhale 2 puffs into the lungs every 6 (six) hours as needed for wheezing or shortness of breath.     [provider]  aspirin EC 81 MG tablet Take 81 mg by mouth daily.    [provider]  atorvastatin (LIPITOR) 40 MG tablet Take 40 mg by mouth daily at 6 PM.  11/16/14   [provider]  cetirizine (ZYRTEC) 10 MG tablet Take by mouth daily.     [provider]  Cholecalciferol (VITAMIN D3) 1000 units CAPS Take 1,000 Units by mouth daily.     [provider]  diclofenac sodium (VOLTAREN) 1 % GEL Apply 2 g topically 4 (four) times daily. 04/15/16   Laban Emperor, PA-C  ferrous sulfate 325 (65 FE) MG tablet Take 325 mg by mouth daily.    [provider]  fluconazole (DIFLUCAN) 150 MG tablet  02/14/17   [provider]  fluticasone (FLONASE) 50 MCG/ACT nasal spray Place 2 sprays into both nostrils daily as needed for allergies or rhinitis.     [provider]  gabapentin (NEURONTIN) 800 MG tablet  12/26/16   [provider]  hydrochlorothiazide (HYDRODIURIL) 25 MG tablet Take 25 mg by mouth daily.    [provider]  IBU 800 MG tablet  01/08/17   [provider]  insulin glulisine (APIDRA) 100 UNIT/ML injection Inject into the skin See admin instructions. Continuously via pump    [provider]  insulin glulisine (APIDRA) 100 UNIT/ML  injection Take up to 50 units daily in insulin pump, as directed 12/14/15   [provider]  ipratropium (ATROVENT HFA) 17 MCG/ACT inhaler Inhale into the lungs as needed.     [provider]  lisinopril (PRINIVIL,ZESTRIL) 10 MG tablet Take 10 mg by mouth daily.    [provider]  metFORMIN (GLUCOPHAGE-XR) 500 MG 24 hr tablet Take 500 mg by mouth 2 (two) times daily.    [provider]  Multiple Vitamin (MULTI-VITAMINS) TABS Take 1 tablet by mouth daily.    [provider]  polyethylene glycol powder (GLYCOLAX/MIRALAX) powder 0.5 Containers as needed.  10/08/15   [provider]  predniSONE (DELTASONE) 50 MG tablet Take one 50 mg tablet once daily for the next 4 days. 08/26/17   Lannie Fields, PA-C  ranitidine (ZANTAC) 150 MG tablet Take 150 mg by mouth daily.  05/03/15   [provider]  rosuvastatin (CRESTOR) 20 MG tablet 20 mg daily.  05/02/13   [provider]  traMADol (ULTRAM) 50 MG tablet Take 1 tablet (50 mg total) by mouth 3 (three) times daily. 07/19/17   Molli Barrows, MD    Allergies Patient has no known allergies.  Family History  Problem Relation Age of Onset  . Lung cancer Father     Social History Social History   Tobacco Use  . Smoking status: Former Smoker    Last attempt to quit: 02/21/1999    Years since quitting: 18.5  . Smokeless tobacco: Former Network engineer Use Topics  . Alcohol use: No    Alcohol/week: 0.0 oz  . Drug use: No     Review of Systems  Constitutional: No fever/chills Eyes: No visual changes. No discharge ENT: No upper respiratory complaints. Cardiovascular: no chest pain. Respiratory: no cough. No SOB. Gastrointestinal: No abdominal pain.  No nausea, no vomiting.  No diarrhea.  No constipation. Musculoskeletal: Negative for musculoskeletal pain. Skin: Patient has pruritus of upper extremities. Neurological: Negative for headaches, focal weakness or  numbness.  ____________________________________________   PHYSICAL EXAM:  VITAL SIGNS: ED Triage Vitals [08/26/17 2044]  Enc Vitals Group     BP 138/72     Pulse Rate 69     Resp 18     Temp 98.5 F (36.9 C)     Temp Source Oral     SpO2 96 %     Weight 145 lb (65.8 kg)     Height 5\' 6"  (1.676 m)     Head Circumference      Peak Flow      Pain Score 0     Pain Loc      Pain Edu?      Excl. in [place]?      Constitutional: Alert and oriented. Well appearing and in no acute distress. Eyes: Conjunctivae are normal. PERRL. EOMI. Head: Atraumatic. ENT:      Ears: TMs are pearly.       Nose: No congestion/rhinnorhea.      Mouth/Throat: Mucous membranes are moist.  Neck: No stridor.  No cervical spine tenderness to palpation. Hematological/Lymphatic/Immunilogical: No cervical  lymphadenopathy.  Cardiovascular: Normal rate, regular rhythm. Normal S1 and S2.  Good peripheral circulation. Respiratory: Normal respiratory effort without tachypnea or retractions. Lungs CTAB. Good air entry to the bases with no decreased or absent breath sounds. Gastrointestinal: Bowel sounds 4 quadrants. Soft and nontender to palpation. No guarding or rigidity. No palpable masses. No distention. No CVA tenderness. Musculoskeletal: Full range of motion to all extremities. No gross deformities appreciated. Neurologic:  Normal speech and language. No gross focal neurologic deficits are appreciated.  Skin: Signs of excoriation visualized on upper extremities.  No rash. Psychiatric: Mood and affect are normal. Speech and behavior are normal. Patient exhibits appropriate insight and judgement.   ____________________________________________   LABS (all labs ordered are listed, but only abnormal results are displayed)  Labs Reviewed - No data to display ____________________________________________  EKG   ____________________________________________  RADIOLOGY   No results  found.  ____________________________________________    PROCEDURES  Procedure(s) performed:    Procedures    Medications  predniSONE (DELTASONE) tablet 60 mg (has no administration in time range)     ____________________________________________   INITIAL IMPRESSION / ASSESSMENT AND PLAN / ED COURSE  Pertinent labs & imaging results that were available during my care of the patient were reviewed by me and considered in my medical decision making (see chart for details).  Review of the [place] CSRS was performed in accordance of the [place] prior to dispensing any controlled drugs.    Assessment and Plan:  Pruritus Presents to the emergency department with pruritus of the upper extremities for the past 2 weeks after wearing silver rings.  No evidence of urticaria or rashes visualized on physical exam.  Patient has failed conservative measures with topical hydrocortisone and Benadryl.  Patient was treated empirically with prednisone.  She was advised to follow-up with primary care as needed.  All patient questions were answered.    ____________________________________________  FINAL CLINICAL IMPRESSION(S) / ED DIAGNOSES  Final diagnoses:  Pruritus      NEW MEDICATIONS STARTED DURING THIS VISIT:  ED Discharge Orders        Ordered    predniSONE (DELTASONE) 50 MG tablet     08/26/17 2159          This chart was dictated using voice recognition software/Dragon. Despite best efforts to proofread, errors can occur which can change the meaning. Any change was purely unintentional.    Karren Cobble 08/26/17 2208    Carrie Mew, MD 08/26/17 2248

## 2017-08-26 NOTE — ED Notes (Addendum)
Pt reports itching to arms for 2 weeks; as been using OTC cream as directed by her provider with no relief; no respiratory issues

## 2017-09-04 ENCOUNTER — Ambulatory Visit: Payer: Medicare PPO | Admitting: Anesthesiology

## 2017-09-06 ENCOUNTER — Other Ambulatory Visit: Payer: Self-pay | Admitting: Anesthesiology

## 2017-09-06 ENCOUNTER — Ambulatory Visit (HOSPITAL_BASED_OUTPATIENT_CLINIC_OR_DEPARTMENT_OTHER): Payer: Medicare PPO | Admitting: Anesthesiology

## 2017-09-06 ENCOUNTER — Ambulatory Visit: Payer: Medicare PPO

## 2017-09-06 ENCOUNTER — Other Ambulatory Visit: Payer: Self-pay

## 2017-09-06 ENCOUNTER — Encounter: Payer: Self-pay | Admitting: Anesthesiology

## 2017-09-06 ENCOUNTER — Ambulatory Visit
Admission: RE | Admit: 2017-09-06 | Discharge: 2017-09-06 | Disposition: A | Discharge status: Home / Self Care | Payer: Medicare PPO | Source: Ambulatory Visit | Attending: Anesthesiology | Admitting: Anesthesiology

## 2017-09-06 VITALS — BP 131/76 | HR 89 | Temp 98.4°F | Resp 17 | Ht 66.0 in | Wt 143.0 lb

## 2017-09-06 DIAGNOSIS — M5442 Lumbago with sciatica, left side: Secondary | ICD-10-CM | POA: Diagnosis not present

## 2017-09-06 DIAGNOSIS — M5441 Lumbago with sciatica, right side: Secondary | ICD-10-CM

## 2017-09-06 DIAGNOSIS — G8929 Other chronic pain: Secondary | ICD-10-CM | POA: Insufficient documentation

## 2017-09-06 DIAGNOSIS — Z79899 Other long term (current) drug therapy: Secondary | ICD-10-CM | POA: Insufficient documentation

## 2017-09-06 DIAGNOSIS — M5136 Other intervertebral disc degeneration, lumbar region: Secondary | ICD-10-CM

## 2017-09-06 DIAGNOSIS — M25552 Pain in left hip: Secondary | ICD-10-CM | POA: Insufficient documentation

## 2017-09-06 DIAGNOSIS — M5116 Intervertebral disc disorders with radiculopathy, lumbar region: Secondary | ICD-10-CM | POA: Insufficient documentation

## 2017-09-06 DIAGNOSIS — Z7982 Long term (current) use of aspirin: Secondary | ICD-10-CM | POA: Diagnosis not present

## 2017-09-06 DIAGNOSIS — M47817 Spondylosis without myelopathy or radiculopathy, lumbosacral region: Secondary | ICD-10-CM

## 2017-09-06 DIAGNOSIS — R52 Pain, unspecified: Secondary | ICD-10-CM

## 2017-09-06 DIAGNOSIS — M5432 Sciatica, left side: Secondary | ICD-10-CM

## 2017-09-06 DIAGNOSIS — Z794 Long term (current) use of insulin: Secondary | ICD-10-CM | POA: Insufficient documentation

## 2017-09-06 MED ORDER — ROPIVACAINE HCL 2 MG/ML IJ SOLN
10.0000 mL | Freq: Once | INTRAMUSCULAR | Status: AC
  Administered 2017-09-06: 10 mL via EPIDURAL

## 2017-09-06 MED ORDER — TRAMADOL HCL 50 MG PO TABS: 50.0000 mg | ORAL_TABLET | Freq: Three times a day (TID) | ORAL | 1 refills | Status: DC

## 2017-09-06 MED ORDER — SODIUM CHLORIDE 0.9% FLUSH
10.0000 mL | Freq: Once | INTRAVENOUS | Status: AC
  Administered 2017-09-06: 10 mL

## 2017-09-06 MED ORDER — IOPAMIDOL (ISOVUE-M 200) INJECTION 41%
20.0000 mL | Freq: Once | INTRAMUSCULAR | Status: DC | PRN
  Administered 2017-09-06: 10 mL
  Filled 2017-09-06: qty 20

## 2017-09-06 MED ORDER — TRIAMCINOLONE ACETONIDE 40 MG/ML IJ SUSP
40.0000 mg | Freq: Once | INTRAMUSCULAR | Status: AC
  Administered 2017-09-06: 40 mg

## 2017-09-06 MED ORDER — LIDOCAINE HCL (PF) 1 % IJ SOLN
5.0000 mL | Freq: Once | INTRAMUSCULAR | Status: AC
  Administered 2017-09-06: 5 mL via SUBCUTANEOUS

## 2017-09-06 MED ORDER — ROPIVACAINE HCL 2 MG/ML IJ SOLN
INTRAMUSCULAR | Status: AC
  Filled 2017-09-06: qty 10

## 2017-09-06 NOTE — Progress Notes (Signed)
Nursing Pain Medication Assessment:  Safety precautions to be maintained throughout the outpatient stay will include: orient to surroundings, keep bed in low position, maintain call bell within reach at all times, provide assistance with transfer out of bed and ambulation.  Medication Inspection Compliance: Audrey Chan did not comply with our request to bring her pills to be counted. She was reminded that bringing the medication bottles, even when empty, is a requirement.  Medication: None brought in. Pill/Patch Count: None available to be counted. Bottle Appearance: No container available. Did not bring bottle(s) to appointment. Filled Date: N/A Last Medication intake:  Today

## 2017-09-07 ENCOUNTER — Telehealth: Payer: Self-pay | Admitting: *Deleted

## 2017-09-07 NOTE — Progress Notes (Signed)
Subjective:  Patient ID: Audrey Chan, female    DOB: 1950/04/08  Age: 67 y.o. MRN: [id number]  CC: Hip Pain (left)   Procedure: L5-S1 epidural steroid under fluoroscopic guidance with no sedation  HPI Audrey Chan presents for evaluation.  She was last seen in May and had an epidural at that point.  She reports a 75% reduction in her left lower extremity pain.  Her back pain is also improved.  She is more active and able to sleep better at night.  She has had these previously for her low back pain and leg pain and she responds well.  She presents today for second epidural injection.  She has continued to do her physical therapy stretching strengthening exercises and is using minimal opioid medication for her back pain.  Otherwise she is in her usual state of health.  Based on her narcotic assessment sheet she continues to derive functional improvement with her medications.  No side effects are noted  Outpatient Medications Prior to Visit  Medication Sig Dispense Refill  . acetaminophen (TYLENOL) 500 MG tablet Take 500 mg by mouth.    Marland Kitchen albuterol (PROVENTIL HFA;VENTOLIN HFA) 108 (90 BASE) MCG/ACT inhaler Inhale 2 puffs into the lungs every 6 (six) hours as needed for wheezing or shortness of breath.     Marland Kitchen aspirin EC 81 MG tablet Take 81 mg by mouth daily.    Marland Kitchen atorvastatin (LIPITOR) 40 MG tablet Take 40 mg by mouth daily at 6 PM.     . cetirizine (ZYRTEC) 10 MG tablet Take by mouth daily.     . Cholecalciferol (VITAMIN D3) 1000 units CAPS Take 1,000 Units by mouth daily.     . diclofenac sodium (VOLTAREN) 1 % GEL Apply 2 g topically 4 (four) times daily. 100 g 0  . ferrous sulfate 325 (65 FE) MG tablet Take 325 mg by mouth daily.    . fluticasone (FLONASE) 50 MCG/ACT nasal spray Place 2 sprays into both nostrils daily as needed for allergies or rhinitis.     Marland Kitchen gabapentin (NEURONTIN) 800 MG tablet     . hydrochlorothiazide (HYDRODIURIL) 25 MG tablet Take 25 mg by mouth daily.    .  IBU 800 MG tablet     . insulin glulisine (APIDRA) 100 UNIT/ML injection Inject into the skin See admin instructions. Continuously via pump    . insulin glulisine (APIDRA) 100 UNIT/ML injection Take up to 50 units daily in insulin pump, as directed    . ipratropium (ATROVENT HFA) 17 MCG/ACT inhaler Inhale into the lungs as needed.     Marland Kitchen lisinopril (PRINIVIL,ZESTRIL) 10 MG tablet Take 10 mg by mouth daily.    . metFORMIN (GLUCOPHAGE-XR) 500 MG 24 hr tablet Take 500 mg by mouth 2 (two) times daily.    . Multiple Vitamin (MULTI-VITAMINS) TABS Take 1 tablet by mouth daily.    . polyethylene glycol powder (GLYCOLAX/MIRALAX) powder 0.5 Containers as needed.     . ranitidine (ZANTAC) 150 MG tablet Take 150 mg by mouth daily.     . rosuvastatin (CRESTOR) 20 MG tablet 20 mg daily.     . traMADol (ULTRAM) 50 MG tablet Take 1 tablet (50 mg total) by mouth 3 (three) times daily. 90 tablet 1  . fluconazole (DIFLUCAN) 150 MG tablet     . predniSONE (DELTASONE) 50 MG tablet Take one 50 mg tablet once daily for the next 4 days. (Patient not taking: Reported on 09/06/2017) 4 tablet 0   No  facility-administered medications prior to visit.     Review of Systems CNS: No confusion or sedation Cardiac: No angina or palpitations GI: No abdominal pain or constipation Constitutional: No nausea vomiting fevers or chills  Objective:  BP 131/76   Pulse 89   Temp 98.4 F (36.9 C) (Oral)   Resp 17   Ht 5\' 6"  (1.676 m)   Wt 143 lb (64.9 kg)   SpO2 99%   BMI 23.08 kg/m    BP Readings from Last 3 Encounters:  09/06/17 131/76  08/26/17 (!) 122/58  07/19/17 (!) 149/72     Wt Readings from Last 3 Encounters:  09/06/17 143 lb (64.9 kg)  08/26/17 145 lb (65.8 kg)  07/19/17 145 lb 9.6 oz (66 kg)     Physical Exam Pt is alert and oriented PERRL EOMI HEART IS RRR no murmur or rub LCTA no wheezing or rales MUSCULOSKELETAL reveals some paraspinous muscle tenderness and a positive straight leg raise on the  left side but less severe pain is noted with that motion.  Negative on the right her muscle tone and bulk to the lower extremities is at baseline.  She is walking with a nonantalgic gait.  Labs  No results found for: HGBA1C Lab Results  Component Value Date   CREATININE 0.95 11/12/2016    -------------------------------------------------------------------------------------------------------------------- Lab Results  Component Value Date   WBC 8.7 11/12/2016   HGB 12.5 11/12/2016   HCT 38.4 11/12/2016   PLT 321 11/12/2016   GLUCOSE 290 (H) 11/12/2016   ALT 44 11/12/2016   AST 44 (H) 11/12/2016   NA 139 11/12/2016   K 3.8 11/12/2016   CL 101 11/12/2016   CREATININE 0.95 11/12/2016   BUN 8 11/12/2016   CO2 29 11/12/2016    --------------------------------------------------------------------------------------------------------------------- Dg C-arm 1-60 Min-no Report  Result Date: 09/06/2017 Fluoroscopy was utilized by the requesting physician.  No radiographic interpretation.     Assessment & Plan:   Audrey Chan was seen today for hip pain.  Diagnoses and all orders for this visit:  Chronic bilateral low back pain with bilateral sciatica -     Lumbar Epidural Injection; Future -     Lumbar Epidural Injection  DDD (degenerative disc disease), lumbar -     Lumbar Epidural Injection -     Lumbar Epidural Injection; Future -     Lumbar Epidural Injection  Sciatica of left side -     Lumbar Epidural Injection -     Lumbar Epidural Injection; Future -     Lumbar Epidural Injection  Lumbosacral spondylosis without myelopathy -     Lumbar Epidural Injection; Future -     Lumbar Epidural Injection  Other orders -     traMADol (ULTRAM) 50 MG tablet; Take 1 tablet (50 mg total) by mouth 3 (three) times daily. -     triamcinolone acetonide (KENALOG-40) injection 40 mg -     sodium chloride flush (NS) 0.9 % injection 10 mL -     ropivacaine (PF) 2 mg/mL (0.2%) (NAROPIN)  injection 10 mL -     lidocaine (PF) (XYLOCAINE) 1 % injection 5 mL -     iopamidol (ISOVUE-M) 41 % intrathecal injection 20 mL        ----------------------------------------------------------------------------------------------------------------------  Problem List Items Addressed This Visit    None    Visit Diagnoses    Chronic bilateral low back pain with bilateral sciatica    -  Primary   Relevant Medications   traMADol (ULTRAM) 50 MG  tablet   triamcinolone acetonide (KENALOG-40) injection 40 mg (Completed)   Other Relevant Orders   Lumbar Epidural Injection   DDD (degenerative disc disease), lumbar       Relevant Medications   traMADol (ULTRAM) 50 MG tablet   triamcinolone acetonide (KENALOG-40) injection 40 mg (Completed)   Other Relevant Orders   Lumbar Epidural Injection   Sciatica of left side       Relevant Orders   Lumbar Epidural Injection   Lumbosacral spondylosis without myelopathy       Relevant Medications   traMADol (ULTRAM) 50 MG tablet   triamcinolone acetonide (KENALOG-40) injection 40 mg (Completed)   Other Relevant Orders   Lumbar Epidural Injection        ----------------------------------------------------------------------------------------------------------------------  1. DDD (degenerative disc disease), lumbar We will proceed with a second epidural today in the series.  The risks and benefits are once again reviewed all questions answered.  We will have her return to clinic in 3 months for possible third epidural at that time.  In the meantime continue with physical therapy stretching strengthening as I reviewed some core exercises with her today. - Lumbar Epidural Injection - Lumbar Epidural Injection; Future - Lumbar Epidural Injection  2. Sciatica of left side As above - Lumbar Epidural Injection - Lumbar Epidural Injection; Future - Lumbar Epidural Injection  3. Chronic bilateral low back pain with bilateral sciatica As above -  Lumbar Epidural Injection; Future - Lumbar Epidural Injection  4. Lumbosacral spondylosis without myelopathy As above she is also to continue her tramadol.  We have gone over the [facility] practitioner database information and it is appropriate. - Lumbar Epidural Injection; Future - Lumbar Epidural Injection    ----------------------------------------------------------------------------------------------------------------------  I am having Audrey Chan maintain her albuterol, fluticasone, lisinopril, hydrochlorothiazide, insulin glulisine, MULTI-VITAMINS, ferrous sulfate, metFORMIN, aspirin EC, atorvastatin, ranitidine, diclofenac sodium, cetirizine, Vitamin D3, polyethylene glycol powder, insulin glulisine, acetaminophen, rosuvastatin, ipratropium, fluconazole, gabapentin, IBU, predniSONE, and traMADol. We administered triamcinolone acetonide, sodium chloride flush, ropivacaine (PF) 2 mg/mL (0.2%), lidocaine (PF), and iopamidol.   Meds ordered this encounter  Medications  . traMADol (ULTRAM) 50 MG tablet    Sig: Take 1 tablet (50 mg total) by mouth 3 (three) times daily.    Dispense:  90 tablet    Refill:  1  . triamcinolone acetonide (KENALOG-40) injection 40 mg  . sodium chloride flush (NS) 0.9 % injection 10 mL  . ropivacaine (PF) 2 mg/mL (0.2%) (NAROPIN) injection 10 mL  . lidocaine (PF) (XYLOCAINE) 1 % injection 5 mL  . iopamidol (ISOVUE-M) 41 % intrathecal injection 20 mL   Patient's Medications  New Prescriptions   No medications on file  Previous Medications   ACETAMINOPHEN (TYLENOL) 500 MG TABLET    Take 500 mg by mouth.   ALBUTEROL (PROVENTIL HFA;VENTOLIN HFA) 108 (90 BASE) MCG/ACT INHALER    Inhale 2 puffs into the lungs every 6 (six) hours as needed for wheezing or shortness of breath.    ASPIRIN EC 81 MG TABLET    Take 81 mg by mouth daily.   ATORVASTATIN (LIPITOR) 40 MG TABLET    Take 40 mg by mouth daily at 6 PM.    CETIRIZINE (ZYRTEC) 10 MG TABLET     Take by mouth daily.    CHOLECALCIFEROL (VITAMIN D3) 1000 UNITS CAPS    Take 1,000 Units by mouth daily.    DICLOFENAC SODIUM (VOLTAREN) 1 % GEL    Apply 2 g topically 4 (four) times daily.  FERROUS SULFATE 325 (65 FE) MG TABLET    Take 325 mg by mouth daily.   FLUCONAZOLE (DIFLUCAN) 150 MG TABLET       FLUTICASONE (FLONASE) 50 MCG/ACT NASAL SPRAY    Place 2 sprays into both nostrils daily as needed for allergies or rhinitis.    GABAPENTIN (NEURONTIN) 800 MG TABLET       HYDROCHLOROTHIAZIDE (HYDRODIURIL) 25 MG TABLET    Take 25 mg by mouth daily.   IBU 800 MG TABLET       INSULIN GLULISINE (APIDRA) 100 UNIT/ML INJECTION    Inject into the skin See admin instructions. Continuously via pump   INSULIN GLULISINE (APIDRA) 100 UNIT/ML INJECTION    Take up to 50 units daily in insulin pump, as directed   IPRATROPIUM (ATROVENT HFA) 17 MCG/ACT INHALER    Inhale into the lungs as needed.    LISINOPRIL (PRINIVIL,ZESTRIL) 10 MG TABLET    Take 10 mg by mouth daily.   METFORMIN (GLUCOPHAGE-XR) 500 MG 24 HR TABLET    Take 500 mg by mouth 2 (two) times daily.   MULTIPLE VITAMIN (MULTI-VITAMINS) TABS    Take 1 tablet by mouth daily.   POLYETHYLENE GLYCOL POWDER (GLYCOLAX/MIRALAX) POWDER    0.5 Containers as needed.    PREDNISONE (DELTASONE) 50 MG TABLET    Take one 50 mg tablet once daily for the next 4 days.   RANITIDINE (ZANTAC) 150 MG TABLET    Take 150 mg by mouth daily.    ROSUVASTATIN (CRESTOR) 20 MG TABLET    20 mg daily.   Modified Medications   Modified Medication Previous Medication   TRAMADOL (ULTRAM) 50 MG TABLET traMADol (ULTRAM) 50 MG tablet      Take 1 tablet (50 mg total) by mouth 3 (three) times daily.    Take 1 tablet (50 mg total) by mouth 3 (three) times daily.  Discontinued Medications   No medications on file   ----------------------------------------------------------------------------------------------------------------------  Follow-up: Return in about 3 months (around  12/07/2017) for evaluation, procedure.   Procedure: L5-S1 LESI with fluoroscopic guidance and no moderate sedation  NOTE: The risks, benefits, and expectations of the procedure have been discussed and explained to the patient who was understanding and in agreement with suggested treatment plan. No guarantees were made.  DESCRIPTION OF PROCEDURE: Lumbar epidural steroid injection with no IV Versed, EKG, blood pressure, pulse, and pulse oximetry monitoring. The procedure was performed with the patient in the prone position under fluoroscopic guidance.  Sterile prep x3 was initiated and I then injected subcutaneous lidocaine to the overlying 5 S1 site after its fluoroscopic identifictation.  Using strict aseptic technique, I then advanced an 18-gauge Tuohy epidural needle in the midline using interlaminar approach via loss-of-resistance to saline technique. There was negative aspiration for heme or  CSF.  I then confirmed position with both AP and Lateral fluoroscan.  2 cc of Isovue were injected and a  total of 5 mL of Preservative-Free normal saline mixed with 40 mg of Kenalog and 1cc Ropicaine 0.2 percent were injected incrementally via the  epidurally placed needle. The needle was removed. The patient tolerated the injection well and was convalesced and discharged to home in stable condition. Should the patient have any post procedure difficulty they have been instructed on how to contact us for assistance.    Molli Barrows, MD

## 2017-09-07 NOTE — Telephone Encounter (Signed)
Attempted to call for post procedure follow-up. Message left. 

## 2017-09-10 NOTE — Progress Notes (Signed)
Nursing Pain Medication Assessment:  Safety precautions to be maintained throughout the outpatient stay will include: orient to surroundings, keep bed in low position, maintain call bell within reach at all times, provide assistance with transfer out of bed and ambulation.  Medication Inspection Compliance: Pill count conducted under aseptic conditions, in front of the patient. Neither the pills nor the bottle was removed from the patient's sight at any time. Once count was completed pills were immediately returned to the patient in their original bottle.  Medication: Tramadol (Ultram) Pill/Patch Count: 10 of 90 pills remain Pill/Patch Appearance: Markings consistent with prescribed medication Bottle Appearance: Standard pharmacy container. Clearly labeled. Filled Date:05/23/ 2018 Last Medication intake:  Today

## 2017-09-13 ENCOUNTER — Ambulatory Visit
Admission: RE | Admit: 2017-09-13 | Discharge: 2017-09-13 | Disposition: A | Discharge status: Home / Self Care | Payer: Medicare PPO | Source: Ambulatory Visit | Attending: Specialist | Admitting: Specialist

## 2017-09-13 DIAGNOSIS — R918 Other nonspecific abnormal finding of lung field: Secondary | ICD-10-CM | POA: Diagnosis not present

## 2017-09-13 DIAGNOSIS — I700 Atherosclerosis of aorta: Secondary | ICD-10-CM | POA: Diagnosis not present

## 2017-09-13 DIAGNOSIS — J439 Emphysema, unspecified: Secondary | ICD-10-CM | POA: Diagnosis not present

## 2017-09-13 LAB — POCT I-STAT CREATININE: Creatinine, Ser: 1.1 mg/dL — ABNORMAL HIGH (ref 0.44–1.00)

## 2017-09-13 MED ORDER — IOPAMIDOL (ISOVUE-300) INJECTION 61%
75.0000 mL | Freq: Once | INTRAVENOUS | Status: AC | PRN
  Administered 2017-09-13: 75 mL via INTRAVENOUS

## 2017-09-26 ENCOUNTER — Ambulatory Visit: Payer: Medicare PPO | Admitting: Podiatry

## 2017-09-26 ENCOUNTER — Telehealth: Payer: Self-pay | Admitting: Anesthesiology

## 2017-09-26 ENCOUNTER — Encounter: Payer: Self-pay | Admitting: Podiatry

## 2017-09-26 DIAGNOSIS — L03032 Cellulitis of left toe: Secondary | ICD-10-CM

## 2017-09-26 MED ORDER — MUPIROCIN 2 % EX OINT: TOPICAL_OINTMENT | CUTANEOUS | 1 refills | Status: DC

## 2017-09-26 NOTE — Progress Notes (Signed)
She presents today status post possible ingrown toenail states that my sugars been a bit out of control but this toenails been red for a while and now it looks like a sort of drying up around the edges.  Objective: Vital signs are stable she is alert and oriented x3 she obviously has had a paronychia that is going on to resolve there is no erythema edema saline strange odor she has some dry skin around the nail folds.  The nail plate is not loose.  Assessment: Well-healing paronychia  Plan: At this point I am going to request we send over Bactroban ointment she will apply this should this turn red once again to notify us immediately.

## 2017-09-26 NOTE — Telephone Encounter (Signed)
Patient wants to speak with someone about her appt for procedure in Sept 5 at 3-15. She is diabetic and cannot go all day without eating. She is not getting sedation. Please call patient.

## 2017-09-26 NOTE — Telephone Encounter (Signed)
No solid food for three hours prior to a non-sedation procedure. Patient can eat up until 3pm.

## 2017-10-24 ENCOUNTER — Telehealth: Payer: Self-pay | Admitting: *Deleted

## 2017-10-24 ENCOUNTER — Telehealth: Payer: Self-pay

## 2017-10-24 NOTE — Telephone Encounter (Signed)
Pt called requesting RX for Tramadol from Dr Andree Elk

## 2017-10-24 NOTE — Telephone Encounter (Signed)
Given appointment on 09-06-17, with one refill. Script to last until 11-05-17.  Attempted to call patient, message left. Has appointment tomorrow.

## 2017-10-24 NOTE — Telephone Encounter (Signed)
Spoke with patient, she was not aware she had a refill. Will discuss at tomorrow's appointment.

## 2017-10-25 ENCOUNTER — Ambulatory Visit (HOSPITAL_BASED_OUTPATIENT_CLINIC_OR_DEPARTMENT_OTHER): Payer: Medicare PPO | Admitting: Anesthesiology

## 2017-10-25 ENCOUNTER — Ambulatory Visit
Admission: RE | Admit: 2017-10-25 | Discharge: 2017-10-25 | Disposition: A | Discharge status: Home / Self Care | Payer: Medicare PPO | Source: Ambulatory Visit | Attending: Anesthesiology | Admitting: Anesthesiology

## 2017-10-25 ENCOUNTER — Encounter: Payer: Self-pay | Admitting: Anesthesiology

## 2017-10-25 ENCOUNTER — Other Ambulatory Visit: Payer: Self-pay | Admitting: Anesthesiology

## 2017-10-25 VITALS — BP 140/94 | HR 90 | Temp 97.9°F | Resp 12 | Ht 66.0 in | Wt 140.0 lb

## 2017-10-25 DIAGNOSIS — R52 Pain, unspecified: Secondary | ICD-10-CM

## 2017-10-25 DIAGNOSIS — Z794 Long term (current) use of insulin: Secondary | ICD-10-CM | POA: Insufficient documentation

## 2017-10-25 DIAGNOSIS — M47817 Spondylosis without myelopathy or radiculopathy, lumbosacral region: Secondary | ICD-10-CM | POA: Insufficient documentation

## 2017-10-25 DIAGNOSIS — M5432 Sciatica, left side: Secondary | ICD-10-CM

## 2017-10-25 DIAGNOSIS — Z7982 Long term (current) use of aspirin: Secondary | ICD-10-CM | POA: Diagnosis not present

## 2017-10-25 DIAGNOSIS — M5442 Lumbago with sciatica, left side: Secondary | ICD-10-CM | POA: Insufficient documentation

## 2017-10-25 DIAGNOSIS — Z79899 Other long term (current) drug therapy: Secondary | ICD-10-CM | POA: Insufficient documentation

## 2017-10-25 DIAGNOSIS — M5441 Lumbago with sciatica, right side: Secondary | ICD-10-CM

## 2017-10-25 DIAGNOSIS — Z7951 Long term (current) use of inhaled steroids: Secondary | ICD-10-CM | POA: Diagnosis not present

## 2017-10-25 DIAGNOSIS — M5136 Other intervertebral disc degeneration, lumbar region: Secondary | ICD-10-CM | POA: Diagnosis not present

## 2017-10-25 DIAGNOSIS — G8929 Other chronic pain: Secondary | ICD-10-CM

## 2017-10-25 DIAGNOSIS — M47816 Spondylosis without myelopathy or radiculopathy, lumbar region: Secondary | ICD-10-CM

## 2017-10-25 MED ORDER — SODIUM CHLORIDE 0.9 % IJ SOLN
INTRAMUSCULAR | Status: AC
  Filled 2017-10-25: qty 10

## 2017-10-25 MED ORDER — LIDOCAINE HCL (PF) 1 % IJ SOLN
INTRAMUSCULAR | Status: AC
  Filled 2017-10-25: qty 5

## 2017-10-25 MED ORDER — IOPAMIDOL (ISOVUE-M 200) INJECTION 41%
20.0000 mL | Freq: Once | INTRAMUSCULAR | Status: DC | PRN
  Administered 2017-10-25: 10 mL
  Filled 2017-10-25: qty 20

## 2017-10-25 MED ORDER — ROPIVACAINE HCL 2 MG/ML IJ SOLN
10.0000 mL | Freq: Once | INTRAMUSCULAR | Status: AC
  Administered 2017-10-25: 10 mL via EPIDURAL

## 2017-10-25 MED ORDER — LIDOCAINE HCL (PF) 1 % IJ SOLN
5.0000 mL | Freq: Once | INTRAMUSCULAR | Status: AC
  Administered 2017-10-25: 5 mL via SUBCUTANEOUS

## 2017-10-25 MED ORDER — SODIUM CHLORIDE 0.9% FLUSH
10.0000 mL | Freq: Once | INTRAVENOUS | Status: AC
  Administered 2017-10-25: 10 mL

## 2017-10-25 MED ORDER — IOPAMIDOL (ISOVUE-M 200) INJECTION 41%
INTRAMUSCULAR | Status: AC
  Filled 2017-10-25: qty 10

## 2017-10-25 MED ORDER — TRIAMCINOLONE ACETONIDE 40 MG/ML IJ SUSP
INTRAMUSCULAR | Status: AC
  Filled 2017-10-25: qty 1

## 2017-10-25 MED ORDER — TRIAMCINOLONE ACETONIDE 40 MG/ML IJ SUSP
40.0000 mg | Freq: Once | INTRAMUSCULAR | Status: AC
  Administered 2017-10-25: 40 mg

## 2017-10-25 MED ORDER — ROPIVACAINE HCL 2 MG/ML IJ SOLN
INTRAMUSCULAR | Status: AC
  Filled 2017-10-25: qty 10

## 2017-10-25 NOTE — Progress Notes (Signed)
Safety precautions to be maintained throughout the outpatient stay will include: orient to surroundings, keep bed in low position, maintain call bell within reach at all times, provide assistance with transfer out of bed and ambulation.  

## 2017-10-25 NOTE — Patient Instructions (Signed)

## 2017-10-26 ENCOUNTER — Telehealth: Payer: Self-pay | Admitting: *Deleted

## 2017-10-26 NOTE — Telephone Encounter (Signed)
Attempted to call for post procedure follow-up. Message left. 

## 2017-10-29 NOTE — Progress Notes (Signed)
Subjective:  Patient ID: Audrey Chan, female    DOB: December 09, 1950  Age: 67 y.o. MRN: [id number]  CC: Back Pain (lower lumbar left is worse ) and Back Pain (no rectal pain, pain at base of spine, reports a knot there that is sore )   Procedure: L5-S1 epidural steroid under fluoroscopic guidance without moderate sedation  HPI Audrey Chan presents for reevaluation.  She was last seen in few months ago and has been doing well following her series of epidurals.  Her lower leg pain has improved by approximately 50 to 75% she is still having some back pain.  The nature of her back pain is stable and quality characteristic and distribution.  She has been doing some mild stretching exercises but nothing of a sustained recurrent strength training program.  No change in bowel or bladder function is noted she has been taking some tramadol and this also helps with her pain relief.  Otherwise she is in her usual state of health.  Outpatient Medications Prior to Visit  Medication Sig Dispense Refill  . acetaminophen (TYLENOL) 500 MG tablet Take 500 mg by mouth.    Marland Kitchen albuterol (PROVENTIL HFA;VENTOLIN HFA) 108 (90 BASE) MCG/ACT inhaler Inhale 2 puffs into the lungs every 6 (six) hours as needed for wheezing or shortness of breath.     Marland Kitchen aspirin EC 81 MG tablet Take 81 mg by mouth daily.    Marland Kitchen atorvastatin (LIPITOR) 40 MG tablet Take 40 mg by mouth daily at 6 PM.     . cetirizine (ZYRTEC) 10 MG tablet Take by mouth daily.     . Cholecalciferol (VITAMIN D3) 1000 units CAPS Take 1,000 Units by mouth daily.     . diclofenac sodium (VOLTAREN) 1 % GEL Apply 2 g topically 4 (four) times daily. 100 g 0  . ferrous sulfate 325 (65 FE) MG tablet Take 325 mg by mouth daily.    . fluconazole (DIFLUCAN) 150 MG tablet     . fluticasone (FLONASE) 50 MCG/ACT nasal spray Place 2 sprays into both nostrils daily as needed for allergies or rhinitis.     Marland Kitchen gabapentin (NEURONTIN) 800 MG tablet     . hydrochlorothiazide  (HYDRODIURIL) 25 MG tablet Take 25 mg by mouth daily.    . IBU 800 MG tablet     . insulin glulisine (APIDRA) 100 UNIT/ML injection Inject into the skin See admin instructions. Continuously via pump    . insulin glulisine (APIDRA) 100 UNIT/ML injection Take up to 50 units daily in insulin pump, as directed    . ipratropium (ATROVENT HFA) 17 MCG/ACT inhaler Inhale into the lungs as needed.     Marland Kitchen lisinopril (PRINIVIL,ZESTRIL) 10 MG tablet Take 10 mg by mouth daily.    . metFORMIN (GLUCOPHAGE-XR) 500 MG 24 hr tablet Take 500 mg by mouth 2 (two) times daily.    . Multiple Vitamin (MULTI-VITAMINS) TABS Take 1 tablet by mouth daily.    . mupirocin ointment (BACTROBAN) 2 % Apply to wound after soaking BID 30 g 1  . polyethylene glycol powder (GLYCOLAX/MIRALAX) powder 0.5 Containers as needed.     . ranitidine (ZANTAC) 150 MG tablet Take 150 mg by mouth daily.     . rosuvastatin (CRESTOR) 20 MG tablet 20 mg daily.     . traMADol (ULTRAM) 50 MG tablet Take 1 tablet (50 mg total) by mouth 3 (three) times daily. 90 tablet 1  . predniSONE (DELTASONE) 50 MG tablet Take one 50 mg tablet  once daily for the next 4 days. (Patient not taking: Reported on 09/06/2017) 4 tablet 0   No facility-administered medications prior to visit.     Review of Systems CNS: No confusion or sedation Cardiac: No angina or palpitations GI: No abdominal pain or constipation Constitutional: No nausea vomiting fevers or chills  Objective:  BP (!) 140/94   Pulse 90   Temp 97.9 F (36.6 C) (Oral)   Resp 12   Ht 5\' 6"  (1.676 m)   Wt 140 lb (63.5 kg)   SpO2 98%   BMI 22.60 kg/m    BP Readings from Last 3 Encounters:  10/25/17 (!) 140/94  09/06/17 131/76  08/26/17 (!) 122/58     Wt Readings from Last 3 Encounters:  10/25/17 140 lb (63.5 kg)  09/06/17 143 lb (64.9 kg)  08/26/17 145 lb (65.8 kg)     Physical Exam Pt is alert and oriented PERRL EOMI HEART IS RRR no murmur or rub LCTA no wheezing or  rales MUSCULOSKELETAL reveals some paraspinous muscle tenderness but no overt trigger points.  Her muscle tone and bulk to the lower extremities is at baseline and she is ambulating at baseline  Labs  No results found for: HGBA1C Lab Results  Component Value Date   CREATININE 1.10 (H) 09/13/2017    -------------------------------------------------------------------------------------------------------------------- Lab Results  Component Value Date   WBC 8.7 11/12/2016   HGB 12.5 11/12/2016   HCT 38.4 11/12/2016   PLT 321 11/12/2016   GLUCOSE 290 (H) 11/12/2016   ALT 44 11/12/2016   AST 44 (H) 11/12/2016   NA 139 11/12/2016   K 3.8 11/12/2016   CL 101 11/12/2016   CREATININE 1.10 (H) 09/13/2017   BUN 8 11/12/2016   CO2 29 11/12/2016    --------------------------------------------------------------------------------------------------------------------- Dg C-arm 1-60 Min-no Report  Result Date: 10/25/2017 Fluoroscopy was utilized by the requesting physician.  No radiographic interpretation.     Assessment & Plan:   Audrey Chan was seen today for back pain and back pain.  Diagnoses and all orders for this visit:  DDD (degenerative disc disease), lumbar -     Lumbar Epidural Injection; Future  Sciatica of left side -     Lumbar Epidural Injection; Future  Chronic bilateral low back pain with bilateral sciatica  Lumbosacral spondylosis without myelopathy -     Lumbar Epidural Injection; Future  Facet arthritis of lumbar region  Other orders -     triamcinolone acetonide (KENALOG-40) injection 40 mg -     sodium chloride flush (NS) 0.9 % injection 10 mL -     ropivacaine (PF) 2 mg/mL (0.2%) (NAROPIN) injection 10 mL -     lidocaine (PF) (XYLOCAINE) 1 % injection 5 mL -     iopamidol (ISOVUE-M) 41 % intrathecal injection 20 mL        ----------------------------------------------------------------------------------------------------------------------  Problem  List Items Addressed This Visit    None    Visit Diagnoses    DDD (degenerative disc disease), lumbar    -  Primary   Relevant Medications   triamcinolone acetonide (KENALOG-40) injection 40 mg (Completed)   Other Relevant Orders   Lumbar Epidural Injection   Sciatica of left side       Relevant Orders   Lumbar Epidural Injection   Chronic bilateral low back pain with bilateral sciatica       Relevant Medications   triamcinolone acetonide (KENALOG-40) injection 40 mg (Completed)   Lumbosacral spondylosis without myelopathy       Relevant Medications  triamcinolone acetonide (KENALOG-40) injection 40 mg (Completed)   Other Relevant Orders   Lumbar Epidural Injection   Facet arthritis of lumbar region       Relevant Medications   triamcinolone acetonide (KENALOG-40) injection 40 mg (Completed)        ----------------------------------------------------------------------------------------------------------------------  1. DDD (degenerative disc disease), lumbar We will proceed with an L5-S1 epidural today.  We will have her return to clinic in approximately 2 to 3 months for valuation.  I talked to her about initiating a formal recurrent core stretching strengthening program.  I talked to her about some exercises as well.  We have gone over the risks and benefits of the procedure once again in full detail and all questions have been answered. - Lumbar Epidural Injection; Future  2. Sciatica of left side As above - Lumbar Epidural Injection; Future  3. Chronic bilateral low back pain with bilateral sciatica As above  4. Lumbosacral spondylosis without myelopathy As above - Lumbar Epidural Injection; Future  5. Facet arthritis of lumbar region Continue core stretching strengthening and continue Ultram for her medication management.    ----------------------------------------------------------------------------------------------------------------------  I am having  Audrey Chan maintain her albuterol, fluticasone, lisinopril, hydrochlorothiazide, insulin glulisine, MULTI-VITAMINS, ferrous sulfate, metFORMIN, aspirin EC, atorvastatin, ranitidine, diclofenac sodium, cetirizine, Vitamin D3, polyethylene glycol powder, insulin glulisine, acetaminophen, rosuvastatin, ipratropium, fluconazole, gabapentin, IBU, predniSONE, traMADol, and mupirocin ointment. We administered triamcinolone acetonide, sodium chloride flush, ropivacaine (PF) 2 mg/mL (0.2%), lidocaine (PF), and iopamidol.   Meds ordered this encounter  Medications  . triamcinolone acetonide (KENALOG-40) injection 40 mg  . sodium chloride flush (NS) 0.9 % injection 10 mL  . ropivacaine (PF) 2 mg/mL (0.2%) (NAROPIN) injection 10 mL  . lidocaine (PF) (XYLOCAINE) 1 % injection 5 mL  . iopamidol (ISOVUE-M) 41 % intrathecal injection 20 mL   Patient's Medications  New Prescriptions   No medications on file  Previous Medications   ACETAMINOPHEN (TYLENOL) 500 MG TABLET    Take 500 mg by mouth.   ALBUTEROL (PROVENTIL HFA;VENTOLIN HFA) 108 (90 BASE) MCG/ACT INHALER    Inhale 2 puffs into the lungs every 6 (six) hours as needed for wheezing or shortness of breath.    ASPIRIN EC 81 MG TABLET    Take 81 mg by mouth daily.   ATORVASTATIN (LIPITOR) 40 MG TABLET    Take 40 mg by mouth daily at 6 PM.    CETIRIZINE (ZYRTEC) 10 MG TABLET    Take by mouth daily.    CHOLECALCIFEROL (VITAMIN D3) 1000 UNITS CAPS    Take 1,000 Units by mouth daily.    DICLOFENAC SODIUM (VOLTAREN) 1 % GEL    Apply 2 g topically 4 (four) times daily.   FERROUS SULFATE 325 (65 FE) MG TABLET    Take 325 mg by mouth daily.   FLUCONAZOLE (DIFLUCAN) 150 MG TABLET       FLUTICASONE (FLONASE) 50 MCG/ACT NASAL SPRAY    Place 2 sprays into both nostrils daily as needed for allergies or rhinitis.    GABAPENTIN (NEURONTIN) 800 MG TABLET       HYDROCHLOROTHIAZIDE (HYDRODIURIL) 25 MG TABLET    Take 25 mg by mouth daily.   IBU 800 MG TABLET        INSULIN GLULISINE (APIDRA) 100 UNIT/ML INJECTION    Inject into the skin See admin instructions. Continuously via pump   INSULIN GLULISINE (APIDRA) 100 UNIT/ML INJECTION    Take up to 50 units daily in insulin pump, as directed  IPRATROPIUM (ATROVENT HFA) 17 MCG/ACT INHALER    Inhale into the lungs as needed.    LISINOPRIL (PRINIVIL,ZESTRIL) 10 MG TABLET    Take 10 mg by mouth daily.   METFORMIN (GLUCOPHAGE-XR) 500 MG 24 HR TABLET    Take 500 mg by mouth 2 (two) times daily.   MULTIPLE VITAMIN (MULTI-VITAMINS) TABS    Take 1 tablet by mouth daily.   MUPIROCIN OINTMENT (BACTROBAN) 2 %    Apply to wound after soaking BID   POLYETHYLENE GLYCOL POWDER (GLYCOLAX/MIRALAX) POWDER    0.5 Containers as needed.    PREDNISONE (DELTASONE) 50 MG TABLET    Take one 50 mg tablet once daily for the next 4 days.   RANITIDINE (ZANTAC) 150 MG TABLET    Take 150 mg by mouth daily.    ROSUVASTATIN (CRESTOR) 20 MG TABLET    20 mg daily.    TRAMADOL (ULTRAM) 50 MG TABLET    Take 1 tablet (50 mg total) by mouth 3 (three) times daily.  Modified Medications   No medications on file  Discontinued Medications   No medications on file   ----------------------------------------------------------------------------------------------------------------------  Follow-up: Return in about 3 months (around 01/24/2018) for evaluation, med refill.   Procedure: L5-S1 LESI with fluoroscopic guidance and no moderate sedation  NOTE: The risks, benefits, and expectations of the procedure have been discussed and explained to the patient who was understanding and in agreement with suggested treatment plan. No guarantees were made.  DESCRIPTION OF PROCEDURE: Lumbar epidural steroid injection with no IV Versed, EKG, blood pressure, pulse, and pulse oximetry monitoring. The procedure was performed with the patient in the prone position under fluoroscopic guidance.  Sterile prep x3 was initiated and I then injected subcutaneous lidocaine to  the overlying L5-S1 site after its fluoroscopic identifictation.  Using strict aseptic technique, I then advanced an 18-gauge Tuohy epidural needle in the midline using interlaminar approach via loss-of-resistance to saline technique. There was negative aspiration for heme or  CSF.  I then confirmed position with both AP and Lateral fluoroscan.  2 cc of Isovue were injected and a  total of 5 mL of Preservative-Free normal saline mixed with 40 mg of Kenalog and 1cc Ropicaine 0.2 percent were injected incrementally via the  epidurally placed needle. The needle was removed. The patient tolerated the injection well and was convalesced and discharged to home in stable condition. Should the patient have any post procedure difficulty they have been instructed on how to contact us for assistance.    Molli Barrows, MD

## 2018-02-14 ENCOUNTER — Encounter (INDEPENDENT_AMBULATORY_CARE_PROVIDER_SITE_OTHER): Payer: Self-pay

## 2018-02-14 ENCOUNTER — Ambulatory Visit: Payer: Medicare PPO | Attending: Anesthesiology | Admitting: Anesthesiology

## 2018-02-14 ENCOUNTER — Encounter: Payer: Self-pay | Admitting: Anesthesiology

## 2018-02-14 VITALS — BP 150/79 | HR 85 | Temp 97.9°F | Resp 16 | Ht 67.0 in | Wt 137.7 lb

## 2018-02-14 DIAGNOSIS — M5431 Sciatica, right side: Secondary | ICD-10-CM | POA: Diagnosis not present

## 2018-02-14 DIAGNOSIS — Z794 Long term (current) use of insulin: Secondary | ICD-10-CM | POA: Diagnosis not present

## 2018-02-14 DIAGNOSIS — Z7951 Long term (current) use of inhaled steroids: Secondary | ICD-10-CM | POA: Insufficient documentation

## 2018-02-14 DIAGNOSIS — Z79899 Other long term (current) drug therapy: Secondary | ICD-10-CM | POA: Diagnosis not present

## 2018-02-14 DIAGNOSIS — M5386 Other specified dorsopathies, lumbar region: Secondary | ICD-10-CM

## 2018-02-14 DIAGNOSIS — M47816 Spondylosis without myelopathy or radiculopathy, lumbar region: Secondary | ICD-10-CM | POA: Diagnosis not present

## 2018-02-14 DIAGNOSIS — M5136 Other intervertebral disc degeneration, lumbar region: Secondary | ICD-10-CM | POA: Diagnosis not present

## 2018-02-14 DIAGNOSIS — Z7982 Long term (current) use of aspirin: Secondary | ICD-10-CM | POA: Insufficient documentation

## 2018-02-14 DIAGNOSIS — M545 Low back pain: Secondary | ICD-10-CM | POA: Diagnosis present

## 2018-02-14 DIAGNOSIS — M25551 Pain in right hip: Secondary | ICD-10-CM | POA: Diagnosis present

## 2018-02-14 NOTE — Patient Instructions (Signed)
____________________________________________________________________________________________  Preparing for Procedure with Sedation  Instructions: . Oral Intake: Do not eat or drink anything for at least 8 hours prior to your procedure. . Transportation: Public transportation is not allowed. Bring an adult driver. The driver must be physically present in our waiting room before any procedure can be started. . Physical Assistance: Bring an adult physically capable of assisting you, in the event you need help. This adult should keep you company at home for at least 6 hours after the procedure. . Blood Pressure Medicine: Take your blood pressure medicine with a sip of water the morning of the procedure. . Blood thinners: Notify our staff if you are taking any blood thinners. Depending on which one you take, there will be specific instructions on how and when to stop it. . Diabetics on insulin: Notify the staff so that you can be scheduled 1st case in the morning. If your diabetes requires high dose insulin, take only  of your normal insulin dose the morning of the procedure and notify the staff that you have done so. . Preventing infections: Shower with an antibacterial soap the morning of your procedure. . Build-up your immune system: Take 1000 mg of Vitamin C with every meal (3 times a day) the day prior to your procedure. . Antibiotics: Inform the staff if you have a condition or reason that requires you to take antibiotics before dental procedures. . Pregnancy: If you are pregnant, call and cancel the procedure. . Sickness: If you have a cold, fever, or any active infections, call and cancel the procedure. . Arrival: You must be in the facility at least 30 minutes prior to your scheduled procedure. . Children: Do not bring children with you. . Dress appropriately: Bring dark clothing that you would not mind if they get stained. . Valuables: Do not bring any jewelry or valuables.  Procedure  appointments are reserved for interventional treatments only. . No Prescription Refills. . No medication changes will be discussed during procedure appointments. . No disability issues will be discussed.  Reasons to call and reschedule or cancel your procedure: (Following these recommendations will minimize the risk of a serious complication.) . Surgeries: Avoid having procedures within 2 weeks of any surgery. (Avoid for 2 weeks before or after any surgery). . Flu Shots: Avoid having procedures within 2 weeks of a flu shots or . (Avoid for 2 weeks before or after immunizations). . Barium: Avoid having a procedure within 7-10 days after having had a radiological study involving the use of radiological contrast. (Myelograms, Barium swallow or enema study). . Heart attacks: Avoid any elective procedures or surgeries for the initial 6 months after a "Myocardial Infarction" (Heart Attack). . Blood thinners: It is imperative that you stop these medications before procedures. Let us know if you if you take any blood thinner.  . Infection: Avoid procedures during or within two weeks of an infection (including chest colds or gastrointestinal problems). Symptoms associated with infections include: Localized redness, fever, chills, night sweats or profuse sweating, burning sensation when voiding, cough, congestion, stuffiness, runny nose, sore throat, diarrhea, nausea, vomiting, cold or Flu symptoms, recent or current infections. It is specially important if the infection is over the area that we intend to treat. . Heart and lung problems: Symptoms that may suggest an active cardiopulmonary problem include: cough, chest pain, breathing difficulties or shortness of breath, dizziness, ankle swelling, uncontrolled high or unusually low blood pressure, and/or palpitations. If you are experiencing any of these symptoms, cancel   your procedure and contact your primary care physician for an evaluation.  Remember:    Regular Business hours are:  Monday to Thursday 8:00 AM to 4:00 PM  Provider's Schedule: Milinda Pointer, MD:  Procedure days: Tuesday and Thursday 7:30 AM to 4:00 PM  Gillis Santa, MD:  Procedure days: Monday and Wednesday 7:30 AM to 4:00 PM ____________________________________________________________________________________________   Epidural Steroid Injection An epidural steroid injection is a shot of steroid medicine and numbing medicine that is given into the space between the spinal cord and the bones in your back (epidural space). The shot helps relieve pain caused by an irritated or swollen nerve root. The amount of pain relief you get from the injection depends on what is causing the nerve to be swollen and irritated, and how long your pain lasts. You are more likely to benefit from this injection if your pain is strong and comes on suddenly rather than if you have had pain for a long time. Tell a health care provider about:   Any allergies you have.  All medicines you are taking, including vitamins, herbs, eye drops, creams, and over-the-counter medicines.  Any problems you or family members have had with anesthetic medicines.  Any blood disorders you have.  Any surgeries you have had.  Any medical conditions you have.  Whether you are pregnant or may be pregnant. What are the risks? Generally, this is a safe procedure. However, problems may occur, including:  Headache.  Bleeding.  Infection.  Allergic reaction to medicines.  Damage to your nerves. What happens before the procedure? Staying hydrated Follow instructions from your health care provider about hydration, which may include:  Up to 2 hours before the procedure - you may continue to drink clear liquids, such as water, clear fruit juice, black coffee, and plain tea. Eating and drinking restrictions Follow instructions from your health care provider about eating and drinking, which may  include:  8 hours before the procedure - stop eating heavy meals or foods such as meat, fried foods, or fatty foods.  6 hours before the procedure - stop eating light meals or foods, such as toast or cereal.  6 hours before the procedure - stop drinking milk or drinks that contain milk.  2 hours before the procedure - stop drinking clear liquids. Medicine  You may be given medicines to lower anxiety.  Ask your health care provider about: ? Changing or stopping your regular medicines. This is especially important if you are taking diabetes medicines or blood thinners. ? Taking medicines such as aspirin and ibuprofen. These medicines can thin your blood. Do not take these medicines before your procedure if your health care provider instructs you not to. General instructions  Plan to have someone take you home from the hospital or clinic. What happens during the procedure?  You may receive a medicine to help you relax (sedative).  You will be asked to lie on your abdomen.  The injection site will be cleaned.  A numbing medicine (local anesthetic) will be used to numb the injection site.  A needle will be inserted through your skin into the epidural space. You may feel some discomfort when this happens. An X-ray machine will be used to make sure the needle is put as close as possible to the affected nerve.  A steroid medicine and a local anesthetic will be injected into the epidural space.  The needle will be removed.  A bandage (dressing) will be put over the injection site. What  happens after the procedure?  Your blood pressure, heart rate, breathing rate, and blood oxygen level will be monitored until the medicines you were given have worn off.  Your arm or leg may feel weak or numb for a few hours.  The injection site may feel sore.  Do not drive for 24 hours if you received a sedative. This information is not intended to replace advice given to you by your health care  provider. Make sure you discuss any questions you have with your health care provider. Document Released: 05/16/2007 Document Revised: 10/25/2016 Document Reviewed: 05/25/2015 Elsevier Interactive Patient Education  2019 [employer].

## 2018-02-14 NOTE — Progress Notes (Signed)
Subjective:  Patient ID: Audrey Chan, female    DOB: December 26, 1950  Age: 67 y.o. MRN: [id number]  CC: Hip Pain (right ) and Back Pain (lumbar right is worse )   Procedure: None  HPI MAKYNLIE ROSSINI presents for reevaluation.  She was last seen a few months ago and had an epidural at that time.  At this point she is complaining more of right posterior lateral leg pain.  The epidural helped essentially eliminated her left lower extremity pain.  The pain is under good control at this point but she is had more pain radiating to the right calf and right lower leg.  I have reviewed her 2017 MRI findings with her.  Currently she is taking tramadol twice a day and 800 mg of Neurontin 3 times a day.  This regimen keeps her pain under decent control but she is questioning whether she may be other reduce her Neurontin which she uses for primarily peripheral neuropathy symptoms.  No change in the quality characteristic and distribution of her pain are otherwise noted.  Her bowel bladder function is been stable and lower extremity strength is been good.  Outpatient Medications Prior to Visit  Medication Sig Dispense Refill  . acetaminophen (TYLENOL) 500 MG tablet Take 500 mg by mouth.    Marland Kitchen albuterol (PROVENTIL HFA;VENTOLIN HFA) 108 (90 BASE) MCG/ACT inhaler Inhale 2 puffs into the lungs every 6 (six) hours as needed for wheezing or shortness of breath.     Marland Kitchen aspirin EC 81 MG tablet Take 81 mg by mouth daily.    Marland Kitchen atorvastatin (LIPITOR) 40 MG tablet Take 40 mg by mouth daily at 6 PM.     . cetirizine (ZYRTEC) 10 MG tablet Take by mouth daily.     . Cholecalciferol (VITAMIN D3) 1000 units CAPS Take 1,000 Units by mouth daily.     . diclofenac sodium (VOLTAREN) 1 % GEL Apply 2 g topically 4 (four) times daily. 100 g 0  . ferrous sulfate 325 (65 FE) MG tablet Take 325 mg by mouth daily.    . fluconazole (DIFLUCAN) 150 MG tablet     . fluticasone (FLONASE) 50 MCG/ACT nasal spray Place 2 sprays into both  nostrils daily as needed for allergies or rhinitis.     Marland Kitchen gabapentin (NEURONTIN) 800 MG tablet Take 800 mg by mouth 3 (three) times daily.     . hydrochlorothiazide (HYDRODIURIL) 25 MG tablet Take 25 mg by mouth daily.    . insulin glulisine (APIDRA) 100 UNIT/ML injection Inject into the skin See admin instructions. Continuously via pump    . insulin glulisine (APIDRA) 100 UNIT/ML injection Take up to 50 units daily in insulin pump, as directed    . ipratropium (ATROVENT HFA) 17 MCG/ACT inhaler Inhale into the lungs as needed.     Marland Kitchen lisinopril (PRINIVIL,ZESTRIL) 10 MG tablet Take 10 mg by mouth daily.    . metFORMIN (GLUCOPHAGE-XR) 500 MG 24 hr tablet Take 500 mg by mouth 2 (two) times daily.    . methocarbamol (ROBAXIN) 500 MG tablet Take 500 mg by mouth 2 (two) times daily.    . mometasone (ELOCON) 0.1 % cream Apply 1 application topically 2 (two) times daily as needed.    . Multiple Vitamin (MULTI-VITAMINS) TABS Take 1 tablet by mouth daily.    . mupirocin ointment (BACTROBAN) 2 % Apply to wound after soaking BID 30 g 1  . polyethylene glycol powder (GLYCOLAX/MIRALAX) powder 0.5 Containers as needed.     Marland Kitchen  ranitidine (ZANTAC) 150 MG tablet Take 150 mg by mouth daily.     . rosuvastatin (CRESTOR) 20 MG tablet 20 mg daily.     . traMADol (ULTRAM) 50 MG tablet Take 1 tablet (50 mg total) by mouth 3 (three) times daily. 90 tablet 1  . IBU 800 MG tablet     . predniSONE (DELTASONE) 50 MG tablet Take one 50 mg tablet once daily for the next 4 days. (Patient not taking: Reported on 09/06/2017) 4 tablet 0   No facility-administered medications prior to visit.     Review of Systems CNS: No confusion or sedation Cardiac: No angina or palpitations GI: No abdominal pain or constipation Constitutional: No nausea vomiting fevers or chills  Objective:  BP (!) 150/79 (BP Location: Right Arm, Patient Position: Sitting, Cuff Size: Normal)   Pulse 85   Temp 97.9 F (36.6 C) (Oral)   Resp 16   Ht 5'  7" (1.702 m)   Wt 137 lb 11.2 oz (62.5 kg)   SpO2 100%   BMI 21.57 kg/m    BP Readings from Last 3 Encounters:  02/14/18 (!) 150/79  10/25/17 (!) 140/94  09/06/17 131/76     Wt Readings from Last 3 Encounters:  02/14/18 137 lb 11.2 oz (62.5 kg)  10/25/17 140 lb (63.5 kg)  09/06/17 143 lb (64.9 kg)     Physical Exam Pt is alert and oriented PERRL EOMI HEART IS RRR no murmur or rub LCTA no wheezing or rales MUSCULOSKELETAL reveals some paraspinous muscle tenderness but no overt trigger points.  She does get occasional pain overlying the right posterior superior iliac crest which is present today.  This is not consistent with the current trigger point but the pain is less intense today she reports.  She does have a positive straight leg raise on the right side negative on the left with a mildly antalgic gait.  Labs  No results found for: HGBA1C Lab Results  Component Value Date   CREATININE 1.10 (H) 09/13/2017    -------------------------------------------------------------------------------------------------------------------- Lab Results  Component Value Date   WBC 8.7 11/12/2016   HGB 12.5 11/12/2016   HCT 38.4 11/12/2016   PLT 321 11/12/2016   GLUCOSE 290 (H) 11/12/2016   ALT 44 11/12/2016   AST 44 (H) 11/12/2016   NA 139 11/12/2016   K 3.8 11/12/2016   CL 101 11/12/2016   CREATININE 1.10 (H) 09/13/2017   BUN 8 11/12/2016   CO2 29 11/12/2016    --------------------------------------------------------------------------------------------------------------------- Dg C-arm 1-60 Min-no Report  Result Date: 10/25/2017 Fluoroscopy was utilized by the requesting physician.  No radiographic interpretation.     Assessment & Plan:   Zoriah was seen today for hip pain and back pain.  Diagnoses and all orders for this visit:  DDD (degenerative disc disease), lumbar -     Lumbar Epidural Injection; Future  Sciatica of right side associated with disorder of  lumbar spine -     Lumbar Epidural Injection; Future  Facet arthritis of lumbar region        ----------------------------------------------------------------------------------------------------------------------  Problem List Items Addressed This Visit    None    Visit Diagnoses    DDD (degenerative disc disease), lumbar    -  Primary   Relevant Medications   methocarbamol (ROBAXIN) 500 MG tablet   Other Relevant Orders   Lumbar Epidural Injection   Sciatica of right side associated with disorder of lumbar spine       Relevant Medications   methocarbamol (ROBAXIN)  500 MG tablet   Other Relevant Orders   Lumbar Epidural Injection   Facet arthritis of lumbar region       Relevant Medications   methocarbamol (ROBAXIN) 500 MG tablet        ----------------------------------------------------------------------------------------------------------------------  1. DDD (degenerative disc disease), lumbar We will schedule her for repeat epidural at her next visit for her right side sciatica L5 distribution.  We have gone over the risks and benefits of the procedure with her in full detail.  She is responded well to these in the past unfortunately her pain has remained quite recalcitrant and failed conservative therapy with medication management alone or physical therapy. - Lumbar Epidural Injection; Future  2. Sciatica of right side associated with disorder of lumbar spine As above - Lumbar Epidural Injection; Future  3. Facet arthritis of lumbar region Continue with core stretching strengthening exercises as reviewed once again today.  I have also talked about increasing her Ultram to 1 tablet 4 times a day as an option for her which would be appropriate.    ----------------------------------------------------------------------------------------------------------------------  I am having Audrey Chan maintain her albuterol, fluticasone, lisinopril, hydrochlorothiazide,  insulin glulisine, MULTI-VITAMINS, ferrous sulfate, metFORMIN, aspirin EC, atorvastatin, ranitidine, diclofenac sodium, cetirizine, Vitamin D3, polyethylene glycol powder, insulin glulisine, acetaminophen, rosuvastatin, ipratropium, fluconazole, gabapentin, IBU, predniSONE, traMADol, mupirocin ointment, methocarbamol, and mometasone.   No orders of the defined types were placed in this encounter.  Patient's Medications  New Prescriptions   No medications on file  Previous Medications   ACETAMINOPHEN (TYLENOL) 500 MG TABLET    Take 500 mg by mouth.   ALBUTEROL (PROVENTIL HFA;VENTOLIN HFA) 108 (90 BASE) MCG/ACT INHALER    Inhale 2 puffs into the lungs every 6 (six) hours as needed for wheezing or shortness of breath.    ASPIRIN EC 81 MG TABLET    Take 81 mg by mouth daily.   ATORVASTATIN (LIPITOR) 40 MG TABLET    Take 40 mg by mouth daily at 6 PM.    CETIRIZINE (ZYRTEC) 10 MG TABLET    Take by mouth daily.    CHOLECALCIFEROL (VITAMIN D3) 1000 UNITS CAPS    Take 1,000 Units by mouth daily.    DICLOFENAC SODIUM (VOLTAREN) 1 % GEL    Apply 2 g topically 4 (four) times daily.   FERROUS SULFATE 325 (65 FE) MG TABLET    Take 325 mg by mouth daily.   FLUCONAZOLE (DIFLUCAN) 150 MG TABLET       FLUTICASONE (FLONASE) 50 MCG/ACT NASAL SPRAY    Place 2 sprays into both nostrils daily as needed for allergies or rhinitis.    GABAPENTIN (NEURONTIN) 800 MG TABLET    Take 800 mg by mouth 3 (three) times daily.    HYDROCHLOROTHIAZIDE (HYDRODIURIL) 25 MG TABLET    Take 25 mg by mouth daily.   IBU 800 MG TABLET       INSULIN GLULISINE (APIDRA) 100 UNIT/ML INJECTION    Inject into the skin See admin instructions. Continuously via pump   INSULIN GLULISINE (APIDRA) 100 UNIT/ML INJECTION    Take up to 50 units daily in insulin pump, as directed   IPRATROPIUM (ATROVENT HFA) 17 MCG/ACT INHALER    Inhale into the lungs as needed.    LISINOPRIL (PRINIVIL,ZESTRIL) 10 MG TABLET    Take 10 mg by mouth daily.   METFORMIN  (GLUCOPHAGE-XR) 500 MG 24 HR TABLET    Take 500 mg by mouth 2 (two) times daily.   METHOCARBAMOL (ROBAXIN) 500 MG  TABLET    Take 500 mg by mouth 2 (two) times daily.   MOMETASONE (ELOCON) 0.1 % CREAM    Apply 1 application topically 2 (two) times daily as needed.   MULTIPLE VITAMIN (MULTI-VITAMINS) TABS    Take 1 tablet by mouth daily.   MUPIROCIN OINTMENT (BACTROBAN) 2 %    Apply to wound after soaking BID   POLYETHYLENE GLYCOL POWDER (GLYCOLAX/MIRALAX) POWDER    0.5 Containers as needed.    PREDNISONE (DELTASONE) 50 MG TABLET    Take one 50 mg tablet once daily for the next 4 days.   RANITIDINE (ZANTAC) 150 MG TABLET    Take 150 mg by mouth daily.    ROSUVASTATIN (CRESTOR) 20 MG TABLET    20 mg daily.    TRAMADOL (ULTRAM) 50 MG TABLET    Take 1 tablet (50 mg total) by mouth 3 (three) times daily.  Modified Medications   No medications on file  Discontinued Medications   No medications on file   ----------------------------------------------------------------------------------------------------------------------  Follow-up: Return in about 1 month (around 03/17/2018) for evaluation, procedure.    Molli Barrows, MD

## 2018-02-14 NOTE — Progress Notes (Signed)
Safety precautions to be maintained throughout the outpatient stay will include: orient to surroundings, keep bed in low position, maintain call bell within reach at all times, provide assistance with transfer out of bed and ambulation.  

## 2018-03-14 ENCOUNTER — Other Ambulatory Visit: Payer: Self-pay | Admitting: Anesthesiology

## 2018-03-14 ENCOUNTER — Other Ambulatory Visit: Payer: Self-pay | Admitting: Family Medicine

## 2018-03-14 ENCOUNTER — Ambulatory Visit (HOSPITAL_BASED_OUTPATIENT_CLINIC_OR_DEPARTMENT_OTHER): Payer: Medicare PPO | Admitting: Anesthesiology

## 2018-03-14 ENCOUNTER — Other Ambulatory Visit: Payer: Self-pay

## 2018-03-14 ENCOUNTER — Ambulatory Visit
Admission: RE | Admit: 2018-03-14 | Discharge: 2018-03-14 | Disposition: A | Discharge status: Home / Self Care | Payer: Medicare PPO | Source: Ambulatory Visit | Attending: Anesthesiology | Admitting: Anesthesiology

## 2018-03-14 ENCOUNTER — Encounter: Payer: Self-pay | Admitting: Anesthesiology

## 2018-03-14 VITALS — BP 152/79 | HR 85 | Temp 98.2°F | Resp 16 | Ht 67.0 in | Wt 138.4 lb

## 2018-03-14 DIAGNOSIS — G894 Chronic pain syndrome: Secondary | ICD-10-CM | POA: Diagnosis present

## 2018-03-14 DIAGNOSIS — R52 Pain, unspecified: Secondary | ICD-10-CM | POA: Diagnosis present

## 2018-03-14 DIAGNOSIS — M47816 Spondylosis without myelopathy or radiculopathy, lumbar region: Secondary | ICD-10-CM

## 2018-03-14 DIAGNOSIS — M5136 Other intervertebral disc degeneration, lumbar region: Secondary | ICD-10-CM

## 2018-03-14 DIAGNOSIS — M5386 Other specified dorsopathies, lumbar region: Secondary | ICD-10-CM | POA: Diagnosis present

## 2018-03-14 MED ORDER — IOPAMIDOL (ISOVUE-M 200) INJECTION 41%: 20.0000 mL | Freq: Once | INTRAMUSCULAR | Status: DC | PRN

## 2018-03-14 MED ORDER — IOPAMIDOL (ISOVUE-M 200) INJECTION 41%
INTRAMUSCULAR | Status: AC
  Filled 2018-03-14: qty 10

## 2018-03-14 MED ORDER — TRAMADOL HCL 50 MG PO TABS: 50.0000 mg | ORAL_TABLET | Freq: Three times a day (TID) | ORAL | 1 refills | Status: AC

## 2018-03-14 MED ORDER — SODIUM CHLORIDE (PF) 0.9 % IJ SOLN
INTRAMUSCULAR | Status: AC
  Filled 2018-03-14: qty 10

## 2018-03-14 MED ORDER — LIDOCAINE HCL (PF) 1 % IJ SOLN
5.0000 mL | Freq: Once | INTRAMUSCULAR | Status: DC
  Filled 2018-03-14: qty 5

## 2018-03-14 MED ORDER — SODIUM CHLORIDE 0.9% FLUSH: 10.0000 mL | Freq: Once | INTRAVENOUS | Status: DC

## 2018-03-14 MED ORDER — TRIAMCINOLONE ACETONIDE 40 MG/ML IJ SUSP
40.0000 mg | Freq: Once | INTRAMUSCULAR | Status: DC
  Filled 2018-03-14: qty 1

## 2018-03-14 MED ORDER — ROPIVACAINE HCL 2 MG/ML IJ SOLN
10.0000 mL | Freq: Once | INTRAMUSCULAR | Status: DC
  Filled 2018-03-14: qty 10

## 2018-03-14 NOTE — Progress Notes (Signed)
Safety precautions to be maintained throughout the outpatient stay will include: orient to surroundings, keep bed in low position, maintain call bell within reach at all times, provide assistance with transfer out of bed and ambulation.  

## 2018-03-14 NOTE — Progress Notes (Signed)
Subjective:  Patient ID: Audrey Chan, female    DOB: June 13, 1950  Age: 68 y.o. MRN: [id number]  CC: Procedure (hip injections )   Procedure: None  HPI Audrey Chan presents for reevaluation.  She was last seen several months ago and has had recurrence of the same lower back pain with hip pain primarily worse on the left side than right side.  She also reports numbness and tingling affecting both feet but primarily worse on the left side but intermittently bad on the right side as well.  No change in strength or lower extremity function is noted.  She also reports that in the past she is gotten 75% relief with epidurals when she receives these periodically for the same type syndrome.  No problems with bowel or bladder function are noted.  She takes tramadol approximately 1-2 times a day but has used this more regularly in the recent past secondary to the heightened pain.  Outpatient Medications Prior to Visit  Medication Sig Dispense Refill  . acetaminophen (TYLENOL) 500 MG tablet Take 500 mg by mouth.    Marland Kitchen albuterol (PROVENTIL HFA;VENTOLIN HFA) 108 (90 BASE) MCG/ACT inhaler Inhale 2 puffs into the lungs every 6 (six) hours as needed for wheezing or shortness of breath.     Marland Kitchen aspirin EC 81 MG tablet Take 81 mg by mouth daily.    Marland Kitchen atorvastatin (LIPITOR) 40 MG tablet Take 40 mg by mouth daily at 6 PM.     . cetirizine (ZYRTEC) 10 MG tablet Take by mouth daily.     . Cholecalciferol (VITAMIN D3) 1000 units CAPS Take 1,000 Units by mouth daily.     . diclofenac sodium (VOLTAREN) 1 % GEL Apply 2 g topically 4 (four) times daily. 100 g 0  . ferrous sulfate 325 (65 FE) MG tablet Take 325 mg by mouth daily.    . fluconazole (DIFLUCAN) 150 MG tablet     . fluticasone (FLONASE) 50 MCG/ACT nasal spray Place 2 sprays into both nostrils daily as needed for allergies or rhinitis.     Marland Kitchen gabapentin (NEURONTIN) 800 MG tablet Take 800 mg by mouth 3 (three) times daily.     . hydrochlorothiazide  (HYDRODIURIL) 25 MG tablet Take 25 mg by mouth daily.    . IBU 800 MG tablet     . insulin glulisine (APIDRA) 100 UNIT/ML injection Inject into the skin See admin instructions. Continuously via pump    . insulin glulisine (APIDRA) 100 UNIT/ML injection Take up to 50 units daily in insulin pump, as directed    . ipratropium (ATROVENT HFA) 17 MCG/ACT inhaler Inhale into the lungs as needed.     Marland Kitchen lisinopril (PRINIVIL,ZESTRIL) 10 MG tablet Take 10 mg by mouth daily.    . metFORMIN (GLUCOPHAGE-XR) 500 MG 24 hr tablet Take 500 mg by mouth 2 (two) times daily.    . methocarbamol (ROBAXIN) 500 MG tablet Take 500 mg by mouth 2 (two) times daily.    . mometasone (ELOCON) 0.1 % cream Apply 1 application topically 2 (two) times daily as needed.    . Multiple Vitamin (MULTI-VITAMINS) TABS Take 1 tablet by mouth daily.    . mupirocin ointment (BACTROBAN) 2 % Apply to wound after soaking BID 30 g 1  . polyethylene glycol powder (GLYCOLAX/MIRALAX) powder 0.5 Containers as needed.     . ranitidine (ZANTAC) 150 MG tablet Take 150 mg by mouth daily.     . rosuvastatin (CRESTOR) 20 MG tablet 20 mg daily.     Marland Kitchen  traMADol (ULTRAM) 50 MG tablet Take 1 tablet (50 mg total) by mouth 3 (three) times daily. 90 tablet 1  . predniSONE (DELTASONE) 50 MG tablet Take one 50 mg tablet once daily for the next 4 days. (Patient not taking: Reported on 03/14/2018) 4 tablet 0   No facility-administered medications prior to visit.     Review of Systems CNS: No confusion or sedation Cardiac: No angina or palpitations GI: No abdominal pain or constipation Constitutional: No nausea vomiting fevers or chills  Objective:  BP (!) 152/79   Pulse 85   Temp 98.2 F (36.8 C)   Resp 16   Ht 5\' 7"  (1.702 m)   Wt 138 lb 6.4 oz (62.8 kg)   SpO2 100%   BMI 21.68 kg/m    BP Readings from Last 3 Encounters:  03/14/18 (!) 152/79  02/14/18 (!) 150/79  10/25/17 (!) 140/94     Wt Readings from Last 3 Encounters:  03/14/18 138 lb  6.4 oz (62.8 kg)  02/14/18 137 lb 11.2 oz (62.5 kg)  10/25/17 140 lb (63.5 kg)     Physical Exam Pt is alert and oriented PERRL EOMI HEART IS RRR no murmur or rub LCTA no wheezing or rales MUSCULOSKELETAL reveals some paraspinous muscle tenderness and a positive straight leg raise left side negative right  Labs  No results found for: HGBA1C Lab Results  Component Value Date   CREATININE 1.10 (H) 09/13/2017    -------------------------------------------------------------------------------------------------------------------- Lab Results  Component Value Date   WBC 8.7 11/12/2016   HGB 12.5 11/12/2016   HCT 38.4 11/12/2016   PLT 321 11/12/2016   GLUCOSE 290 (H) 11/12/2016   ALT 44 11/12/2016   AST 44 (H) 11/12/2016   NA 139 11/12/2016   K 3.8 11/12/2016   CL 101 11/12/2016   CREATININE 1.10 (H) 09/13/2017   BUN 8 11/12/2016   CO2 29 11/12/2016    --------------------------------------------------------------------------------------------------------------------- No results found.   Assessment & Plan:   Audrey Chan was seen today for procedure.  Diagnoses and all orders for this visit:  DDD (degenerative disc disease), lumbar  Sciatica of right side associated with disorder of lumbar spine  Facet arthritis of lumbar region  Chronic pain syndrome  Other orders -     traMADol (ULTRAM) 50 MG tablet; Take 1 tablet (50 mg total) by mouth 3 (three) times daily for 30 days. -     triamcinolone acetonide (KENALOG-40) injection 40 mg -     sodium chloride flush (NS) 0.9 % injection 10 mL -     ropivacaine (PF) 2 mg/mL (0.2%) (NAROPIN) injection 10 mL -     lidocaine (PF) (XYLOCAINE) 1 % injection 5 mL -     iopamidol (ISOVUE-M) 41 % intrathecal injection 20 mL        ----------------------------------------------------------------------------------------------------------------------  Problem List Items Addressed This Visit    None    Visit Diagnoses    DDD  (degenerative disc disease), lumbar    -  Primary   Relevant Medications   traMADol (ULTRAM) 50 MG tablet   triamcinolone acetonide (KENALOG-40) injection 40 mg   Sciatica of right side associated with disorder of lumbar spine       Facet arthritis of lumbar region       Relevant Medications   traMADol (ULTRAM) 50 MG tablet   triamcinolone acetonide (KENALOG-40) injection 40 mg   Chronic pain syndrome            ----------------------------------------------------------------------------------------------------------------------  1. DDD (degenerative disc disease),  lumbar We were scheduled for a repeat lumbar epidural steroid injection today .  We have gone over the risks and benefits with her in full detail and we will do this at L5-S1.  Unfortunately she is not clear by insurance secondary to some confusion regarding the procedure.  We will reschedule her for return to clinic as soon as possible for this epidural injection.    2. Sciatica of right side associated with disorder of lumbar spine Continue stretching strengthening exercises as well.  3. Facet arthritis of lumbar region   4. Chronic pain syndrome We will refill her tramadol for 3 times daily dosing with 1 refill and as mentioned return to clinic in 1 month.    ----------------------------------------------------------------------------------------------------------------------  I have changed Audrey Chan traMADol. I am also having her maintain her albuterol, fluticasone, lisinopril, hydrochlorothiazide, insulin glulisine, MULTI-VITAMINS, ferrous sulfate, metFORMIN, aspirin EC, atorvastatin, ranitidine, diclofenac sodium, cetirizine, Vitamin D3, polyethylene glycol powder, insulin glulisine, acetaminophen, rosuvastatin, ipratropium, fluconazole, gabapentin, IBU, predniSONE, mupirocin ointment, methocarbamol, and mometasone.   Meds ordered this encounter  Medications  . traMADol (ULTRAM) 50 MG tablet    Sig:  Take 1 tablet (50 mg total) by mouth 3 (three) times daily for 30 days.    Dispense:  90 tablet    Refill:  1  . triamcinolone acetonide (KENALOG-40) injection 40 mg  . sodium chloride flush (NS) 0.9 % injection 10 mL  . ropivacaine (PF) 2 mg/mL (0.2%) (NAROPIN) injection 10 mL  . lidocaine (PF) (XYLOCAINE) 1 % injection 5 mL  . iopamidol (ISOVUE-M) 41 % intrathecal injection 20 mL   Patient's Medications  New Prescriptions   No medications on file  Previous Medications   ACETAMINOPHEN (TYLENOL) 500 MG TABLET    Take 500 mg by mouth.   ALBUTEROL (PROVENTIL HFA;VENTOLIN HFA) 108 (90 BASE) MCG/ACT INHALER    Inhale 2 puffs into the lungs every 6 (six) hours as needed for wheezing or shortness of breath.    ASPIRIN EC 81 MG TABLET    Take 81 mg by mouth daily.   ATORVASTATIN (LIPITOR) 40 MG TABLET    Take 40 mg by mouth daily at 6 PM.    CETIRIZINE (ZYRTEC) 10 MG TABLET    Take by mouth daily.    CHOLECALCIFEROL (VITAMIN D3) 1000 UNITS CAPS    Take 1,000 Units by mouth daily.    DICLOFENAC SODIUM (VOLTAREN) 1 % GEL    Apply 2 g topically 4 (four) times daily.   FERROUS SULFATE 325 (65 FE) MG TABLET    Take 325 mg by mouth daily.   FLUCONAZOLE (DIFLUCAN) 150 MG TABLET       FLUTICASONE (FLONASE) 50 MCG/ACT NASAL SPRAY    Place 2 sprays into both nostrils daily as needed for allergies or rhinitis.    GABAPENTIN (NEURONTIN) 800 MG TABLET    Take 800 mg by mouth 3 (three) times daily.    HYDROCHLOROTHIAZIDE (HYDRODIURIL) 25 MG TABLET    Take 25 mg by mouth daily.   IBU 800 MG TABLET       INSULIN GLULISINE (APIDRA) 100 UNIT/ML INJECTION    Inject into the skin See admin instructions. Continuously via pump   INSULIN GLULISINE (APIDRA) 100 UNIT/ML INJECTION    Take up to 50 units daily in insulin pump, as directed   IPRATROPIUM (ATROVENT HFA) 17 MCG/ACT INHALER    Inhale into the lungs as needed.    LISINOPRIL (PRINIVIL,ZESTRIL) 10 MG TABLET    Take 10 mg  by mouth daily.   METFORMIN  (GLUCOPHAGE-XR) 500 MG 24 HR TABLET    Take 500 mg by mouth 2 (two) times daily.   METHOCARBAMOL (ROBAXIN) 500 MG TABLET    Take 500 mg by mouth 2 (two) times daily.   MOMETASONE (ELOCON) 0.1 % CREAM    Apply 1 application topically 2 (two) times daily as needed.   MULTIPLE VITAMIN (MULTI-VITAMINS) TABS    Take 1 tablet by mouth daily.   MUPIROCIN OINTMENT (BACTROBAN) 2 %    Apply to wound after soaking BID   POLYETHYLENE GLYCOL POWDER (GLYCOLAX/MIRALAX) POWDER    0.5 Containers as needed.    PREDNISONE (DELTASONE) 50 MG TABLET    Take one 50 mg tablet once daily for the next 4 days.   RANITIDINE (ZANTAC) 150 MG TABLET    Take 150 mg by mouth daily.    ROSUVASTATIN (CRESTOR) 20 MG TABLET    20 mg daily.   Modified Medications   Modified Medication Previous Medication   TRAMADOL (ULTRAM) 50 MG TABLET traMADol (ULTRAM) 50 MG tablet      Take 1 tablet (50 mg total) by mouth 3 (three) times daily for 30 days.    Take 1 tablet (50 mg total) by mouth 3 (three) times daily.  Discontinued Medications   No medications on file   ----------------------------------------------------------------------------------------------------------------------  Follow-up: Return for evaluation, procedure.   Molli Barrows, MD

## 2018-03-15 ENCOUNTER — Telehealth: Payer: Self-pay

## 2018-03-15 NOTE — Telephone Encounter (Signed)
Pt was called, no answer and message left on answering service.

## 2018-03-18 ENCOUNTER — Telehealth: Payer: Self-pay

## 2018-03-18 ENCOUNTER — Other Ambulatory Visit: Payer: Self-pay | Admitting: *Deleted

## 2018-03-18 ENCOUNTER — Other Ambulatory Visit: Payer: Self-pay | Admitting: Family Medicine

## 2018-03-18 DIAGNOSIS — M25822 Other specified joint disorders, left elbow: Secondary | ICD-10-CM

## 2018-03-18 NOTE — Telephone Encounter (Signed)
Looks like there is an opening on 03/25/2018 for a procedure. Please move patient to this date if it suits her. Thank you-  Anderson Malta

## 2018-03-18 NOTE — Telephone Encounter (Signed)
I called the patient to schedule her lesi and Dr. Andree Elk doesn't have any procedure openings until 04/18/18. She wants to know if he can work her in before then. Please let me know a date if he can. Thanks

## 2018-03-21 ENCOUNTER — Other Ambulatory Visit: Payer: Self-pay | Admitting: Family Medicine

## 2018-03-21 ENCOUNTER — Other Ambulatory Visit (HOSPITAL_COMMUNITY): Payer: Self-pay | Admitting: Family Medicine

## 2018-03-21 ENCOUNTER — Ambulatory Visit
Admission: RE | Admit: 2018-03-21 | Discharge: 2018-03-21 | Disposition: A | Discharge status: Home / Self Care | Payer: Medicare PPO | Source: Ambulatory Visit | Attending: Family Medicine | Admitting: Family Medicine

## 2018-03-21 DIAGNOSIS — M25822 Other specified joint disorders, left elbow: Secondary | ICD-10-CM

## 2018-03-27 ENCOUNTER — Ambulatory Visit: Payer: Medicare PPO

## 2018-04-18 ENCOUNTER — Ambulatory Visit (HOSPITAL_BASED_OUTPATIENT_CLINIC_OR_DEPARTMENT_OTHER): Payer: Medicare PPO | Admitting: Anesthesiology

## 2018-04-18 ENCOUNTER — Ambulatory Visit
Admission: RE | Admit: 2018-04-18 | Discharge: 2018-04-18 | Disposition: A | Discharge status: Home / Self Care | Payer: Medicare PPO | Source: Ambulatory Visit | Attending: Anesthesiology | Admitting: Anesthesiology

## 2018-04-18 ENCOUNTER — Encounter: Payer: Self-pay | Admitting: Anesthesiology

## 2018-04-18 ENCOUNTER — Other Ambulatory Visit: Payer: Self-pay

## 2018-04-18 ENCOUNTER — Other Ambulatory Visit: Payer: Self-pay | Admitting: Anesthesiology

## 2018-04-18 VITALS — BP 150/77 | HR 85 | Temp 97.7°F | Resp 20 | Ht 67.0 in | Wt 138.0 lb

## 2018-04-18 DIAGNOSIS — G894 Chronic pain syndrome: Secondary | ICD-10-CM | POA: Diagnosis not present

## 2018-04-18 DIAGNOSIS — R52 Pain, unspecified: Secondary | ICD-10-CM | POA: Diagnosis present

## 2018-04-18 DIAGNOSIS — M5386 Other specified dorsopathies, lumbar region: Secondary | ICD-10-CM

## 2018-04-18 DIAGNOSIS — M47817 Spondylosis without myelopathy or radiculopathy, lumbosacral region: Secondary | ICD-10-CM | POA: Diagnosis present

## 2018-04-18 DIAGNOSIS — M5136 Other intervertebral disc degeneration, lumbar region: Secondary | ICD-10-CM | POA: Insufficient documentation

## 2018-04-18 DIAGNOSIS — M51369 Other intervertebral disc degeneration, lumbar region without mention of lumbar back pain or lower extremity pain: Secondary | ICD-10-CM

## 2018-04-18 MED ORDER — TRIAMCINOLONE ACETONIDE 40 MG/ML IJ SUSP
INTRAMUSCULAR | Status: AC
  Filled 2018-04-18: qty 1

## 2018-04-18 MED ORDER — SODIUM CHLORIDE (PF) 0.9 % IJ SOLN
INTRAMUSCULAR | Status: AC
  Filled 2018-04-18: qty 10

## 2018-04-18 MED ORDER — ROPIVACAINE HCL 2 MG/ML IJ SOLN
10.0000 mL | Freq: Once | INTRAMUSCULAR | Status: AC
  Administered 2018-04-18: 10 mL via EPIDURAL

## 2018-04-18 MED ORDER — SODIUM CHLORIDE 0.9% FLUSH
10.0000 mL | Freq: Once | INTRAVENOUS | Status: AC
  Administered 2018-04-18: 10 mL

## 2018-04-18 MED ORDER — TRIAMCINOLONE ACETONIDE 40 MG/ML IJ SUSP
40.0000 mg | Freq: Once | INTRAMUSCULAR | Status: AC
  Administered 2018-04-18: 40 mg

## 2018-04-18 MED ORDER — IOPAMIDOL (ISOVUE-M 200) INJECTION 41%
20.0000 mL | Freq: Once | INTRAMUSCULAR | Status: DC | PRN
  Administered 2018-04-18: 10 mL
  Filled 2018-04-18: qty 20

## 2018-04-18 MED ORDER — IOPAMIDOL (ISOVUE-M 200) INJECTION 41%
INTRAMUSCULAR | Status: AC
  Filled 2018-04-18: qty 10

## 2018-04-18 MED ORDER — ROPIVACAINE HCL 2 MG/ML IJ SOLN
INTRAMUSCULAR | Status: AC
  Filled 2018-04-18: qty 10

## 2018-04-18 MED ORDER — LIDOCAINE HCL (PF) 1 % IJ SOLN
INTRAMUSCULAR | Status: AC
  Filled 2018-04-18: qty 5

## 2018-04-18 MED ORDER — LIDOCAINE HCL (PF) 1 % IJ SOLN
5.0000 mL | Freq: Once | INTRAMUSCULAR | Status: AC
  Administered 2018-04-18: 5 mL via SUBCUTANEOUS

## 2018-04-18 NOTE — Patient Instructions (Signed)
____________________________________________________________________________________________  Post-procedure Information What to expect: Most procedures involve the use of a local anesthetic (numbing medicine), and a steroid (anti-inflammatory medicine).  The local anesthetics may cause temporary numbness and weakness of the legs or arms, depending on the location of the block. This numbness/weakness may last 4-6 hours, depending on the local anesthetic used. In rare instances, it can last up to 24 hours. While numb, you must be very careful not to injure the extremity.  After any procedure, you could expect the pain to get better within 15-20 minutes. This relief is temporary and may last 4-6 hours. Once the local anesthetics wears off, you could experience discomfort, possibly more than usual, for up to 10 (ten) days. In the case of radiofrequencies, it may last up to 6 weeks. Surgeries may take up to 8 weeks for the healing process. The discomfort is due to the irritation caused by needles going through skin and muscle. To minimize the discomfort, we recommend using ice the first day, and heat from then on. The ice should be applied for 15 minutes on, and 15 minutes off. Keep repeating this cycle until bedtime. Avoid applying the ice directly to the skin, to prevent frostbite. Heat should be used daily, until the pain improves (4-10 days). Be careful not to burn yourself.  Occasionally you may experience muscle spasms or cramps. These occur as a consequence of the irritation caused by the needle sticks to the muscle and the blood that will inevitably be lost into the surrounding muscle tissue. Blood tends to be very irritating to tissues, which tend to react by going into spasm. These spasms may start the same day of your procedure, but they may also take days to develop. This late onset type of spasm or cramp is usually caused by electrolyte imbalances triggered by the steroids, at the level of the  kidney. Cramps and spasms tend to respond well to muscle relaxants, multivitamins (some are triggered by the procedure, but may have their origins in vitamin deficiencies), and "Gatorade", or any sports drinks that can replenish any electrolyte imbalances. (If you are a diabetic, ask your pharmacist to get you a sugar-free brand.) Warm showers or baths may also be helpful. Stretching exercises are highly recommended.  General Instructions:  Be alert for signs of possible infection: redness, swelling, heat, red streaks, elevated temperature, and/or fever. These typically appear 4 to 6 days after the procedure. Immediately notify your doctor if you experience unusual bleeding, difficulty breathing, or loss of bowel or bladder control. If you experience increased pain, do not increase your pain medicine intake, unless instructed by your pain physician.  Post-Procedure Care:  Be careful in moving about. Muscle spasms in the area of the injection may occur. Applying ice or heat to the area is often helpful. The incidence of spinal headaches after epidural injections ranges between 1.4% and 6%. If you develop a headache that does not seem to respond to conservative therapy, please let your physician know. This can be treated with an epidural blood patch.   Post-procedure numbness or redness is to be expected, however it should average 4 to 6 hours. If numbness and weakness of your extremities begins to develop 4 to 6 hours after your procedure, and is felt to be progressing and worsening, immediately contact your physician.  Diet:  If you experience nausea, do not eat until this sensation goes away. If you had a "Stellate Ganglion Block" for upper extremity "Reflex Sympathetic Dystrophy", do not eat or   drink until your hoarseness goes away. In any case, always start with liquids first and if you tolerate them well, then slowly progress to more solid foods.  Activity:  For the first 4 to 6 hours after the  procedure, use caution in moving about as you may experience numbness and/or weakness. Use caution in cooking, using household electrical appliances, and climbing steps. If you need to reach your Doctor call our office: [phone] (During business hours) or [phone] (After business hours).  Business Hours: Monday-Thursday 8:00 am - 4:00 PM    Fridays: Closed     In case of an emergency: In case of emergency, call 911 or go to the nearest emergency room and have the physician there call us.  Interpretation of Procedure Every nerve block has two components: a diagnostic component, and a treatment component. Unrealistic expectations are the most common causes of "perceived failure".  In a perfect world, a single nerve block should be able to completely and permanently eliminate the pain. Sadly, the world is not perfect.  Most pain management nerve blocks are performed using local anesthetics and steroids. Steroids are responsible for any long-term benefit that you may experience. Their purpose is to decrease any chronic swelling that may exist in the area. Steroids begin to work immediately after being injected. However, most patients will not experience any benefits until 5 to 10 days after the injection, when the swelling has come down to the point where they can tell a difference. Steroids will only help if there is swelling to be treated. As such, they can assist with the diagnosis. If effective, they suggest an inflammatory component to the pain, and if ineffective, they rule out inflammation as the main cause or component of the problem. If the problem is one of mechanical compression, you will get no benefit from those steroids.   In the case of local anesthetics, they have a crucial role in the diagnosis of your condition. Most will begin to work within15 to 20 minutes after injection. The duration will depend on the type used (short- vs. Long-acting). It is of outmost importance that  patients keep tract of their pain, after the procedure. To assist with this matter, a "Post-procedure Pain Diary" is provided. Make sure to complete it and to bring it back to your follow-up appointment.  As long as the patient keeps accurate, detailed records of their symptoms after every procedure, and returns to have those interpreted, every procedure will provide us with invaluable information. Even a block that does not provide the patient with any relief, will always provide us with information about the mechanism and the origin of the pain. The only time a nerve block can be considered a waste of time is when patients do not keep track of the results, or do not keep their post-procedure appointment.  Reporting the results back to your physician The Pain Score  Pain is a subjective complaint. It cannot be seen, touched, or measured. We depend entirely on the patient's report of the pain in order to assess your condition and treatment. To evaluate the pain, we use a pain scale, where "0" means "No Pain", and a "10" is "the worst possible pain that you can even imagine" (i.e. something like been eaten alive by a shark or being torn apart by a lion).   Use the Pain Scale provided. You will frequently be asked to rate your pain. Please be accurate, remember that medical decisions will be based on your   responses. Please do not rate your pain above a 10. Doing so is actually interpreted as "symptom magnification" (exaggeration). To put this into perspective, when you tell us that your pain is at a 10 (ten), what you are saying is that there is nothing we can do to make this pain any worse. (Carefully think about that.) ____________________________________________________________________________________________   Pain Management Discharge Instructions  General Discharge Instructions :  If you need to reach your doctor call: Monday-Friday 8:00 am - 4:00 pm at [phone] or toll free [phone].   After clinic hours [phone] to have operator reach doctor.  Bring all of your medication bottles to all your appointments in the pain clinic.  To cancel or reschedule your appointment with Pain Management please remember to call 24 hours in advance to avoid a fee.  Refer to the educational materials which you have been given on: General Risks, I had my Procedure. Discharge Instructions, Post Sedation.  Post Procedure Instructions:  Please notify your doctor immediately if you have any unusual bleeding, trouble breathing or pain that is not related to your normal pain.  Depending on the type of procedure that was done, some parts of your body may feel week and/or numb.  This usually clears up by tonight or the next day.  Walk with the use of an assistive device or accompanied by an adult for the 24 hours.  You may use ice on the affected area for the first 24 hours.  Put ice in a Ziploc bag and cover with a towel and place against area 15 minutes on 15 minutes off.  You may switch to heat after 24 hours.   ______________________________________________________________________________________________  Specialty Pain Scale  Introduction:  There are significant differences in how pain is reported. The word pain usually refers to physical pain, but it is also a common synonym of suffering. The medical community uses a scale from 0 (zero) to 10 (ten) to report pain level. Zero (0) is described as "no pain", while ten (10) is described as "the worse pain you can imagine". The problem with this scale is that physical pain is reported along with suffering. Suffering refers to mental pain, or more often yet it refers to any unpleasant feeling, emotion or aversion associated with the perception of harm or threat of harm. It is the psychological component of pain.  Pain Specialists prefer to separate the two components. The pain scale used by this practice is the Verbal Numerical Rating Scale  (VNRS-11). This scale is for the physical pain only. DO NOT INCLUDE how your pain psychologically affects you. This scale is for adults 64 years of age and older. It has 11 (eleven) levels. The 1st level is 0/10. This means: "right now, I have no pain". In the context of pain management, it also means: "right now, my physical pain is under control with the current therapy".  General Information:  The scale should reflect your current level of pain. Unless you are specifically asked for the level of your worst pain, or your average pain. If you are asked for one of these two, then it should be understood that it is over the past 24 hours.  Levels 1 (one) through 5 (five) are described below, and can be treated as an outpatient. Ambulatory pain management facilities such as ours are more than adequate to treat these levels. Levels 6 (six) through 10 (ten) are also described below, however, these must be treated as a hospitalized patient. While levels 6 (six)  and 7 (seven) may be evaluated at an urgent care facility, levels 8 (eight) through 10 (ten) constitute medical emergencies and as such, they belong in a hospital's emergency department. When having these levels (as described below), do not come to our office. Our facility is not equipped to manage these levels. Go directly to an urgent care facility or an emergency department to be evaluated.  Definitions:  Activities of Daily Living (ADL): Activities of daily living (ADL or ADLs) is a term used in healthcare to refer to people's daily self-care activities. Health professionals often use a person's ability or inability to perform ADLs as a measurement of their functional status, particularly in regard to people post injury, with disabilities and the elderly. There are two ADL levels: Basic and Instrumental. Basic Activities of Daily Living (BADL  or BADLs) consist of self-care tasks that include: Bathing and showering; personal hygiene and grooming  (including brushing/combing/styling hair); dressing; Toilet hygiene (getting to the toilet, cleaning oneself, and getting back up); eating and self-feeding (not including cooking or chewing and swallowing); functional mobility, often referred to as "transferring", as measured by the ability to walk, get in and out of bed, and get into and out of a chair; the broader definition (moving from one place to another while performing activities) is useful for people with different physical abilities who are still able to get around independently. Basic ADLs include the things many people do when they get up in the morning and get ready to go out of the house: get out of bed, go to the toilet, bathe, dress, groom, and eat. On the average, loss of function typically follows a particular order. Hygiene is the first to go, followed by loss of toilet use and locomotion. The last to go is the ability to eat. When there is only one remaining area in which the person is independent, there is a 62.9% chance that it is eating and only a 3.5% chance that it is hygiene. Instrumental Activities of Daily Living (IADL or IADLs) are not necessary for fundamental functioning, but they let an individual live independently in a community. IADL consist of tasks that include: cleaning and maintaining the house; home establishment and maintenance; care of others (including selecting and supervising caregivers); care of pets; child rearing; managing money; managing financials (investments, etc.); meal preparation and cleanup; shopping for groceries and necessities; moving within the community; safety procedures and emergency responses; health management and maintenance (taking prescribed medications); and using the telephone or other form of communication.  Instructions:  Most patients tend to report their pain as a combination of two factors, their physical pain and their psychosocial pain. This last one is also known as "suffering" and it  is reflection of how physical pain affects you socially and psychologically. From now on, report them separately.  From this point on, when asked to report your pain level, report only your physical pain. Use the following table for reference.  Pain Clinic Pain Levels (0-5/10)  Pain Level Score  Description  No Pain 0   Mild pain 1 Nagging, annoying, but does not interfere with basic activities of daily living (ADL). Patients are able to eat, bathe, get dressed, toileting (being able to get on and off the toilet and perform personal hygiene functions), transfer (move in and out of bed or a chair without assistance), and maintain continence (able to control bladder and bowel functions). Blood pressure and heart rate are unaffected. A normal heart rate for a healthy adult  ranges from 60 to 100 bpm (beats per minute).   Mild to moderate pain 2 Noticeable and distracting. Impossible to hide from other people. More frequent flare-ups. Still possible to adapt and function close to normal. It can be very annoying and may have occasional stronger flare-ups. With discipline, patients may get used to it and adapt.   Moderate pain 3 Interferes significantly with activities of daily living (ADL). It becomes difficult to feed, bathe, get dressed, get on and off the toilet or to perform personal hygiene functions. Difficult to get in and out of bed or a chair without assistance. Very distracting. With effort, it can be ignored when deeply involved in activities.   Moderately severe pain 4 Impossible to ignore for more than a few minutes. With effort, patients may still be able to manage work or participate in some social activities. Very difficult to concentrate. Signs of autonomic nervous system discharge are evident: dilated pupils (mydriasis); mild sweating (diaphoresis); sleep interference. Heart rate becomes elevated (>115 bpm). Diastolic blood pressure (lower number) rises above 100 mmHg. Patients find relief  in laying down and not moving.   Severe pain 5 Intense and extremely unpleasant. Associated with frowning face and frequent crying. Pain overwhelms the senses.  Ability to do any activity or maintain social relationships becomes significantly limited. Conversation becomes difficult. Pacing back and forth is common, as getting into a comfortable position is nearly impossible. Pain wakes you up from deep sleep. Physical signs will be obvious: pupillary dilation; increased sweating; goosebumps; brisk reflexes; cold, clammy hands and feet; nausea, vomiting or dry heaves; loss of appetite; significant sleep disturbance with inability to fall asleep or to remain asleep. When persistent, significant weight loss is observed due to the complete loss of appetite and sleep deprivation.  Blood pressure and heart rate becomes significantly elevated. Caution: If elevated blood pressure triggers a pounding headache associated with blurred vision, then the patient should immediately seek attention at an urgent or emergency care unit, as these may be signs of an impending stroke.    Emergency Department Pain Levels (6-10/10)  Emergency Room Pain 6 Severely limiting. Requires emergency care and should not be seen or managed at an outpatient pain management facility. Communication becomes difficult and requires great effort. Assistance to reach the emergency department may be required. Facial flushing and profuse sweating along with potentially dangerous increases in heart rate and blood pressure will be evident.   Distressing pain 7 Self-care is very difficult. Assistance is required to transport, or use restroom. Assistance to reach the emergency department will be required. Tasks requiring coordination, such as bathing and getting dressed become very difficult.   Disabling pain 8 Self-care is no longer possible. At this level, pain is disabling. The individual is unable to do even the most "basic" activities such as  walking, eating, bathing, dressing, transferring to a bed, or toileting. Fine motor skills are lost. It is difficult to think clearly.   Incapacitating pain 9 Pain becomes incapacitating. Thought processing is no longer possible. Difficult to remember your own name. Control of movement and coordination are lost.   The worst pain imaginable 10 At this level, most patients pass out from pain. When this level is reached, collapse of the autonomic nervous system occurs, leading to a sudden drop in blood pressure and heart rate. This in turn results in a temporary and dramatic drop in blood flow to the brain, leading to a loss of consciousness. Fainting is one of the body's self  defense mechanisms. Passing out puts the brain in a calmed state and causes it to shut down for a while, in order to begin the healing process.    Summary: 1. Refer to this scale when providing [place] with your pain level. 2. Be accurate and careful when reporting your pain level. This will help with your care. 3. Over-reporting your pain level will lead to loss of credibility. 4. Even a level of 1/10 means that there is pain and will be treated at our facility. 5. High, inaccurate reporting will be documented as "Symptom Exaggeration", leading to loss of credibility and suspicions of possible secondary gains such as obtaining more narcotics, or wanting to appear disabled, for fraudulent reasons. 6. Only pain levels of 5 or below will be seen at our facility. 7. Pain levels of 6 and above will be sent to the Emergency Department and the appointment cancelled. ______________________________________________________________________________________________

## 2018-04-18 NOTE — Progress Notes (Signed)
Subjective:  Patient ID: Audrey Chan, female    DOB: 07-May-1950  Age: 68 y.o. MRN: [id number]  CC: Back Pain (lower)   Procedure: L5-S1 epidural steroid under fluoroscopic guidance with no sedation  HPI Audrey Chan presents for reevaluation.  She was last seen a month ago and continues to have pain in the left lower extremity more so than the right side.  Primarily spasming pain radiating into the left calf and buttock region.  She gets occasional pain into the right side but this is primarily left-sided.  No significant changes are otherwise noted.  No changes in bowel or bladder function or strength are noted.  She uses tramadol occasionally for pain but uses this sparingly.  Otherwise she is in her usual state of health at this stage  Outpatient Medications Prior to Visit  Medication Sig Dispense Refill  . acetaminophen (TYLENOL) 500 MG tablet Take 500 mg by mouth.    Marland Kitchen albuterol (PROVENTIL HFA;VENTOLIN HFA) 108 (90 BASE) MCG/ACT inhaler Inhale 2 puffs into the lungs every 6 (six) hours as needed for wheezing or shortness of breath.     Marland Kitchen aspirin EC 81 MG tablet Take 81 mg by mouth daily.    Marland Kitchen atorvastatin (LIPITOR) 40 MG tablet Take 40 mg by mouth daily at 6 PM.     . cetirizine (ZYRTEC) 10 MG tablet Take by mouth daily.     . Cholecalciferol (VITAMIN D3) 1000 units CAPS Take 1,000 Units by mouth daily.     . diclofenac sodium (VOLTAREN) 1 % GEL Apply 2 g topically 4 (four) times daily. 100 g 0  . ferrous sulfate 325 (65 FE) MG tablet Take 325 mg by mouth daily.    . fluconazole (DIFLUCAN) 150 MG tablet     . fluticasone (FLONASE) 50 MCG/ACT nasal spray Place 2 sprays into both nostrils daily as needed for allergies or rhinitis.     Marland Kitchen gabapentin (NEURONTIN) 800 MG tablet Take 800 mg by mouth 3 (three) times daily.     . hydrochlorothiazide (HYDRODIURIL) 25 MG tablet Take 25 mg by mouth daily.    . IBU 800 MG tablet     . insulin glulisine (APIDRA) 100 UNIT/ML injection  Inject into the skin See admin instructions. Continuously via pump    . insulin glulisine (APIDRA) 100 UNIT/ML injection Take up to 50 units daily in insulin pump, as directed    . ipratropium (ATROVENT HFA) 17 MCG/ACT inhaler Inhale into the lungs as needed.     Marland Kitchen lisinopril (PRINIVIL,ZESTRIL) 10 MG tablet Take 10 mg by mouth daily.    . metFORMIN (GLUCOPHAGE-XR) 500 MG 24 hr tablet Take 500 mg by mouth 2 (two) times daily.    . methocarbamol (ROBAXIN) 500 MG tablet Take 500 mg by mouth 2 (two) times daily.    . mometasone (ELOCON) 0.1 % cream Apply 1 application topically 2 (two) times daily as needed.    . Multiple Vitamin (MULTI-VITAMINS) TABS Take 1 tablet by mouth daily.    . mupirocin ointment (BACTROBAN) 2 % Apply to wound after soaking BID 30 g 1  . polyethylene glycol powder (GLYCOLAX/MIRALAX) powder 0.5 Containers as needed.     . ranitidine (ZANTAC) 150 MG tablet Take 150 mg by mouth daily.     . rosuvastatin (CRESTOR) 20 MG tablet 20 mg daily.     . predniSONE (DELTASONE) 50 MG tablet Take one 50 mg tablet once daily for the next 4 days. (Patient not taking: Reported  on 03/14/2018) 4 tablet 0   No facility-administered medications prior to visit.     Review of Systems CNS: No confusion or sedation Cardiac: No angina or palpitations GI: No abdominal pain or constipation Constitutional: No nausea vomiting fevers or chills  Objective:  BP (!) 147/74   Pulse 85   Temp 97.7 F (36.5 C) (Oral)   Resp 18   Ht 5\' 7"  (1.702 m)   Wt 138 lb (62.6 kg)   SpO2 100%   BMI 21.61 kg/m    BP Readings from Last 3 Encounters:  04/18/18 (!) 147/74  03/14/18 (!) 152/79  02/14/18 (!) 150/79     Wt Readings from Last 3 Encounters:  04/18/18 138 lb (62.6 kg)  03/14/18 138 lb 6.4 oz (62.8 kg)  02/14/18 137 lb 11.2 oz (62.5 kg)     Physical Exam Pt is alert and oriented PERRL EOMI HEART IS RRR no murmur or rub LCTA no wheezing or rales MUSCULOSKELETAL reveals a positive  straight leg raise left side negative right.  Muscle tone and bulk is still well preserved  Labs  No results found for: HGBA1C Lab Results  Component Value Date   CREATININE 1.10 (H) 09/13/2017    -------------------------------------------------------------------------------------------------------------------- Lab Results  Component Value Date   WBC 8.7 11/12/2016   HGB 12.5 11/12/2016   HCT 38.4 11/12/2016   PLT 321 11/12/2016   GLUCOSE 290 (H) 11/12/2016   ALT 44 11/12/2016   AST 44 (H) 11/12/2016   NA 139 11/12/2016   K 3.8 11/12/2016   CL 101 11/12/2016   CREATININE 1.10 (H) 09/13/2017   BUN 8 11/12/2016   CO2 29 11/12/2016    --------------------------------------------------------------------------------------------------------------------- No results found.   Assessment & Plan:   Audrey Chan was seen today for back pain.  Diagnoses and all orders for this visit:  Chronic pain syndrome  DDD (degenerative disc disease), lumbar -     Lumbar Epidural Injection -     Lumbar Epidural Injection; Future  Sciatica of right side associated with disorder of lumbar spine -     Lumbar Epidural Injection -     Lumbar Epidural Injection; Future  Lumbosacral spondylosis without myelopathy -     Lumbar Epidural Injection; Future        ----------------------------------------------------------------------------------------------------------------------  Problem List Items Addressed This Visit    None    Visit Diagnoses    Chronic pain syndrome    -  Primary   DDD (degenerative disc disease), lumbar       Relevant Orders   Lumbar Epidural Injection   Sciatica of right side associated with disorder of lumbar spine       Relevant Orders   Lumbar Epidural Injection   Lumbosacral spondylosis without myelopathy       Relevant Orders   Lumbar Epidural Injection         ----------------------------------------------------------------------------------------------------------------------  1. DDD (degenerative disc disease), lumbar We will plan on an epidural steroid injection today as reviewed with the patient.  The risks and benefits have been reviewed in full detail all questions answered.  We will have her return to clinic in 1 month for repeat injection.  Fortunately she has responded favorably to these in the past with 75% reduction in pain generally lasting several months.  Her last epidural injection was in September 2019. - Lumbar Epidural Injection - Lumbar Epidural Injection; Future  2. Sciatica of left side associated with disorder of lumbar spine As above - Lumbar Epidural Injection - Lumbar Epidural  Injection; Future  3. Chronic pain syndrome As above.  We have gone over several exercises to help with stretching strengthening and she can continue tramadol for breakthrough intermittent pain.  4. Lumbosacral spondylosis without myelopathy As above.  We have also talked about potential for a neurosurgical consultation should she get any worsening of the pain or associated weakness. - Lumbar Epidural Injection; Future    ----------------------------------------------------------------------------------------------------------------------  I am having Audrey Chan maintain her albuterol, fluticasone, lisinopril, hydrochlorothiazide, insulin glulisine, MULTI-VITAMINS, ferrous sulfate, metFORMIN, aspirin EC, atorvastatin, ranitidine, diclofenac sodium, cetirizine, Vitamin D3, polyethylene glycol powder, insulin glulisine, acetaminophen, rosuvastatin, ipratropium, fluconazole, gabapentin, IBU, predniSONE, mupirocin ointment, methocarbamol, and mometasone.   No orders of the defined types were placed in this encounter.  Patient's Medications  New Prescriptions   No medications on file  Previous Medications   ACETAMINOPHEN (TYLENOL)  500 MG TABLET    Take 500 mg by mouth.   ALBUTEROL (PROVENTIL HFA;VENTOLIN HFA) 108 (90 BASE) MCG/ACT INHALER    Inhale 2 puffs into the lungs every 6 (six) hours as needed for wheezing or shortness of breath.    ASPIRIN EC 81 MG TABLET    Take 81 mg by mouth daily.   ATORVASTATIN (LIPITOR) 40 MG TABLET    Take 40 mg by mouth daily at 6 PM.    CETIRIZINE (ZYRTEC) 10 MG TABLET    Take by mouth daily.    CHOLECALCIFEROL (VITAMIN D3) 1000 UNITS CAPS    Take 1,000 Units by mouth daily.    DICLOFENAC SODIUM (VOLTAREN) 1 % GEL    Apply 2 g topically 4 (four) times daily.   FERROUS SULFATE 325 (65 FE) MG TABLET    Take 325 mg by mouth daily.   FLUCONAZOLE (DIFLUCAN) 150 MG TABLET       FLUTICASONE (FLONASE) 50 MCG/ACT NASAL SPRAY    Place 2 sprays into both nostrils daily as needed for allergies or rhinitis.    GABAPENTIN (NEURONTIN) 800 MG TABLET    Take 800 mg by mouth 3 (three) times daily.    HYDROCHLOROTHIAZIDE (HYDRODIURIL) 25 MG TABLET    Take 25 mg by mouth daily.   IBU 800 MG TABLET       INSULIN GLULISINE (APIDRA) 100 UNIT/ML INJECTION    Inject into the skin See admin instructions. Continuously via pump   INSULIN GLULISINE (APIDRA) 100 UNIT/ML INJECTION    Take up to 50 units daily in insulin pump, as directed   IPRATROPIUM (ATROVENT HFA) 17 MCG/ACT INHALER    Inhale into the lungs as needed.    LISINOPRIL (PRINIVIL,ZESTRIL) 10 MG TABLET    Take 10 mg by mouth daily.   METFORMIN (GLUCOPHAGE-XR) 500 MG 24 HR TABLET    Take 500 mg by mouth 2 (two) times daily.   METHOCARBAMOL (ROBAXIN) 500 MG TABLET    Take 500 mg by mouth 2 (two) times daily.   MOMETASONE (ELOCON) 0.1 % CREAM    Apply 1 application topically 2 (two) times daily as needed.   MULTIPLE VITAMIN (MULTI-VITAMINS) TABS    Take 1 tablet by mouth daily.   MUPIROCIN OINTMENT (BACTROBAN) 2 %    Apply to wound after soaking BID   POLYETHYLENE GLYCOL POWDER (GLYCOLAX/MIRALAX) POWDER    0.5 Containers as needed.    PREDNISONE (DELTASONE)  50 MG TABLET    Take one 50 mg tablet once daily for the next 4 days.   RANITIDINE (ZANTAC) 150 MG TABLET    Take 150 mg by mouth  daily.    ROSUVASTATIN (CRESTOR) 20 MG TABLET    20 mg daily.   Modified Medications   No medications on file  Discontinued Medications   No medications on file   ----------------------------------------------------------------------------------------------------------------------  Follow-up: Return for evaluation, procedure.   Procedure: L5-S1 LESI with fluoroscopic guidance and no moderate sedation  NOTE: The risks, benefits, and expectations of the procedure have been discussed and explained to the patient who was understanding and in agreement with suggested treatment plan. No guarantees were made.  DESCRIPTION OF PROCEDURE: Lumbar epidural steroid injection with no IV Versed, EKG, blood pressure, pulse, and pulse oximetry monitoring. The procedure was performed with the patient in the prone position under fluoroscopic guidance.  Sterile prep x3 was initiated and I then injected subcutaneous lidocaine to the overlying L5-S1 site after its fluoroscopic identifictation.  Using strict aseptic technique, I then advanced an 18-gauge Tuohy epidural needle in the midline using interlaminar approach via loss-of-resistance to saline technique. There was negative aspiration for heme or  CSF.  I then confirmed position with both AP and Lateral fluoroscan.  2 cc of Isovue were injected and a  total of 5 mL of Preservative-Free normal saline mixed with 40 mg of Kenalog and 1cc Ropicaine 0.2 percent were injected incrementally via the  epidurally placed needle. The needle was removed. The patient tolerated the injection well and was convalesced and discharged to home in stable condition. Should the patient have any post procedure difficulty they have been instructed on how to contact us for assistance.    Molli Barrows, MD

## 2018-04-18 NOTE — Progress Notes (Signed)
Safety precautions to be maintained throughout the outpatient stay will include: orient to surroundings, keep bed in low position, maintain call bell within reach at all times, provide assistance with transfer out of bed and ambulation.  

## 2018-04-19 ENCOUNTER — Telehealth: Payer: Self-pay

## 2018-04-19 NOTE — Telephone Encounter (Signed)
Post procedure phone call.  LM 

## 2018-04-25 ENCOUNTER — Encounter: Payer: Self-pay | Admitting: Surgery

## 2018-04-25 ENCOUNTER — Other Ambulatory Visit: Payer: Self-pay

## 2018-04-25 ENCOUNTER — Ambulatory Visit (INDEPENDENT_AMBULATORY_CARE_PROVIDER_SITE_OTHER): Payer: Medicare PPO | Admitting: Surgery

## 2018-04-25 VITALS — BP 143/83 | HR 102 | Temp 97.9°F | Ht 67.0 in | Wt 134.6 lb

## 2018-04-25 DIAGNOSIS — D367 Benign neoplasm of other specified sites: Secondary | ICD-10-CM

## 2018-04-25 NOTE — H&P (View-Only) (Signed)
Surgical Clinic History and Physical  Referring provider:  Theotis Burrow, MD [address], [place] [address]  HISTORY OF PRESENT ILLNESS (HPI):  68 y.o. female presents for evaluation of her Left upper extremity subcutaneous mass. Patient reports her Left antecubital subcutaneous mass has been present x at least 1 year, which patient first noticed when a phlebotomist unsuccessfully attempted to draw blood from it. It has since enlarged periodically and then regresses without surrounding redness or drainage. She describes it hurts more when it enlarges, and she denies any prior drainage or excision procedure(s). She denies any Left elbow or Left arm trauma and denies Left upper extremity joint/bone pain despite chronic lower back pain, for which she's underwent epidural placement for pain management and similarly multiple steroid injections for her lower back pain. She denies having taken any recent systemic steroids, such as Prednisone.  PAST MEDICAL HISTORY (PMH):  Past Medical History:  Diagnosis Date  . Allergic rhinitis   . Cholelithiasis   . Diabetes type 2, controlled ([place])   . GERD (gastroesophageal reflux disease)   . Hypertension   . Neuropathy     PAST SURGICAL HISTORY ([place]):  Past Surgical History:  Procedure Laterality Date  . ABDOMINAL HYSTERECTOMY  1972  . CERVICAL FUSION    . CHOLECYSTECTOMY N/A 12/28/2014   Procedure: LAPAROSCOPIC CHOLECYSTECTOMY;  Surgeon: Sherri Rad, MD;  Location: [facility] ORS;  Service: General;  Laterality: N/A;    MEDICATIONS:  Prior to Admission medications   Medication Sig Start Date End Date Taking? Authorizing Provider  acetaminophen (TYLENOL) 500 MG tablet Take 500 mg by mouth. 05/17/12  Yes [provider]  albuterol (PROVENTIL HFA;VENTOLIN HFA) 108 (90 BASE) MCG/ACT inhaler Inhale 2 puffs into the lungs every 6 (six) hours as needed for wheezing or shortness of breath.    Yes [provider]  aspirin  EC 81 MG tablet Take 81 mg by mouth daily.   Yes [provider]  atorvastatin (LIPITOR) 40 MG tablet Take 40 mg by mouth daily at 6 PM.  11/16/14  Yes [provider]  cetirizine (ZYRTEC) 10 MG tablet Take by mouth daily.    Yes [provider]  Cholecalciferol (VITAMIN D3) 1000 units CAPS Take 1,000 Units by mouth daily.    Yes [provider]  diclofenac sodium (VOLTAREN) 1 % GEL Apply 2 g topically 4 (four) times daily. 04/15/16  Yes Laban Emperor, PA-C  ferrous sulfate 325 (65 FE) MG tablet Take 325 mg by mouth daily.   Yes [provider]  fluconazole (DIFLUCAN) 150 MG tablet  02/14/17  Yes [provider]  fluticasone (FLONASE) 50 MCG/ACT nasal spray Place 2 sprays into both nostrils daily as needed for allergies or rhinitis.    Yes [provider]  gabapentin (NEURONTIN) 800 MG tablet Take 800 mg by mouth 3 (three) times daily.  12/26/16  Yes [provider]  hydrochlorothiazide (HYDRODIURIL) 25 MG tablet Take 25 mg by mouth daily.   Yes [provider]  IBU 800 MG tablet  01/08/17  Yes [provider]  insulin glulisine (APIDRA) 100 UNIT/ML injection Inject into the skin See admin instructions. Continuously via pump   Yes [provider]  insulin glulisine (APIDRA) 100 UNIT/ML injection Take up to 50 units daily in insulin pump, as directed 12/14/15  Yes [provider]  ipratropium (ATROVENT HFA) 17 MCG/ACT inhaler Inhale into the lungs as needed.    Yes [provider]  lisinopril (PRINIVIL,ZESTRIL)  10 MG tablet Take 10 mg by mouth daily.   Yes [provider]  metFORMIN (GLUCOPHAGE-XR) 500 MG 24 hr tablet Take 500 mg by mouth 2 (two) times daily.   Yes [provider]  methocarbamol (ROBAXIN) 500 MG tablet Take 500 mg by mouth 2 (two) times daily.   Yes [provider]  mometasone (ELOCON) 0.1 % cream Apply 1 application topically 2 (two) times  daily as needed. 10/08/17  Yes [provider]  Multiple Vitamin (MULTI-VITAMINS) TABS Take 1 tablet by mouth daily.   Yes [provider]  mupirocin ointment (BACTROBAN) 2 % Apply to wound after soaking BID 09/26/17  Yes Hyatt, Max T, DPM  polyethylene glycol powder (GLYCOLAX/MIRALAX) powder 0.5 Containers as needed.  10/08/15  Yes [provider]  ranitidine (ZANTAC) 150 MG tablet Take 150 mg by mouth daily.  05/03/15  Yes [provider]  rosuvastatin (CRESTOR) 20 MG tablet 20 mg daily.  05/02/13  Yes [provider]    ALLERGIES:  No Known Allergies   SOCIAL HISTORY:  Social History   Socioeconomic History  . Marital status: Widowed    Spouse name: Not on file  . Number of children: 1  . Years of education: Not on file  . Highest education level: Not on file  Occupational History  . Not on file  Social Needs  . Financial resource strain: Not on file  . Food insecurity:    Worry: Not on file    Inability: Not on file  . Transportation needs:    Medical: Not on file    Non-medical: Not on file  Tobacco Use  . Smoking status: Former Smoker    Last attempt to quit: 02/21/1999    Years since quitting: 19.1  . Smokeless tobacco: Former Network engineer and Sexual Activity  . Alcohol use: No    Alcohol/week: 0.0 standard drinks  . Drug use: No  . Sexual activity: Not on file  Lifestyle  . Physical activity:    Days per week: Not on file    Minutes per session: Not on file  . Stress: Not on file  Relationships  . Social connections:    Talks on phone: Not on file    Gets together: Not on file    Attends religious service: Not on file    Active member of club or organization: Not on file    Attends meetings of clubs or organizations: Not on file    Relationship status: Not on file  . Intimate partner violence:    Fear of current or ex partner: Not on file    Emotionally abused: Not on file    Physically abused: Not on file     Forced sexual activity: Not on file  Other Topics Concern  . Not on file  Social History Narrative  . Not on file    The patient currently resides (home / rehab facility / nursing home): Home The patient normally is (ambulatory / bedbound): Ambulatory  FAMILY HISTORY:  Family History  Problem Relation Age of Onset  . Lung cancer Father     Otherwise negative/non-contributory.  REVIEW OF SYSTEMS:  Constitutional: denies any other weight loss, fever, chills, or sweats  Eyes: denies any other vision changes, history of eye injury  ENT: denies sore throat, hearing problems  Respiratory: denies shortness of breath, wheezing  Cardiovascular: denies chest pain, palpitations  Gastrointestinal: denies abdominal pain, N/V, or diarrhea Musculoskeletal: denies any other joint pains or  cramps  Skin: Denies any other rashes or skin discolorations except as per HPI Neurological: denies any other headache, dizziness, weakness  Psychiatric: Denies any other depression, anxiety   All other review of systems were otherwise negative   VITAL SIGNS:  BP (!) 143/83   Pulse (!) 102   Temp 97.9 F (36.6 C) (Temporal)   Ht 5\' 7"  (1.702 m)   Wt 134 lb 9.6 oz (61.1 kg)   SpO2 100%   BMI 21.08 kg/m    PHYSICAL EXAM:  Constitutional:  -- Normal body habitus  -- Awake, alert, and oriented x3  Eyes:  -- Pupils equally round and reactive to light  -- No scleral icterus  Ear, nose, throat:  -- No jugular venous distension -- No nasal drainage, bleeding Pulmonary:  -- No crackles  -- Equal breath sounds bilaterally -- Breathing non-labored at rest Cardiovascular:  -- S1, S2 present  -- No pericardial rubs  Gastrointestinal:  -- Abdomen soft, non-tender to palpation, non-distended, no guarding/rebound tenderness -- No abdominal masses appreciated, pulsatile or otherwise  Musculoskeletal and Integumentary:  -- Wounds or skin discoloration: Non-pulsatile easily mobile 1.5 cm tall x 1 cm wide  soft spongy non-collapsible Left antecubital subcutaneous mass -- Extremities: B/L UE and LE FROM, hands and feet warm, no edema  Neurologic:  -- Motor function: Intact and symmetric -- Sensation: Intact and symmetric  Pulse/Doppler Exam:  (p=palpable; d=doppler signals; 0=none)     Right   Left   Ax  p   p   Brach  p   p   Rad  p   p  Labs:  CBC Latest Ref Rng & Units 11/12/2016 01/01/2015 12/02/2014  WBC 3.6 - 11.0 K/uL 8.7 12.8(H) 13.9(H)  Hemoglobin 12.0 - 16.0 g/dL 12.5 11.7(L) 12.5  Hematocrit 35.0 - 47.0 % 38.4 36.9 39.4  Platelets 150 - 440 K/uL 321 400 356   CMP Latest Ref Rng & Units 09/13/2017 11/12/2016 01/01/2015  Glucose 65 - 99 mg/dL - 290(H) 177(H)  BUN 6 - 20 mg/dL - 8 10  Creatinine 0.44 - 1.00 mg/dL 1.10(H) 0.95 0.95  Sodium 135 - 145 mmol/L - 139 136  Potassium 3.5 - 5.1 mmol/L - 3.8 4.1  Chloride 101 - 111 mmol/L - 101 97(L)  CO2 22 - 32 mmol/L - 29 31  Calcium 8.9 - 10.3 mg/dL - 8.9 9.3  Total Protein 6.5 - 8.1 g/dL - 7.6 7.9  Total Bilirubin 0.3 - 1.2 mg/dL - 0.1(L) 0.3  Alkaline Phos 38 - 126 U/L - 94 114  AST 15 - 41 U/L - 44(H) 72(H)  ALT 14 - 54 U/L - 44 62(H)   Imaging studies:  Left Antecubital Ultrasound (03/21/2018) Nonspecific antecubital fossa predominantly cystic mass with peripheral vascularity measuring 2.1 x 0.8 x 1 3 cm. Some differential considerations might include a synovial or ganglion cyst of the elbow, antecubital bursitis, less likely thrombosed brachial artery aneurysm given lack of adjacent vasculature seen or necrotic lymph node. MRI with IV contrast may help for better assessment.  Assessment/Plan:  68 y.o. female with an increasingly symptomatic subcutaneous Left antecubital cystic mass, complicated by co-morbidities including DM, HTN, osteoarthritis with chronic lower back pain management by anesthesiology pain management clinic, GERD, and former chronic tobacco abuse (smoking).   - ultrasound results and differential  diagnoses discussed  - all risks, benefits, and alternatives to excision of Left antecubital subcutaneous mass were discussed with the patient, all of her questions were answered to her expressed  satisfaction, patient expresses she wishes to proceed, and informed consent was obtained.  - would typically perform in-office excision for a subcutaneous mass of this size, but considering possible (less likely) vascular association or etiology, will plan for excision either at [facility] or [facility] if appropriate considering comorbidities  - anticipate return to clinic 2 weeks after above elective procedure  - instructed to call if any questions or concerns  All of the above recommendations were discussed with the patient, and all of patient's questions were answered to her expressed satisfaction.  Thank you for the opportunity to participate in this patient's care.  -- Marilynne Drivers Rosana Hoes, MD, [place]: [facility] - Partnering for exceptional care. Office: [phone]

## 2018-04-25 NOTE — Patient Instructions (Addendum)
Patient to be schedule for excision left arm mass.   Patient's surgery has been scheduled for at [facility] with Dr. Rosana Hoes for 05/06/18. She will pre admit at the hospital.  We will call the patient with her Pre Admit appointment date and time. Patient will check in at the [facility], [address] (first floor) for this.   The patient is aware to call the office should she have further questions.

## 2018-04-25 NOTE — Progress Notes (Signed)
Surgical Clinic History and Physical  Referring provider:  Theotis Burrow, MD [facility] [place], [place] [address]  HISTORY OF PRESENT ILLNESS (HPI):  68 y.o. female presents for evaluation of her Left upper extremity subcutaneous mass. Patient reports her Left antecubital subcutaneous mass has been present x at least 1 year, which patient first noticed when a phlebotomist unsuccessfully attempted to draw blood from it. It has since enlarged periodically and then regresses without surrounding redness or drainage. She describes it hurts more when it enlarges, and she denies any prior drainage or excision procedure(s). She denies any Left elbow or Left arm trauma and denies Left upper extremity joint/bone pain despite chronic lower back pain, for which she's underwent epidural placement for pain management and similarly multiple steroid injections for her lower back pain. She denies having taken any recent systemic steroids, such as Prednisone.  PAST MEDICAL HISTORY (PMH):  Past Medical History:  Diagnosis Date  . Allergic rhinitis   . Cholelithiasis   . Diabetes type 2, controlled ([place])   . GERD (gastroesophageal reflux disease)   . Hypertension   . Neuropathy     PAST SURGICAL HISTORY ([place]):  Past Surgical History:  Procedure Laterality Date  . ABDOMINAL HYSTERECTOMY  1972  . CERVICAL FUSION    . CHOLECYSTECTOMY N/A 12/28/2014   Procedure: LAPAROSCOPIC CHOLECYSTECTOMY;  Surgeon: Sherri Rad, MD;  Location: [facility] ORS;  Service: General;  Laterality: N/A;    MEDICATIONS:  Prior to Admission medications   Medication Sig Start Date End Date Taking? Authorizing Provider  acetaminophen (TYLENOL) 500 MG tablet Take 500 mg by mouth. 05/17/12  Yes [provider]  albuterol (PROVENTIL HFA;VENTOLIN HFA) 108 (90 BASE) MCG/ACT inhaler Inhale 2 puffs into the lungs every 6 (six) hours as needed for wheezing or shortness of breath.    Yes [provider]  aspirin  EC 81 MG tablet Take 81 mg by mouth daily.   Yes [provider]  atorvastatin (LIPITOR) 40 MG tablet Take 40 mg by mouth daily at 6 PM.  11/16/14  Yes [provider]  cetirizine (ZYRTEC) 10 MG tablet Take by mouth daily.    Yes [provider]  Cholecalciferol (VITAMIN D3) 1000 units CAPS Take 1,000 Units by mouth daily.    Yes [provider]  diclofenac sodium (VOLTAREN) 1 % GEL Apply 2 g topically 4 (four) times daily. 04/15/16  Yes Laban Emperor, PA-C  ferrous sulfate 325 (65 FE) MG tablet Take 325 mg by mouth daily.   Yes [provider]  fluconazole (DIFLUCAN) 150 MG tablet  02/14/17  Yes [provider]  fluticasone (FLONASE) 50 MCG/ACT nasal spray Place 2 sprays into both nostrils daily as needed for allergies or rhinitis.    Yes [provider]  gabapentin (NEURONTIN) 800 MG tablet Take 800 mg by mouth 3 (three) times daily.  12/26/16  Yes [provider]  hydrochlorothiazide (HYDRODIURIL) 25 MG tablet Take 25 mg by mouth daily.   Yes [provider]  IBU 800 MG tablet  01/08/17  Yes [provider]  insulin glulisine (APIDRA) 100 UNIT/ML injection Inject into the skin See admin instructions. Continuously via pump   Yes [provider]  insulin glulisine (APIDRA) 100 UNIT/ML injection Take up to 50 units daily in insulin pump, as directed 12/14/15  Yes [provider]  ipratropium (ATROVENT HFA) 17 MCG/ACT inhaler Inhale into the lungs as needed.    Yes [provider]  lisinopril (PRINIVIL,ZESTRIL)  10 MG tablet Take 10 mg by mouth daily.   Yes [provider]  metFORMIN (GLUCOPHAGE-XR) 500 MG 24 hr tablet Take 500 mg by mouth 2 (two) times daily.   Yes [provider]  methocarbamol (ROBAXIN) 500 MG tablet Take 500 mg by mouth 2 (two) times daily.   Yes [provider]  mometasone (ELOCON) 0.1 % cream Apply 1 application topically 2 (two) times  daily as needed. 10/08/17  Yes [provider]  Multiple Vitamin (MULTI-VITAMINS) TABS Take 1 tablet by mouth daily.   Yes [provider]  mupirocin ointment (BACTROBAN) 2 % Apply to wound after soaking BID 09/26/17  Yes Hyatt, Max T, DPM  polyethylene glycol powder (GLYCOLAX/MIRALAX) powder 0.5 Containers as needed.  10/08/15  Yes [provider]  ranitidine (ZANTAC) 150 MG tablet Take 150 mg by mouth daily.  05/03/15  Yes [provider]  rosuvastatin (CRESTOR) 20 MG tablet 20 mg daily.  05/02/13  Yes [provider]    ALLERGIES:  No Known Allergies   SOCIAL HISTORY:  Social History   Socioeconomic History  . Marital status: Widowed    Spouse name: Not on file  . Number of children: 1  . Years of education: Not on file  . Highest education level: Not on file  Occupational History  . Not on file  Social Needs  . Financial resource strain: Not on file  . Food insecurity:    Worry: Not on file    Inability: Not on file  . Transportation needs:    Medical: Not on file    Non-medical: Not on file  Tobacco Use  . Smoking status: Former Smoker    Last attempt to quit: 02/21/1999    Years since quitting: 19.1  . Smokeless tobacco: Former Network engineer and Sexual Activity  . Alcohol use: No    Alcohol/week: 0.0 standard drinks  . Drug use: No  . Sexual activity: Not on file  Lifestyle  . Physical activity:    Days per week: Not on file    Minutes per session: Not on file  . Stress: Not on file  Relationships  . Social connections:    Talks on phone: Not on file    Gets together: Not on file    Attends religious service: Not on file    Active member of club or organization: Not on file    Attends meetings of clubs or organizations: Not on file    Relationship status: Not on file  . Intimate partner violence:    Fear of current or ex partner: Not on file    Emotionally abused: Not on file    Physically abused: Not on file     Forced sexual activity: Not on file  Other Topics Concern  . Not on file  Social History Narrative  . Not on file    The patient currently resides (home / rehab facility / nursing home): Home The patient normally is (ambulatory / bedbound): Ambulatory  FAMILY HISTORY:  Family History  Problem Relation Age of Onset  . Lung cancer Father     Otherwise negative/non-contributory.  REVIEW OF SYSTEMS:  Constitutional: denies any other weight loss, fever, chills, or sweats  Eyes: denies any other vision changes, history of eye injury  ENT: denies sore throat, hearing problems  Respiratory: denies shortness of breath, wheezing  Cardiovascular: denies chest pain, palpitations  Gastrointestinal: denies abdominal pain, N/V, or diarrhea Musculoskeletal: denies any other joint pains or  cramps  Skin: Denies any other rashes or skin discolorations except as per HPI Neurological: denies any other headache, dizziness, weakness  Psychiatric: Denies any other depression, anxiety   All other review of systems were otherwise negative   VITAL SIGNS:  BP (!) 143/83   Pulse (!) 102   Temp 97.9 F (36.6 C) (Temporal)   Ht 5\' 7"  (1.702 m)   Wt 134 lb 9.6 oz (61.1 kg)   SpO2 100%   BMI 21.08 kg/m    PHYSICAL EXAM:  Constitutional:  -- Normal body habitus  -- Awake, alert, and oriented x3  Eyes:  -- Pupils equally round and reactive to light  -- No scleral icterus  Ear, nose, throat:  -- No jugular venous distension -- No nasal drainage, bleeding Pulmonary:  -- No crackles  -- Equal breath sounds bilaterally -- Breathing non-labored at rest Cardiovascular:  -- S1, S2 present  -- No pericardial rubs  Gastrointestinal:  -- Abdomen soft, non-tender to palpation, non-distended, no guarding/rebound tenderness -- No abdominal masses appreciated, pulsatile or otherwise  Musculoskeletal and Integumentary:  -- Wounds or skin discoloration: Non-pulsatile easily mobile 1.5 cm tall x 1 cm wide  soft spongy non-collapsible Left antecubital subcutaneous mass -- Extremities: B/L UE and LE FROM, hands and feet warm, no edema  Neurologic:  -- Motor function: Intact and symmetric -- Sensation: Intact and symmetric  Pulse/Doppler Exam:  (p=palpable; d=doppler signals; 0=none)     Right   Left   Ax  p   p   Brach  p   p   Rad  p   p  Labs:  CBC Latest Ref Rng & Units 11/12/2016 01/01/2015 12/02/2014  WBC 3.6 - 11.0 K/uL 8.7 12.8(H) 13.9(H)  Hemoglobin 12.0 - 16.0 g/dL 12.5 11.7(L) 12.5  Hematocrit 35.0 - 47.0 % 38.4 36.9 39.4  Platelets 150 - 440 K/uL 321 400 356   CMP Latest Ref Rng & Units 09/13/2017 11/12/2016 01/01/2015  Glucose 65 - 99 mg/dL - 290(H) 177(H)  BUN 6 - 20 mg/dL - 8 10  Creatinine 0.44 - 1.00 mg/dL 1.10(H) 0.95 0.95  Sodium 135 - 145 mmol/L - 139 136  Potassium 3.5 - 5.1 mmol/L - 3.8 4.1  Chloride 101 - 111 mmol/L - 101 97(L)  CO2 22 - 32 mmol/L - 29 31  Calcium 8.9 - 10.3 mg/dL - 8.9 9.3  Total Protein 6.5 - 8.1 g/dL - 7.6 7.9  Total Bilirubin 0.3 - 1.2 mg/dL - 0.1(L) 0.3  Alkaline Phos 38 - 126 U/L - 94 114  AST 15 - 41 U/L - 44(H) 72(H)  ALT 14 - 54 U/L - 44 62(H)   Imaging studies:  Left Antecubital Ultrasound (03/21/2018) Nonspecific antecubital fossa predominantly cystic mass with peripheral vascularity measuring 2.1 x 0.8 x 1 3 cm. Some differential considerations might include a synovial or ganglion cyst of the elbow, antecubital bursitis, less likely thrombosed brachial artery aneurysm given lack of adjacent vasculature seen or necrotic lymph node. MRI with IV contrast may help for better assessment.  Assessment/Plan:  68 y.o. female with an increasingly symptomatic subcutaneous Left antecubital cystic mass, complicated by co-morbidities including DM, HTN, osteoarthritis with chronic lower back pain management by anesthesiology pain management clinic, GERD, and former chronic tobacco abuse (smoking).   - ultrasound results and differential  diagnoses discussed  - all risks, benefits, and alternatives to excision of Left antecubital subcutaneous mass were discussed with the patient, all of her questions were answered to her expressed  satisfaction, patient expresses she wishes to proceed, and informed consent was obtained.  - would typically perform in-office excision for a subcutaneous mass of this size, but considering possible (less likely) vascular association or etiology, will plan for excision either at [facility] or [place] if appropriate considering comorbidities  - anticipate return to clinic 2 weeks after above elective procedure  - instructed to call if any questions or concerns  All of the above recommendations were discussed with the patient, and all of patient's questions were answered to her expressed satisfaction.  Thank you for the opportunity to participate in this patient's care.  -- Marilynne Drivers Rosana Hoes, MD, [place]: [facility] - Partnering for exceptional care. Office: [phone]

## 2018-04-26 ENCOUNTER — Telehealth: Payer: Self-pay

## 2018-04-26 NOTE — Telephone Encounter (Signed)
Patient's surgery has been scheduled for at [facility] with Dr. Rosana Hoes for 05/06/18  The patient is aware to Pre-Admit on 05/02/18 at 8:00 am. Patient will check in at the [facility], [address] (first floor).   The patient is aware to call the office should he/she have further questions.

## 2018-04-30 ENCOUNTER — Other Ambulatory Visit: Payer: Self-pay | Admitting: Family Medicine

## 2018-04-30 DIAGNOSIS — Z1231 Encounter for screening mammogram for malignant neoplasm of breast: Secondary | ICD-10-CM

## 2018-05-02 ENCOUNTER — Other Ambulatory Visit: Payer: Self-pay

## 2018-05-02 ENCOUNTER — Encounter
Admission: RE | Admit: 2018-05-02 | Discharge: 2018-05-02 | Disposition: A | Discharge status: Home / Self Care | Payer: Medicare PPO | Source: Ambulatory Visit | Attending: Vascular Surgery | Admitting: Vascular Surgery

## 2018-05-02 DIAGNOSIS — Z01810 Encounter for preprocedural cardiovascular examination: Secondary | ICD-10-CM

## 2018-05-02 DIAGNOSIS — Z01812 Encounter for preprocedural laboratory examination: Secondary | ICD-10-CM | POA: Diagnosis present

## 2018-05-02 LAB — BASIC METABOLIC PANEL
Anion gap: 9 (ref 5–15)
BUN: 12 mg/dL (ref 8–23)
CHLORIDE: 104 mmol/L (ref 98–111)
CO2: 23 mmol/L (ref 22–32)
Calcium: 9.1 mg/dL (ref 8.9–10.3)
Creatinine, Ser: 1.06 mg/dL — ABNORMAL HIGH (ref 0.44–1.00)
GFR calc Af Amer: >60 mL/min (ref >60)
GFR calc non Af Amer: 54 mL/min — ABNORMAL LOW (ref >60)
Glucose, Bld: 234 mg/dL — ABNORMAL HIGH (ref 70–99)
Potassium: 3.8 mmol/L (ref 3.5–5.1)
Sodium: 136 mmol/L (ref 135–145)

## 2018-05-02 LAB — CBC WITH DIFFERENTIAL/PLATELET
Abs Immature Granulocytes: 0.03 10*3/uL (ref 0.00–0.07)
Basophils Absolute: 0.1 10*3/uL (ref 0.0–0.1)
Basophils Relative: 1 %
Eosinophils Absolute: 0.1 10*3/uL (ref 0.0–0.5)
Eosinophils Relative: 1 %
HCT: 36.1 % (ref 36.0–46.0)
HEMOGLOBIN: 11.4 g/dL — AB (ref 12.0–15.0)
Immature Granulocytes: 0 %
Lymphocytes Relative: 39 %
Lymphs Abs: 4.6 10*3/uL — ABNORMAL HIGH (ref 0.7–4.0)
MCH: 25.2 pg — ABNORMAL LOW (ref 26.0–34.0)
MCHC: 31.6 g/dL (ref 30.0–36.0)
MCV: 79.9 fL — ABNORMAL LOW (ref 80.0–100.0)
Monocytes Absolute: 1.1 10*3/uL — ABNORMAL HIGH (ref 0.1–1.0)
Monocytes Relative: 9 %
Neutro Abs: 6 10*3/uL (ref 1.7–7.7)
Neutrophils Relative %: 50 %
Platelets: 379 10*3/uL (ref 150–400)
RBC: 4.52 MIL/uL (ref 3.87–5.11)
RDW: 14 % (ref 11.5–15.5)
WBC: 12 10*3/uL — ABNORMAL HIGH (ref 4.0–10.5)
nRBC: 0 % (ref 0.0–0.2)

## 2018-05-02 NOTE — Patient Instructions (Addendum)
Your procedure is scheduled JE:HUDJSH 3/16 Report to Day Surgery. To find out your arrival time please call [phone] between 1PM - 3PM on Friday 3/13.  Remember: Instructions that are not followed completely may result in serious medical risk,  up to and including death, or upon the discretion of your surgeon and anesthesiologist your  surgery may need to be rescheduled.     _X__ 1. Do not eat food after midnight the night before your procedure.                 No gum chewing or hard candies. You may drink clear liquids up to 2 hours                 before you are scheduled to arrive for your surgery- DO not drink clear                 liquids within 2 hours of the start of your surgery.                 Clear Liquids include:  water,  __X__2.  On the morning of surgery brush your teeth with toothpaste and water, you                may rinse your mouth with mouthwash if you wish.  Do not swallow any toothpaste of mouthwash.     ___ 3.  No Alcohol for 24 hours before or after surgery.   ___ 4.  Do Not Smoke or use e-cigarettes For 24 Hours Prior to Your Surgery.                 Do not use any chewable tobacco products for at least 6 hours prior to                 surgery.  ____  5.  Bring all medications with you on the day of surgery if instructed.   __x__  6.  Notify your doctor if there is any change in your medical condition      (cold, fever, infections).     Do not wear jewelry, make-up, hairpins, clips or nail polish. Do not wear lotions, powders, or perfumes. You may wear deodorant. Do not shave 48 hours prior to surgery. Men may shave face and neck. Do not bring valuables to the hospital.    [facility] is not responsible for any belongings or valuables.  Contacts, dentures or bridgework may not be worn into surgery. Leave your suitcase in the car. After surgery it may be brought to your room. For patients admitted to the hospital, discharge  time is determined by your treatment team.   Patients discharged the day of surgery will not be allowed to drive home.   Please read over the following fact sheets that you were given:  x  ____ Take these medicines the morning of surgery with A SIP OF WATER:    1. ranitidine (ZANTAC) 150 MG tablet take extra dose the night before and one the morning of surgery  2. gabapentin (NEURONTIN) 800 MG tablet  3. methocarbamol (ROBAXIN) 500 MG tablet if needed  4.  5.  6.  ____ Fleet Enema (as directed)   __x__ Use CHG Soap as directed  __x__ Use inhalers on the day of surgery albuterol (PROVENTIL HFA;VENTOLIN HFA) 108 (90 BASE) MCG/ACT inhaler and bring with you ipratropium (ATROVENT HFA) 17 MCG/ACT inhaler  __x__ Stop metformin 2 days prior to surgery Last dose  on Friday 3/13    __x__ Keep basal dose on your insulin pump only but no bolus doses.   __x__ Stop aspirin today  __x__ Stop Anti-inflammatories IBU 800 MG tablet or Aleve today.    May take Tylenol   ____ Stop supplements until after surgery.    ____ Bring C-Pap to the hospital.

## 2018-05-03 NOTE — Pre-Procedure Instructions (Signed)
CBC and Met B results sent to Dr. Rosana Hoes for review.

## 2018-05-05 MED ORDER — CEFAZOLIN SODIUM-DEXTROSE 2-4 GM/100ML-% IV SOLN
2.0000 g | INTRAVENOUS | Status: AC
  Administered 2018-05-06: 2 g via INTRAVENOUS

## 2018-05-06 ENCOUNTER — Telehealth: Payer: Self-pay | Admitting: *Deleted

## 2018-05-06 ENCOUNTER — Encounter: Payer: Self-pay | Admitting: *Deleted

## 2018-05-06 ENCOUNTER — Encounter
Admission: RE | Disposition: A | Discharge status: Home / Self Care | Payer: Self-pay | Source: Home / Self Care | Attending: Surgery

## 2018-05-06 ENCOUNTER — Other Ambulatory Visit: Payer: Self-pay

## 2018-05-06 ENCOUNTER — Ambulatory Visit: Payer: Medicare PPO | Admitting: Anesthesiology

## 2018-05-06 ENCOUNTER — Ambulatory Visit
Admission: RE | Admit: 2018-05-06 | Discharge: 2018-05-06 | Disposition: A | Discharge status: Home / Self Care | Payer: Medicare PPO | Attending: Surgery | Admitting: Surgery

## 2018-05-06 DIAGNOSIS — K219 Gastro-esophageal reflux disease without esophagitis: Secondary | ICD-10-CM | POA: Insufficient documentation

## 2018-05-06 DIAGNOSIS — I729 Aneurysm of unspecified site: Secondary | ICD-10-CM | POA: Diagnosis not present

## 2018-05-06 DIAGNOSIS — R229 Localized swelling, mass and lump, unspecified: Secondary | ICD-10-CM | POA: Insufficient documentation

## 2018-05-06 DIAGNOSIS — D367 Benign neoplasm of other specified sites: Secondary | ICD-10-CM

## 2018-05-06 DIAGNOSIS — Z9641 Presence of insulin pump (external) (internal): Secondary | ICD-10-CM | POA: Insufficient documentation

## 2018-05-06 DIAGNOSIS — Z791 Long term (current) use of non-steroidal anti-inflammatories (NSAID): Secondary | ICD-10-CM | POA: Insufficient documentation

## 2018-05-06 DIAGNOSIS — Z79899 Other long term (current) drug therapy: Secondary | ICD-10-CM | POA: Diagnosis not present

## 2018-05-06 DIAGNOSIS — I878 Other specified disorders of veins: Secondary | ICD-10-CM

## 2018-05-06 DIAGNOSIS — I10 Essential (primary) hypertension: Secondary | ICD-10-CM | POA: Diagnosis not present

## 2018-05-06 DIAGNOSIS — I82612 Acute embolism and thrombosis of superficial veins of left upper extremity: Secondary | ICD-10-CM | POA: Diagnosis not present

## 2018-05-06 DIAGNOSIS — E1140 Type 2 diabetes mellitus with diabetic neuropathy, unspecified: Secondary | ICD-10-CM | POA: Insufficient documentation

## 2018-05-06 DIAGNOSIS — Z7982 Long term (current) use of aspirin: Secondary | ICD-10-CM | POA: Insufficient documentation

## 2018-05-06 DIAGNOSIS — Z87891 Personal history of nicotine dependence: Secondary | ICD-10-CM | POA: Diagnosis not present

## 2018-05-06 HISTORY — PX: MASS EXCISION: SHX2000

## 2018-05-06 LAB — GLUCOSE, CAPILLARY
Glucose-Capillary: 190 mg/dL — ABNORMAL HIGH (ref 70–99)
Glucose-Capillary: 181 mg/dL — ABNORMAL HIGH (ref 70–99)

## 2018-05-06 SURGERY — EXCISION MASS
Anesthesia: General | Laterality: Left

## 2018-05-06 MED ORDER — CEFAZOLIN SODIUM-DEXTROSE 2-4 GM/100ML-% IV SOLN
INTRAVENOUS | Status: AC
  Filled 2018-05-06: qty 100

## 2018-05-06 MED ORDER — PROPOFOL 10 MG/ML IV BOLUS
INTRAVENOUS | Status: AC
  Filled 2018-05-06: qty 20

## 2018-05-06 MED ORDER — BACITRACIN ZINC 500 UNIT/GM EX OINT
TOPICAL_OINTMENT | CUTANEOUS | Status: AC
  Filled 2018-05-06: qty 28.35

## 2018-05-06 MED ORDER — PROPOFOL 10 MG/ML IV BOLUS
INTRAVENOUS | Status: DC | PRN
  Administered 2018-05-06: 80 mg via INTRAVENOUS

## 2018-05-06 MED ORDER — DEXAMETHASONE SODIUM PHOSPHATE 10 MG/ML IJ SOLN
INTRAMUSCULAR | Status: DC | PRN
  Administered 2018-05-06: 10 mg via INTRAVENOUS

## 2018-05-06 MED ORDER — FENTANYL CITRATE (PF) 100 MCG/2ML IJ SOLN: 25.0000 ug | INTRAMUSCULAR | Status: DC | PRN

## 2018-05-06 MED ORDER — LIDOCAINE HCL (PF) 1 % IJ SOLN
INTRAMUSCULAR | Status: AC
  Filled 2018-05-06: qty 30

## 2018-05-06 MED ORDER — LIDOCAINE HCL (PF) 1 % IJ SOLN
INTRAMUSCULAR | Status: DC | PRN
  Administered 2018-05-06: 5 mL

## 2018-05-06 MED ORDER — CHLORHEXIDINE GLUCONATE CLOTH 2 % EX PADS: 6.0000 | MEDICATED_PAD | Freq: Once | CUTANEOUS | Status: DC

## 2018-05-06 MED ORDER — PHENYLEPHRINE HCL 10 MG/ML IJ SOLN
INTRAMUSCULAR | Status: DC | PRN
  Administered 2018-05-06 (×7): 100 ug via INTRAVENOUS

## 2018-05-06 MED ORDER — ACETAMINOPHEN 500 MG PO TABS
1000.0000 mg | ORAL_TABLET | ORAL | Status: AC
  Administered 2018-05-06: 1000 mg via ORAL

## 2018-05-06 MED ORDER — BUPIVACAINE HCL (PF) 0.5 % IJ SOLN
INTRAMUSCULAR | Status: AC
  Filled 2018-05-06: qty 30

## 2018-05-06 MED ORDER — FENTANYL CITRATE (PF) 100 MCG/2ML IJ SOLN
INTRAMUSCULAR | Status: DC | PRN
  Administered 2018-05-06 (×2): 50 ug via INTRAVENOUS

## 2018-05-06 MED ORDER — LIDOCAINE HCL (CARDIAC) PF 100 MG/5ML IV SOSY
PREFILLED_SYRINGE | INTRAVENOUS | Status: DC | PRN
  Administered 2018-05-06: 100 mg via INTRAVENOUS

## 2018-05-06 MED ORDER — ONDANSETRON HCL 4 MG/2ML IJ SOLN: 4.0000 mg | Freq: Once | INTRAMUSCULAR | Status: DC | PRN

## 2018-05-06 MED ORDER — MIDAZOLAM HCL 2 MG/2ML IJ SOLN
INTRAMUSCULAR | Status: AC
  Filled 2018-05-06: qty 2

## 2018-05-06 MED ORDER — CHLORHEXIDINE GLUCONATE CLOTH 2 % EX PADS
6.0000 | MEDICATED_PAD | Freq: Once | CUTANEOUS | Status: AC
  Administered 2018-05-06: 6 via TOPICAL

## 2018-05-06 MED ORDER — ONDANSETRON HCL 4 MG/2ML IJ SOLN
INTRAMUSCULAR | Status: DC | PRN
  Administered 2018-05-06: 4 mg via INTRAVENOUS

## 2018-05-06 MED ORDER — MIDAZOLAM HCL 2 MG/2ML IJ SOLN
INTRAMUSCULAR | Status: DC | PRN
  Administered 2018-05-06: 2 mg via INTRAVENOUS

## 2018-05-06 MED ORDER — SODIUM CHLORIDE 0.9 % IV SOLN
INTRAVENOUS | Status: DC
  Administered 2018-05-06: 07:00:00 via INTRAVENOUS

## 2018-05-06 MED ORDER — ACETAMINOPHEN 500 MG PO TABS
ORAL_TABLET | ORAL | Status: AC
  Administered 2018-05-06: 1000 mg via ORAL
  Filled 2018-05-06: qty 2

## 2018-05-06 MED ORDER — FENTANYL CITRATE (PF) 100 MCG/2ML IJ SOLN
INTRAMUSCULAR | Status: AC
  Filled 2018-05-06: qty 2

## 2018-05-06 MED ORDER — BUPIVACAINE HCL (PF) 0.5 % IJ SOLN
INTRAMUSCULAR | Status: DC | PRN
  Administered 2018-05-06: 5 mL

## 2018-05-06 SURGICAL SUPPLY — 32 items
BLADE SURG 15 STRL LF DISP TIS (BLADE) ×1 IMPLANT
BLADE SURG 15 STRL SS (BLADE) ×2
CHLORAPREP W/TINT 26 (MISCELLANEOUS) ×3 IMPLANT
COVER WAND RF STERILE (DRAPES) ×3 IMPLANT
DERMABOND ADVANCED (GAUZE/BANDAGES/DRESSINGS) ×2
DERMABOND ADVANCED .7 DNX12 (GAUZE/BANDAGES/DRESSINGS) ×1 IMPLANT
DRAPE LAPAROTOMY 100X77 ABD (DRAPES) ×3 IMPLANT
DRSG TEGADERM 4X4.75 (GAUZE/BANDAGES/DRESSINGS) ×3 IMPLANT
ELECT REM PT RETURN 9FT ADLT (ELECTROSURGICAL) ×3
ELECTRODE REM PT RTRN 9FT ADLT (ELECTROSURGICAL) ×1 IMPLANT
GLOVE BIO SURGEON STRL SZ7 (GLOVE) ×3 IMPLANT
GLOVE INDICATOR 7.5 STRL GRN (GLOVE) ×3 IMPLANT
GOWN STRL REUS W/ TWL LRG LVL3 (GOWN DISPOSABLE) ×2 IMPLANT
GOWN STRL REUS W/TWL LRG LVL3 (GOWN DISPOSABLE) ×4
KIT TURNOVER KIT A (KITS) ×3 IMPLANT
LABEL OR SOLS (LABEL) ×3 IMPLANT
NEEDLE HYPO 25X1 1.5 SAFETY (NEEDLE) ×3 IMPLANT
NS IRRIG 500ML POUR BTL (IV SOLUTION) ×3 IMPLANT
PACK BASIN MINOR ARMC (MISCELLANEOUS) ×3 IMPLANT
SPONGE GAUZE 2X2 8PLY STER LF (GAUZE/BANDAGES/DRESSINGS) ×1
SPONGE GAUZE 2X2 8PLY STRL LF (GAUZE/BANDAGES/DRESSINGS) ×2 IMPLANT
SUT ETHILON 3-0 FS-10 30 BLK (SUTURE)
SUT MNCRL 4-0 (SUTURE) ×2
SUT MNCRL 4-0 27XMFL (SUTURE) ×1
SUT SILK 2 0 (SUTURE) ×2
SUT SILK 2-0 30XBRD TIE 12 (SUTURE) ×1 IMPLANT
SUT VIC AB 3-0 SH 27 (SUTURE) ×2
SUT VIC AB 3-0 SH 27X BRD (SUTURE) ×1 IMPLANT
SUTURE EHLN 3-0 FS-10 30 BLK (SUTURE) IMPLANT
SUTURE MNCRL 4-0 27XMF (SUTURE) ×1 IMPLANT
SYR 10ML LL (SYRINGE) ×3 IMPLANT
SYR BULB 3OZ (MISCELLANEOUS) ×3 IMPLANT

## 2018-05-06 NOTE — Anesthesia Postprocedure Evaluation (Signed)
Anesthesia Post Note  Patient: Audrey Chan  Procedure(s) Performed: EXCISION OF LEFT ANTECUBITAL CYST, DIABETIC (Left )  Patient location during evaluation: PACU Anesthesia Type: General Level of consciousness: awake and alert Pain management: pain level controlled Vital Signs Assessment: post-procedure vital signs reviewed and stable Respiratory status: spontaneous breathing and respiratory function stable Cardiovascular status: stable Anesthetic complications: no     Last Vitals:  Vitals:   05/06/18 0925 05/06/18 0944  BP: (!) 151/76 (!) 153/70  Pulse: 82 69  Resp: 16 16  Temp: (!) 36.2 C (!) 36.2 C  SpO2: 95% 98%    Last Pain:  Vitals:   05/06/18 0944  TempSrc: Temporal  PainSc: 0-No pain                 KEPHART,WILLIAM K

## 2018-05-06 NOTE — Anesthesia Procedure Notes (Signed)
Procedure Name: LMA Insertion Date/Time: 05/06/2018 7:55 AM Performed by: Nelda Marseille, CRNA Pre-anesthesia Checklist: Patient identified, Patient being monitored, Timeout performed, Emergency Drugs available and Suction available Patient Re-evaluated:Patient Re-evaluated prior to induction Oxygen Delivery Method: Circle system utilized Preoxygenation: Pre-oxygenation with 100% oxygen Induction Type: IV induction Ventilation: Mask ventilation without difficulty LMA: LMA inserted Tube type: Oral Number of attempts: 1 Placement Confirmation: positive ETCO2 and breath sounds checked- equal and bilateral Tube secured with: Tape Dental Injury: Teeth and Oropharynx as per pre-operative assessment

## 2018-05-06 NOTE — Anesthesia Preprocedure Evaluation (Signed)
Anesthesia Evaluation  Patient identified by MRN, date of birth, ID band Patient awake    Reviewed: Allergy & Precautions, NPO status , Patient's Chart, lab work & pertinent test results  History of Anesthesia Complications Negative for: history of anesthetic complications  Airway Mallampati: II       Dental  (+) Partial Lower, Partial Upper   Pulmonary neg sleep apnea, COPD,  COPD inhaler, former smoker,           Cardiovascular hypertension, Pt. on medications (-) Past MI and (-) CHF (-) dysrhythmias (-) Valvular Problems/Murmurs     Neuro/Psych neg Seizures    GI/Hepatic Neg liver ROS, GERD  Medicated and Controlled,  Endo/Other  diabetes (insulin pump), Type 2, Oral Hypoglycemic Agents, Insulin Dependent  Renal/GU negative Renal ROS     Musculoskeletal   Abdominal   Peds  Hematology   Anesthesia Other Findings   Reproductive/Obstetrics                             Anesthesia Physical Anesthesia Plan  ASA: III  Anesthesia Plan: General   Post-op Pain Management:    Induction: Intravenous  PONV Risk Score and Plan: 3 and Dexamethasone, Ondansetron and Treatment may vary due to age or medical condition  Airway Management Planned: LMA  Additional Equipment:   Intra-op Plan:   Post-operative Plan:   Informed Consent: I have reviewed the patients History and Physical, chart, labs and discussed the procedure including the risks, benefits and alternatives for the proposed anesthesia with the patient or authorized representative who has indicated his/her understanding and acceptance.       Plan Discussed with:   Anesthesia Plan Comments:         Anesthesia Quick Evaluation

## 2018-05-06 NOTE — Discharge Instructions (Addendum)
Diet: Resume home heart healthy diet.   Activity: No heavy lifting >15 - 20 pounds (children, pets, laundry, garbage) or strenuous activity until follow-up in 2 weeks, but light activity and walking are encouraged. Do not drive or drink alcohol if taking narcotic pain medications.  Wound care: No Left upper extremity blood draws or IV's. 2 days after surgery (Wednesday, 3/18), you may shower/get incision wet with soapy water and pat dry (do not rub incisions), but no baths or submerging incision underwater until follow-up.   Medications: Resume all home medications. For mild to moderate pain: acetaminophen (Tylenol) or ibuprofen/naproxen (if no kidney disease). Combining Tylenol with alcohol can substantially increase your risk of causing liver disease. Narcotic pain medications, if prescribed, can be used for severe pain, though may cause nausea, constipation, and drowsiness. Do not combine Tylenol and Percocet (or similar) within a 6 hour period as Percocet (and similar) contain(s) Tylenol. If you do not need the narcotic pain medication, you do not need to fill the prescription.  Call office [phone]) at any time if any questions, worsening pain, fevers/chills, bleeding, drainage from incision site, or other concerns.  AMBULATORY SURGERY  DISCHARGE INSTRUCTIONS   1) The drugs that you were given will stay in your system until tomorrow so for the next 24 hours you should not:  A) Drive an automobile B) Make any legal decisions C) Drink any alcoholic beverage   2) You may resume regular meals tomorrow.  Today it is better to start with liquids and gradually work up to solid foods.  You may eat anything you prefer, but it is better to start with liquids, then soup and crackers, and gradually work up to solid foods.   3) Please notify your doctor immediately if you have any unusual bleeding, trouble breathing, redness and pain at the surgery site, drainage, fever, or pain not relieved  by medication.    4) Additional Instructions:  Please contact your physician with any problems or Same Day Surgery at [phone], Monday through Friday 6 am to 4 pm, or [place] at [facility] number at [phone].

## 2018-05-06 NOTE — Transfer of Care (Signed)
Immediate Anesthesia Transfer of Care Note  Patient: Audrey Chan  Procedure(s) Performed: EXCISION OF LEFT ANTECUBITAL CYST, DIABETIC (Left )  Patient Location: PACU  Anesthesia Type:General  Level of Consciousness: sedated  Airway & Oxygen Therapy: Patient Spontanous Breathing and Patient connected to face mask oxygen  Post-op Assessment: Report given to RN and Post -op Vital signs reviewed and stable  Post vital signs: Reviewed  Last Vitals:  Vitals Value Taken Time  BP 105/59 05/06/2018  8:51 AM  Temp    Pulse 70 05/06/2018  8:52 AM  Resp 15 05/06/2018  8:52 AM  SpO2 100 % 05/06/2018  8:52 AM  Vitals shown include unvalidated device data.  Last Pain:  Vitals:   05/06/18 0608  TempSrc: Tympanic  PainSc: 0-No pain         Complications: No apparent anesthesia complications

## 2018-05-06 NOTE — Interval H&P Note (Signed)
History and Physical Interval Note:  05/06/2018 7:27 AM  Audrey Chan  has presented today for surgery, with the diagnosis of LEFT ANTECUBITAL CYST.  The various methods of treatment have been discussed with the patient and family. After consideration of risks, benefits and other options for treatment, the patient has consented to  Procedure(s): EXCISION OF LEFT ANTECUBITAL CYST, DIABETIC (Left) as a surgical intervention.  The patient's history has been reviewed, patient examined, no change in status, stable for surgery.  I have reviewed the patient's chart and labs.  Questions were answered to the patient's satisfaction.     Vickie Epley

## 2018-05-06 NOTE — Telephone Encounter (Signed)
Patient called and needs a back to work note. Please call and advise

## 2018-05-06 NOTE — Anesthesia Post-op Follow-up Note (Signed)
Anesthesia QCDR form completed.        

## 2018-05-06 NOTE — Op Note (Signed)
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 (icd-10's: I72.9, I87.8)  PROCEDURE(S):  1.) Ligation and excision of increasingly painful and enlarged thrombosed Left antecubital venous aneurysm (cpt: 35206)  INTRAOPERATIVE FINDINGS: Thrombosed Left antecubital venous aneurysm  INTRAVENOUS FLUIDS: 600 mL crystalloid   ESTIMATED BLOOD LOSS: Minimal (<10 mL)  URINE OUTPUT: No Foley catheter   SPECIMENS: Thrombosed Left antecubital venous aneurysm  IMPLANTS: None  DRAINS: none  COMPLICATIONS: None apparent  CONDITION AT END OF PROCEDURE: Hemodynamically stable and extubated  PULSE/DOPPLER EXAM AT END OF PROCEDURE:   (p=palpable; d=doppler signals; 0=none)     Right   Left   Ax  p   p   Brach  p   p   Rad  p   p   Uln  p   p  DISPOSITION OF PATIENT: PACU  INDICATIONS FOR PROCEDURE:  Patient is a 68 y.o. female who presented to outpatient surgical office for increasingly painful Left antecubital subcutaneous mass. She described that >1 year ago a phlebotomist made several unsuccessful attempts to draw blood from her Left arm, since which she has experienced increasing Left antecubital pain and during which time her Left antecubital mass has increased in size and recently became more painful despite chronic pain management for her lower back pain. She accordingly requested that her subcutaneous mass be excised to alleviate her pain. Ultrasound suggested subcutaneous cyst vs brachial artery aneurysm, and the firm mobile subcutaneous mass was confirmed to be non-pulsatile with unchanged radial and ulnar pulses unchanged despite compression of  the mass and proximal/distal to the mass. All risks, benefits, and alternatives to above procedure were discussed with the patient, all of patient's questions were answered to her expressed satisfaction, and informed consent was obtained and documented.  DETAILS OF PROCEDURE: Patient was brought to the operating suite and appropriately identified. General anesthesia was administered along with appropriate pre-operative antibiotics, and LMA was placed by anesthetist. In supine position, operative site was prepped and draped in the usual sterile fashion, and following a brief time out, a 1 cm transverse antecubital incision was made using a #15 blade scalpel, Metzenbaum scissors were used to dissect surrounding the subcutaneous mass, and selective focal superficial electrocautery was utilized for hemostasis. Upon direct visualization, the firm tender to palpation (prior to anesthesia) subcutaneous mass was confirmed to be a thrombosed Left antecubital venous aneurysm. Proximal and distal cephalic and basilic branches were clearly identified and controlled, and distal radial and ulnar pulses were confirmed to be unchanged. Cephalic and basilic branches were then ligated using 2-0 silk suture ties, and the symptomatic thrombosed antecubital venous aneurysm was excised. Hemostasis was once more confirmed, and skin was re-approximated using buried interrupted 3-0 Vicryl suture for dermis. Surrounding skin was then cleaned, dried, and sterile Dermabond skin glue was applied and allowed to dry. Distal radial and ulnar pulses were then once more confirmed to be unchanged and easily palpable.  Patient was then safely able to be extubated, awakened, and transferred to PACU for post-operative monitoring and care.  I was present for all aspects of the above procedure, and no operative complications were apparent.

## 2018-05-07 ENCOUNTER — Other Ambulatory Visit: Payer: Self-pay

## 2018-05-07 ENCOUNTER — Telehealth: Payer: Self-pay | Admitting: Surgery

## 2018-05-07 ENCOUNTER — Ambulatory Visit
Admission: RE | Admit: 2018-05-07 | Discharge: 2018-05-07 | Disposition: A | Discharge status: Home / Self Care | Payer: Medicare PPO | Source: Ambulatory Visit | Attending: Family Medicine | Admitting: Family Medicine

## 2018-05-07 DIAGNOSIS — Z1231 Encounter for screening mammogram for malignant neoplasm of breast: Secondary | ICD-10-CM | POA: Insufficient documentation

## 2018-05-07 LAB — SURGICAL PATHOLOGY

## 2018-05-07 NOTE — Telephone Encounter (Signed)
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

## 2018-05-07 NOTE — Telephone Encounter (Signed)
Patient called and is asking for one of the nurses to give her a call patient has some questions post op and what she needs to do. Please call patient and advise.

## 2018-05-16 ENCOUNTER — Telehealth: Payer: Self-pay | Admitting: *Deleted

## 2018-05-16 ENCOUNTER — Other Ambulatory Visit: Payer: Self-pay | Admitting: Anesthesiology

## 2018-05-16 ENCOUNTER — Ambulatory Visit: Payer: Medicare PPO | Admitting: Anesthesiology

## 2018-05-20 DIAGNOSIS — I729 Aneurysm of unspecified site: Secondary | ICD-10-CM

## 2018-05-20 DIAGNOSIS — I878 Other specified disorders of veins: Secondary | ICD-10-CM

## 2018-05-21 ENCOUNTER — Encounter: Payer: Medicare PPO | Admitting: Surgery

## 2018-05-23 ENCOUNTER — Encounter: Payer: Medicare PPO | Admitting: Surgery

## 2018-05-24 ENCOUNTER — Other Ambulatory Visit: Payer: Self-pay

## 2018-05-24 ENCOUNTER — Encounter: Payer: Self-pay | Admitting: Surgery

## 2018-05-24 ENCOUNTER — Telehealth (INDEPENDENT_AMBULATORY_CARE_PROVIDER_SITE_OTHER): Payer: Medicare PPO | Admitting: Surgery

## 2018-05-24 DIAGNOSIS — Z4889 Encounter for other specified surgical aftercare: Secondary | ICD-10-CM

## 2018-05-24 DIAGNOSIS — I729 Aneurysm of unspecified site: Secondary | ICD-10-CM

## 2018-05-24 DIAGNOSIS — I878 Other specified disorders of veins: Secondary | ICD-10-CM

## 2018-05-24 NOTE — Progress Notes (Signed)
Referring provider:  Theotis Burrow, MD [address] [place], [place] [address]  Virtual Visit via Telephone Note  I connected with Audrey Chan by telephone at their home on 05/24/18 at 11:00 AM EDT and verified that I was speaking with the correct person using their name and date of birth.   I discussed the limitations, risks, security and privacy concerns of performing an evaluation and management service by telephone telemedicine and the availability of in person appointments. I also discussed with the patient that there may be a patient responsible charge related to this service. The patient expressed understanding and agreed to proceed.  History of Present Illness: 69 y.o. Female is being evaluated for post-op follow-up 2 weeks s/p ligation and excision of increasingly painful and enlarged thrombosed Left antecubital venous aneurysm Audrey Chan, 05/06/2018). Patient reports complete resolution of her pre-operative LUE pain and and denies any LUE swelling or weakness, though she describes a small area of forearm numbness that she says has slowly been improving since her surgery. She otherwise denies any fever/chills, CP, or SOB and describes her incision(s) as well-approximated without any surrounding redness or any associated drainage, purulent or otherwise.  Review of Systems:  Constitutional: denies fever/chills  Respiratory: denies shortness of breath, wheezing  Cardiovascular: denies chest pain, palpitations Musculoskeletal: LUE pain, swelling, and numbness as per interval history Skin: Denies any other rashes or skin discolorations except post-surgical wounds as per interval history  Surgical Pathology (05/06/2018): VENOUS ANEURYSM WITH ORGANIZING THROMBUS  Assessment:  68 y.o. yo Female with a problem list including...  Patient Active Problem List   Diagnosis Date Noted  . Aneurysm of unspecified site ([place])   . Veno-occlusive disease   . Pulmonary nodules 01/28/2015  .  Supraclavicular adenopathy 01/21/2015  . Abnormal MRI, lumbar spine 01/01/2015  . Weight loss, unintentional 01/01/2015  . Lymphocytosis 01/01/2015  . Microcytic red blood cells 01/01/2015  . Recurrent biliary colic 53/66/4403  . Calculus of gallbladder with acute on chronic cholecystitis without obstruction 12/22/2014  . Encounter for fitting or adjustment of insulin pump 08/09/2014  . Diabetic peripheral neuropathy associated with type 2 diabetes mellitus ([place]) 08/09/2014  . Hypertension 05/24/2012  . Diabetes mellitus ([place]) 05/24/2012  . Idiopathic localized osteoarthropathy 04/09/2012  . Dermatitis, eczematoid 02/27/2012  . Can't get food down 09/26/2011    doing overall well at time of current post-surgical encounter/evaluation 2 weeks s/p ligation and excision of increasingly painful and enlarged thrombosed Left antecubital venous aneurysm Audrey Chan, 05/06/2018).  Follow-up Instructions / Plan:    - patient's pathology results were discussed             - okay to submerge incisions under water (baths, swimming) prn             - no heavy lifting >40 lbs x 2 more weeks, after which may gradually resume all activities without restrictions             - apply sunblock particularly to incisions with sun exposure to reduce pigmentation of scars             - patient advised she may be contacted to schedule a subsequent appointment/exam when such routine follow-up appointments may safely be performed, though patient may certainly decline a subsequent appointment if wishes to do so  - patient requests a copy of her progress note today and operative report, was advised she should have access to today's progress note via MyChart, for which patient says she has registered  and used previously (uncertain whether or not operative report is available via [facility])             - instructed to call office if any questions or concerns  All of the above recommendations were discussed with the patient, and  all of patient's questions were answered to her expressed satisfaction.  The patient was advised to call back or seek an in-person evaluation if the symptoms worsen or if the condition fails to improve as anticipated.  From [facility] outpatient surgery office, I provided 15 minutes of non-face-to-face time during this encounter.  -- Marilynne Drivers Audrey Hoes, MD, [place]: [facility] Partnering for exceptional care. Office: [phone]

## 2018-08-05 ENCOUNTER — Other Ambulatory Visit: Payer: Self-pay

## 2018-08-05 ENCOUNTER — Other Ambulatory Visit
Admission: RE | Admit: 2018-08-05 | Discharge: 2018-08-05 | Disposition: A | Discharge status: Home / Self Care | Payer: Medicare PPO | Source: Ambulatory Visit | Attending: Anesthesiology | Admitting: Anesthesiology

## 2018-08-05 DIAGNOSIS — Z1159 Encounter for screening for other viral diseases: Secondary | ICD-10-CM | POA: Diagnosis present

## 2018-08-06 LAB — NOVEL CORONAVIRUS, NAA (HOSP ORDER, SEND-OUT TO REF LAB; TAT 18-24 HRS): SARS-CoV-2, NAA: NOT DETECTED

## 2018-08-08 ENCOUNTER — Ambulatory Visit
Admission: RE | Admit: 2018-08-08 | Discharge: 2018-08-08 | Disposition: A | Discharge status: Home / Self Care | Payer: Medicare PPO | Source: Ambulatory Visit | Attending: Anesthesiology | Admitting: Anesthesiology

## 2018-08-08 ENCOUNTER — Encounter: Payer: Self-pay | Admitting: Anesthesiology

## 2018-08-08 ENCOUNTER — Ambulatory Visit (HOSPITAL_BASED_OUTPATIENT_CLINIC_OR_DEPARTMENT_OTHER): Payer: Medicare PPO | Admitting: Anesthesiology

## 2018-08-08 ENCOUNTER — Other Ambulatory Visit: Payer: Self-pay

## 2018-08-08 ENCOUNTER — Other Ambulatory Visit: Payer: Self-pay | Admitting: Anesthesiology

## 2018-08-08 DIAGNOSIS — R52 Pain, unspecified: Secondary | ICD-10-CM | POA: Insufficient documentation

## 2018-08-08 DIAGNOSIS — M5386 Other specified dorsopathies, lumbar region: Secondary | ICD-10-CM | POA: Insufficient documentation

## 2018-08-08 DIAGNOSIS — M5136 Other intervertebral disc degeneration, lumbar region: Secondary | ICD-10-CM | POA: Diagnosis present

## 2018-08-08 DIAGNOSIS — M51369 Other intervertebral disc degeneration, lumbar region without mention of lumbar back pain or lower extremity pain: Secondary | ICD-10-CM

## 2018-08-08 DIAGNOSIS — G894 Chronic pain syndrome: Secondary | ICD-10-CM | POA: Diagnosis present

## 2018-08-08 DIAGNOSIS — M5441 Lumbago with sciatica, right side: Secondary | ICD-10-CM | POA: Diagnosis present

## 2018-08-08 DIAGNOSIS — M4716 Other spondylosis with myelopathy, lumbar region: Secondary | ICD-10-CM | POA: Diagnosis present

## 2018-08-08 DIAGNOSIS — M5442 Lumbago with sciatica, left side: Secondary | ICD-10-CM | POA: Insufficient documentation

## 2018-08-08 DIAGNOSIS — M47817 Spondylosis without myelopathy or radiculopathy, lumbosacral region: Secondary | ICD-10-CM | POA: Insufficient documentation

## 2018-08-08 MED ORDER — SODIUM CHLORIDE 0.9% FLUSH
10.0000 mL | Freq: Once | INTRAVENOUS | Status: AC
  Administered 2018-08-08: 10 mL

## 2018-08-08 MED ORDER — IOPAMIDOL (ISOVUE-M 200) INJECTION 41%
20.0000 mL | Freq: Once | INTRAMUSCULAR | Status: DC | PRN
  Administered 2018-08-08: 20 mL
  Filled 2018-08-08: qty 20

## 2018-08-08 MED ORDER — ROPIVACAINE HCL 2 MG/ML IJ SOLN
INTRAMUSCULAR | Status: AC
  Filled 2018-08-08: qty 10

## 2018-08-08 MED ORDER — ROPIVACAINE HCL 2 MG/ML IJ SOLN
10.0000 mL | Freq: Once | INTRAMUSCULAR | Status: AC
  Administered 2018-08-08: 13:00:00 10 mL via EPIDURAL

## 2018-08-08 MED ORDER — TRIAMCINOLONE ACETONIDE 40 MG/ML IJ SUSP
INTRAMUSCULAR | Status: AC
  Filled 2018-08-08: qty 1

## 2018-08-08 MED ORDER — SODIUM CHLORIDE (PF) 0.9 % IJ SOLN
INTRAMUSCULAR | Status: AC
  Filled 2018-08-08: qty 10

## 2018-08-08 MED ORDER — LIDOCAINE HCL (PF) 1 % IJ SOLN
INTRAMUSCULAR | Status: AC
  Filled 2018-08-08: qty 10

## 2018-08-08 MED ORDER — LIDOCAINE HCL (PF) 1 % IJ SOLN
5.0000 mL | Freq: Once | INTRAMUSCULAR | Status: AC
  Administered 2018-08-08: 13:00:00 5 mL via SUBCUTANEOUS

## 2018-08-08 MED ORDER — TRIAMCINOLONE ACETONIDE 40 MG/ML IJ SUSP
40.0000 mg | Freq: Once | INTRAMUSCULAR | Status: AC
  Administered 2018-08-08: 40 mg

## 2018-08-08 NOTE — Progress Notes (Signed)
Safety precautions to be maintained throughout the outpatient stay will include: orient to surroundings, keep bed in low position, maintain call bell within reach at all times, provide assistance with transfer out of bed and ambulation.  

## 2018-08-08 NOTE — Patient Instructions (Signed)

## 2018-08-08 NOTE — Progress Notes (Signed)
Subjective:  Patient ID: Audrey Chan, female    DOB: 01-31-1951  Age: 68 y.o. MRN: [id number]  CC: Back Pain (lower)   Procedure: L5-S1 epidural steroid under fluoroscopic guidance without sedation  HPI Audrey Chan presents for reevaluation.  She was last seen several months ago and secondary to the COVID crisis was unable to have any procedures done.  She presents today with recurrent low back pain with radiation into the left greater than right posterior lateral legs and into the calves.  She is having some numbness affecting the base of both feet and occasionally feels weak in the posterior legs.  No troubles with bowel or bladder function is noted.  She still takes her tramadol sparingly in addition to gabapentin to help with pain relief.  These work well for her.  No side effects are reported.  Otherwise the pain is mainly a aching throbbing pain with spasming in the calves.  It is worse with activities.  Outpatient Medications Prior to Visit  Medication Sig Dispense Refill  . acetaminophen (TYLENOL) 500 MG tablet Take 1,000 mg by mouth every 6 (six) hours as needed for moderate pain.     Marland Kitchen albuterol (PROVENTIL HFA;VENTOLIN HFA) 108 (90 BASE) MCG/ACT inhaler Inhale 2 puffs into the lungs every 6 (six) hours as needed for wheezing or shortness of breath.     Marland Kitchen aspirin EC 81 MG tablet Take 81 mg by mouth daily.    Marland Kitchen atorvastatin (LIPITOR) 40 MG tablet Take 40 mg by mouth daily at 6 PM.     . cetirizine (ZYRTEC) 10 MG tablet Take 10 mg by mouth daily.     . Cholecalciferol (VITAMIN D3) 1000 units CAPS Take 1,000 Units by mouth daily.     . diclofenac sodium (VOLTAREN) 1 % GEL Apply 2 g topically 4 (four) times daily. (Patient taking differently: Apply 2 g topically 4 (four) times daily as needed (joint pain). ) 100 g 0  . ferrous sulfate 325 (65 FE) MG tablet Take 325 mg by mouth 2 (two) times daily with a meal.     . fluticasone (FLONASE) 50 MCG/ACT nasal spray Place 2 sprays  into both nostrils 2 (two) times daily.     Marland Kitchen gabapentin (NEURONTIN) 800 MG tablet Take 800 mg by mouth 3 (three) times daily.     . hydrochlorothiazide (HYDRODIURIL) 25 MG tablet Take 25 mg by mouth daily.    . IBU 800 MG tablet Take 800 mg by mouth every 8 (eight) hours as needed for moderate pain.     Marland Kitchen insulin glulisine (APIDRA) 100 UNIT/ML injection Inject 1-50 Units into the skin continuous. Continuously via pump     . ipratropium (ATROVENT HFA) 17 MCG/ACT inhaler Inhale 2 puffs into the lungs every 6 (six) hours as needed for wheezing.     Marland Kitchen lisinopril (PRINIVIL,ZESTRIL) 10 MG tablet Take 10 mg by mouth daily.    . metFORMIN (GLUCOPHAGE-XR) 500 MG 24 hr tablet Take 500 mg by mouth 2 (two) times daily.    . methocarbamol (ROBAXIN) 500 MG tablet Take 500 mg by mouth 2 (two) times daily as needed for muscle spasms.     . mometasone (ELOCON) 0.1 % cream Apply 1 application topically 2 (two) times daily as needed (rash).     . Multiple Vitamin (MULTI-VITAMINS) TABS Take 1 tablet by mouth daily.    . mupirocin ointment (BACTROBAN) 2 % Apply to wound after soaking BID (Patient taking differently: Apply 1 application topically  2 (two) times daily as needed (wound). ) 30 g 1  . polyethylene glycol powder (GLYCOLAX/MIRALAX) powder Take 0.5 Containers by mouth daily as needed for moderate constipation.     . ranitidine (ZANTAC) 150 MG tablet Take 150 mg by mouth daily.     . rosuvastatin (CRESTOR) 20 MG tablet Take 20 mg by mouth daily.      No facility-administered medications prior to visit.     Review of Systems CNS: No confusion or sedation Cardiac: No angina or palpitations GI: No abdominal pain or constipation Constitutional: No nausea vomiting fevers or chills  Objective:  BP (!) 147/78   Pulse 96   Temp 98.3 F (36.8 C)   Resp 14   Ht 5\' 7"  (1.702 m)   Wt 141 lb (64 kg)   SpO2 100%   BMI 22.08 kg/m    BP Readings from Last 3 Encounters:  08/08/18 (!) 147/78  05/06/18 (!)  151/66  05/02/18 127/73     Wt Readings from Last 3 Encounters:  08/08/18 141 lb (64 kg)  05/02/18 135 lb 4 oz (61.3 kg)  04/25/18 134 lb 9.6 oz (61.1 kg)     Physical Exam Pt is alert and oriented PERRL EOMI HEART IS RRR no murmur or rub LCTA no wheezing or rales MUSCULOSKELETAL reveals some paraspinous muscle tenderness but no overt trigger points.  She is ambulating with a mildly antalgic gait but her baseline strength appears intact.  Labs  No results found for: HGBA1C Lab Results  Component Value Date   CREATININE 1.06 (H) 05/02/2018    -------------------------------------------------------------------------------------------------------------------- Lab Results  Component Value Date   WBC 12.0 (H) 05/02/2018   HGB 11.4 (L) 05/02/2018   HCT 36.1 05/02/2018   PLT 379 05/02/2018   GLUCOSE 234 (H) 05/02/2018   ALT 44 11/12/2016   AST 44 (H) 11/12/2016   NA 136 05/02/2018   K 3.8 05/02/2018   CL 104 05/02/2018   CREATININE 1.06 (H) 05/02/2018   BUN 12 05/02/2018   CO2 23 05/02/2018    --------------------------------------------------------------------------------------------------------------------- No results found.   Assessment & Plan:   Audrey Chan was seen today for back pain.  Diagnoses and all orders for this visit:  DDD (degenerative disc disease), lumbar -     Lumbar Epidural Injection -     Lumbar Epidural Injection; Future  Sciatica of right side associated with disorder of lumbar spine -     Lumbar Epidural Injection  Lumbosacral spondylosis without myelopathy -     Lumbar Epidural Injection -     Lumbar Epidural Injection; Future  Acute bilateral low back pain with bilateral sciatica -     Lumbar Epidural Injection; Future  Lumbar spondylosis with myelopathy -     Lumbar Epidural Injection; Future  Chronic pain syndrome  Other orders -     triamcinolone acetonide (KENALOG-40) injection 40 mg -     sodium chloride flush (NS) 0.9 %  injection 10 mL -     ropivacaine (PF) 2 mg/mL (0.2%) (NAROPIN) injection 10 mL -     lidocaine (PF) (XYLOCAINE) 1 % injection 5 mL -     iopamidol (ISOVUE-M) 41 % intrathecal injection 20 mL        ----------------------------------------------------------------------------------------------------------------------  Problem List Items Addressed This Visit      Unprioritized   Acute bilateral low back pain with bilateral sciatica   Relevant Orders   Lumbar Epidural Injection   Chronic pain syndrome   DDD (degenerative disc disease), lumbar  Relevant Orders   Lumbar Epidural Injection   Lumbar spondylosis with myelopathy   Relevant Orders   Lumbar Epidural Injection    Other Visit Diagnoses    Sciatica of right side associated with disorder of lumbar spine       Lumbosacral spondylosis without myelopathy       Relevant Medications   triamcinolone acetonide (KENALOG-40) injection 40 mg (Completed)   Other Relevant Orders   Lumbar Epidural Injection        ----------------------------------------------------------------------------------------------------------------------  1. DDD (degenerative disc disease), lumbar We will proceed with an L5-S1 epidural steroid injection today.  Her last injection was back in February.  She generally gets 75% relief lasting approximately 2 to 3 months.  She continues to do her stretching exercises but states they are insufficient to keep her pain under control and the epidurals help when she has these periodically.  We have gone over the risks and benefits of the procedure in full detail and all questions are answered.  We will schedule her for return to clinic in 2 months.  An epidural will be scheduled for that time. - Lumbar Epidural Injection - Lumbar Epidural Injection; Future  2. Sciatica of right side associated with disorder of lumbar spine As above - Lumbar Epidural Injection  3. Lumbosacral spondylosis without myelopathy As  above and continue with core stretching strengthening exercises - Lumbar Epidural Injection - Lumbar Epidural Injection; Future  4. Acute bilateral low back pain with bilateral sciatica As above - Lumbar Epidural Injection; Future  5. Lumbar spondylosis with myelopathy  - Lumbar Epidural Injection; Future  6. Chronic pain syndrome Continue  with tramadol and gabapentin    ----------------------------------------------------------------------------------------------------------------------  I am having Audrey Chan maintain her albuterol, fluticasone, lisinopril, hydrochlorothiazide, insulin glulisine, Multi-Vitamins, ferrous sulfate, metFORMIN, aspirin EC, atorvastatin, ranitidine, diclofenac sodium, cetirizine, Vitamin D3, polyethylene glycol powder, acetaminophen, rosuvastatin, ipratropium, gabapentin, IBU, mupirocin ointment, methocarbamol, and mometasone. We administered triamcinolone acetonide, sodium chloride flush, ropivacaine (PF) 2 mg/mL (0.2%), lidocaine (PF), and iopamidol.   Meds ordered this encounter  Medications  . triamcinolone acetonide (KENALOG-40) injection 40 mg  . sodium chloride flush (NS) 0.9 % injection 10 mL  . ropivacaine (PF) 2 mg/mL (0.2%) (NAROPIN) injection 10 mL  . lidocaine (PF) (XYLOCAINE) 1 % injection 5 mL  . iopamidol (ISOVUE-M) 41 % intrathecal injection 20 mL   Patient's Medications  New Prescriptions   No medications on file  Previous Medications   ACETAMINOPHEN (TYLENOL) 500 MG TABLET    Take 1,000 mg by mouth every 6 (six) hours as needed for moderate pain.    ALBUTEROL (PROVENTIL HFA;VENTOLIN HFA) 108 (90 BASE) MCG/ACT INHALER    Inhale 2 puffs into the lungs every 6 (six) hours as needed for wheezing or shortness of breath.    ASPIRIN EC 81 MG TABLET    Take 81 mg by mouth daily.   ATORVASTATIN (LIPITOR) 40 MG TABLET    Take 40 mg by mouth daily at 6 PM.    CETIRIZINE (ZYRTEC) 10 MG TABLET    Take 10 mg by mouth daily.     CHOLECALCIFEROL (VITAMIN D3) 1000 UNITS CAPS    Take 1,000 Units by mouth daily.    DICLOFENAC SODIUM (VOLTAREN) 1 % GEL    Apply 2 g topically 4 (four) times daily.   FERROUS SULFATE 325 (65 FE) MG TABLET    Take 325 mg by mouth 2 (two) times daily with a meal.    FLUTICASONE (FLONASE) 50 MCG/ACT NASAL  SPRAY    Place 2 sprays into both nostrils 2 (two) times daily.    GABAPENTIN (NEURONTIN) 800 MG TABLET    Take 800 mg by mouth 3 (three) times daily.    HYDROCHLOROTHIAZIDE (HYDRODIURIL) 25 MG TABLET    Take 25 mg by mouth daily.   IBU 800 MG TABLET    Take 800 mg by mouth every 8 (eight) hours as needed for moderate pain.    INSULIN GLULISINE (APIDRA) 100 UNIT/ML INJECTION    Inject 1-50 Units into the skin continuous. Continuously via pump    IPRATROPIUM (ATROVENT HFA) 17 MCG/ACT INHALER    Inhale 2 puffs into the lungs every 6 (six) hours as needed for wheezing.    LISINOPRIL (PRINIVIL,ZESTRIL) 10 MG TABLET    Take 10 mg by mouth daily.   METFORMIN (GLUCOPHAGE-XR) 500 MG 24 HR TABLET    Take 500 mg by mouth 2 (two) times daily.   METHOCARBAMOL (ROBAXIN) 500 MG TABLET    Take 500 mg by mouth 2 (two) times daily as needed for muscle spasms.    MOMETASONE (ELOCON) 0.1 % CREAM    Apply 1 application topically 2 (two) times daily as needed (rash).    MULTIPLE VITAMIN (MULTI-VITAMINS) TABS    Take 1 tablet by mouth daily.   MUPIROCIN OINTMENT (BACTROBAN) 2 %    Apply to wound after soaking BID   POLYETHYLENE GLYCOL POWDER (GLYCOLAX/MIRALAX) POWDER    Take 0.5 Containers by mouth daily as needed for moderate constipation.    RANITIDINE (ZANTAC) 150 MG TABLET    Take 150 mg by mouth daily.    ROSUVASTATIN (CRESTOR) 20 MG TABLET    Take 20 mg by mouth daily.   Modified Medications   No medications on file  Discontinued Medications   No medications on file   ----------------------------------------------------------------------------------------------------------------------  Follow-up: Return in  about 2 months (around 10/08/2018) for evaluation, med refill, procedure.  Procedure: L5-S1 epidural steroid under fluoroscopic guidance without sedation   Procedure: L 5 S1 LESI with fluoroscopic guidance and no moderate sedation  NOTE: The risks, benefits, and expectations of the procedure have been discussed and explained to the patient who was understanding and in agreement with suggested treatment plan. No guarantees were made.  DESCRIPTION OF PROCEDURE: Lumbar epidural steroid injection with no IV Versed, EKG, blood pressure, pulse, and pulse oximetry monitoring. The procedure was performed with the patient in the prone position under fluoroscopic guidance.  Sterile prep x3 was initiated and I then injected subcutaneous lidocaine to the overlying L5-S1 site after its fluoroscopic identifictation.  Using strict aseptic technique, I then advanced an 18-gauge Tuohy epidural needle in the midline using interlaminar approach via loss-of-resistance to saline technique. There was negative aspiration for heme or  CSF.  I then confirmed position with both AP and Lateral fluoroscan.  2 cc of Omnipaque were injected and a  total of 5 mL of Preservative-Free normal saline mixed with 40 mg of Kenalog and 1cc Ropicaine 0.2 percent were injected incrementally via the  epidurally placed needle. The needle was removed. The patient tolerated the injection well and was convalesced and discharged to home in stable condition. Should the patient have any post procedure difficulty they have been instructed on how to contact us for assistance.   Molli Barrows, MD

## 2018-08-09 ENCOUNTER — Telehealth: Payer: Self-pay

## 2018-08-09 NOTE — Telephone Encounter (Signed)
Post procedure phone call.  LM 

## 2018-09-12 ENCOUNTER — Other Ambulatory Visit: Payer: Self-pay | Admitting: Anesthesiology

## 2018-09-15 ENCOUNTER — Other Ambulatory Visit: Payer: Self-pay

## 2018-09-15 ENCOUNTER — Ambulatory Visit
Admission: EM | Admit: 2018-09-15 | Discharge: 2018-09-15 | Disposition: A | Discharge status: Home / Self Care | Payer: Medicare PPO | Attending: Family Medicine | Admitting: Family Medicine

## 2018-09-15 ENCOUNTER — Encounter: Payer: Self-pay | Admitting: Emergency Medicine

## 2018-09-15 DIAGNOSIS — M5441 Lumbago with sciatica, right side: Secondary | ICD-10-CM | POA: Diagnosis not present

## 2018-09-15 MED ORDER — TRAMADOL HCL 50 MG PO TABS: 50.0000 mg | ORAL_TABLET | Freq: Two times a day (BID) | ORAL | 0 refills | Status: DC | PRN

## 2018-09-15 NOTE — ED Provider Notes (Signed)
[facility] ____________________________________________  Time seen: Approximately 2:02 PM  I have reviewed the triage vital signs and the nursing notes.   HISTORY  Chief Complaint Leg Pain (right) and Hip Pain (right)   HPI Audrey Chan is a 68 y.o. female past medical history of chronic low back pain with sciatica presenting for evaluation of low back pain.  States pain is been present for the last few days.  Patient reports she is having low back pain intermittently on the right in the left side, mostly on the right and intermittently radiating down the right leg.  States radiation is a tingly burning type pain that comes and goes.  Pain is worse with activity, particularly walking throughout her house.  Denies any fall, direct injury or direct trauma.  Continues to eat and drink well.  Denies urinary or bowel retention or incontinence.  States she follows with Dr. Andree Elk pain management for the same.  Patient was seen at the beginning of June and had a spinal epidural which helped some.  Patient has always taken tramadol as needed for breakthrough pain.  Patient states at her last visit she was asked if she needed more tramadol, but states that she felt she had enough.  States that she did run out of tramadol yesterday and requesting pain medicine until she can see her pain management.  Occasionally takes ibuprofen which helps some.  Denies other alleviating measures.  Denies other aggravating factors.  No recent cough, congestion, chest pain or shortness of breath or fevers.  Denies extremity edema.  Revelo, Elyse Jarvis, MD: PCP Pain: Andree Elk    Past Medical History:  Diagnosis Date  . Allergic rhinitis   . Aneurysm of unspecified site ([place])   . Cholelithiasis   . Diabetes type 2, controlled ([place])   . GERD (gastroesophageal reflux disease)   . Hypertension   . Neuropathy   . Veno-occlusive disease     Patient Active Problem List   Diagnosis Date Noted  . DDD  (degenerative disc disease), lumbar 08/08/2018  . Acute bilateral low back pain with bilateral sciatica 08/08/2018  . Lumbar spondylosis with myelopathy 08/08/2018  . Chronic pain syndrome 08/08/2018  . Pulmonary nodules 01/28/2015  . Supraclavicular adenopathy 01/21/2015  . Abnormal MRI, lumbar spine 01/01/2015  . Weight loss, unintentional 01/01/2015  . Lymphocytosis 01/01/2015  . Microcytic red blood cells 01/01/2015  . Recurrent biliary colic 82/42/3536  . Calculus of gallbladder with acute on chronic cholecystitis without obstruction 12/22/2014  . Encounter for fitting or adjustment of insulin pump 08/09/2014  . Diabetic peripheral neuropathy associated with type 2 diabetes mellitus ([place]) 08/09/2014  . Hypertension 05/24/2012  . Diabetes mellitus ([place]) 05/24/2012  . Idiopathic localized osteoarthropathy 04/09/2012  . Dermatitis, eczematoid 02/27/2012  . Can't get food down 09/26/2011    Past Surgical History:  Procedure Laterality Date  . ABDOMINAL HYSTERECTOMY  1972  . CERVICAL FUSION    . CHOLECYSTECTOMY N/A 12/28/2014   Procedure: LAPAROSCOPIC CHOLECYSTECTOMY;  Surgeon: Sherri Rad, MD;  Location: [facility] ORS;  Service: General;  Laterality: N/A;  . EYE SURGERY     detached retina  . MASS EXCISION Left 05/06/2018   Procedure: EXCISION OF LEFT ANTECUBITAL CYST, DIABETIC;  Surgeon: Vickie Epley, MD;  Location: [facility] ORS;  Service: Vascular;  Laterality: Left;     No current facility-administered medications for this encounter.   Current Outpatient Medications:  .  albuterol (PROVENTIL HFA;VENTOLIN HFA) 108 (90 BASE) MCG/ACT inhaler, Inhale 2 puffs into  the lungs every 6 (six) hours as needed for wheezing or shortness of breath. , Disp: , Rfl:  .  aspirin EC 81 MG tablet, Take 81 mg by mouth daily., Disp: , Rfl:  .  atorvastatin (LIPITOR) 40 MG tablet, Take 40 mg by mouth daily at 6 PM. , Disp: , Rfl:  .  cetirizine (ZYRTEC) 10 MG tablet, Take 10 mg by mouth daily. , Disp: ,  Rfl:  .  Cholecalciferol (VITAMIN D3) 1000 units CAPS, Take 1,000 Units by mouth daily. , Disp: , Rfl:  .  diclofenac sodium (VOLTAREN) 1 % GEL, Apply 2 g topically 4 (four) times daily. (Patient taking differently: Apply 2 g topically 4 (four) times daily as needed (joint pain). ), Disp: 100 g, Rfl: 0 .  ferrous sulfate 325 (65 FE) MG tablet, Take 325 mg by mouth 2 (two) times daily with a meal. , Disp: , Rfl:  .  fluticasone (FLONASE) 50 MCG/ACT nasal spray, Place 2 sprays into both nostrils 2 (two) times daily. , Disp: , Rfl:  .  gabapentin (NEURONTIN) 800 MG tablet, Take 800 mg by mouth 3 (three) times daily. , Disp: , Rfl:  .  hydrochlorothiazide (HYDRODIURIL) 25 MG tablet, Take 25 mg by mouth daily., Disp: , Rfl:  .  IBU 800 MG tablet, Take 800 mg by mouth every 8 (eight) hours as needed for moderate pain. , Disp: , Rfl:  .  insulin glulisine (APIDRA) 100 UNIT/ML injection, Inject 1-50 Units into the skin continuous. Continuously via pump , Disp: , Rfl:  .  ipratropium (ATROVENT HFA) 17 MCG/ACT inhaler, Inhale 2 puffs into the lungs every 6 (six) hours as needed for wheezing. , Disp: , Rfl:  .  lisinopril (PRINIVIL,ZESTRIL) 10 MG tablet, Take 10 mg by mouth daily., Disp: , Rfl:  .  metFORMIN (GLUCOPHAGE-XR) 500 MG 24 hr tablet, Take 500 mg by mouth 2 (two) times daily., Disp: , Rfl:  .  Multiple Vitamin (MULTI-VITAMINS) TABS, Take 1 tablet by mouth daily., Disp: , Rfl:  .  ranitidine (ZANTAC) 150 MG tablet, Take 150 mg by mouth daily. , Disp: , Rfl:  .  rosuvastatin (CRESTOR) 20 MG tablet, Take 20 mg by mouth daily. , Disp: , Rfl:  .  acetaminophen (TYLENOL) 500 MG tablet, Take 1,000 mg by mouth every 6 (six) hours as needed for moderate pain. , Disp: , Rfl:  .  methocarbamol (ROBAXIN) 500 MG tablet, Take 500 mg by mouth 2 (two) times daily as needed for muscle spasms. , Disp: , Rfl:  .  mometasone (ELOCON) 0.1 % cream, Apply 1 application topically 2 (two) times daily as needed (rash). ,  Disp: , Rfl:  .  mupirocin ointment (BACTROBAN) 2 %, Apply to wound after soaking BID (Patient taking differently: Apply 1 application topically 2 (two) times daily as needed (wound). ), Disp: 30 g, Rfl: 1 .  polyethylene glycol powder (GLYCOLAX/MIRALAX) powder, Take 0.5 Containers by mouth daily as needed for moderate constipation. , Disp: , Rfl:  .  traMADol (ULTRAM) 50 MG tablet, Take 1 tablet (50 mg total) by mouth every 12 (twelve) hours as needed for moderate pain., Disp: 8 tablet, Rfl: 0  Allergies Patient has no known allergies.  Family History  Problem Relation Age of Onset  . Lung cancer Father     Social History Social History   Tobacco Use  . Smoking status: Former Smoker    Quit date: 02/21/1999    Years since quitting: 19.5  .  Smokeless tobacco: Never Used  Substance Use Topics  . Alcohol use: No    Alcohol/week: 0.0 standard drinks  . Drug use: No    Review of Systems Constitutional: No fever ENT: No sore throat. Cardiovascular: Denies chest pain. Respiratory: Denies shortness of breath. Gastrointestinal: No abdominal pain.  No nausea, no vomiting.  No diarrhea.  No constipation. Genitourinary: Negative for dysuria. Musculoskeletal: Positive for back pain. Skin: Negative for rash. Neurological: Negative for headaches.   ____________________________________________   PHYSICAL EXAM:  VITAL SIGNS: ED Triage Vitals  Enc Vitals Group     BP 09/15/18 1345 (!) 141/87     Pulse Rate 09/15/18 1345 74     Resp 09/15/18 1345 16     Temp 09/15/18 1345 98.3 F (36.8 C)     Temp Source 09/15/18 1345 Oral     SpO2 09/15/18 1345 100 %     Weight 09/15/18 1342 140 lb (63.5 kg)     Height 09/15/18 1342 5\' 7"  (1.702 m)     Head Circumference --      Peak Flow --      Pain Score 09/15/18 1341 2     Pain Loc --      Pain Edu? --      Excl. in [place]? --     Constitutional: Alert and oriented. Well appearing and in no acute distress. Eyes: Conjunctivae are normal.   ENT      Head: Normocephalic and atraumatic. Cardiovascular: Normal rate, regular rhythm. Grossly normal heart sounds.  Good peripheral circulation. Respiratory: Normal respiratory effort without tachypnea nor retractions. Breath sounds are clear and equal bilaterally. No wheezes, rales, rhonchi. Gastrointestinal: Soft and nontender. Musculoskeletal: No midline cervical or thoracic tenderness to palpation. Bilateral pedal pulses equal and easily palpated.    Except: Minimal midline lower lumbar tenderness to palpation as well as bilateral para lumbar tenderness, right lower paralumbar and over right greater sciatic notch tenderness to palpation, no hip tenderness, able to fully lumbar flex and extend as well as right and left rotate, no pain with standing bilateral knee lifts, no saddle anesthesia, sensation intact bilaterally to the lower extremities.  Bilateral plantar flexion and dorsiflexion strong and equal.  Ambulatory with steady gait. Neurologic:  Normal speech and language. No gross focal neurologic deficits are appreciated. Speech is normal. No gait instability.  Skin:  Skin is warm, dry and intact. No rash noted. Psychiatric: Mood and affect are normal. Speech and behavior are normal. Patient exhibits appropriate insight and judgment   ___________________________________________   LABS (all labs ordered are listed, but only abnormal results are displayed)  Labs Reviewed - No data to display ____________________________________________   PROCEDURES Procedures    INITIAL IMPRESSION / ASSESSMENT AND PLAN / ED COURSE  Pertinent labs & imaging results that were available during my care of the patient were reviewed by me and considered in my medical decision making (see chart for details).  Well-appearing patient.  No focal neurological deficit.  No injury or trauma.  Presenting for evaluation of acute on chronic low back pain.  Patient follows with pain management.  States that  she is currently out of her tramadol and unable to get an appointment until next week.  [place] controlled substance database reviewed.  Will prescribe quantity 8 tramadol as needed for breakthrough pain until patient can see her regular provider.  Discussed her follow-up and return parameters.Discussed indication, risks and benefits of medications with patient.  Patient also requested work note, note  given.  Discussed follow up with Primary care physician this week. Discussed follow up and return parameters including no resolution or any worsening concerns. Patient verbalized understanding and agreed to plan.   [place] controlled substance database reviewed, see below:   07/29/2018  1   05/16/2018  Tramadol Hcl 50 MG Tablet  90.00 30 Ja [place]   [id number]   [facility] ([id number])   1  15.00 MME  Medicare   [place]  05/16/2018  1   05/16/2018  Tramadol Hcl 50 MG Tablet  90.00 30 Ja [place]   [id number]   [facility] ([id number])   0  15.00 MME  Medicare   [place]  04/10/2018  1   03/14/2018  Tramadol Hcl 50 MG Tablet  90.00 30 Ja [place]   [id number]   [facility] ([id number])   1  15.00 MME  Medicare     03/14/2018  1   03/14/2018  Tramadol Hcl 50 MG Tablet  90.00 30 Ja [place]   [id number]   [facility] ([id number])   0  15.00 MME  Medicare      ____________________________________________   FINAL CLINICAL IMPRESSION(S) / ED DIAGNOSES  Final diagnoses:  Acute right-sided low back pain with right-sided sciatica     ED Discharge Orders         Ordered    traMADol (ULTRAM) 50 MG tablet  Every 12 hours PRN     09/15/18 1400           Note: This dictation was prepared with Dragon dictation along with smaller phrase technology. Any transcriptional errors that result from this process are unintentional.         Marylene Land, NP 09/15/18 1417

## 2018-09-15 NOTE — ED Triage Notes (Signed)
Patient c/o right hip and right leg pain that started a week ago.  Patient states that she is out of Tramadol.

## 2018-09-15 NOTE — Discharge Instructions (Addendum)
Take medication as prescribed. Rest. Drink plenty of fluids.   Follow-up with your pain management tomorrow as discussed.  Follow up with your primary care physician this week as needed. Return to Urgent care for new or worsening concerns.

## 2018-09-16 ENCOUNTER — Telehealth: Payer: Self-pay | Admitting: *Deleted

## 2018-09-16 NOTE — Telephone Encounter (Signed)
Patient has an appointment on 8-3 with Dr Andree Elk.  If she wants medications changed, she needs to make an appointment.

## 2018-09-23 ENCOUNTER — Ambulatory Visit: Payer: Medicare PPO | Attending: Anesthesiology | Admitting: Anesthesiology

## 2018-09-23 ENCOUNTER — Other Ambulatory Visit: Payer: Self-pay

## 2018-09-23 DIAGNOSIS — M5442 Lumbago with sciatica, left side: Secondary | ICD-10-CM | POA: Diagnosis not present

## 2018-09-23 DIAGNOSIS — M5136 Other intervertebral disc degeneration, lumbar region: Secondary | ICD-10-CM

## 2018-09-23 DIAGNOSIS — M5441 Lumbago with sciatica, right side: Secondary | ICD-10-CM

## 2018-09-23 DIAGNOSIS — M5386 Other specified dorsopathies, lumbar region: Secondary | ICD-10-CM

## 2018-09-23 DIAGNOSIS — M47817 Spondylosis without myelopathy or radiculopathy, lumbosacral region: Secondary | ICD-10-CM

## 2018-09-23 DIAGNOSIS — G894 Chronic pain syndrome: Secondary | ICD-10-CM

## 2018-09-23 DIAGNOSIS — M4716 Other spondylosis with myelopathy, lumbar region: Secondary | ICD-10-CM

## 2018-09-23 DIAGNOSIS — M47816 Spondylosis without myelopathy or radiculopathy, lumbar region: Secondary | ICD-10-CM

## 2018-09-23 MED ORDER — TRAMADOL HCL 50 MG PO TABS: 50.0000 mg | ORAL_TABLET | Freq: Three times a day (TID) | ORAL | 2 refills | Status: AC

## 2018-09-23 NOTE — Progress Notes (Signed)
Virtual Visit via Video Note  I connected with [name] on 09/23/18 at  2:45 PM EDT by a video enabled telemedicine application and verified that I am speaking with the correct person using two identifiers.  Location: Patient: Home Provider: Control center   I discussed the limitations of evaluation and management by telemedicine and the availability of in person appointments. The patient expressed understanding and agreed to proceed.  History of Present Illness: I spoke with Audrey Chan today regarding her low back pain.  She had been doing well following her last epidural in June however recently fell and now has some spasming pain in the right posterior leg and calf.  This is similar to what she has experienced in the past.  She is try to take some muscle relaxant and do some conservative stretching but is barely able to function.  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  She is taking tramadol 1 tablet 3 times a day but is out of this.  Otherwise she is in her usual state of health.  No changes in bowel or bladder function or lower extremity strength or function are noted at this time.    Observations/Objective: Current Outpatient Medications:  .  acetaminophen (TYLENOL) 500 MG tablet, Take 1,000 mg by mouth every 6 (six) hours as needed for moderate pain. , Disp: , Rfl:  .  albuterol (PROVENTIL HFA;VENTOLIN HFA) 108 (90 BASE) MCG/ACT inhaler, Inhale 2 puffs into the lungs every 6 (six) hours as needed for wheezing or shortness of breath. , Disp: , Rfl:  .  aspirin EC 81 MG tablet, Take 81 mg by mouth daily., Disp: , Rfl:  .  atorvastatin (LIPITOR) 40 MG tablet, Take 40 mg by mouth daily at 6 PM. , Disp: , Rfl:  .  cetirizine (ZYRTEC) 10 MG tablet, Take 10 mg by mouth daily. , Disp: , Rfl:  .  Cholecalciferol (VITAMIN D3) 1000 units CAPS, Take 1,000 Units by mouth daily. , Disp: , Rfl:  .  diclofenac sodium (VOLTAREN) 1 % GEL, Apply 2 g topically 4 (four)  times daily. (Patient taking differently: Apply 2 g topically 4 (four) times daily as needed (joint pain). ), Disp: 100 g, Rfl: 0 .  ferrous sulfate 325 (65 FE) MG tablet, Take 325 mg by mouth 2 (two) times daily with a meal. , Disp: , Rfl:  .  fluticasone (FLONASE) 50 MCG/ACT nasal spray, Place 2 sprays into both nostrils 2 (two) times daily. , Disp: , Rfl:  .  gabapentin (NEURONTIN) 800 MG tablet, Take 800 mg by mouth 3 (three) times daily. , Disp: , Rfl:  .  hydrochlorothiazide (HYDRODIURIL) 25 MG tablet, Take 25 mg by mouth daily., Disp: , Rfl:  .  IBU 800 MG tablet, Take 800 mg by mouth every 8 (eight) hours as needed for moderate pain. , Disp: , Rfl:  .  insulin glulisine (APIDRA) 100 UNIT/ML injection, Inject 1-50 Units into the skin continuous. Continuously via pump , Disp: , Rfl:  .  ipratropium (ATROVENT HFA) 17 MCG/ACT inhaler, Inhale 2 puffs into the lungs every 6 (six) hours as needed for wheezing. , Disp: , Rfl:  .  lisinopril (PRINIVIL,ZESTRIL) 10 MG tablet, Take 10 mg by mouth daily., Disp: , Rfl:  .  metFORMIN (GLUCOPHAGE-XR) 500 MG 24 hr tablet, Take 500 mg by mouth 2 (two) times daily., Disp: , Rfl:  .  methocarbamol (ROBAXIN) 500 MG tablet, Take 500 mg by mouth  2 (two) times daily as needed for muscle spasms. , Disp: , Rfl:  .  mometasone (ELOCON) 0.1 % cream, Apply 1 application topically 2 (two) times daily as needed (rash). , Disp: , Rfl:  .  Multiple Vitamin (MULTI-VITAMINS) TABS, Take 1 tablet by mouth daily., Disp: , Rfl:  .  mupirocin ointment (BACTROBAN) 2 %, Apply to wound after soaking BID (Patient taking differently: Apply 1 application topically 2 (two) times daily as needed (wound). ), Disp: 30 g, Rfl: 1 .  polyethylene glycol powder (GLYCOLAX/MIRALAX) powder, Take 0.5 Containers by mouth daily as needed for moderate constipation. , Disp: , Rfl:  .  ranitidine (ZANTAC) 150 MG tablet, Take 150 mg by mouth daily. , Disp: , Rfl:  .  rosuvastatin (CRESTOR) 20 MG tablet,  Take 20 mg by mouth daily. , Disp: , Rfl:  .  traMADol (ULTRAM) 50 MG tablet, Take 1 tablet (50 mg total) by mouth every 8 (eight) hours., Disp: 90 tablet, Rfl: 2   Assessment and Plan: 1. DDD (degenerative disc disease), lumbar   2. Sciatica of right side associated with disorder of lumbar spine   3. Lumbosacral spondylosis without myelopathy   4. Acute bilateral low back pain with bilateral sciatica   5. Chronic pain syndrome   6. Lumbar spondylosis with myelopathy   7. Facet arthritis of lumbar region   Based on our discussion today I think would be reasonable if she has not improved within the next week or 2 to repeat a epidural earlier than her next visit scheduled.  I am also going to have her increase her tramadol to 2 tablets 3 times a day as needed in addition to adding in an anti-inflammatory Naprosyn to 220 mg tablets twice a day and restart her Robaxin bilateral grams at bedtime.  Her Naprosyn was refilled today after review of the [facility] practitioner database information.   Follow Up Instructions:    I discussed the assessment and treatment plan with the patient. The patient was provided an opportunity to ask questions and all were answered. The patient agreed with the plan and demonstrated an understanding of the instructions.   The patient was advised to call back or seek an in-person evaluation if the symptoms worsen or if the condition fails to improve as anticipated.  I provided 30 minutes of non-face-to-face time during this encounter.   Molli Barrows, MD

## 2018-10-07 ENCOUNTER — Emergency Department
Admission: EM | Admit: 2018-10-07 | Discharge: 2018-10-07 | Disposition: A | Discharge status: Home / Self Care | Payer: Medicare PPO | Attending: Emergency Medicine | Admitting: Emergency Medicine

## 2018-10-07 ENCOUNTER — Encounter: Payer: Self-pay | Admitting: Emergency Medicine

## 2018-10-07 ENCOUNTER — Other Ambulatory Visit: Payer: Self-pay

## 2018-10-07 DIAGNOSIS — Z79899 Other long term (current) drug therapy: Secondary | ICD-10-CM | POA: Diagnosis not present

## 2018-10-07 DIAGNOSIS — Z794 Long term (current) use of insulin: Secondary | ICD-10-CM | POA: Insufficient documentation

## 2018-10-07 DIAGNOSIS — Z87891 Personal history of nicotine dependence: Secondary | ICD-10-CM | POA: Insufficient documentation

## 2018-10-07 DIAGNOSIS — E1165 Type 2 diabetes mellitus with hyperglycemia: Secondary | ICD-10-CM | POA: Insufficient documentation

## 2018-10-07 DIAGNOSIS — I10 Essential (primary) hypertension: Secondary | ICD-10-CM | POA: Diagnosis not present

## 2018-10-07 DIAGNOSIS — Z7982 Long term (current) use of aspirin: Secondary | ICD-10-CM | POA: Insufficient documentation

## 2018-10-07 DIAGNOSIS — R739 Hyperglycemia, unspecified: Secondary | ICD-10-CM

## 2018-10-07 LAB — URINALYSIS, COMPLETE (UACMP) WITH MICROSCOPIC
Bacteria, UA: NONE SEEN
Bilirubin Urine: NEGATIVE
Glucose, UA: >=500 mg/dL — AB
Hgb urine dipstick: NEGATIVE
Ketones, ur: NEGATIVE mg/dL
Leukocytes,Ua: NEGATIVE
Nitrite: NEGATIVE
Protein, ur: NEGATIVE mg/dL
Specific Gravity, Urine: 1.009 (ref 1.005–1.030)
pH: 5 (ref 5.0–8.0)

## 2018-10-07 LAB — CBC
HCT: 34.8 % — ABNORMAL LOW (ref 36.0–46.0)
Hemoglobin: 11.4 g/dL — ABNORMAL LOW (ref 12.0–15.0)
MCH: 25.1 pg — ABNORMAL LOW (ref 26.0–34.0)
MCHC: 32.8 g/dL (ref 30.0–36.0)
MCV: 76.7 fL — ABNORMAL LOW (ref 80.0–100.0)
Platelets: 543 10*3/uL — ABNORMAL HIGH (ref 150–400)
RBC: 4.54 MIL/uL (ref 3.87–5.11)
RDW: 12.9 % (ref 11.5–15.5)
WBC: 13.9 10*3/uL — ABNORMAL HIGH (ref 4.0–10.5)
nRBC: 0 % (ref 0.0–0.2)

## 2018-10-07 LAB — BASIC METABOLIC PANEL
Anion gap: 11 (ref 5–15)
BUN: 19 mg/dL (ref 8–23)
CO2: 22 mmol/L (ref 22–32)
Calcium: 10.2 mg/dL (ref 8.9–10.3)
Chloride: 97 mmol/L — ABNORMAL LOW (ref 98–111)
Creatinine, Ser: 1.19 mg/dL — ABNORMAL HIGH (ref 0.44–1.00)
GFR calc Af Amer: 54 mL/min — ABNORMAL LOW (ref >60)
GFR calc non Af Amer: 47 mL/min — ABNORMAL LOW (ref >60)
Glucose, Bld: 385 mg/dL — ABNORMAL HIGH (ref 70–99)
Potassium: 4.2 mmol/L (ref 3.5–5.1)
Sodium: 130 mmol/L — ABNORMAL LOW (ref 135–145)

## 2018-10-07 LAB — GLUCOSE, CAPILLARY: Glucose-Capillary: 338 mg/dL — ABNORMAL HIGH (ref 70–99)

## 2018-10-07 NOTE — ED Triage Notes (Signed)
Here for hyperglycemia. Pt reports at her PCP and they could not read it that it was so high.  Here was 338.  Pt denies polydipsia or polyuria.  VSS. Alert and oriented.

## 2018-10-07 NOTE — ED Provider Notes (Signed)
[facility] Emergency Department Provider Note   ____________________________________________    I have reviewed the triage vital signs and the nursing notes.   HISTORY  Chief Complaint Hyperglycemia     HPI Audrey Chan is a 68 y.o. female with a history of diabetes who presents for elevated blood sugar.  Patient reports he was at her PCP and they found her blood glucose to be "high "so they sent her to the emergency department.  She reports she feels well and has no complaints.  No nausea vomiting or abdominal pain.  Has not noticed any polyuria.  No fevers or chills or dysuria.  She does have a insulin pump  Past Medical History:  Diagnosis Date  . Allergic rhinitis   . Aneurysm of unspecified site ([place])   . Cholelithiasis   . Diabetes type 2, controlled ([place])   . GERD (gastroesophageal reflux disease)   . Hypertension   . Neuropathy   . Veno-occlusive disease     Patient Active Problem List   Diagnosis Date Noted  . DDD (degenerative disc disease), lumbar 08/08/2018  . Acute bilateral low back pain with bilateral sciatica 08/08/2018  . Lumbar spondylosis with myelopathy 08/08/2018  . Chronic pain syndrome 08/08/2018  . Pulmonary nodules 01/28/2015  . Supraclavicular adenopathy 01/21/2015  . Abnormal MRI, lumbar spine 01/01/2015  . Weight loss, unintentional 01/01/2015  . Lymphocytosis 01/01/2015  . Microcytic red blood cells 01/01/2015  . Recurrent biliary colic 87/86/7672  . Calculus of gallbladder with acute on chronic cholecystitis without obstruction 12/22/2014  . Encounter for fitting or adjustment of insulin pump 08/09/2014  . Diabetic peripheral neuropathy associated with type 2 diabetes mellitus ([place]) 08/09/2014  . Hypertension 05/24/2012  . Diabetes mellitus ([place]) 05/24/2012  . Idiopathic localized osteoarthropathy 04/09/2012  . Dermatitis, eczematoid 02/27/2012  . Can't get food down 09/26/2011    Past Surgical  History:  Procedure Laterality Date  . ABDOMINAL HYSTERECTOMY  1972  . CERVICAL FUSION    . CHOLECYSTECTOMY N/A 12/28/2014   Procedure: LAPAROSCOPIC CHOLECYSTECTOMY;  Surgeon: Sherri Rad, MD;  Location: [facility] ORS;  Service: General;  Laterality: N/A;  . EYE SURGERY     detached retina  . MASS EXCISION Left 05/06/2018   Procedure: EXCISION OF LEFT ANTECUBITAL CYST, DIABETIC;  Surgeon: Vickie Epley, MD;  Location: [facility] ORS;  Service: Vascular;  Laterality: Left;    Prior to Admission medications   Medication Sig Start Date End Date Taking? Authorizing Provider  acetaminophen (TYLENOL) 500 MG tablet Take 1,000 mg by mouth every 6 (six) hours as needed for moderate pain.  05/17/12   [provider]  albuterol (PROVENTIL HFA;VENTOLIN HFA) 108 (90 BASE) MCG/ACT inhaler Inhale 2 puffs into the lungs every 6 (six) hours as needed for wheezing or shortness of breath.     [provider]  aspirin EC 81 MG tablet Take 81 mg by mouth daily.    [provider]  atorvastatin (LIPITOR) 40 MG tablet Take 40 mg by mouth daily at 6 PM.  11/16/14   [provider]  cetirizine (ZYRTEC) 10 MG tablet Take 10 mg by mouth daily.     [provider]  Cholecalciferol (VITAMIN D3) 1000 units CAPS Take 1,000 Units by mouth daily.     [provider]  diclofenac sodium (VOLTAREN) 1 % GEL Apply 2 g topically 4 (four) times daily. Patient taking differently: Apply 2 g topically 4 (four) times daily as needed (joint pain).  04/15/16   Laban Emperor, PA-C  ferrous sulfate 325 (65 FE) MG tablet Take 325 mg by mouth 2 (two) times daily with a meal.     [provider]  fluticasone (FLONASE) 50 MCG/ACT nasal spray Place 2 sprays into both nostrils 2 (two) times daily.     [provider]  gabapentin (NEURONTIN) 800 MG tablet Take 800 mg by mouth 3 (three) times daily.  12/26/16   [provider]  hydrochlorothiazide (HYDRODIURIL) 25 MG tablet Take  25 mg by mouth daily.    [provider]  IBU 800 MG tablet Take 800 mg by mouth every 8 (eight) hours as needed for moderate pain.  01/08/17   [provider]  insulin glulisine (APIDRA) 100 UNIT/ML injection Inject 1-50 Units into the skin continuous. Continuously via pump     [provider]  ipratropium (ATROVENT HFA) 17 MCG/ACT inhaler Inhale 2 puffs into the lungs every 6 (six) hours as needed for wheezing.     [provider]  lisinopril (PRINIVIL,ZESTRIL) 10 MG tablet Take 10 mg by mouth daily.    [provider]  metFORMIN (GLUCOPHAGE-XR) 500 MG 24 hr tablet Take 500 mg by mouth 2 (two) times daily.    [provider]  methocarbamol (ROBAXIN) 500 MG tablet Take 500 mg by mouth 2 (two) times daily as needed for muscle spasms.     [provider]  mometasone (ELOCON) 0.1 % cream Apply 1 application topically 2 (two) times daily as needed (rash).  10/08/17   [provider]  Multiple Vitamin (MULTI-VITAMINS) TABS Take 1 tablet by mouth daily.    [provider]  mupirocin ointment (BACTROBAN) 2 % Apply to wound after soaking BID Patient taking differently: Apply 1 application topically 2 (two) times daily as needed (wound).  09/26/17   Hyatt, Max T, DPM  polyethylene glycol powder (GLYCOLAX/MIRALAX) powder Take 0.5 Containers by mouth daily as needed for moderate constipation.  10/08/15   [provider]  ranitidine (ZANTAC) 150 MG tablet Take 150 mg by mouth daily.  05/03/15   [provider]  rosuvastatin (CRESTOR) 20 MG tablet Take 20 mg by mouth daily.  05/02/13   [provider]  traMADol (ULTRAM) 50 MG tablet Take 1 tablet (50 mg total) by mouth every 8 (eight) hours. 09/23/18 10/23/18  Molli Barrows, MD     Allergies Patient has no known allergies.  Family History  Problem Relation Age of Onset  . Lung cancer Father     Social History Social History   Tobacco Use  . Smoking  status: Former Smoker    Quit date: 02/21/1999    Years since quitting: 19.6  . Smokeless tobacco: Never Used  Substance Use Topics  . Alcohol use: No    Alcohol/week: 0.0 standard drinks  . Drug use: No    Review of Systems  Constitutional: No fever/chills Eyes: No visual changes.  ENT: No polydipsia Cardiovascular: Denies chest pain. Respiratory: Denies shortness of breath. Gastrointestinal: As above Genitourinary: Negative for dysuria. Musculoskeletal: Negative for back pain. Skin: Negative for rash. Neurological: Negative for headaches or weakness   ____________________________________________   PHYSICAL EXAM:  VITAL SIGNS: ED Triage Vitals  Enc Vitals Group     BP 10/07/18 1741 (!) 106/92     Pulse Rate 10/07/18 1741 95     Resp 10/07/18 1741 18     Temp 10/07/18 1741 99.2 F (37.3 C)     Temp Source 10/07/18 1741  Oral     SpO2 10/07/18 1741 97 %     Weight 10/07/18 1741 60.3 kg (133 lb)     Height 10/07/18 1741 1.702 m (5\' 7" )     Head Circumference --      Peak Flow --      Pain Score 10/07/18 1744 0     Pain Loc --      Pain Edu? --      Excl. in [place]? --     Constitutional: Alert and oriented.  Eyes: Conjunctivae are normal.   Nose: No congestion/rhinnorhea. Mouth/Throat: Mucous membranes are moist.    Cardiovascular: Normal rate, regular rhythm. Grossly normal heart sounds.  Good peripheral circulation. Respiratory: Normal respiratory effort.  No retractions. Gastrointestinal: Soft and nontender. No distention.    Musculoskeletal: No lower extremity tenderness nor edema.  Warm and well perfused Neurologic:  Normal speech and language. No gross focal neurologic deficits are appreciated.  Skin:  Skin is warm, dry and intact. No rash noted. Psychiatric: Mood and affect are normal. Speech and behavior are normal.  ____________________________________________   LABS (all labs ordered are listed, but only abnormal results are displayed)  Labs  Reviewed  BASIC METABOLIC PANEL - Abnormal; Notable for the following components:      Result Value   Sodium 130 (*)    Chloride 97 (*)    Glucose, Bld 385 (*)    Creatinine, Ser 1.19 (*)    GFR calc non Af Amer 47 (*)    GFR calc Af Amer 54 (*)    All other components within normal limits  CBC - Abnormal; Notable for the following components:   WBC 13.9 (*)    Hemoglobin 11.4 (*)    HCT 34.8 (*)    MCV 76.7 (*)    MCH 25.1 (*)    Platelets 543 (*)    All other components within normal limits  URINALYSIS, COMPLETE (UACMP) WITH MICROSCOPIC - Abnormal; Notable for the following components:   Color, Urine YELLOW (*)    APPearance HAZY (*)    Glucose, UA >=500 (*)    All other components within normal limits  GLUCOSE, CAPILLARY - Abnormal; Notable for the following components:   Glucose-Capillary 338 (*)    All other components within normal limits  CBG MONITORING, ED   ____________________________________________  EKG  None ____________________________________________  RADIOLOGY  None ____________________________________________   PROCEDURES  Procedure(s) performed: No  Procedures   Critical Care performed: No ____________________________________________   INITIAL IMPRESSION / ASSESSMENT AND PLAN / ED COURSE  Pertinent labs & imaging results that were available during my care of the patient were reviewed by me and considered in my medical decision making (see chart for details).  Patient well-appearing in no acute distress.  Her glucose is improving here in the emergency department.  Her anion gap is normal.  No evidence of DKA.  Exam is quite reassuring.  She was able to give herself a bolus of 5 units of insulin via her insulin pump here after she heard that she was not in DKA.  She states "I am hungry and I need to go ".  Given reassuring lab work feel this is appropriate, counseled her to closely monitor her sugar tonight     ____________________________________________   FINAL CLINICAL IMPRESSION(S) / ED DIAGNOSES  Final diagnoses:  Hyperglycemia        Note:  This document was prepared using Dragon voice recognition software and may include unintentional dictation errors.  Lavonia Drafts, MD 10/07/18 2013

## 2018-10-10 ENCOUNTER — Ambulatory Visit: Payer: Medicare PPO | Attending: Anesthesiology | Admitting: Anesthesiology

## 2018-10-10 NOTE — Progress Notes (Deleted)
Virtual Visit via Telephone Note  I connected with Audrey Chan on 10/10/18 at 12:45 PM EDT by telephone and verified that I am speaking with the correct person using two identifiers.  Location: Patient: Home Provider: Pain control center   I discussed the limitations, risks, security and privacy concerns of performing an evaluation and management service by telephone and the availability of in person appointments. I also discussed with the patient that there may be a patient responsible charge related to this service. The patient expressed understanding and agreed to proceed.   History of Present Illness: I reached out to [name] today via telephone.  I was unable to touch base but left a message for her to contact only passage should she continue to have any problems with her low back pain.  We are currently scheduling her for an epidural and will likely do this within the next few weeks.   Observations/Objective:   Assessment and Plan:   Follow Up Instructions:    I discussed the assessment and treatment plan with the patient. The patient was provided an opportunity to ask questions and all were answered. The patient agreed with the plan and demonstrated an understanding of the instructions.   The patient was advised to call back or seek an in-person evaluation if the symptoms worsen or if the condition fails to improve as anticipated.  I provided 10 minutes of non-face-to-face time during this encounter.   Molli Barrows, MD

## 2018-11-12 ENCOUNTER — Telehealth: Payer: Self-pay | Admitting: *Deleted

## 2018-11-12 NOTE — Telephone Encounter (Signed)
Patient wants to talk about possible injection and increasing her medications. Informed her to call back and make a VV with Dr. Andree Elk.

## 2018-12-29 ENCOUNTER — Other Ambulatory Visit: Payer: Self-pay

## 2018-12-29 ENCOUNTER — Emergency Department: Payer: Medicare PPO

## 2018-12-29 ENCOUNTER — Inpatient Hospital Stay
Admission: EM | Admit: 2018-12-29 | Discharge: 2019-01-03 | DRG: 871 | Disposition: A | Discharge status: Home Health | Payer: Medicare PPO | Attending: Internal Medicine | Admitting: Internal Medicine

## 2018-12-29 DIAGNOSIS — E860 Dehydration: Secondary | ICD-10-CM | POA: Diagnosis present

## 2018-12-29 DIAGNOSIS — D689 Coagulation defect, unspecified: Secondary | ICD-10-CM | POA: Diagnosis present

## 2018-12-29 DIAGNOSIS — G92 Toxic encephalopathy: Secondary | ICD-10-CM | POA: Diagnosis present

## 2018-12-29 DIAGNOSIS — Z20828 Contact with and (suspected) exposure to other viral communicable diseases: Secondary | ICD-10-CM | POA: Diagnosis present

## 2018-12-29 DIAGNOSIS — R509 Fever, unspecified: Secondary | ICD-10-CM | POA: Diagnosis not present

## 2018-12-29 DIAGNOSIS — Z981 Arthrodesis status: Secondary | ICD-10-CM

## 2018-12-29 DIAGNOSIS — E785 Hyperlipidemia, unspecified: Secondary | ICD-10-CM | POA: Diagnosis present

## 2018-12-29 DIAGNOSIS — Z87891 Personal history of nicotine dependence: Secondary | ICD-10-CM

## 2018-12-29 DIAGNOSIS — R6520 Severe sepsis without septic shock: Secondary | ICD-10-CM | POA: Diagnosis present

## 2018-12-29 DIAGNOSIS — Z7982 Long term (current) use of aspirin: Secondary | ICD-10-CM

## 2018-12-29 DIAGNOSIS — Z801 Family history of malignant neoplasm of trachea, bronchus and lung: Secondary | ICD-10-CM

## 2018-12-29 DIAGNOSIS — E876 Hypokalemia: Secondary | ICD-10-CM | POA: Diagnosis present

## 2018-12-29 DIAGNOSIS — A419 Sepsis, unspecified organism: Principal | ICD-10-CM | POA: Diagnosis present

## 2018-12-29 DIAGNOSIS — J440 Chronic obstructive pulmonary disease with acute lower respiratory infection: Secondary | ICD-10-CM | POA: Diagnosis present

## 2018-12-29 DIAGNOSIS — E871 Hypo-osmolality and hyponatremia: Secondary | ICD-10-CM | POA: Diagnosis present

## 2018-12-29 DIAGNOSIS — I10 Essential (primary) hypertension: Secondary | ICD-10-CM | POA: Diagnosis present

## 2018-12-29 DIAGNOSIS — E875 Hyperkalemia: Secondary | ICD-10-CM | POA: Diagnosis present

## 2018-12-29 DIAGNOSIS — G9341 Metabolic encephalopathy: Secondary | ICD-10-CM

## 2018-12-29 DIAGNOSIS — Z79899 Other long term (current) drug therapy: Secondary | ICD-10-CM

## 2018-12-29 DIAGNOSIS — N179 Acute kidney failure, unspecified: Secondary | ICD-10-CM | POA: Diagnosis present

## 2018-12-29 DIAGNOSIS — Z794 Long term (current) use of insulin: Secondary | ICD-10-CM

## 2018-12-29 DIAGNOSIS — E1110 Type 2 diabetes mellitus with ketoacidosis without coma: Secondary | ICD-10-CM | POA: Diagnosis present

## 2018-12-29 DIAGNOSIS — I639 Cerebral infarction, unspecified: Secondary | ICD-10-CM | POA: Diagnosis present

## 2018-12-29 DIAGNOSIS — E1111 Type 2 diabetes mellitus with ketoacidosis with coma: Secondary | ICD-10-CM

## 2018-12-29 DIAGNOSIS — J181 Lobar pneumonia, unspecified organism: Secondary | ICD-10-CM | POA: Diagnosis present

## 2018-12-29 DIAGNOSIS — Z8673 Personal history of transient ischemic attack (TIA), and cerebral infarction without residual deficits: Secondary | ICD-10-CM

## 2018-12-29 DIAGNOSIS — D649 Anemia, unspecified: Secondary | ICD-10-CM | POA: Diagnosis present

## 2018-12-29 DIAGNOSIS — R4182 Altered mental status, unspecified: Secondary | ICD-10-CM

## 2018-12-29 DIAGNOSIS — I6381 Other cerebral infarction due to occlusion or stenosis of small artery: Secondary | ICD-10-CM | POA: Diagnosis present

## 2018-12-29 DIAGNOSIS — E1140 Type 2 diabetes mellitus with diabetic neuropathy, unspecified: Secondary | ICD-10-CM | POA: Diagnosis present

## 2018-12-29 LAB — PROTIME-INR
INR: 1.4 — ABNORMAL HIGH (ref 0.8–1.2)
Prothrombin Time: 17 seconds — ABNORMAL HIGH (ref 11.4–15.2)

## 2018-12-29 LAB — LIPASE, BLOOD: Lipase: 13 U/L (ref 11–51)

## 2018-12-29 LAB — CBC WITH DIFFERENTIAL/PLATELET
Abs Immature Granulocytes: 0.04 10*3/uL (ref 0.00–0.07)
Basophils Absolute: 0.1 10*3/uL (ref 0.0–0.1)
Basophils Relative: 1 %
Eosinophils Absolute: 0 10*3/uL (ref 0.0–0.5)
Eosinophils Relative: 0 %
HCT: 35.2 % — ABNORMAL LOW (ref 36.0–46.0)
Hemoglobin: 11.4 g/dL — ABNORMAL LOW (ref 12.0–15.0)
Immature Granulocytes: 0 %
Lymphocytes Relative: 8 %
Lymphs Abs: 1 10*3/uL (ref 0.7–4.0)
MCH: 23.8 pg — ABNORMAL LOW (ref 26.0–34.0)
MCHC: 32.4 g/dL (ref 30.0–36.0)
MCV: 73.5 fL — ABNORMAL LOW (ref 80.0–100.0)
Monocytes Absolute: 0.3 10*3/uL (ref 0.1–1.0)
Monocytes Relative: 3 %
Neutro Abs: 11.1 10*3/uL — ABNORMAL HIGH (ref 1.7–7.7)
Neutrophils Relative %: 88 %
Platelets: 362 10*3/uL (ref 150–400)
RBC: 4.79 MIL/uL (ref 3.87–5.11)
RDW: 13.4 % (ref 11.5–15.5)
WBC: 12.6 10*3/uL — ABNORMAL HIGH (ref 4.0–10.5)
nRBC: 0 % (ref 0.0–0.2)

## 2018-12-29 LAB — URINALYSIS, COMPLETE (UACMP) WITH MICROSCOPIC
Bacteria, UA: NONE SEEN
Bilirubin Urine: NEGATIVE
Glucose, UA: >=500 mg/dL — AB
Ketones, ur: 20 mg/dL — AB
Leukocytes,Ua: NEGATIVE
Nitrite: NEGATIVE
Protein, ur: 30 mg/dL — AB
Specific Gravity, Urine: 1.02 (ref 1.005–1.030)
pH: 5 (ref 5.0–8.0)

## 2018-12-29 LAB — COMPREHENSIVE METABOLIC PANEL
ALT: 44 U/L (ref 0–44)
AST: 25 U/L (ref 15–41)
Albumin: 4.2 g/dL (ref 3.5–5.0)
Alkaline Phosphatase: 329 U/L — ABNORMAL HIGH (ref 38–126)
Anion gap: 22 — ABNORMAL HIGH (ref 5–15)
BUN: 27 mg/dL — ABNORMAL HIGH (ref 8–23)
CO2: 16 mmol/L — ABNORMAL LOW (ref 22–32)
Calcium: 9.6 mg/dL (ref 8.9–10.3)
Chloride: 85 mmol/L — ABNORMAL LOW (ref 98–111)
Creatinine, Ser: 2 mg/dL — ABNORMAL HIGH (ref 0.44–1.00)
GFR calc Af Amer: 29 mL/min — ABNORMAL LOW (ref >60)
GFR calc non Af Amer: 25 mL/min — ABNORMAL LOW (ref >60)
Glucose, Bld: 965 mg/dL (ref 70–99)
Potassium: 5.6 mmol/L — ABNORMAL HIGH (ref 3.5–5.1)
Sodium: 123 mmol/L — ABNORMAL LOW (ref 135–145)
Total Bilirubin: 1.1 mg/dL (ref 0.3–1.2)
Total Protein: 8.6 g/dL — ABNORMAL HIGH (ref 6.5–8.1)

## 2018-12-29 LAB — GLUCOSE, CAPILLARY: Glucose-Capillary: >600 mg/dL (ref 70–99)

## 2018-12-29 LAB — APTT: aPTT: 32 seconds (ref 24–36)

## 2018-12-29 LAB — LACTIC ACID, PLASMA: Lactic Acid, Venous: 3.4 mmol/L (ref 0.5–1.9)

## 2018-12-29 LAB — PROCALCITONIN: Procalcitonin: 0.25 ng/mL

## 2018-12-29 MED ORDER — LORAZEPAM 2 MG/ML IJ SOLN
1.0000 mg | Freq: Once | INTRAMUSCULAR | Status: AC
  Administered 2018-12-29: 1 mg via INTRAVENOUS

## 2018-12-29 MED ORDER — SODIUM CHLORIDE 0.9 % IV SOLN
INTRAVENOUS | Status: DC
  Administered 2018-12-30: 01:00:00 via INTRAVENOUS

## 2018-12-29 MED ORDER — VANCOMYCIN HCL IN DEXTROSE 1-5 GM/200ML-% IV SOLN
1000.0000 mg | Freq: Once | INTRAVENOUS | Status: AC
  Administered 2018-12-29: 1000 mg via INTRAVENOUS
  Filled 2018-12-29: qty 200

## 2018-12-29 MED ORDER — SODIUM CHLORIDE 0.9 % IV BOLUS (SEPSIS)
1000.0000 mL | Freq: Once | INTRAVENOUS | Status: AC
  Administered 2018-12-29: 1000 mL via INTRAVENOUS

## 2018-12-29 MED ORDER — HALOPERIDOL LACTATE 5 MG/ML IJ SOLN
5.0000 mg | Freq: Once | INTRAMUSCULAR | Status: DC
  Filled 2018-12-29 (×2): qty 1

## 2018-12-29 MED ORDER — VANCOMYCIN HCL 500 MG IV SOLR
500.0000 mg | Freq: Once | INTRAVENOUS | Status: AC
  Administered 2018-12-30: 500 mg via INTRAVENOUS
  Filled 2018-12-29 (×2): qty 500

## 2018-12-29 MED ORDER — ACETAMINOPHEN 650 MG RE SUPP
650.0000 mg | Freq: Once | RECTAL | Status: AC
  Administered 2018-12-29: 650 mg via RECTAL
  Filled 2018-12-29: qty 1

## 2018-12-29 MED ORDER — METRONIDAZOLE IN NACL 5-0.79 MG/ML-% IV SOLN
500.0000 mg | Freq: Once | INTRAVENOUS | Status: AC
  Administered 2018-12-29: 500 mg via INTRAVENOUS
  Filled 2018-12-29: qty 100

## 2018-12-29 MED ORDER — SODIUM CHLORIDE 0.9 % IV BOLUS (SEPSIS)
1000.0000 mL | Freq: Once | INTRAVENOUS | Status: AC
  Administered 2018-12-29: 22:00:00 1000 mL via INTRAVENOUS

## 2018-12-29 MED ORDER — SODIUM CHLORIDE 0.9 % IV BOLUS
1000.0000 mL | Freq: Once | INTRAVENOUS | Status: AC
  Administered 2018-12-29: 1000 mL via INTRAVENOUS

## 2018-12-29 MED ORDER — SODIUM CHLORIDE 0.9 % IV SOLN
2.0000 g | Freq: Once | INTRAVENOUS | Status: AC
  Administered 2018-12-29: 21:00:00 2 g via INTRAVENOUS
  Filled 2018-12-29: qty 2

## 2018-12-29 MED ORDER — LIDOCAINE-EPINEPHRINE 2 %-1:100000 IJ SOLN
20.0000 mL | Freq: Once | INTRAMUSCULAR | Status: AC
  Administered 2018-12-29: 20 mL
  Filled 2018-12-29: qty 1

## 2018-12-29 MED ORDER — LORAZEPAM 2 MG/ML IJ SOLN
1.0000 mg | Freq: Once | INTRAMUSCULAR | Status: AC
  Administered 2018-12-29: 1 mg via INTRAVENOUS
  Filled 2018-12-29: qty 1

## 2018-12-29 MED ORDER — INSULIN REGULAR(HUMAN) IN NACL 100-0.9 UT/100ML-% IV SOLN
INTRAVENOUS | Status: DC
  Administered 2018-12-29: 5.4 [IU]/h via INTRAVENOUS
  Administered 2018-12-30: 4 [IU]/h via INTRAVENOUS
  Filled 2018-12-29 (×2): qty 100

## 2018-12-29 NOTE — Progress Notes (Signed)
PHARMACY -  BRIEF ANTIBIOTIC NOTE   Pharmacy has received consult(s) for Vancomycin, Cefepime  from an ED provider.  The patient's profile has been reviewed for ht/wt/allergies/indication/available labs.    One time order(s) placed for Vancomycin 1500 mg IV X 1 and Cefepime 2 gm IV X 1.   Further antibiotics/pharmacy consults should be ordered by admitting physician if indicated.                       Thank you, Wilmar Prabhakar D 12/29/2018  8:46 PM

## 2018-12-29 NOTE — ED Triage Notes (Signed)
Pt comes EMS from home with AMS since noon today. Pt recently had a stroke and is diabetic. Pt CBG 476. Pt combative with EMS. Given 5mg  haldol. Pt was in soft restraints with EMS. Pt tachycardiac at 140 and febrile at 1002 axillary. Pt not able t answer any questions at this time but is alert.

## 2018-12-29 NOTE — ED Notes (Signed)
Patient transported to CT 

## 2018-12-29 NOTE — ED Provider Notes (Addendum)
[facility] Emergency Department Provider Note  ____________________________________________  Time seen: Approximately 8:35 PM  I have reviewed the triage vital signs and the nursing notes.   HISTORY  Chief Complaint Altered Mental Status    Level 5 Caveat: Portions of the History and Physical including HPI and review of systems are unable to be completely obtained due to patient being a poor historian   HPI TIAWNA GILLETT is a 68 y.o. female with a history of diabetes, hypertension, GERD, prior stroke who is brought to the ED by EMS due to confusion first noticed at about noon today by her neighbors.  Patient is not able to provide any history.  EMS reports that the patient was agitated and confused and combative on scene and they gave 5 mg of IV Haldol with online medical control.  No known trauma history.  EMS report an axillary temperature of 100.3, heart rate of 140.      Past Medical History:  Diagnosis Date  . Allergic rhinitis   . Aneurysm of unspecified site ([place])   . Cholelithiasis   . Diabetes type 2, controlled ([place])   . GERD (gastroesophageal reflux disease)   . Hypertension   . Neuropathy   . Veno-occlusive disease      Patient Active Problem List   Diagnosis Date Noted  . DDD (degenerative disc disease), lumbar 08/08/2018  . Acute bilateral low back pain with bilateral sciatica 08/08/2018  . Lumbar spondylosis with myelopathy 08/08/2018  . Chronic pain syndrome 08/08/2018  . Pulmonary nodules 01/28/2015  . Supraclavicular adenopathy 01/21/2015  . Abnormal MRI, lumbar spine 01/01/2015  . Weight loss, unintentional 01/01/2015  . Lymphocytosis 01/01/2015  . Microcytic red blood cells 01/01/2015  . Recurrent biliary colic [id number]  . Calculus of gallbladder with acute on chronic cholecystitis without obstruction 12/22/2014  . Encounter for fitting or adjustment of insulin pump 08/09/2014  . Diabetic peripheral neuropathy  associated with type 2 diabetes mellitus ([place]) 08/09/2014  . Hypertension 05/24/2012  . Diabetes mellitus ([place]) 05/24/2012  . Idiopathic localized osteoarthropathy 04/09/2012  . Dermatitis, eczematoid 02/27/2012  . Can't get food down 09/26/2011     Past Surgical History:  Procedure Laterality Date  . ABDOMINAL HYSTERECTOMY  1972  . CERVICAL FUSION    . CHOLECYSTECTOMY N/A 12/28/2014   Procedure: LAPAROSCOPIC CHOLECYSTECTOMY;  Surgeon: Sherri Rad, MD;  Location: [facility] ORS;  Service: General;  Laterality: N/A;  . EYE SURGERY     detached retina  . MASS EXCISION Left 05/06/2018   Procedure: EXCISION OF LEFT ANTECUBITAL CYST, DIABETIC;  Surgeon: Vickie Epley, MD;  Location: [facility] ORS;  Service: Vascular;  Laterality: Left;     Prior to Admission medications   Medication Sig Start Date End Date Taking? Authorizing Provider  acetaminophen (TYLENOL) 500 MG tablet Take 1,000 mg by mouth every 6 (six) hours as needed for moderate pain.  05/17/12   [provider]  albuterol (PROVENTIL HFA;VENTOLIN HFA) 108 (90 BASE) MCG/ACT inhaler Inhale 2 puffs into the lungs every 6 (six) hours as needed for wheezing or shortness of breath.     [provider]  aspirin EC 81 MG tablet Take 81 mg by mouth daily.    [provider]  atorvastatin (LIPITOR) 40 MG tablet Take 40 mg by mouth daily at 6 PM.  11/16/14   [provider]  cetirizine (ZYRTEC) 10 MG tablet Take 10 mg by mouth daily.     [provider]  Cholecalciferol (VITAMIN D3)  1000 units CAPS Take 1,000 Units by mouth daily.     [provider]  diclofenac sodium (VOLTAREN) 1 % GEL Apply 2 g topically 4 (four) times daily. Patient taking differently: Apply 2 g topically 4 (four) times daily as needed (joint pain).  04/15/16   Laban Emperor, PA-C  ferrous sulfate 325 (65 FE) MG tablet Take 325 mg by mouth 2 (two) times daily with a meal.     [provider]  fluticasone (FLONASE) 50  MCG/ACT nasal spray Place 2 sprays into both nostrils 2 (two) times daily.     [provider]  gabapentin (NEURONTIN) 800 MG tablet Take 800 mg by mouth 3 (three) times daily.  12/26/16   [provider]  hydrochlorothiazide (HYDRODIURIL) 25 MG tablet Take 25 mg by mouth daily.    [provider]  IBU 800 MG tablet Take 800 mg by mouth every 8 (eight) hours as needed for moderate pain.  01/08/17   [provider]  insulin glulisine (APIDRA) 100 UNIT/ML injection Inject 1-50 Units into the skin continuous. Continuously via pump     [provider]  ipratropium (ATROVENT HFA) 17 MCG/ACT inhaler Inhale 2 puffs into the lungs every 6 (six) hours as needed for wheezing.     [provider]  lisinopril (PRINIVIL,ZESTRIL) 10 MG tablet Take 10 mg by mouth daily.    [provider]  metFORMIN (GLUCOPHAGE-XR) 500 MG 24 hr tablet Take 500 mg by mouth 2 (two) times daily.    [provider]  methocarbamol (ROBAXIN) 500 MG tablet Take 500 mg by mouth 2 (two) times daily as needed for muscle spasms.     [provider]  mometasone (ELOCON) 0.1 % cream Apply 1 application topically 2 (two) times daily as needed (rash).  10/08/17   [provider]  Multiple Vitamin (MULTI-VITAMINS) TABS Take 1 tablet by mouth daily.    [provider]  mupirocin ointment (BACTROBAN) 2 % Apply to wound after soaking BID Patient taking differently: Apply 1 application topically 2 (two) times daily as needed (wound).  09/26/17   Hyatt, Max T, DPM  polyethylene glycol powder (GLYCOLAX/MIRALAX) powder Take 0.5 Containers by mouth daily as needed for moderate constipation.  10/08/15   [provider]  ranitidine (ZANTAC) 150 MG tablet Take 150 mg by mouth daily.  05/03/15   [provider]  rosuvastatin (CRESTOR) 20 MG tablet Take 20 mg by mouth daily.  05/02/13   [provider]     Allergies Patient has no known  allergies.   Family History  Problem Relation Age of Onset  . Lung cancer Father     Social History Social History   Tobacco Use  . Smoking status: Former Smoker    Quit date: 02/21/1999    Years since quitting: 19.8  . Smokeless tobacco: Never Used  Substance Use Topics  . Alcohol use: No    Alcohol/week: 0.0 standard drinks  . Drug use: No    Review of Systems Level 5 Caveat: Portions of the History and Physical including HPI and review of systems are unable to be completely obtained due to patient being a poor historian   Constitutional:   Positive fever.  ENT:   No rhinorrhea. Cardiovascular:   No chest pain or syncope. Respiratory:   No dyspnea or cough. Gastrointestinal:   Negative for abdominal pain, vomiting and diarrhea.  Musculoskeletal:   Negative for focal pain or swelling ____________________________________________   PHYSICAL EXAM:  VITAL SIGNS: ED Triage Vitals [12/29/18 2029]  Enc Vitals Group     BP      Pulse      Resp      Temp      Temp src      SpO2      Weight 132 lb 4.4 oz (60 kg)     Height 5\' 8"  (1.727 m)     Head Circumference      Peak Flow      Pain Score      Pain Loc      Pain Edu?      Excl. in [place]?     Vital signs reviewed, nursing assessments reviewed.   Constitutional:   Awake, not alert, not oriented.  Ill-appearing, restless Eyes:   Conjunctivae are normal. EOMI. PERRL. ENT      Head:   Normocephalic and atraumatic.      Nose:   No congestion/rhinnorhea.       Mouth/Throat:   Dry mucous membranes, no pharyngeal erythema. No peritonsillar mass.       Neck:   No meningismus. Full ROM.  No midline spinal tenderness, no step-off Hematological/Lymphatic/Immunilogical:   No cervical lymphadenopathy. Cardiovascular:   Tachycardia heart rate 130. Symmetric bilateral radial and DP pulses.  No murmurs. Cap refill less than 2 seconds. Respiratory:   Normal respiratory effort without tachypnea/retractions. Breath sounds are clear  and equal bilaterally. No wheezes/rales/rhonchi. Gastrointestinal:   Soft with suprapubic tenderness. Non distended. There is no CVA tenderness.  No rebound, rigidity, or guarding. Musculoskeletal:   Normal range of motion in all extremities. No joint effusions.  No lower extremity tenderness.  No edema. Neurologic:   Not interactive, no verbal expressions.  Motor grossly intact. GCS = E3V1M5 = 9 Skin:    Skin is warm, dry and intact. No rash noted.  No petechiae, purpura, or bullae.  ____________________________________________    LABS (pertinent positives/negatives) (all labs ordered are listed, but only abnormal results are displayed) Labs Reviewed  LACTIC ACID, PLASMA - Abnormal; Notable for the following components:      Result Value   Lactic Acid, Venous 3.4 (*)    All other components within normal limits  COMPREHENSIVE METABOLIC PANEL - Abnormal; Notable for the following components:   Sodium 123 (*)    Potassium 5.6 (*)    Chloride 85 (*)    CO2 16 (*)    Glucose, Bld 965 (*)    BUN 27 (*)    Creatinine, Ser 2.00 (*)    Total Protein 8.6 (*)    Alkaline Phosphatase 329 (*)    GFR calc non Af Amer 25 (*)    GFR calc Af Amer 29 (*)    Anion gap 22 (*)    All other components within normal limits  CBC WITH DIFFERENTIAL/PLATELET - Abnormal; Notable for the following components:   WBC 12.6 (*)    Hemoglobin 11.4 (*)    HCT 35.2 (*)    MCV 73.5 (*)    MCH 23.8 (*)    Neutro Abs 11.1 (*)    All other components within normal limits  PROTIME-INR - Abnormal; Notable for the following components:   Prothrombin Time 17.0 (*)    INR 1.4 (*)    All other components within normal limits  URINALYSIS, COMPLETE (UACMP) WITH MICROSCOPIC - Abnormal; Notable for the following components:   Color, Urine YELLOW (*)    APPearance HAZY (*)    Glucose, UA >=500 (*)  Hgb urine dipstick SMALL (*)    Ketones, ur 20 (*)    Protein, ur 30 (*)    All other components within normal  limits  BLOOD GAS, VENOUS - Abnormal; Notable for the following components:   pCO2, Ven 38 (*)    Bicarbonate 18.7 (*)    Acid-base deficit 7.1 (*)    All other components within normal limits  GLUCOSE, CAPILLARY - Abnormal; Notable for the following components:   Glucose-Capillary >600 (*)    All other components within normal limits  CULTURE, BLOOD (ROUTINE X 2)  CULTURE, BLOOD (ROUTINE X 2)  URINE CULTURE  SARS CORONAVIRUS 2 (TAT 6-24 HRS)  LIPASE, BLOOD  PROCALCITONIN  APTT  LACTIC ACID, PLASMA   ____________________________________________   EKG  Interpreted by me Atrial fibrillation, rate of 129, normal axis and intervals.  Poor R wave progression.  Normal ST segments.  Evidence of LVH.  Significant baseline wander due to patient's confusion and tremor limits interpretation  ____________________________________________    RADIOLOGY  Ct Head Wo Contrast  Result Date: 12/29/2018 CLINICAL DATA:  Loss of consciousness EXAM: CT HEAD WITHOUT CONTRAST TECHNIQUE: Contiguous axial images were obtained from the base of the skull through the vertex without intravenous contrast. COMPARISON:  None. FINDINGS: Brain: No evidence of acute territorial infarction, hemorrhage, hydrocephalus,extra-axial collection or mass lesion/mass effect. The ventricles are normal in size and contour. Low-attenuation changes in the deep white matter consistent with small vessel ischemia. Vascular: No hyperdense vessel or unexpected calcification. Skull: The skull is intact. No fracture or focal lesion identified. Sinuses/Orbits: The visualized paranasal sinuses and mastoid air cells are clear. The orbits and globes intact. Other: None IMPRESSION: No acute intracranial abnormality. Findings consistent with chronic small vessel ischemia Electronically Signed   By: Prudencio Pair M.D.   On: 12/29/2018 23:08   Dg Chest Port 1 View  Result Date: 12/29/2018 CLINICAL DATA:  Altered mental status EXAM: PORTABLE CHEST  1 VIEW COMPARISON:  November 12, 2016 FINDINGS: The heart size and mediastinal contours are within normal limits. There is prominence to the central pulmonary vasculature. No large airspace consolidation or pleural effusion. IMPRESSION: Findings suggestive pulmonary vascular congestion. Electronically Signed   By: Prudencio Pair M.D.   On: 12/29/2018 21:16    ____________________________________________   PROCEDURES .Critical Care Performed by: Carrie Mew, MD Authorized by: Carrie Mew, MD   Critical care provider statement:    Critical care time (minutes):  35   Critical care time was exclusive of:  Separately billable procedures and treating other patients   Critical care was necessary to treat or prevent imminent or life-threatening deterioration of the following conditions:  Sepsis and CNS failure or compromise   Critical care was time spent personally by me on the following activities:  Development of treatment plan with patient or surrogate, discussions with consultants, evaluation of patient's response to treatment, examination of patient, obtaining history from patient or surrogate, ordering and performing treatments and interventions, ordering and review of laboratory studies, ordering and review of radiographic studies, pulse oximetry, re-evaluation of patient's condition and review of old charts Comments:          .Lumbar Puncture  Date/Time: 12/29/2018 11:41 PM Performed by: Carrie Mew, MD Authorized by: Carrie Mew, MD   Consent:    Consent obtained:  Emergent situation   Consent given by: Attending physician on behalf of patient, considering patient's best interest.   Risks discussed:  Headache, bleeding, infection, pain, nerve damage and repeat procedure  Alternatives discussed:  Alternative treatment Pre-procedure details:    Procedure purpose:  Diagnostic   Preparation: Patient was prepped and draped in usual sterile fashion   Anesthesia  (see MAR for exact dosages):    Anesthesia method:  Local infiltration   Local anesthetic:  Lidocaine 1% WITH epi Procedure details:    Lumbar space:  L3-L4 interspace   Patient position:  R lateral decubitus   Needle gauge:  20   Needle type:  Spinal needle - Quincke tip   Needle length (in):  3.5   Number of attempts:  3   Total volume (ml):  0 Post-procedure:    Puncture site:  Adhesive bandage applied and direct pressure applied   Patient tolerance of procedure:  Tolerated well, no immediate complications Comments:     Procedure unsuccessful    ____________________________________________  DIFFERENTIAL DIAGNOSIS   Sepsis, UTI, pneumonia, intracranial hemorrhage, stroke, electrolyte abnormality, intoxication  CLINICAL IMPRESSION / ASSESSMENT AND PLAN / ED COURSE  Medications ordered in the ED: Medications  vancomycin (VANCOCIN) 500 mg in sodium chloride 0.9 % 100 mL IVPB (has no administration in time range)  haloperidol lactate (HALDOL) injection 5 mg (has no administration in time range)  insulin regular, human (MYXREDLIN) 100 units/ 100 mL infusion (5.4 Units/hr Intravenous New Bag/Given 12/29/18 2314)  sodium chloride 0.9 % bolus 1,000 mL (1,000 mLs Intravenous New Bag/Given 12/29/18 2315)    And  0.9 %  sodium chloride infusion (has no administration in time range)  sodium chloride 0.9 % bolus 1,000 mL (1,000 mLs Intravenous New Bag/Given 12/29/18 2131)    And  sodium chloride 0.9 % bolus 1,000 mL (0 mLs Intravenous Stopped 12/29/18 2238)  ceFEPIme (MAXIPIME) 2 g in sodium chloride 0.9 % 100 mL IVPB (0 g Intravenous Stopped 12/29/18 2213)  metroNIDAZOLE (FLAGYL) IVPB 500 mg (0 mg Intravenous Stopped 12/29/18 2238)  vancomycin (VANCOCIN) IVPB 1000 mg/200 mL premix (0 mg Intravenous Stopped 12/29/18 2332)  LORazepam (ATIVAN) injection 1 mg (1 mg Intravenous Given 12/29/18 2035)  LORazepam (ATIVAN) injection 1 mg (1 mg Intravenous Given 12/29/18 2123)  acetaminophen (TYLENOL)  suppository 650 mg (650 mg Rectal Given 12/29/18 2304)  lidocaine-EPINEPHrine (XYLOCAINE W/EPI) 2 %-1:100000 (with pres) injection 20 mL (20 mLs Infiltration Given by Other 12/29/18 2318)    Pertinent labs & imaging results that were available during my care of the patient were reviewed by me and considered in my medical decision making (see chart for details).   LAURENA FENTER was evaluated in Emergency Department on 12/29/2018 for the symptoms described in the history of present illness. She was evaluated in the context of the global COVID-19 pandemic, which necessitated consideration that the patient might be at risk for infection with the SARS-CoV-2 virus that causes COVID-19. Institutional protocols and algorithms that pertain to the evaluation of patients at risk for COVID-19 are in a state of rapid change based on information released by regulatory bodies including the CDC and federal and state organizations. These policies and algorithms were followed during the patient's care in the ED.   Patient presents with altered mental status, suspect delirium from infection and sepsis.  Will start 2 L saline bolus for resuscitation, empiric antibiotics with vancomycin, cefepime, Flagyl.  IV Ativan to calm the patient to facilitate work-up including CT head.  Clinical Course as of Dec 29 2343  Nancy Fetter Dec 29, 2018  2128 Chest x-ray image viewed by me, clear spaces without pneumonia.  No pleural effusion or pneumothorax.  No fractures.  Radiology interpretation agrees   [PS]  2241 Labs show profound hyperglycemia of 965 with acute kidney injury, elevated anion gap, ketones in urine, pH of 7.3, all consistent with diabetic ketoacidosis.  This does not explain the high fever which does not appear to be due to pneumonia or UTI.  Most likely cause at this point is Covid, test is pending.  Will discuss with hospitalist.  Order insulin drip.   [PS]  2335 Sepsis reassessment has been completed.     [PS]     Clinical Course User Index [PS] Carrie Mew, MD    ----------------------------------------- 11:40 PM on 12/29/2018 -----------------------------------------  Lumbar puncture attempted but unsuccessful.  Patient remains encephalopathic, tachycardic.  Maintaining airway.  Continue IV fluids and antibiotics.  Discussed with hospitalist for further management.   ____________________________________________   FINAL CLINICAL IMPRESSION(S) / ED DIAGNOSES    Final diagnoses:  Altered mental status, unspecified altered mental status type  Sepsis with acute renal failure without septic shock, due to unspecified organism, unspecified acute renal failure type ([place])  Diabetic ketoacidosis with coma associated with type 2 diabetes mellitus ([place])  Sepsis   ED Discharge Orders    None      Portions of this note were generated with dragon dictation software. Dictation errors may occur despite best attempts at proofreading.   Carrie Mew, MD 12/29/18 2344    Carrie Mew, MD 12/29/18 [phone]

## 2018-12-29 NOTE — ED Notes (Signed)
Only able to get one set of blood cultures with multiple attempts by 4 nurses. Antibiotics to be started instead.

## 2018-12-29 NOTE — Progress Notes (Signed)
CODE SEPSIS - PHARMACY COMMUNICATION  **Broad Spectrum Antibiotics should be administered within 1 hour of Sepsis diagnosis**  Time Code Sepsis Called/Page Received: 11/8 @ 2031  Antibiotics Ordered:  Vancomycin , Cefepime   Time of 1st antibiotic administration:  Cefepime 2 gm IV X 1 11/8 @ 2129  Additional action taken by pharmacy:   If necessary, Name of Provider/Nurse Contacted:     Aysen Shieh D ,PharmD Clinical Pharmacist  12/29/2018  9:30 PM

## 2018-12-29 NOTE — ED Notes (Signed)
Informed Dr. Joni Fears after multiple attempts at gaining IV access and straight sticking for blood, unable to get adequate set of blood cultures. Per Dr. Joni Fears ok to start IV antibiotics without getting both sets of blood cultures.

## 2018-12-30 DIAGNOSIS — E1110 Type 2 diabetes mellitus with ketoacidosis without coma: Secondary | ICD-10-CM | POA: Diagnosis present

## 2018-12-30 DIAGNOSIS — E1140 Type 2 diabetes mellitus with diabetic neuropathy, unspecified: Secondary | ICD-10-CM | POA: Diagnosis present

## 2018-12-30 DIAGNOSIS — A419 Sepsis, unspecified organism: Principal | ICD-10-CM

## 2018-12-30 DIAGNOSIS — R509 Fever, unspecified: Secondary | ICD-10-CM | POA: Diagnosis present

## 2018-12-30 DIAGNOSIS — G9341 Metabolic encephalopathy: Secondary | ICD-10-CM | POA: Diagnosis not present

## 2018-12-30 DIAGNOSIS — Z7982 Long term (current) use of aspirin: Secondary | ICD-10-CM | POA: Diagnosis not present

## 2018-12-30 DIAGNOSIS — G92 Toxic encephalopathy: Secondary | ICD-10-CM | POA: Diagnosis present

## 2018-12-30 DIAGNOSIS — Z20828 Contact with and (suspected) exposure to other viral communicable diseases: Secondary | ICD-10-CM | POA: Diagnosis present

## 2018-12-30 DIAGNOSIS — D649 Anemia, unspecified: Secondary | ICD-10-CM | POA: Diagnosis present

## 2018-12-30 DIAGNOSIS — J440 Chronic obstructive pulmonary disease with acute lower respiratory infection: Secondary | ICD-10-CM | POA: Diagnosis present

## 2018-12-30 DIAGNOSIS — Z801 Family history of malignant neoplasm of trachea, bronchus and lung: Secondary | ICD-10-CM | POA: Diagnosis not present

## 2018-12-30 DIAGNOSIS — R4182 Altered mental status, unspecified: Secondary | ICD-10-CM | POA: Diagnosis not present

## 2018-12-30 DIAGNOSIS — E1111 Type 2 diabetes mellitus with ketoacidosis with coma: Secondary | ICD-10-CM | POA: Diagnosis not present

## 2018-12-30 DIAGNOSIS — E871 Hypo-osmolality and hyponatremia: Secondary | ICD-10-CM | POA: Diagnosis present

## 2018-12-30 DIAGNOSIS — E860 Dehydration: Secondary | ICD-10-CM | POA: Diagnosis present

## 2018-12-30 DIAGNOSIS — E785 Hyperlipidemia, unspecified: Secondary | ICD-10-CM | POA: Diagnosis present

## 2018-12-30 DIAGNOSIS — N179 Acute kidney failure, unspecified: Secondary | ICD-10-CM

## 2018-12-30 DIAGNOSIS — R401 Stupor: Secondary | ICD-10-CM

## 2018-12-30 DIAGNOSIS — R6520 Severe sepsis without septic shock: Secondary | ICD-10-CM | POA: Diagnosis present

## 2018-12-30 DIAGNOSIS — D689 Coagulation defect, unspecified: Secondary | ICD-10-CM | POA: Diagnosis present

## 2018-12-30 DIAGNOSIS — I10 Essential (primary) hypertension: Secondary | ICD-10-CM | POA: Diagnosis present

## 2018-12-30 DIAGNOSIS — Z87891 Personal history of nicotine dependence: Secondary | ICD-10-CM | POA: Diagnosis not present

## 2018-12-30 DIAGNOSIS — Z981 Arthrodesis status: Secondary | ICD-10-CM | POA: Diagnosis not present

## 2018-12-30 DIAGNOSIS — I639 Cerebral infarction, unspecified: Secondary | ICD-10-CM | POA: Diagnosis not present

## 2018-12-30 DIAGNOSIS — E876 Hypokalemia: Secondary | ICD-10-CM | POA: Diagnosis present

## 2018-12-30 DIAGNOSIS — J181 Lobar pneumonia, unspecified organism: Secondary | ICD-10-CM | POA: Diagnosis present

## 2018-12-30 DIAGNOSIS — I6381 Other cerebral infarction due to occlusion or stenosis of small artery: Secondary | ICD-10-CM | POA: Diagnosis present

## 2018-12-30 DIAGNOSIS — E875 Hyperkalemia: Secondary | ICD-10-CM | POA: Diagnosis present

## 2018-12-30 LAB — BASIC METABOLIC PANEL
Anion gap: 18 — ABNORMAL HIGH (ref 5–15)
Anion gap: 16 — ABNORMAL HIGH (ref 5–15)
Anion gap: 12 (ref 5–15)
Anion gap: 6 (ref 5–15)
BUN: 25 mg/dL — ABNORMAL HIGH (ref 8–23)
BUN: 25 mg/dL — ABNORMAL HIGH (ref 8–23)
BUN: 25 mg/dL — ABNORMAL HIGH (ref 8–23)
BUN: 21 mg/dL (ref 8–23)
CO2: 12 mmol/L — ABNORMAL LOW (ref 22–32)
CO2: 16 mmol/L — ABNORMAL LOW (ref 22–32)
CO2: 19 mmol/L — ABNORMAL LOW (ref 22–32)
CO2: 20 mmol/L — ABNORMAL LOW (ref 22–32)
Calcium: 8 mg/dL — ABNORMAL LOW (ref 8.9–10.3)
Calcium: 9.2 mg/dL (ref 8.9–10.3)
Calcium: 8.7 mg/dL — ABNORMAL LOW (ref 8.9–10.3)
Calcium: 8.5 mg/dL — ABNORMAL LOW (ref 8.9–10.3)
Chloride: 103 mmol/L (ref 98–111)
Chloride: 107 mmol/L (ref 98–111)
Chloride: 107 mmol/L (ref 98–111)
Chloride: 113 mmol/L — ABNORMAL HIGH (ref 98–111)
Creatinine, Ser: 1.79 mg/dL — ABNORMAL HIGH (ref 0.44–1.00)
Creatinine, Ser: 1.79 mg/dL — ABNORMAL HIGH (ref 0.44–1.00)
Creatinine, Ser: 1.66 mg/dL — ABNORMAL HIGH (ref 0.44–1.00)
Creatinine, Ser: 1.54 mg/dL — ABNORMAL HIGH (ref 0.44–1.00)
GFR calc Af Amer: 33 mL/min — ABNORMAL LOW (ref >60)
GFR calc Af Amer: 33 mL/min — ABNORMAL LOW (ref >60)
GFR calc Af Amer: 36 mL/min — ABNORMAL LOW (ref >60)
GFR calc Af Amer: 39 mL/min — ABNORMAL LOW (ref >60)
GFR calc non Af Amer: 28 mL/min — ABNORMAL LOW (ref >60)
GFR calc non Af Amer: 28 mL/min — ABNORMAL LOW (ref >60)
GFR calc non Af Amer: 31 mL/min — ABNORMAL LOW (ref >60)
GFR calc non Af Amer: 34 mL/min — ABNORMAL LOW (ref >60)
Glucose, Bld: 607 mg/dL (ref 70–99)
Glucose, Bld: 248 mg/dL — ABNORMAL HIGH (ref 70–99)
Glucose, Bld: 187 mg/dL — ABNORMAL HIGH (ref 70–99)
Glucose, Bld: 137 mg/dL — ABNORMAL HIGH (ref 70–99)
Potassium: 4 mmol/L (ref 3.5–5.1)
Potassium: 3.3 mmol/L — ABNORMAL LOW (ref 3.5–5.1)
Potassium: 3.4 mmol/L — ABNORMAL LOW (ref 3.5–5.1)
Potassium: 3.5 mmol/L (ref 3.5–5.1)
Sodium: 133 mmol/L — ABNORMAL LOW (ref 135–145)
Sodium: 139 mmol/L (ref 135–145)
Sodium: 138 mmol/L (ref 135–145)
Sodium: 139 mmol/L (ref 135–145)

## 2018-12-30 LAB — RESPIRATORY PANEL BY PCR

## 2018-12-30 LAB — CBC
HCT: 32.6 % — ABNORMAL LOW (ref 36.0–46.0)
HCT: 30.6 % — ABNORMAL LOW (ref 36.0–46.0)
Hemoglobin: 10.8 g/dL — ABNORMAL LOW (ref 12.0–15.0)
Hemoglobin: 10.3 g/dL — ABNORMAL LOW (ref 12.0–15.0)
MCH: 23.5 pg — ABNORMAL LOW (ref 26.0–34.0)
MCH: 23.7 pg — ABNORMAL LOW (ref 26.0–34.0)
MCHC: 33.1 g/dL (ref 30.0–36.0)
MCHC: 33.7 g/dL (ref 30.0–36.0)
MCV: 71 fL — ABNORMAL LOW (ref 80.0–100.0)
MCV: 70.3 fL — ABNORMAL LOW (ref 80.0–100.0)
Platelets: 302 10*3/uL (ref 150–400)
Platelets: 284 10*3/uL (ref 150–400)
RBC: 4.59 MIL/uL (ref 3.87–5.11)
RBC: 4.35 MIL/uL (ref 3.87–5.11)
RDW: 12.7 % (ref 11.5–15.5)
RDW: 12.9 % (ref 11.5–15.5)
WBC: 19.4 10*3/uL — ABNORMAL HIGH (ref 4.0–10.5)
WBC: 20.5 10*3/uL — ABNORMAL HIGH (ref 4.0–10.5)
nRBC: 0 % (ref 0.0–0.2)
nRBC: 0 % (ref 0.0–0.2)

## 2018-12-30 LAB — GLUCOSE, CAPILLARY
Glucose-Capillary: 120 mg/dL — ABNORMAL HIGH (ref 70–99)
Glucose-Capillary: 122 mg/dL — ABNORMAL HIGH (ref 70–99)
Glucose-Capillary: 123 mg/dL — ABNORMAL HIGH (ref 70–99)
Glucose-Capillary: 128 mg/dL — ABNORMAL HIGH (ref 70–99)
Glucose-Capillary: 142 mg/dL — ABNORMAL HIGH (ref 70–99)
Glucose-Capillary: 146 mg/dL — ABNORMAL HIGH (ref 70–99)
Glucose-Capillary: 152 mg/dL — ABNORMAL HIGH (ref 70–99)
Glucose-Capillary: 156 mg/dL — ABNORMAL HIGH (ref 70–99)
Glucose-Capillary: 159 mg/dL — ABNORMAL HIGH (ref 70–99)
Glucose-Capillary: 176 mg/dL — ABNORMAL HIGH (ref 70–99)
Glucose-Capillary: 181 mg/dL — ABNORMAL HIGH (ref 70–99)
Glucose-Capillary: 200 mg/dL — ABNORMAL HIGH (ref 70–99)
Glucose-Capillary: 259 mg/dL — ABNORMAL HIGH (ref 70–99)
Glucose-Capillary: 428 mg/dL — ABNORMAL HIGH (ref 70–99)
Glucose-Capillary: 81 mg/dL (ref 70–99)
Glucose-Capillary: >600 mg/dL (ref 70–99)
Glucose-Capillary: >600 mg/dL (ref 70–99)

## 2018-12-30 LAB — SARS CORONAVIRUS 2 (TAT 6-24 HRS): SARS Coronavirus 2: NEGATIVE

## 2018-12-30 LAB — LACTIC ACID, PLASMA
Lactic Acid, Venous: 2.3 mmol/L (ref 0.5–1.9)
Lactic Acid, Venous: 2.4 mmol/L (ref 0.5–1.9)
Lactic Acid, Venous: 2.6 mmol/L (ref 0.5–1.9)
Lactic Acid, Venous: 4.9 mmol/L (ref 0.5–1.9)
Lactic Acid, Venous: 5.3 mmol/L (ref 0.5–1.9)

## 2018-12-30 LAB — MAGNESIUM: Magnesium: 1.5 mg/dL — ABNORMAL LOW (ref 1.7–2.4)

## 2018-12-30 LAB — PHOSPHORUS: Phosphorus: 1.6 mg/dL — ABNORMAL LOW (ref 2.5–4.6)

## 2018-12-30 LAB — HIV ANTIBODY (ROUTINE TESTING W REFLEX): HIV Screen 4th Generation wRfx: NONREACTIVE

## 2018-12-30 MED ORDER — ACETAMINOPHEN 650 MG RE SUPP
650.0000 mg | RECTAL | Status: DC | PRN
  Administered 2018-12-30 (×2): 650 mg via RECTAL
  Filled 2018-12-30 (×2): qty 1

## 2018-12-30 MED ORDER — SODIUM CHLORIDE 0.9 % IV BOLUS
500.0000 mL | Freq: Once | INTRAVENOUS | Status: AC
  Administered 2018-12-30: 500 mL via INTRAVENOUS

## 2018-12-30 MED ORDER — INSULIN GLARGINE 100 UNIT/ML ~~LOC~~ SOLN
15.0000 [IU] | SUBCUTANEOUS | Status: DC
  Administered 2018-12-30 – 2019-01-02 (×3): 15 [IU] via SUBCUTANEOUS
  Filled 2018-12-30 (×5): qty 0.15

## 2018-12-30 MED ORDER — LORAZEPAM 2 MG/ML IJ SOLN
INTRAMUSCULAR | Status: AC
  Administered 2018-12-30: 2 mg
  Filled 2018-12-30: qty 1

## 2018-12-30 MED ORDER — DEXTROSE-NACL 5-0.9 % IV SOLN
INTRAVENOUS | Status: DC
  Administered 2018-12-30 (×2): via INTRAVENOUS

## 2018-12-30 MED ORDER — DEXTROSE 5 % IV SOLN
10.0000 mg/kg | Freq: Three times a day (TID) | INTRAVENOUS | Status: DC
  Administered 2018-12-30 – 2018-12-31 (×2): 600 mg via INTRAVENOUS
  Filled 2018-12-30 (×4): qty 12

## 2018-12-30 MED ORDER — LORAZEPAM 2 MG/ML IJ SOLN
2.0000 mg | Freq: Once | INTRAMUSCULAR | Status: DC
  Filled 2018-12-30: qty 1

## 2018-12-30 MED ORDER — MAGNESIUM SULFATE 2 GM/50ML IV SOLN
2.0000 g | Freq: Once | INTRAVENOUS | Status: AC
  Administered 2018-12-30: 2 g via INTRAVENOUS
  Filled 2018-12-30: qty 50

## 2018-12-30 MED ORDER — VANCOMYCIN HCL IN DEXTROSE 1-5 GM/200ML-% IV SOLN
1000.0000 mg | INTRAVENOUS | Status: DC
  Administered 2018-12-31: 1000 mg via INTRAVENOUS
  Filled 2018-12-30: qty 200

## 2018-12-30 MED ORDER — LORAZEPAM 2 MG/ML IJ SOLN
2.0000 mg | Freq: Once | INTRAMUSCULAR | Status: AC
  Administered 2018-12-30: 2 mg via INTRAVENOUS

## 2018-12-30 MED ORDER — LORAZEPAM 2 MG/ML IJ SOLN
INTRAMUSCULAR | Status: AC
  Filled 2018-12-30: qty 1

## 2018-12-30 MED ORDER — METRONIDAZOLE IN NACL 5-0.79 MG/ML-% IV SOLN
500.0000 mg | Freq: Three times a day (TID) | INTRAVENOUS | Status: DC
  Administered 2018-12-30 – 2019-01-01 (×7): 500 mg via INTRAVENOUS
  Filled 2018-12-30 (×10): qty 100

## 2018-12-30 MED ORDER — ASPIRIN EC 81 MG PO TBEC: 81.0000 mg | DELAYED_RELEASE_TABLET | Freq: Every day | ORAL | Status: DC

## 2018-12-30 MED ORDER — POTASSIUM PHOSPHATES 15 MMOLE/5ML IV SOLN
40.0000 meq | Freq: Once | INTRAVENOUS | Status: AC
  Administered 2018-12-31: 40 meq via INTRAVENOUS
  Filled 2018-12-30: qty 9.09

## 2018-12-30 MED ORDER — SODIUM CHLORIDE 0.9 % IV SOLN: 2.0000 g | INTRAVENOUS | Status: DC

## 2018-12-30 MED ORDER — ENOXAPARIN SODIUM 30 MG/0.3ML ~~LOC~~ SOLN
30.0000 mg | SUBCUTANEOUS | Status: DC
  Administered 2018-12-30: 30 mg via SUBCUTANEOUS
  Filled 2018-12-30: qty 0.3

## 2018-12-30 MED ORDER — INSULIN ASPART 100 UNIT/ML ~~LOC~~ SOLN
0.0000 [IU] | SUBCUTANEOUS | Status: DC
  Administered 2018-12-30 – 2019-01-01 (×8): 3 [IU] via SUBCUTANEOUS
  Administered 2019-01-01 (×2): 5 [IU] via SUBCUTANEOUS
  Administered 2019-01-01: 11 [IU] via SUBCUTANEOUS
  Administered 2019-01-02: 8 [IU] via SUBCUTANEOUS
  Administered 2019-01-02 (×2): 3 [IU] via SUBCUTANEOUS
  Administered 2019-01-02: 2 [IU] via SUBCUTANEOUS
  Administered 2019-01-02: 3 [IU] via SUBCUTANEOUS
  Administered 2019-01-02: 2 [IU] via SUBCUTANEOUS
  Administered 2019-01-03: 3 [IU] via SUBCUTANEOUS
  Administered 2019-01-03: 2 [IU] via SUBCUTANEOUS
  Filled 2018-12-30 (×19): qty 1

## 2018-12-30 MED ORDER — CLOPIDOGREL BISULFATE 75 MG PO TABS: 75.0000 mg | ORAL_TABLET | Freq: Every day | ORAL | Status: DC

## 2018-12-30 MED ORDER — LACTATED RINGERS IV SOLN
INTRAVENOUS | Status: DC
  Administered 2018-12-30 – 2018-12-31 (×4): via INTRAVENOUS

## 2018-12-30 MED ORDER — SODIUM CHLORIDE 0.9 % IV SOLN
1.0000 g | Freq: Four times a day (QID) | INTRAVENOUS | Status: DC
  Administered 2018-12-30 – 2018-12-31 (×3): 1 g via INTRAVENOUS
  Filled 2018-12-30 (×7): qty 1000
  Filled 2018-12-30: qty 1
  Filled 2018-12-30: qty 1000

## 2018-12-30 MED ORDER — KETOROLAC TROMETHAMINE 30 MG/ML IJ SOLN
15.0000 mg | Freq: Once | INTRAMUSCULAR | Status: AC
  Administered 2018-12-30: 15 mg via INTRAVENOUS
  Filled 2018-12-30: qty 1

## 2018-12-30 MED ORDER — SODIUM CHLORIDE 0.9 % IV SOLN
2.0000 g | Freq: Two times a day (BID) | INTRAVENOUS | Status: DC
  Administered 2018-12-30 – 2019-01-01 (×4): 2 g via INTRAVENOUS
  Filled 2018-12-30 (×3): qty 20
  Filled 2018-12-30 (×2): qty 2
  Filled 2018-12-30: qty 20

## 2018-12-30 MED ORDER — HALOPERIDOL LACTATE 5 MG/ML IJ SOLN
5.0000 mg | Freq: Once | INTRAMUSCULAR | Status: AC
  Administered 2018-12-30: 5 mg via INTRAVENOUS

## 2018-12-30 NOTE — ED Notes (Signed)
Pt had pulled IV line out that had insulin running in. New IV placed in left upper arm and insulin restarted

## 2018-12-30 NOTE — ED Notes (Signed)
Pt combative when staff attempting to start medications and place pt on monitor, resisting care, attempting to bite and swinging at staff. Attempted to verbally redirect pt without success. Orders from admitting MD for haldol were given

## 2018-12-30 NOTE — ED Notes (Signed)
Pt swinging at staff in room, pt pulling IVs and spitting. Pt unable to be handled by staff. Admitting MD not communicating with staff. Jacqualine Code, MD involved. See orders.

## 2018-12-30 NOTE — ED Notes (Signed)
Lorrie RN at bedside with pt.

## 2018-12-30 NOTE — ED Notes (Addendum)
Staff x4 in room. Pt has no IV access due to ripping out. Pt continuing to spit, yell and violently thrash towards staff. Bodily fluid, saliva and blood on staff due to pts actions. MD contacted with no answer. Charge nurse involved.

## 2018-12-30 NOTE — ED Notes (Signed)
Pt placed on 2L [place] per Admitting MD.

## 2018-12-30 NOTE — Progress Notes (Signed)
Pharmacy Antibiotic Note  Audrey Chan is a 68 y.o. female admitted on 12/29/2018 with AMS w/ pulmonary vascular congestion on CXR.  Pharmacy has been consulted for vanc/cefepime dosing.  Plan: Patient received a total of vanc 1.5g IV load and cefepime 2g IV x 1  Vancomycin 1000 mg IV Q 36 hrs. Goal AUC 400-550. Expected AUC: 528.0 SCr used: 1.79 Cssmin: 12.8  Will continue cefepime 2g IV q24h and will continue to monitor renal function and will adjust as needed.  Height: 5\' 8"  (172.7 cm) Weight: 132 lb 4.4 oz (60 kg) IBW/kg (Calculated) : 63.9  Temp (24hrs), Avg:104.2 F (40.1 C), Min:103.8 F (39.9 C), Max:104.7 F (40.4 C)  Recent Labs  Lab 12/29/18 2115 12/29/18 2357 12/30/18 0101 12/30/18 0214 12/30/18 0427  WBC 12.6*  --   --   --  19.4*  CREATININE 2.00*  --  1.79*  --  1.79*  LATICACIDVEN 3.4* 2.4*  --  5.3* 4.9*    Estimated Creatinine Clearance: 28.1 mL/min (A) (by C-G formula based on SCr of 1.79 mg/dL (H)).    No Known Allergies  Thank you for allowing pharmacy to be a part of this patient's care.  Tobie Lords, PharmD, BCPS Clinical Pharmacist 12/30/2018 5:53 AM

## 2018-12-30 NOTE — ED Provider Notes (Signed)
Patient has become highly agitated, pulling at IVs, thrashing about, requiring chemical sedation.  Was given Haldol earlier per nursing, still very agitated.  If patient request orders from the hospitalist, but as the patient is highly agitated we will trial 1 mg of IV Ativan now.  I personally evaluated the patient and she is quite agitated requiring hold for medication.  Additional IV Ativan 1 mg authorized by verbal in 5 minutes if patient remains agitated   Delman Kitten, MD 12/30/18 1939

## 2018-12-30 NOTE — ED Notes (Addendum)
On coming admitting NP, Bodenheimer called charge and spoke with this RN. See MAR for new interventions.

## 2018-12-30 NOTE — Progress Notes (Addendum)
Inpatient Diabetes Program Recommendations  AACE/ADA: New Consensus Statement on Inpatient Glycemic Control (2015)  Target Ranges:  Prepandial:   less than 140 mg/dL      Peak postprandial:   less than 180 mg/dL (1-2 hours)      Critically ill patients:  140 - 180 mg/dL   Results for GIAH, CHUE (MRN [id number]) as of 12/30/2018 06:25  Ref. Range 12/29/2018 21:15  Sodium Latest Ref Range: 135 - 145 mmol/L 123 (L)  Potassium Latest Ref Range: 3.5 - 5.1 mmol/L 5.6 (H)  Chloride Latest Ref Range: 98 - 111 mmol/L 85 (L)  CO2 Latest Ref Range: 22 - 32 mmol/L 16 (L)  Glucose Latest Ref Range: 70 - 99 mg/dL 965 (HH)  BUN Latest Ref Range: 8 - 23 mg/dL 27 (H)  Creatinine Latest Ref Range: 0.44 - 1.00 mg/dL 2.00 (H)  Calcium Latest Ref Range: 8.9 - 10.3 mg/dL 9.6  Anion gap Latest Ref Range: 5 - 15  22 (H)   Results for JAIMELYN, PLEVA (MRN [id number]) as of 12/30/2018 06:25  Ref. Range 12/29/2018 23:09 12/30/2018 00:14 12/30/2018 01:21 12/30/2018 02:48 12/30/2018 04:06 12/30/2018 05:34  Glucose-Capillary Latest Ref Range: 70 - 99 mg/dL >600 (HH) >600 (HH) >600 (HH) 428 (H) 259 (H) 146 (H)    Admit with: DKA/ Sepsis  History: DM, CVA, COPD  Home DM Meds: Lantus 14 units QHS (see below)       Apidra TID per home scale (see below)       Metformin XR 500 mg BID  Current Orders: IV Insulin Drip     MD- Do not see orders for Dextrose containing fluids.  Per DKA guidelines, fluids should be switched to either D5 NS or D5 1/2 NS once CBGs drop below 250 mg/dl.  Have reached out to RN caring for pt in the ED to try to get orders to switch fluids.  Addendum 6:47am--reached RN caring for pt by secure chat and RN to reach out to the MD to gets orders to switch IVF    Endocrinologist: Dr. Evalee Mutton with [facility] seen 11/08/2018  Pt's Insulin Pump was stopped by her ENDO on 10/11/2018 as she was not effectively using the pump.  Was switched to Lantus and Apidra Insulins back in August.     Was instructed to take the following at that visit in Sept: Lantus 14 units QHS Apidra Insulin in the following regimen: Take 5 units of Apidra if your sugar is 80 - 150 Take 6 units of Apidra if your sugar is 150-210 Take 7 units of Apidra if your sugar is 211-270 Take 7 units of Apidra if your sugar is 271-330 Take 8 units of Apidra if your sugar is 331-390 Take 9 units of Apidra if your sugar is over 391     --Will follow patient during hospitalization--  Wyn Quaker RN, MSN, CDE Diabetes Coordinator Inpatient Glycemic Control Team Team Pager: [phone] (8a-5p)

## 2018-12-30 NOTE — ED Notes (Addendum)
Pt becoming uncooperative and combative at this time. Pt has mitts in place. Pt trying to pull at lines. NP Bodenheimer made aware.

## 2018-12-30 NOTE — ED Notes (Signed)
MD notified of bladder scan and fever. See new orders.

## 2018-12-30 NOTE — ED Notes (Addendum)
Pt sleeping, has mitts in place, no restraints used at this time. Will continue to reassess as needed. NP made aware. Therapist, sports)

## 2018-12-30 NOTE — Progress Notes (Signed)
PROGRESS NOTE    MAKHIYA Chan  [id number] DOB: December 11, 1950 DOA: 12/29/2018 PCP: Theotis Burrow, MD      Brief Narrative:  Audrey Chan is a 68 y.o. F with DM, hx stroke, HTN who presented with AMS.  LSN night before admission, in AM her boyfriend noticed she was talking "out of her head", and when he returned home in the afternoon, found her confused, in bed covered in vomit or feces.  EMS arrived to find her combative, febrile to 104F and tachycardic.  In the ER, HR 140s, WBC 12.6K, glucose 965, anion gap 22, lactic acid 3.4.  UA clear but CXR showed bilateral infiltrates.  CT head unremarkable.  Given broad spectrum antibiotics, IV fluids, and started on insulin drip.  LP attempted but failed.        Assessment & Plan:  Acute metabolic encephalopathy Secondary to DKA and sepsis from unknown source.   Sepsis secondary to unknown source Admitted and started on Vanco, cefepime, and Flagyl.  Unclear source.  Still fevering.   BP elevated, HR still >110.    -Continue vancomycin, Flagyl -Stop cefepime, transition to meningitis dosing for ceftriaxone -Add acyclovir, ampicillin -Follow blood and urine cultures -Rule out Covid -Obtain lumbar puncture with Gram stain, culture, cell counts, HSV PCR -Obtain second blood culture   Diabetic ketoacidosis Diabetes Gap still 16 -Continue insulin drip -Every 4 BMP -Check mag, Phos -Hold home antidiabetics -Hold gabapentin  Acute kidney injury Urine bland, likely underperfusion injury.  Cr seems better this AM.  Platelets normal, not worried about TTP -Continue IV fluids -Strict I's and O's -Hold ACEi  Lactic acidosis Probably underperfusion from dehydration, sepsis.  LFTs normal, doubt poor clearance from liver disease.   -Hold metformin  Coagulopathy INR 1.6.  LFTs normal, no prior liver disease.   -Trend INR  Hypertension Cerebrovascular disease secondary prevention -Continue aspirin, Plavix when  able to take p.o. -Hold lisinopril -Hold Crestor -Hold HCTZ  Anemia  COPD -Hold Anoro for now  Hypomagnesemia Hypophosphatemia -Replete electrolytes               MDM and disposition: The below labs and imaging reports were reviewed and summarized above.  Medication management as above.  This is a severe illness that poses threat to life.  The patient was admitted with altered mental status, sepsis, DKA.    The patient is critically ill with multi-organ failure.  Critical care was necessary to treat or prevent imminent or life-threatening deterioration of sepsis, acute metabolic encephalopathy, and renal failure and was exclusive of separately billable procedures and treating other patients. Total critical care time spent by me: 55 minutes Time spent personally by me on obtaining history from patient or surrogate, evaluation of the patient, evaluation of patient's response to treatment, ordering and review of laboratory studies, ordering and review of radiographic studies, ordering and performing treatments and interventions, and re-evaluation of the patient's condition.        DVT prophylaxis: SCDs Code Status: Full code Family Communication: Significant other by phone    Consultants:     Procedures:   11/8 CT head-NAICP  11/8 attempted LP in ER--unsuccessful   Antimicrobials:   Vancomycin 11/8 >>  Cefepime 11/8 >> 11/9  Flagyl 11/8 >>  Ampicillin 11/9 >>  Acyclovir 11/9 >>  Ceftriaxone 11/9 >>   Subjective: Nursing report the patient mentation has been poor, she is unable to take anything by mouth.  She has been febrile throughout the day.  She is  not arousable to be able to answer questions.  Objective: Vitals:   12/30/18 1500 12/30/18 1500 12/30/18 1530 12/30/18 1630  BP: (!) 135/94  (!) 179/86 (!) 180/73  Pulse: (!) 103  91 91  Resp:      Temp:  (!) 101.1 F (38.4 C)    TempSrc:  Rectal    SpO2: 99%  98% 99%  Weight:       Height:        Intake/Output Summary (Last 24 hours) at 12/30/2018 1839 Last data filed at 12/30/2018 1831 Gross per 24 hour  Intake 2033.42 ml  Output 2500 ml  Net -466.58 ml   Filed Weights   12/29/18 2029  Weight: 60 kg    Examination: General appearance: Thin adult female, lying in bed, pulls covers back over her head when we attempt to examine her.  No obvious distress, appears still encephalopathic.    HEENT: Anicteric, conjunctiva pink, lids and lashes normal. No nasal deformity, discharge, epistaxis.  Lips moist, lips dry, oropharynx dry, blood crusting on the lips, does not open the mouth.   Skin: Warm and dry.  No jaundice.  No suspicious rashes or lesions. Cardiac: Tachycardic, regular, nl S1-S2, no murmurs appreciated.  Capillary refill is brisk.  JVP not visible.  No LE edema.  Radial  pulses 2+ and symmetric. Respiratory: Normal respiratory rate and rhythm.  CTAB without rales or wheezes. Abdomen: Abdomen soft.  Midline abdominal scar noted, old well-healed.  No grimace to palpation, no voluntary guarding, no rigidity, no rebound. No ascites, distension, hepatosplenomegaly.   MSK: No deformities or effusions. Neuro: Patient does not follow commands.  She appears to have 5/5 strength in the upper and lower extremities, but discoordination.  Otherwise does not cooperate in exam.  She appears to make purposeful movements, pointing sheets back of her head. Psych: She moans a little bit, but she does not make purposeful answers, diminished nor does she engage in conversation, her sensorium is dulled, her attention is diminished.       Data Reviewed: I have personally reviewed following labs and imaging studies:  CBC: Recent Labs  Lab 12/29/18 2115 12/30/18 0427 12/30/18 1230  WBC 12.6* 19.4* 20.5*  NEUTROABS 11.1*  --   --   HGB 11.4* 10.8* 10.3*  HCT 35.2* 32.6* 30.6*  MCV 73.5* 71.0* 70.3*  PLT 362 302 [id number]   Basic Metabolic Panel: Recent Labs  Lab  12/29/18 2115 12/30/18 0101 12/30/18 0427 12/30/18 0830 12/30/18 1205  NA 123* 133* 139 138 139  K 5.6* 4.0 3.3* 3.4* 3.5  CL 85* 103 107 107 113*  CO2 16* 12* 16* 19* 20*  GLUCOSE 965* 607* 248* 187* 137*  BUN 27* 25* 25* 25* 21  CREATININE 2.00* 1.79* 1.79* 1.66* 1.54*  CALCIUM 9.6 8.0* 9.2 8.7* 8.5*  MG  --   --   --  1.5*  --   PHOS  --   --   --  1.6*  --    GFR: Estimated Creatinine Clearance: 32.7 mL/min (A) (by C-G formula based on SCr of 1.54 mg/dL (H)). Liver Function Tests: Recent Labs  Lab 12/29/18 2115  AST 25  ALT 44  ALKPHOS 329*  BILITOT 1.1  PROT 8.6*  ALBUMIN 4.2   Recent Labs  Lab 12/29/18 2115  LIPASE 13   No results for input(s): AMMONIA in the last 168 hours. Coagulation Profile: Recent Labs  Lab 12/29/18 2115  INR 1.4*   Cardiac Enzymes: No results for  input(s): CKTOTAL, CKMB, CKMBINDEX, TROPONINI in the last 168 hours. BNP (last 3 results) No results for input(s): PROBNP in the last 8760 hours. HbA1C: No results for input(s): HGBA1C in the last 72 hours. CBG: Recent Labs  Lab 12/30/18 1157 12/30/18 1344 12/30/18 1456 12/30/18 1616 12/30/18 1739  GLUCAP 159* 128* 123* 122* 152*   Lipid Profile: No results for input(s): CHOL, HDL, LDLCALC, TRIG, CHOLHDL, LDLDIRECT in the last 72 hours. Thyroid Function Tests: No results for input(s): TSH, T4TOTAL, FREET4, T3FREE, THYROIDAB in the last 72 hours. Anemia Panel: No results for input(s): VITAMINB12, FOLATE, FERRITIN, TIBC, IRON, RETICCTPCT in the last 72 hours. Urine analysis:    Component Value Date/Time   COLORURINE YELLOW (A) 12/29/2018 2115   APPEARANCEUR HAZY (A) 12/29/2018 2115   APPEARANCEUR CLEAR 06/07/2014 1233   LABSPEC 1.020 12/29/2018 2115   LABSPEC 1.010 06/07/2014 1233   PHURINE 5.0 12/29/2018 2115   GLUCOSEU >=500 (A) 12/29/2018 2115   GLUCOSEU 500 mg/dL 06/07/2014 1233   HGBUR SMALL (A) 12/29/2018 2115   BILIRUBINUR NEGATIVE 12/29/2018 2115   BILIRUBINUR  NEGATIVE 06/07/2014 1233   KETONESUR 20 (A) 12/29/2018 2115   PROTEINUR 30 (A) 12/29/2018 2115   NITRITE NEGATIVE 12/29/2018 2115   LEUKOCYTESUR NEGATIVE 12/29/2018 2115   LEUKOCYTESUR NEGATIVE 06/07/2014 1233   Sepsis Labs: @LABRCNTIP (procalcitonin:4,lacticacidven:4)  ) Recent Results (from the past 240 hour(s))  Blood Culture (routine x 2)     Status: None (Preliminary result)   Collection Time: 12/29/18  9:15 PM   Specimen: BLOOD  Result Value Ref Range Status   Specimen Description BLOOD BLOOD RIGHT FOREARM  Final   Special Requests   Final    BOTTLES DRAWN AEROBIC AND ANAEROBIC Blood Culture adequate volume   Culture   Final    NO GROWTH < 12 HOURS Performed at [facility], [address] [place], [place] [address]    Report Status PENDING  Incomplete  SARS CORONAVIRUS 2 (TAT 6-24 HRS) Nasopharyngeal Nasopharyngeal Swab     Status: None   Collection Time: 12/29/18  9:15 PM   Specimen: Nasopharyngeal Swab  Result Value Ref Range Status   SARS Coronavirus 2 NEGATIVE NEGATIVE Final    Comment: (NOTE) SARS-CoV-2 target nucleic acids are NOT DETECTED. The SARS-CoV-2 RNA is generally detectable in upper and lower respiratory specimens during the acute phase of infection. Negative results do not preclude SARS-CoV-2 infection, do not rule out co-infections with other pathogens, and should not be used as the sole basis for treatment or other patient management decisions. Negative results must be combined with clinical observations, patient history, and epidemiological information. The expected result is Negative. Fact Sheet for Patients: SugarRoll.be Fact Sheet for Healthcare Providers: https://www.woods-mathews.com/ This test is not yet approved or cleared by the [place] FDA and  has been authorized for detection and/or diagnosis of SARS-CoV-2 by FDA under an Emergency Use Authorization (EUA). This EUA will remain   in effect (meaning this test can be used) for the duration of the COVID-19 declaration under Section 56 4(b)(1) of the Act, 21 U.S.C. section 360bbb-3(b)(1), unless the authorization is terminated or revoked sooner. Performed at [facility], [place] [address], [place], [place] [address]   Respiratory Panel by PCR     Status: None   Collection Time: 12/30/18 12:17 AM   Specimen: Nasopharyngeal Swab; Respiratory  Result Value Ref Range Status   Adenovirus NOT DETECTED NOT DETECTED Final   Coronavirus 229E NOT DETECTED NOT DETECTED Final  Comment: (NOTE) The Coronavirus on the Respiratory Panel, DOES NOT test for the novel  Coronavirus (2019 nCoV)    Coronavirus HKU1 NOT DETECTED NOT DETECTED Final   Coronavirus NL63 NOT DETECTED NOT DETECTED Final   Coronavirus OC43 NOT DETECTED NOT DETECTED Final   Metapneumovirus NOT DETECTED NOT DETECTED Final   Rhinovirus / Enterovirus NOT DETECTED NOT DETECTED Final   Influenza A NOT DETECTED NOT DETECTED Final   Influenza B NOT DETECTED NOT DETECTED Final   Parainfluenza Virus 1 NOT DETECTED NOT DETECTED Final   Parainfluenza Virus 2 NOT DETECTED NOT DETECTED Final   Parainfluenza Virus 3 NOT DETECTED NOT DETECTED Final   Parainfluenza Virus 4 NOT DETECTED NOT DETECTED Final   Respiratory Syncytial Virus NOT DETECTED NOT DETECTED Final   Bordetella pertussis NOT DETECTED NOT DETECTED Final   Chlamydophila pneumoniae NOT DETECTED NOT DETECTED Final   Mycoplasma pneumoniae NOT DETECTED NOT DETECTED Final    Comment: Performed at [facility], [place] [address], [place], [place] [address]         Radiology Studies: Ct Head Wo Contrast  Result Date: 12/29/2018 CLINICAL DATA:  Loss of consciousness EXAM: CT HEAD WITHOUT CONTRAST TECHNIQUE: Contiguous axial images were obtained from the base of the skull through the vertex without intravenous contrast. COMPARISON:  None. FINDINGS: Brain: No evidence of acute territorial infarction,  hemorrhage, hydrocephalus,extra-axial collection or mass lesion/mass effect. The ventricles are normal in size and contour. Low-attenuation changes in the deep white matter consistent with small vessel ischemia. Vascular: No hyperdense vessel or unexpected calcification. Skull: The skull is intact. No fracture or focal lesion identified. Sinuses/Orbits: The visualized paranasal sinuses and mastoid air cells are clear. The orbits and globes intact. Other: None IMPRESSION: No acute intracranial abnormality. Findings consistent with chronic small vessel ischemia Electronically Signed   By: Prudencio Pair M.D.   On: 12/29/2018 23:08   Dg Chest Port 1 View  Result Date: 12/29/2018 CLINICAL DATA:  Altered mental status EXAM: PORTABLE CHEST 1 VIEW COMPARISON:  November 12, 2016 FINDINGS: The heart size and mediastinal contours are within normal limits. There is prominence to the central pulmonary vasculature. No large airspace consolidation or pleural effusion. IMPRESSION: Findings suggestive pulmonary vascular congestion. Electronically Signed   By: Prudencio Pair M.D.   On: 12/29/2018 21:16        Scheduled Meds:  haloperidol lactate  5 mg Intravenous Once   insulin aspart  0-15 Units Subcutaneous Q4H   insulin glargine  15 Units Subcutaneous Q24H   Continuous Infusions:  acyclovir     ampicillin (OMNIPEN) IV     cefTRIAXone (ROCEPHIN)  IV     insulin 0.9 mL/hr at 12/30/18 1740   lactated ringers     metronidazole Stopped (12/30/18 0833)   potassium PHOSPHATE IVPB (mEq)     [START ON 12/31/2018] vancomycin       LOS: 0 days    Time spent: [place], MD [facility] 12/30/2018, 6:39 PM

## 2018-12-30 NOTE — ED Notes (Addendum)
Pt sister contact info is- [place]  [phone]

## 2018-12-30 NOTE — H&P (Addendum)
History and Physical    Audrey Chan [id number] DOB: 23-Jan-1951 DOA: 12/29/2018  PCP: Theotis Burrow, MD  Patient coming from: Home via EMS  I have personally briefly reviewed patient's old medical records in [place]  Chief Complaint: AMS  HPI: Audrey Chan is a 68 y.o. female with medical history significant of Type 2 diabetes with neuropathy,CVA, hypertension, COPD, hyperlipidemia, who presents reportedly with altered mental status. Pt is obtunded and unable to provide history. No family at bedside. Reportedly patient was noted to be in her normal state of health this morning but was later found by neighbors in the afternoon with altered mental status and EMS was called. Patient was combative with EMS and had to be chemically restraint with 5mg  of Haldol. She was initially febrile up to 104.1 rectally. She was tachycardic up to 140s with BP up to 170s/146. Leukocytosis of 12.6. Na of 123. K of 5.6. Glucose of 965. Creatinine of 2 from a prior of 1.19 two months ago. Alkaline phosphatase of 329. PH of 7.3, bicarb of 16, Anion gap of 22. Lactic acid of 3.4. PCT of 0.25. UA negative for leukocytes or nitrate.  CXR shows pulmonary vascular congestion.  CT head showed no acute intracranial abnormalities.  LP was attempted by ED physician Dr. Joni Fears but was unsuccessful. Broad Spectrum antibiotics was started.     Review of Systems: Unable to obtain due to patient's altered mental status.   Past Medical History:  Diagnosis Date  . Allergic rhinitis   . Aneurysm of unspecified site ([place])   . Cholelithiasis   . Diabetes type 2, controlled ([place])   . GERD (gastroesophageal reflux disease)   . Hypertension   . Neuropathy   . Veno-occlusive disease     Past Surgical History:  Procedure Laterality Date  . ABDOMINAL HYSTERECTOMY  1972  . CERVICAL FUSION    . CHOLECYSTECTOMY N/A 12/28/2014   Procedure: LAPAROSCOPIC CHOLECYSTECTOMY;  Surgeon: Sherri Rad, MD;   Location: [facility] ORS;  Service: General;  Laterality: N/A;  . EYE SURGERY     detached retina  . MASS EXCISION Left 05/06/2018   Procedure: EXCISION OF LEFT ANTECUBITAL CYST, DIABETIC;  Surgeon: Vickie Epley, MD;  Location: [facility] ORS;  Service: Vascular;  Laterality: Left;     reports that she quit smoking about 19 years ago. She has never used smokeless tobacco. She reports that she does not drink alcohol or use drugs.  No Known Allergies  Family History  Problem Relation Age of Onset  . Lung cancer Father     Family history reviewed and not pertinent   Prior to Admission medications   Medication Sig Start Date End Date Taking? Authorizing Provider  acetaminophen (TYLENOL) 500 MG tablet Take 1,000 mg by mouth every 6 (six) hours as needed for moderate pain.  05/17/12   [provider]  albuterol (PROVENTIL HFA;VENTOLIN HFA) 108 (90 BASE) MCG/ACT inhaler Inhale 2 puffs into the lungs every 6 (six) hours as needed for wheezing or shortness of breath.     [provider]  aspirin EC 81 MG tablet Take 81 mg by mouth daily.    [provider]  atorvastatin (LIPITOR) 40 MG tablet Take 40 mg by mouth daily at 6 PM.  11/16/14   [provider]  cetirizine (ZYRTEC) 10 MG tablet Take 10 mg by mouth daily.     [provider]  Cholecalciferol (VITAMIN D3) 1000 units CAPS Take 1,000 Units by mouth daily.  [provider]  diclofenac sodium (VOLTAREN) 1 % GEL Apply 2 g topically 4 (four) times daily. Patient taking differently: Apply 2 g topically 4 (four) times daily as needed (joint pain).  04/15/16   Laban Emperor, PA-C  ferrous sulfate 325 (65 FE) MG tablet Take 325 mg by mouth 2 (two) times daily with a meal.     [provider]  fluticasone (FLONASE) 50 MCG/ACT nasal spray Place 2 sprays into both nostrils 2 (two) times daily.     [provider]  gabapentin (NEURONTIN) 800 MG tablet Take 800 mg by mouth 3 (three) times  daily.  12/26/16   [provider]  hydrochlorothiazide (HYDRODIURIL) 25 MG tablet Take 25 mg by mouth daily.    [provider]  IBU 800 MG tablet Take 800 mg by mouth every 8 (eight) hours as needed for moderate pain.  01/08/17   [provider]  insulin glulisine (APIDRA) 100 UNIT/ML injection Inject 1-50 Units into the skin continuous. Continuously via pump     [provider]  ipratropium (ATROVENT HFA) 17 MCG/ACT inhaler Inhale 2 puffs into the lungs every 6 (six) hours as needed for wheezing.     [provider]  lisinopril (PRINIVIL,ZESTRIL) 10 MG tablet Take 10 mg by mouth daily.    [provider]  metFORMIN (GLUCOPHAGE-XR) 500 MG 24 hr tablet Take 500 mg by mouth 2 (two) times daily.    [provider]  methocarbamol (ROBAXIN) 500 MG tablet Take 500 mg by mouth 2 (two) times daily as needed for muscle spasms.     [provider]  mometasone (ELOCON) 0.1 % cream Apply 1 application topically 2 (two) times daily as needed (rash).  10/08/17   [provider]  Multiple Vitamin (MULTI-VITAMINS) TABS Take 1 tablet by mouth daily.    [provider]  mupirocin ointment (BACTROBAN) 2 % Apply to wound after soaking BID Patient taking differently: Apply 1 application topically 2 (two) times daily as needed (wound).  09/26/17   Hyatt, Max T, DPM  polyethylene glycol powder (GLYCOLAX/MIRALAX) powder Take 0.5 Containers by mouth daily as needed for moderate constipation.  10/08/15   [provider]  ranitidine (ZANTAC) 150 MG tablet Take 150 mg by mouth daily.  05/03/15   [provider]  rosuvastatin (CRESTOR) 20 MG tablet Take 20 mg by mouth daily.  05/02/13   [provider]    Physical Exam: Vitals:   12/29/18 2300 12/29/18 2315 12/29/18 2330 12/29/18 2359  BP: (!) 150/92  (!) 175/146   Pulse: (!) 133 (!) 131 (!) 140   Resp: (!) 24 (!) 32 (!) 30   Temp:    (!) 104.1 F (40.1 C)   TempSrc:    Rectal  SpO2: 93% 92% 96%   Weight:      Height:        Constitutional: elderly thin female with eyes closed with generalized rigors in bed. Did not awake to voice or light touch.  Vitals:   12/29/18 2300 12/29/18 2315 12/29/18 2330 12/29/18 2359  BP: (!) 150/92  (!) 175/146   Pulse: (!) 133 (!) 131 (!) 140   Resp: (!) 24 (!) 32 (!) 30   Temp:    (!) 104.1 F (40.1 C)  TempSrc:    Rectal  SpO2: 93% 92% 96%   Weight:      Height:       Eyes: PERRL, lids and conjunctivae normal ENMT: Mucous membranes are moist.  Neck: normal, supple, no masses Respiratory: clear to auscultation bilaterally, no wheezing, no crackles. Normal respiratory effort. No accessory muscle use.  Cardiovascular: sinus Tachycardiac on telemetry of 130 , no murmurs / rubs / gallops. No extremity edema. 2+ pedal pulses.  Abdomen: no tenderness, no masses palpated. Bowel sounds positive.  Musculoskeletal: no clubbing / cyanosis. No joint deformity upper and lower extremities. no contractures. Normal muscle tone.  Skin: no rashes, lesions, ulcers. No induration Neurologic: response to noxious stimuli but otherwise obtunded. Psychiatric: unable to access.      Labs on Admission: I have personally reviewed following labs and imaging studies  CBC: Recent Labs  Lab 12/29/18 2115  WBC 12.6*  NEUTROABS 11.1*  HGB 11.4*  HCT 35.2*  MCV 73.5*  PLT 123XX123   Basic Metabolic Panel: Recent Labs  Lab 12/29/18 2115  NA 123*  K 5.6*  CL 85*  CO2 16*  GLUCOSE 965*  BUN 27*  CREATININE 2.00*  CALCIUM 9.6   GFR: Estimated Creatinine Clearance: 25.1 mL/min (A) (by C-G formula based on SCr of 2 mg/dL (H)). Liver Function Tests: Recent Labs  Lab 12/29/18 2115  AST 25  ALT 44  ALKPHOS 329*  BILITOT 1.1  PROT 8.6*  ALBUMIN 4.2   Recent Labs  Lab 12/29/18 2115  LIPASE 13   No results for input(s): AMMONIA in the last 168 hours. Coagulation Profile: Recent Labs  Lab 12/29/18 2115  INR  1.4*   Cardiac Enzymes: No results for input(s): CKTOTAL, CKMB, CKMBINDEX, TROPONINI in the last 168 hours. BNP (last 3 results) No results for input(s): PROBNP in the last 8760 hours. HbA1C: No results for input(s): HGBA1C in the last 72 hours. CBG: Recent Labs  Lab 12/29/18 2309  GLUCAP >600*   Lipid Profile: No results for input(s): CHOL, HDL, LDLCALC, TRIG, CHOLHDL, LDLDIRECT in the last 72 hours. Thyroid Function Tests: No results for input(s): TSH, T4TOTAL, FREET4, T3FREE, THYROIDAB in the last 72 hours. Anemia Panel: No results for input(s): VITAMINB12, FOLATE, FERRITIN, TIBC, IRON, RETICCTPCT in the last 72 hours. Urine analysis:    Component Value Date/Time   COLORURINE YELLOW (A) 12/29/2018 2115   APPEARANCEUR HAZY (A) 12/29/2018 2115   APPEARANCEUR CLEAR 06/07/2014 1233   LABSPEC 1.020 12/29/2018 2115   LABSPEC 1.010 06/07/2014 1233   PHURINE 5.0 12/29/2018 2115   GLUCOSEU >=500 (A) 12/29/2018 2115   GLUCOSEU 500 mg/dL 06/07/2014 1233   HGBUR SMALL (A) 12/29/2018 2115   BILIRUBINUR NEGATIVE 12/29/2018 2115   BILIRUBINUR NEGATIVE 06/07/2014 1233   KETONESUR 20 (A) 12/29/2018 2115   PROTEINUR 30 (A) 12/29/2018 2115   NITRITE NEGATIVE 12/29/2018 2115   LEUKOCYTESUR NEGATIVE 12/29/2018 2115   LEUKOCYTESUR NEGATIVE 06/07/2014 1233    Radiological Exams on Admission: Ct Head Wo Contrast  Result Date: 12/29/2018 CLINICAL DATA:  Loss of consciousness EXAM: CT HEAD WITHOUT CONTRAST TECHNIQUE: Contiguous axial images were obtained from the base of the skull through the vertex without intravenous contrast. COMPARISON:  None. FINDINGS: Brain: No evidence of acute territorial infarction, hemorrhage, hydrocephalus,extra-axial collection or mass lesion/mass effect. The ventricles are normal in size and contour. Low-attenuation changes in the deep white matter consistent with small vessel ischemia. Vascular: No hyperdense vessel or unexpected calcification. Skull: The skull is  intact. No fracture or focal lesion identified. Sinuses/Orbits: The visualized paranasal sinuses and mastoid air cells are clear. The orbits and globes intact. Other: None IMPRESSION: No acute intracranial abnormality. Findings consistent with chronic small vessel ischemia Electronically Signed  By: Prudencio Pair M.D.   On: 12/29/2018 23:08   Dg Chest Port 1 View  Result Date: 12/29/2018 CLINICAL DATA:  Altered mental status EXAM: PORTABLE CHEST 1 VIEW COMPARISON:  November 12, 2016 FINDINGS: The heart size and mediastinal contours are within normal limits. There is prominence to the central pulmonary vasculature. No large airspace consolidation or pleural effusion. IMPRESSION: Findings suggestive pulmonary vascular congestion. Electronically Signed   By: Prudencio Pair M.D.   On: 12/29/2018 21:16    EKG: Independently reviewed.   Assessment/Plan Acute encephalopathy secondary to diabetic ketoacidosis and unknown infectious source  -CT head negative -LP was attempted by ED physician but was unsuccessful - replete potassium - continue insulin gtt with goal of 140-180 and AG <12 - IV NS until BG <250, then switch to D5 1/2 NS  - BMP q4hr  - keep NPO  Sepsis secondary unknown source - Febrile up to 104 rectally - negative CXR. Low PCT. Negative UA.  - negative head CT - LP attempted by ED physician but was unsuccessful - blood culture x 1 is pending (nursing was unable to get 2nd set after attempt x4 and IV antibiotics was started) - urine culture pending - COVID test pending - obtain respiratory viral panel  - continue IV vancomycin, cefepime and flagyl   Pseudonatremia - due to hyperglycemia  -Will improve with aggressive fluid.  follow serial BMP.   Pseudohyperkalemia -Due to osmotic shift. Will improve with aggressive fluid.  Follow serial BMP.   AKI - creatinine of 2 with a prior of 1.19 in August  - pre-renal - continue with aggressive fluid resuscitation - follow BMP -  Avoid nephrotoxic agent  DVT prophylaxis:.Lovenox Code Status:Full Family Communication: No family at bedside disposition Plan: Home with at least 2 midnight stays  Consults called:  Admission status: inpatient   Azaleah Usman T Gerold Sar DO [facility]   If 7PM-7AM, please contact night-coverage [email or web address] Password [email or web address]  12/30/2018, 12:05 AM

## 2018-12-31 ENCOUNTER — Inpatient Hospital Stay: Payer: Medicare PPO

## 2018-12-31 ENCOUNTER — Other Ambulatory Visit: Payer: Medicare PPO

## 2018-12-31 DIAGNOSIS — R4182 Altered mental status, unspecified: Secondary | ICD-10-CM

## 2018-12-31 LAB — GLUCOSE, CAPILLARY
Glucose-Capillary: 92 mg/dL (ref 70–99)
Glucose-Capillary: 184 mg/dL — ABNORMAL HIGH (ref 70–99)
Glucose-Capillary: 171 mg/dL — ABNORMAL HIGH (ref 70–99)
Glucose-Capillary: 161 mg/dL — ABNORMAL HIGH (ref 70–99)
Glucose-Capillary: 153 mg/dL — ABNORMAL HIGH (ref 70–99)
Glucose-Capillary: 81 mg/dL (ref 70–99)

## 2018-12-31 LAB — BASIC METABOLIC PANEL
Anion gap: 9 (ref 5–15)
BUN: 17 mg/dL (ref 8–23)
CO2: 19 mmol/L — ABNORMAL LOW (ref 22–32)
Calcium: 8.2 mg/dL — ABNORMAL LOW (ref 8.9–10.3)
Chloride: 111 mmol/L (ref 98–111)
Creatinine, Ser: 1.27 mg/dL — ABNORMAL HIGH (ref 0.44–1.00)
GFR calc Af Amer: 50 mL/min — ABNORMAL LOW (ref >60)
GFR calc non Af Amer: 43 mL/min — ABNORMAL LOW (ref >60)
Glucose, Bld: 186 mg/dL — ABNORMAL HIGH (ref 70–99)
Potassium: 3.1 mmol/L — ABNORMAL LOW (ref 3.5–5.1)
Sodium: 139 mmol/L (ref 135–145)

## 2018-12-31 LAB — CBC
HCT: 29.8 % — ABNORMAL LOW (ref 36.0–46.0)
Hemoglobin: 9.7 g/dL — ABNORMAL LOW (ref 12.0–15.0)
MCH: 23.5 pg — ABNORMAL LOW (ref 26.0–34.0)
MCHC: 32.6 g/dL (ref 30.0–36.0)
MCV: 72.3 fL — ABNORMAL LOW (ref 80.0–100.0)
Platelets: 237 10*3/uL (ref 150–400)
RBC: 4.12 MIL/uL (ref 3.87–5.11)
RDW: 13.1 % (ref 11.5–15.5)
WBC: 18 10*3/uL — ABNORMAL HIGH (ref 4.0–10.5)
nRBC: 0 % (ref 0.0–0.2)

## 2018-12-31 LAB — HEPATIC FUNCTION PANEL
ALT: 45 U/L — ABNORMAL HIGH (ref 0–44)
AST: 81 U/L — ABNORMAL HIGH (ref 15–41)
Albumin: 3 g/dL — ABNORMAL LOW (ref 3.5–5.0)
Alkaline Phosphatase: 203 U/L — ABNORMAL HIGH (ref 38–126)
Bilirubin, Direct: <0.1 mg/dL (ref 0.0–0.2)
Total Bilirubin: 0.5 mg/dL (ref 0.3–1.2)
Total Protein: 6.4 g/dL — ABNORMAL LOW (ref 6.5–8.1)

## 2018-12-31 LAB — URINE CULTURE: Culture: NO GROWTH

## 2018-12-31 LAB — DIFFERENTIAL
Abs Immature Granulocytes: 0.06 10*3/uL (ref 0.00–0.07)
Basophils Absolute: 0.2 10*3/uL — ABNORMAL HIGH (ref 0.0–0.1)
Basophils Relative: 1 %
Eosinophils Absolute: 0.4 10*3/uL (ref 0.0–0.5)
Eosinophils Relative: 2 %
Immature Granulocytes: 0 %
Lymphocytes Relative: 16 %
Lymphs Abs: 2.8 10*3/uL (ref 0.7–4.0)
Monocytes Absolute: 1.5 10*3/uL — ABNORMAL HIGH (ref 0.1–1.0)
Monocytes Relative: 8 %
Neutro Abs: 13 10*3/uL — ABNORMAL HIGH (ref 1.7–7.7)
Neutrophils Relative %: 73 %

## 2018-12-31 LAB — MRSA PCR SCREENING: MRSA by PCR: NEGATIVE

## 2018-12-31 MED ORDER — CHLORHEXIDINE GLUCONATE CLOTH 2 % EX PADS
6.0000 | MEDICATED_PAD | Freq: Every day | CUTANEOUS | Status: DC
  Administered 2018-12-31 – 2019-01-02 (×3): 6 via TOPICAL

## 2018-12-31 MED ORDER — DEXTROSE 5 % IV SOLN
10.0000 mg/kg | Freq: Two times a day (BID) | INTRAVENOUS | Status: DC
  Administered 2018-12-31 – 2019-01-01 (×2): 600 mg via INTRAVENOUS
  Filled 2018-12-31 (×5): qty 12

## 2018-12-31 MED ORDER — LABETALOL HCL 5 MG/ML IV SOLN
10.0000 mg | Freq: Four times a day (QID) | INTRAVENOUS | Status: DC | PRN
  Administered 2018-12-31 – 2019-01-02 (×5): 10 mg via INTRAVENOUS
  Filled 2018-12-31 (×5): qty 4

## 2018-12-31 MED ORDER — SODIUM CHLORIDE 0.9 % IV SOLN
2.0000 g | Freq: Four times a day (QID) | INTRAVENOUS | Status: DC
  Administered 2018-12-31 – 2019-01-01 (×5): 2 g via INTRAVENOUS
  Filled 2018-12-31 (×2): qty 2
  Filled 2018-12-31 (×4): qty 2000
  Filled 2018-12-31: qty 2
  Filled 2018-12-31 (×2): qty 2000

## 2018-12-31 MED ORDER — VANCOMYCIN HCL IN DEXTROSE 750-5 MG/150ML-% IV SOLN: 750.0000 mg | INTRAVENOUS | Status: DC

## 2018-12-31 MED ORDER — POTASSIUM CHLORIDE 10 MEQ/100ML IV SOLN
10.0000 meq | INTRAVENOUS | Status: AC
  Administered 2018-12-31 (×3): 10 meq via INTRAVENOUS
  Filled 2018-12-31 (×3): qty 100

## 2018-12-31 MED ORDER — HYDRALAZINE HCL 20 MG/ML IJ SOLN
5.0000 mg | Freq: Four times a day (QID) | INTRAMUSCULAR | Status: DC | PRN
  Administered 2018-12-31: 5 mg via INTRAVENOUS
  Filled 2018-12-31: qty 1

## 2018-12-31 MED ORDER — HALOPERIDOL LACTATE 5 MG/ML IJ SOLN
INTRAMUSCULAR | Status: AC
  Administered 2018-12-31: 2 mg via INTRAVENOUS
  Filled 2018-12-31: qty 1

## 2018-12-31 MED ORDER — IPRATROPIUM-ALBUTEROL 0.5-2.5 (3) MG/3ML IN SOLN: 3.0000 mL | RESPIRATORY_TRACT | Status: DC | PRN

## 2018-12-31 MED ORDER — VANCOMYCIN HCL IN DEXTROSE 1-5 GM/200ML-% IV SOLN
1000.0000 mg | INTRAVENOUS | Status: DC
  Administered 2019-01-01: 1000 mg via INTRAVENOUS
  Filled 2018-12-31 (×2): qty 200

## 2018-12-31 MED ORDER — SODIUM CHLORIDE 0.9 % IV SOLN
INTRAVENOUS | Status: DC | PRN
  Administered 2018-12-31 – 2019-01-03 (×2): 250 mL via INTRAVENOUS

## 2018-12-31 MED ORDER — HALOPERIDOL LACTATE 5 MG/ML IJ SOLN
2.0000 mg | Freq: Once | INTRAMUSCULAR | Status: AC
  Administered 2018-12-31: 13:00:00 2 mg via INTRAVENOUS

## 2018-12-31 NOTE — ED Notes (Signed)
Report given to Megan, RN.

## 2018-12-31 NOTE — ED Notes (Signed)
This RN to bedside, medications administered per MD order. Repeat rectal temp obtained. Pt remains with 1:1 sitter at this time. Pt remains confused, sleeping in bed however awakens with stimuli, repeatedly states "stop it, stop it". Pt back to sleep when not stimuli. Will continue to monitor for furhter patient needs.

## 2018-12-31 NOTE — ED Notes (Signed)
Pt sister in visiting with pt.

## 2018-12-31 NOTE — Progress Notes (Signed)
Xcover Per Programmer, systems no longer needed for safety

## 2018-12-31 NOTE — ED Notes (Signed)
CBG 153 

## 2018-12-31 NOTE — ED Notes (Signed)
Unable to obtain new labs. Pt has 3 PIVs, all flush but do not draw back. Pt remains confused and combative, unable to collect labs via peripheral stick. Lab notified to add magnesium and phosphorus onto previously collected tube and attempt collection of remaining tubes.

## 2018-12-31 NOTE — OR Nursing (Signed)
Pt admission medication list indicates pt on Plavix unable to determine last dose taken at home. No doses received in hospital  Lumbar puncture cant be done until after 5 missed doses. Dr Posey Pronto the radiologist notified. ED nurse notified. Thayer Jew (pt boyfriend) called and he was unable to tell if she took her plavix as ordered. Pharmacy called [address] [facility] called. They reported she had 90 day bottle filled in September. Earliest day she can have procedure is Nov 14th.

## 2018-12-31 NOTE — ED Notes (Signed)
Pt resting in bed at this time, NAD Noted, pt with 1:1 sitter in place for patient safety, pt with mittens on due to patient continuously pulling at IV's. Pt noted to be comfortable, not moving around in bed, seizure pads in place at this time for safety. Will continue to monitor for further changes in patient condition.

## 2018-12-31 NOTE — Progress Notes (Signed)
PHARMACY NOTE:  ANTIMICROBIAL RENAL DOSAGE ADJUSTMENT  Current antimicrobial regimen includes a mismatch between antimicrobial dosage and estimated renal function.  As per policy approved by the Pharmacy & Therapeutics and Medical Executive Committees, the antimicrobial dosage will be adjusted accordingly.  Current antimicrobial dosage:   -Ampicillin 1 g q6h  -Acyclovir 10 mg/kg q8h  Indication: Empiric coverage of meningitis   Renal Function:  Estimated Creatinine Clearance: 39.6 mL/min (A) (by C-G formula based on SCr of 1.27 mg/dL (H)).     Antimicrobial dosage has been changed to:  -Ampicillin 2 g q6h -Acyclovir 10 mg/kg q12h  Additional comments: Antibiotics adjusted per renal function to reflect recommended dosing in meningitis   Thank you for allowing pharmacy to be a part of this patient's care.  Benita Gutter, [facility]  Pharmacy Resident 12/31/2018 11:04 AM

## 2018-12-31 NOTE — Progress Notes (Addendum)
PROGRESS NOTE    [name]  [id number] DOB: 02/14/1951 DOA: 12/29/2018 PCP: Theotis Burrow, MD      Brief Narrative:  Audrey Chan is a 68 y.o. F with DM, hx stroke, HTN who presented with AMS.  LSN night before admission, in AM her boyfriend noticed she was talking "out of her head", and when he returned home in the afternoon, found her confused, in bed covered in vomit or feces.  EMS arrived to find her combative, febrile to 104F and tachycardic.  In the ER, HR 140s, WBC 12.6K, glucose 965, anion gap 22, lactic acid 3.4.  UA clear but CXR showed bilateral infiltrates.  CT head unremarkable.  Given broad spectrum antibiotics, IV fluids, and started on insulin drip.  LP attempted but failed.        Assessment & Plan:  Acute Metabolic Encephalopathy Differential includes metabolic encpehalopathy from DKA, volume depletion/electrolyte disturbances, sepsis of unclear etiology, glucose related neuro injury/cognitive impairment, encephalitis, meningitis, sub-clinical seizure.  CVA ruled out with repeat CT head at 48 hours.    HIV negative. Respiratory viral panel / COVID negative. Urine culture negative. BC pending.    LP to be done Friday 11/13 after Plavix washout 5 days.  -Continue vancomycin, ceftriaxone, ampicillin, acyclovir empirically -Consult Neurology, appreciate cares -q4 neuro checks -Obtain LP -Continue IV fluids until able to take PO    Fever, suspected sepsis Fever 104F on admission, tachycardic, encephalopathic, acute kidney injury.  Now seems to be slowly resolving in last 24 hours.  Only source at present seems to be possible meningitis.    -Continue empiric meningitis coverage as above   Diabetic Ketoacidosis Diabetes AG 22 on Admit, CBG 965 with ketonuria. Bicarb 16.  Started on insulin drip initially.  Transitioned to subQ remgimen after 24 hours, glucoses now well controlled. -Continue Lantus -Continue SS corrections -Daily BMP  -Hold home gabapentin, metformin  Acute Kidney Injury Cr 2.0 at admission.  Urine bland, likely underperfusion injury.    Cr 1.27 today.  Platelets normal, not worried about TTP. -Continue IVF -Strict I/O's  -Hold ACEi  Lactic Acidosis Probably underperfusion from dehydration, sepsis.  LFTs mildly elevated, doubt poor clearance from liver disease.   -Hold metformin  -Trend LFT's intermittently  -Check acetaminophen, ammonia   Coagulopathy INR 1.6.  LFTs normal, no prior liver disease.   -Repeat INR  Hypertension Cerebrovascular disease secondary prevention Recent stroke in August at [place].  No residual deficits.   -Labetalol and hydralazine PRN for severe range pressures -Resume home lisinopril, HCTZ, crestor when able to take PO -Resume aspirin after LP, takes this MWF -Resume Palvix after LP  Anemia Hgb trending down to baseline (10 g/dL at discharge from [facility]) with fluids. -Resume home ferrous sulfate when taking PO's   COPD -Resume Anoro when more alert -Continue Duoneb PRN   Hypomagnesemia Hypokalemia  Hypophosphatemia -Supplement K and phos today     DVT prophylaxis: SCDs Code Status: Full code Family Communication: Sister - [place]  At the bedside   Consultants:   IR  neurology  Procedures:   11/8 CT head-NAICP  11/8 attempted LP in ER--unsuccessful  11/10 CT head repeat -- NAICP   11/13 LP >>    Antimicrobials:   Vancomycin 11/8 >>  Cefepime 11/8 >> 11/9  Flagyl 11/8 >>  Ampicillin 11/9 >>  Acyclovir 11/9 >>  Ceftriaxone 11/9 >>  Cultures:  RVP 11/9 >> negative  COVID 11/9 >> negative   UA 11/8 >> leukocytes neg,  nitrite neg, no bacteria, 0-5 WBC.  Greater 500 glucose, ketones 20, protein 30  UC 11/8 >> negative   BCx2 11/8 >>   BCx1 11/9 >>   Subjective: Still very sedated, but able to wake up briefly today. Still very somnolent, confused.  Still quite rigid.  No more fever.  Spitting at staff at times.   Received haldol overnight, pt attempting to get out of bed, pulled out IV's, requiring mittens / haldol 5mg  administration x1, lorazepam 2mg  x1,.  No fever  Objective: Vitals:   12/31/18 1230 12/31/18 1611 12/31/18 1630 12/31/18 1700  BP: (!) 159/70 (!) 163/97 (!) 174/76 (!) 196/85  Pulse: 97 98 96 89  Resp: 18 20 18 17   Temp:      TempSrc:      SpO2: 98% 98% 98% 99%  Weight:      Height:        Intake/Output Summary (Last 24 hours) at 12/31/2018 1813 Last data filed at 12/31/2018 1603 Gross per 24 hour  Intake 3381.42 ml  Output 700 ml  Net 2681.42 ml   Filed Weights   12/29/18 2029  Weight: 60 kg    Examination: General appearance: disheveled adult female lying in bed with covers over head in NAD, pulls them back, opens eyes to look at me, pulls covers back HEENT: MM pink/dry/tacky, anicteric, lids/lashes normal. No nasal deformity, discharge or epistaxis.  Pupils =/reactive Skin: warm/dry, no edema, no rashes on scalp, face, neck, chest, back, arms, trunk, legs Cardiac: Tachyc, regular, no murmurs, no JVD, no LE edema Respiratory: respirations even, easy, no rales or wheezing   Abdomen: soft, nontender, normal BS, old midline scar, no hernias   MSK: no acute joint deformities or effusions normal muscle bulk and tone Neuro: strength good in all four limbs.  Does not follow commands>  Pupils reactive.  Makes some eye conact, does not respond to verbal cues or answer questions other than "leave me alone" or "Go away" PSY: Unable to assess. No obvious internal stimuli    Data Reviewed: I have personally reviewed following labs and imaging studies:  CBC: Recent Labs  Lab 12/29/18 2115 12/30/18 0427 12/30/18 1230 12/31/18 0444  WBC 12.6* 19.4* 20.5* 18.0*  NEUTROABS 11.1*  --   --  13.0*  HGB 11.4* 10.8* 10.3* 9.7*  HCT 35.2* 32.6* 30.6* 29.8*  MCV 73.5* 71.0* 70.3* 72.3*  PLT 362 302 284 [id number]   Basic Metabolic Panel: Recent Labs  Lab 12/30/18 0101 12/30/18  0427 12/30/18 0830 12/30/18 1205 12/31/18 0444  NA 133* 139 138 139 139  K 4.0 3.3* 3.4* 3.5 3.1*  CL 103 107 107 113* 111  CO2 12* 16* 19* 20* 19*  GLUCOSE 607* 248* 187* 137* 186*  BUN 25* 25* 25* 21 17  CREATININE 1.79* 1.79* 1.66* 1.54* 1.27*  CALCIUM 8.0* 9.2 8.7* 8.5* 8.2*  MG  --   --  1.5*  --   --   PHOS  --   --  1.6*  --   --    GFR: Estimated Creatinine Clearance: 39.6 mL/min (A) (by C-G formula based on SCr of 1.27 mg/dL (H)). Liver Function Tests: Recent Labs  Lab 12/29/18 2115 12/31/18 0444  AST 25 81*  ALT 44 45*  ALKPHOS 329* 203*  BILITOT 1.1 0.5  PROT 8.6* 6.4*  ALBUMIN 4.2 3.0*   Recent Labs  Lab 12/29/18 2115  LIPASE 13   No results for input(s): AMMONIA in the last 168 hours. Coagulation Profile:  Recent Labs  Lab 12/29/18 2115  INR 1.4*   Cardiac Enzymes: No results for input(s): CKTOTAL, CKMB, CKMBINDEX, TROPONINI in the last 168 hours. BNP (last 3 results) No results for input(s): PROBNP in the last 8760 hours. HbA1C: No results for input(s): HGBA1C in the last 72 hours. CBG: Recent Labs  Lab 12/31/18 0109 12/31/18 0407 12/31/18 0741 12/31/18 1213 12/31/18 1607  GLUCAP 92 184* 171* 161* 153*   Lipid Profile: No results for input(s): CHOL, HDL, LDLCALC, TRIG, CHOLHDL, LDLDIRECT in the last 72 hours. Thyroid Function Tests: No results for input(s): TSH, T4TOTAL, FREET4, T3FREE, THYROIDAB in the last 72 hours. Anemia Panel: No results for input(s): VITAMINB12, FOLATE, FERRITIN, TIBC, IRON, RETICCTPCT in the last 72 hours. Urine analysis:    Component Value Date/Time   COLORURINE YELLOW (A) 12/29/2018 2115   APPEARANCEUR HAZY (A) 12/29/2018 2115   APPEARANCEUR CLEAR 06/07/2014 1233   LABSPEC 1.020 12/29/2018 2115   LABSPEC 1.010 06/07/2014 1233   PHURINE 5.0 12/29/2018 2115   GLUCOSEU >=500 (A) 12/29/2018 2115   GLUCOSEU 500 mg/dL 06/07/2014 1233   HGBUR SMALL (A) 12/29/2018 2115   BILIRUBINUR NEGATIVE 12/29/2018 2115    BILIRUBINUR NEGATIVE 06/07/2014 1233   KETONESUR 20 (A) 12/29/2018 2115   PROTEINUR 30 (A) 12/29/2018 2115   NITRITE NEGATIVE 12/29/2018 2115   LEUKOCYTESUR NEGATIVE 12/29/2018 2115   LEUKOCYTESUR NEGATIVE 06/07/2014 1233   Sepsis Labs: @LABRCNTIP (procalcitonin:4,lacticacidven:4)  ) Recent Results (from the past 240 hour(s))  Blood Culture (routine x 2)     Status: None (Preliminary result)   Collection Time: 12/29/18  9:15 PM   Specimen: BLOOD  Result Value Ref Range Status   Specimen Description BLOOD BLOOD RIGHT FOREARM  Final   Special Requests   Final    BOTTLES DRAWN AEROBIC AND ANAEROBIC Blood Culture adequate volume   Culture   Final    NO GROWTH 2 DAYS Performed at [facility], [address], [place], [place] [address]    Report Status PENDING  Incomplete  Urine culture     Status: None   Collection Time: 12/29/18  9:15 PM   Specimen: In/Out Cath Urine  Result Value Ref Range Status   Specimen Description   Final    IN/OUT CATH URINE Performed at [facility], [address], [place], [place] [address]    Special Requests   Final    NONE Performed at [facility], [address], [place], [place] [address]    Culture   Final    NO GROWTH Performed at [facility], [place] [address] [place], [place] [address]    Report Status 12/31/2018 FINAL  Final  SARS CORONAVIRUS 2 (TAT 6-24 HRS) Nasopharyngeal Nasopharyngeal Swab     Status: None   Collection Time: 12/29/18  9:15 PM   Specimen: Nasopharyngeal Swab  Result Value Ref Range Status   SARS Coronavirus 2 NEGATIVE NEGATIVE Final    Comment: (NOTE) SARS-CoV-2 target nucleic acids are NOT DETECTED. The SARS-CoV-2 RNA is generally detectable in upper and lower respiratory specimens during the acute phase of infection. Negative results do not preclude SARS-CoV-2 infection, do not rule out co-infections with other pathogens, and should not be used as the sole basis for treatment  or other patient management decisions. Negative results must be combined with clinical observations, patient history, and epidemiological information. The expected result is Negative. Fact Sheet for Patients: SugarRoll.be Fact Sheet for Healthcare Providers: https://www.woods-mathews.com/ This test is not yet approved or cleared  by the [place] and  has been authorized for detection and/or diagnosis of SARS-CoV-2 by FDA under an Emergency Use Authorization (EUA). This EUA will remain  in effect (meaning this test can be used) for the duration of the COVID-19 declaration under Section 56 4(b)(1) of the Act, 21 U.S.C. section 360bbb-3(b)(1), unless the authorization is terminated or revoked sooner. Performed at [facility], [place] [address] [place], [place] [address]   Respiratory Panel by PCR     Status: None   Collection Time: 12/30/18 12:17 AM   Specimen: Nasopharyngeal Swab; Respiratory  Result Value Ref Range Status   Adenovirus NOT DETECTED NOT DETECTED Final   Coronavirus 229E NOT DETECTED NOT DETECTED Final    Comment: (NOTE) The Coronavirus on the Respiratory Panel, DOES NOT test for the novel  Coronavirus (2019 nCoV)    Coronavirus HKU1 NOT DETECTED NOT DETECTED Final   Coronavirus NL63 NOT DETECTED NOT DETECTED Final   Coronavirus OC43 NOT DETECTED NOT DETECTED Final   Metapneumovirus NOT DETECTED NOT DETECTED Final   Rhinovirus / Enterovirus NOT DETECTED NOT DETECTED Final   Influenza A NOT DETECTED NOT DETECTED Final   Influenza B NOT DETECTED NOT DETECTED Final   Parainfluenza Virus 1 NOT DETECTED NOT DETECTED Final   Parainfluenza Virus 2 NOT DETECTED NOT DETECTED Final   Parainfluenza Virus 3 NOT DETECTED NOT DETECTED Final   Parainfluenza Virus 4 NOT DETECTED NOT DETECTED Final   Respiratory Syncytial Virus NOT DETECTED NOT DETECTED Final   Bordetella pertussis NOT DETECTED NOT DETECTED Final    Chlamydophila pneumoniae NOT DETECTED NOT DETECTED Final   Mycoplasma pneumoniae NOT DETECTED NOT DETECTED Final    Comment: Performed at [facility], [place]. [address], [place], [place] [address]  Culture, blood (single) w Reflex to ID Panel     Status: None (Preliminary result)   Collection Time: 12/30/18  7:11 PM   Specimen: BLOOD  Result Value Ref Range Status   Specimen Description BLOOD Blood Culture adequate volume  Final   Special Requests BOTTLES DRAWN AEROBIC ONLY BLOOD LEFT FOREARM  Final   Culture   Final    NO GROWTH < 24 HOURS Performed at [facility], [address] [place], [place] [address]    Report Status PENDING  Incomplete         Radiology Studies: Ct Head Wo Contrast  Result Date: 12/31/2018 CLINICAL DATA:  Seizure EXAM: CT HEAD WITHOUT CONTRAST TECHNIQUE: Contiguous axial images were obtained from the base of the skull through the vertex without intravenous contrast. COMPARISON:  None. FINDINGS: Brain: There is no acute intracranial hemorrhage, mass-effect, or edema. There is no new loss of gray-white differentiation. Confluent areas of hypoattenuation in the supratentorial white matter are nonspecific but likely reflect stable advanced microvascular ischemic changes. Chronic small infarcts of the left cerebellum and thalamus again noted. There is no extra-axial collection. Ventricles are stable in size. Vascular: No hyperdense vessel. Skull: Calvarium is unremarkable. Sinuses/Orbits: Mild paranasal sinus mucosal thickening. Orbits are unremarkable. Other: Mastoid air cells are clear. IMPRESSION: No acute or new findings since December 29, 2018. Stable chronic findings detailed above. Electronically Signed   By: Macy Mis M.D.   On: 12/31/2018 13:15   Ct Head Wo Contrast  Result Date: 12/29/2018 CLINICAL DATA:  Loss of consciousness EXAM: CT HEAD WITHOUT CONTRAST TECHNIQUE: Contiguous axial images were obtained from the base of the skull through  the vertex without intravenous contrast. COMPARISON:  None. FINDINGS:  Brain: No evidence of acute territorial infarction, hemorrhage, hydrocephalus,extra-axial collection or mass lesion/mass effect. The ventricles are normal in size and contour. Low-attenuation changes in the deep white matter consistent with small vessel ischemia. Vascular: No hyperdense vessel or unexpected calcification. Skull: The skull is intact. No fracture or focal lesion identified. Sinuses/Orbits: The visualized paranasal sinuses and mastoid air cells are clear. The orbits and globes intact. Other: None IMPRESSION: No acute intracranial abnormality. Findings consistent with chronic small vessel ischemia Electronically Signed   By: Prudencio Pair M.D.   On: 12/29/2018 23:08   Dg Chest Port 1 View  Result Date: 12/31/2018 CLINICAL DATA:  Sepsis EXAM: PORTABLE CHEST 1 VIEW COMPARISON:  December 29, 2018 which is FINDINGS: The heart size and mediastinal contours are unchanged. Again noted is prominence to the central pulmonary vasculature. There is mildly increased interstitial markings seen at both lower lungs slightly more pronounced than the prior exam. No pleural effusion. No acute osseous abnormality. IMPRESSION: Pulmonary vascular congestion and mildly increased interstitial markings at both lower lungs, slightly more pronounced than the prior exam. This could be due to interstitial edema and/or early infectious etiology. Electronically Signed   By: Prudencio Pair M.D.   On: 12/31/2018 03:38   Dg Chest Port 1 View  Result Date: 12/29/2018 CLINICAL DATA:  Altered mental status EXAM: PORTABLE CHEST 1 VIEW COMPARISON:  November 12, 2016 FINDINGS: The heart size and mediastinal contours are within normal limits. There is prominence to the central pulmonary vasculature. No large airspace consolidation or pleural effusion. IMPRESSION: Findings suggestive pulmonary vascular congestion. Electronically Signed   By: Prudencio Pair M.D.   On:  12/29/2018 21:16        Scheduled Meds: . insulin aspart  0-15 Units Subcutaneous Q4H  . insulin glargine  15 Units Subcutaneous Q24H  . LORazepam  2 mg Intravenous Once   Continuous Infusions: . acyclovir    . ampicillin (OMNIPEN) IV Stopped (12/31/18 1414)  . cefTRIAXone (ROCEPHIN)  IV Stopped (12/31/18 0947)  . lactated ringers 100 mL/hr at 12/31/18 1619  . metronidazole Stopped (12/31/18 1158)  . [START ON 01/01/2019] vancomycin       LOS: 1 day    Time spent: 35 minutes   Noe Gens, AG-ACNP-[facility] 12/31/2018, 6:13 PM          Attending MD Note:  I have seen and examined the patient with NP student and agree with the note above which has been edited to reflect our agreed upon history, exam, and assessment/plan.    I have personally reviewed the orders for the patient, which were made under my direction.        [place]

## 2018-12-31 NOTE — ED Notes (Signed)
Pt very fidgety and constantly removing mittens. This tech has replaced the mittens on pt several times and pt still continues to pull them off. Mittens removed at this time by this tech. Will continue to monitor pt.

## 2018-12-31 NOTE — ED Notes (Signed)
Pt transported to EEG 

## 2018-12-31 NOTE — Consult Note (Addendum)
Pharmacy Antibiotic Note  Audrey Chan is a 68 y.o. female with hx of diabetes, stroke, HTN admitted on 12/29/2018 with sepsis and AMS. Upon evaluation in the ED she was febrile to 104F and tachycardic with HR in the 140s. Initial labs significant for glucose of 965, Scr of 2.0, ALP of 329, mildly prolonged PT, lactate of 2.4, PCT of 0.25, WBC of 12.6 and multiple electrolyte abnormalities. UA un-remarkable for infectious process. CXR impression of pulmonary vascular congestion. CT head un-remarkable. Respiratory viral panel and COVID-19 test negative. Presentation c/f meningitis but LP in ED was un-successful. Based on age, causative organisms of bacterial meningitis include S.pneumoniae, N. meningitidis, L. monocytogenes, and aerobic gram negative bacilli. Pharmacy has been consulted for vancomycin dosing. Patient is also on broad empiric coverage with acyclovir, ampicillin, ceftriaxone, and metronidazole.   WBC trend 12.6 >> 20.5 >> 18.0. Temperature has normalized. Scr has improved from 2.0 on admission to 1.27 today. Plan for IR to attempt LP. Most recent dose of vancomycin 1 g 11/10 @ 0950.   Plan: Based on improved renal function will alter regimen to: Vancomycin 1000 mg IV q24hrs starting 11/11 to target a trough of 15-20 mcg/mL for empiric coverage of bacterial meningitis.  Expected AUC: 621.1, trough 16.6 SCr used: 1.27  Continue to monitor daily Scr per protocol and adjust dosing as indicated.   Height: 5\' 8"  (172.7 cm) Weight: 132 lb 4.4 oz (60 kg) IBW/kg (Calculated) : 63.9  Temp (24hrs), Avg:99.3 F (37.4 C), Min:97.5 F (36.4 C), Max:101.1 F (38.4 C)  Recent Labs  Lab 12/29/18 2115 12/29/18 2357 12/30/18 0101 12/30/18 0214 12/30/18 0427 12/30/18 0830 12/30/18 1205 12/30/18 1230 12/30/18 1911 12/31/18 0444  WBC 12.6*  --   --   --  19.4*  --   --  20.5*  --  18.0*  CREATININE 2.00*  --  1.79*  --  1.79* 1.66* 1.54*  --   --  1.27*  LATICACIDVEN 3.4* 2.4*  --   5.3* 4.9* 2.6*  --   --  2.3*  --     Estimated Creatinine Clearance: 39.6 mL/min (A) (by C-G formula based on SCr of 1.27 mg/dL (H)).    No Known Allergies  Antimicrobials this admission: Cefepime 11/8 x1 Vancomycin 11/8 >>  Metronidazole 11/8 >>  Ceftriaxone 11/9 >> Ampicillin 11/9 >> Acyclovir 11/9 >>   Dose adjustments this admission: 11/10: vancomycin regimen altered from 1 g q36h to 1000 mg q24h based on improved clearance and target trough of 15-20 mcg/mL for bacterial meningitis   Microbiology results: 11/10 BCx: Pending 11/9 BCx: No growth < 12 hours 11/9 HIV antibody: non-reactive 11/8 BCx: No growth x 2 days 11/8 UCx: no growth 11/8 COVID-19: negative 11/8 Viral respiratory panel: negative   Thank you for allowing pharmacy to be a part of this patient's care.  [place] Resident 12/31/2018 9:46 AM

## 2018-12-31 NOTE — ED Notes (Signed)
1:1 sitter Brittney NT as pt found crawling out of bed.

## 2018-12-31 NOTE — ED Notes (Signed)
Pt's sister updated via phone

## 2018-12-31 NOTE — Consult Note (Signed)
Reason for Consult: AMS Referring Physician: Dr. Loleta Books   CC: AMS  HPI: Audrey Chan is an 68 y.o. female with medical history significant of Type 2 diabetes with neuropathy,CVA, hypertension, COPD, hyperlipidemia, who presents reportedly with altered mental status. Pt is obtunded and unable to provide history. No family at bedside. Reportedly patient was noted to be in her normal state of health two days ago but was later found by neighbors in the afternoon with altered mental status and EMS was called. Patient was combative with EMS and had to be chemically restraint with 5mg  of Haldol. She was initially febrile up to 104.1 rectally. She was tachycardic with glc of 965 and gap of 22. Pt admitted for suspicion of DKA. CXR with bilateral infiltarats and is on broad spectrum antibiotics as well as to rule out meningitis. Tmax now 101.1   Past Medical History:  Diagnosis Date  . Allergic rhinitis   . Aneurysm of unspecified site ([place])   . Cholelithiasis   . Diabetes type 2, controlled ([employer])   . GERD (gastroesophageal reflux disease)   . Hypertension   . Neuropathy   . Veno-occlusive disease     Past Surgical History:  Procedure Laterality Date  . ABDOMINAL HYSTERECTOMY  1972  . CERVICAL FUSION    . CHOLECYSTECTOMY N/A 12/28/2014   Procedure: LAPAROSCOPIC CHOLECYSTECTOMY;  Surgeon: Sherri Rad, MD;  Location: [facility] ORS;  Service: General;  Laterality: N/A;  . EYE SURGERY     detached retina  . MASS EXCISION Left 05/06/2018   Procedure: EXCISION OF LEFT ANTECUBITAL CYST, DIABETIC;  Surgeon: Vickie Epley, MD;  Location: [facility] ORS;  Service: Vascular;  Laterality: Left;    Family History  Problem Relation Age of Onset  . Lung cancer Father     Social History:  reports that she quit smoking about 19 years ago. She has never used smokeless tobacco. She reports that she does not drink alcohol or use drugs.  No Known Allergies  Medications: I have reviewed the patient's current  medications.  ROS: Unable to obtain as not following commands   Physical Examination: Blood pressure (!) 166/73, pulse (!) 101, temperature (!) 97.5 F (36.4 C), temperature source Rectal, resp. rate 17, height 5\' 8"  (1.727 m), weight 60 kg, SpO2 98 %.    Neurological Examination   Mental Status: Moans doesn't follow commands  Cranial Nerves: II: Discs flat bilaterally;III,IV, VI: ptosis not present, extra-ocular motions intact bilaterally IX,X: gag reflex present XI: bilateral shoulder shrug XII: midline tongue extension Motor: Patient is contracted and not following commands Increased tone Sensory: withdraws from pain  Deep Tendon Reflexes: 1+ and symmetric throughout Plantars: Right: downgoing   Left: downgoing Cerebellar: Not tested     Laboratory Studies:   Basic Metabolic Panel: Recent Labs  Lab 12/30/18 0101 12/30/18 0427 12/30/18 0830 12/30/18 1205 12/31/18 0444  NA 133* 139 138 139 139  K 4.0 3.3* 3.4* 3.5 3.1*  CL 103 107 107 113* 111  CO2 12* 16* 19* 20* 19*  GLUCOSE 607* 248* 187* 137* 186*  BUN 25* 25* 25* 21 17  CREATININE 1.79* 1.79* 1.66* 1.54* 1.27*  CALCIUM 8.0* 9.2 8.7* 8.5* 8.2*  MG  --   --  1.5*  --   --   PHOS  --   --  1.6*  --   --     Liver Function Tests: Recent Labs  Lab 12/29/18 2115 12/31/18 0444  AST 25 81*  ALT 44 45*  ALKPHOS  329* 203*  BILITOT 1.1 0.5  PROT 8.6* 6.4*  ALBUMIN 4.2 3.0*   Recent Labs  Lab 12/29/18 2115  LIPASE 13   No results for input(s): AMMONIA in the last 168 hours.  CBC: Recent Labs  Lab 12/29/18 2115 12/30/18 0427 12/30/18 1230 12/31/18 0444  WBC 12.6* 19.4* 20.5* 18.0*  NEUTROABS 11.1*  --   --  13.0*  HGB 11.4* 10.8* 10.3* 9.7*  HCT 35.2* 32.6* 30.6* 29.8*  MCV 73.5* 71.0* 70.3* 72.3*  PLT 362 302 284 237    Cardiac Enzymes: No results for input(s): CKTOTAL, CKMB, CKMBINDEX, TROPONINI in the last 168 hours.  BNP: Invalid input(s): POCBNP  CBG: Recent Labs  Lab  12/30/18 2023 12/30/18 2244 12/31/18 0109 12/31/18 0407 12/31/18 0741  GLUCAP 156* 81 92 184* 171*    Microbiology: Results for orders placed or performed during the hospital encounter of 12/29/18  Blood Culture (routine x 2)     Status: None (Preliminary result)   Collection Time: 12/29/18  9:15 PM   Specimen: BLOOD  Result Value Ref Range Status   Specimen Description BLOOD BLOOD RIGHT FOREARM  Final   Special Requests   Final    BOTTLES DRAWN AEROBIC AND ANAEROBIC Blood Culture adequate volume   Culture   Final    NO GROWTH 2 DAYS Performed at [facility], [address], [place], [place] [address]    Report Status PENDING  Incomplete  Urine culture     Status: None   Collection Time: 12/29/18  9:15 PM   Specimen: In/Out Cath Urine  Result Value Ref Range Status   Specimen Description   Final    IN/OUT CATH URINE Performed at [facility], [address] [place], [place] [address]    Special Requests   Final    NONE Performed at [facility], [address], [place], [place] [address]    Culture   Final    NO GROWTH Performed at [facility], [place] [address], [place], [place] [address]    Report Status 12/31/2018 FINAL  Final  SARS CORONAVIRUS 2 (TAT 6-24 HRS) Nasopharyngeal Nasopharyngeal Swab     Status: None   Collection Time: 12/29/18  9:15 PM   Specimen: Nasopharyngeal Swab  Result Value Ref Range Status   SARS Coronavirus 2 NEGATIVE NEGATIVE Final    Comment: (NOTE) SARS-CoV-2 target nucleic acids are NOT DETECTED. The SARS-CoV-2 RNA is generally detectable in upper and lower respiratory specimens during the acute phase of infection. Negative results do not preclude SARS-CoV-2 infection, do not rule out co-infections with other pathogens, and should not be used as the sole basis for treatment or other patient management decisions. Negative results must be combined with clinical observations, patient history, and  epidemiological information. The expected result is Negative. Fact Sheet for Patients: SugarRoll.be Fact Sheet for Healthcare Providers: https://www.woods-mathews.com/ This test is not yet approved or cleared by the [place] FDA and  has been authorized for detection and/or diagnosis of SARS-CoV-2 by FDA under an Emergency Use Authorization (EUA). This EUA will remain  in effect (meaning this test can be used) for the duration of the COVID-19 declaration under Section 56 4(b)(1) of the Act, 21 U.S.C. section 360bbb-3(b)(1), unless the authorization is terminated or revoked sooner. Performed at [facility], [place] [address], [place], [place] [address]   Respiratory Panel by PCR     Status: None   Collection Time: 12/30/18 12:17 AM   Specimen:  Nasopharyngeal Swab; Respiratory  Result Value Ref Range Status   Adenovirus NOT DETECTED NOT DETECTED Final   Coronavirus 229E NOT DETECTED NOT DETECTED Final    Comment: (NOTE) The Coronavirus on the Respiratory Panel, DOES NOT test for the novel  Coronavirus 2017/06/10 nCoV)    Coronavirus HKU1 NOT DETECTED NOT DETECTED Final   Coronavirus NL63 NOT DETECTED NOT DETECTED Final   Coronavirus OC43 NOT DETECTED NOT DETECTED Final   Metapneumovirus NOT DETECTED NOT DETECTED Final   Rhinovirus / Enterovirus NOT DETECTED NOT DETECTED Final   Influenza A NOT DETECTED NOT DETECTED Final   Influenza B NOT DETECTED NOT DETECTED Final   Parainfluenza Virus 1 NOT DETECTED NOT DETECTED Final   Parainfluenza Virus 2 NOT DETECTED NOT DETECTED Final   Parainfluenza Virus 3 NOT DETECTED NOT DETECTED Final   Parainfluenza Virus 4 NOT DETECTED NOT DETECTED Final   Respiratory Syncytial Virus NOT DETECTED NOT DETECTED Final   Bordetella pertussis NOT DETECTED NOT DETECTED Final   Chlamydophila pneumoniae NOT DETECTED NOT DETECTED Final   Mycoplasma pneumoniae NOT DETECTED NOT DETECTED Final    Comment:  Performed at [facility], [place] [address], [place], [place] [address]  Culture, blood (single) w Reflex to ID Panel     Status: None (Preliminary result)   Collection Time: 12/30/18  7:11 PM   Specimen: BLOOD  Result Value Ref Range Status   Specimen Description BLOOD Blood Culture adequate volume  Final   Special Requests BOTTLES DRAWN AEROBIC ONLY BLOOD LEFT FOREARM  Final   Culture   Final    NO GROWTH < 12 HOURS Performed at [facility], [address], [place], [place] [address]    Report Status PENDING  Incomplete    Coagulation Studies: Recent Labs    12/29/18 06-10-13  LABPROT 17.0*  INR 1.4*    Urinalysis:  Recent Labs  Lab 12/29/18 2115  COLORURINE YELLOW*  LABSPEC 1.020  PHURINE 5.0  GLUCOSEU >=500*  HGBUR SMALL*  BILIRUBINUR NEGATIVE  KETONESUR 20*  PROTEINUR 30*  NITRITE NEGATIVE  LEUKOCYTESUR NEGATIVE    Lipid Panel:  No results found for: CHOL, TRIG, HDL, CHOLHDL, VLDL, LDLCALC  HgbA1C: No results found for: HGBA1C  Urine Drug Screen:  No results found for: LABOPIA, COCAINSCRNUR, LABBENZ, AMPHETMU, THCU, LABBARB  Alcohol Level: No results for input(s): ETH in the last 168 hours.  Other results: EKG: normal EKG, normal sinus rhythm, unchanged from previous tracings.  Imaging: Ct Head Wo Contrast  Result Date: 12/29/2018 CLINICAL DATA:  Loss of consciousness EXAM: CT HEAD WITHOUT CONTRAST TECHNIQUE: Contiguous axial images were obtained from the base of the skull through the vertex without intravenous contrast. COMPARISON:  None. FINDINGS: Brain: No evidence of acute territorial infarction, hemorrhage, hydrocephalus,extra-axial collection or mass lesion/mass effect. The ventricles are normal in size and contour. Low-attenuation changes in the deep white matter consistent with small vessel ischemia. Vascular: No hyperdense vessel or unexpected calcification. Skull: The skull is intact. No fracture or focal lesion identified. Sinuses/Orbits:  The visualized paranasal sinuses and mastoid air cells are clear. The orbits and globes intact. Other: None IMPRESSION: No acute intracranial abnormality. Findings consistent with chronic small vessel ischemia Electronically Signed   By: Prudencio Pair M.D.   On: 12/29/2018 23:08   Dg Chest Port 1 View  Result Date: 12/31/2018 CLINICAL DATA:  Sepsis EXAM: PORTABLE CHEST 1 VIEW COMPARISON:  December 29, 2018 which is FINDINGS: The heart size and mediastinal contours are unchanged. Again noted  is prominence to the central pulmonary vasculature. There is mildly increased interstitial markings seen at both lower lungs slightly more pronounced than the prior exam. No pleural effusion. No acute osseous abnormality. IMPRESSION: Pulmonary vascular congestion and mildly increased interstitial markings at both lower lungs, slightly more pronounced than the prior exam. This could be due to interstitial edema and/or early infectious etiology. Electronically Signed   By: Prudencio Pair M.D.   On: 12/31/2018 03:38   Dg Chest Port 1 View  Result Date: 12/29/2018 CLINICAL DATA:  Altered mental status EXAM: PORTABLE CHEST 1 VIEW COMPARISON:  November 12, 2016 FINDINGS: The heart size and mediastinal contours are within normal limits. There is prominence to the central pulmonary vasculature. No large airspace consolidation or pleural effusion. IMPRESSION: Findings suggestive pulmonary vascular congestion. Electronically Signed   By: Prudencio Pair M.D.   On: 12/29/2018 21:16     Assessment/Plan:  68 y.o. female with medical history significant of Type 2 diabetes with neuropathy,CVA, hypertension, COPD, hyperlipidemia, who presents reportedly with altered mental status. Pt is obtunded and unable to provide history. No family at bedside. Reportedly patient was noted to be in her normal state of health two days ago but was later found by neighbors in the afternoon with altered mental status and EMS was called. Patient was  combative with EMS and had to be chemically restraint with 5mg  of Haldol. She was initially febrile up to 104.1 rectally. She was tachycardic with glc of 965 and gap of 22. Pt admitted for suspicion of DKA. CXR with bilateral infiltarats and is on broad spectrum antibiotics as well as to rule out meningitis. Tmax now 101.1  Plan: - Agree this is likely metabolic but given the fevers and significant increased tone can't rule out bacterial meningitis - Would continue broad spectrum antibiotics and acylovir - Non convulsive seizures is a possiblity, EEG ordered  - Agree with LP under IR to look at cytology and cells - I have ordered [facility] repeat as well as she is not getting better. Ultimately if doesn't improve will need MRI with and without contrast. Renal function improving.   - Will continue to follow  12/31/2018, 11:56 AM

## 2018-12-31 NOTE — Progress Notes (Signed)
eLink Physician-Brief Progress Note Patient Name: Audrey Chan DOB: [birth year] MRN: [id number]   Date of Service  12/31/2018  HPI/Events of Note  Pt admitted with fever, bilateral lung infiltrates, high anion gap metabolic acidosis in the context of hyperglycemia (DKA?) and altered mental status.  eICU Interventions  New patient evaluation completed. I agree with need for LP to exclude meningitis, given very high fever and AMS, as well as cEEG to exclude seizures. Interval MRI brain is also planned per neurology note.        Kerry Kass Karinna Beadles 12/31/2018, 8:28 PM

## 2018-12-31 NOTE — ED Notes (Signed)
Pt constantly trying to pull off mittens keeps asking this EDT to leave her alone and is fighting with this EDT

## 2019-01-01 ENCOUNTER — Other Ambulatory Visit: Payer: Medicare PPO

## 2019-01-01 ENCOUNTER — Inpatient Hospital Stay: Payer: Medicare PPO

## 2019-01-01 DIAGNOSIS — E876 Hypokalemia: Secondary | ICD-10-CM

## 2019-01-01 LAB — BLOOD GAS, VENOUS
Acid-base deficit: 7.1 mmol/L — ABNORMAL HIGH (ref 0.0–2.0)
Bicarbonate: 18.7 mmol/L — ABNORMAL LOW (ref 20.0–28.0)
O2 Saturation: 42.4 %
Patient temperature: 37
pCO2, Ven: 38 mmHg — ABNORMAL LOW (ref 44.0–60.0)
pH, Ven: 7.3 (ref 7.250–7.430)

## 2019-01-01 LAB — GLUCOSE, CAPILLARY
Glucose-Capillary: 154 mg/dL — ABNORMAL HIGH (ref 70–99)
Glucose-Capillary: 161 mg/dL — ABNORMAL HIGH (ref 70–99)
Glucose-Capillary: 183 mg/dL — ABNORMAL HIGH (ref 70–99)
Glucose-Capillary: 198 mg/dL — ABNORMAL HIGH (ref 70–99)
Glucose-Capillary: 205 mg/dL — ABNORMAL HIGH (ref 70–99)
Glucose-Capillary: 224 mg/dL — ABNORMAL HIGH (ref 70–99)
Glucose-Capillary: 338 mg/dL — ABNORMAL HIGH (ref 70–99)

## 2019-01-01 LAB — BASIC METABOLIC PANEL
Anion gap: 13 (ref 5–15)
BUN: 10 mg/dL (ref 8–23)
CO2: 17 mmol/L — ABNORMAL LOW (ref 22–32)
Calcium: 8.3 mg/dL — ABNORMAL LOW (ref 8.9–10.3)
Chloride: 111 mmol/L (ref 98–111)
Creatinine, Ser: 1.08 mg/dL — ABNORMAL HIGH (ref 0.44–1.00)
GFR calc Af Amer: >60 mL/min (ref >60)
GFR calc non Af Amer: 52 mL/min — ABNORMAL LOW (ref >60)
Glucose, Bld: 198 mg/dL — ABNORMAL HIGH (ref 70–99)
Potassium: 3 mmol/L — ABNORMAL LOW (ref 3.5–5.1)
Sodium: 141 mmol/L (ref 135–145)

## 2019-01-01 LAB — ACETAMINOPHEN LEVEL: Acetaminophen (Tylenol), Serum: <10 ug/mL — ABNORMAL LOW (ref 10–30)

## 2019-01-01 LAB — MAGNESIUM: Magnesium: 1.2 mg/dL — ABNORMAL LOW (ref 1.7–2.4)

## 2019-01-01 LAB — CBC
HCT: 26.5 % — ABNORMAL LOW (ref 36.0–46.0)
Hemoglobin: 9.2 g/dL — ABNORMAL LOW (ref 12.0–15.0)
MCH: 23.5 pg — ABNORMAL LOW (ref 26.0–34.0)
MCHC: 34.7 g/dL (ref 30.0–36.0)
MCV: 67.8 fL — ABNORMAL LOW (ref 80.0–100.0)
Platelets: 212 10*3/uL (ref 150–400)
RBC: 3.91 MIL/uL (ref 3.87–5.11)
RDW: 12.8 % (ref 11.5–15.5)
WBC: 14.8 10*3/uL — ABNORMAL HIGH (ref 4.0–10.5)
nRBC: 0 % (ref 0.0–0.2)

## 2019-01-01 LAB — PHOSPHORUS: Phosphorus: 2.9 mg/dL (ref 2.5–4.6)

## 2019-01-01 LAB — AMMONIA: Ammonia: 20 umol/L (ref 9–35)

## 2019-01-01 MED ORDER — ACETAMINOPHEN 325 MG PO TABS
650.0000 mg | ORAL_TABLET | Freq: Four times a day (QID) | ORAL | Status: DC | PRN
  Administered 2019-01-01 – 2019-01-03 (×3): 650 mg via ORAL
  Filled 2019-01-01 (×3): qty 2

## 2019-01-01 MED ORDER — FLUTICASONE PROPIONATE 50 MCG/ACT NA SUSP
2.0000 | Freq: Every day | NASAL | Status: DC | PRN
  Filled 2019-01-01: qty 16

## 2019-01-01 MED ORDER — LABETALOL HCL 5 MG/ML IV SOLN
10.0000 mg | Freq: Once | INTRAVENOUS | Status: AC
  Administered 2019-01-01: 10 mg via INTRAVENOUS
  Filled 2019-01-01: qty 4

## 2019-01-01 MED ORDER — POTASSIUM CHLORIDE 2 MEQ/ML IV SOLN
INTRAVENOUS | Status: DC
  Filled 2019-01-01 (×2): qty 1000

## 2019-01-01 MED ORDER — ADULT MULTIVITAMIN W/MINERALS CH
1.0000 | ORAL_TABLET | Freq: Every day | ORAL | Status: DC
  Administered 2019-01-01 – 2019-01-03 (×3): 1 via ORAL
  Filled 2019-01-01 (×3): qty 1

## 2019-01-01 MED ORDER — VITAMIN D3 25 MCG (1000 UNIT) PO TABS
1000.0000 [IU] | ORAL_TABLET | Freq: Every day | ORAL | Status: DC
  Administered 2019-01-01 – 2019-01-03 (×3): 1000 [IU] via ORAL
  Filled 2019-01-01 (×6): qty 1

## 2019-01-01 MED ORDER — POTASSIUM CHLORIDE CRYS ER 20 MEQ PO TBCR
40.0000 meq | EXTENDED_RELEASE_TABLET | ORAL | Status: AC
  Administered 2019-01-01 (×2): 40 meq via ORAL
  Filled 2019-01-01 (×2): qty 2

## 2019-01-01 MED ORDER — FERROUS SULFATE 325 (65 FE) MG PO TABS
325.0000 mg | ORAL_TABLET | Freq: Two times a day (BID) | ORAL | Status: DC
  Administered 2019-01-01 – 2019-01-03 (×4): 325 mg via ORAL
  Filled 2019-01-01 (×4): qty 1

## 2019-01-01 MED ORDER — ROSUVASTATIN CALCIUM 10 MG PO TABS
20.0000 mg | ORAL_TABLET | Freq: Every day | ORAL | Status: DC
  Administered 2019-01-01 – 2019-01-03 (×3): 20 mg via ORAL
  Filled 2019-01-01 (×3): qty 2

## 2019-01-01 MED ORDER — LISINOPRIL 5 MG PO TABS
10.0000 mg | ORAL_TABLET | Freq: Every day | ORAL | Status: DC
  Administered 2019-01-01 – 2019-01-02 (×2): 10 mg via ORAL
  Filled 2019-01-01 (×2): qty 2

## 2019-01-01 MED ORDER — ALBUTEROL SULFATE HFA 108 (90 BASE) MCG/ACT IN AERS
2.0000 | INHALATION_SPRAY | Freq: Four times a day (QID) | RESPIRATORY_TRACT | Status: DC | PRN
  Filled 2019-01-01: qty 6.7

## 2019-01-01 MED ORDER — HYDROCHLOROTHIAZIDE 25 MG PO TABS
25.0000 mg | ORAL_TABLET | Freq: Every day | ORAL | Status: DC
  Administered 2019-01-01 – 2019-01-03 (×3): 25 mg via ORAL
  Filled 2019-01-01 (×3): qty 1

## 2019-01-01 MED ORDER — GADOBUTROL 1 MMOL/ML IV SOLN
5.0000 mL | Freq: Once | INTRAVENOUS | Status: AC | PRN
  Administered 2019-01-01: 5 mL via INTRAVENOUS

## 2019-01-01 MED ORDER — MAGNESIUM SULFATE 4 GM/100ML IV SOLN
4.0000 g | Freq: Once | INTRAVENOUS | Status: AC
  Administered 2019-01-01: 4 g via INTRAVENOUS
  Filled 2019-01-01: qty 100

## 2019-01-01 MED ORDER — ASPIRIN EC 81 MG PO TBEC
81.0000 mg | DELAYED_RELEASE_TABLET | Freq: Every day | ORAL | Status: DC
  Administered 2019-01-01 – 2019-01-03 (×3): 81 mg via ORAL
  Filled 2019-01-01 (×3): qty 1

## 2019-01-01 MED ORDER — SODIUM CHLORIDE 0.9 % IV SOLN
2.0000 g | INTRAVENOUS | Status: DC
  Administered 2019-01-02 – 2019-01-03 (×2): 2 g via INTRAVENOUS
  Filled 2019-01-01: qty 2
  Filled 2019-01-01: qty 20

## 2019-01-01 MED ORDER — UMECLIDINIUM-VILANTEROL 62.5-25 MCG/INH IN AEPB
1.0000 | INHALATION_SPRAY | Freq: Every day | RESPIRATORY_TRACT | Status: DC
  Administered 2019-01-01 – 2019-01-03 (×3): 1 via RESPIRATORY_TRACT
  Filled 2019-01-01: qty 14

## 2019-01-01 MED ORDER — CLOPIDOGREL BISULFATE 75 MG PO TABS
75.0000 mg | ORAL_TABLET | Freq: Every day | ORAL | Status: DC
  Administered 2019-01-01 – 2019-01-03 (×3): 75 mg via ORAL
  Filled 2019-01-01 (×3): qty 1

## 2019-01-01 NOTE — Progress Notes (Signed)
Inpatient Diabetes Program Recommendations  AACE/ADA: New Consensus Statement on Inpatient Glycemic Control (2015)  Target Ranges:  Prepandial:   less than 140 mg/dL      Peak postprandial:   less than 180 mg/dL (1-2 hours)      Critically ill patients:  140 - 180 mg/dL   Lab Results  Component Value Date   GLUCAP 224 (H) 01/01/2019    Review of Glycemic Control Results for MALAYJA, NICOSIA (MRN [id number]) as of 01/01/2019 09:13  Ref. Range 12/31/2018 07:41 12/31/2018 12:13 12/31/2018 16:07 12/31/2018 20:29 12/31/2018 23:56 01/01/2019 04:06 01/01/2019 07:32  Glucose-Capillary Latest Ref Range: 70 - 99 mg/dL 171 (H) 161 (H) 153 (H) 81 198 (H) 183 (H) 224 (H)   Diabetes history: DM 2 Outpatient Diabetes medications: Lantus 16 units qhs, Apidra 1-50 units, Metformin 1000 mg Daily Current orders for Inpatient glycemic control:  Lantus 15 units Novolog 0-15 units Q4 hours  Inpatient Diabetes Program Recommendations:    Glucose 224 this am. Lantus was not given last night. Will watch trends for now.  Thanks,  Tama Headings RN, MSN, BC-ADM Inpatient Diabetes Coordinator Team Pager [phone] (8a-5p)

## 2019-01-01 NOTE — Progress Notes (Addendum)
Subjective: Much more awake today and following commands. Did not get EEG due to agitation and failed LP yesterday.  Tone normal this AM and following commands.    Past Medical History:  Diagnosis Date  . Allergic rhinitis   . Aneurysm of unspecified site ([place])   . Cholelithiasis   . Diabetes type 2, controlled ([place])   . GERD (gastroesophageal reflux disease)   . Hypertension   . Neuropathy   . Veno-occlusive disease     Past Surgical History:  Procedure Laterality Date  . ABDOMINAL HYSTERECTOMY  1972  . CERVICAL FUSION    . CHOLECYSTECTOMY N/A 12/28/2014   Procedure: LAPAROSCOPIC CHOLECYSTECTOMY;  Surgeon: Sherri Rad, MD;  Location: [facility] ORS;  Service: General;  Laterality: N/A;  . EYE SURGERY     detached retina  . MASS EXCISION Left 05/06/2018   Procedure: EXCISION OF LEFT ANTECUBITAL CYST, DIABETIC;  Surgeon: Vickie Epley, MD;  Location: [facility] ORS;  Service: Vascular;  Laterality: Left;    Family History  Problem Relation Age of Onset  . Lung cancer Father     Social History:  reports that she quit smoking about 19 years ago. She has never used smokeless tobacco. She reports that she does not drink alcohol or use drugs.  No Known Allergies  Medications: I have reviewed the patient's current medications.   Physical Examination: Blood pressure (!) 145/66, pulse 99, temperature 99.4 F (37.4 C), temperature source Oral, resp. rate 17, height 5\' 7"  (1.702 m), weight 59.1 kg, SpO2 98 %.     Neurological Examination   Mental Status: Alert, oriented to name and location  Cranial Nerves: II: Discs flat bilaterally; Visual fields grossly normal, pupils equal, round, reactive to light and accommodation III,IV, VI: ptosis not present, extra-ocular motions intact bilaterally V,VII: smile symmetric, facial light touch sensation normal bilaterally VIII: hearing normal bilaterally IX,X: gag reflex present XI: bilateral shoulder shrug XII: midline tongue  extension Motor: Generalized weakness bilaterally  Tone and bulk:normal tone throughout; no atrophy noted Sensory: Pinprick and light touch intact throughout, bilaterally Deep Tendon Reflexes: 1+ and symmetric throughout Plantars: Right: downgoing   Left: downgoing Cerebellar: Not tested.       Laboratory Studies:   Basic Metabolic Panel: Recent Labs  Lab 12/30/18 0427 12/30/18 0830 12/30/18 1205 12/31/18 0444 01/01/19 0452 01/01/19 0457  NA 139 138 139 139 141  --   K 3.3* 3.4* 3.5 3.1* 3.0*  --   CL 107 107 113* 111 111  --   CO2 16* 19* 20* 19* 17*  --   GLUCOSE 248* 187* 137* 186* 198*  --   BUN 25* 25* 21 17 10   --   CREATININE 1.79* 1.66* 1.54* 1.27* 1.08*  --   CALCIUM 9.2 8.7* 8.5* 8.2* 8.3*  --   MG  --  1.5*  --   --   --  1.2*  PHOS  --  1.6*  --   --   --  2.9    Liver Function Tests: Recent Labs  Lab 12/29/18 2115 12/31/18 0444  AST 25 81*  ALT 44 45*  ALKPHOS 329* 203*  BILITOT 1.1 0.5  PROT 8.6* 6.4*  ALBUMIN 4.2 3.0*   Recent Labs  Lab 12/29/18 2115  LIPASE 13   Recent Labs  Lab 01/01/19 0457  AMMONIA 20    CBC: Recent Labs  Lab 12/29/18 2115 12/30/18 0427 12/30/18 1230 12/31/18 0444 01/01/19 0452  WBC 12.6* 19.4* 20.5* 18.0* 14.8*  NEUTROABS 11.1*  --   --  13.0*  --   HGB 11.4* 10.8* 10.3* 9.7* 9.2*  HCT 35.2* 32.6* 30.6* 29.8* 26.5*  MCV 73.5* 71.0* 70.3* 72.3* 67.8*  PLT 362 302 284 237 212    Cardiac Enzymes: No results for input(s): CKTOTAL, CKMB, CKMBINDEX, TROPONINI in the last 168 hours.  BNP: Invalid input(s): POCBNP  CBG: Recent Labs  Lab 12/31/18 1607 12/31/18 2029 12/31/18 2356 01/01/19 0406 01/01/19 0732  GLUCAP 153* 81 198* 183* 224*    Microbiology: Results for orders placed or performed during the hospital encounter of 12/29/18  Blood Culture (routine x 2)     Status: None (Preliminary result)   Collection Time: 12/29/18  9:15 PM   Specimen: BLOOD  Result Value Ref Range Status    Specimen Description BLOOD BLOOD RIGHT FOREARM  Final   Special Requests   Final    BOTTLES DRAWN AEROBIC AND ANAEROBIC Blood Culture adequate volume   Culture   Final    NO GROWTH 2 DAYS Performed at [facility], [address] [place], [place] [address]    Report Status PENDING  Incomplete  Urine culture     Status: None   Collection Time: 12/29/18  9:15 PM   Specimen: In/Out Cath Urine  Result Value Ref Range Status   Specimen Description   Final    IN/OUT CATH URINE Performed at [facility], [address], [place], [place] [address]    Special Requests   Final    NONE Performed at [facility], [address] [place], [place] [address]    Culture   Final    NO GROWTH Performed at [facility], [place] [address], [place], [place] [address]    Report Status 12/31/2018 FINAL  Final  SARS CORONAVIRUS 2 (TAT 6-24 HRS) Nasopharyngeal Nasopharyngeal Swab     Status: None   Collection Time: 12/29/18  9:15 PM   Specimen: Nasopharyngeal Swab  Result Value Ref Range Status   SARS Coronavirus 2 NEGATIVE NEGATIVE Final    Comment: (NOTE) SARS-CoV-2 target nucleic acids are NOT DETECTED. The SARS-CoV-2 RNA is generally detectable in upper and lower respiratory specimens during the acute phase of infection. Negative results do not preclude SARS-CoV-2 infection, do not rule out co-infections with other pathogens, and should not be used as the sole basis for treatment or other patient management decisions. Negative results must be combined with clinical observations, patient history, and epidemiological information. The expected result is Negative. Fact Sheet for Patients: SugarRoll.be Fact Sheet for Healthcare Providers: https://www.woods-mathews.com/ This test is not yet approved or cleared by the [place] FDA and  has been authorized for detection and/or diagnosis of SARS-CoV-2 by FDA under an  Emergency Use Authorization (EUA). This EUA will remain  in effect (meaning this test can be used) for the duration of the COVID-19 declaration under Section 56 4(b)(1) of the Act, 21 U.S.C. section 360bbb-3(b)(1), unless the authorization is terminated or revoked sooner. Performed at [facility], [place] [address], [place],  [address]   Respiratory Panel by PCR     Status: None   Collection Time: 12/30/18 12:17 AM   Specimen: Nasopharyngeal Swab; Respiratory  Result Value Ref Range Status   Adenovirus NOT DETECTED NOT DETECTED Final   Coronavirus 229E NOT DETECTED NOT DETECTED Final    Comment: (NOTE) The Coronavirus on the Respiratory Panel, DOES NOT test for the novel  Coronavirus (2019 nCoV)    Coronavirus HKU1 NOT DETECTED NOT DETECTED Final  Coronavirus NL63 NOT DETECTED NOT DETECTED Final   Coronavirus OC43 NOT DETECTED NOT DETECTED Final   Metapneumovirus NOT DETECTED NOT DETECTED Final   Rhinovirus / Enterovirus NOT DETECTED NOT DETECTED Final   Influenza A NOT DETECTED NOT DETECTED Final   Influenza B NOT DETECTED NOT DETECTED Final   Parainfluenza Virus 1 NOT DETECTED NOT DETECTED Final   Parainfluenza Virus 2 NOT DETECTED NOT DETECTED Final   Parainfluenza Virus 3 NOT DETECTED NOT DETECTED Final   Parainfluenza Virus 4 NOT DETECTED NOT DETECTED Final   Respiratory Syncytial Virus NOT DETECTED NOT DETECTED Final   Bordetella pertussis NOT DETECTED NOT DETECTED Final   Chlamydophila pneumoniae NOT DETECTED NOT DETECTED Final   Mycoplasma pneumoniae NOT DETECTED NOT DETECTED Final    Comment: Performed at [facility], [place] [address], [place], [place] [address]  Culture, blood (single) w Reflex to ID Panel     Status: None (Preliminary result)   Collection Time: 12/30/18  7:11 PM   Specimen: BLOOD  Result Value Ref Range Status   Specimen Description BLOOD Blood Culture adequate volume  Final   Special Requests BOTTLES DRAWN AEROBIC ONLY BLOOD LEFT  FOREARM  Final   Culture   Final    NO GROWTH < 24 HOURS Performed at [facility], [address], [place], [place] [address]    Report Status PENDING  Incomplete  MRSA PCR Screening     Status: None   Collection Time: 12/31/18  8:43 PM   Specimen: Nasopharyngeal  Result Value Ref Range Status   MRSA by PCR NEGATIVE NEGATIVE Final    Comment:        The GeneXpert MRSA Assay (FDA approved for NASAL specimens only), is one component of a comprehensive MRSA colonization surveillance program. It is not intended to diagnose MRSA infection nor to guide or monitor treatment for MRSA infections. Performed at [facility], [address] [place], [place] [address]     Coagulation Studies: Recent Labs    12/29/18 06-11-2113  LABPROT 17.0*  INR 1.4*    Urinalysis:  Recent Labs  Lab 12/29/18 2115  COLORURINE YELLOW*  LABSPEC 1.020  PHURINE 5.0  GLUCOSEU >=500*  HGBUR SMALL*  BILIRUBINUR NEGATIVE  KETONESUR 20*  PROTEINUR 30*  NITRITE NEGATIVE  LEUKOCYTESUR NEGATIVE    Lipid Panel:  No results found for: CHOL, TRIG, HDL, CHOLHDL, VLDL, LDLCALC  HgbA1C: No results found for: HGBA1C  Urine Drug Screen:  No results found for: LABOPIA, COCAINSCRNUR, LABBENZ, AMPHETMU, THCU, LABBARB  Alcohol Level: No results for input(s): ETH in the last 168 hours.  Other results: EKG: normal EKG, normal sinus rhythm, unchanged from previous tracings.  Imaging: Ct Head Wo Contrast  Result Date: 12/31/2018 CLINICAL DATA:  Seizure EXAM: CT HEAD WITHOUT CONTRAST TECHNIQUE: Contiguous axial images were obtained from the base of the skull through the vertex without intravenous contrast. COMPARISON:  None. FINDINGS: Brain: There is no acute intracranial hemorrhage, mass-effect, or edema. There is no new loss of gray-white differentiation. Confluent areas of hypoattenuation in the supratentorial white matter are nonspecific but likely reflect stable advanced microvascular  ischemic changes. Chronic small infarcts of the left cerebellum and thalamus again noted. There is no extra-axial collection. Ventricles are stable in size. Vascular: No hyperdense vessel. Skull: Calvarium is unremarkable. Sinuses/Orbits: Mild paranasal sinus mucosal thickening. Orbits are unremarkable. Other: Mastoid air cells are clear. IMPRESSION: No acute or new findings since December 29, 2018. Stable chronic findings detailed above. Electronically Signed  By: Macy Mis M.D.   On: 12/31/2018 13:15   Dg Chest Port 1 View  Result Date: 12/31/2018 CLINICAL DATA:  Sepsis EXAM: PORTABLE CHEST 1 VIEW COMPARISON:  December 29, 2018 which is FINDINGS: The heart size and mediastinal contours are unchanged. Again noted is prominence to the central pulmonary vasculature. There is mildly increased interstitial markings seen at both lower lungs slightly more pronounced than the prior exam. No pleural effusion. No acute osseous abnormality. IMPRESSION: Pulmonary vascular congestion and mildly increased interstitial markings at both lower lungs, slightly more pronounced than the prior exam. This could be due to interstitial edema and/or early infectious etiology. Electronically Signed   By: Prudencio Pair M.D.   On: 12/31/2018 03:38     Assessment/Plan:  68 y.o. female with medical history significant of Type 2 diabetes with neuropathy,CVA, hypertension, COPD, hyperlipidemia, who presents reportedly with altered mental status. Pt is obtunded and unable to provide history. No family at bedside. Reportedly patient was noted to be in her normal state of health two days ago but was later found by neighbors in the afternoon with altered mental status and EMS was called. Patient was combative with EMS and had to be chemically restraint with 5mg  of Haldol. She was initially febrile up to 104.1 rectally. She was tachycardic with glc of 965 and gap of 22. Pt admitted for suspicion of DKA. CXR with bilateral infiltarats  and is on broad spectrum antibiotics as well as to rule out meningitis.   - Fevers improved. Tmax 99.4 - Clearly improved mental status and normal tone unlike yesterday. Able to have a full conversation - no EEG as was agitated yesterday. Don't think she needs it now - repeat CTH no acute abnormalities - will order MRI brain with and without contrast to look for temporal lobe changes to make sure not  - Possibly routine EEG to look for assymetry and temporal lobes.   Addendum MRI reviewed small incidental L medial cerebellar stroke that likely did not contribute to current presentation.  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

## 2019-01-01 NOTE — Progress Notes (Addendum)
PROGRESS NOTE                                                                                                                                                                                                             Patient Demographics:    Audrey Chan, is a 68 y.o. female, DOB - 29-Nov-1950, [id number]  Admit date - 12/29/2018   Admitting Physician Edwin Dada, MD  Outpatient Primary MD for the patient is Revelo, Elyse Jarvis, MD  LOS - 2  Outpatient Specialists: None  Chief Complaint  Patient presents with  . Altered Mental Status       Brief Narrative  68 year old female with history of diabetes mellitus, stroke and hypertension presented with acute change in mental status.  Her boyfriend found her confused on the morning of admission, later during the day was found in bed covered in feces.  When EMS arrived she was found to be combative and febrile to 104 F and tachycardic to 140s.  She was found to be in DKA with anion gap of 22, lactic acid of 3.4 and glucose of 965.  WBC of 12.6K.  Chest x-ray showing bilateral infiltrates.  Head CT unremarkable. Placed on IV fluids, insulin drip.  LP attempted but failed.  Started on empiric IV antibiotics and admitted to stepdown unit.  Neurology consulted.   Subjective:   Patient seems to be very well oriented today.  Denies any headache or dizziness.   Assessment  & Plan :    Active Problems:   DKA (diabetic ketoacidoses) ([place]) Resolved with aggressive IV hydration and insulin drip.  CBG currently stable on Lantus and sliding scale correction.  Hold home Metformin and gabapentin.  Acute metabolic encephalopathy Suspect secondary to DKA and sepsis of unclear etiology.  Did not get EEG due to agitation.  Also failed LP on admission.  Neurology consult appreciated.  Mental status has markedly improved and at baseline.  Recommends he does not need further  EEG.  Repeat head CT was unremarkable.  Ordered MRI brain with and without contrast to look for any temporal lobe changes.  Given her significant improvement in mental status and no longer septic will not need further LP.      Sepsis with acute renal failure without septic shock (HCC) Unclear etiology.  Was being treated with empiric antibiotic and  acyclovir for suspected meningitis.  Mental status has now significantly improved.  Will follow with MRI results.  Holding off on LP given her significant mental status improvement.    AKI (acute kidney injury) ([place]) Likely prerenal with severe dehydration and sepsis.  Resolved with IV hydration.  Holding ACE inhibitor.  Lactic acidosis Secondary to sepsis.  Now resolved.  Holding Metformin  COPD Resume home inhalers  Anemia Continue iron supplement.  Monitor closely.  Essential hypertension/history of stroke Recent stroke 3 months back at [facility].  No residual deficit.  Resume her home blood pressure meds (lisinopril/HCTZ).  Resume aspirin, Plavix and Crestor.  Hypokalemia hypomagnesemia Replenished   Code Status : Full code  Family Communication  : None at bedside  Disposition Plan  : Transfer to [facility].  Barriers For Discharge : Active symptoms  Consults  : Neurology  Procedures  : CT head, MRI brain  DVT Prophylaxis  :  Lovenox -  Lab Results  Component Value Date   PLT 212 01/01/2019    Antibiotics  :    Anti-infectives (From admission, onward)   Start     Dose/Rate Route Frequency Ordered Stop   01/01/19 0950  vancomycin (VANCOCIN) IVPB 750 mg/150 ml premix  Status:  Discontinued     750 mg 150 mL/hr over 60 Minutes Intravenous Every 24 hours 12/31/18 1034 12/31/18 1104   01/01/19 0950  vancomycin (VANCOCIN) IVPB 1000 mg/200 mL premix     1,000 mg 200 mL/hr over 60 Minutes Intravenous Every 24 hours 12/31/18 1104     12/31/18 2000  acyclovir (ZOVIRAX) 600 mg in dextrose 5 % 100 mL IVPB     10 mg/kg  60 kg 112  mL/hr over 60 Minutes Intravenous Every 12 hours 12/31/18 1109     12/31/18 1200  ampicillin (OMNIPEN) 2 g in sodium chloride 0.9 % 100 mL IVPB     2 g 300 mL/hr over 20 Minutes Intravenous Every 6 hours 12/31/18 1109     12/31/18 1000  vancomycin (VANCOCIN) IVPB 1000 mg/200 mL premix  Status:  Discontinued     1,000 mg 200 mL/hr over 60 Minutes Intravenous Every 36 hours 12/30/18 0551 12/31/18 1034   12/30/18 2200  ceFEPIme (MAXIPIME) 2 g in sodium chloride 0.9 % 100 mL IVPB  Status:  Discontinued     2 g 200 mL/hr over 30 Minutes Intravenous Every 24 hours 12/30/18 0552 12/30/18 1833   12/30/18 2200  cefTRIAXone (ROCEPHIN) 2 g in sodium chloride 0.9 % 100 mL IVPB     2 g 200 mL/hr over 30 Minutes Intravenous Every 12 hours 12/30/18 1833     12/30/18 1900  acyclovir (ZOVIRAX) 600 mg in dextrose 5 % 100 mL IVPB  Status:  Discontinued     10 mg/kg  60 kg 112 mL/hr over 60 Minutes Intravenous Every 8 hours 12/30/18 1730 12/31/18 1109   12/30/18 1800  ampicillin (OMNIPEN) 1 g in sodium chloride 0.9 % 100 mL IVPB  Status:  Discontinued     1 g 300 mL/hr over 20 Minutes Intravenous Every 6 hours 12/30/18 1730 12/31/18 1109   12/30/18 0600  metroNIDAZOLE (FLAGYL) IVPB 500 mg     500 mg 100 mL/hr over 60 Minutes Intravenous Every 8 hours 12/30/18 0037     12/29/18 2100  vancomycin (VANCOCIN) 500 mg in sodium chloride 0.9 % 100 mL IVPB     500 mg 100 mL/hr over 60 Minutes Intravenous  Once 12/29/18 2046 12/30/18 0249   12/29/18  2045  ceFEPIme (MAXIPIME) 2 g in sodium chloride 0.9 % 100 mL IVPB     2 g 200 mL/hr over 30 Minutes Intravenous  Once 12/29/18 2031 12/29/18 2213   12/29/18 2045  metroNIDAZOLE (FLAGYL) IVPB 500 mg     500 mg 100 mL/hr over 60 Minutes Intravenous  Once 12/29/18 2031 12/29/18 2238   12/29/18 2045  vancomycin (VANCOCIN) IVPB 1000 mg/200 mL premix     1,000 mg 200 mL/hr over 60 Minutes Intravenous  Once 12/29/18 2031 12/29/18 2332        Objective:   Vitals:    01/01/19 1000 01/01/19 1100 01/01/19 1200 01/01/19 1300  BP:  (!) 152/65    Pulse: 99 100 (!) 102 (!) 105  Resp: 17 18 (!) 21 17  Temp:      TempSrc:      SpO2: 98% 99% 98% 98%  Weight:      Height:        Wt Readings from Last 3 Encounters:  12/31/18 59.1 kg  10/07/18 60.3 kg  09/15/18 63.5 kg     Intake/Output Summary (Last 24 hours) at 01/01/2019 1314 Last data filed at 01/01/2019 0840 Gross per 24 hour  Intake 3527.31 ml  Output 2000 ml  Net 1527.31 ml     Physical Exam  Gen: not in distress HEENT: , moist mucosa, supple neck Chest: clear b/l, no added sounds CVS: N S1&S2, no murmurs, rubs or gallop GI: soft, NT, ND, BS+ Musculoskeletal: warm, no edema CNS: Alert and oriented, nonfocal,    Data Review:    CBC Recent Labs  Lab 12/29/18 2115 12/30/18 0427 12/30/18 1230 12/31/18 0444 01/01/19 0452  WBC 12.6* 19.4* 20.5* 18.0* 14.8*  HGB 11.4* 10.8* 10.3* 9.7* 9.2*  HCT 35.2* 32.6* 30.6* 29.8* 26.5*  PLT 362 302 284 237 212  MCV 73.5* 71.0* 70.3* 72.3* 67.8*  MCH 23.8* 23.5* 23.7* 23.5* 23.5*  MCHC 32.4 33.1 33.7 32.6 34.7  RDW 13.4 12.7 12.9 13.1 12.8  LYMPHSABS 1.0  --   --  2.8  --   MONOABS 0.3  --   --  1.5*  --   EOSABS 0.0  --   --  0.4  --   BASOSABS 0.1  --   --  0.2*  --     Chemistries  Recent Labs  Lab 12/29/18 2115  12/30/18 0427 12/30/18 0830 12/30/18 1205 12/31/18 0444 01/01/19 0452 01/01/19 0457  NA 123*   < > 139 138 139 139 141  --   K 5.6*   < > 3.3* 3.4* 3.5 3.1* 3.0*  --   CL 85*   < > 107 107 113* 111 111  --   CO2 16*   < > 16* 19* 20* 19* 17*  --   GLUCOSE 965*   < > 248* 187* 137* 186* 198*  --   BUN 27*   < > 25* 25* 21 17 10   --   CREATININE 2.00*   < > 1.79* 1.66* 1.54* 1.27* 1.08*  --   CALCIUM 9.6   < > 9.2 8.7* 8.5* 8.2* 8.3*  --   MG  --   --   --  1.5*  --   --   --  1.2*  AST 25  --   --   --   --  81*  --   --   ALT 44  --   --   --   --  45*  --   --  ALKPHOS 329*  --   --   --   --  203*  --   --    BILITOT 1.1  --   --   --   --  0.5  --   --    < > = values in this interval not displayed.   ------------------------------------------------------------------------------------------------------------------ No results for input(s): CHOL, HDL, LDLCALC, TRIG, CHOLHDL, LDLDIRECT in the last 72 hours.  No results found for: HGBA1C ------------------------------------------------------------------------------------------------------------------ No results for input(s): TSH, T4TOTAL, T3FREE, THYROIDAB in the last 72 hours.  Invalid input(s): FREET3 ------------------------------------------------------------------------------------------------------------------ No results for input(s): VITAMINB12, FOLATE, FERRITIN, TIBC, IRON, RETICCTPCT in the last 72 hours.  Coagulation profile Recent Labs  Lab 12/29/18 2115  INR 1.4*    No results for input(s): DDIMER in the last 72 hours.  Cardiac Enzymes No results for input(s): CKMB, TROPONINI, MYOGLOBIN in the last 168 hours.  Invalid input(s): CK ------------------------------------------------------------------------------------------------------------------ No results found for: BNP  Inpatient Medications  Scheduled Meds: . Chlorhexidine Gluconate Cloth  6 each Topical Daily  . insulin aspart  0-15 Units Subcutaneous Q4H  . insulin glargine  15 Units Subcutaneous Q24H  . LORazepam  2 mg Intravenous Once   Continuous Infusions: . sodium chloride Stopped (01/01/19 0046)  . acyclovir 600 mg (01/01/19 0909)  . ampicillin (OMNIPEN) IV Stopped (01/01/19 0519)  . cefTRIAXone (ROCEPHIN)  IV 2 g (01/01/19 1016)  . lactated ringers 1,000 mL with potassium chloride 40 mEq infusion    . magnesium sulfate bolus IVPB    . metronidazole 500 mg (01/01/19 1215)  . vancomycin 1,000 mg (01/01/19 1053)   PRN Meds:.sodium chloride, acetaminophen, hydrALAZINE, ipratropium-albuterol, labetalol  Micro Results Recent Results (from the past 240  hour(s))  Blood Culture (routine x 2)     Status: None (Preliminary result)   Collection Time: 12/29/18  9:15 PM   Specimen: BLOOD  Result Value Ref Range Status   Specimen Description BLOOD BLOOD RIGHT FOREARM  Final   Special Requests   Final    BOTTLES DRAWN AEROBIC AND ANAEROBIC Blood Culture adequate volume   Culture   Final    NO GROWTH 3 DAYS Performed at [facility], [address] [place], [place] [address]    Report Status PENDING  Incomplete  Urine culture     Status: None   Collection Time: 12/29/18  9:15 PM   Specimen: In/Out Cath Urine  Result Value Ref Range Status   Specimen Description   Final    IN/OUT CATH URINE Performed at [facility], [address] [place], [place] [address]    Special Requests   Final    NONE Performed at [facility], [address], [place], [place] [address]    Culture   Final    NO GROWTH Performed at [facility], [place] [address], [place], [place] [address]    Report Status 12/31/2018 FINAL  Final  SARS CORONAVIRUS 2 (TAT 6-24 HRS) Nasopharyngeal Nasopharyngeal Swab     Status: None   Collection Time: 12/29/18  9:15 PM   Specimen: Nasopharyngeal Swab  Result Value Ref Range Status   SARS Coronavirus 2 NEGATIVE NEGATIVE Final    Comment: (NOTE) SARS-CoV-2 target nucleic acids are NOT DETECTED. The SARS-CoV-2 RNA is generally detectable in upper and lower respiratory specimens during the acute phase of infection. Negative results do not preclude SARS-CoV-2 infection, do not rule out co-infections with other pathogens, and should not be used as the sole basis for treatment  or other patient management decisions. Negative results must be combined with clinical observations, patient history, and epidemiological information. The expected result is Negative. Fact Sheet for Patients: SugarRoll.be Fact Sheet for Healthcare Providers:  https://www.woods-mathews.com/ This test is not yet approved or cleared by the [place] FDA and  has been authorized for detection and/or diagnosis of SARS-CoV-2 by FDA under an Emergency Use Authorization (EUA). This EUA will remain  in effect (meaning this test can be used) for the duration of the COVID-19 declaration under Section 56 4(b)(1) of the Act, 21 U.S.C. section 360bbb-3(b)(1), unless the authorization is terminated or revoked sooner. Performed at [facility], [place] [address] [place], [place] [address]   Respiratory Panel by PCR     Status: None   Collection Time: 12/30/18 12:17 AM   Specimen: Nasopharyngeal Swab; Respiratory  Result Value Ref Range Status   Adenovirus NOT DETECTED NOT DETECTED Final   Coronavirus 229E NOT DETECTED NOT DETECTED Final    Comment: (NOTE) The Coronavirus on the Respiratory Panel, DOES NOT test for the novel  Coronavirus (2019 nCoV)    Coronavirus HKU1 NOT DETECTED NOT DETECTED Final   Coronavirus NL63 NOT DETECTED NOT DETECTED Final   Coronavirus OC43 NOT DETECTED NOT DETECTED Final   Metapneumovirus NOT DETECTED NOT DETECTED Final   Rhinovirus / Enterovirus NOT DETECTED NOT DETECTED Final   Influenza A NOT DETECTED NOT DETECTED Final   Influenza B NOT DETECTED NOT DETECTED Final   Parainfluenza Virus 1 NOT DETECTED NOT DETECTED Final   Parainfluenza Virus 2 NOT DETECTED NOT DETECTED Final   Parainfluenza Virus 3 NOT DETECTED NOT DETECTED Final   Parainfluenza Virus 4 NOT DETECTED NOT DETECTED Final   Respiratory Syncytial Virus NOT DETECTED NOT DETECTED Final   Bordetella pertussis NOT DETECTED NOT DETECTED Final   Chlamydophila pneumoniae NOT DETECTED NOT DETECTED Final   Mycoplasma pneumoniae NOT DETECTED NOT DETECTED Final    Comment: Performed at [facility], [place]. [address] [place], [place] [address]  Culture, blood (single) w Reflex to ID Panel     Status: None (Preliminary result)   Collection  Time: 12/30/18  7:11 PM   Specimen: BLOOD  Result Value Ref Range Status   Specimen Description BLOOD Blood Culture adequate volume  Final   Special Requests BOTTLES DRAWN AEROBIC ONLY BLOOD LEFT FOREARM  Final   Culture   Final    NO GROWTH 2 DAYS Performed at [facility], [address] [place], [place] [address]    Report Status PENDING  Incomplete  MRSA PCR Screening     Status: None   Collection Time: 12/31/18  8:43 PM   Specimen: Nasopharyngeal  Result Value Ref Range Status   MRSA by PCR NEGATIVE NEGATIVE Final    Comment:        The GeneXpert MRSA Assay (FDA approved for NASAL specimens only), is one component of a comprehensive MRSA colonization surveillance program. It is not intended to diagnose MRSA infection nor to guide or monitor treatment for MRSA infections. Performed at [facility], [address] [place], [place] [address]     Radiology Reports Ct Head Wo Contrast  Result Date: 12/31/2018 CLINICAL DATA:  Seizure EXAM: CT HEAD WITHOUT CONTRAST TECHNIQUE: Contiguous axial images were obtained from the base of the skull through the vertex without intravenous contrast. COMPARISON:  None. FINDINGS: Brain: There is no acute intracranial hemorrhage, mass-effect, or edema. There is no new loss of gray-white differentiation. Confluent areas  of hypoattenuation in the supratentorial white matter are nonspecific but likely reflect stable advanced microvascular ischemic changes. Chronic small infarcts of the left cerebellum and thalamus again noted. There is no extra-axial collection. Ventricles are stable in size. Vascular: No hyperdense vessel. Skull: Calvarium is unremarkable. Sinuses/Orbits: Mild paranasal sinus mucosal thickening. Orbits are unremarkable. Other: Mastoid air cells are clear. IMPRESSION: No acute or new findings since December 29, 2018. Stable chronic findings detailed above. Electronically Signed   By: Macy Mis M.D.   On:  12/31/2018 13:15   Ct Head Wo Contrast  Result Date: 12/29/2018 CLINICAL DATA:  Loss of consciousness EXAM: CT HEAD WITHOUT CONTRAST TECHNIQUE: Contiguous axial images were obtained from the base of the skull through the vertex without intravenous contrast. COMPARISON:  None. FINDINGS: Brain: No evidence of acute territorial infarction, hemorrhage, hydrocephalus,extra-axial collection or mass lesion/mass effect. The ventricles are normal in size and contour. Low-attenuation changes in the deep white matter consistent with small vessel ischemia. Vascular: No hyperdense vessel or unexpected calcification. Skull: The skull is intact. No fracture or focal lesion identified. Sinuses/Orbits: The visualized paranasal sinuses and mastoid air cells are clear. The orbits and globes intact. Other: None IMPRESSION: No acute intracranial abnormality. Findings consistent with chronic small vessel ischemia Electronically Signed   By: Prudencio Pair M.D.   On: 12/29/2018 23:08   Dg Chest Port 1 View  Result Date: 12/31/2018 CLINICAL DATA:  Sepsis EXAM: PORTABLE CHEST 1 VIEW COMPARISON:  December 29, 2018 which is FINDINGS: The heart size and mediastinal contours are unchanged. Again noted is prominence to the central pulmonary vasculature. There is mildly increased interstitial markings seen at both lower lungs slightly more pronounced than the prior exam. No pleural effusion. No acute osseous abnormality. IMPRESSION: Pulmonary vascular congestion and mildly increased interstitial markings at both lower lungs, slightly more pronounced than the prior exam. This could be due to interstitial edema and/or early infectious etiology. Electronically Signed   By: Prudencio Pair M.D.   On: 12/31/2018 03:38   Dg Chest Port 1 View  Result Date: 12/29/2018 CLINICAL DATA:  Altered mental status EXAM: PORTABLE CHEST 1 VIEW COMPARISON:  November 12, 2016 FINDINGS: The heart size and mediastinal contours are within normal limits. There is  prominence to the central pulmonary vasculature. No large airspace consolidation or pleural effusion. IMPRESSION: Findings suggestive pulmonary vascular congestion. Electronically Signed   By: Prudencio Pair M.D.   On: 12/29/2018 21:16    Time Spent in minutes  35   Kemiya Batdorf M.D on 01/01/2019 at 1:14 PM  Between 7am to 7pm - Pager - [phone]  After 7pm go to [email or web address] - password [facility]  [facility] -  Office  [phone]

## 2019-01-01 NOTE — Consult Note (Signed)
Pharmacy Antibiotic Note  Audrey Chan is a 68 y.o. female with hx of diabetes, stroke, HTN admitted on 12/29/2018 with sepsis and AMS. Upon evaluation in the ED she was febrile to 104F and tachycardic with HR in the 140s. Initial labs significant for glucose of 965, Scr of 2.0, ALP of 329, mildly prolonged PT, lactate of 2.4, PCT of 0.25, WBC of 12.6 and multiple electrolyte abnormalities.   UA un-remarkable for infectious process. CXR impression of pulmonary vascular congestion. CT head un-remarkable. Respiratory viral panel and COVID-19 test negative. Presentation c/f meningitis but LP in ED was un-successful. Based on age, causative organisms of bacterial meningitis include S.pneumoniae, N. meningitidis, L. monocytogenes, and aerobic gram negative bacilli. Pharmacy has been consulted for vancomycin dosing. Patient is also on broad empiric coverage for bacterial meningitis with acyclovir, ampicillin, ceftriaxone, and metronidazole.   11/11: WBC trend 20.5 >> 18.0 >> 14.8. Temperature has normalized. Scr has improved from 2.0 on admission to 1.08.   Plan:  Vancomycin: Continue vancomycin 1000mg  IV q24h (target trough of 15-20 mcg/mL)  Expected AUC: 533.1  Expected Cssmin: 13.1  Monitor daily Scr per protocol and adjust dosing as indicated.   Ampicillin: Continue ampicillin 2g q6h (CrCl 30 to < 50 mL/min).  Acyclovir:  Change acyclovir to 10mg /kg q8h (CrCl > 90mL/min)  Height: 5\' 7"  (170.2 cm) Weight: 130 lb 4.7 oz (59.1 kg) IBW/kg (Calculated) : 61.6  Temp (24hrs), Avg:99 F (37.2 C), Min:98.5 F (36.9 C), Max:99.4 F (37.4 C)  Recent Labs  Lab 12/29/18 2115 12/29/18 2357  12/30/18 0214 12/30/18 0427 12/30/18 0830 12/30/18 1205 12/30/18 1230 12/30/18 1911 12/31/18 0444 01/01/19 0452  WBC 12.6*  --   --   --  19.4*  --   --  20.5*  --  18.0* 14.8*  CREATININE 2.00*  --    < >  --  1.79* 1.66* 1.54*  --   --  1.27* 1.08*  LATICACIDVEN 3.4* 2.4*  --  5.3* 4.9* 2.6*   --   --  2.3*  --   --    < > = values in this interval not displayed.    Estimated Creatinine Clearance: 45.9 mL/min (A) (by C-G formula based on SCr of 1.08 mg/dL (H)).    No Known Allergies  Antimicrobials this admission: Cefepime 11/8 x1 Vancomycin 11/8 >>  Metronidazole 11/8 >>  Ceftriaxone 11/9 >> Ampicillin 11/9 >> Acyclovir 11/9 >>  Dose adjustments this admission: 11/10: vancomycin regimen altered from 1 g q36h to 1000 mg q24h based on improved clearance and target trough of 15-20 mcg/mL for bacterial meningitis  11/10: ampicillin changed from 1g q6h to 2g q6h given renal function improvement. 11/11: acyclovir changed from 10mg /kg q12h to 10mg /kg q8h given renal function.  Microbiology results: 11/10 MRSA PCR: negative 11/9 Viral Respiratory Panel: negative 11/9 BCx: No growth x 2 days 11/9 HIV antibody: non-reactive 11/8 BCx: no growth x 3 days 11/8 UCx: no growth (final) 11/8 COVID-19: negative  Thank you for allowing pharmacy to be a part of this patient's care.  Raiford Simmonds, PharmD Candidate 01/01/2019 1:09 PM

## 2019-01-01 NOTE — Procedures (Signed)
Patient Name: ALESSANDRIA STALDER  MRN: [id number]  Epilepsy Attending: Lora Havens  Referring Physician/Provider: Dr Leotis Pain Date: 01/01/2019 Duration: 24.42 mins  Patient history: 68yo F with ams. EEG to evaluate for seizure  Level of alertness: awake  AEDs during EEG study: None  Technical aspects: This EEG study was done with scalp electrodes positioned according to the 10-20 International system of electrode placement. Electrical activity was acquired at a sampling rate of 500Hz  and reviewed with a high frequency filter of 70Hz  and a low frequency filter of 1Hz . EEG data were recorded continuously and digitally stored.   DESCRIPTION: During awake state, no clear posterior dominant rhythm was seen. EEG showed continuous generalized polymorphic 3-6hz  theta-delta slowing as well as intermittent rhythmic generalized and maximum bifrontal 2-4hz  delta slowing. Hyperventilation and photic stimulation were not performed.  ABNORMALITY - Continuous slow, generalized - Intermittent rhythmic slow, generalized, maximum bifrontal  IMPRESSION: This study is suggestive of moderate diffuse encephalopathy, non specific to etiology. No seizures or epileptiform discharges were seen throughout the recording.

## 2019-01-01 NOTE — Progress Notes (Signed)
eeg completed ° °

## 2019-01-02 ENCOUNTER — Other Ambulatory Visit: Payer: Self-pay

## 2019-01-02 DIAGNOSIS — I10 Essential (primary) hypertension: Secondary | ICD-10-CM

## 2019-01-02 DIAGNOSIS — I639 Cerebral infarction, unspecified: Secondary | ICD-10-CM

## 2019-01-02 LAB — HEMOGLOBIN A1C
Hgb A1c MFr Bld: 9.7 % — ABNORMAL HIGH (ref 4.8–5.6)
Mean Plasma Glucose: 231.69 mg/dL

## 2019-01-02 LAB — BASIC METABOLIC PANEL
Anion gap: 10 (ref 5–15)
BUN: 6 mg/dL — ABNORMAL LOW (ref 8–23)
CO2: 21 mmol/L — ABNORMAL LOW (ref 22–32)
Calcium: 8.5 mg/dL — ABNORMAL LOW (ref 8.9–10.3)
Chloride: 109 mmol/L (ref 98–111)
Creatinine, Ser: 0.92 mg/dL (ref 0.44–1.00)
GFR calc Af Amer: >60 mL/min (ref >60)
GFR calc non Af Amer: >60 mL/min (ref >60)
Glucose, Bld: 105 mg/dL — ABNORMAL HIGH (ref 70–99)
Potassium: 3.3 mmol/L — ABNORMAL LOW (ref 3.5–5.1)
Sodium: 140 mmol/L (ref 135–145)

## 2019-01-02 LAB — GLUCOSE, CAPILLARY
Glucose-Capillary: 142 mg/dL — ABNORMAL HIGH (ref 70–99)
Glucose-Capillary: 131 mg/dL — ABNORMAL HIGH (ref 70–99)
Glucose-Capillary: 276 mg/dL — ABNORMAL HIGH (ref 70–99)
Glucose-Capillary: 193 mg/dL — ABNORMAL HIGH (ref 70–99)
Glucose-Capillary: 200 mg/dL — ABNORMAL HIGH (ref 70–99)

## 2019-01-02 LAB — MAGNESIUM: Magnesium: 2.1 mg/dL (ref 1.7–2.4)

## 2019-01-02 MED ORDER — LISINOPRIL 20 MG PO TABS
20.0000 mg | ORAL_TABLET | Freq: Every day | ORAL | Status: DC
  Administered 2019-01-03: 20 mg via ORAL
  Filled 2019-01-02: qty 1

## 2019-01-02 MED ORDER — AMLODIPINE BESYLATE 5 MG PO TABS
5.0000 mg | ORAL_TABLET | Freq: Every day | ORAL | Status: DC
  Administered 2019-01-02 – 2019-01-03 (×2): 5 mg via ORAL
  Filled 2019-01-02 (×2): qty 1

## 2019-01-02 MED ORDER — ENOXAPARIN SODIUM 40 MG/0.4ML ~~LOC~~ SOLN
40.0000 mg | SUBCUTANEOUS | Status: DC
  Administered 2019-01-02: 40 mg via SUBCUTANEOUS
  Filled 2019-01-02: qty 0.4

## 2019-01-02 MED ORDER — POTASSIUM CHLORIDE CRYS ER 20 MEQ PO TBCR
40.0000 meq | EXTENDED_RELEASE_TABLET | Freq: Once | ORAL | Status: AC
  Administered 2019-01-02: 40 meq via ORAL
  Filled 2019-01-02: qty 2

## 2019-01-02 NOTE — Evaluation (Signed)
Physical Therapy Evaluation Patient Details Name: Audrey Chan MRN: [id number] DOB: 1950-05-19 Today's Date: 01/02/2019   History of Present Illness  presented to ER secondary to AMS, combativeness; admitted for management of acute metabolic encephalopathy due to DKA and unknown infectious source.  Clinical Impression  Upon evaluation, patient alert and oriented to basic information; follows commands and demonstrates good effort with mobility tasks.  Slightly impulsive at times, with intermittent confusion and decreased insight/safety awareness noted during session.  Bilat UE/LE strength and ROM grossly symmetrical and WFL; no focal weakness, sensory or coordination deficit appreciated.  Able to complete bed mobility indep; sit/stand, basic transfers and gait (300') without assist device, cga/close sup.  Demonstrates  reciprocal stepping pattern with fair step height/length, fair trunk rotation and arm swing; mild sway with dynamic gait components (head turns, start/stop, change of direction and speed modulation), but able to self-correct without any LOB.  Will plan to assess stairs and additional dynamic balance/gait activities next session. Would benefit from skilled PT to address above deficits and promote optimal return to PLOF.; will maintain on caseload throughout remaining stay, but anticipate no formal PT needs upon discharge.  Do recommend 24 hour sup initially upon discharge due to persistent cognitive deficits.    Follow Up Recommendations No PT follow up;Supervision/Assistance - 24 hour(will maintain on caseload throughout remaining stay)    Equipment Recommendations       Recommendations for Other Services       Precautions / Restrictions Precautions Precautions: Fall Restrictions Weight Bearing Restrictions: No      Mobility  Bed Mobility Overal bed mobility: Modified Independent                Transfers Overall transfer level: Modified  independent Equipment used: None                Ambulation/Gait Ambulation/Gait assistance: Modified independent (Device/Increase time) Gait Distance (Feet): 250 Feet Assistive device: None       General Gait Details: reciprocal stepping pattern with fair step height/length, fair trunk rotation and arm swing; mild sway with dynamic gait components (head turns, start/stop, change of direction and speed modulation), but able to self-correct without any LOB  Stairs            Wheelchair Mobility    Modified Rankin (Stroke Patients Only)       Balance                                             Pertinent Vitals/Pain Pain Assessment: No/denies pain    Home Living Family/patient expects to be discharged to:: Private residence Living Arrangements: Alone Available Help at Discharge: Family;Available PRN/intermittently Type of Home: House Home Access: Stairs to enter Entrance Stairs-Rails: Right;Left;Can reach both Entrance Stairs-Number of Steps: 4 Home Layout: One level Home Equipment: None Additional Comments: Reports sister coming to live with her upon discharge; sister not present to verify    Prior Function Level of Independence: Independent               Hand Dominance        Extremity/Trunk Assessment   Upper Extremity Assessment Upper Extremity Assessment: Overall WFL for tasks assessed(grossly 4+/5 throughout; no focal weakness, sensory or coordination deficit)    Lower Extremity Assessment Lower Extremity Assessment: Overall WFL for tasks assessed(grossly 4+/5 throughout; no focal weakness, sensory or coordination deficit)  Communication   Communication: No difficulties  Cognition Arousal/Alertness: Awake/alert Behavior During Therapy: WFL for tasks assessed/performed Overall Cognitive Status: No family/caregiver present to determine baseline cognitive functioning                                  General Comments: oriented to self, location, general situation; intermittent confusion, poor safety awareness evident      General Comments      Exercises     Assessment/Plan    PT Assessment Patient needs continued PT services  PT Problem List Decreased activity tolerance;Decreased balance;Decreased mobility;Decreased coordination;Decreased cognition;Decreased knowledge of use of DME;Decreased safety awareness;Decreased knowledge of precautions       PT Treatment Interventions DME instruction;Gait training;Stair training;Functional mobility training;Therapeutic activities;Therapeutic exercise;Balance training;Neuromuscular re-education;Cognitive remediation;Patient/family education    PT Goals (Current goals can be found in the Care Plan section)  Acute Rehab PT Goals Patient Stated Goal: to return home PT Goal Formulation: With patient Time For Goal Achievement: 01/16/19 Potential to Achieve Goals: Good    Frequency Min 2X/week   Barriers to discharge        Co-evaluation               AM-PAC PT "6 Clicks" Mobility  Outcome Measure Help needed turning from your back to your side while in a flat bed without using bedrails?: None Help needed moving from lying on your back to sitting on the side of a flat bed without using bedrails?: None Help needed moving to and from a bed to a chair (including a wheelchair)?: None Help needed standing up from a chair using your arms (e.g., wheelchair or bedside chair)?: None Help needed to walk in hospital room?: A Little Help needed climbing 3-5 steps with a railing? : A Little 6 Click Score: 22    End of Session Equipment Utilized During Treatment: Gait belt Activity Tolerance: Patient tolerated treatment well Patient left: in chair;with call bell/phone within reach Nurse Communication: Mobility status PT Visit Diagnosis: Muscle weakness (generalized) (M62.81);Difficulty in walking, not elsewhere classified (R26.2)     Time: [id number] PT Time Calculation (min) (ACUTE ONLY): 20 min   Charges:   PT Evaluation $PT Eval Moderate Complexity: 1 Mod PT Treatments $Therapeutic Activity: 8-22 mins       Shawnee Gambone H. Owens Shark, PT, DPT, NCS 01/02/19, 4:38 PM [phone]

## 2019-01-02 NOTE — Progress Notes (Signed)
Pt transferred to RM # [id number] at this time by wheelchair. VSS prior to transfer. Report given to Mickel Baas, RN.

## 2019-01-02 NOTE — Progress Notes (Signed)
Inpatient Diabetes Program Recommendations  AACE/ADA: New Consensus Statement on Inpatient Glycemic Control (2015)  Target Ranges:  Prepandial:   less than 140 mg/dL      Peak postprandial:   less than 180 mg/dL (1-2 hours)      Critically ill patients:  140 - 180 mg/dL   Lab Results  Component Value Date   GLUCAP 276 (H) 01/02/2019    Review of Glycemic Control  Spoke with patient in room. Pt reports being taken off of her insulin pump at Dr. Joycie Peek office and being palced on Lantus and Apidra with meals. Pt reports she sometimes forgot to take her insulin doses. Pt thought her glucose meter does not work because it was reading "high".  Discussed setting alarms or reminders on phone. Pt also reports her sister will be coming to stay with her for w little bit after d/c until she is back on her feet.  Pt requests a new glucose meter at time of d/c. Pt says she uses the [facility] at [address].  Thanks,  Tama Headings RN, MSN, BC-ADM Inpatient Diabetes Coordinator Team Pager [phone] (8a-5p)

## 2019-01-02 NOTE — Progress Notes (Signed)
Subjective: She is close to baseline. Was able to ambulate without assistance today. No signs of neck stiffness.    Past Medical History:  Diagnosis Date  . Allergic rhinitis   . Aneurysm of unspecified site ([place])   . Cholelithiasis   . Diabetes type 2, controlled ([place])   . GERD (gastroesophageal reflux disease)   . Hypertension   . Neuropathy   . Veno-occlusive disease     Past Surgical History:  Procedure Laterality Date  . ABDOMINAL HYSTERECTOMY  1972  . CERVICAL FUSION    . CHOLECYSTECTOMY N/A 12/28/2014   Procedure: LAPAROSCOPIC CHOLECYSTECTOMY;  Surgeon: Sherri Rad, MD;  Location: [facility] ORS;  Service: General;  Laterality: N/A;  . EYE SURGERY     detached retina  . MASS EXCISION Left 05/06/2018   Procedure: EXCISION OF LEFT ANTECUBITAL CYST, DIABETIC;  Surgeon: Vickie Epley, MD;  Location: [facility] ORS;  Service: Vascular;  Laterality: Left;    Family History  Problem Relation Age of Onset  . Lung cancer Father     Social History:  reports that she quit smoking about 19 years ago. She has never used smokeless tobacco. She reports that she does not drink alcohol or use drugs.  No Known Allergies  Medications: I have reviewed the patient's current medications.   Physical Examination: Blood pressure (!) 176/101, pulse (!) 101, temperature 98.6 F (37 C), temperature source Oral, resp. rate 18, height 5\' 7"  (1.702 m), weight 59.1 kg, SpO2 98 %.     Neurological Examination   Mental Status: Alert, oriented to name and location. Speech is fluent  Cranial Nerves: II: Discs flat bilaterally; Visual fields grossly normal, pupils equal, round, reactive to light and accommodation III,IV, VI: ptosis not present, extra-ocular motions intact bilaterally V,VII: smile symmetric, facial light touch sensation normal bilaterally VIII: hearing normal bilaterally IX,X: gag reflex present XI: bilateral shoulder shrug XII: midline tongue extension Motor: Generalized weakness  bilaterally but improved  Tone and bulk:normal tone throughout; no atrophy noted Sensory: Pinprick and light touch intact throughout, bilaterally Deep Tendon Reflexes: 1+ and symmetric throughout Plantars: Right: downgoing   Left: downgoing Cerebellar: Not tested.       Laboratory Studies:   Basic Metabolic Panel: Recent Labs  Lab 12/30/18 0830 12/30/18 1205 12/31/18 0444 01/01/19 0452 01/01/19 0457 01/02/19 0613  NA 138 139 139 141  --  140  K 3.4* 3.5 3.1* 3.0*  --  3.3*  CL 107 113* 111 111  --  109  CO2 19* 20* 19* 17*  --  21*  GLUCOSE 187* 137* 186* 198*  --  105*  BUN 25* 21 17 10   --  6*  CREATININE 1.66* 1.54* 1.27* 1.08*  --  0.92  CALCIUM 8.7* 8.5* 8.2* 8.3*  --  8.5*  MG 1.5*  --   --   --  1.2* 2.1  PHOS 1.6*  --   --   --  2.9  --     Liver Function Tests: Recent Labs  Lab 12/29/18 2115 12/31/18 0444  AST 25 81*  ALT 44 45*  ALKPHOS 329* 203*  BILITOT 1.1 0.5  PROT 8.6* 6.4*  ALBUMIN 4.2 3.0*   Recent Labs  Lab 12/29/18 2115  LIPASE 13   Recent Labs  Lab 01/01/19 0457  AMMONIA 20    CBC: Recent Labs  Lab 12/29/18 2115 12/30/18 0427 12/30/18 1230 12/31/18 0444 01/01/19 0452  WBC 12.6* 19.4* 20.5* 18.0* 14.8*  NEUTROABS 11.1*  --   --  13.0*  --   HGB 11.4* 10.8* 10.3* 9.7* 9.2*  HCT 35.2* 32.6* 30.6* 29.8* 26.5*  MCV 73.5* 71.0* 70.3* 72.3* 67.8*  PLT 362 302 284 237 212    Cardiac Enzymes: No results for input(s): CKTOTAL, CKMB, CKMBINDEX, TROPONINI in the last 168 hours.  BNP: Invalid input(s): POCBNP  CBG: Recent Labs  Lab 01/01/19 1922 01/01/19 2340 01/02/19 0323 01/02/19 0717 01/02/19 1127  GLUCAP 161* 154* 142* 131* 37*    Microbiology: Results for orders placed or performed during the hospital encounter of 12/29/18  Blood Culture (routine x 2)     Status: None (Preliminary result)   Collection Time: 12/29/18  9:15 PM   Specimen: BLOOD  Result Value Ref Range Status   Specimen Description BLOOD BLOOD  RIGHT FOREARM  Final   Special Requests   Final    BOTTLES DRAWN AEROBIC AND ANAEROBIC Blood Culture adequate volume   Culture   Final    NO GROWTH 4 DAYS Performed at [facility], [address], [place], [place] [address]    Report Status PENDING  Incomplete  Urine culture     Status: None   Collection Time: 12/29/18  9:15 PM   Specimen: In/Out Cath Urine  Result Value Ref Range Status   Specimen Description   Final    IN/OUT CATH URINE Performed at [facility], [address], [place], [place] [address]    Special Requests   Final    NONE Performed at [facility], [address] [place] [address] [place], [place] [address]    Culture   Final    NO GROWTH Performed at [facility], [place] [address], [place], [place] [address]    Report Status 12/31/2018 FINAL  Final  SARS CORONAVIRUS 2 (TAT 6-24 HRS) Nasopharyngeal Nasopharyngeal Swab     Status: None   Collection Time: 12/29/18  9:15 PM   Specimen: Nasopharyngeal Swab  Result Value Ref Range Status   SARS Coronavirus 2 NEGATIVE NEGATIVE Final    Comment: (NOTE) SARS-CoV-2 target nucleic acids are NOT DETECTED. The SARS-CoV-2 RNA is generally detectable in upper and lower respiratory specimens during the acute phase of infection. Negative results do not preclude SARS-CoV-2 infection, do not rule out co-infections with other pathogens, and should not be used as the sole basis for treatment or other patient management decisions. Negative results must be combined with clinical observations, patient history, and epidemiological information. The expected result is Negative. Fact Sheet for Patients: SugarRoll.be Fact Sheet for Healthcare Providers: https://www.woods-mathews.com/ This test is not yet approved or cleared by the [place] FDA and  has been authorized for detection and/or diagnosis of SARS-CoV-2 by FDA under an Emergency Use Authorization (EUA).  This EUA will remain  in effect (meaning this test can be used) for the duration of the COVID-19 declaration under Section 56 4(b)(1) of the Act, 21 U.S.C. section 360bbb-3(b)(1), unless the authorization is terminated or revoked sooner. Performed at [facility], [place] [address], [place],  [address]   Respiratory Panel by PCR     Status: None   Collection Time: 12/30/18 12:17 AM   Specimen: Nasopharyngeal Swab; Respiratory  Result Value Ref Range Status   Adenovirus NOT DETECTED NOT DETECTED Final   Coronavirus 229E NOT DETECTED NOT DETECTED Final    Comment: (NOTE) The Coronavirus on the Respiratory Panel, DOES NOT test for the novel  Coronavirus (2019 nCoV)    Coronavirus HKU1 NOT DETECTED NOT DETECTED Final  Coronavirus NL63 NOT DETECTED NOT DETECTED Final   Coronavirus OC43 NOT DETECTED NOT DETECTED Final   Metapneumovirus NOT DETECTED NOT DETECTED Final   Rhinovirus / Enterovirus NOT DETECTED NOT DETECTED Final   Influenza A NOT DETECTED NOT DETECTED Final   Influenza B NOT DETECTED NOT DETECTED Final   Parainfluenza Virus 1 NOT DETECTED NOT DETECTED Final   Parainfluenza Virus 2 NOT DETECTED NOT DETECTED Final   Parainfluenza Virus 3 NOT DETECTED NOT DETECTED Final   Parainfluenza Virus 4 NOT DETECTED NOT DETECTED Final   Respiratory Syncytial Virus NOT DETECTED NOT DETECTED Final   Bordetella pertussis NOT DETECTED NOT DETECTED Final   Chlamydophila pneumoniae NOT DETECTED NOT DETECTED Final   Mycoplasma pneumoniae NOT DETECTED NOT DETECTED Final    Comment: Performed at [facility], [place] [address], [place], [place] [address]  Culture, blood (single) w Reflex to ID Panel     Status: None (Preliminary result)   Collection Time: 12/30/18  7:11 PM   Specimen: BLOOD  Result Value Ref Range Status   Specimen Description BLOOD Blood Culture adequate volume  Final   Special Requests BOTTLES DRAWN AEROBIC ONLY BLOOD LEFT FOREARM  Final   Culture   Final     NO GROWTH 3 DAYS Performed at [facility], [address] [place], [place] [address]    Report Status PENDING  Incomplete  MRSA PCR Screening     Status: None   Collection Time: 12/31/18  8:43 PM   Specimen: Nasopharyngeal  Result Value Ref Range Status   MRSA by PCR NEGATIVE NEGATIVE Final    Comment:        The GeneXpert MRSA Assay (FDA approved for NASAL specimens only), is one component of a comprehensive MRSA colonization surveillance program. It is not intended to diagnose MRSA infection nor to guide or monitor treatment for MRSA infections. Performed at [facility], [address] [place], [place] [address]     Coagulation Studies: No results for input(s): LABPROT, INR in the last 72 hours.  Urinalysis:  Recent Labs  Lab 12/29/18 2115  COLORURINE YELLOW*  LABSPEC 1.020  PHURINE 5.0  GLUCOSEU >=500*  HGBUR SMALL*  BILIRUBINUR NEGATIVE  KETONESUR 20*  PROTEINUR 30*  NITRITE NEGATIVE  LEUKOCYTESUR NEGATIVE    Lipid Panel:  No results found for: CHOL, TRIG, HDL, CHOLHDL, VLDL, LDLCALC  HgbA1C: No results found for: HGBA1C  Urine Drug Screen:  No results found for: LABOPIA, COCAINSCRNUR, LABBENZ, AMPHETMU, THCU, LABBARB  Alcohol Level: No results for input(s): ETH in the last 168 hours.  Other results: EKG: normal EKG, normal sinus rhythm, unchanged from previous tracings.  Imaging: Ct Head Wo Contrast  Result Date: 12/31/2018 CLINICAL DATA:  Seizure EXAM: CT HEAD WITHOUT CONTRAST TECHNIQUE: Contiguous axial images were obtained from the base of the skull through the vertex without intravenous contrast. COMPARISON:  None. FINDINGS: Brain: There is no acute intracranial hemorrhage, mass-effect, or edema. There is no new loss of gray-white differentiation. Confluent areas of hypoattenuation in the supratentorial white matter are nonspecific but likely reflect stable advanced microvascular ischemic changes. Chronic small infarcts of the  left cerebellum and thalamus again noted. There is no extra-axial collection. Ventricles are stable in size. Vascular: No hyperdense vessel. Skull: Calvarium is unremarkable. Sinuses/Orbits: Mild paranasal sinus mucosal thickening. Orbits are unremarkable. Other: Mastoid air cells are clear. IMPRESSION: No acute or new findings since December 29, 2018. Stable chronic findings detailed above. Electronically Signed   By: Malachi Carl  Patel M.D.   On: 12/31/2018 13:15   Mr Jeri Cos [id number] Contrast  Result Date: 01/01/2019 CLINICAL DATA:  Encephalopathy.  DKA. EXAM: MRI HEAD WITHOUT AND WITH CONTRAST TECHNIQUE: Multiplanar, multiecho pulse sequences of the brain and surrounding structures were obtained without and with intravenous contrast. CONTRAST:  83mL GADAVIST GADOBUTROL 1 MMOL/ML IV SOLN COMPARISON:  CT head 12/31/2018 FINDINGS: Brain: Small acute infarct left inferior cerebellum. No other acute infarct Chronic infarct left inferior cerebellum. Extensive white matter disease bilaterally. Diffuse subcortical and deep white matter hyperintensity throughout both cerebral hemispheres. Hyperintensity is also noted in the right thalamus. Negative for hemorrhage or mass. Ventricle size normal. Normal enhancement postcontrast administration Vascular: Normal arterial flow voids Skull and upper cervical spine: No focal bony abnormality. Cervical spine fusion hardware. Sinuses/Orbits: Negative Other: None IMPRESSION: 1. Acute small infarct left medial cerebellum. 2. Extensive disease throughout the white matter most compatible with chronic ischemia. Chronic infarct left cerebellum. Electronically Signed   By: Franchot Gallo M.D.   On: 01/01/2019 15:05     Assessment/Plan:  69 y.o. female with medical history significant of Type 2 diabetes with neuropathy,CVA, hypertension, COPD, hyperlipidemia, who presents reportedly with altered mental status. Pt is obtunded and unable to provide history. No family at bedside. Reportedly  patient was noted to be in her normal state of health two days ago but was later found by neighbors in the afternoon with altered mental status and EMS was called. Patient was combative with EMS and had to be chemically restraint with 5mg  of Haldol. She was initially febrile up to 104.1 rectally. She was tachycardic with glc of 965 and gap of 22. Pt admitted for suspicion of DKA. CXR with bilateral infiltarats and is on broad spectrum antibiotics as well as to rule out meningitis.   - Fevers improved. Tmax 99.4. Currently not febrile - MRI incidental stroke L cerebellum, on dual anti platelet therapy. Likely due to small vessel disease - EEG with slowing no clear focality in temporal lobes to suspect HSV - given the improvement not convinced she needs LP - So much improved and close to baseline, suspect can be d/c from step down   01/02/2019, 11:57 AM

## 2019-01-02 NOTE — Progress Notes (Addendum)
PROGRESS NOTE                                                                                                                                                                                                             Patient Demographics:    Audrey Chan, is a 68 y.o. female, DOB - October 20, 1950, [id number]  Admit date - 12/29/2018   Admitting Physician Edwin Dada, MD  Outpatient Primary MD for the patient is Revelo, Elyse Jarvis, MD  LOS - 3  Outpatient Specialists: None  Chief Complaint  Patient presents with  . Altered Mental Status       Brief Narrative  68 year old female with history of diabetes mellitus, stroke and hypertension presented with acute change in mental status.  Her boyfriend found her confused on the morning of admission, later during the day was found in bed covered in feces.  When EMS arrived she was found to be combative and febrile to 104 F and tachycardic to 140s.  She was found to be in DKA with anion gap of 22, lactic acid of 3.4 and glucose of 965.  WBC of 12.6K.  Chest x-ray showing bilateral infiltrates.  Head CT unremarkable. Placed on IV fluids, insulin drip.  LP attempted but failed.  Started on empiric IV antibiotics and admitted to stepdown unit.  Neurology consulted.   Subjective:   Patient seems to be very well oriented today.  Denies any headache or dizziness.   Assessment  & Plan :    Principle problem Acute metabolic encephalopathy Suspect secondary to DKA and sepsis of unclear etiology.  Finally got EEG which was negative for epileptiform discharge.  Failed LP on admission.  Mental status now resolved to baseline.  Does not need LP.  Off all antibiotic except for Rocephin for possible pneumonia.  Neurology consult appreciated.    Active problems Left cerebellar stroke Small and incidental finding on MRI.  Already on dual antiplatelet therapy.  Continue  statin.  PT evaluation.  Outpatient neurology and stroke team follow-up. Recent history of stroke 3 months back at [facility] without residual deficit.  Continue aspirin, Plavix and statin.  Tight blood pressure control.     Sepsis with acute renal failure without septic shock (HCC) Unclear etiology.  Was being treated with empiric antibiotic and acyclovir for suspected meningitis.  Mental status now  significantly improved.  MRI negative for encephalitis.  Discontinued antibiotics and antiviral.      DKA (diabetic ketoacidoses) ([place]) Resolved with aggressive IV hydration and insulin drip.  CBG currently stable on Lantus and sliding scale correction.    Holding home Metformin and gabapentin.  Check hemoglobin A1c    AKI (acute kidney injury) ([place]) Likely prerenal with severe dehydration and sepsis.  Resolved with IV hydration.  Resume ACE inhibitor.  Uncontrolled hypertension Needs aggressive blood pressure control for secondary stroke prevention.  Resumed ACE inhibitor and dose increased, resume HCTZ.  Added amlodipine.   Lactic acidosis Secondary to sepsis.  Now resolved.  Holding Metformin  COPD Resume home inhalers  Anemia Continue iron supplement.  Monitor closely.    Hypokalemia hypomagnesemia Replenished   Code Status : Full code  Family Communication  : None at bedside  Disposition Plan  : Transfer to [place].  Home possibly tomorrow blood pressure better controlled.  Barriers For Discharge : Active symptoms  Consults  : Neurology  Procedures  : CT head, MRI brain  DVT Prophylaxis  :  Lovenox -  Lab Results  Component Value Date   PLT 212 01/01/2019    Antibiotics  :    Anti-infectives (From admission, onward)   Start     Dose/Rate Route Frequency Ordered Stop   01/02/19 1016  cefTRIAXone (ROCEPHIN) 2 g in sodium chloride 0.9 % 100 mL IVPB     2 g 200 mL/hr over 30 Minutes Intravenous Every 24 hours 01/01/19 1642     01/01/19 0950  vancomycin (VANCOCIN)  IVPB 750 mg/150 ml premix  Status:  Discontinued     750 mg 150 mL/hr over 60 Minutes Intravenous Every 24 hours 12/31/18 1034 12/31/18 1104   01/01/19 0950  vancomycin (VANCOCIN) IVPB 1000 mg/200 mL premix  Status:  Discontinued     1,000 mg 200 mL/hr over 60 Minutes Intravenous Every 24 hours 12/31/18 1104 01/01/19 1642   12/31/18 2000  acyclovir (ZOVIRAX) 600 mg in dextrose 5 % 100 mL IVPB  Status:  Discontinued     10 mg/kg  60 kg 112 mL/hr over 60 Minutes Intravenous Every 12 hours 12/31/18 1109 01/01/19 1642   12/31/18 1200  ampicillin (OMNIPEN) 2 g in sodium chloride 0.9 % 100 mL IVPB  Status:  Discontinued     2 g 300 mL/hr over 20 Minutes Intravenous Every 6 hours 12/31/18 1109 01/01/19 1642   12/31/18 1000  vancomycin (VANCOCIN) IVPB 1000 mg/200 mL premix  Status:  Discontinued     1,000 mg 200 mL/hr over 60 Minutes Intravenous Every 36 hours 12/30/18 0551 12/31/18 1034   12/30/18 2200  ceFEPIme (MAXIPIME) 2 g in sodium chloride 0.9 % 100 mL IVPB  Status:  Discontinued     2 g 200 mL/hr over 30 Minutes Intravenous Every 24 hours 12/30/18 0552 12/30/18 1833   12/30/18 2200  cefTRIAXone (ROCEPHIN) 2 g in sodium chloride 0.9 % 100 mL IVPB  Status:  Discontinued     2 g 200 mL/hr over 30 Minutes Intravenous Every 12 hours 12/30/18 1833 01/01/19 1642   12/30/18 1900  acyclovir (ZOVIRAX) 600 mg in dextrose 5 % 100 mL IVPB  Status:  Discontinued     10 mg/kg  60 kg 112 mL/hr over 60 Minutes Intravenous Every 8 hours 12/30/18 1730 12/31/18 1109   12/30/18 1800  ampicillin (OMNIPEN) 1 g in sodium chloride 0.9 % 100 mL IVPB  Status:  Discontinued     1 g  300 mL/hr over 20 Minutes Intravenous Every 6 hours 12/30/18 1730 12/31/18 1109   12/30/18 0600  metroNIDAZOLE (FLAGYL) IVPB 500 mg  Status:  Discontinued     500 mg 100 mL/hr over 60 Minutes Intravenous Every 8 hours 12/30/18 0037 01/01/19 1642   12/29/18 2100  vancomycin (VANCOCIN) 500 mg in sodium chloride 0.9 % 100 mL IVPB      500 mg 100 mL/hr over 60 Minutes Intravenous  Once 12/29/18 2046 12/30/18 0249   12/29/18 2045  ceFEPIme (MAXIPIME) 2 g in sodium chloride 0.9 % 100 mL IVPB     2 g 200 mL/hr over 30 Minutes Intravenous  Once 12/29/18 2031 12/29/18 2213   12/29/18 2045  metroNIDAZOLE (FLAGYL) IVPB 500 mg     500 mg 100 mL/hr over 60 Minutes Intravenous  Once 12/29/18 2031 12/29/18 2238   12/29/18 2045  vancomycin (VANCOCIN) IVPB 1000 mg/200 mL premix     1,000 mg 200 mL/hr over 60 Minutes Intravenous  Once 12/29/18 2031 12/29/18 2332        Objective:   Vitals:   01/02/19 1000 01/02/19 1200 01/02/19 1300 01/02/19 1400  BP: (!) 176/101 (!) 172/75 (!) 168/128 104/85  Pulse: (!) 101     Resp: 18 16 19 17   Temp:      TempSrc:      SpO2: 98%     Weight:      Height:        Wt Readings from Last 3 Encounters:  12/31/18 59.1 kg  10/07/18 60.3 kg  09/15/18 63.5 kg     Intake/Output Summary (Last 24 hours) at 01/02/2019 1409 Last data filed at 01/02/2019 0900 Gross per 24 hour  Intake 342.71 ml  Output -  Net 342.71 ml    Physical exam Not in distress HEENT: Single, supple neck Chest: Clear CVs: Normal S1-S2 GI: Soft, nondistended, nontender Musculoskeletal: Warm, no edema CNS: Alert and oriented, nonfocal    Data Review:    CBC Recent Labs  Lab 12/29/18 2115 12/30/18 0427 12/30/18 1230 12/31/18 0444 01/01/19 0452  WBC 12.6* 19.4* 20.5* 18.0* 14.8*  HGB 11.4* 10.8* 10.3* 9.7* 9.2*  HCT 35.2* 32.6* 30.6* 29.8* 26.5*  PLT 362 302 284 237 212  MCV 73.5* 71.0* 70.3* 72.3* 67.8*  MCH 23.8* 23.5* 23.7* 23.5* 23.5*  MCHC 32.4 33.1 33.7 32.6 34.7  RDW 13.4 12.7 12.9 13.1 12.8  LYMPHSABS 1.0  --   --  2.8  --   MONOABS 0.3  --   --  1.5*  --   EOSABS 0.0  --   --  0.4  --   BASOSABS 0.1  --   --  0.2*  --     Chemistries  Recent Labs  Lab 12/29/18 2115  12/30/18 0830 12/30/18 1205 12/31/18 0444 01/01/19 0452 01/01/19 0457 01/02/19 0613  NA 123*   < > 138 139 139  141  --  140  K 5.6*   < > 3.4* 3.5 3.1* 3.0*  --  3.3*  CL 85*   < > 107 113* 111 111  --  109  CO2 16*   < > 19* 20* 19* 17*  --  21*  GLUCOSE 965*   < > 187* 137* 186* 198*  --  105*  BUN 27*   < > 25* 21 17 10   --  6*  CREATININE 2.00*   < > 1.66* 1.54* 1.27* 1.08*  --  0.92  CALCIUM 9.6   < >  8.7* 8.5* 8.2* 8.3*  --  8.5*  MG  --   --  1.5*  --   --   --  1.2* 2.1  AST 25  --   --   --  81*  --   --   --   ALT 44  --   --   --  45*  --   --   --   ALKPHOS 329*  --   --   --  203*  --   --   --   BILITOT 1.1  --   --   --  0.5  --   --   --    < > = values in this interval not displayed.   ------------------------------------------------------------------------------------------------------------------ No results for input(s): CHOL, HDL, LDLCALC, TRIG, CHOLHDL, LDLDIRECT in the last 72 hours.  No results found for: HGBA1C ------------------------------------------------------------------------------------------------------------------ No results for input(s): TSH, T4TOTAL, T3FREE, THYROIDAB in the last 72 hours.  Invalid input(s): FREET3 ------------------------------------------------------------------------------------------------------------------ No results for input(s): VITAMINB12, FOLATE, FERRITIN, TIBC, IRON, RETICCTPCT in the last 72 hours.  Coagulation profile Recent Labs  Lab 12/29/18 2115  INR 1.4*    No results for input(s): DDIMER in the last 72 hours.  Cardiac Enzymes No results for input(s): CKMB, TROPONINI, MYOGLOBIN in the last 168 hours.  Invalid input(s): CK ------------------------------------------------------------------------------------------------------------------ No results found for: BNP  Inpatient Medications  Scheduled Meds: . amLODipine  5 mg Oral Daily  . aspirin EC  81 mg Oral Daily  . Chlorhexidine Gluconate Cloth  6 each Topical Daily  . cholecalciferol  1,000 Units Oral Daily  . clopidogrel  75 mg Oral Daily  . enoxaparin  (LOVENOX) injection  40 mg Subcutaneous Q24H  . ferrous sulfate  325 mg Oral BID WC  . hydrochlorothiazide  25 mg Oral Daily  . insulin aspart  0-15 Units Subcutaneous Q4H  . insulin glargine  15 Units Subcutaneous Q24H  . [START ON 01/03/2019] lisinopril  20 mg Oral Daily  . LORazepam  2 mg Intravenous Once  . multivitamin with minerals  1 tablet Oral Daily  . rosuvastatin  20 mg Oral Daily  . umeclidinium-vilanterol  1 puff Inhalation Daily   Continuous Infusions: . sodium chloride Stopped (01/01/19 1213)  . cefTRIAXone (ROCEPHIN)  IV 2 g (01/02/19 0944)   PRN Meds:.sodium chloride, acetaminophen, albuterol, fluticasone, hydrALAZINE, ipratropium-albuterol, labetalol  Micro Results Recent Results (from the past 240 hour(s))  Blood Culture (routine x 2)     Status: None (Preliminary result)   Collection Time: 12/29/18  9:15 PM   Specimen: BLOOD  Result Value Ref Range Status   Specimen Description BLOOD BLOOD RIGHT FOREARM  Final   Special Requests   Final    BOTTLES DRAWN AEROBIC AND ANAEROBIC Blood Culture adequate volume   Culture   Final    NO GROWTH 4 DAYS Performed at [facility], [address] [place], [place] [address]    Report Status PENDING  Incomplete  Urine culture     Status: None   Collection Time: 12/29/18  9:15 PM   Specimen: In/Out Cath Urine  Result Value Ref Range Status   Specimen Description   Final    IN/OUT CATH URINE Performed at [facility], [address], [place], [place] [address]    Special Requests   Final    NONE Performed at [facility], [address] [place], [place] [address]    Culture   Final    NO GROWTH  Performed at [facility], [place] [address] [place], [place] [address]    Report Status 12/31/2018 FINAL  Final  SARS CORONAVIRUS 2 (TAT 6-24 HRS) Nasopharyngeal Nasopharyngeal Swab     Status: None   Collection Time: 12/29/18  9:15 PM   Specimen: Nasopharyngeal Swab  Result Value Ref  Range Status   SARS Coronavirus 2 NEGATIVE NEGATIVE Final    Comment: (NOTE) SARS-CoV-2 target nucleic acids are NOT DETECTED. The SARS-CoV-2 RNA is generally detectable in upper and lower respiratory specimens during the acute phase of infection. Negative results do not preclude SARS-CoV-2 infection, do not rule out co-infections with other pathogens, and should not be used as the sole basis for treatment or other patient management decisions. Negative results must be combined with clinical observations, patient history, and epidemiological information. The expected result is Negative. Fact Sheet for Patients: SugarRoll.be Fact Sheet for Healthcare Providers: https://www.woods-mathews.com/ This test is not yet approved or cleared by the [place] FDA and  has been authorized for detection and/or diagnosis of SARS-CoV-2 by FDA under an Emergency Use Authorization (EUA). This EUA will remain  in effect (meaning this test can be used) for the duration of the COVID-19 declaration under Section 56 4(b)(1) of the Act, 21 U.S.C. section 360bbb-3(b)(1), unless the authorization is terminated or revoked sooner. Performed at [facility], [place] [address] [place], [place] [address]   Respiratory Panel by PCR     Status: None   Collection Time: 12/30/18 12:17 AM   Specimen: Nasopharyngeal Swab; Respiratory  Result Value Ref Range Status   Adenovirus NOT DETECTED NOT DETECTED Final   Coronavirus 229E NOT DETECTED NOT DETECTED Final    Comment: (NOTE) The Coronavirus on the Respiratory Panel, DOES NOT test for the novel  Coronavirus (2019 nCoV)    Coronavirus HKU1 NOT DETECTED NOT DETECTED Final   Coronavirus NL63 NOT DETECTED NOT DETECTED Final   Coronavirus OC43 NOT DETECTED NOT DETECTED Final   Metapneumovirus NOT DETECTED NOT DETECTED Final   Rhinovirus / Enterovirus NOT DETECTED NOT DETECTED Final   Influenza A NOT DETECTED NOT  DETECTED Final   Influenza B NOT DETECTED NOT DETECTED Final   Parainfluenza Virus 1 NOT DETECTED NOT DETECTED Final   Parainfluenza Virus 2 NOT DETECTED NOT DETECTED Final   Parainfluenza Virus 3 NOT DETECTED NOT DETECTED Final   Parainfluenza Virus 4 NOT DETECTED NOT DETECTED Final   Respiratory Syncytial Virus NOT DETECTED NOT DETECTED Final   Bordetella pertussis NOT DETECTED NOT DETECTED Final   Chlamydophila pneumoniae NOT DETECTED NOT DETECTED Final   Mycoplasma pneumoniae NOT DETECTED NOT DETECTED Final    Comment: Performed at [facility], [place]. [address], [place], [place] [address]  Culture, blood (single) w Reflex to ID Panel     Status: None (Preliminary result)   Collection Time: 12/30/18  7:11 PM   Specimen: BLOOD  Result Value Ref Range Status   Specimen Description BLOOD Blood Culture adequate volume  Final   Special Requests BOTTLES DRAWN AEROBIC ONLY BLOOD LEFT FOREARM  Final   Culture   Final    NO GROWTH 3 DAYS Performed at [facility], [address] [place], [place] [address]    Report Status PENDING  Incomplete  MRSA PCR Screening     Status: None   Collection Time: 12/31/18  8:43 PM   Specimen: Nasopharyngeal  Result Value Ref Range Status   MRSA by PCR NEGATIVE NEGATIVE Final    Comment:  The GeneXpert MRSA Assay (FDA approved for NASAL specimens only), is one component of a comprehensive MRSA colonization surveillance program. It is not intended to diagnose MRSA infection nor to guide or monitor treatment for MRSA infections. Performed at [facility], [address], [place], [place] [address]     Radiology Reports Ct Head Wo Contrast  Result Date: 12/31/2018 CLINICAL DATA:  Seizure EXAM: CT HEAD WITHOUT CONTRAST TECHNIQUE: Contiguous axial images were obtained from the base of the skull through the vertex without intravenous contrast. COMPARISON:  None. FINDINGS: Brain: There is no acute intracranial  hemorrhage, mass-effect, or edema. There is no new loss of gray-white differentiation. Confluent areas of hypoattenuation in the supratentorial white matter are nonspecific but likely reflect stable advanced microvascular ischemic changes. Chronic small infarcts of the left cerebellum and thalamus again noted. There is no extra-axial collection. Ventricles are stable in size. Vascular: No hyperdense vessel. Skull: Calvarium is unremarkable. Sinuses/Orbits: Mild paranasal sinus mucosal thickening. Orbits are unremarkable. Other: Mastoid air cells are clear. IMPRESSION: No acute or new findings since December 29, 2018. Stable chronic findings detailed above. Electronically Signed   By: Macy Mis M.D.   On: 12/31/2018 13:15   Ct Head Wo Contrast  Result Date: 12/29/2018 CLINICAL DATA:  Loss of consciousness EXAM: CT HEAD WITHOUT CONTRAST TECHNIQUE: Contiguous axial images were obtained from the base of the skull through the vertex without intravenous contrast. COMPARISON:  None. FINDINGS: Brain: No evidence of acute territorial infarction, hemorrhage, hydrocephalus,extra-axial collection or mass lesion/mass effect. The ventricles are normal in size and contour. Low-attenuation changes in the deep white matter consistent with small vessel ischemia. Vascular: No hyperdense vessel or unexpected calcification. Skull: The skull is intact. No fracture or focal lesion identified. Sinuses/Orbits: The visualized paranasal sinuses and mastoid air cells are clear. The orbits and globes intact. Other: None IMPRESSION: No acute intracranial abnormality. Findings consistent with chronic small vessel ischemia Electronically Signed   By: Prudencio Pair M.D.   On: 12/29/2018 23:08   Mr Jeri Cos [id number] Contrast  Result Date: 01/01/2019 CLINICAL DATA:  Encephalopathy.  DKA. EXAM: MRI HEAD WITHOUT AND WITH CONTRAST TECHNIQUE: Multiplanar, multiecho pulse sequences of the brain and surrounding structures were obtained without and  with intravenous contrast. CONTRAST:  27mL GADAVIST GADOBUTROL 1 MMOL/ML IV SOLN COMPARISON:  CT head 12/31/2018 FINDINGS: Brain: Small acute infarct left inferior cerebellum. No other acute infarct Chronic infarct left inferior cerebellum. Extensive white matter disease bilaterally. Diffuse subcortical and deep white matter hyperintensity throughout both cerebral hemispheres. Hyperintensity is also noted in the right thalamus. Negative for hemorrhage or mass. Ventricle size normal. Normal enhancement postcontrast administration Vascular: Normal arterial flow voids Skull and upper cervical spine: No focal bony abnormality. Cervical spine fusion hardware. Sinuses/Orbits: Negative Other: None IMPRESSION: 1. Acute small infarct left medial cerebellum. 2. Extensive disease throughout the white matter most compatible with chronic ischemia. Chronic infarct left cerebellum. Electronically Signed   By: Franchot Gallo M.D.   On: 01/01/2019 15:05   Dg Chest Port 1 View  Result Date: 12/31/2018 CLINICAL DATA:  Sepsis EXAM: PORTABLE CHEST 1 VIEW COMPARISON:  December 29, 2018 which is FINDINGS: The heart size and mediastinal contours are unchanged. Again noted is prominence to the central pulmonary vasculature. There is mildly increased interstitial markings seen at both lower lungs slightly more pronounced than the prior exam. No pleural effusion. No acute osseous abnormality. IMPRESSION: Pulmonary vascular congestion and mildly increased interstitial markings at both lower lungs, slightly more pronounced  than the prior exam. This could be due to interstitial edema and/or early infectious etiology. Electronically Signed   By: Prudencio Pair M.D.   On: 12/31/2018 03:38   Dg Chest Port 1 View  Result Date: 12/29/2018 CLINICAL DATA:  Altered mental status EXAM: PORTABLE CHEST 1 VIEW COMPARISON:  November 12, 2016 FINDINGS: The heart size and mediastinal contours are within normal limits. There is prominence to the central  pulmonary vasculature. No large airspace consolidation or pleural effusion. IMPRESSION: Findings suggestive pulmonary vascular congestion. Electronically Signed   By: Prudencio Pair M.D.   On: 12/29/2018 21:16    Time Spent in minutes 25   Winton Offord M.D on 01/02/2019 at 2:09 PM  Between 7am to 7pm - Pager - [phone]  After 7pm go to [email or web address] - password [facility]  [facility] -  Office  [phone]

## 2019-01-03 DIAGNOSIS — J181 Lobar pneumonia, unspecified organism: Secondary | ICD-10-CM | POA: Diagnosis present

## 2019-01-03 DIAGNOSIS — I639 Cerebral infarction, unspecified: Secondary | ICD-10-CM | POA: Diagnosis present

## 2019-01-03 DIAGNOSIS — I10 Essential (primary) hypertension: Secondary | ICD-10-CM | POA: Diagnosis present

## 2019-01-03 LAB — BASIC METABOLIC PANEL
Anion gap: 10 (ref 5–15)
BUN: 7 mg/dL — ABNORMAL LOW (ref 8–23)
CO2: 23 mmol/L (ref 22–32)
Calcium: 8.8 mg/dL — ABNORMAL LOW (ref 8.9–10.3)
Chloride: 107 mmol/L (ref 98–111)
Creatinine, Ser: 1.01 mg/dL — ABNORMAL HIGH (ref 0.44–1.00)
GFR calc Af Amer: >60 mL/min (ref >60)
GFR calc non Af Amer: 57 mL/min — ABNORMAL LOW (ref >60)
Glucose, Bld: 115 mg/dL — ABNORMAL HIGH (ref 70–99)
Potassium: 3.6 mmol/L (ref 3.5–5.1)
Sodium: 140 mmol/L (ref 135–145)

## 2019-01-03 LAB — CBC
HCT: 29.5 % — ABNORMAL LOW (ref 36.0–46.0)
Hemoglobin: 9.5 g/dL — ABNORMAL LOW (ref 12.0–15.0)
MCH: 23.3 pg — ABNORMAL LOW (ref 26.0–34.0)
MCHC: 32.2 g/dL (ref 30.0–36.0)
MCV: 72.5 fL — ABNORMAL LOW (ref 80.0–100.0)
Platelets: 223 10*3/uL (ref 150–400)
RBC: 4.07 MIL/uL (ref 3.87–5.11)
RDW: 13.3 % (ref 11.5–15.5)
WBC: 13 10*3/uL — ABNORMAL HIGH (ref 4.0–10.5)
nRBC: 0 % (ref 0.0–0.2)

## 2019-01-03 LAB — GLUCOSE, CAPILLARY
Glucose-Capillary: 113 mg/dL — ABNORMAL HIGH (ref 70–99)
Glucose-Capillary: 115 mg/dL — ABNORMAL HIGH (ref 70–99)
Glucose-Capillary: 140 mg/dL — ABNORMAL HIGH (ref 70–99)
Glucose-Capillary: 167 mg/dL — ABNORMAL HIGH (ref 70–99)

## 2019-01-03 LAB — CULTURE, BLOOD (ROUTINE X 2)
Culture: NO GROWTH
Special Requests: ADEQUATE

## 2019-01-03 MED ORDER — LISINOPRIL 10 MG PO TABS: 20.0000 mg | ORAL_TABLET | Freq: Every day | ORAL | 0 refills | Status: DC

## 2019-01-03 MED ORDER — LANTUS SOLOSTAR 100 UNIT/ML ~~LOC~~ SOPN: 20.0000 [IU] | PEN_INJECTOR | Freq: Every day | SUBCUTANEOUS | 11 refills | Status: DC

## 2019-01-03 MED ORDER — ALBUTEROL SULFATE (2.5 MG/3ML) 0.083% IN NEBU: 2.5000 mg | INHALATION_SOLUTION | Freq: Four times a day (QID) | RESPIRATORY_TRACT | Status: DC | PRN

## 2019-01-03 MED ORDER — AMLODIPINE BESYLATE 5 MG PO TABS: 5.0000 mg | ORAL_TABLET | Freq: Every day | ORAL | 0 refills | Status: DC

## 2019-01-03 NOTE — Progress Notes (Signed)
Audrey Chan  A and O x 4 VSS. Pt tolerating diet well. No complaints of pain or nausea. IV removed intact, prescriptions given. Pt voices understanding of discharge instructions with no further questions. Pt discharged via wheelchair with axillary.   Allergies as of 01/03/2019   No Known Allergies     Medication List    STOP taking these medications   Apidra 100 UNIT/ML injection Generic drug: insulin glulisine   diclofenac sodium 1 % Gel Commonly known as: VOLTAREN   IBU 800 MG tablet Generic drug: ibuprofen   methocarbamol 500 MG tablet Commonly known as: ROBAXIN   mupirocin ointment 2 % Commonly known as: Bactroban   traMADol 50 MG tablet Commonly known as: ULTRAM     TAKE these medications   acetaminophen 500 MG tablet Commonly known as: TYLENOL Take 500-1,000 mg by mouth every 6 (six) hours as needed for moderate pain.   albuterol 108 (90 Base) MCG/ACT inhaler Commonly known as: VENTOLIN HFA Inhale 2 puffs into the lungs every 6 (six) hours as needed for wheezing or shortness of breath.   amLODipine 5 MG tablet Commonly known as: NORVASC Take 1 tablet (5 mg total) by mouth daily. Start taking on: January 04, 2019   Anoro Ellipta 62.5-25 MCG/INH Aepb Generic drug: umeclidinium-vilanterol Inhale 1 puff into the lungs daily.   aspirin EC 81 MG tablet Take 81 mg by mouth daily.   cetirizine 10 MG tablet Commonly known as: ZYRTEC Take 10 mg by mouth daily.   clopidogrel 75 MG tablet Commonly known as: PLAVIX Take 75 mg by mouth daily.   Crestor 20 MG tablet Generic drug: rosuvastatin Take 20 mg by mouth daily.   ferrous sulfate 325 (65 FE) MG tablet Take 325 mg by mouth 2 (two) times daily with a meal.   fluticasone 50 MCG/ACT nasal spray Commonly known as: FLONASE Place 2 sprays into both nostrils daily as needed for allergies or rhinitis.   gabapentin 800 MG tablet Commonly known as: NEURONTIN Take 800 mg by mouth 3 (three) times daily.   hydrochlorothiazide 25 MG tablet Commonly known as: HYDRODIURIL Take 25 mg by mouth daily.   Lantus SoloStar 100 UNIT/ML Solostar Pen Generic drug: Insulin Glargine Inject 20 Units into the skin at bedtime. What changed: how much to take   lisinopril 10 MG tablet Commonly known as: ZESTRIL Take 2 tablets (20 mg total) by mouth daily. What changed: how much to take   metFORMIN 500 MG 24 hr tablet Commonly known as: GLUCOPHAGE-XR Take 1,000 mg by mouth daily.   mometasone 0.1 % cream Commonly known as: ELOCON Apply 1 application topically 2 (two) times daily as needed (rash).   Multi-Vitamins Tabs Take 1 tablet by mouth daily.   polyethylene glycol powder 17 GM/SCOOP powder Commonly known as: GLYCOLAX/MIRALAX Take 0.5 Containers by mouth daily as needed for moderate constipation.   Vitamin D3 25 MCG (1000 UT) Caps Take 1,000 Units by mouth daily.       Vitals:   01/03/19 0416 01/03/19 1022  BP: 138/63 140/73  Pulse: 94 100  Resp: 17   Temp: 98.9 F (37.2 C) 98.5 F (36.9 C)  SpO2: 100%     Audrey Chan Audrey Chan

## 2019-01-03 NOTE — Care Management Important Message (Signed)
Important Message  Patient Details  Name: Audrey Chan MRN: [id number] Date of Birth: Sep 01, 1950   Medicare Important Message Given:  No  Patient discharged prior to arrival to unit to deliver concurrent Medicare IM.   Dannette Barbara 01/03/2019, 2:13 PM

## 2019-01-03 NOTE — Progress Notes (Signed)
Inpatient Diabetes Program Recommendations  AACE/ADA: New Consensus Statement on Inpatient Glycemic Control (2015)  Target Ranges:  Prepandial:   less than 140 mg/dL      Peak postprandial:   less than 180 mg/dL (1-2 hours)      Critically ill patients:  140 - 180 mg/dL   Results for Audrey Chan, Audrey Chan (MRN [id number]) as of 01/03/2019 08:40  Ref. Range 01/01/2019 23:40 01/02/2019 03:23 01/02/2019 07:17 01/02/2019 11:27 01/02/2019 17:09 01/02/2019 20:30  Glucose-Capillary Latest Ref Range: 70 - 99 mg/dL 154 (H)  3 units NOVOLOG  142 (H)  2 units NOVOLOG  131 (H)  2 units NOVOLOG   276 (H)  8 units NOVOLOG  193 (H)  3 units NOVOLOG  200 (H)  3 units NOVOLOG +  15 units LANTUS    Results for [name], [name] (MRN [id number]) as of 01/03/2019 08:40  Ref. Range 01/03/2019 00:07 01/03/2019 04:15 01/03/2019 07:39  Glucose-Capillary Latest Ref Range: 70 - 99 mg/dL 115 (H) 113 (H) 140 (H)     Home DM Meds: Lantus 14 units QHS (see below)                             Apidra TID per home scale (see below)                             Metformin XR 500 mg BID   Current Orders: Lantus 15 units QPM      Novolog Moderate Correction Scale/ SSI (0-15 units) Q4 hours   Endocrinologist: Dr. Evalee Mutton with [facility]--Last seen 11/08/2018  Pt's Insulin Pump was stopped by her ENDO on 10/11/2018 as she was not effectively using the pump.  Was switched to Lantus and Apidra Insulins back in August.   Was instructed to take the following at that visit in Sept: Lantus 14 units QHS Apidra Insulin in the following regimen: Take 5 units of Apidra if your sugar is 80 - 150 Take 6 units of Apidra if your sugar is 150-210 Take 7 units of Apidra if your sugar is 211-270 Take 7 units of Apidra if your sugar is 271-330 Take 8 units of Apidra if your sugar is 331-390 Take 9 units of Apidra if your sugar is over 391     MD- Please consider the following in-hospital insulin  adjustments:  1. Change Novolog SSI timing to TID AC + HS (currently ordered Q4 hours)  2. Start Novolog Meal Coverage: Novolog 4 units TID with meals  (Please add the following Hold Parameters: Hold if pt eats <50% of meal, Hold if pt NPO)     --Will follow patient during hospitalization--  Wyn Quaker RN, MSN, CDE Diabetes Coordinator Inpatient Glycemic Control Team Team Pager: [phone] (8a-5p)

## 2019-01-03 NOTE — TOC Transition Note (Signed)
Transition of Care [facility]) - CM/SW Discharge Note   Patient Details  Name: REBACCA RUMERY MRN: [id number] Date of Birth: 21-Jan-1951  Transition of [facility]) CM/SW Contact:  Latanya Maudlin, RN Phone Number: 01/03/2019, 11:41 AM   Clinical Narrative:   Patient to be discharged per MD order. Orders in place for home health services. CMS Medicare.gov Compare Post Acute Care list reviewed with patient and she has no preference of agency. Referral to [place] at [facility]. Patient reports no DME needs. Her sister is planning on staying with her. Family to transport.     Final next level of care: Home w Home Health Services Barriers to Discharge: No Barriers Identified   Patient Goals and CMS Choice   CMS Medicare.gov Compare Post Acute Care list provided to:: Patient Choice offered to / list presented to : Patient  Discharge Placement                       Discharge Plan and Services                          HH Arranged: RN, PT, OT [facility]: [facility] [place] Date [facility] Agency Contacted: 01/03/19 Time [place]: [id number] Representative spoke with at [place]: [place] ([place]) Interventions     Readmission Risk Interventions Readmission Risk Prevention Plan 01/03/2019  Transportation Screening Complete  PCP or Specialist Appt within 5-7 Days Complete  Home Care Screening Complete  Medication Review (RN CM) Complete  Some recent data might be hidden

## 2019-01-03 NOTE — Discharge Instructions (Signed)
Diabetes Mellitus and Nutrition, Adult When you have diabetes (diabetes mellitus), it is very important to have healthy eating habits because your blood sugar (glucose) levels are greatly affected by what you eat and drink. Eating healthy foods in the appropriate amounts, at about the same times every day, can help you:  Control your blood glucose.  Lower your risk of heart disease.  Improve your blood pressure.  Reach or maintain a healthy weight. Every person with diabetes is different, and each person has different needs for a meal plan. Your health care provider may recommend that you work with a diet and nutrition specialist (dietitian) to make a meal plan that is best for you. Your meal plan may vary depending on factors such as:  The calories you need.  The medicines you take.  Your weight.  Your blood glucose, blood pressure, and cholesterol levels.  Your activity level.  Other health conditions you have, such as heart or kidney disease. How do carbohydrates affect me? Carbohydrates, also called carbs, affect your blood glucose level more than any other type of food. Eating carbs naturally raises the amount of glucose in your blood. Carb counting is a method for keeping track of how many carbs you eat. Counting carbs is important to keep your blood glucose at a healthy level, especially if you use insulin or take certain oral diabetes medicines. It is important to know how many carbs you can safely have in each meal. This is different for every person. Your dietitian can help you calculate how many carbs you should have at each meal and for each snack. Foods that contain carbs include:  Bread, cereal, rice, pasta, and crackers.  Potatoes and corn.  Peas, beans, and lentils.  Milk and yogurt.  Fruit and juice.  Desserts, such as cakes, cookies, ice cream, and candy. How does alcohol affect me? Alcohol can cause a sudden decrease in blood glucose (hypoglycemia),  especially if you use insulin or take certain oral diabetes medicines. Hypoglycemia can be a life-threatening condition. Symptoms of hypoglycemia (sleepiness, dizziness, and confusion) are similar to symptoms of having too much alcohol. If your health care provider says that alcohol is safe for you, follow these guidelines:  Limit alcohol intake to no more than 1 drink per day for nonpregnant women and 2 drinks per day for men. One drink equals 12 oz of beer, 5 oz of wine, or 1 oz of hard liquor.  Do not drink on an empty stomach.  Keep yourself hydrated with water, diet soda, or unsweetened iced tea.  Keep in mind that regular soda, juice, and other mixers may contain a lot of sugar and must be counted as carbs. What are tips for following this plan?  Reading food labels  Start by checking the serving size on the "Nutrition Facts" label of packaged foods and drinks. The amount of calories, carbs, fats, and other nutrients listed on the label is based on one serving of the item. Many items contain more than one serving per package.  Check the total grams (g) of carbs in one serving. You can calculate the number of servings of carbs in one serving by dividing the total carbs by 15. For example, if a food has 30 g of total carbs, it would be equal to 2 servings of carbs.  Check the number of grams (g) of saturated and trans fats in one serving. Choose foods that have low or no amount of these fats.  Check the number of  milligrams (mg) of salt (sodium) in one serving. Most people should limit total sodium intake to less than 2,300 mg per day.  Always check the nutrition information of foods labeled as "low-fat" or "nonfat". These foods may be higher in added sugar or refined carbs and should be avoided.  Talk to your dietitian to identify your daily goals for nutrients listed on the label. Shopping  Avoid buying canned, premade, or processed foods. These foods tend to be high in fat, sodium,  and added sugar.  Shop around the outside edge of the grocery store. This includes fresh fruits and vegetables, bulk grains, fresh meats, and fresh dairy. Cooking  Use low-heat cooking methods, such as baking, instead of high-heat cooking methods like deep frying.  Cook using healthy oils, such as olive, canola, or sunflower oil.  Avoid cooking with butter, cream, or high-fat meats. Meal planning  Eat meals and snacks regularly, preferably at the same times every day. Avoid going long periods of time without eating.  Eat foods high in fiber, such as fresh fruits, vegetables, beans, and whole grains. Talk to your dietitian about how many servings of carbs you can eat at each meal.  Eat 4-6 ounces (oz) of lean protein each day, such as lean meat, chicken, fish, eggs, or tofu. One oz of lean protein is equal to: ? 1 oz of meat, chicken, or fish. ? 1 egg. ?  cup of tofu.  Eat some foods each day that contain healthy fats, such as avocado, nuts, seeds, and fish. Lifestyle  Check your blood glucose regularly.  Exercise regularly as told by your health care provider. This may include: ? 150 minutes of moderate-intensity or vigorous-intensity exercise each week. This could be brisk walking, biking, or water aerobics. ? Stretching and doing strength exercises, such as yoga or weightlifting, at least 2 times a week.  Take medicines as told by your health care provider.  Do not use any products that contain nicotine or tobacco, such as cigarettes and e-cigarettes. If you need help quitting, ask your health care provider.  Work with a Social worker or diabetes educator to identify strategies to manage stress and any emotional and social challenges. Questions to ask a health care provider  Do I need to meet with a diabetes educator?  Do I need to meet with a dietitian?  What number can I call if I have questions?  When are the best times to check my blood glucose? Where to find more  information:  American Diabetes Association: diabetes.org  Academy of Nutrition and Dietetics: www.eatright.[employer] of Diabetes and Digestive and Kidney Diseases (NIH): [employer] Summary  A healthy meal plan will help you control your blood glucose and maintain a healthy lifestyle.  Working with a diet and nutrition specialist (dietitian) can help you make a meal plan that is best for you.  Keep in mind that carbohydrates (carbs) and alcohol have immediate effects on your blood glucose levels. It is important to count carbs and to use alcohol carefully. This information is not intended to replace advice given to you by your health care provider. Make sure you discuss any questions you have with your health care provider. Document Released: 11/03/2004 Document Revised: 01/19/2017 Document Reviewed: 03/13/2016 Elsevier Patient Education  2020 [employer].    Stroke Prevention Some medical conditions and lifestyle choices can lead to a higher risk for a stroke. You can help to prevent a stroke by making nutrition, lifestyle, and other changes. What nutrition  changes can be made?   Eat healthy foods. ? Choose foods that are high in fiber. These include:  Fresh fruits.  Fresh vegetables.  Whole grains. ? Eat at least 5 or more servings of fruits and vegetables each day. Try to fill half of your plate at each meal with fruits and vegetables. ? Choose lean protein foods. These include:  Lowfat (lean) cuts of meat.  Chicken without skin.  Fish.  Tofu.  Beans.  Nuts. ? Eat low-fat dairy products. ? Avoid foods that:  Are high in salt (sodium).  Have saturated fat.  Have trans fat.  Have cholesterol.  Are processed.  Are premade.  Follow eating guidelines as told by your doctor. These may include: ? Reducing how many calories you eat and drink each day. ? Limiting how much salt you eat or drink each day to 1,500 milligrams (mg). ? Using  only healthy fats for cooking. These include:  Olive oil.  Canola oil.  Sunflower oil. ? Counting how many carbohydrates you eat and drink each day. What lifestyle changes can be made?  Try to stay at a healthy weight. Talk to your doctor about what a good weight is for you.  Get at least 30 minutes of moderate physical activity at least 5 days a week. This can include: ? Fast walking. ? Biking. ? Swimming.  Do not use any products that have nicotine or tobacco. This includes cigarettes and e-cigarettes. If you need help quitting, ask your doctor. Avoid being around tobacco smoke in general.  Limit how much alcohol you drink to no more than 1 drink a day for nonpregnant women and 2 drinks a day for men. One drink equals 12 oz of beer, 5 oz of wine, or 1 oz of hard liquor.  Do not use drugs.  Avoid taking birth control pills. Talk to your doctor about the risks of taking birth control pills if: ? You are over 5 years old. ? You smoke. ? You get migraines. ? You have had a blood clot. What other changes can be made?  Manage your cholesterol. ? It is important to eat a healthy diet. ? If your cholesterol cannot be managed through your diet, you may also need to take medicines. Take medicines as told by your doctor.  Manage your diabetes. ? It is important to eat a healthy diet and to exercise regularly. ? If your blood sugar cannot be managed through diet and exercise, you may need to take medicines. Take medicines as told by your doctor.  Control your high blood pressure (hypertension). ? Try to keep your blood pressure below 130/80. This can help lower your risk of stroke. ? It is important to eat a healthy diet and to exercise regularly. ? If your blood pressure cannot be managed through diet and exercise, you may need to take medicines. Take medicines as told by your doctor. ? Ask your doctor if you should check your blood pressure at home. ? Have your blood pressure  checked every year. Do this even if your blood pressure is normal.  Talk to your doctor about getting checked for a sleep disorder. Signs of this can include: ? Snoring a lot. ? Feeling very tired.  Take over-the-counter and prescription medicines only as told by your doctor. These may include aspirin or blood thinners (antiplatelets or anticoagulants).  Make sure that any other medical conditions you have are managed. Where to find more information  American Stroke Association: www.strokeassociation.org  National  Stroke Association: www.stroke.org Get help right away if:  You have any symptoms of stroke. "BE FAST" is an easy way to remember the main warning signs: ? B - Balance. Signs are dizziness, sudden trouble walking, or loss of balance. ? E - Eyes. Signs are trouble seeing or a sudden change in how you see. ? F - Face. Signs are sudden weakness or loss of feeling of the face, or the face or eyelid drooping on one side. ? A - Arms. Signs are weakness or loss of feeling in an arm. This happens suddenly and usually on one side of the body. ? S - Speech. Signs are sudden trouble speaking, slurred speech, or trouble understanding what people say. ? T - Time. Time to call emergency services. Write down what time symptoms started.  You have other signs of stroke, such as: ? A sudden, very bad headache with no known cause. ? Feeling sick to your stomach (nausea). ? Throwing up (vomiting). ? Jerky movements you cannot control (seizure). These symptoms may represent a serious problem that is an emergency. Do not wait to see if the symptoms will go away. Get medical help right away. Call your local emergency services (911 in the [place] Do not drive yourself to the hospital. Summary  You can prevent a stroke by eating healthy, exercising, not smoking, drinking less alcohol, and treating other health problems, such as diabetes, high blood pressure, or high cholesterol.  Do not use any  products that contain nicotine or tobacco, such as cigarettes and e-cigarettes.  Get help right away if you have any signs or symptoms of a stroke. This information is not intended to replace advice given to you by your health care provider. Make sure you discuss any questions you have with your health care provider. Document Released: 08/08/2011 Document Revised: 04/04/2018 Document Reviewed: 05/10/2016 Elsevier Patient Education  2020 [employer].

## 2019-01-03 NOTE — Discharge Summary (Signed)
Physician Discharge Summary  Audrey Chan [id number] DOB: 1950/03/24 DOA: 12/29/2018  PCP: Theotis Burrow, MD  Admit date: 12/29/2018 Discharge date: 01/03/2019  Admitted From: Home Disposition: Home  Recommendations for Outpatient Follow-up:  Discharge home with outpatient PCP follow-up in 1 week. Referral made to outpatient neurology (stroke service in [place]).  Home Health: RN, PT and OT Equipment/Devices: None  Discharge Condition: Fair CODE STATUS: Full code Diet recommendation: Heart Healthy / Carb Modified   Discharge Diagnoses:  Principal Problem:   Acute toxic metabolic encephalopathy  Active Problems:   Sepsis with acute renal failure without septic shock (HCC)   DKA (diabetic ketoacidoses) (HCC)   AKI (acute kidney injury) ([place])   Hyponatremia   Hyperkalemia   Uncontrolled hypertension   Lobar pneumonia (HCC)   Cerebellar stroke, acute ([place])  Brief narrative/HPI 68 year old female with history of diabetes mellitus, stroke and hypertension presented with acute change in mental status.  Her boyfriend found her confused on the morning of admission, later during the day was found in bed covered in feces.  When EMS arrived she was found to be combative and febrile to 104 F and tachycardic to 140s.  She was found to be in DKA with anion gap of 22, lactic acid of 3.4 and glucose of 965.  WBC of 12.6K.  Chest x-ray showing bilateral infiltrates.  Head CT unremarkable. Placed on IV fluids, insulin drip.  LP attempted but failed.  Started on empiric IV antibiotics and admitted to stepdown unit.    Patient was tested negative for COVID-19.  Hospital course   Principle problem Acute toxic metabolic encephalopathy Suspect secondary to DKA and sepsis due to pneumonia.  EEG  was negative for epileptiform discharge.  Failed LP on admission and given significant improvement in her mental status to baseline, LP was not required.Marland Kitchen   Resume Neurontin upon  discharge.  I have discontinued her tramadol and Robaxin which she has not required while in the hospital.  Active problems Left cerebellar stroke, acute Small and incidental finding on MRI.  Already on dual antiplatelet therapy.  Continue statin.  PT recommends home health with supervision.  No residual weakness. Outpatient  stroke team follow-up.  (Referral made). Recent history of stroke 3 months back at [facility] without residual deficit.       Sepsis with acute renal failure without septic shock (HCC) Suspect due to lobar pneumonia.   Received 5 days of IV Rocephin.  MRI negative for encephalitis.  Empiric antibiotics and antiviral for suspected meningitis discontinued.  Sepsis resolved.    DKA (diabetic ketoacidoses) ([place]) Resolved with aggressive IV hydration and insulin drip. CBG currently stable on Lantus and sliding scale correction.    A1c of 9.7.  Was on insulin pump and follows with an endocrinologist.  Insulin pump was discontinued few months back.  Now on Lantus at home.  Dose increased to 20 units daily.  Metformin resumed.  Follow-up as outpatient.    AKI (acute kidney injury) ([place]) Likely prerenal with severe dehydration and sepsis.  Resolved with IV hydration.  Resume ACE inhibitor.  Uncontrolled hypertension Needs aggressive blood pressure control for secondary stroke prevention.  Lisinopril resumed and dose increased.  Resume home dose HCTZ and added low-dose amlodipine.   Lactic acidosis Secondary to sepsis.  Now resolved.    Resume metformin upon discharge.  COPD Resume home inhalers  Anemia Continue iron supplement.    Follow as outpatient.    Hypokalemia hypomagnesemia Replenished     Family  Communication  : None at bedside  Disposition Plan  :  Home with home health.   Consults  : Neurology  Procedures  : CT head, MRI brain, EEG   Discharge Instructions  Discharge Instructions    Ambulatory referral to Neurology    Complete by: As directed    An appointment is requested in approximately: 6 weeks     Allergies as of 01/03/2019   No Known Allergies     Medication List    STOP taking these medications   Apidra 100 UNIT/ML injection Generic drug: insulin glulisine   diclofenac sodium 1 % Gel Commonly known as: VOLTAREN   IBU 800 MG tablet Generic drug: ibuprofen   methocarbamol 500 MG tablet Commonly known as: ROBAXIN   mupirocin ointment 2 % Commonly known as: Bactroban   traMADol 50 MG tablet Commonly known as: ULTRAM     TAKE these medications   acetaminophen 500 MG tablet Commonly known as: TYLENOL Take 500-1,000 mg by mouth every 6 (six) hours as needed for moderate pain.   albuterol 108 (90 Base) MCG/ACT inhaler Commonly known as: VENTOLIN HFA Inhale 2 puffs into the lungs every 6 (six) hours as needed for wheezing or shortness of breath.   amLODipine 5 MG tablet Commonly known as: NORVASC Take 1 tablet (5 mg total) by mouth daily. Start taking on: January 04, 2019   Anoro Ellipta 62.5-25 MCG/INH Aepb Generic drug: umeclidinium-vilanterol Inhale 1 puff into the lungs daily.   aspirin EC 81 MG tablet Take 81 mg by mouth daily.   cetirizine 10 MG tablet Commonly known as: ZYRTEC Take 10 mg by mouth daily.   clopidogrel 75 MG tablet Commonly known as: PLAVIX Take 75 mg by mouth daily.   Crestor 20 MG tablet Generic drug: rosuvastatin Take 20 mg by mouth daily.   ferrous sulfate 325 (65 FE) MG tablet Take 325 mg by mouth 2 (two) times daily with a meal.   fluticasone 50 MCG/ACT nasal spray Commonly known as: FLONASE Place 2 sprays into both nostrils daily as needed for allergies or rhinitis.   gabapentin 800 MG tablet Commonly known as: NEURONTIN Take 800 mg by mouth 3 (three) times daily.   hydrochlorothiazide 25 MG tablet Commonly known as: HYDRODIURIL Take 25 mg by mouth daily.   Lantus SoloStar 100 UNIT/ML Solostar Pen Generic drug: Insulin  Glargine Inject 20 Units into the skin at bedtime. What changed: how much to take   lisinopril 10 MG tablet Commonly known as: ZESTRIL Take 2 tablets (20 mg total) by mouth daily. What changed: how much to take   metFORMIN 500 MG 24 hr tablet Commonly known as: GLUCOPHAGE-XR Take 1,000 mg by mouth daily.   mometasone 0.1 % cream Commonly known as: ELOCON Apply 1 application topically 2 (two) times daily as needed (rash).   Multi-Vitamins Tabs Take 1 tablet by mouth daily.   polyethylene glycol powder 17 GM/SCOOP powder Commonly known as: GLYCOLAX/MIRALAX Take 0.5 Containers by mouth daily as needed for moderate constipation.   Vitamin D3 25 MCG (1000 UT) Caps Take 1,000 Units by mouth daily.      Follow-up Information    Revelo, Elyse Jarvis, MD. Schedule an appointment as soon as possible for a visit in 1 week(s).   Specialty: Family Medicine Contact information: [address] [place] [address] [phone]        referred to outpatient neurology ( stroke service) in 6 weeks. office will call with appt Follow up.  No Known Allergies    Procedures/Studies: Ct Head Wo Contrast  Result Date: 12/31/2018 CLINICAL DATA:  Seizure EXAM: CT HEAD WITHOUT CONTRAST TECHNIQUE: Contiguous axial images were obtained from the base of the skull through the vertex without intravenous contrast. COMPARISON:  None. FINDINGS: Brain: There is no acute intracranial hemorrhage, mass-effect, or edema. There is no new loss of gray-white differentiation. Confluent areas of hypoattenuation in the supratentorial white matter are nonspecific but likely reflect stable advanced microvascular ischemic changes. Chronic small infarcts of the left cerebellum and thalamus again noted. There is no extra-axial collection. Ventricles are stable in size. Vascular: No hyperdense vessel. Skull: Calvarium is unremarkable. Sinuses/Orbits: Mild paranasal sinus mucosal thickening. Orbits  are unremarkable. Other: Mastoid air cells are clear. IMPRESSION: No acute or new findings since December 29, 2018. Stable chronic findings detailed above. Electronically Signed   By: Macy Mis M.D.   On: 12/31/2018 13:15   Ct Head Wo Contrast  Result Date: 12/29/2018 CLINICAL DATA:  Loss of consciousness EXAM: CT HEAD WITHOUT CONTRAST TECHNIQUE: Contiguous axial images were obtained from the base of the skull through the vertex without intravenous contrast. COMPARISON:  None. FINDINGS: Brain: No evidence of acute territorial infarction, hemorrhage, hydrocephalus,extra-axial collection or mass lesion/mass effect. The ventricles are normal in size and contour. Low-attenuation changes in the deep white matter consistent with small vessel ischemia. Vascular: No hyperdense vessel or unexpected calcification. Skull: The skull is intact. No fracture or focal lesion identified. Sinuses/Orbits: The visualized paranasal sinuses and mastoid air cells are clear. The orbits and globes intact. Other: None IMPRESSION: No acute intracranial abnormality. Findings consistent with chronic small vessel ischemia Electronically Signed   By: Prudencio Pair M.D.   On: 12/29/2018 23:08   Mr Jeri Cos [id number] Contrast  Result Date: 01/01/2019 CLINICAL DATA:  Encephalopathy.  DKA. EXAM: MRI HEAD WITHOUT AND WITH CONTRAST TECHNIQUE: Multiplanar, multiecho pulse sequences of the brain and surrounding structures were obtained without and with intravenous contrast. CONTRAST:  52mL GADAVIST GADOBUTROL 1 MMOL/ML IV SOLN COMPARISON:  CT head 12/31/2018 FINDINGS: Brain: Small acute infarct left inferior cerebellum. No other acute infarct Chronic infarct left inferior cerebellum. Extensive white matter disease bilaterally. Diffuse subcortical and deep white matter hyperintensity throughout both cerebral hemispheres. Hyperintensity is also noted in the right thalamus. Negative for hemorrhage or mass. Ventricle size normal. Normal enhancement  postcontrast administration Vascular: Normal arterial flow voids Skull and upper cervical spine: No focal bony abnormality. Cervical spine fusion hardware. Sinuses/Orbits: Negative Other: None IMPRESSION: 1. Acute small infarct left medial cerebellum. 2. Extensive disease throughout the white matter most compatible with chronic ischemia. Chronic infarct left cerebellum. Electronically Signed   By: Franchot Gallo M.D.   On: 01/01/2019 15:05   Dg Chest Port 1 View  Result Date: 12/31/2018 CLINICAL DATA:  Sepsis EXAM: PORTABLE CHEST 1 VIEW COMPARISON:  December 29, 2018 which is FINDINGS: The heart size and mediastinal contours are unchanged. Again noted is prominence to the central pulmonary vasculature. There is mildly increased interstitial markings seen at both lower lungs slightly more pronounced than the prior exam. No pleural effusion. No acute osseous abnormality. IMPRESSION: Pulmonary vascular congestion and mildly increased interstitial markings at both lower lungs, slightly more pronounced than the prior exam. This could be due to interstitial edema and/or early infectious etiology. Electronically Signed   By: Prudencio Pair M.D.   On: 12/31/2018 03:38   Dg Chest Port 1 View  Result Date: 12/29/2018 CLINICAL DATA:  Altered mental status EXAM: PORTABLE  CHEST 1 VIEW COMPARISON:  November 12, 2016 FINDINGS: The heart size and mediastinal contours are within normal limits. There is prominence to the central pulmonary vasculature. No large airspace consolidation or pleural effusion. IMPRESSION: Findings suggestive pulmonary vascular congestion. Electronically Signed   By: Prudencio Pair M.D.   On: 12/29/2018 21:16    Subjective: Continues to feel better.  No overnight events.  Discharge Exam: Vitals:   01/03/19 0416 01/03/19 1022  BP: 138/63 140/73  Pulse: 94 100  Resp: 17   Temp: 98.9 F (37.2 C) 98.5 F (36.9 C)  SpO2: 100%    Vitals:   01/02/19 1612 01/02/19 2020 01/03/19 0416 01/03/19  1022  BP: (!) 170/75 (!) 146/77 138/63 140/73  Pulse:  (!) 102 94 100  Resp:  17 17   Temp:  98.8 F (37.1 C) 98.9 F (37.2 C) 98.5 F (36.9 C)  TempSrc:  Oral Oral Oral  SpO2:  98% 100%   Weight:      Height:        General: Not in distress HEENT: Moist mucosa, supple neck Chest: Clear bilaterally CVs: Normal S1-S2, no murmurs GI: Soft, nondistended, nontender Musculoskeletal: Warm, no edema CNS: Alert and oriented, nonfocal    The results of significant diagnostics from this hospitalization (including imaging, microbiology, ancillary and laboratory) are listed below for reference.     Microbiology: Recent Results (from the past 240 hour(s))  Blood Culture (routine x 2)     Status: None   Collection Time: 12/29/18  9:15 PM   Specimen: BLOOD  Result Value Ref Range Status   Specimen Description BLOOD BLOOD RIGHT FOREARM  Final   Special Requests   Final    BOTTLES DRAWN AEROBIC AND ANAEROBIC Blood Culture adequate volume   Culture   Final    NO GROWTH 5 DAYS Performed at [facility], [address], [place], [place] [address]    Report Status 01/03/2019 FINAL  Final  Urine culture     Status: None   Collection Time: 12/29/18  9:15 PM   Specimen: In/Out Cath Urine  Result Value Ref Range Status   Specimen Description   Final    IN/OUT CATH URINE Performed at [facility], [address] [place], [place] [address]    Special Requests   Final    NONE Performed at [facility], [address] [place], [place] [address]    Culture   Final    NO GROWTH Performed at [facility], [place] [address] [place], [place] [address]    Report Status 12/31/2018 FINAL  Final  SARS CORONAVIRUS 2 (TAT 6-24 HRS) Nasopharyngeal Nasopharyngeal Swab     Status: None   Collection Time: 12/29/18  9:15 PM   Specimen: Nasopharyngeal Swab  Result Value Ref Range Status   SARS Coronavirus 2 NEGATIVE NEGATIVE Final    Comment: (NOTE) SARS-CoV-2  target nucleic acids are NOT DETECTED. The SARS-CoV-2 RNA is generally detectable in upper and lower respiratory specimens during the acute phase of infection. Negative results do not preclude SARS-CoV-2 infection, do not rule out co-infections with other pathogens, and should not be used as the sole basis for treatment or other patient management decisions. Negative results must be combined with clinical observations, patient history, and epidemiological information. The expected result is Negative. Fact Sheet for Patients: SugarRoll.be Fact Sheet for Healthcare Providers: https://www.woods-mathews.com/ This test is not yet approved or cleared by the [place] and  has been authorized  for detection and/or diagnosis of SARS-CoV-2 by FDA under an Emergency Use Authorization (EUA). This EUA will remain  in effect (meaning this test can be used) for the duration of the COVID-19 declaration under Section 56 4(b)(1) of the Act, 21 U.S.C. section 360bbb-3(b)(1), unless the authorization is terminated or revoked sooner. Performed at [facility], [place] [address] [place], [place] [address]   Respiratory Panel by PCR     Status: None   Collection Time: 12/30/18 12:17 AM   Specimen: Nasopharyngeal Swab; Respiratory  Result Value Ref Range Status   Adenovirus NOT DETECTED NOT DETECTED Final   Coronavirus 229E NOT DETECTED NOT DETECTED Final    Comment: (NOTE) The Coronavirus on the Respiratory Panel, DOES NOT test for the novel  Coronavirus (2019 nCoV)    Coronavirus HKU1 NOT DETECTED NOT DETECTED Final   Coronavirus NL63 NOT DETECTED NOT DETECTED Final   Coronavirus OC43 NOT DETECTED NOT DETECTED Final   Metapneumovirus NOT DETECTED NOT DETECTED Final   Rhinovirus / Enterovirus NOT DETECTED NOT DETECTED Final   Influenza A NOT DETECTED NOT DETECTED Final   Influenza B NOT DETECTED NOT DETECTED Final   Parainfluenza Virus 1 NOT  DETECTED NOT DETECTED Final   Parainfluenza Virus 2 NOT DETECTED NOT DETECTED Final   Parainfluenza Virus 3 NOT DETECTED NOT DETECTED Final   Parainfluenza Virus 4 NOT DETECTED NOT DETECTED Final   Respiratory Syncytial Virus NOT DETECTED NOT DETECTED Final   Bordetella pertussis NOT DETECTED NOT DETECTED Final   Chlamydophila pneumoniae NOT DETECTED NOT DETECTED Final   Mycoplasma pneumoniae NOT DETECTED NOT DETECTED Final    Comment: Performed at [facility], [place]. [address] [place], [place] [address]  Culture, blood (single) w Reflex to ID Panel     Status: None (Preliminary result)   Collection Time: 12/30/18  7:11 PM   Specimen: BLOOD  Result Value Ref Range Status   Specimen Description BLOOD Blood Culture adequate volume  Final   Special Requests BOTTLES DRAWN AEROBIC ONLY BLOOD LEFT FOREARM  Final   Culture   Final    NO GROWTH 4 DAYS Performed at [facility], [address], [place], [place] [address]    Report Status PENDING  Incomplete  MRSA PCR Screening     Status: None   Collection Time: 12/31/18  8:43 PM   Specimen: Nasopharyngeal  Result Value Ref Range Status   MRSA by PCR NEGATIVE NEGATIVE Final    Comment:        The GeneXpert MRSA Assay (FDA approved for NASAL specimens only), is one component of a comprehensive MRSA colonization surveillance program. It is not intended to diagnose MRSA infection nor to guide or monitor treatment for MRSA infections. Performed at [facility], [address] [place], [place] [address]      Labs: BNP (last 3 results) No results for input(s): BNP in the last 8760 hours. Basic Metabolic Panel: Recent Labs  Lab 12/30/18 0830 12/30/18 1205 12/31/18 0444 01/01/19 0452 01/01/19 0457 01/02/19 0613 01/03/19 0336  NA 138 139 139 141  --  140 140  K 3.4* 3.5 3.1* 3.0*  --  3.3* 3.6  CL 107 113* 111 111  --  109 107  CO2 19* 20* 19* 17*  --  21* 23  GLUCOSE 187* 137* 186* 198*  --  105* 115*   BUN 25* 21 17 10   --  6* 7*  CREATININE 1.66* 1.54* 1.27* 1.08*  --  0.92  1.01*  CALCIUM 8.7* 8.5* 8.2* 8.3*  --  8.5* 8.8*  MG 1.5*  --   --   --  1.2* 2.1  --   PHOS 1.6*  --   --   --  2.9  --   --    Liver Function Tests: Recent Labs  Lab 12/29/18 2115 12/31/18 0444  AST 25 81*  ALT 44 45*  ALKPHOS 329* 203*  BILITOT 1.1 0.5  PROT 8.6* 6.4*  ALBUMIN 4.2 3.0*   Recent Labs  Lab 12/29/18 2115  LIPASE 13   Recent Labs  Lab 01/01/19 0457  AMMONIA 20   CBC: Recent Labs  Lab 12/29/18 2115 12/30/18 0427 12/30/18 1230 12/31/18 0444 01/01/19 0452 01/03/19 0336  WBC 12.6* 19.4* 20.5* 18.0* 14.8* 13.0*  NEUTROABS 11.1*  --   --  13.0*  --   --   HGB 11.4* 10.8* 10.3* 9.7* 9.2* 9.5*  HCT 35.2* 32.6* 30.6* 29.8* 26.5* 29.5*  MCV 73.5* 71.0* 70.3* 72.3* 67.8* 72.5*  PLT 362 302 284 237 212 223   Cardiac Enzymes: No results for input(s): CKTOTAL, CKMB, CKMBINDEX, TROPONINI in the last 168 hours. BNP: Invalid input(s): POCBNP CBG: Recent Labs  Lab 01/02/19 1709 01/02/19 2030 01/03/19 0007 01/03/19 0415 01/03/19 0739  GLUCAP 193* 200* 115* 113* 140*   D-Dimer No results for input(s): DDIMER in the last 72 hours. Hgb A1c Recent Labs    01/01/19 0452  HGBA1C 9.7*   Lipid Profile No results for input(s): CHOL, HDL, LDLCALC, TRIG, CHOLHDL, LDLDIRECT in the last 72 hours. Thyroid function studies No results for input(s): TSH, T4TOTAL, T3FREE, THYROIDAB in the last 72 hours.  Invalid input(s): FREET3 Anemia work up No results for input(s): VITAMINB12, FOLATE, FERRITIN, TIBC, IRON, RETICCTPCT in the last 72 hours. Urinalysis    Component Value Date/Time   COLORURINE YELLOW (A) 12/29/2018 2115   APPEARANCEUR HAZY (A) 12/29/2018 2115   APPEARANCEUR CLEAR 06/07/2014 1233   LABSPEC 1.020 12/29/2018 2115   LABSPEC 1.010 06/07/2014 1233   PHURINE 5.0 12/29/2018 2115   GLUCOSEU >=500 (A) 12/29/2018 2115   GLUCOSEU 500 mg/dL 06/07/2014 1233   HGBUR SMALL (A)  12/29/2018 2115   BILIRUBINUR NEGATIVE 12/29/2018 2115   BILIRUBINUR NEGATIVE 06/07/2014 1233   KETONESUR 20 (A) 12/29/2018 2115   PROTEINUR 30 (A) 12/29/2018 2115   NITRITE NEGATIVE 12/29/2018 2115   LEUKOCYTESUR NEGATIVE 12/29/2018 2115   LEUKOCYTESUR NEGATIVE 06/07/2014 1233   Sepsis Labs Invalid input(s): PROCALCITONIN,  WBC,  LACTICIDVEN Microbiology Recent Results (from the past 240 hour(s))  Blood Culture (routine x 2)     Status: None   Collection Time: 12/29/18  9:15 PM   Specimen: BLOOD  Result Value Ref Range Status   Specimen Description BLOOD BLOOD RIGHT FOREARM  Final   Special Requests   Final    BOTTLES DRAWN AEROBIC AND ANAEROBIC Blood Culture adequate volume   Culture   Final    NO GROWTH 5 DAYS Performed at [facility], [address], [place], [place] [address]    Report Status 01/03/2019 FINAL  Final  Urine culture     Status: None   Collection Time: 12/29/18  9:15 PM   Specimen: In/Out Cath Urine  Result Value Ref Range Status   Specimen Description   Final    IN/OUT CATH URINE Performed at [facility], [address], [place], [place] [address]    Special Requests   Final    NONE Performed at [facility], [address] [place], [place] [address]    Culture   Final    NO GROWTH Performed at [facility], [place] [address], [place], [place] [address]    Report Status 12/31/2018 FINAL  Final  SARS CORONAVIRUS 2 (TAT 6-24 HRS) Nasopharyngeal Nasopharyngeal Swab     Status: None   Collection Time: 12/29/18  9:15 PM   Specimen: Nasopharyngeal Swab  Result Value Ref Range Status   SARS Coronavirus 2 NEGATIVE NEGATIVE Final    Comment: (NOTE) SARS-CoV-2 target nucleic acids are NOT DETECTED. The SARS-CoV-2 RNA is generally detectable in upper and lower respiratory specimens during the acute phase of infection. Negative results do not preclude SARS-CoV-2 infection, do not rule out co-infections with other  pathogens, and should not be used as the sole basis for treatment or other patient management decisions. Negative results must be combined with clinical observations, patient history, and epidemiological information. The expected result is Negative. Fact Sheet for Patients: SugarRoll.be Fact Sheet for Healthcare Providers: https://www.woods-mathews.com/ This test is not yet approved or cleared by the [place] FDA and  has been authorized for detection and/or diagnosis of SARS-CoV-2 by FDA under an Emergency Use Authorization (EUA). This EUA will remain  in effect (meaning this test can be used) for the duration of the COVID-19 declaration under Section 56 4(b)(1) of the Act, 21 U.S.C. section 360bbb-3(b)(1), unless the authorization is terminated or revoked sooner. Performed at [facility], [place] [address] [place], [place] [address]   Respiratory Panel by PCR     Status: None   Collection Time: 12/30/18 12:17 AM   Specimen: Nasopharyngeal Swab; Respiratory  Result Value Ref Range Status   Adenovirus NOT DETECTED NOT DETECTED Final   Coronavirus 229E NOT DETECTED NOT DETECTED Final    Comment: (NOTE) The Coronavirus on the Respiratory Panel, DOES NOT test for the novel  Coronavirus (2019 nCoV)    Coronavirus HKU1 NOT DETECTED NOT DETECTED Final   Coronavirus NL63 NOT DETECTED NOT DETECTED Final   Coronavirus OC43 NOT DETECTED NOT DETECTED Final   Metapneumovirus NOT DETECTED NOT DETECTED Final   Rhinovirus / Enterovirus NOT DETECTED NOT DETECTED Final   Influenza A NOT DETECTED NOT DETECTED Final   Influenza B NOT DETECTED NOT DETECTED Final   Parainfluenza Virus 1 NOT DETECTED NOT DETECTED Final   Parainfluenza Virus 2 NOT DETECTED NOT DETECTED Final   Parainfluenza Virus 3 NOT DETECTED NOT DETECTED Final   Parainfluenza Virus 4 NOT DETECTED NOT DETECTED Final   Respiratory Syncytial Virus NOT DETECTED NOT DETECTED Final    Bordetella pertussis NOT DETECTED NOT DETECTED Final   Chlamydophila pneumoniae NOT DETECTED NOT DETECTED Final   Mycoplasma pneumoniae NOT DETECTED NOT DETECTED Final    Comment: Performed at [facility], [place]. [address], [place], [place] [address]  Culture, blood (single) w Reflex to ID Panel     Status: None (Preliminary result)   Collection Time: 12/30/18  7:11 PM   Specimen: BLOOD  Result Value Ref Range Status   Specimen Description BLOOD Blood Culture adequate volume  Final   Special Requests BOTTLES DRAWN AEROBIC ONLY BLOOD LEFT FOREARM  Final   Culture   Final    NO GROWTH 4 DAYS Performed at [facility], [address] [place], [place] [address]    Report Status PENDING  Incomplete  MRSA PCR Screening     Status: None   Collection Time: 12/31/18  8:43 PM  Specimen: Nasopharyngeal  Result Value Ref Range Status   MRSA by PCR NEGATIVE NEGATIVE Final    Comment:        The GeneXpert MRSA Assay (FDA approved for NASAL specimens only), is one component of a comprehensive MRSA colonization surveillance program. It is not intended to diagnose MRSA infection nor to guide or monitor treatment for MRSA infections. Performed at [facility], [address], [place], [place] [address]      Time coordinating discharge: 35 minutes  SIGNED:   Louellen Molder, MD  [facility] 01/03/2019, 11:30 AM Pager   If 7PM-7AM, please contact night-coverage [email or web address] Password [email or web address]

## 2019-01-04 LAB — CULTURE, BLOOD (SINGLE)
Culture: NO GROWTH
Specimen Description: ADEQUATE

## 2019-01-07 ENCOUNTER — Emergency Department
Admission: EM | Admit: 2019-01-07 | Discharge: 2019-01-07 | Disposition: A | Discharge status: Home / Self Care | Payer: Medicare PPO | Attending: Emergency Medicine | Admitting: Emergency Medicine

## 2019-01-07 ENCOUNTER — Other Ambulatory Visit: Payer: Self-pay

## 2019-01-07 ENCOUNTER — Encounter: Payer: Self-pay | Admitting: *Deleted

## 2019-01-07 DIAGNOSIS — E1165 Type 2 diabetes mellitus with hyperglycemia: Secondary | ICD-10-CM | POA: Insufficient documentation

## 2019-01-07 DIAGNOSIS — E860 Dehydration: Secondary | ICD-10-CM | POA: Diagnosis not present

## 2019-01-07 DIAGNOSIS — Z7982 Long term (current) use of aspirin: Secondary | ICD-10-CM | POA: Insufficient documentation

## 2019-01-07 DIAGNOSIS — Z79899 Other long term (current) drug therapy: Secondary | ICD-10-CM | POA: Insufficient documentation

## 2019-01-07 DIAGNOSIS — I10 Essential (primary) hypertension: Secondary | ICD-10-CM | POA: Diagnosis not present

## 2019-01-07 DIAGNOSIS — Z794 Long term (current) use of insulin: Secondary | ICD-10-CM | POA: Insufficient documentation

## 2019-01-07 DIAGNOSIS — R739 Hyperglycemia, unspecified: Secondary | ICD-10-CM

## 2019-01-07 DIAGNOSIS — Z87891 Personal history of nicotine dependence: Secondary | ICD-10-CM | POA: Insufficient documentation

## 2019-01-07 LAB — CBC
HCT: 34.2 % — ABNORMAL LOW (ref 36.0–46.0)
Hemoglobin: 10.7 g/dL — ABNORMAL LOW (ref 12.0–15.0)
MCH: 23.6 pg — ABNORMAL LOW (ref 26.0–34.0)
MCHC: 31.3 g/dL (ref 30.0–36.0)
MCV: 75.5 fL — ABNORMAL LOW (ref 80.0–100.0)
Platelets: 318 10*3/uL (ref 150–400)
RBC: 4.53 MIL/uL (ref 3.87–5.11)
RDW: 14.6 % (ref 11.5–15.5)
WBC: 12.8 10*3/uL — ABNORMAL HIGH (ref 4.0–10.5)
nRBC: 0 % (ref 0.0–0.2)

## 2019-01-07 LAB — GLUCOSE, CAPILLARY
Glucose-Capillary: 523 mg/dL (ref 70–99)
Glucose-Capillary: 396 mg/dL — ABNORMAL HIGH (ref 70–99)
Glucose-Capillary: 235 mg/dL — ABNORMAL HIGH (ref 70–99)

## 2019-01-07 LAB — URINALYSIS, COMPLETE (UACMP) WITH MICROSCOPIC
Bacteria, UA: NONE SEEN
Bilirubin Urine: NEGATIVE
Glucose, UA: >=500 mg/dL — AB
Hgb urine dipstick: NEGATIVE
Ketones, ur: NEGATIVE mg/dL
Leukocytes,Ua: NEGATIVE
Nitrite: NEGATIVE
Protein, ur: 30 mg/dL — AB
Specific Gravity, Urine: 1.014 (ref 1.005–1.030)
pH: 6 (ref 5.0–8.0)

## 2019-01-07 LAB — BASIC METABOLIC PANEL
Anion gap: 13 (ref 5–15)
BUN: 21 mg/dL (ref 8–23)
CO2: 20 mmol/L — ABNORMAL LOW (ref 22–32)
Calcium: 9.1 mg/dL (ref 8.9–10.3)
Chloride: 98 mmol/L (ref 98–111)
Creatinine, Ser: 1.64 mg/dL — ABNORMAL HIGH (ref 0.44–1.00)
GFR calc Af Amer: 37 mL/min — ABNORMAL LOW (ref >60)
GFR calc non Af Amer: 32 mL/min — ABNORMAL LOW (ref >60)
Glucose, Bld: 489 mg/dL — ABNORMAL HIGH (ref 70–99)
Potassium: 5.2 mmol/L — ABNORMAL HIGH (ref 3.5–5.1)
Sodium: 131 mmol/L — ABNORMAL LOW (ref 135–145)

## 2019-01-07 MED ORDER — LANTUS SOLOSTAR 100 UNIT/ML ~~LOC~~ SOPN: 25.0000 [IU] | PEN_INJECTOR | Freq: Every day | SUBCUTANEOUS | 0 refills | Status: DC

## 2019-01-07 MED ORDER — INSULIN ASPART 100 UNIT/ML ~~LOC~~ SOLN: 10.0000 [IU] | Freq: Once | SUBCUTANEOUS | Status: DC

## 2019-01-07 MED ORDER — INSULIN ASPART 100 UNIT/ML ~~LOC~~ SOLN: SUBCUTANEOUS | 1 refills | Status: DC

## 2019-01-07 MED ORDER — LANTUS SOLOSTAR 100 UNIT/ML ~~LOC~~ SOPN: 25.0000 [IU] | PEN_INJECTOR | Freq: Every day | SUBCUTANEOUS | 11 refills | Status: DC

## 2019-01-07 MED ORDER — SODIUM CHLORIDE 0.9 % IV BOLUS
1000.0000 mL | Freq: Once | INTRAVENOUS | Status: AC
  Administered 2019-01-07: 1000 mL via INTRAVENOUS

## 2019-01-07 MED ORDER — INSULIN ASPART 100 UNIT/ML ~~LOC~~ SOLN
6.0000 [IU] | Freq: Once | SUBCUTANEOUS | Status: AC
  Administered 2019-01-07: 6 [IU] via INTRAVENOUS
  Filled 2019-01-07: qty 1

## 2019-01-07 NOTE — ED Provider Notes (Signed)
[facility] Emergency Department Provider Note  ____________________________________________  Time seen: Approximately 6:45 PM  I have reviewed the triage vital signs and the nursing notes.   HISTORY  Chief Complaint Hyperglycemia    HPI Audrey Chan is a 68 y.o. female with a history of GERD hypertension diabetes, recently hospitalized with a stroke and discharged home 4 days ago with a change in her insulin regimen who comes the ED today complaining of blood sugar running high in the range of 400-500 for the last 2 days.  Also reports decreased appetite and slightly decreased energy level.  No focal pain complaints, fevers chills body aches dysuria shortness of breath or cough.  She has been taking Lantus insulin 20 units at bedtime as prescribed for the past few days.  Today when her blood sugar was 500 at home she took an additional 20 units of Lantus this morning.        Past Medical History:  Diagnosis Date  . Allergic rhinitis   . Aneurysm of unspecified site ([place])   . Cholelithiasis   . Diabetes type 2, controlled ([place])   . GERD (gastroesophageal reflux disease)   . Hypertension   . Neuropathy   . Veno-occlusive disease      Patient Active Problem List   Diagnosis Date Noted  . Uncontrolled hypertension 01/03/2019  . Lobar pneumonia ([place]) 01/03/2019  . Cerebellar stroke, acute ([place]) 01/03/2019  . DKA (diabetic ketoacidoses) ([place]) 12/30/2018  . Acute metabolic encephalopathy [id number]  . Sepsis with acute renal failure without septic shock ([place])   . AKI (acute kidney injury) ([place])   . Hyponatremia   . Hyperkalemia   . DDD (degenerative disc disease), lumbar 08/08/2018  . Acute bilateral low back pain with bilateral sciatica 08/08/2018  . Lumbar spondylosis with myelopathy 08/08/2018  . Chronic pain syndrome 08/08/2018  . Pulmonary nodules 01/28/2015  . Supraclavicular adenopathy 01/21/2015  . Abnormal MRI, lumbar spine  01/01/2015  . Weight loss, unintentional 01/01/2015  . Lymphocytosis 01/01/2015  . Microcytic red blood cells 01/01/2015  . Recurrent biliary colic [id number]  . Calculus of gallbladder with acute on chronic cholecystitis without obstruction 12/22/2014  . Encounter for fitting or adjustment of insulin pump 08/09/2014  . Diabetic peripheral neuropathy associated with type 2 diabetes mellitus ([place]) 08/09/2014  . Hypertension 05/24/2012  . Diabetes mellitus ([place]) 05/24/2012  . Idiopathic localized osteoarthropathy 04/09/2012  . Dermatitis, eczematoid 02/27/2012  . Can't get food down 09/26/2011     Past Surgical History:  Procedure Laterality Date  . ABDOMINAL HYSTERECTOMY  1972  . CERVICAL FUSION    . CHOLECYSTECTOMY N/A 12/28/2014   Procedure: LAPAROSCOPIC CHOLECYSTECTOMY;  Surgeon: Sherri Rad, MD;  Location: [facility] ORS;  Service: General;  Laterality: N/A;  . EYE SURGERY     detached retina  . MASS EXCISION Left 05/06/2018   Procedure: EXCISION OF LEFT ANTECUBITAL CYST, DIABETIC;  Surgeon: Vickie Epley, MD;  Location: [facility] ORS;  Service: Vascular;  Laterality: Left;     Prior to Admission medications   Medication Sig Start Date End Date Taking? Authorizing Provider  acetaminophen (TYLENOL) 500 MG tablet Take 500-1,000 mg by mouth every 6 (six) hours as needed for moderate pain.     [provider]  albuterol (PROVENTIL HFA;VENTOLIN HFA) 108 (90 BASE) MCG/ACT inhaler Inhale 2 puffs into the lungs every 6 (six) hours as needed for wheezing or shortness of breath.     [provider]  amLODipine (NORVASC) 5 MG tablet  Take 1 tablet (5 mg total) by mouth daily. 01/04/19   Dhungel, Flonnie Overman, MD  aspirin EC 81 MG tablet Take 81 mg by mouth daily.    [provider]  cetirizine (ZYRTEC) 10 MG tablet Take 10 mg by mouth daily.     [provider]  Cholecalciferol (VITAMIN D3) 1000 units CAPS Take 1,000 Units by mouth daily.     [provider]   clopidogrel (PLAVIX) 75 MG tablet Take 75 mg by mouth daily.    [provider]  ferrous sulfate 325 (65 FE) MG tablet Take 325 mg by mouth 2 (two) times daily with a meal.     [provider]  fluticasone (FLONASE) 50 MCG/ACT nasal spray Place 2 sprays into both nostrils daily as needed for allergies or rhinitis.     [provider]  gabapentin (NEURONTIN) 800 MG tablet Take 800 mg by mouth 3 (three) times daily.  12/26/16   [provider]  hydrochlorothiazide (HYDRODIURIL) 25 MG tablet Take 25 mg by mouth daily.    [provider]  insulin aspart (NOVOLOG) 100 UNIT/ML injection Check blood sugar prior to meals and give insulin on the following scale: Blood sugar less than 150: 0 units BS 150-200: 2 units BS 200-250: 3 units BS 250-300: 4 units BS 300-350: 5 units BS 350-400: 6 units BS above 400: 8 units and call your doctor. 01/07/19   Carrie Mew, MD  LANTUS SOLOSTAR 100 UNIT/ML Solostar Pen Inject 25 Units into the skin at bedtime. 01/07/19   Carrie Mew, MD  lisinopril (ZESTRIL) 10 MG tablet Take 2 tablets (20 mg total) by mouth daily. 01/03/19   Dhungel, Flonnie Overman, MD  metFORMIN (GLUCOPHAGE-XR) 500 MG 24 hr tablet Take 1,000 mg by mouth daily.     [provider]  mometasone (ELOCON) 0.1 % cream Apply 1 application topically 2 (two) times daily as needed (rash).  10/08/17   [provider]  Multiple Vitamin (MULTI-VITAMINS) TABS Take 1 tablet by mouth daily.    [provider]  polyethylene glycol powder (GLYCOLAX/MIRALAX) powder Take 0.5 Containers by mouth daily as needed for moderate constipation.  10/08/15   [provider]  rosuvastatin (CRESTOR) 20 MG tablet Take 20 mg by mouth daily.  05/02/13   [provider]  umeclidinium-vilanterol (ANORO ELLIPTA) 62.5-25 MCG/INH AEPB Inhale 1 puff into the lungs daily.    [provider]     Allergies Patient has no known  allergies.   Family History  Problem Relation Age of Onset  . Lung cancer Father     Social History Social History   Tobacco Use  . Smoking status: Former Smoker    Quit date: 02/21/1999    Years since quitting: 19.8  . Smokeless tobacco: Never Used  Substance Use Topics  . Alcohol use: No    Alcohol/week: 0.0 standard drinks  . Drug use: No    Review of Systems  Constitutional:   No fever or chills.  ENT:   No sore throat. No rhinorrhea. Cardiovascular:   No chest pain or syncope. Respiratory:   No dyspnea or cough. Gastrointestinal:   Negative for abdominal pain, vomiting and diarrhea.  Musculoskeletal:   Negative for focal pain or swelling All other systems reviewed and are negative except as documented above in ROS and HPI.  ____________________________________________   PHYSICAL EXAM:  VITAL SIGNS: ED Triage Vitals  Enc Vitals Group     BP 01/07/19 1618 120/73     Pulse  Rate 01/07/19 1618 98     Resp 01/07/19 1618 16     Temp 01/07/19 1618 99 F (37.2 C)     Temp Source 01/07/19 1618 Oral     SpO2 01/07/19 1618 97 %     Weight 01/07/19 1618 122 lb (55.3 kg)     Height 01/07/19 1618 5\' 6"  (1.676 m)     Head Circumference --      Peak Flow --      Pain Score 01/07/19 1623 0     Pain Loc --      Pain Edu? --      Excl. in [place]? --     Vital signs reviewed, nursing assessments reviewed.   Constitutional:   Alert and oriented. Non-toxic appearance. Eyes:   Conjunctivae are normal. EOMI. PERRL. ENT      Head:   Normocephalic and atraumatic.      Nose:   Wearing a mask.      Mouth/Throat:   Wearing a mask.      Neck:   No meningismus. Full ROM. Hematological/Lymphatic/Immunilogical:   No cervical lymphadenopathy. Cardiovascular:   RRR. Symmetric bilateral radial and DP pulses.  No murmurs. Cap refill less than 2 seconds. Respiratory:   Normal respiratory effort without tachypnea/retractions. Breath sounds are clear and equal bilaterally. No  wheezes/rales/rhonchi. Gastrointestinal:   Soft and nontender. Non distended. There is no CVA tenderness.  No rebound, rigidity, or guarding.  Musculoskeletal:   Normal range of motion in all extremities. No joint effusions.  No lower extremity tenderness.  No edema. Neurologic:   Normal speech and language.  Motor grossly intact. No acute focal neurologic deficits are appreciated.  Skin:    Skin is warm, dry and intact. No rash noted.  No petechiae, purpura, or bullae.  ____________________________________________    LABS (pertinent positives/negatives) (all labs ordered are listed, but only abnormal results are displayed) Labs Reviewed  CBC - Abnormal; Notable for the following components:      Result Value   WBC 12.8 (*)    Hemoglobin 10.7 (*)    HCT 34.2 (*)    MCV 75.5 (*)    MCH 23.6 (*)    All other components within normal limits  URINALYSIS, COMPLETE (UACMP) WITH MICROSCOPIC - Abnormal; Notable for the following components:   Color, Urine YELLOW (*)    APPearance CLEAR (*)    Glucose, UA >=500 (*)    Protein, ur 30 (*)    All other components within normal limits  GLUCOSE, CAPILLARY - Abnormal; Notable for the following components:   Glucose-Capillary 523 (*)    All other components within normal limits  BASIC METABOLIC PANEL - Abnormal; Notable for the following components:   Sodium 131 (*)    Potassium 5.2 (*)    CO2 20 (*)    Glucose, Bld 489 (*)    Creatinine, Ser 1.64 (*)    GFR calc non Af Amer 32 (*)    GFR calc Af Amer 37 (*)    All other components within normal limits  GLUCOSE, CAPILLARY - Abnormal; Notable for the following components:   Glucose-Capillary 396 (*)    All other components within normal limits  GLUCOSE, CAPILLARY - Abnormal; Notable for the following components:   Glucose-Capillary 235 (*)    All other components within normal limits  CBG MONITORING, ED  CBG MONITORING, ED    ____________________________________________   EKG    ____________________________________________    RADIOLOGY  No results found.  ____________________________________________   PROCEDURES Procedures  ____________________________________________    CLINICAL IMPRESSION / ASSESSMENT AND PLAN / ED COURSE  Medications ordered in the ED: Medications  sodium chloride 0.9 % bolus 1,000 mL (1,000 mLs Intravenous New Bag/Given 01/07/19 1859)  sodium chloride 0.9 % bolus 1,000 mL (1,000 mLs Intravenous New Bag/Given 01/07/19 1940)  insulin aspart (novoLOG) injection 6 Units (6 Units Intravenous Given 01/07/19 1859)    Pertinent labs & imaging results that were available during my care of the patient were reviewed by me and considered in my medical decision making (see chart for details).  LEONELA NAKAYAMA was evaluated in Emergency Department on 01/07/2019 for the symptoms described in the history of present illness. She was evaluated in the context of the global COVID-19 pandemic, which necessitated consideration that the patient might be at risk for infection with the SARS-CoV-2 virus that causes COVID-19. Institutional protocols and algorithms that pertain to the evaluation of patients at risk for COVID-19 are in a state of rapid change based on information released by regulatory bodies including the CDC and federal and state organizations. These policies and algorithms were followed during the patient's care in the ED.   Patient presents with hyperglycemia causing dehydration.  No evidence of DKA, no evidence of acute illness to incite the hyperglycemia.  It appears to be after effect from her recent stroke and hospitalization as well as change in her diabetes regimen.  Vital signs are normal, exam is benign and reassuring.  Patient is ambulatory, steady, nontoxic and well-appearing.  She feels much better after receiving 1 L bolus of IV saline.  We will recheck her blood sugar  to determine if she needs extra bolus insulin or fluids where she is stable for discharge home.  I will adjust her insulin regimen by increasing Lantus to 25 units at night and add a low-dose sliding scale insulin during the day.  Encouraged her to call her endocrinologist, Dr. Gabriel Carina to follow-up this week.  She has an appointment in 2 days with her neurologist for stroke follow-up.  ----------------------------------------- 8:32 PM on 01/07/2019 -----------------------------------------  Blood sugar now 235.  Patient feels very well and back to normal.  Will discharge with some insulin regimen modification to follow-up with her endocrinologist.      ____________________________________________   FINAL CLINICAL IMPRESSION(S) / ED DIAGNOSES    Final diagnoses:  Hyperglycemia  Dehydration     ED Discharge Orders         Ordered    LANTUS SOLOSTAR 100 UNIT/ML Solostar Pen  Daily at bedtime,   Status:  Discontinued     01/07/19 2020    insulin aspart (NOVOLOG) 100 UNIT/ML injection  Status:  Discontinued     01/07/19 2020    insulin aspart (NOVOLOG) 100 UNIT/ML injection     01/07/19 2030    LANTUS SOLOSTAR 100 UNIT/ML Solostar Pen  Daily at bedtime     01/07/19 2030          Portions of this note were generated with dragon dictation software. Dictation errors may occur despite best attempts at proofreading.   Carrie Mew, MD 01/07/19 2032

## 2019-01-07 NOTE — ED Triage Notes (Signed)
Pt to ED reporting new onset of hyperglycemia (532 in triage). Pt reports she has been feeling "okay" and has not been eating different than normal. One change in medications over the weekend, metoprolol 12.5mg  BID.

## 2019-01-07 NOTE — Discharge Instructions (Signed)
Increase lantus insulin to 25 units at bedtime.   Start taking short-acting correction insulin before meals three times a day by checking your blood sugar and applying the following scale: Blood sugar less than 150: 0 units  BS 150-200: 2 units  BS 200-250: 3 units  BS 250-300: 4 units  BS 300-350: 5 units  BS 350-400: 6 units  BS above 400: 8 units and call your doctor

## 2019-01-07 NOTE — ED Notes (Signed)
Recollection of lt green tube sent to lab at this time.

## 2019-01-19 ENCOUNTER — Other Ambulatory Visit: Payer: Self-pay

## 2019-01-19 ENCOUNTER — Inpatient Hospital Stay
Admission: EM | Admit: 2019-01-19 | Discharge: 2019-01-25 | DRG: 163 | Disposition: A | Discharge status: Home Health | Payer: Medicare PPO | Attending: Internal Medicine | Admitting: Internal Medicine

## 2019-01-19 ENCOUNTER — Emergency Department: Payer: Medicare PPO

## 2019-01-19 DIAGNOSIS — D649 Anemia, unspecified: Secondary | ICD-10-CM

## 2019-01-19 DIAGNOSIS — Z8249 Family history of ischemic heart disease and other diseases of the circulatory system: Secondary | ICD-10-CM

## 2019-01-19 DIAGNOSIS — R7989 Other specified abnormal findings of blood chemistry: Secondary | ICD-10-CM

## 2019-01-19 DIAGNOSIS — J432 Centrilobular emphysema: Secondary | ICD-10-CM | POA: Diagnosis present

## 2019-01-19 DIAGNOSIS — N183 Chronic kidney disease, stage 3 unspecified: Secondary | ICD-10-CM

## 2019-01-19 DIAGNOSIS — I82403 Acute embolism and thrombosis of unspecified deep veins of lower extremity, bilateral: Secondary | ICD-10-CM | POA: Diagnosis present

## 2019-01-19 DIAGNOSIS — I129 Hypertensive chronic kidney disease with stage 1 through stage 4 chronic kidney disease, or unspecified chronic kidney disease: Secondary | ICD-10-CM | POA: Diagnosis present

## 2019-01-19 DIAGNOSIS — E1142 Type 2 diabetes mellitus with diabetic polyneuropathy: Secondary | ICD-10-CM | POA: Diagnosis present

## 2019-01-19 DIAGNOSIS — I2699 Other pulmonary embolism without acute cor pulmonale: Secondary | ICD-10-CM | POA: Diagnosis not present

## 2019-01-19 DIAGNOSIS — Z801 Family history of malignant neoplasm of trachea, bronchus and lung: Secondary | ICD-10-CM

## 2019-01-19 DIAGNOSIS — E785 Hyperlipidemia, unspecified: Secondary | ICD-10-CM | POA: Diagnosis present

## 2019-01-19 DIAGNOSIS — N1832 Chronic kidney disease, stage 3b: Secondary | ICD-10-CM | POA: Diagnosis present

## 2019-01-19 DIAGNOSIS — Z821 Family history of blindness and visual loss: Secondary | ICD-10-CM

## 2019-01-19 DIAGNOSIS — Z90710 Acquired absence of both cervix and uterus: Secondary | ICD-10-CM

## 2019-01-19 DIAGNOSIS — I272 Pulmonary hypertension, unspecified: Secondary | ICD-10-CM | POA: Diagnosis present

## 2019-01-19 DIAGNOSIS — E1122 Type 2 diabetes mellitus with diabetic chronic kidney disease: Secondary | ICD-10-CM | POA: Diagnosis present

## 2019-01-19 DIAGNOSIS — I2694 Multiple subsegmental pulmonary emboli without acute cor pulmonale: Secondary | ICD-10-CM | POA: Diagnosis not present

## 2019-01-19 DIAGNOSIS — D631 Anemia in chronic kidney disease: Secondary | ICD-10-CM | POA: Diagnosis present

## 2019-01-19 DIAGNOSIS — Z7902 Long term (current) use of antithrombotics/antiplatelets: Secondary | ICD-10-CM

## 2019-01-19 DIAGNOSIS — C787 Secondary malignant neoplasm of liver and intrahepatic bile duct: Secondary | ICD-10-CM

## 2019-01-19 DIAGNOSIS — R9431 Abnormal electrocardiogram [ECG] [EKG]: Secondary | ICD-10-CM

## 2019-01-19 DIAGNOSIS — Z794 Long term (current) use of insulin: Secondary | ICD-10-CM

## 2019-01-19 DIAGNOSIS — R918 Other nonspecific abnormal finding of lung field: Secondary | ICD-10-CM

## 2019-01-19 DIAGNOSIS — G894 Chronic pain syndrome: Secondary | ICD-10-CM | POA: Diagnosis present

## 2019-01-19 DIAGNOSIS — G629 Polyneuropathy, unspecified: Secondary | ICD-10-CM | POA: Diagnosis present

## 2019-01-19 DIAGNOSIS — Z20828 Contact with and (suspected) exposure to other viral communicable diseases: Secondary | ICD-10-CM | POA: Diagnosis present

## 2019-01-19 DIAGNOSIS — E440 Moderate protein-calorie malnutrition: Secondary | ICD-10-CM | POA: Insufficient documentation

## 2019-01-19 DIAGNOSIS — Z825 Family history of asthma and other chronic lower respiratory diseases: Secondary | ICD-10-CM

## 2019-01-19 DIAGNOSIS — R634 Abnormal weight loss: Secondary | ICD-10-CM | POA: Diagnosis present

## 2019-01-19 DIAGNOSIS — J984 Other disorders of lung: Secondary | ICD-10-CM

## 2019-01-19 DIAGNOSIS — Z87891 Personal history of nicotine dependence: Secondary | ICD-10-CM

## 2019-01-19 DIAGNOSIS — D509 Iron deficiency anemia, unspecified: Secondary | ICD-10-CM | POA: Diagnosis present

## 2019-01-19 DIAGNOSIS — J9601 Acute respiratory failure with hypoxia: Secondary | ICD-10-CM

## 2019-01-19 DIAGNOSIS — Z8673 Personal history of transient ischemic attack (TIA), and cerebral infarction without residual deficits: Secondary | ICD-10-CM

## 2019-01-19 DIAGNOSIS — Z7982 Long term (current) use of aspirin: Secondary | ICD-10-CM

## 2019-01-19 DIAGNOSIS — Z818 Family history of other mental and behavioral disorders: Secondary | ICD-10-CM

## 2019-01-19 DIAGNOSIS — Z79899 Other long term (current) drug therapy: Secondary | ICD-10-CM

## 2019-01-19 DIAGNOSIS — K769 Liver disease, unspecified: Secondary | ICD-10-CM

## 2019-01-19 LAB — CBC WITH DIFFERENTIAL/PLATELET
Abs Immature Granulocytes: 0.04 10*3/uL (ref 0.00–0.07)
Basophils Absolute: 0.1 10*3/uL (ref 0.0–0.1)
Basophils Relative: 1 %
Eosinophils Absolute: 0.1 10*3/uL (ref 0.0–0.5)
Eosinophils Relative: 1 %
HCT: 31 % — ABNORMAL LOW (ref 36.0–46.0)
Hemoglobin: 9.9 g/dL — ABNORMAL LOW (ref 12.0–15.0)
Immature Granulocytes: 0 %
Lymphocytes Relative: 20 %
Lymphs Abs: 2.2 10*3/uL (ref 0.7–4.0)
MCH: 23.2 pg — ABNORMAL LOW (ref 26.0–34.0)
MCHC: 31.9 g/dL (ref 30.0–36.0)
MCV: 72.6 fL — ABNORMAL LOW (ref 80.0–100.0)
Monocytes Absolute: 0.9 10*3/uL (ref 0.1–1.0)
Monocytes Relative: 9 %
Neutro Abs: 7.6 10*3/uL (ref 1.7–7.7)
Neutrophils Relative %: 69 %
Platelets: 355 10*3/uL (ref 150–400)
RBC: 4.27 MIL/uL (ref 3.87–5.11)
RDW: 14.4 % (ref 11.5–15.5)
WBC: 11 10*3/uL — ABNORMAL HIGH (ref 4.0–10.5)
nRBC: 0 % (ref 0.0–0.2)

## 2019-01-19 LAB — LACTIC ACID, PLASMA: Lactic Acid, Venous: 1.9 mmol/L (ref 0.5–1.9)

## 2019-01-19 NOTE — ED Provider Notes (Signed)
[facility] Emergency Department Provider Note  ____________________________________________   First MD Initiated Contact with Patient 01/19/19 2306     (approximate)  I have reviewed the triage vital signs and the nursing notes.   HISTORY  Chief Complaint Shortness of Breath and Dizziness    HPI Audrey Chan is a 68 y.o. female with medical history as listed below notable for insulin-dependent diabetes and recent complicated medical history including admission about 2 weeks ago for metabolic encephalopathy thought to be secondary to both diabetes/DKA and sepsis with acute renal failure due to UTI.  During that admission she was found  to have a small cerebellar stroke as well.  She was already on dual antiplatelet therapy and no additional intervention was necessary for the stroke.  She has no residual deficits although it is unclear if she has continued to have some dizziness since the admission or if it is more of a recent development.  She followed up with an ED visit for hyperglycemia a little over a week ago and had a visit with her neurologist a couple of days ago where she was told she needs to stay home and get some rest and recuperate.  She presents tonight by EMS for persistent versus worsening dizziness particularly when she changes positions or stands up to walk as well as worsening shortness of breath.  She also reports that when she gets up to move around she feels "anxious, like a doctor is making me nervous".  The symptoms have apparently been going on for about 4 to 5 days.  She says that "my rear" hurts from sitting and lying around and that her body hurts when she gets up to walk around.  She becomes short of breath and feels like she cannot get enough air.  She has not had a cough or sore throat.  She does denies any known COVID-19 contacts and does not get out of the house very much.  She denies sore throat, chest pain, vomiting, dysuria, and  diarrhea.  She has had some nausea earlier tonight.  She describes her symptoms as severe and nothing in particular makes them better, worse with exertion and change of position.        Past Medical History:  Diagnosis Date   Allergic rhinitis    Aneurysm of unspecified site [facility])    Cholelithiasis    Diabetes type 2, controlled ([place])    GERD (gastroesophageal reflux disease)    Hypertension    Neuropathy    Veno-occlusive disease     Patient Active Problem List   Diagnosis Date Noted   Uncontrolled hypertension 01/03/2019   Lobar pneumonia ([place]) 01/03/2019   Cerebellar stroke, acute ([place]) 01/03/2019   DKA (diabetic ketoacidoses) ([place]) 19/50/9326   Acute metabolic encephalopathy 71/24/5809   Sepsis with acute renal failure without septic shock (HCC)    AKI (acute kidney injury) ([place])    Hyponatremia    Hyperkalemia    DDD (degenerative disc disease), lumbar 08/08/2018   Acute bilateral low back pain with bilateral sciatica 08/08/2018   Lumbar spondylosis with myelopathy 08/08/2018   Chronic pain syndrome 08/08/2018   Pulmonary nodules 01/28/2015   Supraclavicular adenopathy 01/21/2015   Abnormal MRI, lumbar spine 01/01/2015   Weight loss, unintentional 01/01/2015   Lymphocytosis 01/01/2015   Microcytic red blood cells 01/01/2015   Recurrent biliary colic 98/33/8250   Calculus of gallbladder with acute on chronic cholecystitis without obstruction 12/22/2014   Encounter for fitting or adjustment of  insulin pump 08/09/2014   Diabetic peripheral neuropathy associated with type 2 diabetes mellitus ([place]) 08/09/2014   Hypertension 05/24/2012   Diabetes mellitus ([place]) 05/24/2012   Idiopathic localized osteoarthropathy 04/09/2012   Dermatitis, eczematoid 02/27/2012   Can't get food down 09/26/2011    Past Surgical History:  Procedure Laterality Date   ABDOMINAL HYSTERECTOMY  1972   CERVICAL FUSION     CHOLECYSTECTOMY N/A 12/28/2014    Procedure: LAPAROSCOPIC CHOLECYSTECTOMY;  Surgeon: Sherri Rad, MD;  Location: [facility] ORS;  Service: General;  Laterality: N/A;   EYE SURGERY     detached retina   MASS EXCISION Left 05/06/2018   Procedure: EXCISION OF LEFT ANTECUBITAL CYST, DIABETIC;  Surgeon: Vickie Epley, MD;  Location: [facility] ORS;  Service: Vascular;  Laterality: Left;    Prior to Admission medications   Medication Sig Start Date End Date Taking? Authorizing Provider  acetaminophen (TYLENOL) 500 MG tablet Take 500-1,000 mg by mouth every 6 (six) hours as needed for moderate pain.     [provider]  albuterol (PROVENTIL HFA;VENTOLIN HFA) 108 (90 BASE) MCG/ACT inhaler Inhale 2 puffs into the lungs every 6 (six) hours as needed for wheezing or shortness of breath.     [provider]  amLODipine (NORVASC) 5 MG tablet Take 1 tablet (5 mg total) by mouth daily. 01/04/19   Dhungel, Flonnie Overman, MD  aspirin EC 81 MG tablet Take 81 mg by mouth daily.    [provider]  cetirizine (ZYRTEC) 10 MG tablet Take 10 mg by mouth daily.     [provider]  Cholecalciferol (VITAMIN D3) 1000 units CAPS Take 1,000 Units by mouth daily.     [provider]  clopidogrel (PLAVIX) 75 MG tablet Take 75 mg by mouth daily.    [provider]  ferrous sulfate 325 (65 FE) MG tablet Take 325 mg by mouth 2 (two) times daily with a meal.     [provider]  fluticasone (FLONASE) 50 MCG/ACT nasal spray Place 2 sprays into both nostrils daily as needed for allergies or rhinitis.     [provider]  gabapentin (NEURONTIN) 800 MG tablet Take 800 mg by mouth 3 (three) times daily.  12/26/16   [provider]  hydrochlorothiazide (HYDRODIURIL) 25 MG tablet Take 25 mg by mouth daily.    [provider]  insulin aspart (NOVOLOG) 100 UNIT/ML injection Check blood sugar prior to meals and give insulin on the following scale: Blood sugar less than 150: 0 units BS 150-200: 2  units BS 200-250: 3 units BS 250-300: 4 units BS 300-350: 5 units BS 350-400: 6 units BS above 400: 8 units and call your doctor. 01/07/19   Carrie Mew, MD  LANTUS SOLOSTAR 100 UNIT/ML Solostar Pen Inject 25 Units into the skin at bedtime. 01/07/19   Carrie Mew, MD  lisinopril (ZESTRIL) 10 MG tablet Take 2 tablets (20 mg total) by mouth daily. 01/03/19   Dhungel, Flonnie Overman, MD  metFORMIN (GLUCOPHAGE-XR) 500 MG 24 hr tablet Take 1,000 mg by mouth daily.     [provider]  mometasone (ELOCON) 0.1 % cream Apply 1 application topically 2 (two) times daily as needed (rash).  10/08/17   [provider]  Multiple Vitamin (MULTI-VITAMINS) TABS Take 1 tablet by mouth daily.    [provider]  polyethylene glycol powder (GLYCOLAX/MIRALAX) powder Take 0.5 Containers by mouth daily as needed for moderate constipation.  10/08/15   [provider]  rosuvastatin (CRESTOR) 20 MG tablet  Take 20 mg by mouth daily.  05/02/13   [provider]  umeclidinium-vilanterol (ANORO ELLIPTA) 62.5-25 MCG/INH AEPB Inhale 1 puff into the lungs daily.    [provider]    Allergies Patient has no known allergies.  Family History  Problem Relation Age of Onset   Lung cancer Father     Social History Social History   Tobacco Use   Smoking status: Former Smoker    Quit date: 02/21/1999    Years since quitting: 19.9   Smokeless tobacco: Never Used  Substance Use Topics   Alcohol use: No    Alcohol/week: 0.0 standard drinks   Drug use: No    Review of Systems Constitutional: General malaise and fatigue.  No fever/chills Eyes: No visual changes. ENT: No sore throat. Cardiovascular: Denies chest pain. Respiratory: Shortness of breath particularly with exertion. Gastrointestinal: No abdominal pain.  Nausea, no vomiting.  No diarrhea.  No constipation. Genitourinary: Negative for dysuria. Musculoskeletal: Some generalized body aches.   Negative for neck pain.  Negative for back pain. Integumentary: Negative for rash. Neurological: Intermittent dizziness particularly when she changes position or gets up to move around.  Negative for headaches, focal weakness or numbness.   ____________________________________________   PHYSICAL EXAM:  VITAL SIGNS: ED Triage Vitals  Enc Vitals Group     BP 01/19/19 2305 (!) 150/85     Pulse Rate 01/19/19 2305 95     Resp 01/19/19 2305 20     Temp 01/19/19 2306 97.9 F (36.6 C)     Temp Source 01/19/19 2306 Oral     SpO2 01/19/19 2305 (!) 84 %     Weight 01/19/19 2300 55.3 kg (122 lb)     Height 01/19/19 2300 1.676 m ('5\' 6"'$ )     Head Circumference --      Peak Flow --      Pain Score 01/19/19 2300 3     Pain Loc --      Pain Edu? --      Excl. in [place]? --     Constitutional: Alert and oriented.  Appears somewhat debilitated but in no acute distress at this time. Eyes: Conjunctivae are normal.  Head: Atraumatic. Nose: No congestion/rhinnorhea. Mouth/Throat: Patient is wearing a mask. Neck: No stridor.  No meningeal signs.   Cardiovascular: Normal rate, regular rhythm. Good peripheral circulation. Grossly normal heart sounds. Respiratory: Mild tachypnea at rest on 2 L of oxygen.  Normal respiratory effort.  No retractions. Gastrointestinal: Soft and nontender. No distention.  Musculoskeletal: No lower extremity tenderness nor edema. No gross deformities of extremities. Neurologic:  Normal speech and language. No gross focal neurologic deficits are appreciated.  Skin:  Skin is warm, dry and intact. Psychiatric: Mood and affect are normal. Speech and behavior are normal.  Patient is a bit of a vague historian but answers questions appropriately based on the medical record.  ____________________________________________   LABS (all labs ordered are listed, but only abnormal results are displayed)  Labs Reviewed  COMPREHENSIVE METABOLIC PANEL - Abnormal; Notable for the  following components:      Result Value   Sodium 130 (*)    Chloride 97 (*)    CO2 17 (*)    Glucose, Bld 181 (*)    BUN 28 (*)    Creatinine, Ser 1.58 (*)    Total Protein 8.6 (*)    AST 145 (*)    ALT 118 (*)    Alkaline Phosphatase 1,044 (*)    GFR  calc non Af Amer 33 (*)    GFR calc Af Amer 38 (*)    Anion gap 16 (*)    All other components within normal limits  BRAIN NATRIURETIC PEPTIDE - Abnormal; Notable for the following components:   B Natriuretic Peptide 876.0 (*)    All other components within normal limits  CBC WITH DIFFERENTIAL/PLATELET - Abnormal; Notable for the following components:   WBC 11.0 (*)    Hemoglobin 9.9 (*)    HCT 31.0 (*)    MCV 72.6 (*)    MCH 23.2 (*)    All other components within normal limits  PROTIME-INR - Abnormal; Notable for the following components:   Prothrombin Time 15.8 (*)    INR 1.3 (*)    All other components within normal limits  URINALYSIS, COMPLETE (UACMP) WITH MICROSCOPIC - Abnormal; Notable for the following components:   Color, Urine YELLOW (*)    APPearance HAZY (*)    Protein, ur 30 (*)    All other components within normal limits  FIBRIN DERIVATIVES D-DIMER ([facility] ONLY) - Abnormal; Notable for the following components:   Fibrin derivatives D-dimer [facility]) 9,242.68 (*)    All other components within normal limits  BETA-HYDROXYBUTYRIC ACID - Abnormal; Notable for the following components:   Beta-Hydroxybutyric Acid 0.58 (*)    All other components within normal limits  LACTATE DEHYDROGENASE - Abnormal; Notable for the following components:   LDH 360 (*)    All other components within normal limits  FERRITIN - Abnormal; Notable for the following components:   Ferritin 1,210 (*)    All other components within normal limits  C-REACTIVE PROTEIN - Abnormal; Notable for the following components:   CRP 3.1 (*)    All other components within normal limits  URINE CULTURE  CULTURE, BLOOD (ROUTINE X 2)  CULTURE, BLOOD (ROUTINE  X 2)  SARS CORONAVIRUS 2 (TAT 6-24 HRS)  LACTIC ACID, PLASMA  LIPASE, BLOOD  PROCALCITONIN  MAGNESIUM  TRIGLYCERIDES  FIBRINOGEN  HEPARIN LEVEL (UNFRACTIONATED)  APTT  TSH  HEMOGLOBIN A1C  POC SARS CORONAVIRUS 2 AG -  ED  POC SARS CORONAVIRUS 2 AG   ____________________________________________  EKG  ED ECG REPORT I, Hinda Kehr, the attending physician, personally viewed and interpreted this ECG.  Date: 01/19/2019 EKG Time: 23: 05 Rate: 93 Rhythm: normal sinus rhythm QRS Axis: normal Intervals: Prolonged QTC at 599 ms ST/T Wave abnormalities: Non-specific ST segment / T-wave changes, but no clear evidence of acute ischemia. Narrative Interpretation: no definitive evidence of acute ischemia; does not meet STEMI criteria.   ____________________________________________  RADIOLOGY I, Hinda Kehr, personally discussed these images and results by phone with the on-call radiologist and used this discussion as part of my medical decision making.    ED MD interpretation: Multiple subsegmental pulmonary emboli.  No sign of COVID-19 in the lung parenchyma.  What appears to be liver metastases are present without any clear primary diagnosis  Official radiology report(s): Ct Angio Chest Pe W/cm &/or Wo Cm  Result Date: 01/20/2019 CLINICAL DATA:  Suspected covid, elevated LFTs, recent stroke EXAM: CT ANGIOGRAPHY CHEST, abdomen, and pelvis WITH CONTRAST TECHNIQUE: Multidetector CT imaging of the chest, abdomen, and pelvis was performed using the standard protocol during bolus administration of intravenous contrast. Multiplanar CT image reconstructions and MIPs were obtained to evaluate the vascular anatomy. CONTRAST:  13m OMNIPAQUE IOHEXOL 350 MG/ML SOLN COMPARISON:  CT chest September 13, 2017 FINDINGS: Cardiovascular: There is a optimal opacification of the pulmonary arteries. There  is a small nonocclusive thrombus seen within the left main pulmonary artery which extends into the  anterior left upper lobe segmental branches and partially occlusive thrombus in the posterior left lower lobe subsegmental branches. There is also partially occlusive thrombus seen within the posterior right upper lobe segmental branch, series 6, image 37. There is also partially occlusive thrombus seen extending into the right middle lobe subsegmental branches and posterior right lower lobe subsegmental branches. The heart is normal in size. No pericardial effusion or thickening. There is slight straightening of the interventricular septum which could be due to early right ventricular heart strain. There is normal three-vessel brachiocephalic anatomy without proximal stenosis. Scattered atherosclerosis is noted. The thoracic aorta is normal in appearance. Mediastinum/Nodes: No hilar, mediastinal, or axillary adenopathy. Thyroid gland, trachea, and esophagus demonstrate no significant findings. Lungs/Pleura: Small subcentimeter pulmonary nodule are again identified as on prior exam dating back to 2019, for example in the posterior right upper lobe there is a 5 mm pulmonary nodule, series 8, image 21 and anterior right lower lobe, series 8, image 54 a 4 mm pulmonary nodule. There is however a new small nodular opacity in the posterior left upper lobe, series 8, image 47 measuring 7 mm not seen on prior exam. No large airspace consolidation or pleural effusion is seen. Centrilobular emphysematous changes seen within both lung apices. Musculoskeletal: No chest wall abnormality. No acute or significant osseous findings. Review of the MIP images confirms the above findings. Abdomen/pelvis: Hepatobiliary: There are multiple low-density liver lesions seen throughout which were not seen on the prior exam. There is also heterogeneous enhancement seen throughout the liver parenchyma. Central periportal edema is noted. The portal vein is patent. The patient is status post cholecystectomy. A small amount of perihepatic ascites  is noted. Pancreas: Unremarkable. No pancreatic ductal dilatation or surrounding inflammatory changes. Spleen: Normal in size without focal abnormality. Adrenals/Urinary Tract: Both adrenal glands appear normal. The kidneys and collecting system appear normal without evidence of urinary tract calculus or hydronephrosis. Bladder is unremarkable. Stomach/Bowel: The stomach, small bowel, and colon are normal in appearance. No inflammatory changes, wall thickening, or obstructive findings. Vascular/Lymphatic: There are no enlarged mesenteric, retroperitoneal, or pelvic lymph nodes. Scattered aortic atherosclerotic calcifications are seen without aneurysmal dilatation. Reproductive: The uterus and adnexa are unremarkable. Other: No evidence of abdominal wall mass or hernia. Musculoskeletal: No acute or significant osseous findings. IMPRESSION: 1. Bilateral partially occlusive segmental and subsegmental pulmonary emboli as described above. 2. Findings which could be suggestive of early right ventricular heart strain. 3. Multiple unchanged subcentimeter pulmonary nodules as on prior exam dating back to 2019. 4. New nodular opacity within the posterior left upper lobe measuring 7 mm which is nonspecific, and would recommend attention on short interval follow-up exam. 5. Centrilobular emphysematous changes. 6. Numerous hepatic lesions throughout, likely suggestive of hepatic metastatic disease. 7. Small amount of perihepatic ascites 8. These results were called by telephone at the time of interpretation on 01/20/2019 at 2:07 am to provider [facility] , who verbally acknowledged these results. Electronically Signed   By: Prudencio Pair M.D.   On: 01/20/2019 02:09   Ct Abd/pelvis W/ Iv Contrast  Result Date: 01/20/2019 CLINICAL DATA:  Suspected covid, elevated LFTs, recent stroke EXAM: CT ANGIOGRAPHY CHEST, abdomen, and pelvis WITH CONTRAST TECHNIQUE: Multidetector CT imaging of the chest, abdomen, and pelvis was  performed using the standard protocol during bolus administration of intravenous contrast. Multiplanar CT image reconstructions and MIPs were obtained to evaluate the vascular  anatomy. CONTRAST:  47m OMNIPAQUE IOHEXOL 350 MG/ML SOLN COMPARISON:  CT chest September 13, 2017 FINDINGS: Cardiovascular: There is a optimal opacification of the pulmonary arteries. There is a small nonocclusive thrombus seen within the left main pulmonary artery which extends into the anterior left upper lobe segmental branches and partially occlusive thrombus in the posterior left lower lobe subsegmental branches. There is also partially occlusive thrombus seen within the posterior right upper lobe segmental branch, series 6, image 37. There is also partially occlusive thrombus seen extending into the right middle lobe subsegmental branches and posterior right lower lobe subsegmental branches. The heart is normal in size. No pericardial effusion or thickening. There is slight straightening of the interventricular septum which could be due to early right ventricular heart strain. There is normal three-vessel brachiocephalic anatomy without proximal stenosis. Scattered atherosclerosis is noted. The thoracic aorta is normal in appearance. Mediastinum/Nodes: No hilar, mediastinal, or axillary adenopathy. Thyroid gland, trachea, and esophagus demonstrate no significant findings. Lungs/Pleura: Small subcentimeter pulmonary nodule are again identified as on prior exam dating back to 2019, for example in the posterior right upper lobe there is a 5 mm pulmonary nodule, series 8, image 21 and anterior right lower lobe, series 8, image 54 a 4 mm pulmonary nodule. There is however a new small nodular opacity in the posterior left upper lobe, series 8, image 47 measuring 7 mm not seen on prior exam. No large airspace consolidation or pleural effusion is seen. Centrilobular emphysematous changes seen within both lung apices. Musculoskeletal: No chest wall  abnormality. No acute or significant osseous findings. Review of the MIP images confirms the above findings. Abdomen/pelvis: Hepatobiliary: There are multiple low-density liver lesions seen throughout which were not seen on the prior exam. There is also heterogeneous enhancement seen throughout the liver parenchyma. Central periportal edema is noted. The portal vein is patent. The patient is status post cholecystectomy. A small amount of perihepatic ascites is noted. Pancreas: Unremarkable. No pancreatic ductal dilatation or surrounding inflammatory changes. Spleen: Normal in size without focal abnormality. Adrenals/Urinary Tract: Both adrenal glands appear normal. The kidneys and collecting system appear normal without evidence of urinary tract calculus or hydronephrosis. Bladder is unremarkable. Stomach/Bowel: The stomach, small bowel, and colon are normal in appearance. No inflammatory changes, wall thickening, or obstructive findings. Vascular/Lymphatic: There are no enlarged mesenteric, retroperitoneal, or pelvic lymph nodes. Scattered aortic atherosclerotic calcifications are seen without aneurysmal dilatation. Reproductive: The uterus and adnexa are unremarkable. Other: No evidence of abdominal wall mass or hernia. Musculoskeletal: No acute or significant osseous findings. IMPRESSION: 1. Bilateral partially occlusive segmental and subsegmental pulmonary emboli as described above. 2. Findings which could be suggestive of early right ventricular heart strain. 3. Multiple unchanged subcentimeter pulmonary nodules as on prior exam dating back to 2019. 4. New nodular opacity within the posterior left upper lobe measuring 7 mm which is nonspecific, and would recommend attention on short interval follow-up exam. 5. Centrilobular emphysematous changes. 6. Numerous hepatic lesions throughout, likely suggestive of hepatic metastatic disease. 7. Small amount of perihepatic ascites 8. These results were called by  telephone at the time of interpretation on 01/20/2019 at 2:07 am [facility] who verbally acknowledged these results. Electronically Signed   By: BPrudencio PairM.D.   On: 01/20/2019 02:09   Dg Chest Portable 1 View  Result Date: 01/19/2019 CLINICAL DATA:  Shortness of breath EXAM: PORTABLE CHEST 1 VIEW COMPARISON:  12/31/2018 FINDINGS: Cardiomegaly. Lungs clear. No effusions or edema. No acute bony  abnormality. IMPRESSION: Cardiomegaly.  No active disease. Electronically Signed   By: Rolm Baptise M.D.   On: 01/19/2019 23:33    ____________________________________________   PROCEDURES   Procedure(s) performed (including Critical Care):  .Critical Care Performed by: Hinda Kehr, MD Authorized by: Hinda Kehr, MD   Critical care provider statement:    Critical care time (minutes):  45   Critical care time was exclusive of:  Separately billable procedures and treating other patients   Critical care was necessary to treat or prevent imminent or life-threatening deterioration of the following conditions:  Respiratory failure and circulatory failure (multiple pulmonary emboli resulting in acute respiratory failure with hypoxemia)   Critical care was time spent personally by me on the following activities:  Development of treatment plan with patient or surrogate, discussions with consultants, evaluation of patient's response to treatment, examination of patient, obtaining history from patient or surrogate, ordering and performing treatments and interventions, ordering and review of laboratory studies, ordering and review of radiographic studies, pulse oximetry, re-evaluation of patient's condition and review of old charts     ____________________________________________   INITIAL IMPRESSION / MDM / [place] / ED COURSE  As part of my medical decision making, I reviewed the following data within the [facility] notes reviewed and  incorporated, Labs reviewed , EKG interpreted , Old chart reviewed, Radiograph reviewed , Discussed with intensivist (Dr. Oletta Darter), Discussed with admitting physician (Dr. Marcello Moores), Discussed with radiologist (Dr. Kerby Moors) and reviewed Notes from prior ED visits   Differential diagnosis includes, but is not limited to, deconditioning due to multiple recent acute medical issues as well as multiple comorbidities and chronic illnesses, viral infection including but not limited to COVID-19, healthcare associated pneumonia, healthcare associated urinary tract infection, worsening CVA or acute TIA, pulmonary embolism, less likely ACS.  The patient is in no distress but has some tachypnea at rest.  However it is unclear whether or not this is due to anxiety which she admits feeling or an underlying medical issue.  She has had a very complicated recent medical course and does appear to be debilitated.  She came in on 2 L of oxygen by nasal cannula and we will test her off of the oxygen.  She had a recent negative COVID-19 test and has no known contacts but has been in and out of the hospital.  Given her recent numerous medical issues I am performing a broad evaluation and will comment on the individual results as they come back.  In short after checking a broad spectrum of lab work and doing the rapid POC COVID-19 test, I will also check an MR brain for the possibility of worsening MRI versus expected interval change as well as chest x-ray, EKG, and standard lab work.  Patient understands and agrees with the plan.      Clinical Course as of Jan 20 604  Sun Jan 19, 2019  2354 Of note, we trialed the patient off of oxygen and at rest with absolutely no exertion she dropped her SPO2 to 87%.  She has back on 2 L of oxygen and comfortable.  Highly concerning for COVID-19 versus PE.   [CF]  2354 Cardiomegaly without any obvious infiltrates.  DG Chest Portable 1 View [CF]  Mon Jan 20, 2019  0022 Negative.  However,  the low sensitivity makes his result suspect given the other strongly positive findings as mentioned below.  SARS Coronavirus 2 Ag: NEGATIVE [CF]  0022 Could be elevated due  to COVID, or could represent PE.  Will evaluate with CTA chest to rule out PE.  Fibrin derivatives D-dimer [facility])(!): [id number] [CF]  0024 Substantially elevated LFTs (particularly alk phos) with normal bilirubin.  Unclear significance, again suspect COVID.  Will check CT abd/pelvis when performing CTA chest, but low suspicion for intra-abdominal pathology.   [CF]  0026 Given alternative diagnoses, I will hold off on brain MRI given no new or appreciable neuro deficis at this time.   [CF]  0029 Using low dose IV protocol, verified with CT technologist.  GFR, Est African American(!): 38 [CF]  0032 Lactic Acid, Venous: 1.9 [CF]  0101 Starting IV fluids, holding off on insulin for now  Beta-Hydroxybutyric Acid(!): 0.58 [CF]  0204 I took a call from the radiologist who informed me that the patient has multi segmental pulmonary emboli but without evidence of heart strain.  No findings in the lungs consistent with COVID-19.  The patient also appears to have numerous metastases in the liver that account for her LFTs (and likely also the PEs), but no clear primary source is identified.   [CF]  0223 consult to hospitalist ordered   [CF]  0223 ordered heparin bolus + infusion   [CF]  0241 Holding on heparin until I can speak with the intensivist to discuss risks/benefits given small cerebellar infarction about 19 days ago.  Asked Network engineer to page intensivist MD   [CF]  [phone] I spoke by phone with Dr. Oletta Darter with the ICU.  We discussed the case in detail and he agrees that the risks of not treating the PE outweigh the risk of hemorrhagic conversion.  He recommended stepdown bed at most but does not feel like she requires an ICU level of care at this time.  I agree with the stepdown assessment.  I am awaiting callback from the  hospitalist.   [CF]  0255 I discussed the case by phone with Dr. Marcello Moores with the hospitalist service.  We discussed the case in detail including the intensivist discussion, risk benefits for heparin, etc., and she will admit the patient to a stepdown bed understanding that the patient will be staying in the emergency department until an ICU bed is open.   [CF]  0303 Of note, I had a discussion with the patient and explained her diagnoses as well as the concern for previously undiagnosed cancer.  I explained the risks and benefits associated with treating her pulmonary emboli with heparin given the cerebellar infarction just under 3 weeks ago and explained that it can lead to bleeding in the brain.  But I also explained that she has a severe and potentially life-threatening acute emergent condition, the pulmonary emboli, which need to be treated to try and prevent them from getting any worse.  She says that she understands and agrees that we should treat the pulmonary emboli now rather than to risk of not treating it for fear of the possibility of creating a bleed in her brain.   [CF]    Clinical Course User Index [CF] Hinda Kehr, MD     ____________________________________________  FINAL CLINICAL IMPRESSION(S) / ED DIAGNOSES  Final diagnoses:  Multiple subsegmental pulmonary emboli without acute cor pulmonale (HCC)  Acute respiratory failure with hypoxemia (HCC)  Elevated LFTs  Liver metastases (HCC)     MEDICATIONS GIVEN DURING THIS VISIT:  Medications  0.9 %  sodium chloride infusion (has no administration in time range)  heparin bolus via infusion 4,000 Units (has no administration in time range)  Followed by  heparin ADULT infusion 100 units/mL (25000 units/236m sodium chloride 0.45%) (has no administration in time range)  sodium chloride 0.9 % bolus 500 mL (500 mLs Intravenous New Bag/Given 01/20/19 0223)  iohexol (OMNIPAQUE) 350 MG/ML injection 75 mL (75 mLs Intravenous  Contrast Given 01/20/19 0133)     ED Discharge Orders    None      *Please note:  Audrey BOSTICwas evaluated in Emergency Department on 01/20/2019 for the symptoms described in the history of present illness. She was evaluated in the context of the global COVID-19 pandemic, which necessitated consideration that the patient might be at risk for infection with the SARS-CoV-2 virus that causes COVID-19. Institutional protocols and algorithms that pertain to the evaluation of patients at risk for COVID-19 are in a state of rapid change based on information released by regulatory bodies including the CDC and federal and state organizations. These policies and algorithms were followed during the patient's care in the ED.  Some ED evaluations and interventions may be delayed as a result of limited staffing during the pandemic.*  Note:  This document was prepared using Dragon voice recognition software and may include unintentional dictation errors.   FHinda Kehr MD 01/20/19 [phone]

## 2019-01-19 NOTE — ED Notes (Signed)
EDP at bedside  

## 2019-01-19 NOTE — ED Triage Notes (Signed)
Pt arrives via EMS from home with dizziness and SHOB since Wednesday- pt states it only occurs with exertion- pt went to see her neurologist about it and was told to go home and get some rest- VS per EMS as folows: 124/74 BP, 90s HR, 96% RA, cbg 199

## 2019-01-19 NOTE — ED Notes (Signed)
Pt trialed off of nasal canula per MD request while this RN drew blood work.  While lying in bed patient oxygen sats dropped to 87% RA, denied feeling SOB.  Patient placed back on 2L [place] and sats up to 96%.  MD notified.

## 2019-01-20 ENCOUNTER — Emergency Department: Payer: Medicare PPO

## 2019-01-20 ENCOUNTER — Inpatient Hospital Stay: Admit: 2019-01-20 | Discharge: 2019-01-20 | Disposition: A | Discharge status: Home / Self Care | Payer: Medicare PPO

## 2019-01-20 ENCOUNTER — Inpatient Hospital Stay: Payer: Medicare PPO

## 2019-01-20 ENCOUNTER — Encounter: Payer: Self-pay | Admitting: Radiology

## 2019-01-20 DIAGNOSIS — Z801 Family history of malignant neoplasm of trachea, bronchus and lung: Secondary | ICD-10-CM | POA: Diagnosis not present

## 2019-01-20 DIAGNOSIS — E1142 Type 2 diabetes mellitus with diabetic polyneuropathy: Secondary | ICD-10-CM | POA: Diagnosis present

## 2019-01-20 DIAGNOSIS — Z821 Family history of blindness and visual loss: Secondary | ICD-10-CM | POA: Diagnosis not present

## 2019-01-20 DIAGNOSIS — Z20828 Contact with and (suspected) exposure to other viral communicable diseases: Secondary | ICD-10-CM | POA: Diagnosis present

## 2019-01-20 DIAGNOSIS — R7989 Other specified abnormal findings of blood chemistry: Secondary | ICD-10-CM | POA: Diagnosis not present

## 2019-01-20 DIAGNOSIS — J432 Centrilobular emphysema: Secondary | ICD-10-CM | POA: Diagnosis present

## 2019-01-20 DIAGNOSIS — E1122 Type 2 diabetes mellitus with diabetic chronic kidney disease: Secondary | ICD-10-CM | POA: Diagnosis present

## 2019-01-20 DIAGNOSIS — C787 Secondary malignant neoplasm of liver and intrahepatic bile duct: Secondary | ICD-10-CM | POA: Diagnosis present

## 2019-01-20 DIAGNOSIS — I2699 Other pulmonary embolism without acute cor pulmonale: Secondary | ICD-10-CM | POA: Diagnosis present

## 2019-01-20 DIAGNOSIS — D509 Iron deficiency anemia, unspecified: Secondary | ICD-10-CM | POA: Diagnosis present

## 2019-01-20 DIAGNOSIS — G629 Polyneuropathy, unspecified: Secondary | ICD-10-CM | POA: Diagnosis present

## 2019-01-20 DIAGNOSIS — Z794 Long term (current) use of insulin: Secondary | ICD-10-CM | POA: Diagnosis not present

## 2019-01-20 DIAGNOSIS — G894 Chronic pain syndrome: Secondary | ICD-10-CM | POA: Diagnosis present

## 2019-01-20 DIAGNOSIS — N183 Chronic kidney disease, stage 3 unspecified: Secondary | ICD-10-CM

## 2019-01-20 DIAGNOSIS — R9431 Abnormal electrocardiogram [ECG] [EKG]: Secondary | ICD-10-CM

## 2019-01-20 DIAGNOSIS — N1832 Chronic kidney disease, stage 3b: Secondary | ICD-10-CM | POA: Diagnosis present

## 2019-01-20 DIAGNOSIS — E785 Hyperlipidemia, unspecified: Secondary | ICD-10-CM | POA: Diagnosis present

## 2019-01-20 DIAGNOSIS — Z7902 Long term (current) use of antithrombotics/antiplatelets: Secondary | ICD-10-CM | POA: Diagnosis not present

## 2019-01-20 DIAGNOSIS — J9601 Acute respiratory failure with hypoxia: Secondary | ICD-10-CM | POA: Diagnosis present

## 2019-01-20 DIAGNOSIS — Z825 Family history of asthma and other chronic lower respiratory diseases: Secondary | ICD-10-CM | POA: Diagnosis not present

## 2019-01-20 DIAGNOSIS — I2694 Multiple subsegmental pulmonary emboli without acute cor pulmonale: Secondary | ICD-10-CM | POA: Diagnosis present

## 2019-01-20 DIAGNOSIS — Z8249 Family history of ischemic heart disease and other diseases of the circulatory system: Secondary | ICD-10-CM | POA: Diagnosis not present

## 2019-01-20 DIAGNOSIS — I272 Pulmonary hypertension, unspecified: Secondary | ICD-10-CM | POA: Diagnosis present

## 2019-01-20 DIAGNOSIS — I82403 Acute embolism and thrombosis of unspecified deep veins of lower extremity, bilateral: Secondary | ICD-10-CM | POA: Diagnosis present

## 2019-01-20 DIAGNOSIS — Z87891 Personal history of nicotine dependence: Secondary | ICD-10-CM | POA: Diagnosis not present

## 2019-01-20 DIAGNOSIS — D631 Anemia in chronic kidney disease: Secondary | ICD-10-CM | POA: Diagnosis present

## 2019-01-20 DIAGNOSIS — I129 Hypertensive chronic kidney disease with stage 1 through stage 4 chronic kidney disease, or unspecified chronic kidney disease: Secondary | ICD-10-CM | POA: Diagnosis present

## 2019-01-20 DIAGNOSIS — Z7982 Long term (current) use of aspirin: Secondary | ICD-10-CM | POA: Diagnosis not present

## 2019-01-20 HISTORY — DX: Multiple subsegmental thrombotic pulmonary emboli without acute cor pulmonale: I26.94

## 2019-01-20 LAB — HEPARIN LEVEL (UNFRACTIONATED)
Heparin Unfractionated: 0.6 IU/mL (ref 0.30–0.70)
Heparin Unfractionated: 0.48 IU/mL (ref 0.30–0.70)

## 2019-01-20 LAB — COMPREHENSIVE METABOLIC PANEL
ALT: 118 U/L — ABNORMAL HIGH (ref 0–44)
ALT: 83 U/L — ABNORMAL HIGH (ref 0–44)
AST: 145 U/L — ABNORMAL HIGH (ref 15–41)
AST: 82 U/L — ABNORMAL HIGH (ref 15–41)
Albumin: 3.7 g/dL (ref 3.5–5.0)
Albumin: 3.1 g/dL — ABNORMAL LOW (ref 3.5–5.0)
Alkaline Phosphatase: 1044 U/L — ABNORMAL HIGH (ref 38–126)
Alkaline Phosphatase: 851 U/L — ABNORMAL HIGH (ref 38–126)
Anion gap: 16 — ABNORMAL HIGH (ref 5–15)
Anion gap: 11 (ref 5–15)
BUN: 28 mg/dL — ABNORMAL HIGH (ref 8–23)
BUN: 24 mg/dL — ABNORMAL HIGH (ref 8–23)
CO2: 17 mmol/L — ABNORMAL LOW (ref 22–32)
CO2: 20 mmol/L — ABNORMAL LOW (ref 22–32)
Calcium: 9.2 mg/dL (ref 8.9–10.3)
Calcium: 8.8 mg/dL — ABNORMAL LOW (ref 8.9–10.3)
Chloride: 97 mmol/L — ABNORMAL LOW (ref 98–111)
Chloride: 104 mmol/L (ref 98–111)
Creatinine, Ser: 1.58 mg/dL — ABNORMAL HIGH (ref 0.44–1.00)
Creatinine, Ser: 1.33 mg/dL — ABNORMAL HIGH (ref 0.44–1.00)
GFR calc Af Amer: 38 mL/min — ABNORMAL LOW (ref >60)
GFR calc Af Amer: 47 mL/min — ABNORMAL LOW (ref >60)
GFR calc non Af Amer: 33 mL/min — ABNORMAL LOW (ref >60)
GFR calc non Af Amer: 41 mL/min — ABNORMAL LOW (ref >60)
Glucose, Bld: 181 mg/dL — ABNORMAL HIGH (ref 70–99)
Glucose, Bld: 121 mg/dL — ABNORMAL HIGH (ref 70–99)
Potassium: 4.7 mmol/L (ref 3.5–5.1)
Potassium: 4.8 mmol/L (ref 3.5–5.1)
Sodium: 130 mmol/L — ABNORMAL LOW (ref 135–145)
Sodium: 135 mmol/L (ref 135–145)
Total Bilirubin: 0.6 mg/dL (ref 0.3–1.2)
Total Bilirubin: 0.5 mg/dL (ref 0.3–1.2)
Total Protein: 8.6 g/dL — ABNORMAL HIGH (ref 6.5–8.1)
Total Protein: 7.3 g/dL (ref 6.5–8.1)

## 2019-01-20 LAB — URINALYSIS, COMPLETE (UACMP) WITH MICROSCOPIC
Bacteria, UA: NONE SEEN
Bilirubin Urine: NEGATIVE
Glucose, UA: NEGATIVE mg/dL
Hgb urine dipstick: NEGATIVE
Ketones, ur: NEGATIVE mg/dL
Leukocytes,Ua: NEGATIVE
Nitrite: NEGATIVE
Protein, ur: 30 mg/dL — AB
Specific Gravity, Urine: 1.013 (ref 1.005–1.030)
pH: 5 (ref 5.0–8.0)

## 2019-01-20 LAB — BETA-HYDROXYBUTYRIC ACID
Beta-Hydroxybutyric Acid: 0.58 mmol/L — ABNORMAL HIGH (ref 0.05–0.27)
Beta-Hydroxybutyric Acid: 0.44 mmol/L — ABNORMAL HIGH (ref 0.05–0.27)

## 2019-01-20 LAB — SARS CORONAVIRUS 2 (TAT 6-24 HRS): SARS Coronavirus 2: NEGATIVE

## 2019-01-20 LAB — HEMOGLOBIN A1C
Hgb A1c MFr Bld: 9.5 % — ABNORMAL HIGH (ref 4.8–5.6)
Mean Plasma Glucose: 225.95 mg/dL

## 2019-01-20 LAB — FIBRINOGEN: Fibrinogen: 239 mg/dL (ref 210–475)

## 2019-01-20 LAB — TRIGLYCERIDES: Triglycerides: 136 mg/dL (ref <150)

## 2019-01-20 LAB — APTT: aPTT: 119 seconds — ABNORMAL HIGH (ref 24–36)

## 2019-01-20 LAB — TSH: TSH: 3.871 u[IU]/mL (ref 0.350–4.500)

## 2019-01-20 LAB — FERRITIN: Ferritin: 1210 ng/mL — ABNORMAL HIGH (ref 11–307)

## 2019-01-20 LAB — ECHOCARDIOGRAM COMPLETE
Height: 66 in
Weight: 1952 oz

## 2019-01-20 LAB — BRAIN NATRIURETIC PEPTIDE
B Natriuretic Peptide: 876 pg/mL — ABNORMAL HIGH (ref 0.0–100.0)
B Natriuretic Peptide: 893 pg/mL — ABNORMAL HIGH (ref 0.0–100.0)

## 2019-01-20 LAB — PROTIME-INR
INR: 1.3 — ABNORMAL HIGH (ref 0.8–1.2)
Prothrombin Time: 15.8 seconds — ABNORMAL HIGH (ref 11.4–15.2)

## 2019-01-20 LAB — LIPASE, BLOOD: Lipase: 15 U/L (ref 11–51)

## 2019-01-20 LAB — TROPONIN I (HIGH SENSITIVITY)
Troponin I (High Sensitivity): 2101 ng/L (ref <18)
Troponin I (High Sensitivity): 2019 ng/L (ref <18)

## 2019-01-20 LAB — GLUCOSE, CAPILLARY
Glucose-Capillary: 112 mg/dL — ABNORMAL HIGH (ref 70–99)
Glucose-Capillary: 104 mg/dL — ABNORMAL HIGH (ref 70–99)
Glucose-Capillary: 203 mg/dL — ABNORMAL HIGH (ref 70–99)
Glucose-Capillary: 97 mg/dL (ref 70–99)

## 2019-01-20 LAB — POC SARS CORONAVIRUS 2 AG -  ED: SARS Coronavirus 2 Ag: NEGATIVE

## 2019-01-20 LAB — LACTATE DEHYDROGENASE: LDH: 360 U/L — ABNORMAL HIGH (ref 98–192)

## 2019-01-20 LAB — POC SARS CORONAVIRUS 2 AG: SARS Coronavirus 2 Ag: NEGATIVE

## 2019-01-20 LAB — MAGNESIUM: Magnesium: 2 mg/dL (ref 1.7–2.4)

## 2019-01-20 LAB — C-REACTIVE PROTEIN: CRP: 3.1 mg/dL — ABNORMAL HIGH (ref <1.0)

## 2019-01-20 LAB — PROCALCITONIN: Procalcitonin: 0.33 ng/mL

## 2019-01-20 LAB — FIBRIN DERIVATIVES D-DIMER (ARMC ONLY): Fibrin derivatives D-dimer (ARMC): 6622.44 ng/mL (FEU) — ABNORMAL HIGH (ref 0.00–499.00)

## 2019-01-20 MED ORDER — ROSUVASTATIN CALCIUM 10 MG PO TABS
20.0000 mg | ORAL_TABLET | Freq: Every day | ORAL | Status: DC
  Administered 2019-01-21 – 2019-01-23 (×3): 20 mg via ORAL
  Filled 2019-01-20 (×2): qty 2
  Filled 2019-01-20: qty 1

## 2019-01-20 MED ORDER — GABAPENTIN 400 MG PO CAPS
800.0000 mg | ORAL_CAPSULE | Freq: Three times a day (TID) | ORAL | Status: DC
  Administered 2019-01-20 – 2019-01-21 (×2): 800 mg via ORAL
  Filled 2019-01-20 (×4): qty 2

## 2019-01-20 MED ORDER — ONDANSETRON HCL 4 MG PO TABS: 4.0000 mg | ORAL_TABLET | Freq: Four times a day (QID) | ORAL | Status: DC | PRN

## 2019-01-20 MED ORDER — LORATADINE 10 MG PO TABS
10.0000 mg | ORAL_TABLET | Freq: Every day | ORAL | Status: DC
  Administered 2019-01-20 – 2019-01-25 (×6): 10 mg via ORAL
  Filled 2019-01-20 (×7): qty 1

## 2019-01-20 MED ORDER — FERROUS SULFATE 325 (65 FE) MG PO TABS
325.0000 mg | ORAL_TABLET | Freq: Two times a day (BID) | ORAL | Status: DC
  Administered 2019-01-20 – 2019-01-25 (×9): 325 mg via ORAL
  Filled 2019-01-20 (×12): qty 1

## 2019-01-20 MED ORDER — UMECLIDINIUM-VILANTEROL 62.5-25 MCG/INH IN AEPB
1.0000 | INHALATION_SPRAY | Freq: Every day | RESPIRATORY_TRACT | Status: DC
  Administered 2019-01-21 – 2019-01-24 (×2): 1 via RESPIRATORY_TRACT
  Filled 2019-01-20 (×3): qty 14

## 2019-01-20 MED ORDER — HEPARIN BOLUS VIA INFUSION
4000.0000 [IU] | Freq: Once | INTRAVENOUS | Status: AC
  Administered 2019-01-20: 04:00:00 4000 [IU] via INTRAVENOUS
  Filled 2019-01-20: qty 4000

## 2019-01-20 MED ORDER — ALBUTEROL SULFATE HFA 108 (90 BASE) MCG/ACT IN AERS
2.0000 | INHALATION_SPRAY | Freq: Four times a day (QID) | RESPIRATORY_TRACT | Status: DC | PRN
  Filled 2019-01-20: qty 6.7

## 2019-01-20 MED ORDER — HEPARIN SODIUM (PORCINE) 5000 UNIT/ML IJ SOLN: 60.0000 [IU]/kg | Freq: Once | INTRAMUSCULAR | Status: DC

## 2019-01-20 MED ORDER — LISINOPRIL 10 MG PO TABS
20.0000 mg | ORAL_TABLET | Freq: Every day | ORAL | Status: DC
  Filled 2019-01-20 (×2): qty 2

## 2019-01-20 MED ORDER — FLUTICASONE PROPIONATE 50 MCG/ACT NA SUSP
2.0000 | Freq: Every day | NASAL | Status: DC | PRN
  Filled 2019-01-20: qty 16

## 2019-01-20 MED ORDER — ACETAMINOPHEN 650 MG RE SUPP: 650.0000 mg | Freq: Four times a day (QID) | RECTAL | Status: DC | PRN

## 2019-01-20 MED ORDER — POLYETHYLENE GLYCOL 3350 17 G PO PACK
1.0000 | PACK | Freq: Every day | ORAL | Status: DC | PRN
  Filled 2019-01-20: qty 1

## 2019-01-20 MED ORDER — VITAMIN D3 25 MCG (1000 UNIT) PO TABS
1000.0000 [IU] | ORAL_TABLET | Freq: Every day | ORAL | Status: DC
  Administered 2019-01-20 – 2019-01-25 (×6): 1000 [IU] via ORAL
  Filled 2019-01-20 (×12): qty 1

## 2019-01-20 MED ORDER — HEPARIN (PORCINE) 25000 UT/250ML-% IV SOLN
1200.0000 [IU]/h | INTRAVENOUS | Status: DC
  Administered 2019-01-20 – 2019-01-22 (×2): 950 [IU]/h via INTRAVENOUS
  Filled 2019-01-20 (×4): qty 250

## 2019-01-20 MED ORDER — METOPROLOL TARTRATE 25 MG PO TABS
12.5000 mg | ORAL_TABLET | Freq: Two times a day (BID) | ORAL | Status: DC
  Administered 2019-01-21 – 2019-01-25 (×8): 12.5 mg via ORAL
  Filled 2019-01-20 (×15): qty 1

## 2019-01-20 MED ORDER — GABAPENTIN 400 MG PO CAPS
800.0000 mg | ORAL_CAPSULE | Freq: Three times a day (TID) | ORAL | Status: DC
  Administered 2019-01-20: 800 mg via ORAL
  Filled 2019-01-20 (×4): qty 2

## 2019-01-20 MED ORDER — SODIUM CHLORIDE 0.9 % IV BOLUS
500.0000 mL | Freq: Once | INTRAVENOUS | Status: AC
  Administered 2019-01-20: 500 mL via INTRAVENOUS

## 2019-01-20 MED ORDER — INSULIN ASPART 100 UNIT/ML ~~LOC~~ SOLN
0.0000 [IU] | Freq: Three times a day (TID) | SUBCUTANEOUS | Status: DC
  Administered 2019-01-20: 3 [IU] via SUBCUTANEOUS
  Administered 2019-01-21: 5 [IU] via SUBCUTANEOUS
  Administered 2019-01-22: 3 [IU] via SUBCUTANEOUS
  Filled 2019-01-20 (×3): qty 1

## 2019-01-20 MED ORDER — AMLODIPINE BESYLATE 5 MG PO TABS
5.0000 mg | ORAL_TABLET | Freq: Every day | ORAL | Status: DC
  Administered 2019-01-22 – 2019-01-25 (×4): 5 mg via ORAL
  Filled 2019-01-20 (×6): qty 1

## 2019-01-20 MED ORDER — ADULT MULTIVITAMIN W/MINERALS CH
1.0000 | ORAL_TABLET | Freq: Every day | ORAL | Status: DC
  Administered 2019-01-20 – 2019-01-25 (×6): 1 via ORAL
  Filled 2019-01-20 (×7): qty 1

## 2019-01-20 MED ORDER — SODIUM CHLORIDE 0.9 % IV SOLN
Freq: Once | INTRAVENOUS | Status: AC
  Administered 2019-01-20: 04:00:00 via INTRAVENOUS

## 2019-01-20 MED ORDER — ACETAMINOPHEN 325 MG PO TABS
650.0000 mg | ORAL_TABLET | Freq: Four times a day (QID) | ORAL | Status: DC | PRN
  Administered 2019-01-24: 650 mg via ORAL
  Filled 2019-01-20: qty 2

## 2019-01-20 MED ORDER — INSULIN GLARGINE 100 UNIT/ML ~~LOC~~ SOLN
25.0000 [IU] | Freq: Every day | SUBCUTANEOUS | Status: DC
  Administered 2019-01-20: 25 [IU] via SUBCUTANEOUS
  Filled 2019-01-20 (×2): qty 0.25

## 2019-01-20 MED ORDER — ONDANSETRON HCL 4 MG/2ML IJ SOLN: 4.0000 mg | Freq: Four times a day (QID) | INTRAMUSCULAR | Status: DC | PRN

## 2019-01-20 MED ORDER — SODIUM CHLORIDE 0.9 % IV SOLN
INTRAVENOUS | Status: DC
  Administered 2019-01-20: 12:00:00 via INTRAVENOUS

## 2019-01-20 MED ORDER — SODIUM CHLORIDE 0.9 % IV BOLUS: 500.0000 mL | Freq: Once | INTRAVENOUS | Status: DC

## 2019-01-20 MED ORDER — IOHEXOL 350 MG/ML SOLN
75.0000 mL | Freq: Once | INTRAVENOUS | Status: AC | PRN
  Administered 2019-01-20: 75 mL via INTRAVENOUS

## 2019-01-20 NOTE — Progress Notes (Signed)
PT Cancellation Note  Patient Details Name: Audrey Chan MRN: [id number] DOB: 26-Jan-1951   Cancelled Treatment:    Reason Eval/Treat Not Completed: Other (comment). Consult received and chart reviewed. Pt currently with active PE, started on anticoag at 0340 this date. Per protocol, will hold for 48 hours prior to initiating PT evaluation. Will re-attempt on Wednesday.   [name] 01/20/2019, 10:59 AM Greggory Stallion, PT, DPT [phone]

## 2019-01-20 NOTE — Progress Notes (Signed)
OT Cancellation Note  Patient Details Name: Audrey Chan MRN: [id number] DOB: [birth year]   Cancelled Treatment:    Reason Eval/Treat Not Completed: Patient not medically ready. Consult received, chart reviewed. Pt currently with active PE, started on anticoag at 0340 this date. Per protocol, will hold for 48 hours prior to initiating OT evaluation. Will re-attempt on Wednesday as medically appropriate.   Jeni Salles, MPH, MS, OTR/L ascom [phone] 01/20/19, 11:44 AM

## 2019-01-20 NOTE — ED Notes (Signed)
ED Provider at bedside. 

## 2019-01-20 NOTE — Consult Note (Signed)
Pulmonary Medicine          Date: 01/20/2019,   MRN# [id number] BIRDIA BEOUGHER [birth year]     AdmissionWeight: 55.3 kg                 CurrentWeight: 55.3 kg  Referring physician: Dr. Sarajane Jews    CHIEF COMPLAINT:   Abnormal CT chest   HISTORY OF PRESENT ILLNESS   This is a pleasant 68 year old female with a history of essential hypertension, history of ischemic CVA, diabetes, diabetic neuropathy, chronic [place], chronic pain syndrome, transaminitis, dyslipidemia, who came in to the emergency department with complaints of worsening shortness of breath over the past 1 week. She has a hx of smoking from age 69 to 36 smoked appx 1/2 pack per day for total appx 20-25 pack years.  She denies having flulike symptoms including myalgias and malaise.  She did notice that activity made dyspnea worse.  In the ER saturation was 84% on room air which improved with supplemental oxygen.  She had CT chest performed with pictorial documentation included below.  CT chest was positive for bilateral pulmonary embolism nonmassive and nonocclusive with mild RV strain, serology with mildly elevated high-sensitivity troponin, vitals with no hemodynamic instability, mild tachycardia and acceptable saturation on 4 L nasal cannula.   PAST MEDICAL HISTORY   Past Medical History:  Diagnosis Date   Allergic rhinitis    Aneurysm of unspecified site ([place])    Cholelithiasis    Diabetes type 2, controlled ([place])    GERD (gastroesophageal reflux disease)    Hypertension    Neuropathy    Veno-occlusive disease      SURGICAL HISTORY   Past Surgical History:  Procedure Laterality Date   ABDOMINAL HYSTERECTOMY  1972   CERVICAL FUSION     CHOLECYSTECTOMY N/A 12/28/2014   Procedure: LAPAROSCOPIC CHOLECYSTECTOMY;  Surgeon: Sherri Rad, MD;  Location: [facility] ORS;  Service: General;  Laterality: N/A;   EYE SURGERY     detached retina   MASS EXCISION Left 05/06/2018   Procedure: EXCISION  OF LEFT ANTECUBITAL CYST, DIABETIC;  Surgeon: Vickie Epley, MD;  Location: [facility] ORS;  Service: Vascular;  Laterality: Left;     FAMILY HISTORY   Family History  Problem Relation Age of Onset   Lung cancer Father      SOCIAL HISTORY   Social History   Tobacco Use   Smoking status: Former Smoker    Quit date: 02/21/1999    Years since quitting: 19.9   Smokeless tobacco: Never Used  Substance Use Topics   Alcohol use: No    Alcohol/week: 0.0 standard drinks   Drug use: No     MEDICATIONS    Home Medication:  Current Outpatient Rx   Order #: [id number] Class: Historical Med   Order #: [id number] Class: Historical Med   Order #: [id number] Class: Print   Order #: [id number] Class: Historical Med   Order #: [id number] Class: Historical Med   Order #: [id number] Class: Historical Med   Order #: [id number] Class: Historical Med   Order #: [id number] Class: Historical Med   Order #: [id number] Class: Historical Med   Order #: [id number] Class: Historical Med   Order #: [id number] Class: Historical Med   Order #: [id number] Class: Print   Order #: [id number] Class: Print   Order #: [id number] Class: Normal   Order #: [id number] Class: Historical Med   Order #: [id number] Class: Historical Med   Order #: [id number] Class: Historical Med   Order #: [id number] Class: Historical Med   Order #: [id number] Class: Historical Med  Order #: [id number] Class: Historical Med   Order #: [id number] Class: Historical Med   Order #: [id number] Class: Historical Med   Order #: [id number] Class: Historical Med    Current Medication:  Current Facility-Administered Medications:    acetaminophen (TYLENOL) tablet 650 mg, 650 mg, Oral, Q6H PRN **OR** acetaminophen (TYLENOL) suppository 650 mg, 650 mg, Rectal, Q6H PRN, Clance Boll, MD   albuterol (VENTOLIN HFA) 108 (90 Base) MCG/ACT inhaler 2 puff, 2 puff, Inhalation, Q6H PRN, Clance Boll, MD   amLODipine (NORVASC) tablet 5 mg, 5  mg, Oral, Daily, Myles Rosenthal A, MD   cholecalciferol (VITAMIN D) tablet 1,000 Units, 1,000 Units, Oral, Daily, Myles Rosenthal A, MD, 1,000 Units at 01/20/19 1353   ferrous sulfate tablet 325 mg, 325 mg, Oral, BID WC, Myles Rosenthal A, MD, 325 mg at 01/20/19 1522   fluticasone (FLONASE) 50 MCG/ACT nasal spray 2 spray, 2 spray, Each Nare, Daily PRN, Clance Boll, MD   gabapentin (NEURONTIN) capsule 800 mg, 800 mg, Oral, TID, Samuella Cota, MD   [COMPLETED] heparin bolus via infusion 4,000 Units, 4,000 Units, Intravenous, Once, 4,000 Units at 01/20/19 0340 **FOLLOWED BY** heparin ADULT infusion 100 units/mL (25000 units/230mL sodium chloride 0.45%), 950 Units/hr, Intravenous, Continuous, Hall, Scott A, RPH, Last Rate: 9.5 mL/hr at 01/20/19 0340, 950 Units/hr at 01/20/19 0340   insulin aspart (novoLOG) injection 0-9 Units, 0-9 Units, Subcutaneous, TID WC, Myles Rosenthal A, MD   insulin glargine (LANTUS) injection 25 Units, 25 Units, Subcutaneous, QHS, Myles Rosenthal A, MD   lisinopril (ZESTRIL) tablet 20 mg, 20 mg, Oral, Daily, Clance Boll, MD   loratadine (CLARITIN) tablet 10 mg, 10 mg, Oral, Daily, Myles Rosenthal A, MD, 10 mg at 01/20/19 1205   metoprolol tartrate (LOPRESSOR) tablet 12.5 mg, 12.5 mg, Oral, BID, Clance Boll, MD   multivitamin with minerals tablet 1 tablet, 1 tablet, Oral, Daily, Myles Rosenthal A, MD, 1 tablet at 01/20/19 1205   polyethylene glycol (MIRALAX / GLYCOLAX) packet 17 g, 1 packet, Oral, Daily PRN, Clance Boll, MD   [START ON 01/21/2019] rosuvastatin (CRESTOR) tablet 20 mg, 20 mg, Oral, Daily, Clance Boll, MD   [START ON 01/21/2019] umeclidinium-vilanterol (ANORO ELLIPTA) 62.5-25 MCG/INH 1 puff, 1 puff, Inhalation, Daily, Clance Boll, MD  Current Outpatient Medications:    acetaminophen (TYLENOL) 500 MG tablet, Take 500-1,000 mg by mouth every 6 (six) hours as needed for moderate pain. ,  Disp: , Rfl:    albuterol (PROVENTIL HFA;VENTOLIN HFA) 108 (90 BASE) MCG/ACT inhaler, Inhale 2 puffs into the lungs every 6 (six) hours as needed for wheezing or shortness of breath. , Disp: , Rfl:    amLODipine (NORVASC) 5 MG tablet, Take 1 tablet (5 mg total) by mouth daily., Disp: 30 tablet, Rfl: 0   aspirin EC 81 MG tablet, Take 81 mg by mouth daily., Disp: , Rfl:    cetirizine (ZYRTEC) 10 MG tablet, Take 10 mg by mouth daily. , Disp: , Rfl:    Cholecalciferol (VITAMIN D3) 1000 units CAPS, Take 1,000 Units by mouth daily. , Disp: , Rfl:    clopidogrel (PLAVIX) 75 MG tablet, Take 75 mg by mouth daily., Disp: , Rfl:    ferrous sulfate 325 (65 FE) MG tablet, Take 325 mg by mouth 2 (two) times daily with a meal. , Disp: , Rfl:    fluticasone (FLONASE) 50 MCG/ACT nasal spray, Place 2 sprays into both nostrils daily as needed for allergies or rhinitis. , Disp: , Rfl:  gabapentin (NEURONTIN) 800 MG tablet, Take 800 mg by mouth 3 (three) times daily. , Disp: , Rfl:    hydrochlorothiazide (HYDRODIURIL) 25 MG tablet, Take 25 mg by mouth daily., Disp: , Rfl:    insulin aspart (NOVOLOG) 100 UNIT/ML injection, Check blood sugar prior to meals and give insulin on the following scale: Blood sugar less than 150: 0 units BS 150-200: 2 units BS 200-250: 3 units BS 250-300: 4 units BS 300-350: 5 units BS 350-400: 6 units BS above 400: 8 units and call your doctor., Disp: 10 mL, Rfl: 1   LANTUS SOLOSTAR 100 UNIT/ML Solostar Pen, Inject 25 Units into the skin at bedtime., Disp: 15 mL, Rfl: 0   lisinopril (ZESTRIL) 10 MG tablet, Take 2 tablets (20 mg total) by mouth daily., Disp: 30 tablet, Rfl: 0   metFORMIN (GLUCOPHAGE-XR) 500 MG 24 hr tablet, Take 1,000 mg by mouth daily. , Disp: , Rfl:    metoprolol tartrate (LOPRESSOR) 25 MG tablet, Take 12.5 mg by mouth 2 (two) times daily., Disp: , Rfl:    Multiple Vitamin (MULTI-VITAMINS) TABS, Take 1 tablet by mouth daily., Disp: , Rfl:    polyethylene  glycol powder (GLYCOLAX/MIRALAX) powder, Take 0.5 Containers by mouth daily as needed for moderate constipation. , Disp: , Rfl:    umeclidinium-vilanterol (ANORO ELLIPTA) 62.5-25 MCG/INH AEPB, Inhale 1 puff into the lungs daily., Disp: , Rfl:    APIDRA SOLOSTAR 100 UNIT/ML Solostar Pen, Inject 0-50 Units into the skin., Disp: , Rfl:    diclofenac (FLECTOR) 1.3 % PTCH, Apply 1 patch topically 2 (two) times daily. Apply 1 patch twice a day., Disp: , Rfl:    mometasone (ELOCON) 0.1 % cream, Apply 1 application topically 2 (two) times daily as needed (rash). , Disp: , Rfl:    rosuvastatin (CRESTOR) 20 MG tablet, Take 20 mg by mouth daily. , Disp: , Rfl:     ALLERGIES   Patient has no known allergies.     REVIEW OF SYSTEMS    Review of Systems:  Gen:  Denies  fever, sweats, chills weigh loss  HEENT: Denies blurred vision, double vision, ear pain, eye pain, hearing loss, nose bleeds, sore throat Cardiac:  No dizziness, chest pain or heaviness, chest tightness,edema Resp:   Denies cough or sputum porduction, shortness of breath,wheezing, hemoptysis,  Gi: Denies swallowing difficulty, stomach pain, nausea or vomiting, diarrhea, constipation, bowel incontinence Gu:  Denies bladder incontinence, burning urine Ext:   Denies Joint pain, stiffness or swelling Skin: Denies  skin rash, easy bruising or bleeding or hives Endoc:  Denies polyuria, polydipsia , polyphagia or weight change Psych:   Denies depression, insomnia or hallucinations   Other:  All other systems negative   VS: BP 129/73    Pulse (!) 109    Temp 97.9 F (36.6 C) (Oral)    Resp 17    Ht 5\' 6"  (1.676 m)    Wt 55.3 kg    SpO2 93%    BMI 19.69 kg/m      PHYSICAL EXAM    GENERAL:NAD, no fevers, chills, no weakness no fatigue HEAD: Normocephalic, atraumatic.  EYES: Pupils equal, round, reactive to light. Extraocular muscles intact. No scleral icterus.  MOUTH: Moist mucosal membrane. Dentition intact. No abscess  noted.  EAR, NOSE, THROAT: Clear without exudates. No external lesions.  NECK: Supple. No thyromegaly. No nodules. No JVD.  PULMONARY: Diffuse coarse rhonchi right sided +wheezes CARDIOVASCULAR: S1 and S2. Regular rate and rhythm. No murmurs, rubs, or gallops.  No edema. Pedal pulses 2+ bilaterally.  GASTROINTESTINAL: Soft, nontender, nondistended. No masses. Positive bowel sounds. No hepatosplenomegaly.  MUSCULOSKELETAL: No swelling, clubbing, or edema. Range of motion full in all extremities.  NEUROLOGIC: Cranial nerves II through XII are intact. No gross focal neurological deficits. Sensation intact. Reflexes intact.  SKIN: No ulceration, lesions, rashes, or cyanosis. Skin warm and dry. Turgor intact.  PSYCHIATRIC: Mood, affect within normal limits. The patient is awake, alert and oriented x 3. Insight, judgment intact.       IMAGING    Ct Head Wo Contrast  Result Date: 12/31/2018 CLINICAL DATA:  Seizure EXAM: CT HEAD WITHOUT CONTRAST TECHNIQUE: Contiguous axial images were obtained from the base of the skull through the vertex without intravenous contrast. COMPARISON:  None. FINDINGS: Brain: There is no acute intracranial hemorrhage, mass-effect, or edema. There is no new loss of gray-white differentiation. Confluent areas of hypoattenuation in the supratentorial white matter are nonspecific but likely reflect stable advanced microvascular ischemic changes. Chronic small infarcts of the left cerebellum and thalamus again noted. There is no extra-axial collection. Ventricles are stable in size. Vascular: No hyperdense vessel. Skull: Calvarium is unremarkable. Sinuses/Orbits: Mild paranasal sinus mucosal thickening. Orbits are unremarkable. Other: Mastoid air cells are clear. IMPRESSION: No acute or new findings since December 29, 2018. Stable chronic findings detailed above. Electronically Signed   By: Macy Mis M.D.   On: 12/31/2018 13:15   Ct Head Wo Contrast  Result Date:  12/29/2018 CLINICAL DATA:  Loss of consciousness EXAM: CT HEAD WITHOUT CONTRAST TECHNIQUE: Contiguous axial images were obtained from the base of the skull through the vertex without intravenous contrast. COMPARISON:  None. FINDINGS: Brain: No evidence of acute territorial infarction, hemorrhage, hydrocephalus,extra-axial collection or mass lesion/mass effect. The ventricles are normal in size and contour. Low-attenuation changes in the deep white matter consistent with small vessel ischemia. Vascular: No hyperdense vessel or unexpected calcification. Skull: The skull is intact. No fracture or focal lesion identified. Sinuses/Orbits: The visualized paranasal sinuses and mastoid air cells are clear. The orbits and globes intact. Other: None IMPRESSION: No acute intracranial abnormality. Findings consistent with chronic small vessel ischemia Electronically Signed   By: Prudencio Pair M.D.   On: 12/29/2018 23:08   Ct Angio Chest Pe W/cm &/or Wo Cm  Result Date: 01/20/2019 CLINICAL DATA:  Suspected covid, elevated LFTs, recent stroke EXAM: CT ANGIOGRAPHY CHEST, abdomen, and pelvis WITH CONTRAST TECHNIQUE: Multidetector CT imaging of the chest, abdomen, and pelvis was performed using the standard protocol during bolus administration of intravenous contrast. Multiplanar CT image reconstructions and MIPs were obtained to evaluate the vascular anatomy. CONTRAST:  35mL OMNIPAQUE IOHEXOL 350 MG/ML SOLN COMPARISON:  CT chest September 13, 2017 FINDINGS: Cardiovascular: There is a optimal opacification of the pulmonary arteries. There is a small nonocclusive thrombus seen within the left main pulmonary artery which extends into the anterior left upper lobe segmental branches and partially occlusive thrombus in the posterior left lower lobe subsegmental branches. There is also partially occlusive thrombus seen within the posterior right upper lobe segmental branch, series 6, image 37. There is also partially occlusive thrombus  seen extending into the right middle lobe subsegmental branches and posterior right lower lobe subsegmental branches. The heart is normal in size. No pericardial effusion or thickening. There is slight straightening of the interventricular septum which could be due to early right ventricular heart strain. There is normal three-vessel brachiocephalic anatomy without proximal stenosis. Scattered atherosclerosis is noted. The thoracic aorta is normal in  appearance. Mediastinum/Nodes: No hilar, mediastinal, or axillary adenopathy. Thyroid gland, trachea, and esophagus demonstrate no significant findings. Lungs/Pleura: Small subcentimeter pulmonary nodule are again identified as on prior exam dating back to 2019, for example in the posterior right upper lobe there is a 5 mm pulmonary nodule, series 8, image 21 and anterior right lower lobe, series 8, image 54 a 4 mm pulmonary nodule. There is however a new small nodular opacity in the posterior left upper lobe, series 8, image 47 measuring 7 mm not seen on prior exam. No large airspace consolidation or pleural effusion is seen. Centrilobular emphysematous changes seen within both lung apices. Musculoskeletal: No chest wall abnormality. No acute or significant osseous findings. Review of the MIP images confirms the above findings. Abdomen/pelvis: Hepatobiliary: There are multiple low-density liver lesions seen throughout which were not seen on the prior exam. There is also heterogeneous enhancement seen throughout the liver parenchyma. Central periportal edema is noted. The portal vein is patent. The patient is status post cholecystectomy. A small amount of perihepatic ascites is noted. Pancreas: Unremarkable. No pancreatic ductal dilatation or surrounding inflammatory changes. Spleen: Normal in size without focal abnormality. Adrenals/Urinary Tract: Both adrenal glands appear normal. The kidneys and collecting system appear normal without evidence of urinary tract  calculus or hydronephrosis. Bladder is unremarkable. Stomach/Bowel: The stomach, small bowel, and colon are normal in appearance. No inflammatory changes, wall thickening, or obstructive findings. Vascular/Lymphatic: There are no enlarged mesenteric, retroperitoneal, or pelvic lymph nodes. Scattered aortic atherosclerotic calcifications are seen without aneurysmal dilatation. Reproductive: The uterus and adnexa are unremarkable. Other: No evidence of abdominal wall mass or hernia. Musculoskeletal: No acute or significant osseous findings. IMPRESSION: 1. Bilateral partially occlusive segmental and subsegmental pulmonary emboli as described above. 2. Findings which could be suggestive of early right ventricular heart strain. 3. Multiple unchanged subcentimeter pulmonary nodules as on prior exam dating back to 2019. 4. New nodular opacity within the posterior left upper lobe measuring 7 mm which is nonspecific, and would recommend attention on short interval follow-up exam. 5. Centrilobular emphysematous changes. 6. Numerous hepatic lesions throughout, likely suggestive of hepatic metastatic disease. 7. Small amount of perihepatic ascites 8. These results were called by telephone at the time of interpretation on 01/20/2019 at 2:07 am to provider [facility] , who verbally acknowledged these results. Electronically Signed   By: Prudencio Pair M.D.   On: 01/20/2019 02:09   Mr Jeri Cos [id number] Contrast  Result Date: 01/01/2019 CLINICAL DATA:  Encephalopathy.  DKA. EXAM: MRI HEAD WITHOUT AND WITH CONTRAST TECHNIQUE: Multiplanar, multiecho pulse sequences of the brain and surrounding structures were obtained without and with intravenous contrast. CONTRAST:  44mL GADAVIST GADOBUTROL 1 MMOL/ML IV SOLN COMPARISON:  CT head 12/31/2018 FINDINGS: Brain: Small acute infarct left inferior cerebellum. No other acute infarct Chronic infarct left inferior cerebellum. Extensive white matter disease bilaterally. Diffuse subcortical and  deep white matter hyperintensity throughout both cerebral hemispheres. Hyperintensity is also noted in the right thalamus. Negative for hemorrhage or mass. Ventricle size normal. Normal enhancement postcontrast administration Vascular: Normal arterial flow voids Skull and upper cervical spine: No focal bony abnormality. Cervical spine fusion hardware. Sinuses/Orbits: Negative Other: None IMPRESSION: 1. Acute small infarct left medial cerebellum. 2. Extensive disease throughout the white matter most compatible with chronic ischemia. Chronic infarct left cerebellum. Electronically Signed   By: Franchot Gallo M.D.   On: 01/01/2019 15:05   Ct Abd/pelvis W/ Iv Contrast  Result Date: 01/20/2019 CLINICAL DATA:  Suspected covid, elevated LFTs,  recent stroke EXAM: CT ANGIOGRAPHY CHEST, abdomen, and pelvis WITH CONTRAST TECHNIQUE: Multidetector CT imaging of the chest, abdomen, and pelvis was performed using the standard protocol during bolus administration of intravenous contrast. Multiplanar CT image reconstructions and MIPs were obtained to evaluate the vascular anatomy. CONTRAST:  76mL OMNIPAQUE IOHEXOL 350 MG/ML SOLN COMPARISON:  CT chest September 13, 2017 FINDINGS: Cardiovascular: There is a optimal opacification of the pulmonary arteries. There is a small nonocclusive thrombus seen within the left main pulmonary artery which extends into the anterior left upper lobe segmental branches and partially occlusive thrombus in the posterior left lower lobe subsegmental branches. There is also partially occlusive thrombus seen within the posterior right upper lobe segmental branch, series 6, image 37. There is also partially occlusive thrombus seen extending into the right middle lobe subsegmental branches and posterior right lower lobe subsegmental branches. The heart is normal in size. No pericardial effusion or thickening. There is slight straightening of the interventricular septum which could be due to early right  ventricular heart strain. There is normal three-vessel brachiocephalic anatomy without proximal stenosis. Scattered atherosclerosis is noted. The thoracic aorta is normal in appearance. Mediastinum/Nodes: No hilar, mediastinal, or axillary adenopathy. Thyroid gland, trachea, and esophagus demonstrate no significant findings. Lungs/Pleura: Small subcentimeter pulmonary nodule are again identified as on prior exam dating back to 2019, for example in the posterior right upper lobe there is a 5 mm pulmonary nodule, series 8, image 21 and anterior right lower lobe, series 8, image 54 a 4 mm pulmonary nodule. There is however a new small nodular opacity in the posterior left upper lobe, series 8, image 47 measuring 7 mm not seen on prior exam. No large airspace consolidation or pleural effusion is seen. Centrilobular emphysematous changes seen within both lung apices. Musculoskeletal: No chest wall abnormality. No acute or significant osseous findings. Review of the MIP images confirms the above findings. Abdomen/pelvis: Hepatobiliary: There are multiple low-density liver lesions seen throughout which were not seen on the prior exam. There is also heterogeneous enhancement seen throughout the liver parenchyma. Central periportal edema is noted. The portal vein is patent. The patient is status post cholecystectomy. A small amount of perihepatic ascites is noted. Pancreas: Unremarkable. No pancreatic ductal dilatation or surrounding inflammatory changes. Spleen: Normal in size without focal abnormality. Adrenals/Urinary Tract: Both adrenal glands appear normal. The kidneys and collecting system appear normal without evidence of urinary tract calculus or hydronephrosis. Bladder is unremarkable. Stomach/Bowel: The stomach, small bowel, and colon are normal in appearance. No inflammatory changes, wall thickening, or obstructive findings. Vascular/Lymphatic: There are no enlarged mesenteric, retroperitoneal, or pelvic lymph  nodes. Scattered aortic atherosclerotic calcifications are seen without aneurysmal dilatation. Reproductive: The uterus and adnexa are unremarkable. Other: No evidence of abdominal wall mass or hernia. Musculoskeletal: No acute or significant osseous findings. IMPRESSION: 1. Bilateral partially occlusive segmental and subsegmental pulmonary emboli as described above. 2. Findings which could be suggestive of early right ventricular heart strain. 3. Multiple unchanged subcentimeter pulmonary nodules as on prior exam dating back to 2019. 4. New nodular opacity within the posterior left upper lobe measuring 7 mm which is nonspecific, and would recommend attention on short interval follow-up exam. 5. Centrilobular emphysematous changes. 6. Numerous hepatic lesions throughout, likely suggestive of hepatic metastatic disease. 7. Small amount of perihepatic ascites 8. These results were called by telephone at the time of interpretation on 01/20/2019 at 2:07 am to provider [facility] , who verbally acknowledged these results. Electronically Signed   By:  Prudencio Pair M.D.   On: 01/20/2019 02:09   Dg Chest Portable 1 View  Result Date: 01/19/2019 CLINICAL DATA:  Shortness of breath EXAM: PORTABLE CHEST 1 VIEW COMPARISON:  12/31/2018 FINDINGS: Cardiomegaly. Lungs clear. No effusions or edema. No acute bony abnormality. IMPRESSION: Cardiomegaly.  No active disease. Electronically Signed   By: Rolm Baptise M.D.   On: 01/19/2019 23:33   Dg Chest Port 1 View  Result Date: 12/31/2018 CLINICAL DATA:  Sepsis EXAM: PORTABLE CHEST 1 VIEW COMPARISON:  December 29, 2018 which is FINDINGS: The heart size and mediastinal contours are unchanged. Again noted is prominence to the central pulmonary vasculature. There is mildly increased interstitial markings seen at both lower lungs slightly more pronounced than the prior exam. No pleural effusion. No acute osseous abnormality. IMPRESSION: Pulmonary vascular congestion and mildly  increased interstitial markings at both lower lungs, slightly more pronounced than the prior exam. This could be due to interstitial edema and/or early infectious etiology. Electronically Signed   By: Prudencio Pair M.D.   On: 12/31/2018 03:38   Dg Chest Port 1 View  Result Date: 12/29/2018 CLINICAL DATA:  Altered mental status EXAM: PORTABLE CHEST 1 VIEW COMPARISON:  November 12, 2016 FINDINGS: The heart size and mediastinal contours are within normal limits. There is prominence to the central pulmonary vasculature. No large airspace consolidation or pleural effusion. IMPRESSION: Findings suggestive pulmonary vascular congestion. Electronically Signed   By: Prudencio Pair M.D.   On: 12/29/2018 21:16     IMPRESSION: 1. Bilateral partially occlusive segmental and subsegmental pulmonary emboli as described above. 2. Findings which could be suggestive of early right ventricular heart strain. 3. Multiple unchanged subcentimeter pulmonary nodules as on prior exam dating back to 2019. 4. New nodular opacity within the posterior left upper lobe measuring 7 mm which is nonspecific, and would recommend attention on short interval follow-up exam. 5. Centrilobular emphysematous changes. 6. Numerous hepatic lesions throughout, likely suggestive of hepatic metastatic disease. 7. Small amount of perihepatic ascites 8. These results were called by telephone at the time of interpretation on 01/20/2019 at 2:07 am to provider [facility] , who verbally acknowledged these results.   Electronically Signed   By: Prudencio Pair M.D.   On: 01/20/2019 02:09    ASSESSMENT/PLAN   Acute hypoxemic respiratory failure secondary to non-massive bilateral partially occlusive pulmonary embolism  -Patient with stable blood pressure on 4 L nasal cannula saturating 90 to 95% -High-sensitivity troponin is mildly elevated at 2019 -Slight straightening of the interventricular septum of the heart indicative of early RV  strain per CT chest  --will obtain BNP -CT abdomen with suggestion of liver metastasis -Please hold any oral anticoagulants and lovenox until procedures are done -Ideally would involve oncologist to guide a diagnostic approach for potential malignancy including liver biopsy versus PET/CT, MRI brain was normal on 01/01/2019 -Discussed case with Dr Seward Meth - please keep patient NPO for possible IR performed liver biopsy.  -BNP mildly elevated over 870 on admission -Transthoracic echo with reduced RV systolic function as well as dilated right ventricular size indicative of RV strain -Tricuspid regurgitant velocity 3.62 m/s indicative of pulmonary hypertension -Due to findings of RV strain, would be prudent to have vascular surgery evaluation for possible thrombectomy-Discussed case with Dr. Delana Meyer.   Chronic COPD  -Continue DuoNeb every 6 hours while awake  -Chest physiotherapy with MetaNeb to combat atelectatic segments -Please encourage patient to use incentive spirometer at the bedside at least every hour few times No  clinical signs or symptoms of acute exacerbation of COPD    Thank you Dr. Sarajane Jews for allowing me to participate in the care of this patient.   Patient/Family are satisfied with care plan and all questions have been answered.  This document was prepared using Dragon voice recognition software and may include unintentional dictation errors.     Ottie Glazier, M.D.  Division of [place]

## 2019-01-20 NOTE — ED Notes (Signed)
Patient transported to CT 

## 2019-01-20 NOTE — Progress Notes (Signed)
ANTICOAGULATION CONSULT NOTE - Initial Consult  Pharmacy Consult for Heparin Indication: pulmonary embolus  No Known Allergies  Patient Measurements: Height: 5\' 6"  (167.6 cm) Weight: 122 lb (55.3 kg) IBW/kg (Calculated) : 59.3 HEPARIN DW (KG): 55.3  Vital Signs: Temp: 97.9 F (36.6 C) (11/29 2306) Temp Source: Oral (11/29 2306) BP: 130/81 (11/30 0000) Pulse Rate: 93 (11/30 0000)  Labs: Recent Labs    01/19/19 2325  HGB 9.9*  HCT 31.0*  PLT 355  LABPROT 15.8*  INR 1.3*  CREATININE 1.58*   Estimated Creatinine Clearance: 29.3 mL/min (A) (by C-G formula based on SCr of 1.58 mg/dL (H)).  Medical History: Past Medical History:  Diagnosis Date  . Allergic rhinitis   . Aneurysm of unspecified site ([place])   . Cholelithiasis   . Diabetes type 2, controlled ([place])   . GERD (gastroesophageal reflux disease)   . Hypertension   . Neuropathy   . Veno-occlusive disease    Medications:  (Not in a hospital admission)  Assessment: Pharmacy asked to initiate and monitor Heparin for PE.  Baseline labs ordered.  Per PTA med list pt not on any anticoagulants.  Goal of Therapy:  Heparin level 0.3-0.7 units/ml Monitor platelets by anticoagulation protocol: Yes   Plan:  Heparin bolus 4000 units x 1 then infusion at 950 units/hr Check HL in 6 hours  Hart Robinsons A 01/20/2019,2:30 AM

## 2019-01-20 NOTE — Progress Notes (Signed)
ANTICOAGULATION CONSULT NOTE - Initial Consult  Pharmacy Consult for Heparin Indication: pulmonary embolus  No Known Allergies  Patient Measurements: Height: 5\' 6"  (167.6 cm) Weight: 122 lb (55.3 kg) IBW/kg (Calculated) : 59.3 HEPARIN DW (KG): 55.3  Vital Signs: BP: 129/73 (11/30 1523) Pulse Rate: 101 (11/30 1630)  Labs: Recent Labs    01/19/19 2325 01/20/19 0709 01/20/19 0905 01/20/19 1120 01/20/19 1616  HGB 9.9*  --   --   --   --   HCT 31.0*  --   --   --   --   PLT 355  --   --   --   --   APTT  --   --  119*  --   --   LABPROT 15.8*  --   --   --   --   INR 1.3*  --   --   --   --   HEPARINUNFRC  --   --  0.60  --  0.48  CREATININE 1.58*  --   --  1.33*  --   TROPONINIHS  --  2,101*  --  2,019*  --    Estimated Creatinine Clearance: 34.9 mL/min (A) (by C-G formula based on SCr of 1.33 mg/dL (H)).  Medical History: Past Medical History:  Diagnosis Date  . Allergic rhinitis   . Aneurysm of unspecified site ([place])   . Cholelithiasis   . Diabetes type 2, controlled ([place])   . GERD (gastroesophageal reflux disease)   . Hypertension   . Neuropathy   . Veno-occlusive disease    Medications:  (Not in a hospital admission)  Assessment: Pharmacy asked to initiate and monitor Heparin for PE.  Baseline labs ordered.  Per PTA med list pt not on any anticoagulants.  11/30 0230 Heparin bolus 4000 units x 1 then infusion at 950 units/hr 11/30 0905 Heparin Level 0.60 therapeutic  11/30 1616 Heparin Level 0.48 therapeutic  Goal of Therapy:  Heparin level 0.3-0.7 units/ml Monitor platelets by anticoagulation protocol: Yes   Plan:  Heparin level is therapeutic. Will continue heparin drip @ 950 units/hr. Order heparin and CBC with AM labs. Pharmacy will continue to monitor and follow.   Oswald Hillock, PharmD, BCPS Clinical Pharmacist 01/20/2019 5:01 PM

## 2019-01-20 NOTE — Progress Notes (Signed)
ANTICOAGULATION CONSULT NOTE - Initial Consult  Pharmacy Consult for Heparin Indication: pulmonary embolus  No Known Allergies  Patient Measurements: Height: 5\' 6"  (167.6 cm) Weight: 122 lb (55.3 kg) IBW/kg (Calculated) : 59.3 HEPARIN DW (KG): 55.3  Vital Signs: Temp: 97.9 F (36.6 C) (11/29 2306) Temp Source: Oral (11/29 2306) BP: 111/73 (11/30 0900) Pulse Rate: 92 (11/30 0900)  Labs: Recent Labs    01/19/19 2325 01/20/19 0709 01/20/19 0905  HGB 9.9*  --   --   HCT 31.0*  --   --   PLT 355  --   --   APTT  --   --  119*  LABPROT 15.8*  --   --   INR 1.3*  --   --   HEPARINUNFRC  --   --  0.60  CREATININE 1.58*  --   --   TROPONINIHS  --  2,101*  --    Estimated Creatinine Clearance: 29.3 mL/min (A) (by C-G formula based on SCr of 1.58 mg/dL (H)).  Medical History: Past Medical History:  Diagnosis Date  . Allergic rhinitis   . Aneurysm of unspecified site ([place])   . Cholelithiasis   . Diabetes type 2, controlled ([place])   . GERD (gastroesophageal reflux disease)   . Hypertension   . Neuropathy   . Veno-occlusive disease    Medications:  (Not in a hospital admission)  Assessment: Pharmacy asked to initiate and monitor Heparin for PE.  Baseline labs ordered.  Per PTA med list pt not on any anticoagulants.  11/30 0230 Heparin bolus 4000 units x 1 then infusion at 950 units/hr 11/30 0905 Heparin Level 0.60 therapeutic x 1  Goal of Therapy:  Heparin level 0.3-0.7 units/ml Monitor platelets by anticoagulation protocol: Yes   Plan:  Will continue heparin drip @ 950 units/hr Will check confirmatory HL in 6 hours per protocol Daily CBC's  Lu Duffel, PharmD, BCPS Clinical Pharmacist 01/20/2019 10:34 AM

## 2019-01-20 NOTE — Progress Notes (Signed)
PROGRESS NOTE  Audrey Chan [id number] DOB: 05-Jul-1950 DOA: 01/19/2019 PCP: Theotis Burrow, MD  Brief History   68 year old woman PMH diabetes mellitus type 2 with neuropathy, CVA, COPD, recent complex hospitalization in which she was treated for DKA, sepsis and cerebellar stroke without apparent sequela; PRESENTED 11/29 with shortness of breath 4-5 days without any fever chills or cough or chest pain.  Hypoxic on room air, CT chest showed multiple PE in addition to what appears to be metastatic lesions in the liver of unknown primary.  Admitted for treatment of PE and further evaluation of possible metastatic disease.  A & P  Bilateral pulmonary embolism with suspected right heart strain by CT, supported by markedly elevated troponin, with associated acute hypoxic respiratory failure, in the context of suspected metastatic disease, new diagnosis.  BNP 876 on admission. --Continue anticoagulation as per critical care recommendation at time of admission after reviewing/benefit given recent stroke.  Pulmonology consulted.  Definitive management per pulmonology. --Oxygenation acceptable on 4 L.  Troponins flat, follow-up echocardiogram --Check bilateral lower extremity venous Dopplers  Nodular opacity posterior left upper lobe 7 mm, multiple unchanged subcentimeter pulmonary nodules stable since 2019, liver lesions seen on CT concerning for metastatic disease, with associated marked elevation of alkaline phosphatase, modest elevation of AST, ALT.  Total bilirubin within normal limits. --Will discuss timing of possible biopsy with interventional radiology and pulmonology given need for anticoagulation.  Diabetes mellitus type 2 with peripheral neuropathy, CKD on gabapentin, meal coverage insulin, Lantus 25 units, Metformin as an outpatient --Beta hydroxybutyrate modestly elevated but anion gap is normalized and blood sugars are stable.  She appears clinically well and I think the mild  elevation of the beta hydroxybutyrate is insignificant.   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  Will ask neurology for recommendations.  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  Chart review . 11/17 ED visit presented with hyperglycemia.  No evidence of DKA.  Treated with fluids and insulin and discharged home. . 11/29 ED visit.  Presented with shortness of breath and dizziness.  Resolved Hospital Problem list       DVT prophylaxis: heparin infusion Code Status: Full Family Communication: none Disposition Plan: home    Murray Hodgkins, MD  [facility] Direct contact: see [email or web address] (further directions at bottom of note if needed) 7PM-7AM contact night coverage as at bottom of note 01/20/2019, 3:03 PM  LOS: 0 days   Significant Hospital Events   . 11/29 admitted for bilateral PE with right heart strain, new liver lesions concerning for metastatic disease.  Started on heparin. Marland Kitchen 11/30 pulmonology consultation   Consults:  . Pulmonology   Procedures:  .   Significant Diagnostic Tests:  . 11/29 chest x-ray no acute disease . 11/29 CT angiogram of the chest, CT abdomen and pelvis: Bilateral partially occlusive segmental and subsegmental pulmonary emboli, findings suggestive of early right ventricular heart strain, nodular opacity left upper lobe, multiple unchanged subcentimeter pulmonary nodules, centrilobular emphysematous changes, numerous hepatic lesions throughout suggestive of hepatic metastatic disease . EKG sinus rhythm, prolonged QT, septal MI old compared to previous study 05/02/2018, anterolateral T  wave inversion new   Micro Data:  . SARS-CoV-2  antigen test  negative . SARS-CoV-2 PCR pending . 11/29 urine culture . 11/29 blood cultures   Antimicrobials:  .   Interval History/Subjective  Feels okay today, breathing fine, no chest pain.  No leg pain.  Reports after discharge she has not traveled anywhere, did not have any get-togethers for Thanksgiving.  Has not been immobilized.  No previous history of blood clots.  Objective   Vitals:  Vitals:   01/20/19 1324 01/20/19 1409  BP: 113/76 (!) 151/103  Pulse: 90 (!) 101  Resp: 15 15  Temp:    SpO2: 100% 95%    Exam:  Constitutional.  Appears calm, comfortable. Respiratory.  Clear to auscultation bilaterally.  No wheezes, rales or rhonchi.  Normal respiratory effort. Cardiovascular.  Regular rate and rhythm.  No murmur, rub or gallop.  No lower extremity edema. Abdomen.  Soft, nontender, nondistended.  No hepatic masses appreciated. Psychiatric.  Grossly normal mood and affect.  Speech fluent and appropriate.   I have personally reviewed the following:   Today's Data  . CBG stable . Troponins flat 2101, 2019 . Creatinine improving, 1.33; BUN trending down, 24 . LFTs trending down including alkaline phosphatase, AST, ALT.  Total bilirubin within normal limits. . Beta hydroxybutyric acid is mildly elevated but not felt to be of any clinical significance . TSH within normal limits   Scheduled Meds: . amLODipine  5 mg Oral Daily  . cholecalciferol  1,000 Units Oral Daily  . ferrous sulfate  325 mg Oral BID WC  . gabapentin  800 mg Oral TID  . insulin aspart  0-9 Units Subcutaneous TID WC  . insulin glargine  25 Units Subcutaneous QHS  . lisinopril  20 mg Oral Daily  . loratadine  10 mg Oral Daily  . metoprolol tartrate  12.5 mg Oral BID  . multivitamin with minerals  1 tablet Oral Daily  . [START ON 01/21/2019] rosuvastatin  20 mg Oral Daily  . [START ON 01/21/2019] umeclidinium-vilanterol  1 puff Inhalation Daily    Continuous Infusions: . heparin 950 Units/hr (01/20/19 0340)    Principal Problem:   Multiple subsegmental pulmonary emboli without acute cor pulmonale (HCC) Active Problems:   Diabetic peripheral neuropathy associated with type 2 diabetes mellitus (HCC)   Weight loss, unintentional   Lung nodules   Liver metastases (HCC)   CKD (chronic kidney disease), stage III   Prolonged QT interval   LOS: 0 days   How to contact the [facility] Attending or Consulting provider [place] or covering provider during after hours [place], for this patient?  1. Check the care team in [facility] and look for a) attending/consulting TR[facility] provider listed and b) the [facility] team listed 2. Log into [email or web address] and use [place]'s universal password to access. If you do not have the password, please contact the hospital operator. 3. Locate the [facility] provider you are looking for under [facility] and page to a number that you can be directly reached. 4. If you still have difficulty reaching the provider, please page the [facility] (Director on Call) for the Hospitalists listed on amion for assistance.

## 2019-01-20 NOTE — ED Notes (Signed)
Messaged Dr Sarajane Jews of critical lab troponin of 2101, also informed ED MD.

## 2019-01-20 NOTE — ED Notes (Signed)
This RN spoke with pt's sister on the phone. Sister made aware that pt is being r/o for COVID and pt currently cannot have visitors. Pt's sister reassured that she would be updated when POC is updated.    Sister: [place] [phone]

## 2019-01-20 NOTE — Progress Notes (Signed)
*  PRELIMINARY RESULTS* Echocardiogram 2D Echocardiogram has been performed.  Audrey Chan 01/20/2019, 10:07 AM

## 2019-01-20 NOTE — H&P (Addendum)
.  History and Physical    Audrey Chan [id number] DOB: 05/18/1950 DOA: 01/19/2019  PCP: Theotis Burrow, MD  Patient coming from: HOME  I have personally briefly reviewed patient's old medical records in [place]  Chief Complaint:  Sob x 4-5 days   HPI: Audrey Chan is a 68 y.o. female with medical history significant of  Type 2 diabetes with neuropathy CVA hypertension COPD and hyperlipidemia.  Of note patient has interim history of admission to the hospital on 11/09  At which time she was diagnosed with change in mental status due to diabetic ketoacidosis/sepsis/patient also was found to have a cerebellar stroke at that time as well.  Of note patient had no sequela from her cerebral stroke. Patient now presents to the ED with complaints of shortness of breath x4 to 5 days.  Denies any associated fever chills or cough with the symptoms.  Also denies any associated chest pain.  Patient states that her symptoms were made worse with exertion.  Patient also denies any sick contacts.  Patient decided to come into the ED due to progression of her symptoms. ED Course:  Initial vitals: Patient is afebrile, blood pressure 150/85, heart rate 95 sats 84% on room air repeat on 2 L low 90s. 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 Patient is admitted for further treatment of her pulmonary emboli and evaluation her new newly diagnosed malignancy  Review of Systems: As per HPI otherwise 10 point review of systems negative.   Past Medical History:  Diagnosis Date   Allergic rhinitis    Aneurysm of unspecified site ([place])    Cholelithiasis    Diabetes type 2, controlled ([place])    GERD (gastroesophageal reflux disease)    Hypertension    Neuropathy    Veno-occlusive disease     Past Surgical History:  Procedure Laterality Date     ABDOMINAL HYSTERECTOMY  1972   CERVICAL FUSION     CHOLECYSTECTOMY N/A 12/28/2014   Procedure: LAPAROSCOPIC CHOLECYSTECTOMY;  Surgeon: Sherri Rad, MD;  Location: [facility] ORS;  Service: General;  Laterality: N/A;   EYE SURGERY     detached retina   MASS EXCISION Left 05/06/2018   Procedure: EXCISION OF LEFT ANTECUBITAL CYST, DIABETIC;  Surgeon: Vickie Epley, MD;  Location: [facility] ORS;  Service: Vascular;  Laterality: Left;     reports that she quit smoking about 19 years ago. She has never used smokeless tobacco. She reports that she does not drink alcohol or use drugs.  No Known Allergies  Family History  Problem Relation Age of Onset   Lung cancer Father    Prior to Admission medications   Medication Sig Start Date End Date Taking? Authorizing Provider  acetaminophen (TYLENOL) 500 MG tablet Take 500-1,000 mg by mouth every 6 (six) hours as needed for moderate pain.     [provider]  albuterol (PROVENTIL HFA;VENTOLIN HFA) 108 (90 BASE) MCG/ACT inhaler Inhale 2 puffs into the lungs every 6 (six) hours as needed for wheezing or shortness of breath.     [provider]  amLODipine (NORVASC) 5 MG tablet Take 1 tablet (5 mg total) by mouth daily. 01/04/19   Dhungel, Flonnie Overman, MD  aspirin EC 81 MG tablet Take 81 mg by mouth daily.    [provider]  cetirizine (ZYRTEC) 10 MG tablet Take 10  mg by mouth daily.     [provider]  Cholecalciferol (VITAMIN D3) 1000 units CAPS Take 1,000 Units by mouth daily.     [provider]  clopidogrel (PLAVIX) 75 MG tablet Take 75 mg by mouth daily.    [provider]  ferrous sulfate 325 (65 FE) MG tablet Take 325 mg by mouth 2 (two) times daily with a meal.     [provider]  fluticasone (FLONASE) 50 MCG/ACT nasal spray Place 2 sprays into both nostrils daily as needed for allergies or rhinitis.     [provider]  gabapentin (NEURONTIN) 800 MG tablet Take 800 mg by mouth  3 (three) times daily.  12/26/16   [provider]  hydrochlorothiazide (HYDRODIURIL) 25 MG tablet Take 25 mg by mouth daily.    [provider]  insulin aspart (NOVOLOG) 100 UNIT/ML injection Check blood sugar prior to meals and give insulin on the following scale: Blood sugar less than 150: 0 units BS 150-200: 2 units BS 200-250: 3 units BS 250-300: 4 units BS 300-350: 5 units BS 350-400: 6 units BS above 400: 8 units and call your doctor. 01/07/19   Carrie Mew, MD  LANTUS SOLOSTAR 100 UNIT/ML Solostar Pen Inject 25 Units into the skin at bedtime. 01/07/19   Carrie Mew, MD  lisinopril (ZESTRIL) 10 MG tablet Take 2 tablets (20 mg total) by mouth daily. 01/03/19   Dhungel, Flonnie Overman, MD  metFORMIN (GLUCOPHAGE-XR) 500 MG 24 hr tablet Take 1,000 mg by mouth daily.     [provider]  mometasone (ELOCON) 0.1 % cream Apply 1 application topically 2 (two) times daily as needed (rash).  10/08/17   [provider]  Multiple Vitamin (MULTI-VITAMINS) TABS Take 1 tablet by mouth daily.    [provider]  polyethylene glycol powder (GLYCOLAX/MIRALAX) powder Take 0.5 Containers by mouth daily as needed for moderate constipation.  10/08/15   [provider]  rosuvastatin (CRESTOR) 20 MG tablet Take 20 mg by mouth daily.  05/02/13   [provider]  umeclidinium-vilanterol (ANORO ELLIPTA) 62.5-25 MCG/INH AEPB Inhale 1 puff into the lungs daily.    [provider]    Physical Exam: Vitals:   01/19/19 2305 01/19/19 2306 01/19/19 2330 01/20/19 0000  BP: (!) 150/85  137/84 130/81  Pulse: 95  93 93  Resp: 20  20 19   Temp:  97.9 F (36.6 C)    TempSrc:  Oral    SpO2: (!) 84% 96% 96% 97%  Weight:      Height:        Constitutional: NAD, calm, comfortable Vitals:   01/19/19 2305 01/19/19 2306 01/19/19 2330 01/20/19 0000  BP: (!) 150/85  137/84 130/81  Pulse: 95  93 93  Resp: 20  20 19   Temp:  97.9 F (36.6 C)     TempSrc:  Oral    SpO2: (!) 84% 96% 96% 97%  Weight:      Height:       Eyes: PERRL, lids and conjunctivae normal ENMT: Mucous membranes are moist. Posterior pharynx clear of any exudate or lesions.Normal dentition.  Neck: normal, supple, no masses, no thyromegaly Respiratory: clear to auscultation bilaterally, no wheezing, no crackles. Normal respiratory effort. No accessory muscle use.  Cardiovascular: Regular rate and rhythm, no murmurs / rubs / gallops. No extremity edema. 2+ pedal pulses. No carotid bruits.  Abdomen: RUQ tenderness, no masses palpated. No hepatosplenomegaly. Bowel sounds positive.  Musculoskeletal: no clubbing / cyanosis. No  joint deformity upper and lower extremities. Good ROM, no contractures. Normal muscle tone.  Skin: no rashes, lesions, ulcers. No induration Neurologic: CN 2-12 grossly intact. Sensation intact, DTR normal. Strength 5/5 in all 4.  Psychiatric: Normal judgment and insight. Alert and oriented x 3. Normal mood.    Labs on Admission: I have personally reviewed following labs and imaging studies  CBC: Recent Labs  Lab 01/19/19 2325  WBC 11.0*  NEUTROABS 7.6  HGB 9.9*  HCT 31.0*  MCV 72.6*  PLT [id number]   Basic Metabolic Panel: Recent Labs  Lab 01/19/19 2325  NA 130*  K 4.7  CL 97*  CO2 17*  GLUCOSE 181*  BUN 28*  CREATININE 1.58*  CALCIUM 9.2  MG 2.0   GFR: Estimated Creatinine Clearance: 29.3 mL/min (A) (by C-G formula based on SCr of 1.58 mg/dL (H)). Liver Function Tests: Recent Labs  Lab 01/19/19 2325  AST 145*  ALT 118*  ALKPHOS 1,044*  BILITOT 0.6  PROT 8.6*  ALBUMIN 3.7   Recent Labs  Lab 01/19/19 2325  LIPASE 15   No results for input(s): AMMONIA in the last 168 hours. Coagulation Profile: Recent Labs  Lab 01/19/19 2325  INR 1.3*   Cardiac Enzymes: No results for input(s): CKTOTAL, CKMB, CKMBINDEX, TROPONINI in the last 168 hours. BNP (last 3 results) No results for input(s): PROBNP in the last 8760  hours. HbA1C: No results for input(s): HGBA1C in the last 72 hours. CBG: No results for input(s): GLUCAP in the last 168 hours. Lipid Profile: Recent Labs    01/19/19 2325  TRIG 136   Thyroid Function Tests: No results for input(s): TSH, T4TOTAL, FREET4, T3FREE, THYROIDAB in the last 72 hours. Anemia Panel: Recent Labs    01/19/19 2325  FERRITIN 1,210*   Urine analysis:    Component Value Date/Time   COLORURINE YELLOW (A) 01/19/2019 2325   APPEARANCEUR HAZY (A) 01/19/2019 2325   APPEARANCEUR CLEAR 06/07/2014 1233   LABSPEC 1.013 01/19/2019 2325   LABSPEC 1.010 06/07/2014 1233   PHURINE 5.0 01/19/2019 2325   GLUCOSEU NEGATIVE 01/19/2019 2325   GLUCOSEU 500 mg/dL 06/07/2014 1233   HGBUR NEGATIVE 01/19/2019 2325   BILIRUBINUR NEGATIVE 01/19/2019 2325   BILIRUBINUR NEGATIVE 06/07/2014 [place] 01/19/2019 2325   PROTEINUR 30 (A) 01/19/2019 2325   NITRITE NEGATIVE 01/19/2019 2325   LEUKOCYTESUR NEGATIVE 01/19/2019 2325   LEUKOCYTESUR NEGATIVE 06/07/2014 1233    Radiological Exams on Admission: Ct Angio Chest Pe W/cm &/or Wo Cm  Result Date: 01/20/2019 CLINICAL DATA:  Suspected covid, elevated LFTs, recent stroke EXAM: CT ANGIOGRAPHY CHEST, abdomen, and pelvis WITH CONTRAST TECHNIQUE: Multidetector CT imaging of the chest, abdomen, and pelvis was performed using the standard protocol during bolus administration of intravenous contrast. Multiplanar CT image reconstructions and MIPs were obtained to evaluate the vascular anatomy. CONTRAST:  4mL OMNIPAQUE IOHEXOL 350 MG/ML SOLN COMPARISON:  CT chest September 13, 2017 FINDINGS: Cardiovascular: There is a optimal opacification of the pulmonary arteries. There is a small nonocclusive thrombus seen within the left main pulmonary artery which extends into the anterior left upper lobe segmental branches and partially occlusive thrombus in the posterior left lower lobe subsegmental branches. There is also partially occlusive  thrombus seen within the posterior right upper lobe segmental branch, series 6, image 37. There is also partially occlusive thrombus seen extending into the right middle lobe subsegmental branches and posterior right lower lobe subsegmental branches. The heart is normal in size. No pericardial effusion or thickening.  There is slight straightening of the interventricular septum which could be due to early right ventricular heart strain. There is normal three-vessel brachiocephalic anatomy without proximal stenosis. Scattered atherosclerosis is noted. The thoracic aorta is normal in appearance. Mediastinum/Nodes: No hilar, mediastinal, or axillary adenopathy. Thyroid gland, trachea, and esophagus demonstrate no significant findings. Lungs/Pleura: Small subcentimeter pulmonary nodule are again identified as on prior exam dating back to 2019, for example in the posterior right upper lobe there is a 5 mm pulmonary nodule, series 8, image 21 and anterior right lower lobe, series 8, image 54 a 4 mm pulmonary nodule. There is however a new small nodular opacity in the posterior left upper lobe, series 8, image 47 measuring 7 mm not seen on prior exam. No large airspace consolidation or pleural effusion is seen. Centrilobular emphysematous changes seen within both lung apices. Musculoskeletal: No chest wall abnormality. No acute or significant osseous findings. Review of the MIP images confirms the above findings. Abdomen/pelvis: Hepatobiliary: There are multiple low-density liver lesions seen throughout which were not seen on the prior exam. There is also heterogeneous enhancement seen throughout the liver parenchyma. Central periportal edema is noted. The portal vein is patent. The patient is status post cholecystectomy. A small amount of perihepatic ascites is noted. Pancreas: Unremarkable. No pancreatic ductal dilatation or surrounding inflammatory changes. Spleen: Normal in size without focal abnormality.  Adrenals/Urinary Tract: Both adrenal glands appear normal. The kidneys and collecting system appear normal without evidence of urinary tract calculus or hydronephrosis. Bladder is unremarkable. Stomach/Bowel: The stomach, small bowel, and colon are normal in appearance. No inflammatory changes, wall thickening, or obstructive findings. Vascular/Lymphatic: There are no enlarged mesenteric, retroperitoneal, or pelvic lymph nodes. Scattered aortic atherosclerotic calcifications are seen without aneurysmal dilatation. Reproductive: The uterus and adnexa are unremarkable. Other: No evidence of abdominal wall mass or hernia. Musculoskeletal: No acute or significant osseous findings. IMPRESSION: 1. Bilateral partially occlusive segmental and subsegmental pulmonary emboli as described above. 2. Findings which could be suggestive of early right ventricular heart strain. 3. Multiple unchanged subcentimeter pulmonary nodules as on prior exam dating back to 2019. 4. New nodular opacity within the posterior left upper lobe measuring 7 mm which is nonspecific, and would recommend attention on short interval follow-up exam. 5. Centrilobular emphysematous changes. 6. Numerous hepatic lesions throughout, likely suggestive of hepatic metastatic disease. 7. Small amount of perihepatic ascites 8. These results were called by telephone at the time of interpretation on 01/20/2019 at 2:07 am to provider [facility] , who verbally acknowledged these results. Electronically Signed   By: Prudencio Pair M.D.   On: 01/20/2019 02:09   Ct Abd/pelvis W/ Iv Contrast  Result Date: 01/20/2019 CLINICAL DATA:  Suspected covid, elevated LFTs, recent stroke EXAM: CT ANGIOGRAPHY CHEST, abdomen, and pelvis WITH CONTRAST TECHNIQUE: Multidetector CT imaging of the chest, abdomen, and pelvis was performed using the standard protocol during bolus administration of intravenous contrast. Multiplanar CT image reconstructions and MIPs were obtained to  evaluate the vascular anatomy. CONTRAST:  38mL OMNIPAQUE IOHEXOL 350 MG/ML SOLN COMPARISON:  CT chest September 13, 2017 FINDINGS: Cardiovascular: There is a optimal opacification of the pulmonary arteries. There is a small nonocclusive thrombus seen within the left main pulmonary artery which extends into the anterior left upper lobe segmental branches and partially occlusive thrombus in the posterior left lower lobe subsegmental branches. There is also partially occlusive thrombus seen within the posterior right upper lobe segmental branch, series 6, image 37. There is also partially occlusive thrombus  seen extending into the right middle lobe subsegmental branches and posterior right lower lobe subsegmental branches. The heart is normal in size. No pericardial effusion or thickening. There is slight straightening of the interventricular septum which could be due to early right ventricular heart strain. There is normal three-vessel brachiocephalic anatomy without proximal stenosis. Scattered atherosclerosis is noted. The thoracic aorta is normal in appearance. Mediastinum/Nodes: No hilar, mediastinal, or axillary adenopathy. Thyroid gland, trachea, and esophagus demonstrate no significant findings. Lungs/Pleura: Small subcentimeter pulmonary nodule are again identified as on prior exam dating back to 2019, for example in the posterior right upper lobe there is a 5 mm pulmonary nodule, series 8, image 21 and anterior right lower lobe, series 8, image 54 a 4 mm pulmonary nodule. There is however a new small nodular opacity in the posterior left upper lobe, series 8, image 47 measuring 7 mm not seen on prior exam. No large airspace consolidation or pleural effusion is seen. Centrilobular emphysematous changes seen within both lung apices. Musculoskeletal: No chest wall abnormality. No acute or significant osseous findings. Review of the MIP images confirms the above findings. Abdomen/pelvis: Hepatobiliary: There are  multiple low-density liver lesions seen throughout which were not seen on the prior exam. There is also heterogeneous enhancement seen throughout the liver parenchyma. Central periportal edema is noted. The portal vein is patent. The patient is status post cholecystectomy. A small amount of perihepatic ascites is noted. Pancreas: Unremarkable. No pancreatic ductal dilatation or surrounding inflammatory changes. Spleen: Normal in size without focal abnormality. Adrenals/Urinary Tract: Both adrenal glands appear normal. The kidneys and collecting system appear normal without evidence of urinary tract calculus or hydronephrosis. Bladder is unremarkable. Stomach/Bowel: The stomach, small bowel, and colon are normal in appearance. No inflammatory changes, wall thickening, or obstructive findings. Vascular/Lymphatic: There are no enlarged mesenteric, retroperitoneal, or pelvic lymph nodes. Scattered aortic atherosclerotic calcifications are seen without aneurysmal dilatation. Reproductive: The uterus and adnexa are unremarkable. Other: No evidence of abdominal wall mass or hernia. Musculoskeletal: No acute or significant osseous findings. IMPRESSION: 1. Bilateral partially occlusive segmental and subsegmental pulmonary emboli as described above. 2. Findings which could be suggestive of early right ventricular heart strain. 3. Multiple unchanged subcentimeter pulmonary nodules as on prior exam dating back to 2019. 4. New nodular opacity within the posterior left upper lobe measuring 7 mm which is nonspecific, and would recommend attention on short interval follow-up exam. 5. Centrilobular emphysematous changes. 6. Numerous hepatic lesions throughout, likely suggestive of hepatic metastatic disease. 7. Small amount of perihepatic ascites 8. These results were called by telephone at the time of interpretation on 01/20/2019 at 2:07 am to provider [facility] , who verbally acknowledged these results. Electronically Signed    By: Prudencio Pair M.D.   On: 01/20/2019 02:09   Dg Chest Portable 1 View  Result Date: 01/19/2019 CLINICAL DATA:  Shortness of breath EXAM: PORTABLE CHEST 1 VIEW COMPARISON:  12/31/2018 FINDINGS: Cardiomegaly. Lungs clear. No effusions or edema. No acute bony abnormality. IMPRESSION: Cardiomegaly.  No active disease. Electronically Signed   By: Rolm Baptise M.D.   On: 01/19/2019 23:33    EKG: sinus, poor r wave progression  twave change in anterior leads   Assessment/Plan Active Problems:   * No active hospital problems. * Patient is 68 year old female past medical significant for diabetes CVA hypertension COPD hyperlipidemia who presents to the ED with increasing shortness of breath over the last 4 to 5 days and further evaluation patient was found to have  pulmonary embolus with associated hypoxemia requiring supplemental O2.  Patient also on evaluation through imaging was noted to have liver lesions which are consistent with  malignancy.  Patient is admitted for further evaluation and treatment   Pulmonary embolism with acute hypoxic respiratory failure requiring supplemental O2 -Case discussed with on-call intensivist who recommended anticoagulation with heparin bolus and heparin drip -Cardiogram in a.m. to evaluate for cardiac strain   New liver lesions with malignancy -associated elevated lfts -Oncology consult to be placed in am   Abnormal ekg  -cycle ce, echo in am   History of recent cerebellar CVA -Per critical care okay to anticoagulate -Hold antiplatelet at this time  Hypertension -Resume home medications as blood pressure tolerates -Blood pressure is stable  CRI -cr at new baseline from prior recent insult on last hospitailzation  Diabetes type 2 -Place insulin sliding scale fingersticks -Hold oral hypoglycemics at this time -Resume Lantus  COPD -No current flare -Controller medications  HLD -Resume statin   FEN Replete lytes as needed  DVT  prophylaxis: Heparin full dose  code Status: Full code  family Communication: N/A Disposition Plan: expected to be admitted greater than 2 midnights  Consults called:n/a Admission status: inpatient   Clance Boll MD [facility] Pager [phone]  If 7PM-7AM, please contact night-coverage [email or web address] Password [email or web address]  01/20/2019, 3:41 AM

## 2019-01-21 ENCOUNTER — Other Ambulatory Visit (INDEPENDENT_AMBULATORY_CARE_PROVIDER_SITE_OTHER): Payer: Self-pay | Admitting: Vascular Surgery

## 2019-01-21 ENCOUNTER — Encounter: Payer: Self-pay | Admitting: Family Medicine

## 2019-01-21 DIAGNOSIS — I2699 Other pulmonary embolism without acute cor pulmonale: Secondary | ICD-10-CM

## 2019-01-21 DIAGNOSIS — D649 Anemia, unspecified: Secondary | ICD-10-CM

## 2019-01-21 DIAGNOSIS — I2694 Multiple subsegmental pulmonary emboli without acute cor pulmonale: Principal | ICD-10-CM

## 2019-01-21 DIAGNOSIS — C787 Secondary malignant neoplasm of liver and intrahepatic bile duct: Secondary | ICD-10-CM

## 2019-01-21 DIAGNOSIS — I82403 Acute embolism and thrombosis of unspecified deep veins of lower extremity, bilateral: Secondary | ICD-10-CM

## 2019-01-21 DIAGNOSIS — J9601 Acute respiratory failure with hypoxia: Secondary | ICD-10-CM

## 2019-01-21 DIAGNOSIS — K769 Liver disease, unspecified: Secondary | ICD-10-CM

## 2019-01-21 DIAGNOSIS — R7989 Other specified abnormal findings of blood chemistry: Secondary | ICD-10-CM

## 2019-01-21 DIAGNOSIS — J984 Other disorders of lung: Secondary | ICD-10-CM

## 2019-01-21 HISTORY — DX: Acute embolism and thrombosis of unspecified deep veins of lower extremity, bilateral: I82.403

## 2019-01-21 LAB — C-REACTIVE PROTEIN: CRP: 4.1 mg/dL — ABNORMAL HIGH (ref <1.0)

## 2019-01-21 LAB — CBC
HCT: 25.3 % — ABNORMAL LOW (ref 36.0–46.0)
Hemoglobin: 8.3 g/dL — ABNORMAL LOW (ref 12.0–15.0)
MCH: 23.1 pg — ABNORMAL LOW (ref 26.0–34.0)
MCHC: 32.8 g/dL (ref 30.0–36.0)
MCV: 70.5 fL — ABNORMAL LOW (ref 80.0–100.0)
Platelets: 351 10*3/uL (ref 150–400)
RBC: 3.59 MIL/uL — ABNORMAL LOW (ref 3.87–5.11)
RDW: 14.8 % (ref 11.5–15.5)
WBC: 9.4 10*3/uL (ref 4.0–10.5)
nRBC: 0 % (ref 0.0–0.2)

## 2019-01-21 LAB — BASIC METABOLIC PANEL
Anion gap: 9 (ref 5–15)
BUN: 22 mg/dL (ref 8–23)
CO2: 19 mmol/L — ABNORMAL LOW (ref 22–32)
Calcium: 8.7 mg/dL — ABNORMAL LOW (ref 8.9–10.3)
Chloride: 110 mmol/L (ref 98–111)
Creatinine, Ser: 1.42 mg/dL — ABNORMAL HIGH (ref 0.44–1.00)
GFR calc Af Amer: 44 mL/min — ABNORMAL LOW (ref >60)
GFR calc non Af Amer: 38 mL/min — ABNORMAL LOW (ref >60)
Glucose, Bld: 56 mg/dL — ABNORMAL LOW (ref 70–99)
Potassium: 4.3 mmol/L (ref 3.5–5.1)
Sodium: 138 mmol/L (ref 135–145)

## 2019-01-21 LAB — URINE CULTURE

## 2019-01-21 LAB — IRON AND TIBC
Iron: 26 ug/dL — ABNORMAL LOW (ref 28–170)
Saturation Ratios: 8 % — ABNORMAL LOW (ref 10.4–31.8)
TIBC: 334 ug/dL (ref 250–450)
UIBC: 308 ug/dL

## 2019-01-21 LAB — GLUCOSE, CAPILLARY
Glucose-Capillary: 49 mg/dL — ABNORMAL LOW (ref 70–99)
Glucose-Capillary: 71 mg/dL (ref 70–99)
Glucose-Capillary: 100 mg/dL — ABNORMAL HIGH (ref 70–99)
Glucose-Capillary: 274 mg/dL — ABNORMAL HIGH (ref 70–99)
Glucose-Capillary: 287 mg/dL — ABNORMAL HIGH (ref 70–99)

## 2019-01-21 LAB — RETICULOCYTES
Immature Retic Fract: 22.2 % — ABNORMAL HIGH (ref 2.3–15.9)
RBC.: 3.85 MIL/uL — ABNORMAL LOW (ref 3.87–5.11)
Retic Count, Absolute: 138.5 10*3/uL (ref 19.0–186.0)
Retic Ct Pct: 3.6 % — ABNORMAL HIGH (ref 0.4–3.1)

## 2019-01-21 LAB — HEPARIN LEVEL (UNFRACTIONATED): Heparin Unfractionated: 0.37 IU/mL (ref 0.30–0.70)

## 2019-01-21 MED ORDER — INSULIN GLARGINE 100 UNIT/ML ~~LOC~~ SOLN
12.0000 [IU] | Freq: Every day | SUBCUTANEOUS | Status: DC
  Filled 2019-01-21: qty 0.12

## 2019-01-21 MED ORDER — GABAPENTIN 300 MG PO CAPS
600.0000 mg | ORAL_CAPSULE | Freq: Three times a day (TID) | ORAL | Status: DC
  Administered 2019-01-21 – 2019-01-25 (×11): 600 mg via ORAL
  Filled 2019-01-21 (×13): qty 2

## 2019-01-21 MED ORDER — INSULIN GLARGINE 100 UNIT/ML ~~LOC~~ SOLN
10.0000 [IU] | Freq: Every day | SUBCUTANEOUS | Status: DC
  Administered 2019-01-21: 10 [IU] via SUBCUTANEOUS
  Filled 2019-01-21 (×3): qty 0.1

## 2019-01-21 MED ORDER — SODIUM CHLORIDE 0.9 % IV SOLN
INTRAVENOUS | Status: DC
  Administered 2019-01-21: 125 mL/h via INTRAVENOUS
  Administered 2019-01-21 – 2019-01-22 (×2): via INTRAVENOUS

## 2019-01-21 MED ORDER — CHLORHEXIDINE GLUCONATE CLOTH 2 % EX PADS
6.0000 | MEDICATED_PAD | Freq: Every day | CUTANEOUS | Status: DC
  Administered 2019-01-21 – 2019-01-23 (×2): 6 via TOPICAL

## 2019-01-21 NOTE — Progress Notes (Signed)
Inpatient Diabetes Program Recommendations  AACE/ADA: New Consensus Statement on Inpatient Glycemic Control (2015)  Target Ranges:  Prepandial:   less than 140 mg/dL      Peak postprandial:   less than 180 mg/dL (1-2 hours)      Critically ill patients:  140 - 180 mg/dL   Results for Audrey Chan, Audrey Chan (MRN [id number]) as of 01/21/2019 09:06  Ref. Range 01/20/2019 09:46 01/20/2019 12:19 01/20/2019 16:38 01/20/2019 22:00  Glucose-Capillary Latest Ref Range: 70 - 99 mg/dL 112 (H) 104 (H) 203 (H)  3 units NOVOLOG  97    25 units LANTUS   Results for Audrey Chan, Audrey Chan (MRN [id number]) as of 01/21/2019 09:06  Ref. Range 01/21/2019 07:30  Glucose-Capillary Latest Ref Range: 70 - 99 mg/dL 49 (L)    Admit with: Bilateral Pulmonary Embolus/ Nodular opacity posterior left upper lobe 7 mm, multiple unchanged subcentimeter pulmonary nodules stable since 2019, liver lesions seen on CT concerning for metastatic disease  History: DM, CVA, COPD  Home DM Meds: Lantus 25 units QHS       Novolog 2-8 units TID AC per SSI       Metformin 1000 mg Daily  Current Orders: Lantus 12 units QHS     Novolog Sensitive Correction Scale/ SSI (0-9 units) TID AC    MD- Note patient with Hypoglycemia this AM after getting 25 units Lantus last PM.  Per record review, only supposed to be taking Lantus 20 units QHS  Agree with reduction, although patient may need more Lantus at some point.  May also need Novolog Meal Coverage once she restarts PO diet.  Will follow daily   Hospitalized from 11/09 through 11/13 with DKA and CVA--Discharged home on Lantus 20 units QHS + Metformin 1000 mg Daily.  Endocrinologist: Dr. Gabriel Carina with [facility] 01/08/2019 after her hospitalization--Was told to take the following: Lantus 20 units QHS Apidra Insulin: Take 5 units of Apidra if your sugar is 80 - 150 Take 6 units of Apidra if your sugar is 150-210 Take 7 units of Apidra if your sugar is 211-270 Take 7 units of  Apidra if your sugar is 271-330 Take 8 units of Apidra if your sugar is 331-390 Take 9 units of Apidra if your sugar is over 391     --Will follow patient during hospitalization--  Wyn Quaker RN, MSN, CDE Diabetes Coordinator Inpatient Glycemic Control Team Team Pager: [phone] (8a-5p)

## 2019-01-21 NOTE — ED Notes (Addendum)
IR unable to fit patient in for liver biopsy today, scheduled thrombectomy for tomorrow. Secure chat admitting, Dr. Sarajane Jews to inform and states that pt can eat today, start NPO at midnight. Patient appropriate diet order placed.

## 2019-01-21 NOTE — Consult Note (Signed)
[facility] CONSULT NOTE  Patient Care Team: Revelo, Elyse Jarvis, MD as PCP - General (Family Medicine) Flora Lipps, MD (Pulmonary Disease)  CHIEF COMPLAINTS/PURPOSE OF CONSULTATION: Liver lesions/acute PE  HISTORY OF PRESENTING ILLNESS:  Audrey Chan 68 y.o.  female history of CKD/diabetes; and former smoker-is currently admitted to hospital for worsening shortness of breath.  In the emergency room patient had CTA of the chest that showed bilateral PE with right heart strain.  CT chest showed subcentimeter lung nodules [stable from 2019]; no hilar or mediastinal adenopathy..  CT scan abdomen pelvis showed-multiple liver lesions-again concerning for malignancy.  Patient is currently on IV heparin.  Her breathing is improved however continues to need nasal cannula oxygen.  She is awaiting thrombectomy morning tomorrow.  Of note patient was recently [November [id number] admitted to hospital for strokelike symptoms-MRI showed acute infarct medial cerebellum.  Patient sister moved to the patient's house-to help take care of her.  Patient previously worked in [employer].  Patient complains of mild crampy pain in the right lower extremity.  Patient states to have mammogram in March 2020 normal.  Had a colonoscopy about a year ago.   Patient otherwise denies any blood in stools or black or stools.  Review of Systems  Constitutional: Positive for malaise/fatigue and weight loss. Negative for chills, diaphoresis and fever.  HENT: Negative for nosebleeds and sore throat.   Eyes: Negative for double vision.  Respiratory: Positive for shortness of breath. Negative for cough, hemoptysis, sputum production and wheezing.   Cardiovascular: Negative for chest pain, palpitations, orthopnea and leg swelling.  Gastrointestinal: Positive for nausea. Negative for abdominal pain, blood in stool, constipation, diarrhea, heartburn, melena and vomiting.  Genitourinary: Negative for  dysuria, frequency and urgency.  Musculoskeletal: Negative for back pain and joint pain.  Skin: Negative.  Negative for itching and rash.  Neurological: Negative for dizziness, tingling, focal weakness, weakness and headaches.  Endo/Heme/Allergies: Does not bruise/bleed easily.  Psychiatric/Behavioral: Negative for depression. The patient is not nervous/anxious and does not have insomnia.      MEDICAL HISTORY:  Past Medical History:  Diagnosis Date  . Allergic rhinitis   . Aneurysm of unspecified site ([place])   . Cholelithiasis   . Diabetes type 2, controlled ([place])   . GERD (gastroesophageal reflux disease)   . Hypertension   . Leg DVT (deep venous thromboembolism), acute, bilateral ([place]) 01/21/2019  . Multiple subsegmental pulmonary emboli without acute cor pulmonale ([place]) 01/20/2019  . Neuropathy   . Veno-occlusive disease     SURGICAL HISTORY: Past Surgical History:  Procedure Laterality Date  . ABDOMINAL HYSTERECTOMY  1972  . CERVICAL FUSION    . CHOLECYSTECTOMY N/A 12/28/2014   Procedure: LAPAROSCOPIC CHOLECYSTECTOMY;  Surgeon: Sherri Rad, MD;  Location: [facility] ORS;  Service: General;  Laterality: N/A;  . EYE SURGERY     detached retina  . MASS EXCISION Left 05/06/2018   Procedure: EXCISION OF LEFT ANTECUBITAL CYST, DIABETIC;  Surgeon: Vickie Epley, MD;  Location: [facility] ORS;  Service: Vascular;  Laterality: Left;    SOCIAL HISTORY: Social History   Socioeconomic History  . Marital status: Widowed    Spouse name: Not on file  . Number of children: 1  . Years of education: Not on file  . Highest education level: Not on file  Occupational History  . Not on file  Social Needs  . Financial resource strain: Not on file  . Food insecurity    Worry: Not on file  Inability: Not on file  . Transportation needs    Medical: Not on file    Non-medical: Not on file  Tobacco Use  . Smoking status: Former Smoker    Quit date: 02/21/1999    Years since quitting: 19.9  .  Smokeless tobacco: Never Used  Substance and Sexual Activity  . Alcohol use: No    Alcohol/week: 0.0 standard drinks  . Drug use: No  . Sexual activity: Not on file  Lifestyle  . Physical activity    Days per week: Not on file    Minutes per session: Not on file  . Stress: Not on file  Relationships  . Social Herbalist on phone: Not on file    Gets together: Not on file    Attends religious service: Not on file    Active member of club or organization: Not on file    Attends meetings of clubs or organizations: Not on file    Relationship status: Not on file  . Intimate partner violence    Fear of current or ex partner: Not on file    Emotionally abused: Not on file    Physically abused: Not on file    Forced sexual activity: Not on file  Other Topics Concern  . Not on file  Social History Narrative  . Not on file    FAMILY HISTORY: Family History  Problem Relation Age of Onset  . Lung cancer Father     ALLERGIES:  has No Known Allergies.  MEDICATIONS:  Current Facility-Administered Medications  Medication Dose Route Frequency Provider Last Rate Last Dose  . 0.9 %  sodium chloride infusion   Intravenous Continuous Stegmayer, Kimberly A, PA-C 125 mL/hr at 01/21/19 1215    . acetaminophen (TYLENOL) tablet 650 mg  650 mg Oral Q6H PRN Clance Boll, MD       Or  . acetaminophen (TYLENOL) suppository 650 mg  650 mg Rectal Q6H PRN Myles Rosenthal A, MD      . albuterol (VENTOLIN HFA) 108 (90 Base) MCG/ACT inhaler 2 puff  2 puff Inhalation Q6H PRN Myles Rosenthal A, MD      . amLODipine (NORVASC) tablet 5 mg  5 mg Oral Daily Clance Boll, MD   Stopped at 01/21/19 [phone]  . cholecalciferol (VITAMIN D) tablet 1,000 Units  1,000 Units Oral Daily Clance Boll, MD   1,000 Units at 01/21/19 0914  . ferrous sulfate tablet 325 mg  325 mg Oral BID WC Myles Rosenthal A, MD   325 mg at 01/21/19 0802  . fluticasone (FLONASE) 50 MCG/ACT nasal spray 2  spray  2 spray Each Nare Daily PRN Clance Boll, MD      . gabapentin (NEURONTIN) capsule 600 mg  600 mg Oral TID Samuella Cota, MD      . heparin ADULT infusion 100 units/mL (25000 units/227mL sodium chloride 0.45%)  950 Units/hr Intravenous Continuous Hall, Scott A, RPH 9.5 mL/hr at 01/21/19 1057 950 Units/hr at 01/21/19 1057  . insulin aspart (novoLOG) injection 0-9 Units  0-9 Units Subcutaneous TID WC Clance Boll, MD   Stopped at 01/21/19 0802  . insulin glargine (LANTUS) injection 10 Units  10 Units Subcutaneous QHS Samuella Cota, MD      . loratadine (CLARITIN) tablet 10 mg  10 mg Oral Daily Myles Rosenthal A, MD   10 mg at 01/21/19 0913  . metoprolol tartrate (LOPRESSOR) tablet 12.5 mg  12.5 mg Oral BID  Clance Boll, MD   Stopped at 01/21/19 [phone]  . multivitamin with minerals tablet 1 tablet  1 tablet Oral Daily Clance Boll, MD   1 tablet at 01/21/19 0914  . polyethylene glycol (MIRALAX / GLYCOLAX) packet 17 g  1 packet Oral Daily PRN Myles Rosenthal A, MD      . rosuvastatin (CRESTOR) tablet 20 mg  20 mg Oral Daily Myles Rosenthal A, MD   20 mg at 01/21/19 0913  . umeclidinium-vilanterol (ANORO ELLIPTA) 62.5-25 MCG/INH 1 puff  1 puff Inhalation Daily Clance Boll, MD   1 puff at 01/21/19 [id number]   Current Outpatient Medications  Medication Sig Dispense Refill  . acetaminophen (TYLENOL) 500 MG tablet Take 500-1,000 mg by mouth every 6 (six) hours as needed for moderate pain.     Marland Kitchen albuterol (PROVENTIL HFA;VENTOLIN HFA) 108 (90 BASE) MCG/ACT inhaler Inhale 2 puffs into the lungs every 6 (six) hours as needed for wheezing or shortness of breath.     Marland Kitchen amLODipine (NORVASC) 5 MG tablet Take 1 tablet (5 mg total) by mouth daily. 30 tablet 0  . aspirin EC 81 MG tablet Take 81 mg by mouth daily.    . cetirizine (ZYRTEC) 10 MG tablet Take 10 mg by mouth daily.     . Cholecalciferol (VITAMIN D3) 1000 units CAPS Take 1,000 Units by mouth daily.     .  clopidogrel (PLAVIX) 75 MG tablet Take 75 mg by mouth daily.    . ferrous sulfate 325 (65 FE) MG tablet Take 325 mg by mouth 2 (two) times daily with a meal.     . fluticasone (FLONASE) 50 MCG/ACT nasal spray Place 2 sprays into both nostrils daily as needed for allergies or rhinitis.     Marland Kitchen gabapentin (NEURONTIN) 800 MG tablet Take 800 mg by mouth 3 (three) times daily.     . hydrochlorothiazide (HYDRODIURIL) 25 MG tablet Take 25 mg by mouth daily.    . insulin aspart (NOVOLOG) 100 UNIT/ML injection Check blood sugar prior to meals and give insulin on the following scale: Blood sugar less than 150: 0 units BS 150-200: 2 units BS 200-250: 3 units BS 250-300: 4 units BS 300-350: 5 units BS 350-400: 6 units BS above 400: 8 units and call your doctor. 10 mL 1  . LANTUS SOLOSTAR 100 UNIT/ML Solostar Pen Inject 25 Units into the skin at bedtime. 15 mL 0  . lisinopril (ZESTRIL) 10 MG tablet Take 2 tablets (20 mg total) by mouth daily. 30 tablet 0  . metFORMIN (GLUCOPHAGE-XR) 500 MG 24 hr tablet Take 1,000 mg by mouth daily.     . metoprolol tartrate (LOPRESSOR) 25 MG tablet Take 12.5 mg by mouth 2 (two) times daily.    . Multiple Vitamin (MULTI-VITAMINS) TABS Take 1 tablet by mouth daily.    . polyethylene glycol powder (GLYCOLAX/MIRALAX) powder Take 0.5 Containers by mouth daily as needed for moderate constipation.     Marland Kitchen umeclidinium-vilanterol (ANORO ELLIPTA) 62.5-25 MCG/INH AEPB Inhale 1 puff into the lungs daily.    . APIDRA SOLOSTAR 100 UNIT/ML Solostar Pen Inject 0-50 Units into the skin.    Marland Kitchen diclofenac (FLECTOR) 1.3 % PTCH Apply 1 patch topically 2 (two) times daily. Apply 1 patch twice a day.    . mometasone (ELOCON) 0.1 % cream Apply 1 application topically 2 (two) times daily as needed (rash).     . rosuvastatin (CRESTOR) 20 MG tablet Take 20 mg by mouth daily.         Marland Kitchen  PHYSICAL EXAMINATION:  Vitals:   01/21/19 0801 01/21/19 1138  BP: 127/79 127/77  Pulse: 96 90  Resp: 20 18   Temp: 97.6 F (36.4 C)   SpO2: 96% 100%   Filed Weights   01/19/19 2300  Weight: 122 lb (55.3 kg)    Physical Exam  Constitutional: She is oriented to person, place, and time and well-developed, well-nourished, and in no distress.  Cachectic appearing.  Alone.  HENT:  Head: Normocephalic and atraumatic.  Mouth/Throat: Oropharynx is clear and moist. No oropharyngeal exudate.  Eyes: Pupils are equal, round, and reactive to light.  Neck: Normal range of motion. Neck supple.  Cardiovascular: Normal rate and regular rhythm.  Pulmonary/Chest: No respiratory distress. She has no wheezes.  Decreased air entry.  Abdominal: Soft. Bowel sounds are normal. She exhibits no distension and no mass. There is no abdominal tenderness. There is no rebound and no guarding.  Musculoskeletal: Normal range of motion.        General: No tenderness or edema.  Neurological: She is alert and oriented to person, place, and time.  Skin: Skin is warm.  Psychiatric: Affect normal.   LABORATORY DATA:  I have reviewed the data as listed Lab Results  Component Value Date   WBC 9.4 01/21/2019   HGB 8.3 (L) 01/21/2019   HCT 25.3 (L) 01/21/2019   MCV 70.5 (L) 01/21/2019   PLT 351 01/21/2019   Recent Labs    12/31/18 0444  01/19/19 2325 01/20/19 1120 01/21/19 0717  NA 139   < > 130* 135 138  K 3.1*   < > 4.7 4.8 4.3  CL 111   < > 97* 104 110  CO2 19*   < > 17* 20* 19*  GLUCOSE 186*   < > 181* 121* 56*  BUN 17   < > 28* 24* 22  CREATININE 1.27*   < > 1.58* 1.33* 1.42*  CALCIUM 8.2*   < > 9.2 8.8* 8.7*  GFRNONAA 43*   < > 33* 41* 38*  GFRAA 50*   < > 38* 47* 44*  PROT 6.4*  --  8.6* 7.3  --   ALBUMIN 3.0*  --  3.7 3.1*  --   AST 81*  --  145* 82*  --   ALT 45*  --  118* 83*  --   ALKPHOS 203*  --  1,044* 851*  --   BILITOT 0.5  --  0.6 0.5  --   BILIDIR <0.1  --   --   --   --   IBILI NOT CALCULATED  --   --   --   --    < > = values in this interval not displayed.    RADIOGRAPHIC  STUDIES: I have personally reviewed the radiological images as listed and agreed with the findings in the report. Ct Head Wo Contrast  Result Date: 12/31/2018 CLINICAL DATA:  Seizure EXAM: CT HEAD WITHOUT CONTRAST TECHNIQUE: Contiguous axial images were obtained from the base of the skull through the vertex without intravenous contrast. COMPARISON:  None. FINDINGS: Brain: There is no acute intracranial hemorrhage, mass-effect, or edema. There is no new loss of gray-white differentiation. Confluent areas of hypoattenuation in the supratentorial white matter are nonspecific but likely reflect stable advanced microvascular ischemic changes. Chronic small infarcts of the left cerebellum and thalamus again noted. There is no extra-axial collection. Ventricles are stable in size. Vascular: No hyperdense vessel. Skull: Calvarium is unremarkable. Sinuses/Orbits: Mild paranasal sinus mucosal thickening. Orbits  are unremarkable. Other: Mastoid air cells are clear. IMPRESSION: No acute or new findings since December 29, 2018. Stable chronic findings detailed above. Electronically Signed   By: Macy Mis M.D.   On: 12/31/2018 13:15   Ct Head Wo Contrast  Result Date: 12/29/2018 CLINICAL DATA:  Loss of consciousness EXAM: CT HEAD WITHOUT CONTRAST TECHNIQUE: Contiguous axial images were obtained from the base of the skull through the vertex without intravenous contrast. COMPARISON:  None. FINDINGS: Brain: No evidence of acute territorial infarction, hemorrhage, hydrocephalus,extra-axial collection or mass lesion/mass effect. The ventricles are normal in size and contour. Low-attenuation changes in the deep white matter consistent with small vessel ischemia. Vascular: No hyperdense vessel or unexpected calcification. Skull: The skull is intact. No fracture or focal lesion identified. Sinuses/Orbits: The visualized paranasal sinuses and mastoid air cells are clear. The orbits and globes intact. Other: None IMPRESSION:  No acute intracranial abnormality. Findings consistent with chronic small vessel ischemia Electronically Signed   By: Prudencio Pair M.D.   On: 12/29/2018 23:08   Ct Angio Chest Pe W/cm &/or Wo Cm  Result Date: 01/20/2019 CLINICAL DATA:  Suspected covid, elevated LFTs, recent stroke EXAM: CT ANGIOGRAPHY CHEST, abdomen, and pelvis WITH CONTRAST TECHNIQUE: Multidetector CT imaging of the chest, abdomen, and pelvis was performed using the standard protocol during bolus administration of intravenous contrast. Multiplanar CT image reconstructions and MIPs were obtained to evaluate the vascular anatomy. CONTRAST:  35mL OMNIPAQUE IOHEXOL 350 MG/ML SOLN COMPARISON:  CT chest September 13, 2017 FINDINGS: Cardiovascular: There is a optimal opacification of the pulmonary arteries. There is a small nonocclusive thrombus seen within the left main pulmonary artery which extends into the anterior left upper lobe segmental branches and partially occlusive thrombus in the posterior left lower lobe subsegmental branches. There is also partially occlusive thrombus seen within the posterior right upper lobe segmental branch, series 6, image 37. There is also partially occlusive thrombus seen extending into the right middle lobe subsegmental branches and posterior right lower lobe subsegmental branches. The heart is normal in size. No pericardial effusion or thickening. There is slight straightening of the interventricular septum which could be due to early right ventricular heart strain. There is normal three-vessel brachiocephalic anatomy without proximal stenosis. Scattered atherosclerosis is noted. The thoracic aorta is normal in appearance. Mediastinum/Nodes: No hilar, mediastinal, or axillary adenopathy. Thyroid gland, trachea, and esophagus demonstrate no significant findings. Lungs/Pleura: Small subcentimeter pulmonary nodule are again identified as on prior exam dating back to 2019, for example in the posterior right upper lobe  there is a 5 mm pulmonary nodule, series 8, image 21 and anterior right lower lobe, series 8, image 54 a 4 mm pulmonary nodule. There is however a new small nodular opacity in the posterior left upper lobe, series 8, image 47 measuring 7 mm not seen on prior exam. No large airspace consolidation or pleural effusion is seen. Centrilobular emphysematous changes seen within both lung apices. Musculoskeletal: No chest wall abnormality. No acute or significant osseous findings. Review of the MIP images confirms the above findings. Abdomen/pelvis: Hepatobiliary: There are multiple low-density liver lesions seen throughout which were not seen on the prior exam. There is also heterogeneous enhancement seen throughout the liver parenchyma. Central periportal edema is noted. The portal vein is patent. The patient is status post cholecystectomy. A small amount of perihepatic ascites is noted. Pancreas: Unremarkable. No pancreatic ductal dilatation or surrounding inflammatory changes. Spleen: Normal in size without focal abnormality. Adrenals/Urinary Tract: Both adrenal glands appear normal.  The kidneys and collecting system appear normal without evidence of urinary tract calculus or hydronephrosis. Bladder is unremarkable. Stomach/Bowel: The stomach, small bowel, and colon are normal in appearance. No inflammatory changes, wall thickening, or obstructive findings. Vascular/Lymphatic: There are no enlarged mesenteric, retroperitoneal, or pelvic lymph nodes. Scattered aortic atherosclerotic calcifications are seen without aneurysmal dilatation. Reproductive: The uterus and adnexa are unremarkable. Other: No evidence of abdominal wall mass or hernia. Musculoskeletal: No acute or significant osseous findings. IMPRESSION: 1. Bilateral partially occlusive segmental and subsegmental pulmonary emboli as described above. 2. Findings which could be suggestive of early right ventricular heart strain. 3. Multiple unchanged subcentimeter  pulmonary nodules as on prior exam dating back to 2019. 4. New nodular opacity within the posterior left upper lobe measuring 7 mm which is nonspecific, and would recommend attention on short interval follow-up exam. 5. Centrilobular emphysematous changes. 6. Numerous hepatic lesions throughout, likely suggestive of hepatic metastatic disease. 7. Small amount of perihepatic ascites 8. These results were called by telephone at the time of interpretation on 01/20/2019 at 2:07 am to provider [facility] , who verbally acknowledged these results. Electronically Signed   By: Prudencio Pair M.D.   On: 01/20/2019 02:09   Mr Jeri Cos [id number] Contrast  Result Date: 01/01/2019 CLINICAL DATA:  Encephalopathy.  DKA. EXAM: MRI HEAD WITHOUT AND WITH CONTRAST TECHNIQUE: Multiplanar, multiecho pulse sequences of the brain and surrounding structures were obtained without and with intravenous contrast. CONTRAST:  11mL GADAVIST GADOBUTROL 1 MMOL/ML IV SOLN COMPARISON:  CT head 12/31/2018 FINDINGS: Brain: Small acute infarct left inferior cerebellum. No other acute infarct Chronic infarct left inferior cerebellum. Extensive white matter disease bilaterally. Diffuse subcortical and deep white matter hyperintensity throughout both cerebral hemispheres. Hyperintensity is also noted in the right thalamus. Negative for hemorrhage or mass. Ventricle size normal. Normal enhancement postcontrast administration Vascular: Normal arterial flow voids Skull and upper cervical spine: No focal bony abnormality. Cervical spine fusion hardware. Sinuses/Orbits: Negative Other: None IMPRESSION: 1. Acute small infarct left medial cerebellum. 2. Extensive disease throughout the white matter most compatible with chronic ischemia. Chronic infarct left cerebellum. Electronically Signed   By: Franchot Gallo M.D.   On: 01/01/2019 15:05   Ct Abd/pelvis W/ Iv Contrast  Result Date: 01/20/2019 CLINICAL DATA:  Suspected covid, elevated LFTs, recent stroke EXAM:  CT ANGIOGRAPHY CHEST, abdomen, and pelvis WITH CONTRAST TECHNIQUE: Multidetector CT imaging of the chest, abdomen, and pelvis was performed using the standard protocol during bolus administration of intravenous contrast. Multiplanar CT image reconstructions and MIPs were obtained to evaluate the vascular anatomy. CONTRAST:  39mL OMNIPAQUE IOHEXOL 350 MG/ML SOLN COMPARISON:  CT chest September 13, 2017 FINDINGS: Cardiovascular: There is a optimal opacification of the pulmonary arteries. There is a small nonocclusive thrombus seen within the left main pulmonary artery which extends into the anterior left upper lobe segmental branches and partially occlusive thrombus in the posterior left lower lobe subsegmental branches. There is also partially occlusive thrombus seen within the posterior right upper lobe segmental branch, series 6, image 37. There is also partially occlusive thrombus seen extending into the right middle lobe subsegmental branches and posterior right lower lobe subsegmental branches. The heart is normal in size. No pericardial effusion or thickening. There is slight straightening of the interventricular septum which could be due to early right ventricular heart strain. There is normal three-vessel brachiocephalic anatomy without proximal stenosis. Scattered atherosclerosis is noted. The thoracic aorta is normal in appearance. Mediastinum/Nodes: No hilar, mediastinal, or axillary adenopathy. Thyroid  gland, trachea, and esophagus demonstrate no significant findings. Lungs/Pleura: Small subcentimeter pulmonary nodule are again identified as on prior exam dating back to 2019, for example in the posterior right upper lobe there is a 5 mm pulmonary nodule, series 8, image 21 and anterior right lower lobe, series 8, image 54 a 4 mm pulmonary nodule. There is however a new small nodular opacity in the posterior left upper lobe, series 8, image 47 measuring 7 mm not seen on prior exam. No large airspace  consolidation or pleural effusion is seen. Centrilobular emphysematous changes seen within both lung apices. Musculoskeletal: No chest wall abnormality. No acute or significant osseous findings. Review of the MIP images confirms the above findings. Abdomen/pelvis: Hepatobiliary: There are multiple low-density liver lesions seen throughout which were not seen on the prior exam. There is also heterogeneous enhancement seen throughout the liver parenchyma. Central periportal edema is noted. The portal vein is patent. The patient is status post cholecystectomy. A small amount of perihepatic ascites is noted. Pancreas: Unremarkable. No pancreatic ductal dilatation or surrounding inflammatory changes. Spleen: Normal in size without focal abnormality. Adrenals/Urinary Tract: Both adrenal glands appear normal. The kidneys and collecting system appear normal without evidence of urinary tract calculus or hydronephrosis. Bladder is unremarkable. Stomach/Bowel: The stomach, small bowel, and colon are normal in appearance. No inflammatory changes, wall thickening, or obstructive findings. Vascular/Lymphatic: There are no enlarged mesenteric, retroperitoneal, or pelvic lymph nodes. Scattered aortic atherosclerotic calcifications are seen without aneurysmal dilatation. Reproductive: The uterus and adnexa are unremarkable. Other: No evidence of abdominal wall mass or hernia. Musculoskeletal: No acute or significant osseous findings. IMPRESSION: 1. Bilateral partially occlusive segmental and subsegmental pulmonary emboli as described above. 2. Findings which could be suggestive of early right ventricular heart strain. 3. Multiple unchanged subcentimeter pulmonary nodules as on prior exam dating back to 2019. 4. New nodular opacity within the posterior left upper lobe measuring 7 mm which is nonspecific, and would recommend attention on short interval follow-up exam. 5. Centrilobular emphysematous changes. 6. Numerous hepatic lesions  throughout, likely suggestive of hepatic metastatic disease. 7. Small amount of perihepatic ascites 8. These results were called by telephone at the time of interpretation on 01/20/2019 at 2:07 am to provider [facility] , who verbally acknowledged these results. Electronically Signed   By: Prudencio Pair M.D.   On: 01/20/2019 02:09   US Venous Img Lower Bilateral (dvt)  Result Date: 01/20/2019 CLINICAL DATA:  History of pulmonary embolism, now with lower extremity pain. Evaluate for DVT. EXAM: BILATERAL LOWER EXTREMITY VENOUS DOPPLER ULTRASOUND TECHNIQUE: Gray-scale sonography with graded compression, as well as color Doppler and duplex ultrasound were performed to evaluate the lower extremity deep venous systems from the level of the common femoral vein and including the common femoral, femoral, profunda femoral, popliteal and calf veins including the posterior tibial, peroneal and gastrocnemius veins when visible. The superficial great saphenous vein was also interrogated. Spectral Doppler was utilized to evaluate flow at rest and with distal augmentation maneuvers in the common femoral, femoral and popliteal veins. COMPARISON:  Chest CTA-01/20/2019 FINDINGS: RIGHT LOWER EXTREMITY Common Femoral Vein: No evidence of thrombus. Normal compressibility, respiratory phasicity and response to augmentation. Saphenofemoral Junction: No evidence of thrombus. Normal compressibility and flow on color Doppler imaging. Profunda Femoral Vein: No evidence of thrombus. Normal compressibility and flow on color Doppler imaging. Femoral Vein: No evidence of thrombus. Normal compressibility, respiratory phasicity and response to augmentation. Popliteal Vein: No evidence of thrombus. Normal compressibility, respiratory phasicity and response  to augmentation. Calf Veins: There is hypoechoic occlusive thrombus within both paired right peroneal veins (images 58 and 59). Both paired right posterior tibial veins appear patent where  imaged. Superficial Great Saphenous Vein: No evidence of thrombus. Normal compressibility. Other Findings:  None. _________________________________________________________ LEFT LOWER EXTREMITY Common Femoral Vein: No evidence of thrombus. Normal compressibility, respiratory phasicity and response to augmentation. Saphenofemoral Junction: No evidence of thrombus. Normal compressibility and flow on color Doppler imaging. Profunda Femoral Vein: No evidence of thrombus. Normal compressibility and flow on color Doppler imaging. Femoral Vein: No evidence of thrombus. Normal compressibility, respiratory phasicity and response to augmentation. Popliteal Vein: No evidence of thrombus. Normal compressibility, respiratory phasicity and response to augmentation. Calf Veins: There is hypoechoic occlusive thrombus within 1 of the paired left peroneal veins (images 29 and 30). The adjacent paired left peroneal vein as well though both paired posterior tibial veins appear patent. Superficial Great Saphenous Vein: No evidence of thrombus. Normal compressibility. Other Findings:  None. IMPRESSION: Examination is positive for occlusive DVT involving both paired right peroneal veins as well as one of two paired left peroneal veins. There is no extension of this distal tibial DVT to the more proximal venous system of either lower extremity. Electronically Signed   By: [name] Mariscal M.D.   On: 01/20/2019 17:02   Dg Chest Portable 1 View  Result Date: 01/19/2019 CLINICAL DATA:  Shortness of breath EXAM: PORTABLE CHEST 1 VIEW COMPARISON:  12/31/2018 FINDINGS: Cardiomegaly. Lungs clear. No effusions or edema. No acute bony abnormality. IMPRESSION: Cardiomegaly.  No active disease. Electronically Signed   By: Rolm Baptise M.D.   On: 01/19/2019 23:33   Dg Chest Port 1 View  Result Date: 12/31/2018 CLINICAL DATA:  Sepsis EXAM: PORTABLE CHEST 1 VIEW COMPARISON:  December 29, 2018 which is FINDINGS: The heart size and mediastinal contours  are unchanged. Again noted is prominence to the central pulmonary vasculature. There is mildly increased interstitial markings seen at both lower lungs slightly more pronounced than the prior exam. No pleural effusion. No acute osseous abnormality. IMPRESSION: Pulmonary vascular congestion and mildly increased interstitial markings at both lower lungs, slightly more pronounced than the prior exam. This could be due to interstitial edema and/or early infectious etiology. Electronically Signed   By: Prudencio Pair M.D.   On: 12/31/2018 03:38   Dg Chest Port 1 View  Result Date: 12/29/2018 CLINICAL DATA:  Altered mental status EXAM: PORTABLE CHEST 1 VIEW COMPARISON:  November 12, 2016 FINDINGS: The heart size and mediastinal contours are within normal limits. There is prominence to the central pulmonary vasculature. No large airspace consolidation or pleural effusion. IMPRESSION: Findings suggestive pulmonary vascular congestion. Electronically Signed   By: Prudencio Pair M.D.   On: 12/29/2018 21:16    [name] #68 year old female patient with no prior history of malignancy/or PE-is currently admitted to hospital for acute shortness of breath.  CT chest shows acute PE; multiple liver lesions  #Multiple liver lesions-with subcentimeter lung nodules-suspicious of malignancy/given history of smoking/weight loss.  Primary is unclear based upon imaging at this time.  Check tumor markers.  Discussed with interventional radiology-plan to proceed with IR biopsy after thrombectomy in the morning tomorrow.  Also discussed with Dr.Aleskerov.   #Acute PE-right heart strain; patient awaiting thrombectomy tomorrow.  Not candidate for thrombolysis given the recent stroke.  Currently on IV heparin; patient would potentially switched over to Eliquis after procedures are complete/patient clinically stable.  Patient will need lifelong anticoagulation if patient has underlying  malignancy.  #Microcytic anemia-unclear  etiology-anemia of chronic disease [more likely given elevated ferritin >1000] vs. anemia of iron deficiency [Baseline 10-11 hemoglobin]; -currently 8.3.  Check iron studies.   #CKD stage III/diabetes-on insulin.  Thank you for allowing me to participate in the care of your pleasant patient. Please do not hesitate to contact me with questions or concerns in the interim.    All questions were answered. The patient knows to call the clinic with any problems, questions or concerns.    [name], MD 01/21/2019 4:10 PM

## 2019-01-21 NOTE — Progress Notes (Signed)
PROGRESS NOTE  Audrey Chan [id number] DOB: [birth year] DOA: 01/19/2019 PCP: Theotis Burrow, MD  Brief History   68 year old woman PMH diabetes mellitus type 2 with neuropathy, CVA, COPD, recent complex hospitalization in which she was treated for DKA, sepsis and cerebellar stroke without apparent sequela; PRESENTED 11/29 with shortness of breath 4-5 days without any fever chills or cough or chest pain.  Hypoxic on room air, CT chest showed multiple PE in addition to what appears to be metastatic lesions in the liver of unknown primary.  Admitted for treatment of PE and further evaluation of possible metastatic disease.  Seen by pulmonology vascular surgery.  Continues on heparin with plans for pulmonary thrombectomy 12/2.  A & P  Bilateral pulmonary embolism with suspected right heart strain by CT, supported by markedly elevated troponin, with associated acute hypoxic respiratory failure, in the context of suspected metastatic disease, new diagnosis.  BNP 876 on admission. --Remains hypoxic, 96% on 4 L, given extensive clot burden with right ventricular dysfunction, vascular surgery consulted and recommends pulmonary thrombectomy. --Continue anticoagulation.  Appreciate pulmonology and vascular surgery involvement. --Suspect VTE related to suspected malignancy and recent hospitalization  Bilateral lower extremity DVT. --No significant symptoms.  Vascular surgery recommends against any intervention. --Continue anticoagulation  Nodular opacity posterior left upper lobe 7 mm, multiple unchanged subcentimeter pulmonary nodules stable since 2019, liver lesions seen on CT concerning for metastatic disease, with associated marked elevation of alkaline phosphatase, modest elevation of AST, ALT.  Total bilirubin within normal limits. --No biopsy today per interventional radiology, will make n.p.o. after midnight, hopefully can be obtained tomorrow.  Diabetes mellitus type 2 with  peripheral neuropathy, CKD on gabapentin, meal coverage insulin, Lantus 25 units, Metformin as an outpatient --One episode of hypoglycemia.  We will continue sliding scale insulin, will decrease Lantus to 10 units at night.  Continue to hold Metformin. --Continue gabapentin.  Hold lisinopril until renal function stabilizes.  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 Doy Mince of neurology.  Can safely hold aspirin and Plavix now that the patient is going on anticoagulation.    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  IV fluids.  Hold lisinopril.  Check BMP in a.m.  Chronic microcytic anemia.  Suspicious for iron deficiency.  No history of bleeding presented. --He is close to baseline.  Check anemia panel.  Suggest outpatient GI evaluation.  COPD, centrilobular emphysema --Appears stable.  Continue bronchodilators  Prolonged QT --Will repeat EKG in a.m.  DVT prophylaxis: heparin infusion Code Status: Full Family Communication: none Disposition Plan: home    Murray Hodgkins, MD  [facility] Direct contact: see [email or web address] (further directions at bottom of note if needed) 7PM-7AM contact night coverage as at bottom of note 01/21/2019, 1:33 PM  LOS: 1 day   Significant Hospital Events   . 11/29 admitted for bilateral PE with right heart strain, new liver lesions concerning for metastatic disease.  Started on heparin. Marland Kitchen 11/30 pulmonology consultation . 12/1 vascular surgery consultation   Consults:  . Pulmonology . Vascular surgery . Interventional radiology   Procedures:  .   Significant Diagnostic Tests:  . 11/29 chest x-ray no acute disease . 11/29 CT angiogram of the chest, CT abdomen and pelvis: Bilateral partially occlusive segmental and subsegmental pulmonary emboli, findings suggestive of early right ventricular heart strain, nodular opacity left upper  lobe, multiple unchanged subcentimeter pulmonary nodules, centrilobular emphysematous changes, numerous hepatic lesions throughout  suggestive of hepatic metastatic disease . EKG sinus rhythm, prolonged QT, septal MI old compared to previous study 05/02/2018, anterolateral T wave inversion new . Echocardiogram LVEF 55-60%, normal LV function.  Global right ventricle moderately reduced systolic function.  Moderately enlarged.   Micro Data:  . SARS-CoV-2  antigen test negative . SARS-CoV-2 PCR pending . 11/29 urine culture . 11/29 blood cultures   Antimicrobials:  .   Interval History/Subjective  Feels okay today, breathing fine, no chest pain.  No leg pain.  Reports after discharge she has not traveled anywhere, did not have any get-togethers for Thanksgiving.  Has not been immobilized.  No previous history of blood clots.  Objective   Vitals:  Vitals:   01/21/19 0801 01/21/19 1138  BP: 127/79 127/77  Pulse: 96 90  Resp: 20 18  Temp: 97.6 F (36.4 C)   SpO2: 96% 100%    Exam:  Constitutional.  Appears calm, comfortable. Respiratory.  Clear to auscultation bilaterally.  No wheezes, rales or rhonchi.  Normal respiratory effort. Cardiovascular.  Regular rate and rhythm.  No murmur, rub or gallop.  No lower extremity edema. Abdomen.  Soft, nontender, nondistended.  No hepatic masses appreciated. Psychiatric.  Grossly normal mood and affect.  Speech fluent and appropriate.   I have personally reviewed the following:   Today's Data  . 1 episode of hypoglycemia, 49 at 730. Marland Kitchen New without significant change, 1.42.  Slightly above baseline. . Hemoglobin slightly lower at 8.3 but close to baseline.  Microcytosis noted.  Remainder CBC unremarkable.   Scheduled Meds: . amLODipine  5 mg Oral Daily  . cholecalciferol  1,000 Units Oral Daily  . ferrous sulfate  325 mg Oral BID WC  . gabapentin  600 mg Oral TID  . insulin aspart  0-9 Units Subcutaneous TID WC  . insulin glargine  10  Units Subcutaneous QHS  . loratadine  10 mg Oral Daily  . metoprolol tartrate  12.5 mg Oral BID  . multivitamin with minerals  1 tablet Oral Daily  . rosuvastatin  20 mg Oral Daily  . umeclidinium-vilanterol  1 puff Inhalation Daily   Continuous Infusions: . sodium chloride 125 mL/hr at 01/21/19 1215  . heparin 950 Units/hr (01/21/19 1057)    Principal Problem:   Multiple subsegmental pulmonary emboli without acute cor pulmonale (HCC) Active Problems:   Diabetic peripheral neuropathy associated with type 2 diabetes mellitus (HCC)   Weight loss, unintentional   Lung nodules   Liver metastases (HCC)   CKD (chronic kidney disease), stage III   Prolonged QT interval   [name]   Leg DVT (deep venous thromboembolism), acute, bilateral ([place])   LOS: 1 day   How to contact the [facility] Attending or Consulting provider 7A - 7P or covering provider during after hours [place], for this patient?  1. Check the care team in [facility] and look for a) attending/consulting [facility] provider listed and b) the [facility] team listed 2. Log into [email or web address] and use [place]'s universal password to access. If you do not have the password, please contact the hospital operator. 3. Locate the [facility] provider you are looking for under [facility] and page to a number that you can be directly reached. 4. If you still have difficulty reaching the provider, please page the [facility] (Director on Call) for the Hospitalists listed on amion for assistance.

## 2019-01-21 NOTE — Assessment & Plan Note (Addendum)
#   68 year old female patient with no prior history of malignancy/or PE-is currently admitted to hospital for acute shortness of breath.  CT chest shows acute PE; multiple liver lesions  #Multiple liver lesions-with subcentimeter lung nodules-suspicious of malignancy/given history of smoking/weight loss.  Primary is unclear based upon imaging at this time.  Liver biopsy on hold because of patient most recently being on Plavix; patient awaiting to have biopsy on 12/07.  Instructions given to the patient-asked patient to report to the medical mall at 8:00 in the morning on 12/07.   #Acute PE-right heart strain status post thrombectomy-on anticoagulation-with Lovenox.  Patient currently being discharged home on oxygen.  Instructions given to the patient does hold Lovenox-night of 12/06-Sunday-in anticipation of liver biopsy on 12/07.   # Microcytic anemia-unclear etiology-worse- anemia of chronic disease [more likely given elevated ferritin >1000] s/p monitor PRBC transfusion hemoglobin 8.8. STABLE.   #Discussed with Dr. Posey Pronto.  Information given to the patient to follow-up with me in the cancer center on at 8:30 on 12/10-to review the biopsy results Of care.

## 2019-01-21 NOTE — ED Notes (Signed)
Unable to give report, secretary could not get ahold of CN and no RN was assigned.

## 2019-01-21 NOTE — OR Nursing (Signed)
Dr Registered notified on IR Evaluation and management order, but will not be able to do liver biopsy today, ED nurse notified.

## 2019-01-21 NOTE — Consult Note (Signed)
Pulmonary Medicine          Date: 01/21/2019,   MRN# [id number] Audrey Chan 12/21/1950     AdmissionWeight: 55.3 kg                 CurrentWeight: 55.3 kg  Referring physician: Dr. Sarajane Jews    CHIEF COMPLAINT:   Abnormal CT chest   SUBJECTIVE    Patient clinically improved, breathing improved currently on 2-4L/min .  S/p Evaluation by Vasc Surg and Onc.   PAST MEDICAL HISTORY   Past Medical History:  Diagnosis Date   Allergic rhinitis    Aneurysm of unspecified site [facility])    Cholelithiasis    Diabetes type 2, controlled ([place])    GERD (gastroesophageal reflux disease)    Hypertension    Leg DVT (deep venous thromboembolism), acute, bilateral ([place]) 01/21/2019   Multiple subsegmental pulmonary emboli without acute cor pulmonale ([place]) 01/20/2019   Neuropathy    Veno-occlusive disease      SURGICAL HISTORY   Past Surgical History:  Procedure Laterality Date   ABDOMINAL HYSTERECTOMY  1972   CERVICAL FUSION     CHOLECYSTECTOMY N/A 12/28/2014   Procedure: LAPAROSCOPIC CHOLECYSTECTOMY;  Surgeon: Sherri Rad, MD;  Location: [facility] ORS;  Service: General;  Laterality: N/A;   EYE SURGERY     detached retina   MASS EXCISION Left 05/06/2018   Procedure: EXCISION OF LEFT ANTECUBITAL CYST, DIABETIC;  Surgeon: Vickie Epley, MD;  Location: [facility] ORS;  Service: Vascular;  Laterality: Left;     FAMILY HISTORY   Family History  Problem Relation Age of Onset   Lung cancer Father      SOCIAL HISTORY   Social History   Tobacco Use   Smoking status: Former Smoker    Quit date: 02/21/1999    Years since quitting: 19.9   Smokeless tobacco: Never Used  Substance Use Topics   Alcohol use: No    Alcohol/week: 0.0 standard drinks   Drug use: No     MEDICATIONS    Home Medication:    Current Medication:  Current Facility-Administered Medications:    0.9 %  sodium chloride infusion, , Intravenous, Continuous, Stegmayer,  Kimberly A, PA-C, Last Rate: 125 mL/hr at 01/21/19 1215   acetaminophen (TYLENOL) tablet 650 mg, 650 mg, Oral, Q6H PRN **OR** acetaminophen (TYLENOL) suppository 650 mg, 650 mg, Rectal, Q6H PRN, Clance Boll, MD   albuterol (VENTOLIN HFA) 108 (90 Base) MCG/ACT inhaler 2 puff, 2 puff, Inhalation, Q6H PRN, Myles Rosenthal A, MD   amLODipine (NORVASC) tablet 5 mg, 5 mg, Oral, Daily, Clance Boll, MD, Stopped at 01/21/19 [id number]   Chlorhexidine Gluconate Cloth 2 % PADS 6 each, 6 each, Topical, Daily, Dew, Erskine Squibb, MD, 6 each at 01/21/19 1743   cholecalciferol (VITAMIN D) tablet 1,000 Units, 1,000 Units, Oral, Daily, Clance Boll, MD, 1,000 Units at 01/21/19 0914   ferrous sulfate tablet 325 mg, 325 mg, Oral, BID WC, Myles Rosenthal A, MD, 325 mg at 01/21/19 0802   fluticasone (FLONASE) 50 MCG/ACT nasal spray 2 spray, 2 spray, Each Nare, Daily PRN, Clance Boll, MD   gabapentin (NEURONTIN) capsule 600 mg, 600 mg, Oral, TID, Samuella Cota, MD   [COMPLETED] heparin bolus via infusion 4,000 Units, 4,000 Units, Intravenous, Once, 4,000 Units at 01/20/19 0340 **FOLLOWED BY** heparin ADULT infusion 100 units/mL (25000 units/29mL sodium chloride 0.45%), 950 Units/hr, Intravenous, Continuous, Hall, Scott A, RPH, Last Rate: 9.5 mL/hr at 01/21/19 1057, 950  Units/hr at 01/21/19 1057   insulin aspart (novoLOG) injection 0-9 Units, 0-9 Units, Subcutaneous, TID WC, Clance Boll, MD, Stopped at 01/21/19 0802   insulin glargine (LANTUS) injection 10 Units, 10 Units, Subcutaneous, QHS, Samuella Cota, MD   loratadine (CLARITIN) tablet 10 mg, 10 mg, Oral, Daily, Myles Rosenthal A, MD, 10 mg at 01/21/19 0913   metoprolol tartrate (LOPRESSOR) tablet 12.5 mg, 12.5 mg, Oral, BID, Clance Boll, MD, Stopped at 01/21/19 [id number]   multivitamin with minerals tablet 1 tablet, 1 tablet, Oral, Daily, Myles Rosenthal A, MD, 1 tablet at 01/21/19 0914   polyethylene  glycol (MIRALAX / GLYCOLAX) packet 17 g, 1 packet, Oral, Daily PRN, Clance Boll, MD   rosuvastatin (CRESTOR) tablet 20 mg, 20 mg, Oral, Daily, Myles Rosenthal A, MD, 20 mg at 01/21/19 0913   umeclidinium-vilanterol (ANORO ELLIPTA) 62.5-25 MCG/INH 1 puff, 1 puff, Inhalation, Daily, Clance Boll, MD, 1 puff at 01/21/19 0804    ALLERGIES   Patient has no known allergies.     REVIEW OF SYSTEMS    Review of Systems:  Gen:  Denies  fever, sweats, chills weigh loss  HEENT: Denies blurred vision, double vision, ear pain, eye pain, hearing loss, nose bleeds, sore throat Cardiac:  No dizziness, chest pain or heaviness, chest tightness,edema Resp:   Denies cough or sputum porduction, shortness of breath,wheezing, hemoptysis,  Gi: Denies swallowing difficulty, stomach pain, nausea or vomiting, diarrhea, constipation, bowel incontinence Gu:  Denies bladder incontinence, burning urine Ext:   Denies Joint pain, stiffness or swelling Skin: Denies  skin rash, easy bruising or bleeding or hives Endoc:  Denies polyuria, polydipsia , polyphagia or weight change Psych:   Denies depression, insomnia or hallucinations   Other:  All other systems negative   VS: BP 131/64 (BP Location: Right Arm)    Pulse (!) 107    Temp 97.6 F (36.4 C) (Oral)    Resp 20    Ht 5\' 6"  (1.676 m)    Wt 55.3 kg    SpO2 100%    BMI 19.69 kg/m      PHYSICAL EXAM    GENERAL:NAD, no fevers, chills, no weakness no fatigue HEAD: Normocephalic, atraumatic.  EYES: Pupils equal, round, reactive to light. Extraocular muscles intact. No scleral icterus.  MOUTH: Moist mucosal membrane. Dentition intact. No abscess noted.  EAR, NOSE, THROAT: Clear without exudates. No external lesions.  NECK: Supple. No thyromegaly. No nodules. No JVD.  PULMONARY: Diffuse coarse rhonchi right sided +wheezes CARDIOVASCULAR: S1 and S2. Regular rate and rhythm. No murmurs, rubs, or gallops. No edema. Pedal pulses 2+  bilaterally.  GASTROINTESTINAL: Soft, nontender, nondistended. No masses. Positive bowel sounds. No hepatosplenomegaly.  MUSCULOSKELETAL: No swelling, clubbing, or edema. Range of motion full in all extremities.  NEUROLOGIC: Cranial nerves II through XII are intact. No gross focal neurological deficits. Sensation intact. Reflexes intact.  SKIN: No ulceration, lesions, rashes, or cyanosis. Skin warm and dry. Turgor intact.  PSYCHIATRIC: Mood, affect within normal limits. The patient is awake, alert and oriented x 3. Insight, judgment intact.       IMAGING    Ct Head Wo Contrast  Result Date: 12/31/2018 CLINICAL DATA:  Seizure EXAM: CT HEAD WITHOUT CONTRAST TECHNIQUE: Contiguous axial images were obtained from the base of the skull through the vertex without intravenous contrast. COMPARISON:  None. FINDINGS: Brain: There is no acute intracranial hemorrhage, mass-effect, or edema. There is no new loss of gray-white differentiation. Confluent areas of hypoattenuation in the  supratentorial white matter are nonspecific but likely reflect stable advanced microvascular ischemic changes. Chronic small infarcts of the left cerebellum and thalamus again noted. There is no extra-axial collection. Ventricles are stable in size. Vascular: No hyperdense vessel. Skull: Calvarium is unremarkable. Sinuses/Orbits: Mild paranasal sinus mucosal thickening. Orbits are unremarkable. Other: Mastoid air cells are clear. IMPRESSION: No acute or new findings since December 29, 2018. Stable chronic findings detailed above. Electronically Signed   By: Macy Mis M.D.   On: 12/31/2018 13:15   Ct Head Wo Contrast  Result Date: 12/29/2018 CLINICAL DATA:  Loss of consciousness EXAM: CT HEAD WITHOUT CONTRAST TECHNIQUE: Contiguous axial images were obtained from the base of the skull through the vertex without intravenous contrast. COMPARISON:  None. FINDINGS: Brain: No evidence of acute territorial infarction, hemorrhage,  hydrocephalus,extra-axial collection or mass lesion/mass effect. The ventricles are normal in size and contour. Low-attenuation changes in the deep white matter consistent with small vessel ischemia. Vascular: No hyperdense vessel or unexpected calcification. Skull: The skull is intact. No fracture or focal lesion identified. Sinuses/Orbits: The visualized paranasal sinuses and mastoid air cells are clear. The orbits and globes intact. Other: None IMPRESSION: No acute intracranial abnormality. Findings consistent with chronic small vessel ischemia Electronically Signed   By: Prudencio Pair M.D.   On: 12/29/2018 23:08   Ct Angio Chest Pe W/cm &/or Wo Cm  Result Date: 01/20/2019 CLINICAL DATA:  Suspected covid, elevated LFTs, recent stroke EXAM: CT ANGIOGRAPHY CHEST, abdomen, and pelvis WITH CONTRAST TECHNIQUE: Multidetector CT imaging of the chest, abdomen, and pelvis was performed using the standard protocol during bolus administration of intravenous contrast. Multiplanar CT image reconstructions and MIPs were obtained to evaluate the vascular anatomy. CONTRAST:  10mL OMNIPAQUE IOHEXOL 350 MG/ML SOLN COMPARISON:  CT chest September 13, 2017 FINDINGS: Cardiovascular: There is a optimal opacification of the pulmonary arteries. There is a small nonocclusive thrombus seen within the left main pulmonary artery which extends into the anterior left upper lobe segmental branches and partially occlusive thrombus in the posterior left lower lobe subsegmental branches. There is also partially occlusive thrombus seen within the posterior right upper lobe segmental branch, series 6, image 37. There is also partially occlusive thrombus seen extending into the right middle lobe subsegmental branches and posterior right lower lobe subsegmental branches. The heart is normal in size. No pericardial effusion or thickening. There is slight straightening of the interventricular septum which could be due to early right ventricular heart  strain. There is normal three-vessel brachiocephalic anatomy without proximal stenosis. Scattered atherosclerosis is noted. The thoracic aorta is normal in appearance. Mediastinum/Nodes: No hilar, mediastinal, or axillary adenopathy. Thyroid gland, trachea, and esophagus demonstrate no significant findings. Lungs/Pleura: Small subcentimeter pulmonary nodule are again identified as on prior exam dating back to 2019, for example in the posterior right upper lobe there is a 5 mm pulmonary nodule, series 8, image 21 and anterior right lower lobe, series 8, image 54 a 4 mm pulmonary nodule. There is however a new small nodular opacity in the posterior left upper lobe, series 8, image 47 measuring 7 mm not seen on prior exam. No large airspace consolidation or pleural effusion is seen. Centrilobular emphysematous changes seen within both lung apices. Musculoskeletal: No chest wall abnormality. No acute or significant osseous findings. Review of the MIP images confirms the above findings. Abdomen/pelvis: Hepatobiliary: There are multiple low-density liver lesions seen throughout which were not seen on the prior exam. There is also heterogeneous enhancement seen throughout the liver  parenchyma. Central periportal edema is noted. The portal vein is patent. The patient is status post cholecystectomy. A small amount of perihepatic ascites is noted. Pancreas: Unremarkable. No pancreatic ductal dilatation or surrounding inflammatory changes. Spleen: Normal in size without focal abnormality. Adrenals/Urinary Tract: Both adrenal glands appear normal. The kidneys and collecting system appear normal without evidence of urinary tract calculus or hydronephrosis. Bladder is unremarkable. Stomach/Bowel: The stomach, small bowel, and colon are normal in appearance. No inflammatory changes, wall thickening, or obstructive findings. Vascular/Lymphatic: There are no enlarged mesenteric, retroperitoneal, or pelvic lymph nodes. Scattered  aortic atherosclerotic calcifications are seen without aneurysmal dilatation. Reproductive: The uterus and adnexa are unremarkable. Other: No evidence of abdominal wall mass or hernia. Musculoskeletal: No acute or significant osseous findings. IMPRESSION: 1. Bilateral partially occlusive segmental and subsegmental pulmonary emboli as described above. 2. Findings which could be suggestive of early right ventricular heart strain. 3. Multiple unchanged subcentimeter pulmonary nodules as on prior exam dating back to 2019. 4. New nodular opacity within the posterior left upper lobe measuring 7 mm which is nonspecific, and would recommend attention on short interval follow-up exam. 5. Centrilobular emphysematous changes. 6. Numerous hepatic lesions throughout, likely suggestive of hepatic metastatic disease. 7. Small amount of perihepatic ascites 8. These results were called by telephone at the time of interpretation on 01/20/2019 at 2:07 am to provider [facility] , who verbally acknowledged these results. Electronically Signed   By: Prudencio Pair M.D.   On: 01/20/2019 02:09   Mr Jeri Cos [id number] Contrast  Result Date: 01/01/2019 CLINICAL DATA:  Encephalopathy.  DKA. EXAM: MRI HEAD WITHOUT AND WITH CONTRAST TECHNIQUE: Multiplanar, multiecho pulse sequences of the brain and surrounding structures were obtained without and with intravenous contrast. CONTRAST:  30mL GADAVIST GADOBUTROL 1 MMOL/ML IV SOLN COMPARISON:  CT head 12/31/2018 FINDINGS: Brain: Small acute infarct left inferior cerebellum. No other acute infarct Chronic infarct left inferior cerebellum. Extensive white matter disease bilaterally. Diffuse subcortical and deep white matter hyperintensity throughout both cerebral hemispheres. Hyperintensity is also noted in the right thalamus. Negative for hemorrhage or mass. Ventricle size normal. Normal enhancement postcontrast administration Vascular: Normal arterial flow voids Skull and upper cervical spine: No focal  bony abnormality. Cervical spine fusion hardware. Sinuses/Orbits: Negative Other: None IMPRESSION: 1. Acute small infarct left medial cerebellum. 2. Extensive disease throughout the white matter most compatible with chronic ischemia. Chronic infarct left cerebellum. Electronically Signed   By: Franchot Gallo M.D.   On: 01/01/2019 15:05   Ct Abd/pelvis W/ Iv Contrast  Result Date: 01/20/2019 CLINICAL DATA:  Suspected covid, elevated LFTs, recent stroke EXAM: CT ANGIOGRAPHY CHEST, abdomen, and pelvis WITH CONTRAST TECHNIQUE: Multidetector CT imaging of the chest, abdomen, and pelvis was performed using the standard protocol during bolus administration of intravenous contrast. Multiplanar CT image reconstructions and MIPs were obtained to evaluate the vascular anatomy. CONTRAST:  6mL OMNIPAQUE IOHEXOL 350 MG/ML SOLN COMPARISON:  CT chest September 13, 2017 FINDINGS: Cardiovascular: There is a optimal opacification of the pulmonary arteries. There is a small nonocclusive thrombus seen within the left main pulmonary artery which extends into the anterior left upper lobe segmental branches and partially occlusive thrombus in the posterior left lower lobe subsegmental branches. There is also partially occlusive thrombus seen within the posterior right upper lobe segmental branch, series 6, image 37. There is also partially occlusive thrombus seen extending into the right middle lobe subsegmental branches and posterior right lower lobe subsegmental branches. The heart is normal in size. No pericardial  effusion or thickening. There is slight straightening of the interventricular septum which could be due to early right ventricular heart strain. There is normal three-vessel brachiocephalic anatomy without proximal stenosis. Scattered atherosclerosis is noted. The thoracic aorta is normal in appearance. Mediastinum/Nodes: No hilar, mediastinal, or axillary adenopathy. Thyroid gland, trachea, and esophagus demonstrate no  significant findings. Lungs/Pleura: Small subcentimeter pulmonary nodule are again identified as on prior exam dating back to 2019, for example in the posterior right upper lobe there is a 5 mm pulmonary nodule, series 8, image 21 and anterior right lower lobe, series 8, image 54 a 4 mm pulmonary nodule. There is however a new small nodular opacity in the posterior left upper lobe, series 8, image 47 measuring 7 mm not seen on prior exam. No large airspace consolidation or pleural effusion is seen. Centrilobular emphysematous changes seen within both lung apices. Musculoskeletal: No chest wall abnormality. No acute or significant osseous findings. Review of the MIP images confirms the above findings. Abdomen/pelvis: Hepatobiliary: There are multiple low-density liver lesions seen throughout which were not seen on the prior exam. There is also heterogeneous enhancement seen throughout the liver parenchyma. Central periportal edema is noted. The portal vein is patent. The patient is status post cholecystectomy. A small amount of perihepatic ascites is noted. Pancreas: Unremarkable. No pancreatic ductal dilatation or surrounding inflammatory changes. Spleen: Normal in size without focal abnormality. Adrenals/Urinary Tract: Both adrenal glands appear normal. The kidneys and collecting system appear normal without evidence of urinary tract calculus or hydronephrosis. Bladder is unremarkable. Stomach/Bowel: The stomach, small bowel, and colon are normal in appearance. No inflammatory changes, wall thickening, or obstructive findings. Vascular/Lymphatic: There are no enlarged mesenteric, retroperitoneal, or pelvic lymph nodes. Scattered aortic atherosclerotic calcifications are seen without aneurysmal dilatation. Reproductive: The uterus and adnexa are unremarkable. Other: No evidence of abdominal wall mass or hernia. Musculoskeletal: No acute or significant osseous findings. IMPRESSION: 1. Bilateral partially occlusive  segmental and subsegmental pulmonary emboli as described above. 2. Findings which could be suggestive of early right ventricular heart strain. 3. Multiple unchanged subcentimeter pulmonary nodules as on prior exam dating back to 2019. 4. New nodular opacity within the posterior left upper lobe measuring 7 mm which is nonspecific, and would recommend attention on short interval follow-up exam. 5. Centrilobular emphysematous changes. 6. Numerous hepatic lesions throughout, likely suggestive of hepatic metastatic disease. 7. Small amount of perihepatic ascites 8. These results were called by telephone at the time of interpretation on 01/20/2019 at 2:07 am to provider [facility] , who verbally acknowledged these results. Electronically Signed   By: Prudencio Pair M.D.   On: 01/20/2019 02:09   US Venous Img Lower Bilateral (dvt)  Result Date: 01/20/2019 CLINICAL DATA:  History of pulmonary embolism, now with lower extremity pain. Evaluate for DVT. EXAM: BILATERAL LOWER EXTREMITY VENOUS DOPPLER ULTRASOUND TECHNIQUE: Gray-scale sonography with graded compression, as well as color Doppler and duplex ultrasound were performed to evaluate the lower extremity deep venous systems from the level of the common femoral vein and including the common femoral, femoral, profunda femoral, popliteal and calf veins including the posterior tibial, peroneal and gastrocnemius veins when visible. The superficial great saphenous vein was also interrogated. Spectral Doppler was utilized to evaluate flow at rest and with distal augmentation maneuvers in the common femoral, femoral and popliteal veins. COMPARISON:  Chest CTA-01/20/2019 FINDINGS: RIGHT LOWER EXTREMITY Common Femoral Vein: No evidence of thrombus. Normal compressibility, respiratory phasicity and response to augmentation. Saphenofemoral Junction: No evidence of thrombus.  Normal compressibility and flow on color Doppler imaging. Profunda Femoral Vein: No evidence of thrombus.  Normal compressibility and flow on color Doppler imaging. Femoral Vein: No evidence of thrombus. Normal compressibility, respiratory phasicity and response to augmentation. Popliteal Vein: No evidence of thrombus. Normal compressibility, respiratory phasicity and response to augmentation. Calf Veins: There is hypoechoic occlusive thrombus within both paired right peroneal veins (images 58 and 59). Both paired right posterior tibial veins appear patent where imaged. Superficial Great Saphenous Vein: No evidence of thrombus. Normal compressibility. Other Findings:  None. _________________________________________________________ LEFT LOWER EXTREMITY Common Femoral Vein: No evidence of thrombus. Normal compressibility, respiratory phasicity and response to augmentation. Saphenofemoral Junction: No evidence of thrombus. Normal compressibility and flow on color Doppler imaging. Profunda Femoral Vein: No evidence of thrombus. Normal compressibility and flow on color Doppler imaging. Femoral Vein: No evidence of thrombus. Normal compressibility, respiratory phasicity and response to augmentation. Popliteal Vein: No evidence of thrombus. Normal compressibility, respiratory phasicity and response to augmentation. Calf Veins: There is hypoechoic occlusive thrombus within 1 of the paired left peroneal veins (images 29 and 30). The adjacent paired left peroneal vein as well though both paired posterior tibial veins appear patent. Superficial Great Saphenous Vein: No evidence of thrombus. Normal compressibility. Other Findings:  None. IMPRESSION: Examination is positive for occlusive DVT involving both paired right peroneal veins as well as one of two paired left peroneal veins. There is no extension of this distal tibial DVT to the more proximal venous system of either lower extremity. Electronically Signed   By: [name] Mariscal M.D.   On: 01/20/2019 17:02   Dg Chest Portable 1 View  Result Date: 01/19/2019 CLINICAL DATA:   Shortness of breath EXAM: PORTABLE CHEST 1 VIEW COMPARISON:  12/31/2018 FINDINGS: Cardiomegaly. Lungs clear. No effusions or edema. No acute bony abnormality. IMPRESSION: Cardiomegaly.  No active disease. Electronically Signed   By: Rolm Baptise M.D.   On: 01/19/2019 23:33   Dg Chest Port 1 View  Result Date: 12/31/2018 CLINICAL DATA:  Sepsis EXAM: PORTABLE CHEST 1 VIEW COMPARISON:  December 29, 2018 which is FINDINGS: The heart size and mediastinal contours are unchanged. Again noted is prominence to the central pulmonary vasculature. There is mildly increased interstitial markings seen at both lower lungs slightly more pronounced than the prior exam. No pleural effusion. No acute osseous abnormality. IMPRESSION: Pulmonary vascular congestion and mildly increased interstitial markings at both lower lungs, slightly more pronounced than the prior exam. This could be due to interstitial edema and/or early infectious etiology. Electronically Signed   By: Prudencio Pair M.D.   On: 12/31/2018 03:38   Dg Chest Port 1 View  Result Date: 12/29/2018 CLINICAL DATA:  Altered mental status EXAM: PORTABLE CHEST 1 VIEW COMPARISON:  November 12, 2016 FINDINGS: The heart size and mediastinal contours are within normal limits. There is prominence to the central pulmonary vasculature. No large airspace consolidation or pleural effusion. IMPRESSION: Findings suggestive pulmonary vascular congestion. Electronically Signed   By: Prudencio Pair M.D.   On: 12/29/2018 21:16     IMPRESSION: 1. Bilateral partially occlusive segmental and subsegmental pulmonary emboli as described above. 2. Findings which could be suggestive of early right ventricular heart strain. 3. Multiple unchanged subcentimeter pulmonary nodules as on prior exam dating back to 2019. 4. New nodular opacity within the posterior left upper lobe measuring 7 mm which is nonspecific, and would recommend attention on short interval follow-up exam. 5.  Centrilobular emphysematous changes. 6. Numerous hepatic lesions throughout, likely suggestive  of hepatic metastatic disease. 7. Small amount of perihepatic ascites 8. These results were called by telephone at the time of interpretation on 01/20/2019 at 2:07 am to provider [facility] , who verbally acknowledged these results.   Electronically Signed   By: Prudencio Pair M.D.   On: 01/20/2019 02:09    ASSESSMENT/PLAN   Acute hypoxemic respiratory failure secondary to non-massive bilateral partially occlusive pulmonary embolism  -Patient with stable blood pressure on 4 L nasal cannula saturating 90 to 95% -High-sensitivity troponin is mildly elevated at 2019 -Slight straightening of the interventricular septum of the heart indicative of early RV strain per CT chest  --will obtain BNP -CT abdomen with suggestion of liver metastasis -Please hold any oral anticoagulants and lovenox until procedures are done -s/p evaluation by oncology - plan for IR performed liver biopsy.  -Transthoracic echo with reduced RV systolic function as well as dilated right ventricular size indicative of RV strain -Tricuspid regurgitant velocity 3.62 m/s indicative of pulmonary hypertension -Vascular surgery evaluation - plan for thrombectomy   Chronic COPD  -Continue DuoNeb every 6 hours while awake  -Chest physiotherapy with MetaNeb to combat atelectatic segments -Please encourage patient to use incentive spirometer at the bedside at least every hour few times No clinical signs or symptoms of acute exacerbation of COPD    Thank you Dr. Sarajane Jews for allowing me to participate in the care of this patient.   Patient/Family are satisfied with care plan and all questions have been answered.  This document was prepared using Dragon voice recognition software and may include unintentional dictation errors.     Ottie Glazier, M.D.  Division of [place]

## 2019-01-21 NOTE — Progress Notes (Signed)
ANTICOAGULATION CONSULT NOTE - Initial Consult  Pharmacy Consult for Heparin Indication: pulmonary embolus  No Known Allergies  Patient Measurements: Height: 5\' 6"  (167.6 cm) Weight: 122 lb (55.3 kg) IBW/kg (Calculated) : 59.3 HEPARIN DW (KG): 55.3  Vital Signs: Temp: 97.6 F (36.4 C) (12/01 0801) Temp Source: Oral (12/01 0801) BP: 127/79 (12/01 0801) Pulse Rate: 96 (12/01 0801)  Labs: Recent Labs    01/19/19 2325 01/20/19 0709 01/20/19 0905 01/20/19 1120 01/20/19 1616 01/21/19 0717  HGB 9.9*  --   --   --   --  8.3*  HCT 31.0*  --   --   --   --  25.3*  PLT 355  --   --   --   --  351  APTT  --   --  119*  --   --   --   LABPROT 15.8*  --   --   --   --   --   INR 1.3*  --   --   --   --   --   HEPARINUNFRC  --   --  0.60  --  0.48 0.37  CREATININE 1.58*  --   --  1.33*  --  1.42*  TROPONINIHS  --  2,101*  --  2,019*  --   --    Estimated Creatinine Clearance: 32.6 mL/min (A) (by C-G formula based on SCr of 1.42 mg/dL (H)).  Medical History: Past Medical History:  Diagnosis Date  . Allergic rhinitis   . Aneurysm of unspecified site ([place])   . Cholelithiasis   . Diabetes type 2, controlled ([place])   . GERD (gastroesophageal reflux disease)   . Hypertension   . Neuropathy   . Veno-occlusive disease    Medications:  (Not in a hospital admission)  Assessment: Pharmacy asked to initiate and monitor Heparin for PE.  Baseline labs ordered.  Per PTA med list pt not on any anticoagulants.  11/30 0230 Heparin bolus 4000 units x 1 then infusion at 950 units/hr 11/30 0905 HL 0.60 therapeutic  11/30 1616 HL 0.48 therapeutic 12/01 0717 HL 0.37 Therapeutic  Goal of Therapy:  Heparin level 0.3-0.7 units/ml Monitor platelets by anticoagulation protocol: Yes   Plan:  Heparin level is therapeutic. Will continue heparin drip @ 950 units/hr. Order heparin and CBC with AM labs. Pharmacy will continue to monitor and follow.   Lu Duffel, PharmD, BCPS Clinical  Pharmacist 01/21/2019 8:23 AM

## 2019-01-21 NOTE — ED Notes (Addendum)
Pt given 8oz juice and peanut butter crackers due to CBG of 49. Will recheck blood sugar after intake. Pt alert and oriented X4.

## 2019-01-21 NOTE — ED Notes (Signed)
Will keep patient NPO as pt has possible procedure today.

## 2019-01-21 NOTE — Consult Note (Signed)
[facility] Consult Note  MRN : [id number]  Audrey Chan is a 68 y.o. (1950/11/02) female who presents with chief complaint of  Chief Complaint  Patient presents with  . Shortness of Breath  . Dizziness   History of Present Illness:  The patient is a 68 year old female with multiple medical issues (see below) who presented to the [facility] emergency department via EMS with a chief complaint of "dizziness" and "shortness of breath".  Patient endorses a history of progressively worsening dizziness and shortness of breath for approximately the last 5 to 6 days.  Both the dizziness and the shortness of breath worsen with ambulation.  Denies any cough.  Denies any chest pain.  Denies any fever, nausea or vomiting.  Patient describes her symptoms as severe and this is what prompted her to seek medical attention.  Since initiation of heparin the patient's shortness of breath has improved slightly however is still significant.  She denies any bilateral lower extremity pain or swelling.  Denies any past medical history of DVT or PE.  Of note, patient with a recent admission about 2 weeks ago for metabolic encephalopathy secondary to both diabetes/DKA, sepsis and acute renal failure due to UTI.  During that admission, the patient was found to have a small cerebral stroke.  Patient was already on dual antiplatelet therapy and no intervention was necessary.  She denies any residual effects.  (01/20/19) Bilateral Venous Duplex:  Examination is positive for occlusive DVT involving both paired right peroneal veins as well as one of two paired left peroneal veins. There is no extension of this distal tibial DVT to the more proximal venous system of either lower extremity.  (01/10/19) CTA Chest: 1. Bilateral partially occlusive segmental and subsegmental pulmonary emboli as described above. 2. Findings which could be suggestive of early right  ventricular heart strain. 3. Multiple unchanged subcentimeter pulmonary nodules as on prior exam dating back to 2019. 4. New nodular opacity within the posterior left upper lobe measuring 7 mm which is nonspecific, and would recommend attention on short interval follow-up exam. 5. Centrilobular emphysematous changes. 6. Numerous hepatic lesions throughout, likely suggestive of hepatic metastatic disease. 7. Small amount of perihepatic ascites  Vascular surgery was consulted by Dr. Lanney Gins for further recommendations.  Current Facility-Administered Medications  Medication Dose Route Frequency Provider Last Rate Last Dose  . 0.9 %  sodium chloride infusion   Intravenous Continuous Elisabeth Strom A, PA-C 125 mL/hr at 01/21/19 1215    . acetaminophen (TYLENOL) tablet 650 mg  650 mg Oral Q6H PRN Clance Boll, MD       Or  . acetaminophen (TYLENOL) suppository 650 mg  650 mg Rectal Q6H PRN Myles Rosenthal A, MD      . albuterol (VENTOLIN HFA) 108 (90 Base) MCG/ACT inhaler 2 puff  2 puff Inhalation Q6H PRN Myles Rosenthal A, MD      . amLODipine (NORVASC) tablet 5 mg  5 mg Oral Daily Clance Boll, MD   Stopped at 01/21/19 [phone]  . cholecalciferol (VITAMIN D) tablet 1,000 Units  1,000 Units Oral Daily Clance Boll, MD   1,000 Units at 01/21/19 0914  . ferrous sulfate tablet 325 mg  325 mg Oral BID WC Myles Rosenthal A, MD   325 mg at 01/21/19 0802  . fluticasone (FLONASE) 50 MCG/ACT nasal spray 2 spray  2 spray Each Nare Daily PRN Clance Boll, MD      . gabapentin (  NEURONTIN) capsule 600 mg  600 mg Oral TID Samuella Cota, MD      . heparin ADULT infusion 100 units/mL (25000 units/282mL sodium chloride 0.45%)  950 Units/hr Intravenous Continuous Hall, Scott A, RPH 9.5 mL/hr at 01/21/19 1057 950 Units/hr at 01/21/19 1057  . insulin aspart (novoLOG) injection 0-9 Units  0-9 Units Subcutaneous TID WC Clance Boll, MD   Stopped at 01/21/19 0802  .  insulin glargine (LANTUS) injection 12 Units  12 Units Subcutaneous QHS Samuella Cota, MD      . lisinopril (ZESTRIL) tablet 20 mg  20 mg Oral Daily Clance Boll, MD   Stopped at 01/21/19 [phone]  . loratadine (CLARITIN) tablet 10 mg  10 mg Oral Daily Myles Rosenthal A, MD   10 mg at 01/21/19 0913  . metoprolol tartrate (LOPRESSOR) tablet 12.5 mg  12.5 mg Oral BID Clance Boll, MD   Stopped at 01/21/19 [phone]  . multivitamin with minerals tablet 1 tablet  1 tablet Oral Daily Clance Boll, MD   1 tablet at 01/21/19 0914  . polyethylene glycol (MIRALAX / GLYCOLAX) packet 17 g  1 packet Oral Daily PRN Myles Rosenthal A, MD      . rosuvastatin (CRESTOR) tablet 20 mg  20 mg Oral Daily Myles Rosenthal A, MD   20 mg at 01/21/19 0913  . umeclidinium-vilanterol (ANORO ELLIPTA) 62.5-25 MCG/INH 1 puff  1 puff Inhalation Daily Clance Boll, MD   1 puff at 01/21/19 [id number]   Current Outpatient Medications  Medication Sig Dispense Refill  . acetaminophen (TYLENOL) 500 MG tablet Take 500-1,000 mg by mouth every 6 (six) hours as needed for moderate pain.     Marland Kitchen albuterol (PROVENTIL HFA;VENTOLIN HFA) 108 (90 BASE) MCG/ACT inhaler Inhale 2 puffs into the lungs every 6 (six) hours as needed for wheezing or shortness of breath.     Marland Kitchen amLODipine (NORVASC) 5 MG tablet Take 1 tablet (5 mg total) by mouth daily. 30 tablet 0  . aspirin EC 81 MG tablet Take 81 mg by mouth daily.    . cetirizine (ZYRTEC) 10 MG tablet Take 10 mg by mouth daily.     . Cholecalciferol (VITAMIN D3) 1000 units CAPS Take 1,000 Units by mouth daily.     . clopidogrel (PLAVIX) 75 MG tablet Take 75 mg by mouth daily.    . ferrous sulfate 325 (65 FE) MG tablet Take 325 mg by mouth 2 (two) times daily with a meal.     . fluticasone (FLONASE) 50 MCG/ACT nasal spray Place 2 sprays into both nostrils daily as needed for allergies or rhinitis.     Marland Kitchen gabapentin (NEURONTIN) 800 MG tablet Take 800 mg by mouth 3 (three) times  daily.     . hydrochlorothiazide (HYDRODIURIL) 25 MG tablet Take 25 mg by mouth daily.    . insulin aspart (NOVOLOG) 100 UNIT/ML injection Check blood sugar prior to meals and give insulin on the following scale: Blood sugar less than 150: 0 units BS 150-200: 2 units BS 200-250: 3 units BS 250-300: 4 units BS 300-350: 5 units BS 350-400: 6 units BS above 400: 8 units and call your doctor. 10 mL 1  . LANTUS SOLOSTAR 100 UNIT/ML Solostar Pen Inject 25 Units into the skin at bedtime. 15 mL 0  . lisinopril (ZESTRIL) 10 MG tablet Take 2 tablets (20 mg total) by mouth daily. 30 tablet 0  . metFORMIN (GLUCOPHAGE-XR) 500 MG 24 hr tablet Take 1,000 mg by  mouth daily.     . metoprolol tartrate (LOPRESSOR) 25 MG tablet Take 12.5 mg by mouth 2 (two) times daily.    . Multiple Vitamin (MULTI-VITAMINS) TABS Take 1 tablet by mouth daily.    . polyethylene glycol powder (GLYCOLAX/MIRALAX) powder Take 0.5 Containers by mouth daily as needed for moderate constipation.     Marland Kitchen umeclidinium-vilanterol (ANORO ELLIPTA) 62.5-25 MCG/INH AEPB Inhale 1 puff into the lungs daily.    . APIDRA SOLOSTAR 100 UNIT/ML Solostar Pen Inject 0-50 Units into the skin.    Marland Kitchen diclofenac (FLECTOR) 1.3 % PTCH Apply 1 patch topically 2 (two) times daily. Apply 1 patch twice a day.    . mometasone (ELOCON) 0.1 % cream Apply 1 application topically 2 (two) times daily as needed (rash).     . rosuvastatin (CRESTOR) 20 MG tablet Take 20 mg by mouth daily.      Past Medical History:  Diagnosis Date  . Allergic rhinitis   . Aneurysm of unspecified site ([place])   . Cholelithiasis   . Diabetes type 2, controlled ([place])   . GERD (gastroesophageal reflux disease)   . Hypertension   . Neuropathy   . Veno-occlusive disease    Past Surgical History:  Procedure Laterality Date  . ABDOMINAL HYSTERECTOMY  1972  . CERVICAL FUSION    . CHOLECYSTECTOMY N/A 12/28/2014   Procedure: LAPAROSCOPIC CHOLECYSTECTOMY;  Surgeon: Sherri Rad, MD;  Location:  [facility] ORS;  Service: General;  Laterality: N/A;  . EYE SURGERY     detached retina  . MASS EXCISION Left 05/06/2018   Procedure: EXCISION OF LEFT ANTECUBITAL CYST, DIABETIC;  Surgeon: Vickie Epley, MD;  Location: [facility] ORS;  Service: Vascular;  Laterality: Left;   Social History Social History   Tobacco Use  . Smoking status: Former Smoker    Quit date: 02/21/1999    Years since quitting: 19.9  . Smokeless tobacco: Never Used  Substance Use Topics  . Alcohol use: No    Alcohol/week: 0.0 standard drinks  . Drug use: No   Family History Family History  Problem Relation Age of Onset  . Lung cancer Father   Denies family history of peripheral artery disease, renal disease or bleeding/clotting disorders.  No Known Allergies  REVIEW OF SYSTEMS (Negative unless checked)  Constitutional: [] Weight loss  [] Fever  [] Chills Cardiac: [] Chest pain   [] Chest pressure   [] Palpitations   [x] Shortness of breath when laying flat   [x] Shortness of breath at rest   [x] Shortness of breath with exertion. Vascular:  [] Pain in legs with walking   [] Pain in legs at rest   [] Pain in legs when laying flat   [] Claudication   [] Pain in feet when walking  [] Pain in feet at rest  [] Pain in feet when laying flat   [] History of DVT   [] Phlebitis   [] Swelling in legs   [] Varicose veins   [] Non-healing ulcers Pulmonary:   [] Uses home oxygen   [] Productive cough   [] Hemoptysis   [] Wheeze  [] COPD   [] Asthma Neurologic:  [] Dizziness  [] Blackouts   [] Seizures   [] History of stroke   [] History of TIA  [] Aphasia   [] Temporary blindness   [] Dysphagia   [] Weakness or numbness in arms   [] Weakness or numbness in legs Musculoskeletal:  [] Arthritis   [] Joint swelling   [] Joint pain   [] Low back pain Hematologic:  [] Easy bruising  [] Easy bleeding   [] Hypercoagulable state   [] Anemic  [] Hepatitis Gastrointestinal:  [] Blood in stool   [] Vomiting blood  []   Gastroesophageal reflux/heartburn   [] Difficulty swallowing. Genitourinary:   [x] Chronic kidney disease   [] Difficult urination  [] Frequent urination  [] Burning with urination   [] Blood in urine Skin:  [] Rashes   [] Ulcers   [] Wounds Psychological:  [] History of anxiety   []  History of major depression.  Physical Examination  Vitals:   01/21/19 0540 01/21/19 0633 01/21/19 0801 01/21/19 1138  BP: 117/70 114/69 127/79 127/77  Pulse: 86 89 96 90  Resp: 16 16 20 18   Temp:   97.6 F (36.4 C)   TempSrc:   Oral   SpO2: 95% 94% 96% 100%  Weight:      Height:       Body mass index is 19.69 kg/m. Gen:  WD/WN, NAD Head: [place]/AT, No temporalis wasting. Prominent temp pulse not noted. Ear/Nose/Throat: Hearing grossly intact, nares w/o erythema or drainage, oropharynx w/o Erythema/Exudate Eyes: Sclera non-icteric, conjunctiva clear Neck: Trachea midline.  No JVD.  Pulmonary:  Good air movement, respirations slightly labored, equal bilaterally.  Cardiac: RRR, normal S1, S2. Vascular:  Vessel Right Left  Radial Palpable Palpable  Ulnar Palpable Palpable  Brachial Palpable Palpable  Carotid Palpable, without bruit Palpable, without bruit  Aorta Not palpable N/A  Femoral Palpable Palpable  Popliteal Palpable Palpable  PT Palpable Palpable  DP Palpable Palpable   Right Lower Extremity: Thigh soft.  Calf soft.  Extremities warm distally in toes.  Nontender to palpation.  No pain with dorsiflexion.  There is no acute vascular compromise to extremity at this time.  Good capillary refill.  Motor/sensory is intact.  Left Lower Extremity: Thigh soft.  Calf soft.  Extremities warm distally in toes.  Nontender to palpation.  No pain with dorsiflexion.  There is no acute vascular compromise to extremity at this time.  Good capillary refill.  Motor/sensory is intact.  Gastrointestinal: soft, non-tender/non-distended. No guarding/reflex.  Musculoskeletal: M/S 5/5 throughout.  Extremities without ischemic changes.  No deformity or atrophy. Minimal edema. Neurologic: Sensation  grossly intact in extremities.  Symmetrical.  Speech is fluent. Motor exam as listed above. Psychiatric: Judgment intact, Mood & affect appropriate for pt's clinical situation. Dermatologic: No rashes or ulcers noted.  No cellulitis or open wounds. Lymph : No Cervical, Axillary, or Inguinal lymphadenopathy.  CBC Lab Results  Component Value Date   WBC 9.4 01/21/2019   HGB 8.3 (L) 01/21/2019   HCT 25.3 (L) 01/21/2019   MCV 70.5 (L) 01/21/2019   PLT 351 01/21/2019   BMET    Component Value Date/Time   NA 138 01/21/2019 0717   K 4.3 01/21/2019 0717   CL 110 01/21/2019 0717   CO2 19 (L) 01/21/2019 0717   GLUCOSE 56 (L) 01/21/2019 0717   BUN 22 01/21/2019 0717   CREATININE 1.42 (H) 01/21/2019 0717   CALCIUM 8.7 (L) 01/21/2019 0717   GFRNONAA 38 (L) 01/21/2019 0717   GFRAA 44 (L) 01/21/2019 0717   Estimated Creatinine Clearance: 32.6 mL/min (A) (by C-G formula based on SCr of 1.42 mg/dL (H)).  COAG Lab Results  Component Value Date   INR 1.3 (H) 01/19/2019   INR 1.4 (H) 12/29/2018   Radiology Ct Head Wo Contrast  Result Date: 12/31/2018 CLINICAL DATA:  Seizure EXAM: CT HEAD WITHOUT CONTRAST TECHNIQUE: Contiguous axial images were obtained from the base of the skull through the vertex without intravenous contrast. COMPARISON:  None. FINDINGS: Brain: There is no acute intracranial hemorrhage, mass-effect, or edema. There is no new loss of gray-white differentiation. Confluent areas of hypoattenuation in the supratentorial  white matter are nonspecific but likely reflect stable advanced microvascular ischemic changes. Chronic small infarcts of the left cerebellum and thalamus again noted. There is no extra-axial collection. Ventricles are stable in size. Vascular: No hyperdense vessel. Skull: Calvarium is unremarkable. Sinuses/Orbits: Mild paranasal sinus mucosal thickening. Orbits are unremarkable. Other: Mastoid air cells are clear. IMPRESSION: No acute or new findings since December 29, 2018. Stable chronic findings detailed above. Electronically Signed   By: Macy Mis M.D.   On: 12/31/2018 13:15   Ct Head Wo Contrast  Result Date: 12/29/2018 CLINICAL DATA:  Loss of consciousness EXAM: CT HEAD WITHOUT CONTRAST TECHNIQUE: Contiguous axial images were obtained from the base of the skull through the vertex without intravenous contrast. COMPARISON:  None. FINDINGS: Brain: No evidence of acute territorial infarction, hemorrhage, hydrocephalus,extra-axial collection or mass lesion/mass effect. The ventricles are normal in size and contour. Low-attenuation changes in the deep white matter consistent with small vessel ischemia. Vascular: No hyperdense vessel or unexpected calcification. Skull: The skull is intact. No fracture or focal lesion identified. Sinuses/Orbits: The visualized paranasal sinuses and mastoid air cells are clear. The orbits and globes intact. Other: None IMPRESSION: No acute intracranial abnormality. Findings consistent with chronic small vessel ischemia Electronically Signed   By: Prudencio Pair M.D.   On: 12/29/2018 23:08   Ct Angio Chest Pe W/cm &/or Wo Cm  Result Date: 01/20/2019 CLINICAL DATA:  Suspected covid, elevated LFTs, recent stroke EXAM: CT ANGIOGRAPHY CHEST, abdomen, and pelvis WITH CONTRAST TECHNIQUE: Multidetector CT imaging of the chest, abdomen, and pelvis was performed using the standard protocol during bolus administration of intravenous contrast. Multiplanar CT image reconstructions and MIPs were obtained to evaluate the vascular anatomy. CONTRAST:  15mL OMNIPAQUE IOHEXOL 350 MG/ML SOLN COMPARISON:  CT chest September 13, 2017 FINDINGS: Cardiovascular: There is a optimal opacification of the pulmonary arteries. There is a small nonocclusive thrombus seen within the left main pulmonary artery which extends into the anterior left upper lobe segmental branches and partially occlusive thrombus in the posterior left lower lobe subsegmental branches. There is  also partially occlusive thrombus seen within the posterior right upper lobe segmental branch, series 6, image 37. There is also partially occlusive thrombus seen extending into the right middle lobe subsegmental branches and posterior right lower lobe subsegmental branches. The heart is normal in size. No pericardial effusion or thickening. There is slight straightening of the interventricular septum which could be due to early right ventricular heart strain. There is normal three-vessel brachiocephalic anatomy without proximal stenosis. Scattered atherosclerosis is noted. The thoracic aorta is normal in appearance. Mediastinum/Nodes: No hilar, mediastinal, or axillary adenopathy. Thyroid gland, trachea, and esophagus demonstrate no significant findings. Lungs/Pleura: Small subcentimeter pulmonary nodule are again identified as on prior exam dating back to 2019, for example in the posterior right upper lobe there is a 5 mm pulmonary nodule, series 8, image 21 and anterior right lower lobe, series 8, image 54 a 4 mm pulmonary nodule. There is however a new small nodular opacity in the posterior left upper lobe, series 8, image 47 measuring 7 mm not seen on prior exam. No large airspace consolidation or pleural effusion is seen. Centrilobular emphysematous changes seen within both lung apices. Musculoskeletal: No chest wall abnormality. No acute or significant osseous findings. Review of the MIP images confirms the above findings. Abdomen/pelvis: Hepatobiliary: There are multiple low-density liver lesions seen throughout which were not seen on the prior exam. There is also heterogeneous enhancement seen throughout the liver parenchyma.  Central periportal edema is noted. The portal vein is patent. The patient is status post cholecystectomy. A small amount of perihepatic ascites is noted. Pancreas: Unremarkable. No pancreatic ductal dilatation or surrounding inflammatory changes. Spleen: Normal in size without focal  abnormality. Adrenals/Urinary Tract: Both adrenal glands appear normal. The kidneys and collecting system appear normal without evidence of urinary tract calculus or hydronephrosis. Bladder is unremarkable. Stomach/Bowel: The stomach, small bowel, and colon are normal in appearance. No inflammatory changes, wall thickening, or obstructive findings. Vascular/Lymphatic: There are no enlarged mesenteric, retroperitoneal, or pelvic lymph nodes. Scattered aortic atherosclerotic calcifications are seen without aneurysmal dilatation. Reproductive: The uterus and adnexa are unremarkable. Other: No evidence of abdominal wall mass or hernia. Musculoskeletal: No acute or significant osseous findings. IMPRESSION: 1. Bilateral partially occlusive segmental and subsegmental pulmonary emboli as described above. 2. Findings which could be suggestive of early right ventricular heart strain. 3. Multiple unchanged subcentimeter pulmonary nodules as on prior exam dating back to 2019. 4. New nodular opacity within the posterior left upper lobe measuring 7 mm which is nonspecific, and would recommend attention on short interval follow-up exam. 5. Centrilobular emphysematous changes. 6. Numerous hepatic lesions throughout, likely suggestive of hepatic metastatic disease. 7. Small amount of perihepatic ascites 8. These results were called by telephone at the time of interpretation on 01/20/2019 at 2:07 am to provider [facility] , who verbally acknowledged these results. Electronically Signed   By: Prudencio Pair M.D.   On: 01/20/2019 02:09   Mr Jeri Cos [id number] Contrast  Result Date: 01/01/2019 CLINICAL DATA:  Encephalopathy.  DKA. EXAM: MRI HEAD WITHOUT AND WITH CONTRAST TECHNIQUE: Multiplanar, multiecho pulse sequences of the brain and surrounding structures were obtained without and with intravenous contrast. CONTRAST:  63mL GADAVIST GADOBUTROL 1 MMOL/ML IV SOLN COMPARISON:  CT head 12/31/2018 FINDINGS: Brain: Small acute infarct left  inferior cerebellum. No other acute infarct Chronic infarct left inferior cerebellum. Extensive white matter disease bilaterally. Diffuse subcortical and deep white matter hyperintensity throughout both cerebral hemispheres. Hyperintensity is also noted in the right thalamus. Negative for hemorrhage or mass. Ventricle size normal. Normal enhancement postcontrast administration Vascular: Normal arterial flow voids Skull and upper cervical spine: No focal bony abnormality. Cervical spine fusion hardware. Sinuses/Orbits: Negative Other: None IMPRESSION: 1. Acute small infarct left medial cerebellum. 2. Extensive disease throughout the white matter most compatible with chronic ischemia. Chronic infarct left cerebellum. Electronically Signed   By: Franchot Gallo M.D.   On: 01/01/2019 15:05   Ct Abd/pelvis W/ Iv Contrast  Result Date: 01/20/2019 CLINICAL DATA:  Suspected covid, elevated LFTs, recent stroke EXAM: CT ANGIOGRAPHY CHEST, abdomen, and pelvis WITH CONTRAST TECHNIQUE: Multidetector CT imaging of the chest, abdomen, and pelvis was performed using the standard protocol during bolus administration of intravenous contrast. Multiplanar CT image reconstructions and MIPs were obtained to evaluate the vascular anatomy. CONTRAST:  6mL OMNIPAQUE IOHEXOL 350 MG/ML SOLN COMPARISON:  CT chest September 13, 2017 FINDINGS: Cardiovascular: There is a optimal opacification of the pulmonary arteries. There is a small nonocclusive thrombus seen within the left main pulmonary artery which extends into the anterior left upper lobe segmental branches and partially occlusive thrombus in the posterior left lower lobe subsegmental branches. There is also partially occlusive thrombus seen within the posterior right upper lobe segmental branch, series 6, image 37. There is also partially occlusive thrombus seen extending into the right middle lobe subsegmental branches and posterior right lower lobe subsegmental branches. The heart is  normal in size. No pericardial  effusion or thickening. There is slight straightening of the interventricular septum which could be due to early right ventricular heart strain. There is normal three-vessel brachiocephalic anatomy without proximal stenosis. Scattered atherosclerosis is noted. The thoracic aorta is normal in appearance. Mediastinum/Nodes: No hilar, mediastinal, or axillary adenopathy. Thyroid gland, trachea, and esophagus demonstrate no significant findings. Lungs/Pleura: Small subcentimeter pulmonary nodule are again identified as on prior exam dating back to 2019, for example in the posterior right upper lobe there is a 5 mm pulmonary nodule, series 8, image 21 and anterior right lower lobe, series 8, image 54 a 4 mm pulmonary nodule. There is however a new small nodular opacity in the posterior left upper lobe, series 8, image 47 measuring 7 mm not seen on prior exam. No large airspace consolidation or pleural effusion is seen. Centrilobular emphysematous changes seen within both lung apices. Musculoskeletal: No chest wall abnormality. No acute or significant osseous findings. Review of the MIP images confirms the above findings. Abdomen/pelvis: Hepatobiliary: There are multiple low-density liver lesions seen throughout which were not seen on the prior exam. There is also heterogeneous enhancement seen throughout the liver parenchyma. Central periportal edema is noted. The portal vein is patent. The patient is status post cholecystectomy. A small amount of perihepatic ascites is noted. Pancreas: Unremarkable. No pancreatic ductal dilatation or surrounding inflammatory changes. Spleen: Normal in size without focal abnormality. Adrenals/Urinary Tract: Both adrenal glands appear normal. The kidneys and collecting system appear normal without evidence of urinary tract calculus or hydronephrosis. Bladder is unremarkable. Stomach/Bowel: The stomach, small bowel, and colon are normal in appearance. No  inflammatory changes, wall thickening, or obstructive findings. Vascular/Lymphatic: There are no enlarged mesenteric, retroperitoneal, or pelvic lymph nodes. Scattered aortic atherosclerotic calcifications are seen without aneurysmal dilatation. Reproductive: The uterus and adnexa are unremarkable. Other: No evidence of abdominal wall mass or hernia. Musculoskeletal: No acute or significant osseous findings. IMPRESSION: 1. Bilateral partially occlusive segmental and subsegmental pulmonary emboli as described above. 2. Findings which could be suggestive of early right ventricular heart strain. 3. Multiple unchanged subcentimeter pulmonary nodules as on prior exam dating back to 2019. 4. New nodular opacity within the posterior left upper lobe measuring 7 mm which is nonspecific, and would recommend attention on short interval follow-up exam. 5. Centrilobular emphysematous changes. 6. Numerous hepatic lesions throughout, likely suggestive of hepatic metastatic disease. 7. Small amount of perihepatic ascites 8. These results were called by telephone at the time of interpretation on 01/20/2019 at 2:07 am to provider [facility] , who verbally acknowledged these results. Electronically Signed   By: Prudencio Pair M.D.   On: 01/20/2019 02:09   US Venous Img Lower Bilateral (dvt)  Result Date: 01/20/2019 CLINICAL DATA:  History of pulmonary embolism, now with lower extremity pain. Evaluate for DVT. EXAM: BILATERAL LOWER EXTREMITY VENOUS DOPPLER ULTRASOUND TECHNIQUE: Gray-scale sonography with graded compression, as well as color Doppler and duplex ultrasound were performed to evaluate the lower extremity deep venous systems from the level of the common femoral vein and including the common femoral, femoral, profunda femoral, popliteal and calf veins including the posterior tibial, peroneal and gastrocnemius veins when visible. The superficial great saphenous vein was also interrogated. Spectral Doppler was utilized to  evaluate flow at rest and with distal augmentation maneuvers in the common femoral, femoral and popliteal veins. COMPARISON:  Chest CTA-01/20/2019 FINDINGS: RIGHT LOWER EXTREMITY Common Femoral Vein: No evidence of thrombus. Normal compressibility, respiratory phasicity and response to augmentation. Saphenofemoral Junction: No evidence of thrombus.  Normal compressibility and flow on color Doppler imaging. Profunda Femoral Vein: No evidence of thrombus. Normal compressibility and flow on color Doppler imaging. Femoral Vein: No evidence of thrombus. Normal compressibility, respiratory phasicity and response to augmentation. Popliteal Vein: No evidence of thrombus. Normal compressibility, respiratory phasicity and response to augmentation. Calf Veins: There is hypoechoic occlusive thrombus within both paired right peroneal veins (images 58 and 59). Both paired right posterior tibial veins appear patent where imaged. Superficial Great Saphenous Vein: No evidence of thrombus. Normal compressibility. Other Findings:  None. _________________________________________________________ LEFT LOWER EXTREMITY Common Femoral Vein: No evidence of thrombus. Normal compressibility, respiratory phasicity and response to augmentation. Saphenofemoral Junction: No evidence of thrombus. Normal compressibility and flow on color Doppler imaging. Profunda Femoral Vein: No evidence of thrombus. Normal compressibility and flow on color Doppler imaging. Femoral Vein: No evidence of thrombus. Normal compressibility, respiratory phasicity and response to augmentation. Popliteal Vein: No evidence of thrombus. Normal compressibility, respiratory phasicity and response to augmentation. Calf Veins: There is hypoechoic occlusive thrombus within 1 of the paired left peroneal veins (images 29 and 30). The adjacent paired left peroneal vein as well though both paired posterior tibial veins appear patent. Superficial Great Saphenous Vein: No evidence of  thrombus. Normal compressibility. Other Findings:  None. IMPRESSION: Examination is positive for occlusive DVT involving both paired right peroneal veins as well as one of two paired left peroneal veins. There is no extension of this distal tibial DVT to the more proximal venous system of either lower extremity. Electronically Signed   By: [name] Mariscal M.D.   On: 01/20/2019 17:02   Dg Chest Portable 1 View  Result Date: 01/19/2019 CLINICAL DATA:  Shortness of breath EXAM: PORTABLE CHEST 1 VIEW COMPARISON:  12/31/2018 FINDINGS: Cardiomegaly. Lungs clear. No effusions or edema. No acute bony abnormality. IMPRESSION: Cardiomegaly.  No active disease. Electronically Signed   By: Rolm Baptise M.D.   On: 01/19/2019 23:33   Dg Chest Port 1 View  Result Date: 12/31/2018 CLINICAL DATA:  Sepsis EXAM: PORTABLE CHEST 1 VIEW COMPARISON:  December 29, 2018 which is FINDINGS: The heart size and mediastinal contours are unchanged. Again noted is prominence to the central pulmonary vasculature. There is mildly increased interstitial markings seen at both lower lungs slightly more pronounced than the prior exam. No pleural effusion. No acute osseous abnormality. IMPRESSION: Pulmonary vascular congestion and mildly increased interstitial markings at both lower lungs, slightly more pronounced than the prior exam. This could be due to interstitial edema and/or early infectious etiology. Electronically Signed   By: Prudencio Pair M.D.   On: 12/31/2018 03:38   Dg Chest Port 1 View  Result Date: 12/29/2018 CLINICAL DATA:  Altered mental status EXAM: PORTABLE CHEST 1 VIEW COMPARISON:  November 12, 2016 FINDINGS: The heart size and mediastinal contours are within normal limits. There is prominence to the central pulmonary vasculature. No large airspace consolidation or pleural effusion. IMPRESSION: Findings suggestive pulmonary vascular congestion. Electronically Signed   By: Prudencio Pair M.D.   On: 12/29/2018 21:16    Assessment/Plan The patient is a 68 year old female with multiple medical issues (see below) who presented to the [facility] emergency department via EMS with a chief complaint of "dizziness" and "shortness of breath". Found to have bilateral lower extremity DVT and PE.   1. PE: CTA is notable for "bilateral partially occlusive segmental and subsegmental pulmonary emboli with early right ventricular heart strain".  After the initiation of heparin drip the patient is currently satting  96% on 4L nasal cannula.  Patient continues to experience shortness of breath while lying in bed.  Has not ambulated since admission however would expect to become more hypoxic exertion.  Due to the size of the patient's clot burden with early right ventricular heart strain need for supplemental oxygen with continued significant shortness of breath recommend a pulmonary thrombectomy.  Pulmonary lysis is contraindicated due to the patient's recent diagnosis of an acute stroke.  Procedure, risks and benefits explained to the patient.  All questions answered.  Patient wishes to proceed.  We will plan on this tomorrow.  Continue heparin drip at this time. 2. DVT: Venous duplex notable for "occlusive DVT involving both paired right peroneal veins as well as one of two paired left peroneal veins. There is no extension of this distal tibial DVT to the more proximal venous system of either lower extremity".  At this time the patient is asymptomatic denying any lower extremity discomfort or swelling.  Physical exam is unremarkable.  There is no indication for vascular intervention to the lower extremity at this time.  Continue heparin drip. 3.  Anticoagulation: Heparin drip has been initiated.  Would continue this for at least 24 hours status post thrombectomy.  Would recommend transitioning to Eliquis 5 mg twice daily for at least 1 year.  If patient does not tolerate anticoagulation in the future we will  possibly consider insertion of an IVC filter. 4.  Chronic kidney disease: We will start normal saline at 125cc/hr and continue overnight and into tomorrow in an attempt to lessen injury to kidney function due to the use of contrast for the procedure.  Would recommend running fluids for at least 12 to 24 hours after the procedure as well.  Discussed with Dr. Mayme Genta, PA-C  01/21/2019 12:34 PM  This note was created with Dragon medical transcription system.  Any error is purely unintentional

## 2019-01-22 ENCOUNTER — Telehealth: Payer: Self-pay | Admitting: Internal Medicine

## 2019-01-22 ENCOUNTER — Encounter
Admission: EM | Disposition: A | Discharge status: Home / Self Care | Payer: Self-pay | Source: Home / Self Care | Attending: Internal Medicine

## 2019-01-22 ENCOUNTER — Encounter: Payer: Self-pay | Admitting: Vascular Surgery

## 2019-01-22 DIAGNOSIS — I2699 Other pulmonary embolism without acute cor pulmonale: Secondary | ICD-10-CM

## 2019-01-22 HISTORY — PX: PULMONARY THROMBECTOMY: CATH118295

## 2019-01-22 LAB — GLUCOSE, CAPILLARY
Glucose-Capillary: 153 mg/dL — ABNORMAL HIGH (ref 70–99)
Glucose-Capillary: 101 mg/dL — ABNORMAL HIGH (ref 70–99)
Glucose-Capillary: 66 mg/dL — ABNORMAL LOW (ref 70–99)
Glucose-Capillary: 79 mg/dL (ref 70–99)
Glucose-Capillary: 216 mg/dL — ABNORMAL HIGH (ref 70–99)
Glucose-Capillary: 165 mg/dL — ABNORMAL HIGH (ref 70–99)

## 2019-01-22 LAB — BASIC METABOLIC PANEL
Anion gap: 8 (ref 5–15)
BUN: 18 mg/dL (ref 8–23)
CO2: 20 mmol/L — ABNORMAL LOW (ref 22–32)
Calcium: 8.4 mg/dL — ABNORMAL LOW (ref 8.9–10.3)
Chloride: 114 mmol/L — ABNORMAL HIGH (ref 98–111)
Creatinine, Ser: 1.16 mg/dL — ABNORMAL HIGH (ref 0.44–1.00)
GFR calc Af Amer: 56 mL/min — ABNORMAL LOW (ref >60)
GFR calc non Af Amer: 48 mL/min — ABNORMAL LOW (ref >60)
Glucose, Bld: 44 mg/dL — CL (ref 70–99)
Potassium: 4 mmol/L (ref 3.5–5.1)
Sodium: 142 mmol/L (ref 135–145)

## 2019-01-22 LAB — COMPREHENSIVE METABOLIC PANEL
ALT: 122 U/L — ABNORMAL HIGH (ref 0–44)
AST: 311 U/L — ABNORMAL HIGH (ref 15–41)
Albumin: 2.9 g/dL — ABNORMAL LOW (ref 3.5–5.0)
Alkaline Phosphatase: 1059 U/L — ABNORMAL HIGH (ref 38–126)
Anion gap: 8 (ref 5–15)
BUN: 17 mg/dL (ref 8–23)
CO2: 19 mmol/L — ABNORMAL LOW (ref 22–32)
Calcium: 8.5 mg/dL — ABNORMAL LOW (ref 8.9–10.3)
Chloride: 110 mmol/L (ref 98–111)
Creatinine, Ser: 1.33 mg/dL — ABNORMAL HIGH (ref 0.44–1.00)
GFR calc Af Amer: 47 mL/min — ABNORMAL LOW (ref >60)
GFR calc non Af Amer: 41 mL/min — ABNORMAL LOW (ref >60)
Glucose, Bld: 221 mg/dL — ABNORMAL HIGH (ref 70–99)
Potassium: 4.5 mmol/L (ref 3.5–5.1)
Sodium: 137 mmol/L (ref 135–145)
Total Bilirubin: 0.9 mg/dL (ref 0.3–1.2)
Total Protein: 6.6 g/dL (ref 6.5–8.1)

## 2019-01-22 LAB — HEPARIN LEVEL (UNFRACTIONATED)
Heparin Unfractionated: 0.31 IU/mL (ref 0.30–0.70)
Heparin Unfractionated: 0.14 IU/mL — ABNORMAL LOW (ref 0.30–0.70)

## 2019-01-22 LAB — CBC
HCT: 25.7 % — ABNORMAL LOW (ref 36.0–46.0)
Hemoglobin: 8 g/dL — ABNORMAL LOW (ref 12.0–15.0)
MCH: 23.1 pg — ABNORMAL LOW (ref 26.0–34.0)
MCHC: 31.1 g/dL (ref 30.0–36.0)
MCV: 74.1 fL — ABNORMAL LOW (ref 80.0–100.0)
Platelets: 381 10*3/uL (ref 150–400)
RBC: 3.47 MIL/uL — ABNORMAL LOW (ref 3.87–5.11)
RDW: 14.8 % (ref 11.5–15.5)
WBC: 10.5 10*3/uL (ref 4.0–10.5)
nRBC: 0 % (ref 0.0–0.2)

## 2019-01-22 LAB — MAGNESIUM: Magnesium: 2 mg/dL (ref 1.7–2.4)

## 2019-01-22 SURGERY — PULMONARY THROMBECTOMY
Anesthesia: Moderate Sedation | Laterality: Bilateral

## 2019-01-22 MED ORDER — DIPHENHYDRAMINE HCL 50 MG/ML IJ SOLN: 50.0000 mg | Freq: Once | INTRAMUSCULAR | Status: DC | PRN

## 2019-01-22 MED ORDER — MIDAZOLAM HCL 5 MG/5ML IJ SOLN
INTRAMUSCULAR | Status: AC
  Filled 2019-01-22: qty 5

## 2019-01-22 MED ORDER — HYDROMORPHONE HCL 1 MG/ML IJ SOLN
1.0000 mg | Freq: Once | INTRAMUSCULAR | Status: AC | PRN
  Administered 2019-01-23: 1 mg via INTRAVENOUS
  Filled 2019-01-22: qty 1

## 2019-01-22 MED ORDER — MIDAZOLAM HCL 2 MG/2ML IJ SOLN
INTRAMUSCULAR | Status: DC | PRN
  Administered 2019-01-22: 1 mg via INTRAVENOUS
  Administered 2019-01-22: 2 mg via INTRAVENOUS
  Administered 2019-01-22: 1 mg via INTRAVENOUS

## 2019-01-22 MED ORDER — INSULIN GLARGINE 100 UNIT/ML ~~LOC~~ SOLN
5.0000 [IU] | Freq: Every day | SUBCUTANEOUS | Status: DC
  Administered 2019-01-22: 5 [IU] via SUBCUTANEOUS
  Filled 2019-01-22 (×2): qty 0.05

## 2019-01-22 MED ORDER — DEXTROSE 50 % IV SOLN
INTRAVENOUS | Status: AC
  Filled 2019-01-22: qty 50

## 2019-01-22 MED ORDER — FENTANYL CITRATE (PF) 100 MCG/2ML IJ SOLN
INTRAMUSCULAR | Status: AC
  Filled 2019-01-22: qty 2

## 2019-01-22 MED ORDER — DEXTROSE 50 % IV SOLN
25.0000 mL | Freq: Once | INTRAVENOUS | Status: AC
  Administered 2019-01-22: 12:00:00 25 mL via INTRAVENOUS

## 2019-01-22 MED ORDER — CEFAZOLIN SODIUM-DEXTROSE 1-4 GM/50ML-% IV SOLN
1.0000 g | Freq: Once | INTRAVENOUS | Status: AC
  Administered 2019-01-22: 1 g via INTRAVENOUS
  Filled 2019-01-22: qty 50

## 2019-01-22 MED ORDER — LACTATED RINGERS IV SOLN
INTRAVENOUS | Status: DC
  Administered 2019-01-22 – 2019-01-23 (×3): via INTRAVENOUS

## 2019-01-22 MED ORDER — SODIUM CHLORIDE 0.9 % IV SOLN: INTRAVENOUS | Status: DC

## 2019-01-22 MED ORDER — DEXTROSE 50 % IV SOLN
INTRAVENOUS | Status: AC
  Administered 2019-01-22: 25 mL via INTRAVENOUS
  Filled 2019-01-22: qty 50

## 2019-01-22 MED ORDER — ONDANSETRON HCL 4 MG/2ML IJ SOLN
4.0000 mg | Freq: Four times a day (QID) | INTRAMUSCULAR | Status: DC | PRN
  Filled 2019-01-22: qty 2

## 2019-01-22 MED ORDER — IODIXANOL 320 MG/ML IV SOLN
INTRAVENOUS | Status: DC | PRN
  Administered 2019-01-22: 80 mL via INTRA_ARTERIAL

## 2019-01-22 MED ORDER — HEPARIN SODIUM (PORCINE) 1000 UNIT/ML IJ SOLN
INTRAMUSCULAR | Status: AC
  Filled 2019-01-22: qty 1

## 2019-01-22 MED ORDER — DEXTROSE 50 % IV SOLN
1.0000 | Freq: Once | INTRAVENOUS | Status: AC
  Administered 2019-01-22: 50 mL via INTRAVENOUS

## 2019-01-22 MED ORDER — MIDAZOLAM HCL 2 MG/ML PO SYRP
8.0000 mg | ORAL_SOLUTION | Freq: Once | ORAL | Status: DC | PRN
  Filled 2019-01-22: qty 4

## 2019-01-22 MED ORDER — FAMOTIDINE 20 MG PO TABS: 40.0000 mg | ORAL_TABLET | Freq: Once | ORAL | Status: DC | PRN

## 2019-01-22 MED ORDER — HEPARIN BOLUS VIA INFUSION
1600.0000 [IU] | Freq: Once | INTRAVENOUS | Status: AC
  Administered 2019-01-22: 1600 [IU] via INTRAVENOUS
  Filled 2019-01-22: qty 1600

## 2019-01-22 MED ORDER — METHYLPREDNISOLONE SODIUM SUCC 125 MG IJ SOLR: 125.0000 mg | Freq: Once | INTRAMUSCULAR | Status: DC | PRN

## 2019-01-22 MED ORDER — FENTANYL CITRATE (PF) 100 MCG/2ML IJ SOLN
INTRAMUSCULAR | Status: DC | PRN
  Administered 2019-01-22: 25 ug via INTRAVENOUS
  Administered 2019-01-22: 12.5 ug via INTRAVENOUS
  Administered 2019-01-22: 50 ug via INTRAVENOUS

## 2019-01-22 SURGICAL SUPPLY — 14 items
CANISTER PENUMBRA ENGINE (MISCELLANEOUS) ×2 IMPLANT
CATH ANGIO 5F 100CM .035 PIG (CATHETERS) ×2 IMPLANT
CATH INFINITI JR4 5F (CATHETERS) ×2 IMPLANT
CATH LIGHTNING 8 XTORQ 115 (CATHETERS) ×2 IMPLANT
CATH SELECT BERN TIP 5F 130 (CATHETERS) ×2 IMPLANT
DEVICE SAFEGUARD 24CM (GAUZE/BANDAGES/DRESSINGS) ×4 IMPLANT
GLIDEWIRE ADV .035X260CM (WIRE) ×2 IMPLANT
PACK ANGIOGRAPHY (CUSTOM PROCEDURE TRAY) ×3 IMPLANT
SHEATH BRITE TIP 5FRX11 (SHEATH) ×2 IMPLANT
SHEATH FLEXOR 9FRX30 (SHEATH) ×2 IMPLANT
SYR MEDRAD MARK 7 150ML (SYRINGE) ×2 IMPLANT
TUBING CONTRAST HIGH PRESS 72 (TUBING) ×3 IMPLANT
WIRE J 3MM .035X145CM (WIRE) ×2 IMPLANT
WIRE MAGIC TORQUE 260C (WIRE) ×2 IMPLANT

## 2019-01-22 NOTE — Evaluation (Signed)
Physical Therapy Evaluation Patient Details Name: Audrey Chan MRN: [id number] DOB: 11-Nov-1950 Today's Date: 01/22/2019   History of Present Illness  Pt admited for multiple + PE & B LE DVT. Pt is now s/p mechanicial thrombectomy on 12/2. PMH includes aneurysm, GERD, HTN, CVA, and DM. Pt with + hepatic lesions that are suspicious of metastatic dx and is pending liver biopsy for tomorrow.  Clinical Impression  Pt is a pleasant 67 year old female who was admitted for PE/DVT and is s/p thrombectomy this date. Pt performs bed mobility, transfers, and ambulation with independence and safely. Pt demonstrates all bed mobility/transfers/ambulation at baseline level. Strength/coordination intact. Pt feels at baseline level. Pt does not require any further PT needs at this time. Pt will be dc in house and does not require follow up. RN aware. Will dc current orders.     Follow Up Recommendations No PT follow up    Equipment Recommendations  None recommended by PT    Recommendations for Other Services       Precautions / Restrictions Precautions Precautions: Fall Restrictions Weight Bearing Restrictions: No      Mobility  Bed Mobility Overal bed mobility: Modified Independent             General bed mobility comments: safe technique  Transfers Overall transfer level: Modified independent Equipment used: None             General transfer comment: safe technique with upright posture, no LOB present  Ambulation/Gait Ambulation/Gait assistance: Supervision Gait Distance (Feet): 5 Feet Assistive device: None Gait Pattern/deviations: WFL(Within Functional Limits)     General Gait Details: ambulated to [facility]. LImited due to lines/leads. Safe technique performed  Stairs            Wheelchair Mobility    Modified Rankin (Stroke Patients Only)       Balance Overall balance assessment: Independent                                            Pertinent Vitals/Pain Pain Assessment: No/denies pain    Home Living Family/patient expects to be discharged to:: Private residence Living Arrangements: (lives with sister) Available Help at Discharge: Family;Available 24 hours/day Type of Home: House Home Access: Ramped entrance     Home Layout: One level Home Equipment: Walker - 2 wheels      Prior Function Level of Independence: Independent         Comments: reports she has been indep with all mobility     Hand Dominance        Extremity/Trunk Assessment   Upper Extremity Assessment Upper Extremity Assessment: Overall WFL for tasks assessed    Lower Extremity Assessment Lower Extremity Assessment: Overall WFL for tasks assessed(didn't use resistance on R LE due to procedure)       Communication   Communication: No difficulties  Cognition Arousal/Alertness: Awake/alert Behavior During Therapy: WFL for tasks assessed/performed Overall Cognitive Status: Within Functional Limits for tasks assessed                                 General Comments: Pt has word finding difficulty, however alert and oriented otherwise      General Comments      Exercises Other Exercises Other Exercises: ambulated to [facility] with independence. Independent with hygiene.  Other Exercises: Able to perform static standing including SLS x 6 seconds on B LEs.   Assessment/Plan    PT Assessment Patent does not need any further PT services  PT Problem List         PT Treatment Interventions      PT Goals (Current goals can be found in the Care Plan section)  Acute Rehab PT Goals Patient Stated Goal: to return home PT Goal Formulation: With patient Time For Goal Achievement: 01/22/19 Potential to Achieve Goals: Good    Frequency     Barriers to discharge        Co-evaluation               AM-PAC PT "6 Clicks" Mobility  Outcome Measure Help needed turning from your back to your side while in a  flat bed without using bedrails?: None Help needed moving from lying on your back to sitting on the side of a flat bed without using bedrails?: None Help needed moving to and from a bed to a chair (including a wheelchair)?: None Help needed standing up from a chair using your arms (e.g., wheelchair or bedside chair)?: None Help needed to walk in hospital room?: None Help needed climbing 3-5 steps with a railing? : None 6 Click Score: 24    End of Session Equipment Utilized During Treatment: Oxygen Activity Tolerance: Patient tolerated treatment well Patient left: in bed Nurse Communication: Mobility status PT Visit Diagnosis: Muscle weakness (generalized) (M62.81)    Time: [id number] PT Time Calculation (min) (ACUTE ONLY): 26 min   Charges:   PT Evaluation $PT Eval Low Complexity: 1 Low PT Treatments $Therapeutic Activity: 8-22 mins        Greggory Stallion, PT, DPT [phone]   Sandy Blouch 01/22/2019, 4:13 PM

## 2019-01-22 NOTE — Progress Notes (Signed)
OT Cancellation Note  Patient Details Name: Audrey Chan MRN: [id number] DOB: [birth year]   Cancelled Treatment:    Reason Eval/Treat Not Completed: Patient at procedure or test/ unavailable  Pt going off floor for thrombectomy when OT presents for evaluation. In addition, RN reports pt performing most self care I'ly with only assist for line management. Will attempt to f/u for potential screen versus evaluation as able/as pt becomes available.   Gerrianne Scale, [place], OTR/L ascom [phone] 01/22/19, 10:09 AM

## 2019-01-22 NOTE — Progress Notes (Signed)
This is the second AM that this patient has been hypoglycemic in the Am after receiving Lantus 10 units at HS. Informed NP that D50 was given for a lab draw in the 40s this AM.

## 2019-01-22 NOTE — Progress Notes (Signed)
ANTICOAGULATION CONSULT NOTE - Initial Consult  Pharmacy Consult for Heparin Indication: pulmonary embolus  No Known Allergies  Patient Measurements: Height: 5\' 6"  (167.6 cm) Weight: 122 lb (55.3 kg) IBW/kg (Calculated) : 59.3 HEPARIN DW (KG): 55.3  Vital Signs: Temp: 98.4 F (36.9 C) (12/02 0200) Temp Source: Oral (12/02 0200) BP: 129/60 (12/02 0500) Pulse Rate: 88 (12/02 0500)  Labs: Recent Labs    01/19/19 2325 01/20/19 0709  01/20/19 0905 01/20/19 1120 01/20/19 1616 01/21/19 0717 01/22/19 0421  HGB 9.9*  --   --   --   --   --  8.3* 8.0*  HCT 31.0*  --   --   --   --   --  25.3* 25.7*  PLT 355  --   --   --   --   --  351 381  APTT  --   --   --  119*  --   --   --   --   LABPROT 15.8*  --   --   --   --   --   --   --   INR 1.3*  --   --   --   --   --   --   --   HEPARINUNFRC  --   --    < > 0.60  --  0.48 0.37 0.31  CREATININE 1.58*  --   --   --  1.33*  --  1.42* 1.16*  TROPONINIHS  --  2,101*  --   --  2,019*  --   --   --    < > = values in this interval not displayed.   Estimated Creatinine Clearance: 40 mL/min (A) (by C-G formula based on SCr of 1.16 mg/dL (H)).  Medical History: Past Medical History:  Diagnosis Date  . Allergic rhinitis   . Aneurysm of unspecified site ([place])   . Cholelithiasis   . Diabetes type 2, controlled ([place])   . GERD (gastroesophageal reflux disease)   . Hypertension   . Leg DVT (deep venous thromboembolism), acute, bilateral ([place]) 01/21/2019  . Multiple subsegmental pulmonary emboli without acute cor pulmonale ([place]) 01/20/2019  . Neuropathy   . Veno-occlusive disease    Medications:  Medications Prior to Admission  Medication Sig Dispense Refill Last Dose  . acetaminophen (TYLENOL) 500 MG tablet Take 500-1,000 mg by mouth every 6 (six) hours as needed for moderate pain.    prn at prn  . albuterol (PROVENTIL HFA;VENTOLIN HFA) 108 (90 BASE) MCG/ACT inhaler Inhale 2 puffs into the lungs every 6 (six) hours as needed for  wheezing or shortness of breath.    prn at prn  . amLODipine (NORVASC) 5 MG tablet Take 1 tablet (5 mg total) by mouth daily. 30 tablet 0 01/19/2019 at 0800  . aspirin EC 81 MG tablet Take 81 mg by mouth daily.   01/19/2019 at 0800  . cetirizine (ZYRTEC) 10 MG tablet Take 10 mg by mouth daily.    01/19/2019 at 0800  . Cholecalciferol (VITAMIN D3) 1000 units CAPS Take 1,000 Units by mouth daily.    01/19/2019 at 0800  . clopidogrel (PLAVIX) 75 MG tablet Take 75 mg by mouth daily.   01/19/2019 at 0800  . ferrous sulfate 325 (65 FE) MG tablet Take 325 mg by mouth 2 (two) times daily with a meal.    01/19/2019 at 0800  . fluticasone (FLONASE) 50 MCG/ACT nasal spray Place 2 sprays into both nostrils daily as  needed for allergies or rhinitis.    prn at prn  . gabapentin (NEURONTIN) 800 MG tablet Take 800 mg by mouth 3 (three) times daily.    01/19/2019 at 0800  . hydrochlorothiazide (HYDRODIURIL) 25 MG tablet Take 25 mg by mouth daily.   01/19/2019 at 0800  . insulin aspart (NOVOLOG) 100 UNIT/ML injection Check blood sugar prior to meals and give insulin on the following scale: Blood sugar less than 150: 0 units BS 150-200: 2 units BS 200-250: 3 units BS 250-300: 4 units BS 300-350: 5 units BS 350-400: 6 units BS above 400: 8 units and call your doctor. 10 mL 1 01/19/2019 at Unknown time  . LANTUS SOLOSTAR 100 UNIT/ML Solostar Pen Inject 25 Units into the skin at bedtime. 15 mL 0 Past Week at Unknown time  . lisinopril (ZESTRIL) 10 MG tablet Take 2 tablets (20 mg total) by mouth daily. 30 tablet 0 01/19/2019 at 0800  . metFORMIN (GLUCOPHAGE-XR) 500 MG 24 hr tablet Take 1,000 mg by mouth daily.    01/19/2019 at 0800  . metoprolol tartrate (LOPRESSOR) 25 MG tablet Take 12.5 mg by mouth 2 (two) times daily.   01/19/2019 at 0800  . Multiple Vitamin (MULTI-VITAMINS) TABS Take 1 tablet by mouth daily.   01/19/2019 at 0800  . polyethylene glycol powder (GLYCOLAX/MIRALAX) powder Take 0.5 Containers by mouth  daily as needed for moderate constipation.    prn at prn  . umeclidinium-vilanterol (ANORO ELLIPTA) 62.5-25 MCG/INH AEPB Inhale 1 puff into the lungs daily.   prn at prn  . APIDRA SOLOSTAR 100 UNIT/ML Solostar Pen Inject 0-50 Units into the skin.     Marland Kitchen diclofenac (FLECTOR) 1.3 % PTCH Apply 1 patch topically 2 (two) times daily. Apply 1 patch twice a day.     . mometasone (ELOCON) 0.1 % cream Apply 1 application topically 2 (two) times daily as needed (rash).    Not Taking at Unknown time  . rosuvastatin (CRESTOR) 20 MG tablet Take 20 mg by mouth daily.    Not Taking at Unknown time   Assessment: Pharmacy asked to initiate and monitor Heparin for PE.  Baseline labs ordered.  Per PTA med list pt not on any anticoagulants.  11/30 0230 Heparin bolus 4000 units x 1 then infusion at 950 units/hr 11/30 0905 HL 0.60 therapeutic  11/30 1616 HL 0.48 therapeutic 12/01 0717 HL 0.37 Therapeutic 12/2  0421 HL 0.31 Therapeutic.   Goal of Therapy:  Heparin level 0.3-0.7 units/ml Monitor platelets by anticoagulation protocol: Yes   Plan:  12/2 0421 HL just barely therapeutic. Will increase heparin drip to 1000units/hr since HL trending down.   Hgb: 8.3>> 8.0   Recheck  heparin level and and CBC with AM labs. Pharmacy will continue to monitor and follow.   Pernell Dupre, PharmD, BCPS Clinical Pharmacist 01/22/2019 6:25 AM

## 2019-01-22 NOTE — Progress Notes (Signed)
[place] [facility] [place] NAME: Audrey Chan    MR#:  [id number]  DATE OF BIRTH:  Jan 13, 1951  SUBJECTIVE:   Patient just got back from getting vascular procedure of thrombectomy she denies any complaints. Mild shortness of breath. REVIEW OF SYSTEMS:   Review of Systems  Constitutional: Negative for chills, fever and weight loss.  HENT: Negative for ear discharge, ear pain and nosebleeds.   Eyes: Negative for blurred vision, pain and discharge.  Respiratory: Positive for shortness of breath. Negative for sputum production, wheezing and stridor.   Cardiovascular: Negative for chest pain, palpitations, orthopnea and PND.  Gastrointestinal: Negative for abdominal pain, diarrhea, nausea and vomiting.  Genitourinary: Negative for frequency and urgency.  Musculoskeletal: Negative for back pain and joint pain.  Neurological: Positive for weakness. Negative for sensory change, speech change and focal weakness.  Psychiatric/Behavioral: Negative for depression and hallucinations. The patient is not nervous/anxious.    Tolerating Diet: yes Tolerating PT:   DRUG ALLERGIES:  No Known Allergies  VITALS:  Blood pressure 124/72, pulse 87, temperature 98.5 F (36.9 C), temperature source Oral, resp. rate (!) 27, height 5\' 6"  (1.676 m), weight 55.3 kg, SpO2 (!) 88 %.  PHYSICAL EXAMINATION:   Physical Exam  GENERAL:  68 y.o.-year-old patient lying in the bed with no acute distress.  EYES: Pupils equal, round, reactive to light and accommodation. No scleral icterus. Extraocular muscles intact.  HEENT: Head atraumatic, normocephalic. Oropharynx and nasopharynx clear.  NECK:  Supple, no jugular venous distention. No thyroid enlargement, no tenderness.  LUNGS: Normal breath sounds bilaterally, no wheezing, rales, rhonchi. No use of accessory muscles of respiration.  CARDIOVASCULAR: S1, S2 normal. No murmurs, rubs, or gallops.  ABDOMEN: Soft, nontender,  nondistended. Bowel sounds present. No organomegaly or mass.  EXTREMITIES: No cyanosis, clubbing or edema b/l.    NEUROLOGIC: Cranial nerves II through XII are intact. No focal Motor or sensory deficits b/l.   PSYCHIATRIC:  patient is alert and oriented x 3.  SKIN: No obvious rash, lesion, or ulcer.   LABORATORY PANEL:  CBC Recent Labs  Lab 01/22/19 0421  WBC 10.5  HGB 8.0*  HCT 25.7*  PLT 381    Chemistries  Recent Labs  Lab 01/20/19 1120  01/22/19 0421  NA 135   < > 142  K 4.8   < > 4.0  CL 104   < > 114*  CO2 20*   < > 20*  GLUCOSE 121*   < > 44*  BUN 24*   < > 18  CREATININE 1.33*   < > 1.16*  CALCIUM 8.8*   < > 8.4*  MG  --   --  2.0  AST 82*  --   --   ALT 83*  --   --   ALKPHOS 851*  --   --   BILITOT 0.5  --   --    < > = values in this interval not displayed.   Cardiac Enzymes No results for input(s): TROPONINI in the last 168 hours. RADIOLOGY:  No results found. ASSESSMENT AND PLAN:  68 year old female patient with history of type II diabetes with neuropathy, CVA, COPD, cerebellar stroke without heparin sequela and no prior history of malignancy/or PE-is currently admitted to hospital for acute shortness of breath.  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  BNP 876 on admission. --Remains hypoxic, 96% on 4 L, given extensive clot burden with right ventricular dysfunction - vascular surgery Dr. dew  performed bilateral pulmonary thrombectomy on 01/22/2019 -Continue IV heparin drip - Appreciate pulmonology and vascular surgery involvement.  *Bilateral lower extremity DVT. --No significant symptoms.  Vascular surgery recommends against any intervention. --Continue anticoagulation IV heparin  *multiple liver lesions as noted  on CT scan concerning for metastatic disease  -IR may perform CT guided liver biopsy tomorrow per  Dr Lanney Gins -npo after midnight  *Diabetes mellitus type 2 with peripheral neuropathy, CKD III  -on gabapentin -- continue current insulin regimen.  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 Doy Mince of neurology.  Can safely hold aspirin and Plavix now that the patient is going on anticoagulation.    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  IV fluids.  Hold lisinopril.   -f/u BMP in a.m.  *COPD, centrilobular emphysema --Appears stable.  Continue bronchodilators   DVT prophylaxis: heparin infusion Code Status: Full Family Communication: none Disposition Plan: home    TOTAL TIME TAKING CARE OF THIS PATIENT: *30 minutes.  >50% time spent on counselling and coordination of care  POSSIBLE D/C IN *1-2* DAYS, DEPENDING ON CLINICAL CONDITION.  Note: This dictation was prepared with Dragon dictation along with smaller phrase technology. Any transcriptional errors that result from this process are unintentional.  Audrey Chan M.D on 01/22/2019 at 5:03 PM  Between 7am to 6pm - Pager - [phone]  After 6pm go to [email or web address]  [facility]   CC: Primary care physician; Audrey Chan, MDPatient ID: Audrey Chan, female   DOB: [birth year], 68 y.o.   MRN: [id number]

## 2019-01-22 NOTE — Progress Notes (Signed)
FBS rechecked after D50, now 153. Continue to monitor.

## 2019-01-22 NOTE — Progress Notes (Signed)
ANTICOAGULATION CONSULT NOTE  Pharmacy Consult for Heparin Indication: pulmonary embolus  No Known Allergies  Patient Measurements: Height: 5\' 6"  (167.6 cm) Weight: 122 lb (55.3 kg) IBW/kg (Calculated) : 59.3 HEPARIN DW (KG): 55.3  Vital Signs: Temp: 98.5 F (36.9 C) (12/02 1500) Temp Source: Oral (12/02 1500) BP: 124/72 (12/02 1500) Pulse Rate: 87 (12/02 1500)  Labs: Recent Labs    01/19/19 2325 01/20/19 0709  01/20/19 0905 01/20/19 1120 01/20/19 1616 01/21/19 0717 01/22/19 0421  HGB 9.9*  --   --   --   --   --  8.3* 8.0*  HCT 31.0*  --   --   --   --   --  25.3* 25.7*  PLT 355  --   --   --   --   --  351 381  APTT  --   --   --  119*  --   --   --   --   LABPROT 15.8*  --   --   --   --   --   --   --   INR 1.3*  --   --   --   --   --   --   --   HEPARINUNFRC  --   --    < > 0.60  --  0.48 0.37 0.31  CREATININE 1.58*  --   --   --  1.33*  --  1.42* 1.16*  TROPONINIHS  --  2,101*  --   --  2,019*  --   --   --    < > = values in this interval not displayed.   Estimated Creatinine Clearance: 40 mL/min (A) (by C-G formula based on SCr of 1.16 mg/dL (H)).  Medical History: Past Medical History:  Diagnosis Date  . Allergic rhinitis   . Aneurysm of unspecified site ([place])   . Cholelithiasis   . Diabetes type 2, controlled ([place])   . GERD (gastroesophageal reflux disease)   . Hypertension   . Leg DVT (deep venous thromboembolism), acute, bilateral ([place]) 01/21/2019  . Multiple subsegmental pulmonary emboli without acute cor pulmonale ([place]) 01/20/2019  . Neuropathy   . Veno-occlusive disease    Medications:  Medications Prior to Admission  Medication Sig Dispense Refill Last Dose  . acetaminophen (TYLENOL) 500 MG tablet Take 500-1,000 mg by mouth every 6 (six) hours as needed for moderate pain.    prn at prn  . albuterol (PROVENTIL HFA;VENTOLIN HFA) 108 (90 BASE) MCG/ACT inhaler Inhale 2 puffs into the lungs every 6 (six) hours as needed for wheezing or shortness  of breath.    prn at prn  . amLODipine (NORVASC) 5 MG tablet Take 1 tablet (5 mg total) by mouth daily. 30 tablet 0 01/19/2019 at 0800  . aspirin EC 81 MG tablet Take 81 mg by mouth daily.   01/19/2019 at 0800  . cetirizine (ZYRTEC) 10 MG tablet Take 10 mg by mouth daily.    01/19/2019 at 0800  . Cholecalciferol (VITAMIN D3) 1000 units CAPS Take 1,000 Units by mouth daily.    01/19/2019 at 0800  . clopidogrel (PLAVIX) 75 MG tablet Take 75 mg by mouth daily.   01/19/2019 at 0800  . ferrous sulfate 325 (65 FE) MG tablet Take 325 mg by mouth 2 (two) times daily with a meal.    01/19/2019 at 0800  . fluticasone (FLONASE) 50 MCG/ACT nasal spray Place 2 sprays into both nostrils daily as needed for allergies  or rhinitis.    prn at prn  . gabapentin (NEURONTIN) 800 MG tablet Take 800 mg by mouth 3 (three) times daily.    01/19/2019 at 0800  . hydrochlorothiazide (HYDRODIURIL) 25 MG tablet Take 25 mg by mouth daily.   01/19/2019 at 0800  . insulin aspart (NOVOLOG) 100 UNIT/ML injection Check blood sugar prior to meals and give insulin on the following scale: Blood sugar less than 150: 0 units BS 150-200: 2 units BS 200-250: 3 units BS 250-300: 4 units BS 300-350: 5 units BS 350-400: 6 units BS above 400: 8 units and call your doctor. 10 mL 1 01/19/2019 at Unknown time  . LANTUS SOLOSTAR 100 UNIT/ML Solostar Pen Inject 25 Units into the skin at bedtime. 15 mL 0 Past Week at Unknown time  . lisinopril (ZESTRIL) 10 MG tablet Take 2 tablets (20 mg total) by mouth daily. 30 tablet 0 01/19/2019 at 0800  . metFORMIN (GLUCOPHAGE-XR) 500 MG 24 hr tablet Take 1,000 mg by mouth daily.    01/19/2019 at 0800  . metoprolol tartrate (LOPRESSOR) 25 MG tablet Take 12.5 mg by mouth 2 (two) times daily.   01/19/2019 at 0800  . Multiple Vitamin (MULTI-VITAMINS) TABS Take 1 tablet by mouth daily.   01/19/2019 at 0800  . polyethylene glycol powder (GLYCOLAX/MIRALAX) powder Take 0.5 Containers by mouth daily as needed for  moderate constipation.    prn at prn  . umeclidinium-vilanterol (ANORO ELLIPTA) 62.5-25 MCG/INH AEPB Inhale 1 puff into the lungs daily.   prn at prn  . APIDRA SOLOSTAR 100 UNIT/ML Solostar Pen Inject 0-50 Units into the skin.     Marland Kitchen diclofenac (FLECTOR) 1.3 % PTCH Apply 1 patch topically 2 (two) times daily. Apply 1 patch twice a day.     . mometasone (ELOCON) 0.1 % cream Apply 1 application topically 2 (two) times daily as needed (rash).    Not Taking at Unknown time  . rosuvastatin (CRESTOR) 20 MG tablet Take 20 mg by mouth daily.    Not Taking at Unknown time   Assessment: Pharmacy consulted for heparin dosing for 68 yo female initiated on heparin for bilateral pulmonary embolism and DVT; suspected new diagnosis metastatic disease. Patient with past medical history significant for diabetes, CVA, HTN, COPD, and hyperlipidemia. Pharmacy asked to initiate and monitor Heparin for PE.  Heparin infusing at 1000 units/hr.   Goal of Therapy:  Heparin level 0.3-0.7 units/ml Monitor platelets by anticoagulation protocol: Yes   Plan:  Patient underwent mechanical thrombectomy this am. Heparin resumed at 1150 at 1000 units/hr. Will obtain anti-Xa level at 1800.   Plan is for patient have liver biopsy on 12/3 and heparin to be paused prior. Patient to be discharged on apixaban.   Pharmacy will continue to monitor and adjust per consult.    Django Nguyen L, RPh 01/22/2019 4:19 PM

## 2019-01-22 NOTE — Progress Notes (Signed)
Pulmonary Medicine          Date: 01/22/2019,   MRN# [id number] Audrey Chan 1950/04/11     AdmissionWeight: 55.3 kg                 CurrentWeight: 55.3 kg  Referring physician: Dr. Sarajane Jews    CHIEF COMPLAINT:   Abnormal CT chest   SUBJECTIVE    S/p thrombectomy.  Weaned down to RA.  Discussed with Dr Pascal Lux IR- plan for Liver Bx Thursday or Friday depending on anticoagulation status post thrombectomy  PAST MEDICAL HISTORY   Past Medical History:  Diagnosis Date   Allergic rhinitis    Aneurysm of unspecified site [facility])    Cholelithiasis    Diabetes type 2, controlled ([place])    GERD (gastroesophageal reflux disease)    Hypertension    Leg DVT (deep venous thromboembolism), acute, bilateral ([place]) 01/21/2019   Multiple subsegmental pulmonary emboli without acute cor pulmonale ([place]) 01/20/2019   Neuropathy    Veno-occlusive disease      SURGICAL HISTORY   Past Surgical History:  Procedure Laterality Date   ABDOMINAL HYSTERECTOMY  1972   CERVICAL FUSION     CHOLECYSTECTOMY N/A 12/28/2014   Procedure: LAPAROSCOPIC CHOLECYSTECTOMY;  Surgeon: Sherri Rad, MD;  Location: [facility] ORS;  Service: General;  Laterality: N/A;   EYE SURGERY     detached retina   MASS EXCISION Left 05/06/2018   Procedure: EXCISION OF LEFT ANTECUBITAL CYST, DIABETIC;  Surgeon: Vickie Epley, MD;  Location: [facility] ORS;  Service: Vascular;  Laterality: Left;     FAMILY HISTORY   Family History  Problem Relation Age of Onset   Lung cancer Father      SOCIAL HISTORY   Social History   Tobacco Use   Smoking status: Former Smoker    Quit date: 02/21/1999    Years since quitting: 19.9   Smokeless tobacco: Never Used  Substance Use Topics   Alcohol use: No    Alcohol/week: 0.0 standard drinks   Drug use: No     MEDICATIONS    Home Medication:    Current Medication:  Current Facility-Administered Medications:    0.9 %  sodium chloride infusion,  , Intravenous, Continuous, Stegmayer, Kimberly A, PA-C, Last Rate: 125 mL/hr at 01/22/19 0752   0.9 %  sodium chloride infusion, , Intravenous, Continuous, Stegmayer, Kimberly A, PA-C   [MAR Hold] acetaminophen (TYLENOL) tablet 650 mg, 650 mg, Oral, Q6H PRN **OR** [MAR Hold] acetaminophen (TYLENOL) suppository 650 mg, 650 mg, Rectal, Q6H PRN, Clance Boll, MD   [MAR Hold] albuterol (VENTOLIN HFA) 108 (90 Base) MCG/ACT inhaler 2 puff, 2 puff, Inhalation, Q6H PRN, Clance Boll, MD   [MAR Hold] amLODipine (NORVASC) tablet 5 mg, 5 mg, Oral, Daily, Clance Boll, MD, Stopped at 01/21/19 [id number]   ceFAZolin (ANCEF) IVPB 1 g/50 mL premix, 1 g, Intravenous, Once, Stegmayer, Kimberly A, PA-C   [MAR Hold] Chlorhexidine Gluconate Cloth 2 % PADS 6 each, 6 each, Topical, Daily, Dew, Erskine Squibb, MD, 6 each at 01/21/19 1743   [MAR Hold] cholecalciferol (VITAMIN D) tablet 1,000 Units, 1,000 Units, Oral, Daily, Myles Rosenthal A, MD, 1,000 Units at 01/21/19 0914   diphenhydrAMINE (BENADRYL) injection 50 mg, 50 mg, Intravenous, Once PRN, Stegmayer, Kimberly A, PA-C   famotidine (PEPCID) tablet 40 mg, 40 mg, Oral, Once PRN, Stegmayer, Kimberly A, PA-C   fentaNYL (SUBLIMAZE) 100 MCG/2ML injection, , , ,    fentaNYL (SUBLIMAZE) injection, , ,  PRN, Algernon Huxley, MD, 50 mcg at 01/22/19 1045   [MAR Hold] ferrous sulfate tablet 325 mg, 325 mg, Oral, BID WC, Myles Rosenthal A, MD, 325 mg at 01/21/19 1814   [MAR Hold] fluticasone (FLONASE) 50 MCG/ACT nasal spray 2 spray, 2 spray, Each Nare, Daily PRN, Clance Boll, MD   [facility] Hold] gabapentin (NEURONTIN) capsule 600 mg, 600 mg, Oral, TID, Samuella Cota, MD, 600 mg at 01/21/19 2137   heparin 1000 UNIT/ML injection, , , ,    [COMPLETED] heparin bolus via infusion 4,000 Units, 4,000 Units, Intravenous, Once, 4,000 Units at 01/20/19 0340 **FOLLOWED BY** heparin ADULT infusion 100 units/mL (25000 units/216mL sodium chloride 0.45%), 1,000  Units/hr, Intravenous, Continuous, Hallaji, Sheema M, RPH, Last Rate: 10 mL/hr at 01/22/19 0752, 1,000 Units/hr at 01/22/19 0752   [MAR Hold] HYDROmorphone (DILAUDID) injection 1 mg, 1 mg, Intravenous, Once PRN, Stegmayer, Janalyn Harder, PA-C   [MAR Hold] insulin aspart (novoLOG) injection 0-9 Units, 0-9 Units, Subcutaneous, TID WC, Clance Boll, MD, 5 Units at 01/21/19 1814   [MAR Hold] insulin glargine (LANTUS) injection 10 Units, 10 Units, Subcutaneous, QHS, Samuella Cota, MD, 10 Units at 01/21/19 2229   [facility] Hold] loratadine (CLARITIN) tablet 10 mg, 10 mg, Oral, Daily, Myles Rosenthal A, MD, 10 mg at 01/21/19 0913   methylPREDNISolone sodium succinate (SOLU-MEDROL) 125 mg/2 mL injection 125 mg, 125 mg, Intravenous, Once PRN, Stegmayer, Janalyn Harder, PA-C   [MAR Hold] metoprolol tartrate (LOPRESSOR) tablet 12.5 mg, 12.5 mg, Oral, BID, Myles Rosenthal A, MD, 12.5 mg at 01/22/19 0945   midazolam (VERSED) 2 MG/ML syrup 8 mg, 8 mg, Oral, Once PRN, Stegmayer, Kimberly A, PA-C   midazolam (VERSED) 5 MG/5ML injection, , , ,    midazolam (VERSED) injection, , , PRN, Algernon Huxley, MD, 2 mg at 01/22/19 1045   [MAR Hold] multivitamin with minerals tablet 1 tablet, 1 tablet, Oral, Daily, Myles Rosenthal A, MD, 1 tablet at 01/21/19 0914   [MAR Hold] ondansetron (ZOFRAN) injection 4 mg, 4 mg, Intravenous, Q6H PRN, Stegmayer, Kimberly A, PA-C   [MAR Hold] polyethylene glycol (MIRALAX / GLYCOLAX) packet 17 g, 1 packet, Oral, Daily PRN, Clance Boll, MD   [MAR Hold] rosuvastatin (CRESTOR) tablet 20 mg, 20 mg, Oral, Daily, Myles Rosenthal A, MD, 20 mg at 01/21/19 0913   [MAR Hold] umeclidinium-vilanterol (ANORO ELLIPTA) 62.5-25 MCG/INH 1 puff, 1 puff, Inhalation, Daily, Clance Boll, MD, 1 puff at 01/21/19 0804    ALLERGIES   Patient has no known allergies.     REVIEW OF SYSTEMS    Review of Systems:  Gen:  Denies  fever, sweats, chills weigh loss  HEENT:  Denies blurred vision, double vision, ear pain, eye pain, hearing loss, nose bleeds, sore throat Cardiac:  No dizziness, chest pain or heaviness, chest tightness,edema Resp:   Denies cough or sputum porduction, shortness of breath,wheezing, hemoptysis,  Gi: Denies swallowing difficulty, stomach pain, nausea or vomiting, diarrhea, constipation, bowel incontinence Gu:  Denies bladder incontinence, burning urine Ext:   Denies Joint pain, stiffness or swelling Skin: Denies  skin rash, easy bruising or bleeding or hives Endoc:  Denies polyuria, polydipsia , polyphagia or weight change Psych:   Denies depression, insomnia or hallucinations   Other:  All other systems negative   VS: BP (!) 156/87 (BP Location: Right Arm)    Pulse 86    Temp 98.2 F (36.8 C) (Oral)    Resp 15    Ht 5\' 6"  (1.676  m)    Wt 55.3 kg    SpO2 95%    BMI 19.69 kg/m      PHYSICAL EXAM    GENERAL:NAD, no fevers, chills, no weakness no fatigue HEAD: Normocephalic, atraumatic.  EYES: Pupils equal, round, reactive to light. Extraocular muscles intact. No scleral icterus.  MOUTH: Moist mucosal membrane. Dentition intact. No abscess noted.  EAR, NOSE, THROAT: Clear without exudates. No external lesions.  NECK: Supple. No thyromegaly. No nodules. No JVD.  PULMONARY: Diffuse coarse rhonchi right sided +wheezes CARDIOVASCULAR: S1 and S2. Regular rate and rhythm. No murmurs, rubs, or gallops. No edema. Pedal pulses 2+ bilaterally.  GASTROINTESTINAL: Soft, nontender, nondistended. No masses. Positive bowel sounds. No hepatosplenomegaly.  MUSCULOSKELETAL: No swelling, clubbing, or edema. Range of motion full in all extremities.  NEUROLOGIC: Cranial nerves II through XII are intact. No gross focal neurological deficits. Sensation intact. Reflexes intact.  SKIN: No ulceration, lesions, rashes, or cyanosis. Skin warm and dry. Turgor intact.  PSYCHIATRIC: Mood, affect within normal limits. The patient is awake, alert and oriented x  3. Insight, judgment intact.       IMAGING    Ct Head Wo Contrast  Result Date: 12/31/2018 CLINICAL DATA:  Seizure EXAM: CT HEAD WITHOUT CONTRAST TECHNIQUE: Contiguous axial images were obtained from the base of the skull through the vertex without intravenous contrast. COMPARISON:  None. FINDINGS: Brain: There is no acute intracranial hemorrhage, mass-effect, or edema. There is no new loss of gray-white differentiation. Confluent areas of hypoattenuation in the supratentorial white matter are nonspecific but likely reflect stable advanced microvascular ischemic changes. Chronic small infarcts of the left cerebellum and thalamus again noted. There is no extra-axial collection. Ventricles are stable in size. Vascular: No hyperdense vessel. Skull: Calvarium is unremarkable. Sinuses/Orbits: Mild paranasal sinus mucosal thickening. Orbits are unremarkable. Other: Mastoid air cells are clear. IMPRESSION: No acute or new findings since December 29, 2018. Stable chronic findings detailed above. Electronically Signed   By: Macy Mis M.D.   On: 12/31/2018 13:15   Ct Head Wo Contrast  Result Date: 12/29/2018 CLINICAL DATA:  Loss of consciousness EXAM: CT HEAD WITHOUT CONTRAST TECHNIQUE: Contiguous axial images were obtained from the base of the skull through the vertex without intravenous contrast. COMPARISON:  None. FINDINGS: Brain: No evidence of acute territorial infarction, hemorrhage, hydrocephalus,extra-axial collection or mass lesion/mass effect. The ventricles are normal in size and contour. Low-attenuation changes in the deep white matter consistent with small vessel ischemia. Vascular: No hyperdense vessel or unexpected calcification. Skull: The skull is intact. No fracture or focal lesion identified. Sinuses/Orbits: The visualized paranasal sinuses and mastoid air cells are clear. The orbits and globes intact. Other: None IMPRESSION: No acute intracranial abnormality. Findings consistent with  chronic small vessel ischemia Electronically Signed   By: Prudencio Pair M.D.   On: 12/29/2018 23:08   Ct Angio Chest Pe W/cm &/or Wo Cm  Result Date: 01/20/2019 CLINICAL DATA:  Suspected covid, elevated LFTs, recent stroke EXAM: CT ANGIOGRAPHY CHEST, abdomen, and pelvis WITH CONTRAST TECHNIQUE: Multidetector CT imaging of the chest, abdomen, and pelvis was performed using the standard protocol during bolus administration of intravenous contrast. Multiplanar CT image reconstructions and MIPs were obtained to evaluate the vascular anatomy. CONTRAST:  25mL OMNIPAQUE IOHEXOL 350 MG/ML SOLN COMPARISON:  CT chest September 13, 2017 FINDINGS: Cardiovascular: There is a optimal opacification of the pulmonary arteries. There is a small nonocclusive thrombus seen within the left main pulmonary artery which extends into the anterior left upper lobe  segmental branches and partially occlusive thrombus in the posterior left lower lobe subsegmental branches. There is also partially occlusive thrombus seen within the posterior right upper lobe segmental branch, series 6, image 37. There is also partially occlusive thrombus seen extending into the right middle lobe subsegmental branches and posterior right lower lobe subsegmental branches. The heart is normal in size. No pericardial effusion or thickening. There is slight straightening of the interventricular septum which could be due to early right ventricular heart strain. There is normal three-vessel brachiocephalic anatomy without proximal stenosis. Scattered atherosclerosis is noted. The thoracic aorta is normal in appearance. Mediastinum/Nodes: No hilar, mediastinal, or axillary adenopathy. Thyroid gland, trachea, and esophagus demonstrate no significant findings. Lungs/Pleura: Small subcentimeter pulmonary nodule are again identified as on prior exam dating back to 2019, for example in the posterior right upper lobe there is a 5 mm pulmonary nodule, series 8, image 21 and  anterior right lower lobe, series 8, image 54 a 4 mm pulmonary nodule. There is however a new small nodular opacity in the posterior left upper lobe, series 8, image 47 measuring 7 mm not seen on prior exam. No large airspace consolidation or pleural effusion is seen. Centrilobular emphysematous changes seen within both lung apices. Musculoskeletal: No chest wall abnormality. No acute or significant osseous findings. Review of the MIP images confirms the above findings. Abdomen/pelvis: Hepatobiliary: There are multiple low-density liver lesions seen throughout which were not seen on the prior exam. There is also heterogeneous enhancement seen throughout the liver parenchyma. Central periportal edema is noted. The portal vein is patent. The patient is status post cholecystectomy. A small amount of perihepatic ascites is noted. Pancreas: Unremarkable. No pancreatic ductal dilatation or surrounding inflammatory changes. Spleen: Normal in size without focal abnormality. Adrenals/Urinary Tract: Both adrenal glands appear normal. The kidneys and collecting system appear normal without evidence of urinary tract calculus or hydronephrosis. Bladder is unremarkable. Stomach/Bowel: The stomach, small bowel, and colon are normal in appearance. No inflammatory changes, wall thickening, or obstructive findings. Vascular/Lymphatic: There are no enlarged mesenteric, retroperitoneal, or pelvic lymph nodes. Scattered aortic atherosclerotic calcifications are seen without aneurysmal dilatation. Reproductive: The uterus and adnexa are unremarkable. Other: No evidence of abdominal wall mass or hernia. Musculoskeletal: No acute or significant osseous findings. IMPRESSION: 1. Bilateral partially occlusive segmental and subsegmental pulmonary emboli as described above. 2. Findings which could be suggestive of early right ventricular heart strain. 3. Multiple unchanged subcentimeter pulmonary nodules as on prior exam dating back to 2019. 4.  New nodular opacity within the posterior left upper lobe measuring 7 mm which is nonspecific, and would recommend attention on short interval follow-up exam. 5. Centrilobular emphysematous changes. 6. Numerous hepatic lesions throughout, likely suggestive of hepatic metastatic disease. 7. Small amount of perihepatic ascites 8. These results were called by telephone at the time of interpretation on 01/20/2019 at 2:07 am to provider [facility] , who verbally acknowledged these results. Electronically Signed   By: Prudencio Pair M.D.   On: 01/20/2019 02:09   Mr Jeri Cos [id number] Contrast  Result Date: 01/01/2019 CLINICAL DATA:  Encephalopathy.  DKA. EXAM: MRI HEAD WITHOUT AND WITH CONTRAST TECHNIQUE: Multiplanar, multiecho pulse sequences of the brain and surrounding structures were obtained without and with intravenous contrast. CONTRAST:  85mL GADAVIST GADOBUTROL 1 MMOL/ML IV SOLN COMPARISON:  CT head 12/31/2018 FINDINGS: Brain: Small acute infarct left inferior cerebellum. No other acute infarct Chronic infarct left inferior cerebellum. Extensive white matter disease bilaterally. Diffuse subcortical and deep white matter  hyperintensity throughout both cerebral hemispheres. Hyperintensity is also noted in the right thalamus. Negative for hemorrhage or mass. Ventricle size normal. Normal enhancement postcontrast administration Vascular: Normal arterial flow voids Skull and upper cervical spine: No focal bony abnormality. Cervical spine fusion hardware. Sinuses/Orbits: Negative Other: None IMPRESSION: 1. Acute small infarct left medial cerebellum. 2. Extensive disease throughout the white matter most compatible with chronic ischemia. Chronic infarct left cerebellum. Electronically Signed   By: Franchot Gallo M.D.   On: 01/01/2019 15:05   Ct Abd/pelvis W/ Iv Contrast  Result Date: 01/20/2019 CLINICAL DATA:  Suspected covid, elevated LFTs, recent stroke EXAM: CT ANGIOGRAPHY CHEST, abdomen, and pelvis WITH CONTRAST  TECHNIQUE: Multidetector CT imaging of the chest, abdomen, and pelvis was performed using the standard protocol during bolus administration of intravenous contrast. Multiplanar CT image reconstructions and MIPs were obtained to evaluate the vascular anatomy. CONTRAST:  71mL OMNIPAQUE IOHEXOL 350 MG/ML SOLN COMPARISON:  CT chest September 13, 2017 FINDINGS: Cardiovascular: There is a optimal opacification of the pulmonary arteries. There is a small nonocclusive thrombus seen within the left main pulmonary artery which extends into the anterior left upper lobe segmental branches and partially occlusive thrombus in the posterior left lower lobe subsegmental branches. There is also partially occlusive thrombus seen within the posterior right upper lobe segmental branch, series 6, image 37. There is also partially occlusive thrombus seen extending into the right middle lobe subsegmental branches and posterior right lower lobe subsegmental branches. The heart is normal in size. No pericardial effusion or thickening. There is slight straightening of the interventricular septum which could be due to early right ventricular heart strain. There is normal three-vessel brachiocephalic anatomy without proximal stenosis. Scattered atherosclerosis is noted. The thoracic aorta is normal in appearance. Mediastinum/Nodes: No hilar, mediastinal, or axillary adenopathy. Thyroid gland, trachea, and esophagus demonstrate no significant findings. Lungs/Pleura: Small subcentimeter pulmonary nodule are again identified as on prior exam dating back to 2019, for example in the posterior right upper lobe there is a 5 mm pulmonary nodule, series 8, image 21 and anterior right lower lobe, series 8, image 54 a 4 mm pulmonary nodule. There is however a new small nodular opacity in the posterior left upper lobe, series 8, image 47 measuring 7 mm not seen on prior exam. No large airspace consolidation or pleural effusion is seen. Centrilobular  emphysematous changes seen within both lung apices. Musculoskeletal: No chest wall abnormality. No acute or significant osseous findings. Review of the MIP images confirms the above findings. Abdomen/pelvis: Hepatobiliary: There are multiple low-density liver lesions seen throughout which were not seen on the prior exam. There is also heterogeneous enhancement seen throughout the liver parenchyma. Central periportal edema is noted. The portal vein is patent. The patient is status post cholecystectomy. A small amount of perihepatic ascites is noted. Pancreas: Unremarkable. No pancreatic ductal dilatation or surrounding inflammatory changes. Spleen: Normal in size without focal abnormality. Adrenals/Urinary Tract: Both adrenal glands appear normal. The kidneys and collecting system appear normal without evidence of urinary tract calculus or hydronephrosis. Bladder is unremarkable. Stomach/Bowel: The stomach, small bowel, and colon are normal in appearance. No inflammatory changes, wall thickening, or obstructive findings. Vascular/Lymphatic: There are no enlarged mesenteric, retroperitoneal, or pelvic lymph nodes. Scattered aortic atherosclerotic calcifications are seen without aneurysmal dilatation. Reproductive: The uterus and adnexa are unremarkable. Other: No evidence of abdominal wall mass or hernia. Musculoskeletal: No acute or significant osseous findings. IMPRESSION: 1. Bilateral partially occlusive segmental and subsegmental pulmonary emboli as described above. 2.  Findings which could be suggestive of early right ventricular heart strain. 3. Multiple unchanged subcentimeter pulmonary nodules as on prior exam dating back to 2019. 4. New nodular opacity within the posterior left upper lobe measuring 7 mm which is nonspecific, and would recommend attention on short interval follow-up exam. 5. Centrilobular emphysematous changes. 6. Numerous hepatic lesions throughout, likely suggestive of hepatic metastatic  disease. 7. Small amount of perihepatic ascites 8. These results were called by telephone at the time of interpretation on 01/20/2019 at 2:07 am to provider [facility] , who verbally acknowledged these results. Electronically Signed   By: Prudencio Pair M.D.   On: 01/20/2019 02:09   US Venous Img Lower Bilateral (dvt)  Result Date: 01/20/2019 CLINICAL DATA:  History of pulmonary embolism, now with lower extremity pain. Evaluate for DVT. EXAM: BILATERAL LOWER EXTREMITY VENOUS DOPPLER ULTRASOUND TECHNIQUE: Gray-scale sonography with graded compression, as well as color Doppler and duplex ultrasound were performed to evaluate the lower extremity deep venous systems from the level of the common femoral vein and including the common femoral, femoral, profunda femoral, popliteal and calf veins including the posterior tibial, peroneal and gastrocnemius veins when visible. The superficial great saphenous vein was also interrogated. Spectral Doppler was utilized to evaluate flow at rest and with distal augmentation maneuvers in the common femoral, femoral and popliteal veins. COMPARISON:  Chest CTA-01/20/2019 FINDINGS: RIGHT LOWER EXTREMITY Common Femoral Vein: No evidence of thrombus. Normal compressibility, respiratory phasicity and response to augmentation. Saphenofemoral Junction: No evidence of thrombus. Normal compressibility and flow on color Doppler imaging. Profunda Femoral Vein: No evidence of thrombus. Normal compressibility and flow on color Doppler imaging. Femoral Vein: No evidence of thrombus. Normal compressibility, respiratory phasicity and response to augmentation. Popliteal Vein: No evidence of thrombus. Normal compressibility, respiratory phasicity and response to augmentation. Calf Veins: There is hypoechoic occlusive thrombus within both paired right peroneal veins (images 58 and 59). Both paired right posterior tibial veins appear patent where imaged. Superficial Great Saphenous Vein: No evidence  of thrombus. Normal compressibility. Other Findings:  None. _________________________________________________________ LEFT LOWER EXTREMITY Common Femoral Vein: No evidence of thrombus. Normal compressibility, respiratory phasicity and response to augmentation. Saphenofemoral Junction: No evidence of thrombus. Normal compressibility and flow on color Doppler imaging. Profunda Femoral Vein: No evidence of thrombus. Normal compressibility and flow on color Doppler imaging. Femoral Vein: No evidence of thrombus. Normal compressibility, respiratory phasicity and response to augmentation. Popliteal Vein: No evidence of thrombus. Normal compressibility, respiratory phasicity and response to augmentation. Calf Veins: There is hypoechoic occlusive thrombus within 1 of the paired left peroneal veins (images 29 and 30). The adjacent paired left peroneal vein as well though both paired posterior tibial veins appear patent. Superficial Great Saphenous Vein: No evidence of thrombus. Normal compressibility. Other Findings:  None. IMPRESSION: Examination is positive for occlusive DVT involving both paired right peroneal veins as well as one of two paired left peroneal veins. There is no extension of this distal tibial DVT to the more proximal venous system of either lower extremity. Electronically Signed   By: [name] Mariscal M.D.   On: 01/20/2019 17:02   Dg Chest Portable 1 View  Result Date: 01/19/2019 CLINICAL DATA:  Shortness of breath EXAM: PORTABLE CHEST 1 VIEW COMPARISON:  12/31/2018 FINDINGS: Cardiomegaly. Lungs clear. No effusions or edema. No acute bony abnormality. IMPRESSION: Cardiomegaly.  No active disease. Electronically Signed   By: Rolm Baptise M.D.   On: 01/19/2019 23:33   Dg Chest [facility] 1 View  Result  Date: 12/31/2018 CLINICAL DATA:  Sepsis EXAM: PORTABLE CHEST 1 VIEW COMPARISON:  December 29, 2018 which is FINDINGS: The heart size and mediastinal contours are unchanged. Again noted is prominence to the  central pulmonary vasculature. There is mildly increased interstitial markings seen at both lower lungs slightly more pronounced than the prior exam. No pleural effusion. No acute osseous abnormality. IMPRESSION: Pulmonary vascular congestion and mildly increased interstitial markings at both lower lungs, slightly more pronounced than the prior exam. This could be due to interstitial edema and/or early infectious etiology. Electronically Signed   By: Prudencio Pair M.D.   On: 12/31/2018 03:38   Dg Chest Port 1 View  Result Date: 12/29/2018 CLINICAL DATA:  Altered mental status EXAM: PORTABLE CHEST 1 VIEW COMPARISON:  November 12, 2016 FINDINGS: The heart size and mediastinal contours are within normal limits. There is prominence to the central pulmonary vasculature. No large airspace consolidation or pleural effusion. IMPRESSION: Findings suggestive pulmonary vascular congestion. Electronically Signed   By: Prudencio Pair M.D.   On: 12/29/2018 21:16     IMPRESSION: 1. Bilateral partially occlusive segmental and subsegmental pulmonary emboli as described above. 2. Findings which could be suggestive of early right ventricular heart strain. 3. Multiple unchanged subcentimeter pulmonary nodules as on prior exam dating back to 2019. 4. New nodular opacity within the posterior left upper lobe measuring 7 mm which is nonspecific, and would recommend attention on short interval follow-up exam. 5. Centrilobular emphysematous changes. 6. Numerous hepatic lesions throughout, likely suggestive of hepatic metastatic disease. 7. Small amount of perihepatic ascites 8. These results were called by telephone at the time of interpretation on 01/20/2019 at 2:07 am to provider [facility] , who verbally acknowledged these results.   Electronically Signed   By: Prudencio Pair M.D.   On: 01/20/2019 02:09    ASSESSMENT/PLAN   Acute hypoxemic respiratory failure secondary to non-massive bilateral partially  occlusive pulmonary embolism  -s/p thrombectomy with clinical improvement -CT abdomen with suggestion of liver metastasis - likely etiology of PE is malignancy -Please hold any oral anticoagulants and lovenox until procedures are done -s/p evaluation by oncology - appreciate input -Discussed case with Dr Pascal Lux - plan for Liver bx TTE with-Tricuspid regurgitant velocity 3.62 m/s indicative of pulmonary hypertension   Chronic COPD  -Continue DuoNeb every 6 hours while awake  -Chest physiotherapy with MetaNeb to combat atelectatic segments -Please encourage patient to use incentive spirometer at the bedside at least every hour few times No clinical signs or symptoms of acute exacerbation of COPD    Thank you for allowing me to participate in the care of this patient.   Patient/Family are satisfied with care plan and all questions have been answered.  This document was prepared using Dragon voice recognition software and may include unintentional dictation errors.     Ottie Glazier, M.D.  Division of [facility]

## 2019-01-22 NOTE — Progress Notes (Signed)
Inpatient Diabetes Program Recommendations  AACE/ADA: New Consensus Statement on Inpatient Glycemic Control (2015)  Target Ranges:  Prepandial:   less than 140 mg/dL      Peak postprandial:   less than 180 mg/dL (1-2 hours)      Critically ill patients:  140 - 180 mg/dL   Results for [name], [name] (MRN [id number]) as of 01/22/2019 09:27  Ref. Range 01/21/2019 07:30 01/21/2019 08:02 01/21/2019 10:59 01/21/2019 16:21 01/21/2019 17:29  Glucose-Capillary Latest Ref Range: 70 - 99 mg/dL 49 (L) 71 100 (H) 274 (H)  5 units NOVOLOG  287 (H)    10 units LANTUS   Results for [name], [name] (MRN [id number]) as of 01/22/2019 09:27  Ref. Range 01/22/2019 04:21  Glucose Latest Ref Range: 70 - 99 mg/dL 44 (LL)     Home DM Meds: Lantus 25 units QHS                             Novolog 2-8 units TID AC per SSI                             Metformin 1000 mg Daily  Current Orders: Lantus 10 units QHS                           Novolog Sensitive Correction Scale/ SSI (0-9 units) TID AC    MD--Hypoglycemia yesterday AM after getting 25 units Lantus the night prior.  Lantus reduced to 10 units QHS last PM.  Hypoglycemic on lab glucose this AM (44 mg/dl)--Given 50 ml D50% at 6:30am (after only getting 10 units Lantus last PM)  May consider further reducing Lantus to 5 units QHS      Hospitalized from 11/09 through 11/13 with DKA and CVA--Discharged home on Lantus 20 units QHS + Metformin 1000 mg Daily.  Endocrinologist: Dr. Gabriel Carina with [facility] 01/08/2019 after her hospitalization--Was told to take the following: Lantus 20 units QHS Apidra Insulin: Take 5 units of Apidra if your sugar is 80 - 150 Take 6 units of Apidra if your sugar is 150-210 Take 7 units of Apidra if your sugar is 211-270 Take 7 units of Apidra if your sugar is 271-330 Take 8 units of Apidra if your sugar is 331-390 Take 9 units of Apidra if your sugar is over 391     --Will follow patient during  hospitalization--  Wyn Quaker RN, MSN, CDE Diabetes Coordinator Inpatient Glycemic Control Team Team Pager: [phone] (8a-5p)

## 2019-01-22 NOTE — Progress Notes (Signed)
Pt with CBG 66. Page sent to Dr. Lucky Cowboy awaiting call back. Message left with VIR staff for MD. Call back received per Dr. Lucky Cowboy 1/2 Amp Dextrose to be given now. 1/2 Amp Dextrose  given IVP . Report provided to [facility] in ICU. Updated on Pt status. ICU RN to recheck CBG upon arrival to unit.

## 2019-01-22 NOTE — Progress Notes (Signed)
Audrey Chan   DOB:[birth year]   [id number]    Subjective: Patient had restful night.  Breathing is better.  Denies any chest pain.  She denies she.  Awaiting thrombectomy today.  Objective:  Vitals:   01/22/19 1400 01/22/19 1500  BP: (!) 120/102 124/72  Pulse: 81 87  Resp: (!) 21 (!) 27  Temp:  98.5 F (36.9 C)  SpO2: 90% (!) 88%     Intake/Output Summary (Last 24 hours) at 01/22/2019 1632 Last data filed at 01/22/2019 1500 Gross per 24 hour  Intake 2633.06 ml  Output -  Net 2633.06 ml    Physical Exam  Constitutional: She is oriented to person, place, and time.  Appears cachectic.  HENT:  Head: Normocephalic and atraumatic.  Mouth/Throat: Oropharynx is clear and moist. No oropharyngeal exudate.  Eyes: Pupils are equal, round, and reactive to light.  Neck: Normal range of motion. Neck supple.  Cardiovascular: Normal rate and regular rhythm.  Pulmonary/Chest: No respiratory distress. She has no wheezes.  Decreased air entry bilaterally.  Abdominal: Soft. Bowel sounds are normal. She exhibits no distension and no mass. There is no abdominal tenderness. There is no rebound and no guarding.  Musculoskeletal: Normal range of motion.        General: No tenderness or edema.  Neurological: She is alert and oriented to person, place, and time.  Skin: Skin is warm.  Psychiatric: Affect normal.     Labs:  Lab Results  Component Value Date   WBC 10.5 01/22/2019   HGB 8.0 (L) 01/22/2019   HCT 25.7 (L) 01/22/2019   MCV 74.1 (L) 01/22/2019   PLT 381 01/22/2019   NEUTROABS 7.6 01/19/2019    Lab Results  Component Value Date   NA 142 01/22/2019   K 4.0 01/22/2019   CL 114 (H) 01/22/2019   CO2 20 (L) 01/22/2019    Studies:  US Venous Img Lower Bilateral (dvt)  Result Date: 01/20/2019 CLINICAL DATA:  History of pulmonary embolism, now with lower extremity pain. Evaluate for DVT. EXAM: BILATERAL LOWER EXTREMITY VENOUS DOPPLER ULTRASOUND TECHNIQUE: Gray-scale  sonography with graded compression, as well as color Doppler and duplex ultrasound were performed to evaluate the lower extremity deep venous systems from the level of the common femoral vein and including the common femoral, femoral, profunda femoral, popliteal and calf veins including the posterior tibial, peroneal and gastrocnemius veins when visible. The superficial great saphenous vein was also interrogated. Spectral Doppler was utilized to evaluate flow at rest and with distal augmentation maneuvers in the common femoral, femoral and popliteal veins. COMPARISON:  Chest CTA-01/20/2019 FINDINGS: RIGHT LOWER EXTREMITY Common Femoral Vein: No evidence of thrombus. Normal compressibility, respiratory phasicity and response to augmentation. Saphenofemoral Junction: No evidence of thrombus. Normal compressibility and flow on color Doppler imaging. Profunda Femoral Vein: No evidence of thrombus. Normal compressibility and flow on color Doppler imaging. Femoral Vein: No evidence of thrombus. Normal compressibility, respiratory phasicity and response to augmentation. Popliteal Vein: No evidence of thrombus. Normal compressibility, respiratory phasicity and response to augmentation. Calf Veins: There is hypoechoic occlusive thrombus within both paired right peroneal veins (images 58 and 59). Both paired right posterior tibial veins appear patent where imaged. Superficial Great Saphenous Vein: No evidence of thrombus. Normal compressibility. Other Findings:  None. _________________________________________________________ LEFT LOWER EXTREMITY Common Femoral Vein: No evidence of thrombus. Normal compressibility, respiratory phasicity and response to augmentation. Saphenofemoral Junction: No evidence of thrombus. Normal compressibility and flow on color Doppler imaging. Profunda Femoral Vein: No evidence  of thrombus. Normal compressibility and flow on color Doppler imaging. Femoral Vein: No evidence of thrombus. Normal  compressibility, respiratory phasicity and response to augmentation. Popliteal Vein: No evidence of thrombus. Normal compressibility, respiratory phasicity and response to augmentation. Calf Veins: There is hypoechoic occlusive thrombus within 1 of the paired left peroneal veins (images 29 and 30). The adjacent paired left peroneal vein as well though both paired posterior tibial veins appear patent. Superficial Great Saphenous Vein: No evidence of thrombus. Normal compressibility. Other Findings:  None. IMPRESSION: Examination is positive for occlusive DVT involving both paired right peroneal veins as well as one of two paired left peroneal veins. There is no extension of this distal tibial DVT to the more proximal venous system of either lower extremity. Electronically Signed   By: [name] Mariscal M.D.   On: 01/20/2019 17:02    [name] # 68 year old female patient with no prior history of malignancy/or PE-is currently admitted to hospital for acute shortness of breath.  CT chest shows acute PE; multiple liver lesions  #Multiple liver lesions-with subcentimeter lung nodules-suspicious of malignancy/given history of smoking/weight loss.  Primary is unclear based upon imaging at this time.  Awaiting biopsy in the next 1 to 2 days [see below].  Discussed with the patient that post biopsy she could follow-up with me in the clinic to discuss her treatment options.  #Acute PE-right heart strain status post thrombectomy-on anticoagulation.  Improved.  Discussed with pulmonary.   #Microcytic anemia-unclear etiology-anemia of chronic disease [more likely given elevated ferritin >1000] reviewed iron studies.  [Baseline 10-11 hemoglobin]; hemoglobin 8.  Proceed with blood transfusion if less than 8.     Audrey Sickle, MD 01/22/2019  4:32 PM

## 2019-01-22 NOTE — Op Note (Signed)
[name]  Percutaneous Study/Intervention Procedural Note   Date of Surgery: 01/22/2019,11:42 AM  Surgeon: Leotis Pain  Pre-operative Diagnosis: Symptomatic bilateral pulmonary emboli  Post-operative diagnosis:  Same  Procedure(s) Performed:  1.  Contrast injection right heart  2.  Mechanical thrombectomy left lower lobe and left upper lobe pulmonary arteries and mechanical thrombectomy to the right middle lobe and right upper lobe pulmonary arteries  4.  Selective catheter placement right right upper lobe, right middle lobe, and right lower lobe pulmonary arteries  5.  Selective catheter placement left left lower lobe and left upper lobe pulmonary arteries    Anesthesia: Conscious sedation was administered under my direct supervision by the interventional radiology RN. IV Versed plus fentanyl were utilized. Continuous ECG, pulse oximetry and blood pressure was monitored throughout the entire procedure.  Versed and fentanyl were administered intravenously.  Conscious sedation was administered for a total of 40 minutes using 4 of Versed and 87.5 mcg of Fentanyl.  EBL: 350 cc  Sheath: 9 French right vein  Contrast: 85 cc   Fluoroscopy Time: 12.8 minutes  Indications:  Patient presents with pulmonary emboli. The patient is symptomatic with hypoxemia and dyspnea on exertion.  There is evidence of right heart strain on the CT angiogram. The patient is otherwise a good candidate for intervention and even the long-term benefits pulmonary angiography with thrombolysis is offered. The risks and benefits are reviewed long-term benefits are discussed. All questions are answered patient agrees to proceed.  Procedure:  Audrey Chan a 68 y.o. female who was identified and appropriate procedural time out was performed.  The patient was then placed supine on the table and prepped and draped in the usual sterile fashion.  Ultrasound was used to evaluate the right common  femoral vein.  It was patent, as it was echolucent and compressible.  A digital ultrasound image was acquired for the permanent record.  A Seldinger needle was used to access the right common femoral vein under direct ultrasound guidance.  A 0.035 J wire was advanced without resistance and a 5Fr sheath was placed and then upsized to an 8 [place] sheath.    The wire and pigtail catheter were then negotiated into the right atrium and bolus injection of contrast was utilized to demonstrate the right ventricle and the pulmonary artery outflow. The wire and catheter were then negotiated into the main pulmonary artery where hand injection of contrast was utilized to demonstrate the pulmonary arteries and confirm the locations of the pulmonary emboli.  The Penumbra Cat 8 catheter was then advanced up into the pulmonary vasculature. The left lung was addressed first. Catheter was negotiated into the left lower lobe initially and selective imaging was performed showing thrombus into the main left lower lobe branches and the primary left lower lobe pulmonary artery and mechanical thrombectomy was performed. Follow-up imaging demonstrated a good result with a small amount of residual thrombus in the left lower lobe pulmonary artery branches and therefore the catheter was renegotiated into the upper lobe pulmonary artery and again mechanical thrombectomy was performed after selective imaging.  The penumbra cat 8 device was used and mechanical thrombectomy was done to the left upper lobe pulmonary artery with good result and only a small amount of residual thrombus on follow-up imaging.  The Penumbra Cat 8 catheter was then negotiated to the opposite side. The right lung was then addressed. Catheter was negotiated into the right side and down into the right lower lobe pulmonary artery.  Imaging was performed which showed a scant amount of thrombus in the right lower lobe pulmonary artery so we redirected the catheter and  then cannulated the right upper lobe pulmonary artery where selective imaging showed a moderate to large amount of thrombus.  At this point, mechanical thrombectomy was performed in the right upper lobe pulmonary artery. Follow-up imaging demonstrated a good result and therefore the catheter was renegotiated into the right middle lobe pulmonary artery and again mechanical thrombectomy was performed after selective imaging. Follow-up imaging was then performed with the catheter back in the right main pulmonary artery showing marked improvement in the right upper lobe pulmonary artery with no significant residual thrombus and a mild amount of residual thrombus remaining in the right middle lobe pulmonary artery.  Thrombectomy is not performed to the right lower lobe as the amount of thrombus there was quite small.  After review these images wires were reintroduced and the catheter was removed. Then, the sheath is then pulled and pressures held. A safeguard is placed.    Findings:   Right heart imaging:  Right atrium and right ventricle and the pulmonary outflow tract appears dilated and somewhat sluggish.  Right lung: Scant amount of thrombus in the right lower lobe pulmonary artery.  The right middle lobe pulmonary artery had a moderate amount of thrombus, the right upper lobe had a moderate to large amount of thrombus.  These were best seen on selective imaging with catheter placement in the selective branches.  Left lung: Left lower lobe had thrombus in 2 of the primary branches in the main left lower lobe pulmonary artery.  The left upper lobe had a small amount of thrombus in the main upper lobe pulmonary artery.    Disposition: Patient was taken to the recovery room in stable condition having tolerated the procedure well.  Audrey Chan 01/22/2019,11:42 AM

## 2019-01-22 NOTE — H&P (Signed)
[facility] History & Physical Update  The patient was interviewed and re-examined.  The patient's previous History and Physical has been reviewed and is unchanged.  There is no change in the plan of care. We plan to proceed with the scheduled procedure.  Leotis Pain, MD  01/22/2019, 10:30 AM

## 2019-01-22 NOTE — Telephone Encounter (Signed)
Late entry - on 12/01 evening- I spoke to patient Sister [name] [phone] regarding patient's clinical status-acute PE/awaiting thrombectomy; also multiple liver lesions highly concerning for malignancy-awaiting biopsy.

## 2019-01-22 NOTE — Progress Notes (Signed)
PT Cancellation Note  Patient Details Name: Audrey Chan MRN: [id number] DOB: Aug 28, 1950   Cancelled Treatment:    Reason Eval/Treat Not Completed: Other (comment). Pt off unit for thrombectomy this date. Will re-attempt next available time.   Etty Isaac 01/22/2019, 10:41 AM Greggory Stallion, PT, DPT [phone]

## 2019-01-23 ENCOUNTER — Inpatient Hospital Stay: Payer: Medicare PPO

## 2019-01-23 ENCOUNTER — Telehealth: Payer: Self-pay | Admitting: Internal Medicine

## 2019-01-23 DIAGNOSIS — E440 Moderate protein-calorie malnutrition: Secondary | ICD-10-CM | POA: Insufficient documentation

## 2019-01-23 LAB — CBC
HCT: 22.9 % — ABNORMAL LOW (ref 36.0–46.0)
Hemoglobin: 7.5 g/dL — ABNORMAL LOW (ref 12.0–15.0)
MCH: 23.1 pg — ABNORMAL LOW (ref 26.0–34.0)
MCHC: 32.8 g/dL (ref 30.0–36.0)
MCV: 70.5 fL — ABNORMAL LOW (ref 80.0–100.0)
Platelets: 345 10*3/uL (ref 150–400)
RBC: 3.25 MIL/uL — ABNORMAL LOW (ref 3.87–5.11)
RDW: 14.6 % (ref 11.5–15.5)
WBC: 8.6 10*3/uL (ref 4.0–10.5)
nRBC: 0 % (ref 0.0–0.2)

## 2019-01-23 LAB — PREPARE RBC (CROSSMATCH)

## 2019-01-23 LAB — GLUCOSE, CAPILLARY
Glucose-Capillary: 60 mg/dL — ABNORMAL LOW (ref 70–99)
Glucose-Capillary: 87 mg/dL (ref 70–99)
Glucose-Capillary: 130 mg/dL — ABNORMAL HIGH (ref 70–99)
Glucose-Capillary: 246 mg/dL — ABNORMAL HIGH (ref 70–99)

## 2019-01-23 LAB — CEA: CEA: 396 ng/mL — ABNORMAL HIGH (ref 0.0–4.7)

## 2019-01-23 LAB — HEPARIN LEVEL (UNFRACTIONATED)
Heparin Unfractionated: 0.34 IU/mL (ref 0.30–0.70)
Heparin Unfractionated: 0.35 IU/mL (ref 0.30–0.70)

## 2019-01-23 LAB — CANCER ANTIGEN 15-3: CA 15-3: 56.8 U/mL — ABNORMAL HIGH (ref 0.0–25.0)

## 2019-01-23 LAB — CANCER ANTIGEN 19-9: CA 19-9: 8 U/mL (ref 0–35)

## 2019-01-23 LAB — ABO/RH: ABO/RH(D): O POS

## 2019-01-23 LAB — CANCER ANTIGEN 27.29: CA 27.29: 150.7 U/mL — ABNORMAL HIGH (ref 0.0–38.6)

## 2019-01-23 MED ORDER — HEPARIN SOD (PORK) LOCK FLUSH 100 UNIT/ML IV SOLN
500.0000 [IU] | Freq: Every day | INTRAVENOUS | Status: DC | PRN
  Filled 2019-01-23: qty 5

## 2019-01-23 MED ORDER — INSULIN ASPART 100 UNIT/ML ~~LOC~~ SOLN
0.0000 [IU] | SUBCUTANEOUS | Status: DC
  Administered 2019-01-23: 2 [IU] via SUBCUTANEOUS
  Administered 2019-01-24: 3 [IU] via SUBCUTANEOUS
  Administered 2019-01-24: 12:00:00 5 [IU] via SUBCUTANEOUS
  Administered 2019-01-24: 2 [IU] via SUBCUTANEOUS
  Administered 2019-01-24: 3 [IU] via SUBCUTANEOUS
  Filled 2019-01-23 (×5): qty 1

## 2019-01-23 MED ORDER — HEPARIN (PORCINE) 25000 UT/250ML-% IV SOLN
1200.0000 [IU]/h | INTRAVENOUS | Status: DC
  Administered 2019-01-23 – 2019-01-24 (×2): 1200 [IU]/h via INTRAVENOUS
  Filled 2019-01-23 (×2): qty 250

## 2019-01-23 MED ORDER — SODIUM CHLORIDE 0.9% FLUSH: 10.0000 mL | INTRAVENOUS | Status: DC | PRN

## 2019-01-23 MED ORDER — ENSURE MAX PROTEIN PO LIQD
11.0000 [oz_av] | Freq: Two times a day (BID) | ORAL | Status: DC
  Administered 2019-01-23 – 2019-01-24 (×3): 11 [oz_av] via ORAL
  Filled 2019-01-23: qty 330

## 2019-01-23 MED ORDER — ACETAMINOPHEN 325 MG PO TABS
650.0000 mg | ORAL_TABLET | Freq: Once | ORAL | Status: AC
  Administered 2019-01-23: 650 mg via ORAL
  Filled 2019-01-23: qty 2

## 2019-01-23 MED ORDER — IPRATROPIUM-ALBUTEROL 0.5-2.5 (3) MG/3ML IN SOLN
3.0000 mL | Freq: Three times a day (TID) | RESPIRATORY_TRACT | Status: DC
  Administered 2019-01-23 – 2019-01-25 (×5): 3 mL via RESPIRATORY_TRACT
  Filled 2019-01-23 (×5): qty 3

## 2019-01-23 MED ORDER — SODIUM CHLORIDE 0.9% FLUSH: 3.0000 mL | INTRAVENOUS | Status: DC | PRN

## 2019-01-23 MED ORDER — HEPARIN BOLUS VIA INFUSION
2000.0000 [IU] | Freq: Once | INTRAVENOUS | Status: AC
  Administered 2019-01-23: 15:00:00 2000 [IU] via INTRAVENOUS
  Filled 2019-01-23: qty 2000

## 2019-01-23 MED ORDER — SODIUM CHLORIDE 0.9% IV SOLUTION
250.0000 mL | Freq: Once | INTRAVENOUS | Status: AC
  Administered 2019-01-23: 250 mL via INTRAVENOUS

## 2019-01-23 MED ORDER — INSULIN ASPART 100 UNIT/ML ~~LOC~~ SOLN: 0.0000 [IU] | SUBCUTANEOUS | Status: DC

## 2019-01-23 MED ORDER — HEPARIN SOD (PORK) LOCK FLUSH 100 UNIT/ML IV SOLN
250.0000 [IU] | INTRAVENOUS | Status: DC | PRN
  Filled 2019-01-23: qty 5

## 2019-01-23 NOTE — Progress Notes (Signed)
Inpatient Diabetes Program Recommendations  AACE/ADA: New Consensus Statement on Inpatient Glycemic Control (2015)  Target Ranges:  Prepandial:   less than 140 mg/dL      Peak postprandial:   less than 180 mg/dL (1-2 hours)      Critically ill patients:  140 - 180 mg/dL   Lab Results  Component Value Date   GLUCAP 60 (L) 01/23/2019   HGBA1C 9.5 (H) 01/20/2019    Review of Glycemic Control Results for Audrey Chan, Audrey Chan (MRN [id number]) as of 01/23/2019 11:16  Ref. Range 01/22/2019 12:07 01/22/2019 13:31 01/22/2019 16:37 01/22/2019 22:03 01/23/2019 07:39  Glucose-Capillary Latest Ref Range: 70 - 99 mg/dL 66 (L) 79 216 (H)  Novolog 3units 165 (H)  Lantus 5units 60 (L)    Diabetes history: DM2 Outpatient Diabetes medications: Lantus 25 units QD + Novolog 2-8 units TID + Metformin 1000mg  QD Current orders for Inpatient glycemic control: Novolog 0-9 units TID (Lantus discontinued last night after HS dose of 5 units)  Inpatient Diabetes Program Recommendations:     Note:Patient hypoglycemic again this am  -Novolog 0-6 units Q4H   Thank you, Geoffry Paradise, RN, BSN Diabetes Coordinator Inpatient Diabetes Program [phone] (team pager from 8a-5p)

## 2019-01-23 NOTE — Progress Notes (Signed)
BARBARA VEA   DOB:Sep 18, 1950   [id number]    Subjective: Patient resting in the bed comfortably.  Patient had thrombectomy yesterday.  Shortness of breath on minimal exertion.  No cough or hemoptysis.  Objective:  Vitals:   01/23/19 1515 01/23/19 1530  BP:    Pulse: 91 91  Resp: 14 15  Temp:    SpO2: 97% 97%     Intake/Output Summary (Last 24 hours) at 01/23/2019 1812 Last data filed at 01/23/2019 1500 Gross per 24 hour  Intake 3579.26 ml  Output 400 ml  Net 3179.26 ml    Physical Exam  Constitutional: She is oriented to person, place, and time and well-developed, well-nourished, and in no distress.  HENT:  Head: Normocephalic and atraumatic.  Mouth/Throat: Oropharynx is clear and moist. No oropharyngeal exudate.  Eyes: Pupils are equal, round, and reactive to light.  Neck: Normal range of motion. Neck supple.  Cardiovascular: Normal rate and regular rhythm.  Pulmonary/Chest: No respiratory distress. She has no wheezes.  Decreased air entry bilaterally.  Abdominal: Soft. Bowel sounds are normal. She exhibits no distension and no mass. There is no abdominal tenderness. There is no rebound and no guarding.  Musculoskeletal: Normal range of motion.        General: No tenderness or edema.  Neurological: She is alert and oriented to person, place, and time.  Skin: Skin is warm.  Psychiatric: Affect normal.     Labs:  Lab Results  Component Value Date   WBC 8.6 01/23/2019   HGB 7.5 (L) 01/23/2019   HCT 22.9 (L) 01/23/2019   MCV 70.5 (L) 01/23/2019   PLT 345 01/23/2019   NEUTROABS 7.6 01/19/2019    Lab Results  Component Value Date   NA 137 01/22/2019   K 4.5 01/22/2019   CL 110 01/22/2019   CO2 19 (L) 01/22/2019    Studies:  Dg Chest Port 1 View  Result Date: 01/23/2019 CLINICAL DATA:  Hypertension, pulmonary emboli EXAM: PORTABLE CHEST 1 VIEW COMPARISON:  01/20/2019, 01/19/2019 FINDINGS: The heart size and mediastinal contours are stable. No new focal  airspace consolidation, pleural effusion, or pneumothorax. Lungs are mildly hyperexpanded. The visualized skeletal structures are unremarkable. IMPRESSION: Stable appearance of the chest without new airspace opacity. Electronically Signed   By: Davina Poke M.D.   On: 01/23/2019 12:54    [name] # 68 year old female patient with no prior history of malignancy/or PE-is currently admitted to hospital for acute shortness of breath.  CT chest shows acute PE; multiple liver lesions  #Multiple liver lesions-with subcentimeter lung nodules-suspicious of malignancy/given history of smoking/weight loss.  Primary is unclear based upon imaging at this time.  Liver biopsy on hold because of patient most recently being on Plavix; as per the discussion with primary team-likely outpatient-see below.  #Acute PE-right heart strain status post thrombectomy-recommend Lovenox at discharge-as patient is expected to have biopsy on December 7/Monday outpatient.  Will transition the patient to Eliquis on outpatient basis after biopsy.  #Microcytic anemia-unclear etiology-worse- anemia of chronic disease [more likely given elevated ferritin >1000] reviewed iron studies.  [Baseline 10-11 hemoglobin]; hemoglobin 7.5;  Proceed with blood transfusion today; discussed pro and cons of blood transfusion in detail. Pt in agreement to get PRBC transfusion. Will speak to pt's sister Apolonio Schneiders today. Discussed with Dr.Patel.   [name], MD 01/23/2019  6:12 PM

## 2019-01-23 NOTE — Progress Notes (Signed)
Initial Nutrition Assessment  DOCUMENTATION CODES:   Non-severe (moderate) malnutrition in context of chronic illness  INTERVENTION:  Recommend advancing to a liberalized regular diet following procedure.  Provide Ensure Max Protein po BID, each supplement provides 150 kcal and 30 grams of protein.  Provide daily MVI.  Encouraged adequate intake of calories and protein with diet advancement.  NUTRITION DIAGNOSIS:   Moderate Malnutrition related to chronic illness(COPD, CKD, liver lesions concerning for metastatic disease) as evidenced by mild fat depletion, mild-moderate muscle depletion, 8.3% weight loss over 4 months.  GOAL:   Patient will meet greater than or equal to 90% of their needs  MONITOR:   PO intake, Supplement acceptance, Labs, Weight trends, I & O's  REASON FOR ASSESSMENT:   Malnutrition Screening Tool    ASSESSMENT:   68 year old female with PMHx of HTN, GERD, DM type 2, neuropathy, CKD, hx DVT to bilateral legs, recent cerebellar stroke, COPD admitted with bilateral pulmonary embolism with suspected right heart strain s/p bilateral pulmonary thrombectomy on 12/2, multiple liver lesions noted on CT scan concerning for metastatic disease.   Met with patient at bedside. She is currently NPO for planned liver biopsy later today. She was previously on heart healthy diet. She reports she has had a decreased appetite and intake for a while now but is unsure exactly how long. She has only been eating one meal per day. She had difficulty describing what she typically eats but she reports it is a small meal. She tries to eat some source of protein at her meal. She has also been drinking protein shakes at home but cannot remember the brand.  Patient reports her UBW was 135 lbs (61.4 kg) and that she has been losing weight. According to chart she was 63.5 kg on 09/15/2018. She was 60.3 kg on 10/07/2018 which is closer to her reported UBW. She is currently 55.3 kg (122 lbs).  She has lost at least 5 kg (8.3% body weight) over the past 4 months, which is significant for time frame.  Medications reviewed and include: vitamin D 1000 units daily, ferrous sulfate 325 mg BID, gabapentin, Novolog 0-9 units TID, Lantus 5 units QHS, MVI daily, Crestor, LR at 125 mL/hr.  Labs reviewed: CBG 60-165.  NUTRITION - FOCUSED PHYSICAL EXAM:    Most Recent Value  Orbital Region  Mild depletion  Upper Arm Region  Mild depletion  Thoracic and Lumbar Region  Mild depletion  Buccal Region  Mild depletion  Temple Region  Moderate depletion  Clavicle Bone Region  Moderate depletion  Clavicle and Acromion Bone Region  Mild depletion  Scapular Bone Region  Mild depletion  Dorsal Hand  Mild depletion  Patellar Region  Mild depletion  Anterior Thigh Region  Mild depletion  Posterior Calf Region  Mild depletion  Edema (RD Assessment)  None  Hair  Reviewed  Eyes  Reviewed  Mouth  Reviewed  Skin  Reviewed  Nails  Reviewed     Diet Order:   Diet Order            Diet NPO time specified Except for: Sips with Meds  Diet effective now             EDUCATION NEEDS:   Education needs have been addressed  Skin:  Skin Assessment: Reviewed RN Assessment  Last BM:  01/22/2019 - large type 5  Height:   Ht Readings from Last 1 Encounters:  01/19/19 '5\' 6"'$  (1.676 m)   Weight:   Wt  Readings from Last 1 Encounters:  01/19/19 55.3 kg   Ideal Body Weight:  59.1 kg  BMI:  Body mass index is 19.69 kg/m.  Estimated Nutritional Needs:   Kcal:  1500-1700  Protein:  75-85 grams  Fluid:  1.5-1.7 L/day  Jacklynn Barnacle, MS, RD, LDN Office: [phone] Pager: [phone] After Hours/Weekend Pager: [phone]

## 2019-01-23 NOTE — Progress Notes (Signed)
ANTICOAGULATION CONSULT NOTE  Pharmacy Consult for Heparin Indication: pulmonary embolus  No Known Allergies  Patient Measurements: Height: 5\' 6"  (167.6 cm) Weight: 122 lb (55.3 kg) IBW/kg (Calculated) : 59.3 HEPARIN DW (KG): 55.3  Vital Signs: Temp: 98 F (36.7 C) (12/03 1258) Temp Source: Oral (12/03 1258) BP: 128/77 (12/03 1400) Pulse Rate: 91 (12/03 1530)  Labs: Recent Labs    01/21/19 0717 01/22/19 0421 01/22/19 1631 01/22/19 2121 01/23/19 0501  HGB 8.3* 8.0*  --   --  7.5*  HCT 25.3* 25.7*  --   --  22.9*  PLT 351 381  --   --  345  HEPARINUNFRC 0.37 0.31  --  0.14* 0.34  CREATININE 1.42* 1.16* 1.33*  --   --    Estimated Creatinine Clearance: 34.9 mL/min (A) (by C-G formula based on SCr of 1.33 mg/dL (H)).  Medical History: Past Medical History:  Diagnosis Date  . Allergic rhinitis   . Aneurysm of unspecified site ([place])   . Cholelithiasis   . Diabetes type 2, controlled ([place])   . GERD (gastroesophageal reflux disease)   . Hypertension   . Leg DVT (deep venous thromboembolism), acute, bilateral ([place]) 01/21/2019  . Multiple subsegmental pulmonary emboli without acute cor pulmonale ([place]) 01/20/2019  . Neuropathy   . Veno-occlusive disease    Medications:  Medications Prior to Admission  Medication Sig Dispense Refill Last Dose  . acetaminophen (TYLENOL) 500 MG tablet Take 500-1,000 mg by mouth every 6 (six) hours as needed for moderate pain.    prn at prn  . albuterol (PROVENTIL HFA;VENTOLIN HFA) 108 (90 BASE) MCG/ACT inhaler Inhale 2 puffs into the lungs every 6 (six) hours as needed for wheezing or shortness of breath.    prn at prn  . amLODipine (NORVASC) 5 MG tablet Take 1 tablet (5 mg total) by mouth daily. 30 tablet 0 01/19/2019 at 0800  . aspirin EC 81 MG tablet Take 81 mg by mouth daily.   01/19/2019 at 0800  . cetirizine (ZYRTEC) 10 MG tablet Take 10 mg by mouth daily.    01/19/2019 at 0800  . Cholecalciferol (VITAMIN D3) 1000 units CAPS Take  1,000 Units by mouth daily.    01/19/2019 at 0800  . clopidogrel (PLAVIX) 75 MG tablet Take 75 mg by mouth daily.   01/19/2019 at 0800  . ferrous sulfate 325 (65 FE) MG tablet Take 325 mg by mouth 2 (two) times daily with a meal.    01/19/2019 at 0800  . fluticasone (FLONASE) 50 MCG/ACT nasal spray Place 2 sprays into both nostrils daily as needed for allergies or rhinitis.    prn at prn  . gabapentin (NEURONTIN) 800 MG tablet Take 800 mg by mouth 3 (three) times daily.    01/19/2019 at 0800  . hydrochlorothiazide (HYDRODIURIL) 25 MG tablet Take 25 mg by mouth daily.   01/19/2019 at 0800  . insulin aspart (NOVOLOG) 100 UNIT/ML injection Check blood sugar prior to meals and give insulin on the following scale: Blood sugar less than 150: 0 units BS 150-200: 2 units BS 200-250: 3 units BS 250-300: 4 units BS 300-350: 5 units BS 350-400: 6 units BS above 400: 8 units and call your doctor. 10 mL 1 01/19/2019 at Unknown time  . LANTUS SOLOSTAR 100 UNIT/ML Solostar Pen Inject 25 Units into the skin at bedtime. 15 mL 0 Past Week at Unknown time  . lisinopril (ZESTRIL) 10 MG tablet Take 2 tablets (20 mg total) by mouth daily. 30 tablet  0 01/19/2019 at 0800  . metFORMIN (GLUCOPHAGE-XR) 500 MG 24 hr tablet Take 1,000 mg by mouth daily.    01/19/2019 at 0800  . metoprolol tartrate (LOPRESSOR) 25 MG tablet Take 12.5 mg by mouth 2 (two) times daily.   01/19/2019 at 0800  . Multiple Vitamin (MULTI-VITAMINS) TABS Take 1 tablet by mouth daily.   01/19/2019 at 0800  . polyethylene glycol powder (GLYCOLAX/MIRALAX) powder Take 0.5 Containers by mouth daily as needed for moderate constipation.    prn at prn  . umeclidinium-vilanterol (ANORO ELLIPTA) 62.5-25 MCG/INH AEPB Inhale 1 puff into the lungs daily.   prn at prn  . APIDRA SOLOSTAR 100 UNIT/ML Solostar Pen Inject 0-50 Units into the skin.     Marland Kitchen diclofenac (FLECTOR) 1.3 % PTCH Apply 1 patch topically 2 (two) times daily. Apply 1 patch twice a day.     .  mometasone (ELOCON) 0.1 % cream Apply 1 application topically 2 (two) times daily as needed (rash).    Not Taking at Unknown time  . rosuvastatin (CRESTOR) 20 MG tablet Take 20 mg by mouth daily.    Not Taking at Unknown time   Assessment: Pharmacy consulted for heparin dosing for 68 yo female initiated on heparin for bilateral pulmonary embolism and DVT; suspected new diagnosis metastatic disease. Patient with past medical history significant for diabetes, CVA, HTN, COPD, and hyperlipidemia. Pharmacy asked to initiate and monitor Heparin for PE.  Heparin infusing at 1200 units/hr.    Goal of Therapy:  Heparin level 0.3-0.7 units/ml Monitor platelets by anticoagulation protocol: Yes   Plan:  Heparin restarted at 1200 units/hr with 2000 unit bolus. Will obtain anti-Xa level at 2100 and CBC with am labs.   Patient to be discharged on apixaban.   Pharmacy will continue to monitor and adjust per consult.    Kaisy Severino L 01/23/2019 5:11 PM

## 2019-01-23 NOTE — Progress Notes (Signed)
OT Cancellation Note  Patient Details Name: Audrey Chan MRN: [id number] DOB: Jun 30, 1950   Cancelled Treatment:    Reason Eval/Treat Not Completed: OT screened, no needs identified, will sign off  Upon chart review and in speaking with RN and PT, pt does not appear to have any acute OT needs at this time. Will complete order. Thank you for consult.  Gerrianne Scale, [place], OTR/L ascom [phone] 01/23/19, 9:10 AM

## 2019-01-23 NOTE — Progress Notes (Addendum)
ANTICOAGULATION CONSULT NOTE  Pharmacy Consult for Heparin Indication: pulmonary embolus  No Known Allergies  Patient Measurements: Height: 5\' 6"  (167.6 cm) Weight: 122 lb (55.3 kg) IBW/kg (Calculated) : 59.3 HEPARIN DW (KG): 55.3  Vital Signs: Temp: 99.1 F (37.3 C) (12/03 0200) Temp Source: Oral (12/03 0200) BP: 117/76 (12/03 0400) Pulse Rate: 86 (12/03 0400)  Labs: Recent Labs    01/20/19 0709 01/20/19 0905  01/20/19 1120  01/21/19 0717 01/22/19 0421 01/22/19 1631 01/22/19 2121 01/23/19 0501  HGB  --   --   --   --    < > 8.3* 8.0*  --   --  7.5*  HCT  --   --   --   --   --  25.3* 25.7*  --   --  22.9*  PLT  --   --   --   --   --  351 381  --   --  345  APTT  --  119*  --   --   --   --   --   --   --   --   HEPARINUNFRC  --  0.60  --   --    < > 0.37 0.31  --  0.14* 0.34  CREATININE  --   --    < > 1.33*  --  1.42* 1.16* 1.33*  --   --   TROPONINIHS 2,101*  --   --  2,019*  --   --   --   --   --   --    < > = values in this interval not displayed.   Estimated Creatinine Clearance: 34.9 mL/min (A) (by C-G formula based on SCr of 1.33 mg/dL (H)).  Medical History: Past Medical History:  Diagnosis Date  . Allergic rhinitis   . Aneurysm of unspecified site ([place])   . Cholelithiasis   . Diabetes type 2, controlled ([place])   . GERD (gastroesophageal reflux disease)   . Hypertension   . Leg DVT (deep venous thromboembolism), acute, bilateral ([place]) 01/21/2019  . Multiple subsegmental pulmonary emboli without acute cor pulmonale ([place]) 01/20/2019  . Neuropathy   . Veno-occlusive disease    Medications:  Medications Prior to Admission  Medication Sig Dispense Refill Last Dose  . acetaminophen (TYLENOL) 500 MG tablet Take 500-1,000 mg by mouth every 6 (six) hours as needed for moderate pain.    prn at prn  . albuterol (PROVENTIL HFA;VENTOLIN HFA) 108 (90 BASE) MCG/ACT inhaler Inhale 2 puffs into the lungs every 6 (six) hours as needed for wheezing or shortness of  breath.    prn at prn  . amLODipine (NORVASC) 5 MG tablet Take 1 tablet (5 mg total) by mouth daily. 30 tablet 0 01/19/2019 at 0800  . aspirin EC 81 MG tablet Take 81 mg by mouth daily.   01/19/2019 at 0800  . cetirizine (ZYRTEC) 10 MG tablet Take 10 mg by mouth daily.    01/19/2019 at 0800  . Cholecalciferol (VITAMIN D3) 1000 units CAPS Take 1,000 Units by mouth daily.    01/19/2019 at 0800  . clopidogrel (PLAVIX) 75 MG tablet Take 75 mg by mouth daily.   01/19/2019 at 0800  . ferrous sulfate 325 (65 FE) MG tablet Take 325 mg by mouth 2 (two) times daily with a meal.    01/19/2019 at 0800  . fluticasone (FLONASE) 50 MCG/ACT nasal spray Place 2 sprays into both nostrils daily as needed for allergies or rhinitis.  prn at prn  . gabapentin (NEURONTIN) 800 MG tablet Take 800 mg by mouth 3 (three) times daily.    01/19/2019 at 0800  . hydrochlorothiazide (HYDRODIURIL) 25 MG tablet Take 25 mg by mouth daily.   01/19/2019 at 0800  . insulin aspart (NOVOLOG) 100 UNIT/ML injection Check blood sugar prior to meals and give insulin on the following scale: Blood sugar less than 150: 0 units BS 150-200: 2 units BS 200-250: 3 units BS 250-300: 4 units BS 300-350: 5 units BS 350-400: 6 units BS above 400: 8 units and call your doctor. 10 mL 1 01/19/2019 at Unknown time  . LANTUS SOLOSTAR 100 UNIT/ML Solostar Pen Inject 25 Units into the skin at bedtime. 15 mL 0 Past Week at Unknown time  . lisinopril (ZESTRIL) 10 MG tablet Take 2 tablets (20 mg total) by mouth daily. 30 tablet 0 01/19/2019 at 0800  . metFORMIN (GLUCOPHAGE-XR) 500 MG 24 hr tablet Take 1,000 mg by mouth daily.    01/19/2019 at 0800  . metoprolol tartrate (LOPRESSOR) 25 MG tablet Take 12.5 mg by mouth 2 (two) times daily.   01/19/2019 at 0800  . Multiple Vitamin (MULTI-VITAMINS) TABS Take 1 tablet by mouth daily.   01/19/2019 at 0800  . polyethylene glycol powder (GLYCOLAX/MIRALAX) powder Take 0.5 Containers by mouth daily as needed for  moderate constipation.    prn at prn  . umeclidinium-vilanterol (ANORO ELLIPTA) 62.5-25 MCG/INH AEPB Inhale 1 puff into the lungs daily.   prn at prn  . APIDRA SOLOSTAR 100 UNIT/ML Solostar Pen Inject 0-50 Units into the skin.     Marland Kitchen diclofenac (FLECTOR) 1.3 % PTCH Apply 1 patch topically 2 (two) times daily. Apply 1 patch twice a day.     . mometasone (ELOCON) 0.1 % cream Apply 1 application topically 2 (two) times daily as needed (rash).    Not Taking at Unknown time  . rosuvastatin (CRESTOR) 20 MG tablet Take 20 mg by mouth daily.    Not Taking at Unknown time   Assessment: Pharmacy consulted for heparin dosing for 68 yo female initiated on heparin for bilateral pulmonary embolism and DVT; suspected new diagnosis metastatic disease. Patient with past medical history significant for diabetes, CVA, HTN, COPD, and hyperlipidemia. Pharmacy asked to initiate and monitor Heparin for PE.  Heparin infusing at 1000 units/hr.   Patient underwent mechanical thrombectomy this am. Heparin resumed at 1150 at 1000 units/hr  12/2 @ 2121 HL: 0.14. Infusion increased to 1200 units/hr.    Goal of Therapy:  Heparin level 0.3-0.7 units/ml Monitor platelets by anticoagulation protocol: Yes   Plan:  12/3 @ 0501 HL: 0.34. Level is therapeutic x1 @ infusion rate of 1200 units/hr. Hgb 8.0>> 7.5- continue to monitor.   Plan is for patient have liver biopsy on 12/3. Heparin stopped @ 0400. Will F/U on heparin plan post biopsy. Patient to be discharged on apixaban.   Pharmacy will continue to monitor and adjust per consult.    Pernell Dupre, PharmD, BCPS Clinical Pharmacist 01/23/2019 6:17 AM

## 2019-01-23 NOTE — Progress Notes (Signed)
Pulmonary Medicine          Date: 01/23/2019,   MRN# [id number] Audrey Chan [birth year]     AdmissionWeight: 55.3 kg                 CurrentWeight: 55.3 kg  Referring physician: Dr. Sarajane Jews    CHIEF COMPLAINT:   Bilateral PE and possible malignancy.    SUBJECTIVE    Patient had episode of worsening acute hypoxemia, she was placed on 6Lnc and was only satting 80% ultimately required Non-rebreather at 100% to improve saturation to 93%.  PAST MEDICAL HISTORY   Past Medical History:  Diagnosis Date   Allergic rhinitis    Aneurysm of unspecified site [facility])    Cholelithiasis    Diabetes type 2, controlled ([place])    GERD (gastroesophageal reflux disease)    Hypertension    Leg DVT (deep venous thromboembolism), acute, bilateral ([place]) 01/21/2019   Multiple subsegmental pulmonary emboli without acute cor pulmonale ([place]) 01/20/2019   Neuropathy    Veno-occlusive disease      SURGICAL HISTORY   Past Surgical History:  Procedure Laterality Date   ABDOMINAL HYSTERECTOMY  1972   CERVICAL FUSION     CHOLECYSTECTOMY N/A 12/28/2014   Procedure: LAPAROSCOPIC CHOLECYSTECTOMY;  Surgeon: Sherri Rad, MD;  Location: [facility] ORS;  Service: General;  Laterality: N/A;   EYE SURGERY     detached retina   MASS EXCISION Left 05/06/2018   Procedure: EXCISION OF LEFT ANTECUBITAL CYST, DIABETIC;  Surgeon: Vickie Epley, MD;  Location: [facility] ORS;  Service: Vascular;  Laterality: Left;   PULMONARY THROMBECTOMY Bilateral 01/22/2019   Procedure: PULMONARY THROMBECTOMY;  Surgeon: Algernon Huxley, MD;  Location: [place] [facility];  Service: Cardiovascular;  Laterality: Bilateral;     FAMILY HISTORY   Family History  Problem Relation Age of Onset   Lung cancer Father      SOCIAL HISTORY   Social History   Tobacco Use   Smoking status: Former Smoker    Quit date: 02/21/1999    Years since quitting: 19.9   Smokeless tobacco: Never Used  Substance Use  Topics   Alcohol use: No    Alcohol/week: 0.0 standard drinks   Drug use: No     MEDICATIONS    Home Medication:    Current Medication:  Current Facility-Administered Medications:    0.9 %  sodium chloride infusion (Manually program via Guardrails IV Fluids), 250 mL, Intravenous, Once, Charlaine Dalton R, MD   acetaminophen (TYLENOL) tablet 650 mg, 650 mg, Oral, Q6H PRN **OR** acetaminophen (TYLENOL) suppository 650 mg, 650 mg, Rectal, Q6H PRN, Dew, Erskine Squibb, MD   acetaminophen (TYLENOL) tablet 650 mg, 650 mg, Oral, Once, [name], [name], MD   albuterol (VENTOLIN HFA) 108 (90 Base) MCG/ACT inhaler 2 puff, 2 puff, Inhalation, Q6H PRN, Dew, Erskine Squibb, MD   amLODipine (NORVASC) tablet 5 mg, 5 mg, Oral, Daily, Dew, Erskine Squibb, MD, 5 mg at 01/22/19 1515   Chlorhexidine Gluconate Cloth 2 % PADS 6 each, 6 each, Topical, Daily, Dew, Erskine Squibb, MD, 6 each at 01/21/19 1743   cholecalciferol (VITAMIN D) tablet 1,000 Units, 1,000 Units, Oral, Daily, Dew, Erskine Squibb, MD, 1,000 Units at 01/22/19 1515   ferrous sulfate tablet 325 mg, 325 mg, Oral, BID WC, Dew, Erskine Squibb, MD, 325 mg at 01/22/19 1818   fluticasone (FLONASE) 50 MCG/ACT nasal spray 2 spray, 2 spray, Each Nare, Daily PRN, Lucky Cowboy, Erskine Squibb, MD   gabapentin (NEURONTIN)  capsule 600 mg, 600 mg, Oral, TID, Dew, Erskine Squibb, MD, 600 mg at 01/22/19 2241   heparin lock flush 100 unit/mL, 500 Units, Intracatheter, Daily PRN, Charlaine Dalton R, MD   heparin lock flush 100 unit/mL, 250 Units, Intracatheter, PRN, Charlaine Dalton R, MD   HYDROmorphone (DILAUDID) injection 1 mg, 1 mg, Intravenous, Once PRN, Lucky Cowboy, Erskine Squibb, MD   insulin aspart (novoLOG) injection 0-9 Units, 0-9 Units, Subcutaneous, TID WC, Algernon Huxley, MD, 3 Units at 01/22/19 1819   lactated ringers infusion, , Intravenous, Continuous, Ottie Glazier, MD, Last Rate: 125 mL/hr at 01/23/19 0600   loratadine (CLARITIN) tablet 10 mg, 10 mg, Oral, Daily, Dew, Erskine Squibb, MD, 10 mg  at 01/22/19 1515   metoprolol tartrate (LOPRESSOR) tablet 12.5 mg, 12.5 mg, Oral, BID, Dew, Jason S, MD, 12.5 mg at 01/22/19 2241   multivitamin with minerals tablet 1 tablet, 1 tablet, Oral, Daily, Dew, Erskine Squibb, MD, 1 tablet at 01/22/19 1514   ondansetron (ZOFRAN) injection 4 mg, 4 mg, Intravenous, Q6H PRN, Dew, Erskine Squibb, MD   polyethylene glycol (MIRALAX / GLYCOLAX) packet 17 g, 1 packet, Oral, Daily PRN, Lucky Cowboy, Erskine Squibb, MD   rosuvastatin (CRESTOR) tablet 20 mg, 20 mg, Oral, Daily, Dew, Erskine Squibb, MD, 20 mg at 01/22/19 1515   sodium chloride flush (NS) 0.9 % injection 10 mL, 10 mL, Intracatheter, PRN, Charlaine Dalton R, MD   sodium chloride flush (NS) 0.9 % injection 3 mL, 3 mL, Intracatheter, PRN, Rogue Bussing, Elisha Headland, MD   umeclidinium-vilanterol (ANORO ELLIPTA) 62.5-25 MCG/INH 1 puff, 1 puff, Inhalation, Daily, Dew, Erskine Squibb, MD, 1 puff at 01/21/19 0804    ALLERGIES   Patient has no known allergies.     REVIEW OF SYSTEMS    Review of Systems:  Gen:  Denies  fever, sweats, chills weigh loss  HEENT: Denies blurred vision, double vision, ear pain, eye pain, hearing loss, nose bleeds, sore throat Cardiac:  No dizziness, chest pain or heaviness, chest tightness,edema Resp:   Denies cough or sputum porduction, shortness of breath,wheezing, hemoptysis,  Gi: Denies swallowing difficulty, stomach pain, nausea or vomiting, diarrhea, constipation, bowel incontinence Gu:  Denies bladder incontinence, burning urine Ext:   Denies Joint pain, stiffness or swelling Skin: Denies  skin rash, easy bruising or bleeding or hives Endoc:  Denies polyuria, polydipsia , polyphagia or weight change Psych:   Denies depression, insomnia or hallucinations   Other:  All other systems negative   VS: BP 129/71    Pulse 83    Temp 99.1 F (37.3 C) (Oral)    Resp 14    Ht 5\' 6"  (1.676 m)    Wt 55.3 kg    SpO2 98%    BMI 19.69 kg/m      PHYSICAL EXAM    GENERAL:NAD, no fevers, chills, no  weakness no fatigue HEAD: Normocephalic, atraumatic.  EYES: Pupils equal, round, reactive to light. Extraocular muscles intact. No scleral icterus.  MOUTH: Moist mucosal membrane. Dentition intact. No abscess noted.  EAR, NOSE, THROAT: Clear without exudates. No external lesions.  NECK: Supple. No thyromegaly. No nodules. No JVD.  PULMONARY: CTA bialterally.  CARDIOVASCULAR: S1 and S2. Regular rate and rhythm. No murmurs, rubs, or gallops. No edema. Pedal pulses 2+ bilaterally.  GASTROINTESTINAL: Soft, nontender, nondistended. No masses. Positive bowel sounds. No hepatosplenomegaly.  MUSCULOSKELETAL: No swelling, clubbing, or edema. Range of motion full in all extremities.  NEUROLOGIC: Cranial nerves II through XII are intact. No gross focal neurological deficits. Sensation  intact. Reflexes intact.  SKIN: No ulceration, lesions, rashes, or cyanosis. Skin warm and dry. Turgor intact.  PSYCHIATRIC: Mood, affect within normal limits. The patient is awake, alert and oriented x 3. Insight, judgment intact.       IMAGING    Ct Head Wo Contrast  Result Date: 12/31/2018 CLINICAL DATA:  Seizure EXAM: CT HEAD WITHOUT CONTRAST TECHNIQUE: Contiguous axial images were obtained from the base of the skull through the vertex without intravenous contrast. COMPARISON:  None. FINDINGS: Brain: There is no acute intracranial hemorrhage, mass-effect, or edema. There is no new loss of gray-white differentiation. Confluent areas of hypoattenuation in the supratentorial white matter are nonspecific but likely reflect stable advanced microvascular ischemic changes. Chronic small infarcts of the left cerebellum and thalamus again noted. There is no extra-axial collection. Ventricles are stable in size. Vascular: No hyperdense vessel. Skull: Calvarium is unremarkable. Sinuses/Orbits: Mild paranasal sinus mucosal thickening. Orbits are unremarkable. Other: Mastoid air cells are clear. IMPRESSION: No acute or new findings  since December 29, 2018. Stable chronic findings detailed above. Electronically Signed   By: Macy Mis M.D.   On: 12/31/2018 13:15   Ct Head Wo Contrast  Result Date: 12/29/2018 CLINICAL DATA:  Loss of consciousness EXAM: CT HEAD WITHOUT CONTRAST TECHNIQUE: Contiguous axial images were obtained from the base of the skull through the vertex without intravenous contrast. COMPARISON:  None. FINDINGS: Brain: No evidence of acute territorial infarction, hemorrhage, hydrocephalus,extra-axial collection or mass lesion/mass effect. The ventricles are normal in size and contour. Low-attenuation changes in the deep white matter consistent with small vessel ischemia. Vascular: No hyperdense vessel or unexpected calcification. Skull: The skull is intact. No fracture or focal lesion identified. Sinuses/Orbits: The visualized paranasal sinuses and mastoid air cells are clear. The orbits and globes intact. Other: None IMPRESSION: No acute intracranial abnormality. Findings consistent with chronic small vessel ischemia Electronically Signed   By: Prudencio Pair M.D.   On: 12/29/2018 23:08   Ct Angio Chest Pe W/cm &/or Wo Cm  Result Date: 01/20/2019 CLINICAL DATA:  Suspected covid, elevated LFTs, recent stroke EXAM: CT ANGIOGRAPHY CHEST, abdomen, and pelvis WITH CONTRAST TECHNIQUE: Multidetector CT imaging of the chest, abdomen, and pelvis was performed using the standard protocol during bolus administration of intravenous contrast. Multiplanar CT image reconstructions and MIPs were obtained to evaluate the vascular anatomy. CONTRAST:  66mL OMNIPAQUE IOHEXOL 350 MG/ML SOLN COMPARISON:  CT chest September 13, 2017 FINDINGS: Cardiovascular: There is a optimal opacification of the pulmonary arteries. There is a small nonocclusive thrombus seen within the left main pulmonary artery which extends into the anterior left upper lobe segmental branches and partially occlusive thrombus in the posterior left lower lobe subsegmental  branches. There is also partially occlusive thrombus seen within the posterior right upper lobe segmental branch, series 6, image 37. There is also partially occlusive thrombus seen extending into the right middle lobe subsegmental branches and posterior right lower lobe subsegmental branches. The heart is normal in size. No pericardial effusion or thickening. There is slight straightening of the interventricular septum which could be due to early right ventricular heart strain. There is normal three-vessel brachiocephalic anatomy without proximal stenosis. Scattered atherosclerosis is noted. The thoracic aorta is normal in appearance. Mediastinum/Nodes: No hilar, mediastinal, or axillary adenopathy. Thyroid gland, trachea, and esophagus demonstrate no significant findings. Lungs/Pleura: Small subcentimeter pulmonary nodule are again identified as on prior exam dating back to 2019, for example in the posterior right upper lobe there is a 5 mm pulmonary  nodule, series 8, image 21 and anterior right lower lobe, series 8, image 54 a 4 mm pulmonary nodule. There is however a new small nodular opacity in the posterior left upper lobe, series 8, image 47 measuring 7 mm not seen on prior exam. No large airspace consolidation or pleural effusion is seen. Centrilobular emphysematous changes seen within both lung apices. Musculoskeletal: No chest wall abnormality. No acute or significant osseous findings. Review of the MIP images confirms the above findings. Abdomen/pelvis: Hepatobiliary: There are multiple low-density liver lesions seen throughout which were not seen on the prior exam. There is also heterogeneous enhancement seen throughout the liver parenchyma. Central periportal edema is noted. The portal vein is patent. The patient is status post cholecystectomy. A small amount of perihepatic ascites is noted. Pancreas: Unremarkable. No pancreatic ductal dilatation or surrounding inflammatory changes. Spleen: Normal in  size without focal abnormality. Adrenals/Urinary Tract: Both adrenal glands appear normal. The kidneys and collecting system appear normal without evidence of urinary tract calculus or hydronephrosis. Bladder is unremarkable. Stomach/Bowel: The stomach, small bowel, and colon are normal in appearance. No inflammatory changes, wall thickening, or obstructive findings. Vascular/Lymphatic: There are no enlarged mesenteric, retroperitoneal, or pelvic lymph nodes. Scattered aortic atherosclerotic calcifications are seen without aneurysmal dilatation. Reproductive: The uterus and adnexa are unremarkable. Other: No evidence of abdominal wall mass or hernia. Musculoskeletal: No acute or significant osseous findings. IMPRESSION: 1. Bilateral partially occlusive segmental and subsegmental pulmonary emboli as described above. 2. Findings which could be suggestive of early right ventricular heart strain. 3. Multiple unchanged subcentimeter pulmonary nodules as on prior exam dating back to 2019. 4. New nodular opacity within the posterior left upper lobe measuring 7 mm which is nonspecific, and would recommend attention on short interval follow-up exam. 5. Centrilobular emphysematous changes. 6. Numerous hepatic lesions throughout, likely suggestive of hepatic metastatic disease. 7. Small amount of perihepatic ascites 8. These results were called by telephone at the time of interpretation on 01/20/2019 at 2:07 am to provider [facility] , who verbally acknowledged these results. Electronically Signed   By: Prudencio Pair M.D.   On: 01/20/2019 02:09   Mr Jeri Cos [id number] Contrast  Result Date: 01/01/2019 CLINICAL DATA:  Encephalopathy.  DKA. EXAM: MRI HEAD WITHOUT AND WITH CONTRAST TECHNIQUE: Multiplanar, multiecho pulse sequences of the brain and surrounding structures were obtained without and with intravenous contrast. CONTRAST:  38mL GADAVIST GADOBUTROL 1 MMOL/ML IV SOLN COMPARISON:  CT head 12/31/2018 FINDINGS: Brain: Small  acute infarct left inferior cerebellum. No other acute infarct Chronic infarct left inferior cerebellum. Extensive white matter disease bilaterally. Diffuse subcortical and deep white matter hyperintensity throughout both cerebral hemispheres. Hyperintensity is also noted in the right thalamus. Negative for hemorrhage or mass. Ventricle size normal. Normal enhancement postcontrast administration Vascular: Normal arterial flow voids Skull and upper cervical spine: No focal bony abnormality. Cervical spine fusion hardware. Sinuses/Orbits: Negative Other: None IMPRESSION: 1. Acute small infarct left medial cerebellum. 2. Extensive disease throughout the white matter most compatible with chronic ischemia. Chronic infarct left cerebellum. Electronically Signed   By: Franchot Gallo M.D.   On: 01/01/2019 15:05   Ct Abd/pelvis W/ Iv Contrast  Result Date: 01/20/2019 CLINICAL DATA:  Suspected covid, elevated LFTs, recent stroke EXAM: CT ANGIOGRAPHY CHEST, abdomen, and pelvis WITH CONTRAST TECHNIQUE: Multidetector CT imaging of the chest, abdomen, and pelvis was performed using the standard protocol during bolus administration of intravenous contrast. Multiplanar CT image reconstructions and MIPs were obtained to evaluate the vascular anatomy. CONTRAST:  20mL OMNIPAQUE IOHEXOL 350 MG/ML SOLN COMPARISON:  CT chest September 13, 2017 FINDINGS: Cardiovascular: There is a optimal opacification of the pulmonary arteries. There is a small nonocclusive thrombus seen within the left main pulmonary artery which extends into the anterior left upper lobe segmental branches and partially occlusive thrombus in the posterior left lower lobe subsegmental branches. There is also partially occlusive thrombus seen within the posterior right upper lobe segmental branch, series 6, image 37. There is also partially occlusive thrombus seen extending into the right middle lobe subsegmental branches and posterior right lower lobe subsegmental  branches. The heart is normal in size. No pericardial effusion or thickening. There is slight straightening of the interventricular septum which could be due to early right ventricular heart strain. There is normal three-vessel brachiocephalic anatomy without proximal stenosis. Scattered atherosclerosis is noted. The thoracic aorta is normal in appearance. Mediastinum/Nodes: No hilar, mediastinal, or axillary adenopathy. Thyroid gland, trachea, and esophagus demonstrate no significant findings. Lungs/Pleura: Small subcentimeter pulmonary nodule are again identified as on prior exam dating back to 2019, for example in the posterior right upper lobe there is a 5 mm pulmonary nodule, series 8, image 21 and anterior right lower lobe, series 8, image 54 a 4 mm pulmonary nodule. There is however a new small nodular opacity in the posterior left upper lobe, series 8, image 47 measuring 7 mm not seen on prior exam. No large airspace consolidation or pleural effusion is seen. Centrilobular emphysematous changes seen within both lung apices. Musculoskeletal: No chest wall abnormality. No acute or significant osseous findings. Review of the MIP images confirms the above findings. Abdomen/pelvis: Hepatobiliary: There are multiple low-density liver lesions seen throughout which were not seen on the prior exam. There is also heterogeneous enhancement seen throughout the liver parenchyma. Central periportal edema is noted. The portal vein is patent. The patient is status post cholecystectomy. A small amount of perihepatic ascites is noted. Pancreas: Unremarkable. No pancreatic ductal dilatation or surrounding inflammatory changes. Spleen: Normal in size without focal abnormality. Adrenals/Urinary Tract: Both adrenal glands appear normal. The kidneys and collecting system appear normal without evidence of urinary tract calculus or hydronephrosis. Bladder is unremarkable. Stomach/Bowel: The stomach, small bowel, and colon are normal  in appearance. No inflammatory changes, wall thickening, or obstructive findings. Vascular/Lymphatic: There are no enlarged mesenteric, retroperitoneal, or pelvic lymph nodes. Scattered aortic atherosclerotic calcifications are seen without aneurysmal dilatation. Reproductive: The uterus and adnexa are unremarkable. Other: No evidence of abdominal wall mass or hernia. Musculoskeletal: No acute or significant osseous findings. IMPRESSION: 1. Bilateral partially occlusive segmental and subsegmental pulmonary emboli as described above. 2. Findings which could be suggestive of early right ventricular heart strain. 3. Multiple unchanged subcentimeter pulmonary nodules as on prior exam dating back to 2019. 4. New nodular opacity within the posterior left upper lobe measuring 7 mm which is nonspecific, and would recommend attention on short interval follow-up exam. 5. Centrilobular emphysematous changes. 6. Numerous hepatic lesions throughout, likely suggestive of hepatic metastatic disease. 7. Small amount of perihepatic ascites 8. These results were called by telephone at the time of interpretation on 01/20/2019 at 2:07 am to provider [facility] , who verbally acknowledged these results. Electronically Signed   By: Prudencio Pair M.D.   On: 01/20/2019 02:09   US Venous Img Lower Bilateral (dvt)  Result Date: 01/20/2019 CLINICAL DATA:  History of pulmonary embolism, now with lower extremity pain. Evaluate for DVT. EXAM: BILATERAL LOWER EXTREMITY VENOUS DOPPLER ULTRASOUND TECHNIQUE: Gray-scale sonography with graded  compression, as well as color Doppler and duplex ultrasound were performed to evaluate the lower extremity deep venous systems from the level of the common femoral vein and including the common femoral, femoral, profunda femoral, popliteal and calf veins including the posterior tibial, peroneal and gastrocnemius veins when visible. The superficial great saphenous vein was also interrogated. Spectral  Doppler was utilized to evaluate flow at rest and with distal augmentation maneuvers in the common femoral, femoral and popliteal veins. COMPARISON:  Chest CTA-01/20/2019 FINDINGS: RIGHT LOWER EXTREMITY Common Femoral Vein: No evidence of thrombus. Normal compressibility, respiratory phasicity and response to augmentation. Saphenofemoral Junction: No evidence of thrombus. Normal compressibility and flow on color Doppler imaging. Profunda Femoral Vein: No evidence of thrombus. Normal compressibility and flow on color Doppler imaging. Femoral Vein: No evidence of thrombus. Normal compressibility, respiratory phasicity and response to augmentation. Popliteal Vein: No evidence of thrombus. Normal compressibility, respiratory phasicity and response to augmentation. Calf Veins: There is hypoechoic occlusive thrombus within both paired right peroneal veins (images 58 and 59). Both paired right posterior tibial veins appear patent where imaged. Superficial Great Saphenous Vein: No evidence of thrombus. Normal compressibility. Other Findings:  None. _________________________________________________________ LEFT LOWER EXTREMITY Common Femoral Vein: No evidence of thrombus. Normal compressibility, respiratory phasicity and response to augmentation. Saphenofemoral Junction: No evidence of thrombus. Normal compressibility and flow on color Doppler imaging. Profunda Femoral Vein: No evidence of thrombus. Normal compressibility and flow on color Doppler imaging. Femoral Vein: No evidence of thrombus. Normal compressibility, respiratory phasicity and response to augmentation. Popliteal Vein: No evidence of thrombus. Normal compressibility, respiratory phasicity and response to augmentation. Calf Veins: There is hypoechoic occlusive thrombus within 1 of the paired left peroneal veins (images 29 and 30). The adjacent paired left peroneal vein as well though both paired posterior tibial veins appear patent. Superficial Great Saphenous  Vein: No evidence of thrombus. Normal compressibility. Other Findings:  None. IMPRESSION: Examination is positive for occlusive DVT involving both paired right peroneal veins as well as one of two paired left peroneal veins. There is no extension of this distal tibial DVT to the more proximal venous system of either lower extremity. Electronically Signed   By: [name] Mariscal M.D.   On: 01/20/2019 17:02   Dg Chest Portable 1 View  Result Date: 01/19/2019 CLINICAL DATA:  Shortness of breath EXAM: PORTABLE CHEST 1 VIEW COMPARISON:  12/31/2018 FINDINGS: Cardiomegaly. Lungs clear. No effusions or edema. No acute bony abnormality. IMPRESSION: Cardiomegaly.  No active disease. Electronically Signed   By: Rolm Baptise M.D.   On: 01/19/2019 23:33   Dg Chest Port 1 View  Result Date: 12/31/2018 CLINICAL DATA:  Sepsis EXAM: PORTABLE CHEST 1 VIEW COMPARISON:  December 29, 2018 which is FINDINGS: The heart size and mediastinal contours are unchanged. Again noted is prominence to the central pulmonary vasculature. There is mildly increased interstitial markings seen at both lower lungs slightly more pronounced than the prior exam. No pleural effusion. No acute osseous abnormality. IMPRESSION: Pulmonary vascular congestion and mildly increased interstitial markings at both lower lungs, slightly more pronounced than the prior exam. This could be due to interstitial edema and/or early infectious etiology. Electronically Signed   By: Prudencio Pair M.D.   On: 12/31/2018 03:38   Dg Chest Port 1 View  Result Date: 12/29/2018 CLINICAL DATA:  Altered mental status EXAM: PORTABLE CHEST 1 VIEW COMPARISON:  November 12, 2016 FINDINGS: The heart size and mediastinal contours are within normal limits. There is prominence to the central pulmonary  vasculature. No large airspace consolidation or pleural effusion. IMPRESSION: Findings suggestive pulmonary vascular congestion. Electronically Signed   By: Prudencio Pair M.D.   On: 12/29/2018  21:16     IMPRESSION: 1. Bilateral partially occlusive segmental and subsegmental pulmonary emboli as described above. 2. Findings which could be suggestive of early right ventricular heart strain. 3. Multiple unchanged subcentimeter pulmonary nodules as on prior exam dating back to 2019. 4. New nodular opacity within the posterior left upper lobe measuring 7 mm which is nonspecific, and would recommend attention on short interval follow-up exam. 5. Centrilobular emphysematous changes. 6. Numerous hepatic lesions throughout, likely suggestive of hepatic metastatic disease. 7. Small amount of perihepatic ascites 8. These results were called by telephone at the time of interpretation on 01/20/2019 at 2:07 am to provider [facility] , who verbally acknowledged these results.   Electronically Signed   By: Prudencio Pair M.D.   On: 01/20/2019 02:09    ASSESSMENT/PLAN   Acute hypoxemic respiratory failure secondary to non-massive bilateral partially occlusive pulmonary embolism  -s/p thrombectomy with clinical improvement -CT abdomen with suggestion of liver metastasis - likely etiology of PE is malignancy -Please hold any oral anticoagulants and lovenox until procedures are done -s/p evaluation by oncology - appreciate input -Discussed case with Dr Pascal Lux - plan for Liver bx TTE with-Tricuspid regurgitant velocity 3.62 m/s indicative of pulmonary hypertension   Chronic COPD  -Continue DuoNeb every 6 hours while awake  -Chest physiotherapy with MetaNeb to combat atelectatic segments -Please encourage patient to use incentive spirometer at the bedside at least every hour few times No clinical signs or symptoms of acute exacerbation of COPD    Thank you for allowing me to participate in the care of this patient.   Patient/Family are satisfied with care plan and all questions have been answered.  This document was prepared using Dragon voice recognition software and may  include unintentional dictation errors.     Ottie Glazier, M.D.  Division of [place]

## 2019-01-23 NOTE — Progress Notes (Addendum)
[place] [facility] [place] NAME: Audrey Chan    MR#:  [id number]  DATE OF BIRTH:  10-03-1950  SUBJECTIVE:   Patient earlier had an episode of dry heaves and some vomiting along with shortness of breath. She was placed temporarily on NRB Sats during my evaluation 97% on non-rebreather. Had some back pain.  REVIEW OF SYSTEMS:   Review of Systems  Constitutional: Negative for chills, fever and weight loss.  HENT: Negative for ear discharge, ear pain and nosebleeds.   Eyes: Negative for blurred vision, pain and discharge.  Respiratory: Positive for shortness of breath. Negative for sputum production, wheezing and stridor.   Cardiovascular: Negative for chest pain, palpitations, orthopnea and PND.  Gastrointestinal: Negative for abdominal pain, diarrhea, nausea and vomiting.  Genitourinary: Negative for frequency and urgency.  Musculoskeletal: Negative for back pain and joint pain.  Neurological: Positive for weakness. Negative for sensory change, speech change and focal weakness.  Psychiatric/Behavioral: Negative for depression and hallucinations. The patient is not nervous/anxious.    Tolerating Diet: yes Tolerating PT: no PT needs  DRUG ALLERGIES:  No Known Allergies  VITALS:  Blood pressure 123/74, pulse 91, temperature 98 F (36.7 C), temperature source Oral, resp. rate 18, height 5\' 6"  (1.676 m), weight 55.3 kg, SpO2 99 %.  PHYSICAL EXAMINATION:   Physical Exam  GENERAL:  68 y.o.-year-old patient lying in the bed with mild acute distress.  EYES: Pupils equal, round, reactive to light and accommodation. No scleral icterus. Extraocular muscles intact.  HEENT: Head atraumatic, normocephalic. Oropharynx and nasopharynx clear.  NECK:  Supple, no jugular venous distention. No thyroid enlargement, no tenderness.  LUNGS: decreased breath sounds bilaterally, no wheezing, rales, rhonchi. No use of accessory muscles of respiration.   CARDIOVASCULAR: S1, S2 normal. No murmurs, rubs, or gallops.  ABDOMEN: Soft, nontender, nondistended. Bowel sounds present. No organomegaly or mass.  EXTREMITIES: No cyanosis, clubbing or edema b/l.    NEUROLOGIC: Cranial nerves II through XII are intact. No focal Motor or sensory deficits b/l.   PSYCHIATRIC:  patient is alert and oriented x 3.  SKIN: No obvious rash, lesion, or ulcer.   LABORATORY PANEL:  CBC Recent Labs  Lab 01/23/19 0501  WBC 8.6  HGB 7.5*  HCT 22.9*  PLT 345    Chemistries  Recent Labs  Lab 01/22/19 0421 01/22/19 1631  NA 142 137  K 4.0 4.5  CL 114* 110  CO2 20* 19*  GLUCOSE 44* 221*  BUN 18 17  CREATININE 1.16* 1.33*  CALCIUM 8.4* 8.5*  MG 2.0  --   AST  --  311*  ALT  --  122*  ALKPHOS  --  1,059*  BILITOT  --  0.9   Cardiac Enzymes No results for input(s): TROPONINI in the last 168 hours. RADIOLOGY:  Dg Chest Port 1 View  Result Date: 01/23/2019 CLINICAL DATA:  Hypertension, pulmonary emboli EXAM: PORTABLE CHEST 1 VIEW COMPARISON:  01/20/2019, 01/19/2019 FINDINGS: The heart size and mediastinal contours are stable. No new focal airspace consolidation, pleural effusion, or pneumothorax. Lungs are mildly hyperexpanded. The visualized skeletal structures are unremarkable. IMPRESSION: Stable appearance of the chest without new airspace opacity. Electronically Signed   By: Davina Poke M.D.   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  BNP  876 on admission. --Remains hypoxic, 96% on 4 L, given extensive clot burden with right ventricular dysfunction - vascular surgery Dr. dew  performed bilateral pulmonary thrombectomy on 01/22/2019 -Continue IV heparin drip-- transition to Lovenox tomorrow - Appreciate pulmonology and vascular surgery involvement.  *Bilateral lower extremity DVT. --No significant symptoms.  - Vascular surgery recommends against any intervention. --Continue anticoagulation IV heparin  *multiple liver lesions as noted on CT scan concerning for metastatic disease  -IR will perform biopsy on Monday since patient had taken last dose of Plavix on 11/29 -patient follow-up with Dr. Rogue Bussing thereafter for further workup on her malignancy  *Diabetes mellitus type 2 with peripheral neuropathy, CKD III  -on gabapentin -- continue current insulin regimen.  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 Doy Mince of neurology.  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    Hold lisinopril.   -stable  *COPD, centrilobular emphysema --Appears stable.  Continue bronchodilators  *Iron deficiency anemia appears chronic -patient will benefit from transfusion if her hemoglobin drops beyond 7.5. -She is requiring to have her hematocrit around 25 for liver biopsy   DVT prophylaxis: heparin infusion Code Status: Full Family Communication: none Disposition Plan: home    TOTAL TIME TAKING CARE OF THIS PATIENT: *30 minutes.  >50% time spent on counselling and coordination of care  POSSIBLE D/C IN *1-2* DAYS, DEPENDING ON CLINICAL CONDITION.  Note: This dictation was prepared with Dragon dictation along with smaller phrase technology. Any transcriptional errors that result from this process are unintentional.  [name] M.D on  01/23/2019 at 2:35 PM  Between 7am to 6pm - Pager - [phone]  After 6pm go to [email or web address]  [facility]   CC: Primary care physician; Theotis Burrow, MDPatient ID: Audrey Chan, female   DOB: [birth year], 68 y.o.   MRN: [id number]

## 2019-01-23 NOTE — Telephone Encounter (Signed)
Spoke to patient's sister regarding the inability to do liver biopsy; the possible plan Lovenox injections/for liver biopsy outpatient/on Monday.  Patient also will need PET scan outpatient.

## 2019-01-23 NOTE — Progress Notes (Signed)
[facility] Daily Progress Note   Subjective: 1 Day Post-Op:             1.  Contrast injection right heart             2.  Mechanical thrombectomy left lower lobe and left upper lobe pulmonary arteries and mechanical thrombectomy to the right middle lobe and right upper lobe pulmonary arteries             4.  Selective catheter placement right right upper lobe, right middle lobe, and right lower lobe pulmonary arteries             5.  Selective catheter placement left left lower lobe and left upper lobe pulmonary arteries  Patient with improved breathing / SOB this AM. No lower extremity pain or swelling. No issues overnight.   Objective: Vitals:   01/23/19 0300 01/23/19 0400 01/23/19 0500 01/23/19 0600  BP: 125/76 117/76 134/71 129/71  Pulse: 82 86 81 83  Resp: (!) 8 12 14 14   Temp:      TempSrc:      SpO2: 95% 94% 93% 98%  Weight:      Height:        Intake/Output Summary (Last 24 hours) at 01/23/2019 1017 Last data filed at 01/23/2019 0600 Gross per 24 hour  Intake 2039.39 ml  Output -  Net 2039.39 ml   Physical Exam: A&Ox3, NAD CV: RRR Pulmonary: CTA Bilaterally Abdomen: Soft, Nontender, Nondistended Right Groin:  Access Site: PAD removed. No bleeding noted.   No swelling / ecchymosis noted Vascular:  Lower Extremities: Soft, warm distally to toes, non-tender, minimal edema   Laboratory: CBC    Component Value Date/Time   WBC 8.6 01/23/2019 0501   HGB 7.5 (L) 01/23/2019 0501   HCT 22.9 (L) 01/23/2019 0501   PLT 345 01/23/2019 0501   BMET    Component Value Date/Time   NA 137 01/22/2019 1631   K 4.5 01/22/2019 1631   CL 110 01/22/2019 1631   CO2 19 (L) 01/22/2019 1631   GLUCOSE 221 (H) 01/22/2019 1631   BUN 17 01/22/2019 1631   CREATININE 1.33 (H) 01/22/2019 1631   CALCIUM 8.5 (L) 01/22/2019 1631   GFRNONAA 41 (L) 01/22/2019 1631   GFRAA 47 (L) 01/22/2019 1631     Document Information Photos  s/p pulmonary thrombectomy    01/22/2019 13:10  Attached To:  Hospital Encounter on 01/19/19  Source Information Aeralyn Barna, Janalyn Harder, PA-C  [facility]   Assessment/Planning: The patient is a 68 year old female with multiple medical issues including recent diagnosis of bilateral DVT and PE s/p pulmonary thrombectomy POD#1 1) PE: Successful pulmonary thrombectomy.  Patient with improved shortness of breath.  Start to wean supplemental oxygen. Ambulate.  2) DVT: Continues to be asymptomatic with unremarkable physical exam. No intervention needed. Will follow up in the out patient setting. 3) Anemia: Hbg: 7.5 today - continue to monitor - may need transfusion if continues to trend downward. Asymptomatic at this time. Would recommend Transition to Eliquis 5mg  PO BID tomorrow if CBC stable.   Discussed with Dr. Ellis Parents Andreea Arca PA-C 01/23/2019 10:17 AM

## 2019-01-24 LAB — CBC
HCT: 26.2 % — ABNORMAL LOW (ref 36.0–46.0)
Hemoglobin: 8.6 g/dL — ABNORMAL LOW (ref 12.0–15.0)
MCH: 23.4 pg — ABNORMAL LOW (ref 26.0–34.0)
MCHC: 32.8 g/dL (ref 30.0–36.0)
MCV: 71.2 fL — ABNORMAL LOW (ref 80.0–100.0)
Platelets: 280 10*3/uL (ref 150–400)
RBC: 3.68 MIL/uL — ABNORMAL LOW (ref 3.87–5.11)
RDW: 14.9 % (ref 11.5–15.5)
WBC: 11.8 10*3/uL — ABNORMAL HIGH (ref 4.0–10.5)
nRBC: 0 % (ref 0.0–0.2)

## 2019-01-24 LAB — TYPE AND SCREEN
ABO/RH(D): O POS
Antibody Screen: NEGATIVE
Unit division: 0

## 2019-01-24 LAB — BASIC METABOLIC PANEL
Anion gap: 10 (ref 5–15)
BUN: 20 mg/dL (ref 8–23)
CO2: 17 mmol/L — ABNORMAL LOW (ref 22–32)
Calcium: 8.5 mg/dL — ABNORMAL LOW (ref 8.9–10.3)
Chloride: 109 mmol/L (ref 98–111)
Creatinine, Ser: 1.41 mg/dL — ABNORMAL HIGH (ref 0.44–1.00)
GFR calc Af Amer: 44 mL/min — ABNORMAL LOW (ref >60)
GFR calc non Af Amer: 38 mL/min — ABNORMAL LOW (ref >60)
Glucose, Bld: 296 mg/dL — ABNORMAL HIGH (ref 70–99)
Potassium: 4.4 mmol/L (ref 3.5–5.1)
Sodium: 136 mmol/L (ref 135–145)

## 2019-01-24 LAB — HEPARIN LEVEL (UNFRACTIONATED): Heparin Unfractionated: 0.51 IU/mL (ref 0.30–0.70)

## 2019-01-24 LAB — BPAM RBC
Blood Product Expiration Date: 202012092359
ISSUE DATE / TIME: 202012031239
Unit Type and Rh: 5100

## 2019-01-24 LAB — GLUCOSE, CAPILLARY
Glucose-Capillary: 231 mg/dL — ABNORMAL HIGH (ref 70–99)
Glucose-Capillary: 272 mg/dL — ABNORMAL HIGH (ref 70–99)
Glucose-Capillary: 283 mg/dL — ABNORMAL HIGH (ref 70–99)
Glucose-Capillary: 291 mg/dL — ABNORMAL HIGH (ref 70–99)
Glucose-Capillary: 374 mg/dL — ABNORMAL HIGH (ref 70–99)
Glucose-Capillary: 465 mg/dL — ABNORMAL HIGH (ref 70–99)

## 2019-01-24 MED ORDER — INSULIN ASPART 100 UNIT/ML ~~LOC~~ SOLN: 0.0000 [IU] | Freq: Three times a day (TID) | SUBCUTANEOUS | Status: DC

## 2019-01-24 MED ORDER — ENOXAPARIN SODIUM 60 MG/0.6ML ~~LOC~~ SOLN
55.0000 mg | Freq: Two times a day (BID) | SUBCUTANEOUS | Status: DC
  Administered 2019-01-24 – 2019-01-25 (×3): 55 mg via SUBCUTANEOUS
  Filled 2019-01-24 (×5): qty 0.6

## 2019-01-24 MED ORDER — INSULIN ASPART 100 UNIT/ML ~~LOC~~ SOLN
0.0000 [IU] | Freq: Three times a day (TID) | SUBCUTANEOUS | Status: DC
  Administered 2019-01-24: 17:00:00 15 [IU] via SUBCUTANEOUS
  Administered 2019-01-25: 09:00:00 3 [IU] via SUBCUTANEOUS
  Administered 2019-01-25: 5 [IU] via SUBCUTANEOUS
  Filled 2019-01-24 (×3): qty 1

## 2019-01-24 MED ORDER — LISINOPRIL 10 MG PO TABS
10.0000 mg | ORAL_TABLET | Freq: Every day | ORAL | Status: DC
  Administered 2019-01-24 – 2019-01-25 (×2): 10 mg via ORAL
  Filled 2019-01-24: qty 1
  Filled 2019-01-24: qty 2

## 2019-01-24 MED ORDER — INSULIN ASPART 100 UNIT/ML ~~LOC~~ SOLN: 0.0000 [IU] | Freq: Every day | SUBCUTANEOUS | Status: DC

## 2019-01-24 MED ORDER — INSULIN ASPART 100 UNIT/ML ~~LOC~~ SOLN
3.0000 [IU] | Freq: Three times a day (TID) | SUBCUTANEOUS | Status: DC
  Administered 2019-01-24 – 2019-01-25 (×4): 3 [IU] via SUBCUTANEOUS
  Filled 2019-01-24 (×4): qty 1

## 2019-01-24 MED ORDER — INSULIN GLARGINE 100 UNIT/ML ~~LOC~~ SOLN
10.0000 [IU] | Freq: Every day | SUBCUTANEOUS | Status: DC
  Administered 2019-01-24 – 2019-01-25 (×2): 10 [IU] via SUBCUTANEOUS
  Filled 2019-01-24 (×2): qty 0.1

## 2019-01-24 MED ORDER — INSULIN ASPART 100 UNIT/ML ~~LOC~~ SOLN
0.0000 [IU] | Freq: Every day | SUBCUTANEOUS | Status: DC
  Administered 2019-01-24: 21:00:00 3 [IU] via SUBCUTANEOUS
  Filled 2019-01-24: qty 1

## 2019-01-24 NOTE — TOC Benefit Eligibility Note (Signed)
Transition of Care [facility]) Benefit Eligibility Note    Patient Details  Name: KAMYLA MONCUR MRN: [id number] Date of Birth: 09/03/1950   Medication/Dose: Enoxaparin 55mg  BID for 8 days  Covered?: Yes  Tier: Other(Tier 4)  Prescription Coverage Preferred Pharmacy: Diannia Ruder with Person/Company/Phone Number:: Danae Chen with [employer] at [phone]  Co-Pay: $0.00 estimated copay for Enoxaparin 60mg  BID for 8 days  Prior Approval: No  Deductible: (Patient in Catastrophic phase per rep, however has low income subsidy on plan.)   Dannette Barbara Phone Number: [phone] or [phone] 01/24/2019, 9:45 AM

## 2019-01-24 NOTE — Progress Notes (Signed)
ANTICOAGULATION CONSULT NOTE  Pharmacy Consult for Heparin Indication: pulmonary embolus  No Known Allergies  Patient Measurements: Height: 5\' 6"  (167.6 cm) Weight: 122 lb (55.3 kg) IBW/kg (Calculated) : 59.3 HEPARIN DW (KG): 55.3  Vital Signs: Temp: 98.1 F (36.7 C) (12/04 0200) Temp Source: Oral (12/04 0200) BP: 118/69 (12/04 0200) Pulse Rate: 88 (12/04 0200)  Labs: Recent Labs    01/22/19 0421 01/22/19 1631  01/23/19 0501 01/23/19 2120 01/24/19 0449  HGB 8.0*  --   --  7.5*  --  8.6*  HCT 25.7*  --   --  22.9*  --  26.2*  PLT 381  --   --  345  --  280  HEPARINUNFRC 0.31  --    < > 0.34 0.35 0.51  CREATININE 1.16* 1.33*  --   --   --  1.41*   < > = values in this interval not displayed.   Estimated Creatinine Clearance: 32.9 mL/min (A) (by C-G formula based on SCr of 1.41 mg/dL (H)).  Medical History: Past Medical History:  Diagnosis Date  . Allergic rhinitis   . Aneurysm of unspecified site ([place])   . Cholelithiasis   . Diabetes type 2, controlled ([place])   . GERD (gastroesophageal reflux disease)   . Hypertension   . Leg DVT (deep venous thromboembolism), acute, bilateral ([place]) 01/21/2019  . Multiple subsegmental pulmonary emboli without acute cor pulmonale ([place]) 01/20/2019  . Neuropathy   . Veno-occlusive disease    Medications:  Medications Prior to Admission  Medication Sig Dispense Refill Last Dose  . acetaminophen (TYLENOL) 500 MG tablet Take 500-1,000 mg by mouth every 6 (six) hours as needed for moderate pain.    prn at prn  . albuterol (PROVENTIL HFA;VENTOLIN HFA) 108 (90 BASE) MCG/ACT inhaler Inhale 2 puffs into the lungs every 6 (six) hours as needed for wheezing or shortness of breath.    prn at prn  . amLODipine (NORVASC) 5 MG tablet Take 1 tablet (5 mg total) by mouth daily. 30 tablet 0 01/19/2019 at 0800  . aspirin EC 81 MG tablet Take 81 mg by mouth daily.   01/19/2019 at 0800  . cetirizine (ZYRTEC) 10 MG tablet Take 10 mg by mouth daily.     01/19/2019 at 0800  . Cholecalciferol (VITAMIN D3) 1000 units CAPS Take 1,000 Units by mouth daily.    01/19/2019 at 0800  . clopidogrel (PLAVIX) 75 MG tablet Take 75 mg by mouth daily.   01/19/2019 at 0800  . ferrous sulfate 325 (65 FE) MG tablet Take 325 mg by mouth 2 (two) times daily with a meal.    01/19/2019 at 0800  . fluticasone (FLONASE) 50 MCG/ACT nasal spray Place 2 sprays into both nostrils daily as needed for allergies or rhinitis.    prn at prn  . gabapentin (NEURONTIN) 800 MG tablet Take 800 mg by mouth 3 (three) times daily.    01/19/2019 at 0800  . hydrochlorothiazide (HYDRODIURIL) 25 MG tablet Take 25 mg by mouth daily.   01/19/2019 at 0800  . insulin aspart (NOVOLOG) 100 UNIT/ML injection Check blood sugar prior to meals and give insulin on the following scale: Blood sugar less than 150: 0 units BS 150-200: 2 units BS 200-250: 3 units BS 250-300: 4 units BS 300-350: 5 units BS 350-400: 6 units BS above 400: 8 units and call your doctor. 10 mL 1 01/19/2019 at Unknown time  . LANTUS SOLOSTAR 100 UNIT/ML Solostar Pen Inject 25 Units into the skin  at bedtime. 15 mL 0 Past Week at Unknown time  . lisinopril (ZESTRIL) 10 MG tablet Take 2 tablets (20 mg total) by mouth daily. 30 tablet 0 01/19/2019 at 0800  . metFORMIN (GLUCOPHAGE-XR) 500 MG 24 hr tablet Take 1,000 mg by mouth daily.    01/19/2019 at 0800  . metoprolol tartrate (LOPRESSOR) 25 MG tablet Take 12.5 mg by mouth 2 (two) times daily.   01/19/2019 at 0800  . Multiple Vitamin (MULTI-VITAMINS) TABS Take 1 tablet by mouth daily.   01/19/2019 at 0800  . polyethylene glycol powder (GLYCOLAX/MIRALAX) powder Take 0.5 Containers by mouth daily as needed for moderate constipation.    prn at prn  . umeclidinium-vilanterol (ANORO ELLIPTA) 62.5-25 MCG/INH AEPB Inhale 1 puff into the lungs daily.   prn at prn  . APIDRA SOLOSTAR 100 UNIT/ML Solostar Pen Inject 0-50 Units into the skin.     Marland Kitchen diclofenac (FLECTOR) 1.3 % PTCH Apply 1  patch topically 2 (two) times daily. Apply 1 patch twice a day.     . mometasone (ELOCON) 0.1 % cream Apply 1 application topically 2 (two) times daily as needed (rash).    Not Taking at Unknown time  . rosuvastatin (CRESTOR) 20 MG tablet Take 20 mg by mouth daily.    Not Taking at Unknown time   Assessment: Pharmacy consulted for heparin dosing for 68 yo female initiated on heparin for bilateral pulmonary embolism and DVT; suspected new diagnosis metastatic disease. Patient with past medical history significant for diabetes, CVA, HTN, COPD, and hyperlipidemia. Pharmacy asked to initiate and monitor Heparin for PE.  Heparin infusing at 1200 units/hr.   12/3 Heparin restarted @ 1200 units/hrs 12/3 @ 2120 HL: 0.35. Level is therapeutic x1.    Goal of Therapy:  Heparin level 0.3-0.7 units/ml Monitor platelets by anticoagulation protocol: Yes   Plan:  12/4 @ 0449 HL: 0.51. Level is therapeutic x2.  Will continue current infusion rate of  1200 units/hr  Will check confirmatory  anti-Xa level and CBC with am labs.   Patient to be discharged on apixaban.   Pharmacy will continue to monitor and adjust per consult.    Pernell Dupre, PharmD, BCPS Clinical Pharmacist 01/24/2019 6:25 AM

## 2019-01-24 NOTE — Evaluation (Signed)
Physical Therapy Evaluation Patient Details Name: Audrey Chan MRN: [id number] DOB: 26-Jun-1950 Today's Date: 01/24/2019   History of Present Illness  Audrey Chan is a 69yoF who [facility] 11/29 Chan 4-5dSOB, admited for multiple + PE & B LE DVT. Pt is now s/p mechanicial thrombectomy on 12/2. PMH includes aneurysm, GERD, HTN, CVA, and DM. Pt with + hepatic lesions that are suspicious of metastatic dx and is pending liver biopsy.Pt evauated by PT on 12/2 and determined to be at baseline, PT signed off, then next day had an episode of emesis thought to be a vagal episode. Pt contiunes to have supplemental O2 needs.  Clinical Impression  Pt admitted with above diagnosis. Pt currently with functional limitations due to the deficits listed below (see "PT Problem List"). Upon entry, pt in bed, asleep, and agreeable to participate. Pt remains lethargic throughout, slow to respond to questioning, sometimes not responding at all. Pt recently weaned to 2L per RN, but then requires 3L for bed mobility. After AMB 121ft and desaturation to 84%, pt continues to AMB at 4L/min but gait speed is dramatically arrested by DOE, HR remaining in 110s Chan DOE even after saturations are improved. Functional mobility assessment demonstrates increased effort/time requirements, poor tolerance, and need for physical assistance, whereas the patient performed these at a higher level of independence PTA. Unclear if cognition issues are related to medication, somnolence, or other. Pt will benefit from skilled PT intervention to increase independence and safety with basic mobility in preparation for discharge to the venue listed below.    Activity HR SpO2 Flow rate  Pt response   Resting in bed   98% 2L/min resting comfortable  AMB 150'  84% 3L/min   AMB 300'  115bpm 88% 4L/min tachypnea, mild anxiety   back in bed  98bpm 91% 3L/min asleep        Follow Up Recommendations Home health PT;Supervision for mobility/OOB     Equipment Recommendations  None recommended by PT    Recommendations for Other Services       Precautions / Restrictions Precautions Precautions: Fall Restrictions Weight Bearing Restrictions: No      Mobility  Bed Mobility Overal bed mobility: Modified Independent                Transfers Overall transfer level: Modified independent Equipment used: None             General transfer comment: heavy verbcal cues to minimize impulsivity in the setting of multiple lines/leads.  Ambulation/Gait Ambulation/Gait assistance: Supervision Gait Distance (Feet): 350 Feet Assistive device: None Gait Pattern/deviations: WFL(Within Functional Limits) Gait velocity: 0.87m/s   General Gait Details: AMB quickly, almost impulsively, but without LOB, although appears minimally unsteady at times. desats on 3L after 169ft, then high 80s% on 4L thereafter.  Stairs            Wheelchair Mobility    Modified Rankin (Stroke Patients Only)       Balance Overall balance assessment: Needs assistance   Sitting balance-Leahy Scale: Good     Standing balance support: No upper extremity supported;During functional activity Standing balance-Leahy Scale: Good                               Pertinent Vitals/Pain Pain Assessment: No/denies pain    Home Living Family/patient expects to be discharged to:: Private residence Living Arrangements: Other relatives(Sister) Available Help at Discharge: Family;Available 24 hours/day Type of  Home: House Home Access: Ramped entrance Entrance Stairs-Rails: Right;Left;Can reach both Entrance Stairs-Number of Steps: 4 Home Layout: One level Home Equipment: Walker - 2 wheels      Prior Function Level of Independence: Independent         Comments: reports she has been indep with all mobility     Hand Dominance        Extremity/Trunk Assessment   Upper Extremity Assessment Upper Extremity Assessment: Overall  WFL for tasks assessed    Lower Extremity Assessment Lower Extremity Assessment: Overall WFL for tasks assessed    Cervical / Trunk Assessment Cervical / Trunk Assessment: Normal  Communication   Communication: No difficulties  Cognition Arousal/Alertness: Lethargic Behavior During Therapy: Flat affect Overall Cognitive Status: No family/caregiver present to determine baseline cognitive functioning                                 General Comments: Not very descirptive of complaints, difficult to obtain full report from patient.      General Comments      Exercises     Assessment/Plan    PT Assessment Patient needs continued PT services  PT Problem List Cardiopulmonary status limiting activity;Decreased mobility;Decreased activity tolerance;Decreased strength;Decreased cognition       PT Treatment Interventions DME instruction;Gait training;Stair training;Functional mobility training;Therapeutic activities;Therapeutic exercise;Balance training;Neuromuscular re-education;Cognitive remediation;Patient/family education    PT Goals (Current goals can be found in the Care Plan section)  Acute Rehab PT Goals Patient Stated Goal: to return home PT Goal Formulation: With patient Time For Goal Achievement: 02/05/19 Potential to Achieve Goals: Good    Frequency Min 2X/week   Barriers to discharge        Co-evaluation               AM-PAC PT "6 Clicks" Mobility  Outcome Measure Help needed turning from your back to your side while in a flat bed without using bedrails?: A Little Help needed moving from lying on your back to sitting on the side of a flat bed without using bedrails?: A Little Help needed moving to and from a bed to a chair (including a wheelchair)?: A Little Help needed standing up from a chair using your arms (e.g., wheelchair or bedside chair)?: A Little Help needed to walk in hospital room?: A Little Help needed climbing 3-5 steps with  a railing? : A Little 6 Click Score: 18    End of Session Equipment Utilized During Treatment: Oxygen Activity Tolerance: Patient limited by lethargy;Treatment limited secondary to medical complications (Comment) Patient left: in bed Nurse Communication: Mobility status PT Visit Diagnosis: Muscle weakness (generalized) (M62.81);Other symptoms and signs involving the nervous system (R29.898);Difficulty in walking, not elsewhere classified (R26.2)    Time: [id number] PT Time Calculation (min) (ACUTE ONLY): 26 min   Charges:   PT Evaluation $PT Eval High Complexity: 1 High PT Treatments $Therapeutic Exercise: 8-22 mins       4:12 PM, 01/24/19 Etta Grandchild, PT, DPT Physical Therapist - [facility]  [phone] ([place])   Audrey Chan 01/24/2019, 3:59 PM

## 2019-01-24 NOTE — Discharge Instructions (Addendum)
Vascular Surgery Discharge Instructions: 1) You may shower. Keep groin clean and dry.   Your liver biopsy is scheduled with interventional radiology at [facility] on 12/7/ 2020 at 8:30 am.  You need to arrive at 8 o'clock on January 27, 2019 in the admissions area and tell them you have liver biopsy scheduled. NPO after midnight on sunday11;59 pm take you Lovenox shot Sunday, December 6 in the morning. Thereafter hold it till further instructions given. Do not take your aspirin or Plavix till further instructions

## 2019-01-24 NOTE — Evaluation (Signed)
Occupational Therapy Evaluation Patient Details Name: Audrey Chan MRN: [id number] DOB: April 25, 1950 Today's Date: 01/24/2019    History of Present Illness Audrey Chan is a 37yoF who come sto [facility] 11/29 c 4-5dSOB, admited for multiple + PE & B LE DVT. Pt is now s/p mechanicial thrombectomy on 12/2. PMH includes aneurysm, GERD, HTN, CVA, and DM. Pt with + hepatic lesions that are suspicious of metastatic dx and is pending liver biopsy.Pt evauated by PT on 12/2 and determined to be at baseline, PT signed off, then next day had an episode of emesis thought to be a vagal episode. Pt contiunes to have supplemental O2 needs.   Clinical Impression   Pt seen for OT evaluation this date. Prior to hospital admission, pt reports being Indep with ADLs/IADLs, however, pt is questionable historian.  Pt reports living with sister in home with ramped entrance.  Currently pt demonstrates impairments in fxl activity tolerance requiring SBA and VS monitoring with standing ADLs and MIN A with some more taxing ADLs such as bathing/dressing.  Pt would benefit from skilled OT to address noted impairments and functional limitations (see below for any additional details) in order to maximize safety and independence while minimizing falls risk and caregiver burden.  Upon hospital discharge, recommend pt discharge to home with [facility] and 24/7 monitoring for safety.    Follow Up Recommendations  Home health OT;Supervision/Assistance - 24 hour    Equipment Recommendations  3 in 1 bedside commode    Recommendations for Other Services       Precautions / Restrictions Precautions Precautions: Fall Restrictions Weight Bearing Restrictions: No Other Position/Activity Restrictions: monitor spO2, HR, RR      Mobility Bed Mobility Overal bed mobility: Modified Independent             General bed mobility comments: with OT managing lines  Transfers Overall transfer level: Modified independent Equipment  used: None             General transfer comment: verbal cues for safety and SBA for line mgt and VS monitoring with standing.    Balance Overall balance assessment: Needs assistance   Sitting balance-Leahy Scale: Good     Standing balance support: No upper extremity supported;During functional activity Standing balance-Leahy Scale: Good                             ADL either performed or assessed with clinical judgement   ADL Overall ADL's : Needs assistance/impaired Eating/Feeding: Set up;Sitting   Grooming: Wash/dry hands;Wash/dry face;Oral care;Set up;Sitting   Upper Body Bathing: Set up;Sitting   Lower Body Bathing: Min guard;Minimal assistance;Sit to/from stand Lower Body Bathing Details (indicate cue type and reason): bsed on clinical observation Upper Body Dressing : Set up;Sitting       Toilet Transfer: Min guard;Minimal assistance;BSC Toilet Transfer Details (indicate cue type and reason): MIN A mostly for line mgt, but also for monitoring VS with activity Toileting- Clothing Manipulation and Hygiene: Minimal assistance;Sit to/from Nurse, children's Details (indicate cue type and reason): NA Functional mobility during ADLs: (not assessed at this time, pt fatigued from completing two laps with PT earlier this date.)       Vision Patient Visual Report: No change from baseline Additional Comments: difficult to formally assess as pt appears confused this date and with only MIN/MOD eye opening throughout session     Perception     Praxis  Pertinent Vitals/Pain Pain Assessment: No/denies pain     Hand Dominance     Extremity/Trunk Assessment Upper Extremity Assessment Upper Extremity Assessment: Overall WFL for tasks assessed   Lower Extremity Assessment Lower Extremity Assessment: Overall WFL for tasks assessed   Cervical / Trunk Assessment Cervical / Trunk Assessment: Normal   Communication  Communication Communication: No difficulties   Cognition Arousal/Alertness: Lethargic Behavior During Therapy: Flat affect Overall Cognitive Status: No family/caregiver present to determine baseline cognitive functioning                                 General Comments: Pt with only moderate eye opening/wakefullness throughout assessment. Pt requires MOD verbal/tactile cues to sequence tasks. Pt states she is at "[employer]" hospital when asked location, does not know which city she is in, she does not state anything when asked the month and given several cues.   General Comments       Exercises Other Exercises Other Exercises: OT attempts to facilitate education with pt re: role of OT in acute setting as well as offer safety considerations. Pt with minimal reception of education at this time.   Shoulder Instructions      Home Living Family/patient expects to be discharged to:: Private residence Living Arrangements: Other relatives(sister) Available Help at Discharge: Family;Available 24 hours/day Type of Home: House Home Access: Ramped entrance Entrance Stairs-Number of Steps: 4 Entrance Stairs-Rails: Right;Left;Can reach both Home Layout: One level               Home Equipment: Walker - 2 wheels          Prior Functioning/Environment Level of Independence: Independent        Comments: reports she has been indep with all mobility and all ADLs/IADLs at baseline        OT Problem List: Decreased strength;Decreased activity tolerance;Cardiopulmonary status limiting activity      OT Treatment/Interventions: Self-care/ADL training;Therapeutic exercise;Energy conservation;Therapeutic activities;Patient/family education;Balance training    OT Goals(Current goals can be found in the care plan section) Acute Rehab OT Goals Patient Stated Goal: to return home OT Goal Formulation: With patient Time For Goal Achievement: 02/07/19 Potential to Achieve  Goals: Good  OT Frequency: Min 1X/week   Barriers to D/C:            Co-evaluation              AM-PAC OT "6 Clicks" Daily Activity     Outcome Measure Help from another person eating meals?: None Help from another person taking care of personal grooming?: A Little Help from another person toileting, which includes using toliet, bedpan, or urinal?: A Little Help from another person bathing (including washing, rinsing, drying)?: A Little Help from another person to put on and taking off regular upper body clothing?: None Help from another person to put on and taking off regular lower body clothing?: A Little 6 Click Score: 20   End of Session Equipment Utilized During Treatment: Gait belt Nurse Communication: Mobility status  Activity Tolerance: Patient tolerated treatment well;Treatment limited secondary to medical complications (Comment)(some increased BP and increased RR with static standing. Antiicpate greater variation with standing fxl activity.) Patient left: in bed;with call bell/phone within reach;with bed alarm set  OT Visit Diagnosis: Unsteadiness on feet (R26.81);Muscle weakness (generalized) (M62.81)                Time: [id number] OT Time Calculation (min): 38 min  Charges:  OT General Charges $OT Visit: 1 Visit OT Evaluation $OT Eval Moderate Complexity: 1 Mod OT Treatments $Self Care/Home Management : 8-22 mins $Therapeutic Activity: 8-22 mins  Gerrianne Scale, MS, OTR/L ascom [phone] 01/24/19, 5:31 PM

## 2019-01-24 NOTE — Progress Notes (Signed)
[place] at [place] NAME: Audrey Chan    MR#:  [id number]  DATE OF BIRTH:  1951/01/13  SUBJECTIVE:  feels a lot better. Earlier was admitted disoriented according to the nurse. She answered all questions appropriately. Sister [name] in the room Sats during my evaluation 97% on 3 L nasal cannula  REVIEW OF SYSTEMS:   Review of Systems  Constitutional: Negative for chills, fever and weight loss.  HENT: Negative for ear discharge, ear pain and nosebleeds.   Eyes: Negative for blurred vision, pain and discharge.  Respiratory: Positive for shortness of breath. Negative for sputum production, wheezing and stridor.   Cardiovascular: Negative for chest pain, palpitations, orthopnea and PND.  Gastrointestinal: Negative for abdominal pain, diarrhea, nausea and vomiting.  Genitourinary: Negative for frequency and urgency.  Musculoskeletal: Negative for back pain and joint pain.  Neurological: Positive for weakness. Negative for sensory change, speech change and focal weakness.  Psychiatric/Behavioral: Negative for depression and hallucinations. The patient is not nervous/anxious.    Tolerating Diet: yes Tolerating PT: no PT needs  DRUG ALLERGIES:  No Known Allergies  VITALS:  Blood pressure (!) 142/71, pulse (!) 102, temperature 97.9 F (36.6 C), temperature source Oral, resp. rate 15, height 5\' 6"  (1.676 m), weight 55.3 kg, SpO2 97 %.  PHYSICAL EXAMINATION:   Physical Exam  GENERAL:  68 y.o.-year-old patient lying in the bed with mild acute distress.  EYES: Pupils equal, round, reactive to light and accommodation. No scleral icterus. Extraocular muscles intact.  HEENT: Head atraumatic, normocephalic. Oropharynx and nasopharynx clear.  NECK:  Supple, no jugular venous distention. No thyroid enlargement, no tenderness.  LUNGS: decreased breath sounds bilaterally, no wheezing, rales, rhonchi. No use of accessory muscles of respiration.   CARDIOVASCULAR: S1, S2 normal. No murmurs, rubs, or gallops.  ABDOMEN: Soft, nontender, nondistended. Bowel sounds present. No organomegaly or mass.  EXTREMITIES: No cyanosis, clubbing or edema b/l.    NEUROLOGIC: Cranial nerves II through XII are intact. No focal Motor or sensory deficits b/l.   PSYCHIATRIC:  patient is alert and oriented x 3.  SKIN: No obvious rash, lesion, or ulcer.   LABORATORY PANEL:  CBC Recent Labs  Lab 01/24/19 0449  WBC 11.8*  HGB 8.6*  HCT 26.2*  PLT 280    Chemistries  Recent Labs  Lab 01/22/19 0421 01/22/19 1631 01/24/19 0449  NA 142 137 136  K 4.0 4.5 4.4  CL 114* 110 109  CO2 20* 19* 17*  GLUCOSE 44* 221* 296*  BUN 18 17 20   CREATININE 1.16* 1.33* 1.41*  CALCIUM 8.4* 8.5* 8.5*  MG 2.0  --   --   AST  --  311*  --   ALT  --  122*  --   ALKPHOS  --  1,059*  --   BILITOT  --  0.9  --    Cardiac Enzymes No results for input(s): TROPONINI in the last 168 hours. RADIOLOGY:  Dg Chest Port 1 View  Result Date: 01/23/2019 CLINICAL DATA:  Hypertension, pulmonary emboli EXAM: PORTABLE CHEST 1 VIEW COMPARISON:  01/20/2019, 01/19/2019 FINDINGS: The heart size and mediastinal contours are stable. No new focal airspace consolidation, pleural effusion, or pneumothorax. Lungs are mildly hyperexpanded. The visualized skeletal structures are unremarkable. IMPRESSION: Stable appearance of the chest without new airspace opacity. Electronically Signed   By: Davina Poke M.D.   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  BNP 876 on admission. --Remains hypoxic, 96% on 4 L, given extensive clot burden with right ventricular dysfunction - vascular surgery Dr. dew  performed bilateral pulmonary thrombectomy on 01/22/2019 -Continue IV heparin drip-- transition to Lovenox today - Appreciate pulmonology and vascular surgery involvement.  *Bilateral lower extremity DVT. --No significant symptoms.  - Vascular surgery recommends against any intervention. --Continue anticoagulation SQ Lovenox  *multiple liver lesions as noted on CT scan concerning for metastatic disease  -IR will perform biopsy on Monday since patient had taken last dose of Plavix on 11/29-- sister [name] informed -patient follow-up with Dr. 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 Doy Mince of neurology.  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  Continue bronchodilators -should will be assess for home oxygen need  *Iron deficiency anemia appears chronic -hemoglobin 8.6 hematocrit 26.2  DVT prophylaxis: subcu Lovenox Code Status: Full Family Communication:  sister [name] Disposition Plan: home   Anticipate discharge tomorrow if continues to show improvement  TOTAL TIME TAKING CARE OF THIS PATIENT: *30 minutes.  >50% time spent on counselling and coordination of care  POSSIBLE D/C IN *1-2* DAYS, DEPENDING ON CLINICAL CONDITION.  Note: This dictation was prepared with Dragon dictation along  with smaller phrase technology. Any transcriptional errors that result from this process are unintentional.  [name] M.D on 01/24/2019 at 2:52 PM  Between 7am to 6pm - Pager - [phone]  After 6pm go to [email or web address]  [facility]   CC: Primary care physician; Theotis Burrow, MDPatient ID: [name], female   DOB: 1950-03-26, 68 y.o.   MRN: [id number]

## 2019-01-24 NOTE — Progress Notes (Signed)
Audrey Chan   DOB:May 21, 1950   [id number]    Subjective: Patient had PRBC transfusion yesterday; transfusion was uneventful.  As per pulmonary, yesterday patient desaturated significantly off oxygen.  Patient continues to be in the ICU   Patient resting comfortably in bed.  She is currently on 4 L of oxygen saturation 99%.  No chest pain or hemoptysis.    Objective:  Vitals:   01/24/19 1400 01/24/19 1415  BP:    Pulse: 93 (!) 102  Resp: 14 15  Temp:    SpO2: 100% 97%     Intake/Output Summary (Last 24 hours) at 01/24/2019 1456 Last data filed at 01/24/2019 1148 Gross per 24 hour  Intake 3154.21 ml  Output 725 ml  Net 2429.21 ml    Physical Exam  Constitutional: She is oriented to person, place, and time and well-developed, well-nourished, and in no distress.  HENT:  Head: Normocephalic and atraumatic.  Mouth/Throat: Oropharynx is clear and moist. No oropharyngeal exudate.  Eyes: Pupils are equal, round, and reactive to light.  Neck: Normal range of motion. Neck supple.  Cardiovascular: Normal rate and regular rhythm.  Pulmonary/Chest: No respiratory distress. She has no wheezes.  Decreased air entry bilaterally.  Abdominal: Soft. Bowel sounds are normal. She exhibits no distension and no mass. There is no abdominal tenderness. There is no rebound and no guarding.  Musculoskeletal: Normal range of motion.        General: No tenderness or edema.  Neurological: She is alert and oriented to person, place, and time.  Skin: Skin is warm.  Psychiatric: Affect normal.    Labs:  Lab Results  Component Value Date   WBC 11.8 (H) 01/24/2019   HGB 8.6 (L) 01/24/2019   HCT 26.2 (L) 01/24/2019   MCV 71.2 (L) 01/24/2019   PLT 280 01/24/2019   NEUTROABS 7.6 01/19/2019    Lab Results  Component Value Date   NA 136 01/24/2019   K 4.4 01/24/2019   CL 109 01/24/2019   CO2 17 (L) 01/24/2019    Studies:  Dg Chest Port 1 View  Result Date: 01/23/2019 CLINICAL DATA:   Hypertension, pulmonary emboli EXAM: PORTABLE CHEST 1 VIEW COMPARISON:  01/20/2019, 01/19/2019 FINDINGS: The heart size and mediastinal contours are stable. No new focal airspace consolidation, pleural effusion, or pneumothorax. Lungs are mildly hyperexpanded. The visualized skeletal structures are unremarkable. IMPRESSION: Stable appearance of the chest without new airspace opacity. Electronically Signed   By: Davina Poke M.D.   On: 01/23/2019 12:54    [name] # 68 year old female patient with no prior history of malignancy/or PE-is currently admitted to hospital for acute shortness of breath.  CT chest shows acute PE; multiple liver lesions  #Multiple liver lesions-with subcentimeter lung nodules-suspicious of malignancy/given history of smoking/weight loss.  Primary is unclear based upon imaging at this time.  Liver biopsy on hold because of patient most recently being on Plavix; patient awaiting to have biopsy on 12/07.  Inpatient versus outpatient-depending upon discharge/stability from respiratory status.  #Acute PE-right heart strain status post thrombectomy-on anticoagulation; unfortunately patient seems to be desaturation on exertion.  Defer to primary team/pulmonary for recommendations.  Discussed with Dr.A.   #Microcytic anemia-unclear etiology-worse- anemia of chronic disease [more likely given elevated ferritin >1000] s/p monitor PRBC transfusion hemoglobin 8.5.   #Spoke to patient's Sister Audrey Chan yesterday regarding the plan.   Audrey Sickle, MD 01/24/2019  2:56 PM

## 2019-01-24 NOTE — Progress Notes (Signed)
ANTICOAGULATION CONSULT NOTE  Pharmacy Consult for Heparin Indication: pulmonary embolus  No Known Allergies  Patient Measurements: Height: 5\' 6"  (167.6 cm) Weight: 122 lb (55.3 kg) IBW/kg (Calculated) : 59.3 HEPARIN DW (KG): 55.3  Vital Signs: Temp: 97.9 F (36.6 C) (12/03 2022) Temp Source: Oral (12/03 2022) BP: 126/65 (12/03 2100) Pulse Rate: 101 (12/03 2100)  Labs: Recent Labs    01/21/19 0717 01/22/19 0421 01/22/19 1631 01/22/19 2121 01/23/19 0501 01/23/19 2120  HGB 8.3* 8.0*  --   --  7.5*  --   HCT 25.3* 25.7*  --   --  22.9*  --   PLT 351 381  --   --  345  --   HEPARINUNFRC 0.37 0.31  --  0.14* 0.34 0.35  CREATININE 1.42* 1.16* 1.33*  --   --   --    Estimated Creatinine Clearance: 34.9 mL/min (A) (by C-G formula based on SCr of 1.33 mg/dL (H)).  Medical History: Past Medical History:  Diagnosis Date  . Allergic rhinitis   . Aneurysm of unspecified site ([place])   . Cholelithiasis   . Diabetes type 2, controlled ([place])   . GERD (gastroesophageal reflux disease)   . Hypertension   . Leg DVT (deep venous thromboembolism), acute, bilateral ([place]) 01/21/2019  . Multiple subsegmental pulmonary emboli without acute cor pulmonale ([place]) 01/20/2019  . Neuropathy   . Veno-occlusive disease    Medications:  Medications Prior to Admission  Medication Sig Dispense Refill Last Dose  . acetaminophen (TYLENOL) 500 MG tablet Take 500-1,000 mg by mouth every 6 (six) hours as needed for moderate pain.    prn at prn  . albuterol (PROVENTIL HFA;VENTOLIN HFA) 108 (90 BASE) MCG/ACT inhaler Inhale 2 puffs into the lungs every 6 (six) hours as needed for wheezing or shortness of breath.    prn at prn  . amLODipine (NORVASC) 5 MG tablet Take 1 tablet (5 mg total) by mouth daily. 30 tablet 0 01/19/2019 at 0800  . aspirin EC 81 MG tablet Take 81 mg by mouth daily.   01/19/2019 at 0800  . cetirizine (ZYRTEC) 10 MG tablet Take 10 mg by mouth daily.    01/19/2019 at 0800  .  Cholecalciferol (VITAMIN D3) 1000 units CAPS Take 1,000 Units by mouth daily.    01/19/2019 at 0800  . clopidogrel (PLAVIX) 75 MG tablet Take 75 mg by mouth daily.   01/19/2019 at 0800  . ferrous sulfate 325 (65 FE) MG tablet Take 325 mg by mouth 2 (two) times daily with a meal.    01/19/2019 at 0800  . fluticasone (FLONASE) 50 MCG/ACT nasal spray Place 2 sprays into both nostrils daily as needed for allergies or rhinitis.    prn at prn  . gabapentin (NEURONTIN) 800 MG tablet Take 800 mg by mouth 3 (three) times daily.    01/19/2019 at 0800  . hydrochlorothiazide (HYDRODIURIL) 25 MG tablet Take 25 mg by mouth daily.   01/19/2019 at 0800  . insulin aspart (NOVOLOG) 100 UNIT/ML injection Check blood sugar prior to meals and give insulin on the following scale: Blood sugar less than 150: 0 units BS 150-200: 2 units BS 200-250: 3 units BS 250-300: 4 units BS 300-350: 5 units BS 350-400: 6 units BS above 400: 8 units and call your doctor. 10 mL 1 01/19/2019 at Unknown time  . LANTUS SOLOSTAR 100 UNIT/ML Solostar Pen Inject 25 Units into the skin at bedtime. 15 mL 0 Past Week at Unknown time  . lisinopril (  ZESTRIL) 10 MG tablet Take 2 tablets (20 mg total) by mouth daily. 30 tablet 0 01/19/2019 at 0800  . metFORMIN (GLUCOPHAGE-XR) 500 MG 24 hr tablet Take 1,000 mg by mouth daily.    01/19/2019 at 0800  . metoprolol tartrate (LOPRESSOR) 25 MG tablet Take 12.5 mg by mouth 2 (two) times daily.   01/19/2019 at 0800  . Multiple Vitamin (MULTI-VITAMINS) TABS Take 1 tablet by mouth daily.   01/19/2019 at 0800  . polyethylene glycol powder (GLYCOLAX/MIRALAX) powder Take 0.5 Containers by mouth daily as needed for moderate constipation.    prn at prn  . umeclidinium-vilanterol (ANORO ELLIPTA) 62.5-25 MCG/INH AEPB Inhale 1 puff into the lungs daily.   prn at prn  . APIDRA SOLOSTAR 100 UNIT/ML Solostar Pen Inject 0-50 Units into the skin.     Marland Kitchen diclofenac (FLECTOR) 1.3 % PTCH Apply 1 patch topically 2 (two)  times daily. Apply 1 patch twice a day.     . mometasone (ELOCON) 0.1 % cream Apply 1 application topically 2 (two) times daily as needed (rash).    Not Taking at Unknown time  . rosuvastatin (CRESTOR) 20 MG tablet Take 20 mg by mouth daily.    Not Taking at Unknown time   Assessment: Pharmacy consulted for heparin dosing for 68 yo female initiated on heparin for bilateral pulmonary embolism and DVT; suspected new diagnosis metastatic disease. Patient with past medical history significant for diabetes, CVA, HTN, COPD, and hyperlipidemia. Pharmacy asked to initiate and monitor Heparin for PE.  Heparin infusing at 1200 units/hr.   12/3 Heparin restarted @ 1200 units/hrs   Goal of Therapy:  Heparin level 0.3-0.7 units/ml Monitor platelets by anticoagulation protocol: Yes   Plan:  12/3 @ 2120 HL: 0.35. Level is therapeutic x1.  Will continue current infusion rate of  1200 units/hr  Will check confirmatory  anti-Xa level in 6 hours and CBC with am labs.   Patient to be discharged on apixaban.   Pharmacy will continue to monitor and adjust per consult.    Pernell Dupre, PharmD, BCPS Clinical Pharmacist 01/24/2019 12:34 AM

## 2019-01-24 NOTE — Progress Notes (Signed)
[facility] Daily Progress Note   Subjective: 2 Days Post-Op: 1. Contrast injection right heart 2. Mechanical thrombectomyleft lower lobe and left upper lobe pulmonary arteries and mechanical thrombectomy to the right middle lobe and right upper lobepulmonary arteries 4. Selective catheter placement rightright upper lobe, right middle lobe, and right lower lobe pulmonary arteries 5. Selective catheter placement leftleft lower lobe and left upper lobe pulmonary arteries  Patient denies SOB this AM. States only ambulation was to bathroom but without SOB. No issues overnight.   Objective: Vitals:   01/24/19 0938 01/24/19 1000 01/24/19 1035 01/24/19 1100  BP: (!) 142/71     Pulse: 95 99 98 95  Resp:  18 19 14   Temp:      TempSrc:      SpO2:  94% 100% 99%  Weight:      Height:        Intake/Output Summary (Last 24 hours) at 01/24/2019 1108 Last data filed at 01/24/2019 1101 Gross per 24 hour  Intake 4084.34 ml  Output 1125 ml  Net 2959.34 ml   Physical Exam: A&Ox3, NAD - on  CV: RRR Pulmonary: CTA Bilaterally, Non-laboreed Abdomen: Soft, Nontender, Nondistended Right Groin:             Access Site: No bleeding noted. No swelling / ecchymosis noted Vascular:             Lower Extremities: Soft, warm distally to toes, non-tender, minimal edema   Laboratory: CBC    Component Value Date/Time   WBC 11.8 (H) 01/24/2019 0449   HGB 8.6 (L) 01/24/2019 0449   HCT 26.2 (L) 01/24/2019 0449   PLT 280 01/24/2019 0449   BMET    Component Value Date/Time   NA 136 01/24/2019 0449   K 4.4 01/24/2019 0449   CL 109 01/24/2019 0449   CO2 17 (L) 01/24/2019 0449   GLUCOSE 296 (H) 01/24/2019 0449   BUN 20 01/24/2019 0449   CREATININE 1.41 (H) 01/24/2019 0449   CALCIUM 8.5 (L) 01/24/2019 0449   GFRNONAA 38 (L) 01/24/2019 0449   GFRAA 44 (L) 01/24/2019 0449   Assessment/Planning: The patient is a  69 year old female with multiple medical issues including recent diagnosis of bilateral DVT and PE s/p pulmonary thrombectomy POD#2 1) PE: Successful pulmonary thrombectomy.  Patient with improved shortness of breath.  Start to wean supplemental oxygen. Ambulate.  2) DVT: Continues to be asymptomatic with unremarkable physical exam. No intervention needed. Will follow up in the out patient setting. 3) Anticoagluation: On full dose Lovenox.  Discussed with Dr. Ellis Parents Avaley Coop PA-C 01/24/2019 11:08 AM

## 2019-01-24 NOTE — Progress Notes (Signed)
Pulmonary Medicine          Date: 01/24/2019,   MRN# [id number] Audrey Chan 1950-12-11     AdmissionWeight: 55.3 kg                 CurrentWeight: 55.3 kg  Referring physician: Dr. Sarajane Jews    CHIEF COMPLAINT:   Bilateral PE and possible malignancy.    SUBJECTIVE    Patient clinically improved. S/p PT/OT today. Plan for outpatient lovenox and liver bx on Monday. Discussed case with Dr Posey Pronto and Dr B  PAST MEDICAL HISTORY   Past Medical History:  Diagnosis Date   Allergic rhinitis    Aneurysm of unspecified site [facility])    Cholelithiasis    Diabetes type 2, controlled ([place])    GERD (gastroesophageal reflux disease)    Hypertension    Leg DVT (deep venous thromboembolism), acute, bilateral ([place]) 01/21/2019   Multiple subsegmental pulmonary emboli without acute cor pulmonale ([place]) 01/20/2019   Neuropathy    Veno-occlusive disease      SURGICAL HISTORY   Past Surgical History:  Procedure Laterality Date   ABDOMINAL HYSTERECTOMY  1972   CERVICAL FUSION     CHOLECYSTECTOMY N/A 12/28/2014   Procedure: LAPAROSCOPIC CHOLECYSTECTOMY;  Surgeon: Sherri Rad, MD;  Location: [facility] ORS;  Service: General;  Laterality: N/A;   EYE SURGERY     detached retina   MASS EXCISION Left 05/06/2018   Procedure: EXCISION OF LEFT ANTECUBITAL CYST, DIABETIC;  Surgeon: Vickie Epley, MD;  Location: [facility] ORS;  Service: Vascular;  Laterality: Left;   PULMONARY THROMBECTOMY Bilateral 01/22/2019   Procedure: PULMONARY THROMBECTOMY;  Surgeon: Algernon Huxley, MD;  Location: [place] [facility];  Service: Cardiovascular;  Laterality: Bilateral;     FAMILY HISTORY   Family History  Problem Relation Age of Onset   Lung cancer Father      SOCIAL HISTORY   Social History   Tobacco Use   Smoking status: Former Smoker    Quit date: 02/21/1999    Years since quitting: 19.9   Smokeless tobacco: Never Used  Substance Use Topics   Alcohol use: No   Alcohol/week: 0.0 standard drinks   Drug use: No     MEDICATIONS    Home Medication:    Current Medication:  Current Facility-Administered Medications:    acetaminophen (TYLENOL) tablet 650 mg, 650 mg, Oral, Q6H PRN **OR** acetaminophen (TYLENOL) suppository 650 mg, 650 mg, Rectal, Q6H PRN, Dew, Erskine Squibb, MD   albuterol (VENTOLIN HFA) 108 (90 Base) MCG/ACT inhaler 2 puff, 2 puff, Inhalation, Q6H PRN, Dew, Erskine Squibb, MD   amLODipine (NORVASC) tablet 5 mg, 5 mg, Oral, Daily, Dew, Erskine Squibb, MD, 5 mg at 01/24/19 [id number]   Chlorhexidine Gluconate Cloth 2 % PADS 6 each, 6 each, Topical, Daily, Dew, Erskine Squibb, MD, 6 each at 01/23/19 1023   cholecalciferol (VITAMIN D) tablet 1,000 Units, 1,000 Units, Oral, Daily, Algernon Huxley, MD, 1,000 Units at 01/24/19 0936   ferrous sulfate tablet 325 mg, 325 mg, Oral, BID WC, Dew, Erskine Squibb, MD, 325 mg at 01/24/19 0936   fluticasone (FLONASE) 50 MCG/ACT nasal spray 2 spray, 2 spray, Each Nare, Daily PRN, Algernon Huxley, MD   gabapentin (NEURONTIN) capsule 600 mg, 600 mg, Oral, TID, Lucky Cowboy, Erskine Squibb, MD, 600 mg at 01/24/19 0936   heparin ADULT infusion 100 units/mL (25000 units/221mL sodium chloride 0.45%), 1,200 Units/hr, Intravenous, Continuous, [name], MD, Last Rate: 12 mL/hr at 01/24/19 0945, 1,200  Units/hr at 01/24/19 0945   heparin lock flush 100 unit/mL, 500 Units, Intracatheter, Daily PRN, Charlaine Dalton R, MD   heparin lock flush 100 unit/mL, 250 Units, Intracatheter, PRN, Charlaine Dalton R, MD   insulin aspart (novoLOG) injection 0-6 Units, 0-6 Units, Subcutaneous, Q4H, [name], MD, 2 Units at 01/24/19 0915   ipratropium-albuterol (DUONEB) 0.5-2.5 (3) MG/3ML nebulizer solution 3 mL, 3 mL, Nebulization, TID, Lanney Gins, Anab Vivar, MD, 3 mL at 01/24/19 0743   lactated ringers infusion, , Intravenous, Continuous, Lanney Gins, Caroljean Monsivais, MD, Last Rate: 125 mL/hr at 01/24/19 0600   loratadine (CLARITIN) tablet 10 mg, 10 mg, Oral, Daily, Dew, Erskine Squibb,  MD, 10 mg at 01/24/19 0936   metoprolol tartrate (LOPRESSOR) tablet 12.5 mg, 12.5 mg, Oral, BID, Algernon Huxley, MD, 12.5 mg at 01/24/19 [id number]   multivitamin with minerals tablet 1 tablet, 1 tablet, Oral, Daily, Algernon Huxley, MD, 1 tablet at 01/24/19 0936   ondansetron (ZOFRAN) injection 4 mg, 4 mg, Intravenous, Q6H PRN, Dew, Erskine Squibb, MD   polyethylene glycol (MIRALAX / GLYCOLAX) packet 17 g, 1 packet, Oral, Daily PRN, Lucky Cowboy, Erskine Squibb, MD   protein supplement (ENSURE MAX) liquid, 11 oz, Oral, BID BM, [name], MD, 11 oz at 01/24/19 0933   sodium chloride flush (NS) 0.9 % injection 10 mL, 10 mL, Intracatheter, PRN, Charlaine Dalton R, MD   sodium chloride flush (NS) 0.9 % injection 3 mL, 3 mL, Intracatheter, PRN, Rogue Bussing, Elisha Headland, MD   umeclidinium-vilanterol (ANORO ELLIPTA) 62.5-25 MCG/INH 1 puff, 1 puff, Inhalation, Daily, Dew, Erskine Squibb, MD, 1 puff at 01/24/19 0934    ALLERGIES   Patient has no known allergies.     REVIEW OF SYSTEMS    Review of Systems:  Gen:  Denies  fever, sweats, chills weigh loss  HEENT: Denies blurred vision, double vision, ear pain, eye pain, hearing loss, nose bleeds, sore throat Cardiac:  No dizziness, chest pain or heaviness, chest tightness,edema Resp:   Denies cough or sputum porduction, shortness of breath,wheezing, hemoptysis,  Gi: Denies swallowing difficulty, stomach pain, nausea or vomiting, diarrhea, constipation, bowel incontinence Gu:  Denies bladder incontinence, burning urine Ext:   Denies Joint pain, stiffness or swelling Skin: Denies  skin rash, easy bruising or bleeding or hives Endoc:  Denies polyuria, polydipsia , polyphagia or weight change Psych:   Denies depression, insomnia or hallucinations   Other:  All other systems negative   VS: BP (!) 142/71    Pulse 95    Temp 98.1 F (36.7 C) (Oral)    Resp 18    Ht 5\' 6"  (1.676 m)    Wt 55.3 kg    SpO2 97%    BMI 19.69 kg/m      PHYSICAL EXAM    GENERAL:NAD, no  fevers, chills, no weakness no fatigue HEAD: Normocephalic, atraumatic.  EYES: Pupils equal, round, reactive to light. Extraocular muscles intact. No scleral icterus.  MOUTH: Moist mucosal membrane. Dentition intact. No abscess noted.  EAR, NOSE, THROAT: Clear without exudates. No external lesions.  NECK: Supple. No thyromegaly. No nodules. No JVD.  PULMONARY: CTA bialterally.  CARDIOVASCULAR: S1 and S2. Regular rate and rhythm. No murmurs, rubs, or gallops. No edema. Pedal pulses 2+ bilaterally.  GASTROINTESTINAL: Soft, nontender, nondistended. No masses. Positive bowel sounds. No hepatosplenomegaly.  MUSCULOSKELETAL: No swelling, clubbing, or edema. Range of motion full in all extremities.  NEUROLOGIC: Cranial nerves II through XII are intact. No gross focal neurological deficits. Sensation intact. Reflexes intact.  SKIN: No  ulceration, lesions, rashes, or cyanosis. Skin warm and dry. Turgor intact.  PSYCHIATRIC: Mood, affect within normal limits. The patient is awake, alert and oriented x 3. Insight, judgment intact.       IMAGING    Ct Head Wo Contrast  Result Date: 12/31/2018 CLINICAL DATA:  Seizure EXAM: CT HEAD WITHOUT CONTRAST TECHNIQUE: Contiguous axial images were obtained from the base of the skull through the vertex without intravenous contrast. COMPARISON:  None. FINDINGS: Brain: There is no acute intracranial hemorrhage, mass-effect, or edema. There is no new loss of gray-white differentiation. Confluent areas of hypoattenuation in the supratentorial white matter are nonspecific but likely reflect stable advanced microvascular ischemic changes. Chronic small infarcts of the left cerebellum and thalamus again noted. There is no extra-axial collection. Ventricles are stable in size. Vascular: No hyperdense vessel. Skull: Calvarium is unremarkable. Sinuses/Orbits: Mild paranasal sinus mucosal thickening. Orbits are unremarkable. Other: Mastoid air cells are clear. IMPRESSION: No  acute or new findings since December 29, 2018. Stable chronic findings detailed above. Electronically Signed   By: Macy Mis M.D.   On: 12/31/2018 13:15   Ct Head Wo Contrast  Result Date: 12/29/2018 CLINICAL DATA:  Loss of consciousness EXAM: CT HEAD WITHOUT CONTRAST TECHNIQUE: Contiguous axial images were obtained from the base of the skull through the vertex without intravenous contrast. COMPARISON:  None. FINDINGS: Brain: No evidence of acute territorial infarction, hemorrhage, hydrocephalus,extra-axial collection or mass lesion/mass effect. The ventricles are normal in size and contour. Low-attenuation changes in the deep white matter consistent with small vessel ischemia. Vascular: No hyperdense vessel or unexpected calcification. Skull: The skull is intact. No fracture or focal lesion identified. Sinuses/Orbits: The visualized paranasal sinuses and mastoid air cells are clear. The orbits and globes intact. Other: None IMPRESSION: No acute intracranial abnormality. Findings consistent with chronic small vessel ischemia Electronically Signed   By: Prudencio Pair M.D.   On: 12/29/2018 23:08   Ct Angio Chest Pe W/cm &/or Wo Cm  Result Date: 01/20/2019 CLINICAL DATA:  Suspected covid, elevated LFTs, recent stroke EXAM: CT ANGIOGRAPHY CHEST, abdomen, and pelvis WITH CONTRAST TECHNIQUE: Multidetector CT imaging of the chest, abdomen, and pelvis was performed using the standard protocol during bolus administration of intravenous contrast. Multiplanar CT image reconstructions and MIPs were obtained to evaluate the vascular anatomy. CONTRAST:  21mL OMNIPAQUE IOHEXOL 350 MG/ML SOLN COMPARISON:  CT chest September 13, 2017 FINDINGS: Cardiovascular: There is a optimal opacification of the pulmonary arteries. There is a small nonocclusive thrombus seen within the left main pulmonary artery which extends into the anterior left upper lobe segmental branches and partially occlusive thrombus in the posterior left lower  lobe subsegmental branches. There is also partially occlusive thrombus seen within the posterior right upper lobe segmental branch, series 6, image 37. There is also partially occlusive thrombus seen extending into the right middle lobe subsegmental branches and posterior right lower lobe subsegmental branches. The heart is normal in size. No pericardial effusion or thickening. There is slight straightening of the interventricular septum which could be due to early right ventricular heart strain. There is normal three-vessel brachiocephalic anatomy without proximal stenosis. Scattered atherosclerosis is noted. The thoracic aorta is normal in appearance. Mediastinum/Nodes: No hilar, mediastinal, or axillary adenopathy. Thyroid gland, trachea, and esophagus demonstrate no significant findings. Lungs/Pleura: Small subcentimeter pulmonary nodule are again identified as on prior exam dating back to 2019, for example in the posterior right upper lobe there is a 5 mm pulmonary nodule, series 8, image 21 and  anterior right lower lobe, series 8, image 54 a 4 mm pulmonary nodule. There is however a new small nodular opacity in the posterior left upper lobe, series 8, image 47 measuring 7 mm not seen on prior exam. No large airspace consolidation or pleural effusion is seen. Centrilobular emphysematous changes seen within both lung apices. Musculoskeletal: No chest wall abnormality. No acute or significant osseous findings. Review of the MIP images confirms the above findings. Abdomen/pelvis: Hepatobiliary: There are multiple low-density liver lesions seen throughout which were not seen on the prior exam. There is also heterogeneous enhancement seen throughout the liver parenchyma. Central periportal edema is noted. The portal vein is patent. The patient is status post cholecystectomy. A small amount of perihepatic ascites is noted. Pancreas: Unremarkable. No pancreatic ductal dilatation or surrounding inflammatory changes.  Spleen: Normal in size without focal abnormality. Adrenals/Urinary Tract: Both adrenal glands appear normal. The kidneys and collecting system appear normal without evidence of urinary tract calculus or hydronephrosis. Bladder is unremarkable. Stomach/Bowel: The stomach, small bowel, and colon are normal in appearance. No inflammatory changes, wall thickening, or obstructive findings. Vascular/Lymphatic: There are no enlarged mesenteric, retroperitoneal, or pelvic lymph nodes. Scattered aortic atherosclerotic calcifications are seen without aneurysmal dilatation. Reproductive: The uterus and adnexa are unremarkable. Other: No evidence of abdominal wall mass or hernia. Musculoskeletal: No acute or significant osseous findings. IMPRESSION: 1. Bilateral partially occlusive segmental and subsegmental pulmonary emboli as described above. 2. Findings which could be suggestive of early right ventricular heart strain. 3. Multiple unchanged subcentimeter pulmonary nodules as on prior exam dating back to 2019. 4. New nodular opacity within the posterior left upper lobe measuring 7 mm which is nonspecific, and would recommend attention on short interval follow-up exam. 5. Centrilobular emphysematous changes. 6. Numerous hepatic lesions throughout, likely suggestive of hepatic metastatic disease. 7. Small amount of perihepatic ascites 8. These results were called by telephone at the time of interpretation on 01/20/2019 at 2:07 am to provider [facility] , who verbally acknowledged these results. Electronically Signed   By: Prudencio Pair M.D.   On: 01/20/2019 02:09   Mr Jeri Cos [id number] Contrast  Result Date: 01/01/2019 CLINICAL DATA:  Encephalopathy.  DKA. EXAM: MRI HEAD WITHOUT AND WITH CONTRAST TECHNIQUE: Multiplanar, multiecho pulse sequences of the brain and surrounding structures were obtained without and with intravenous contrast. CONTRAST:  2mL GADAVIST GADOBUTROL 1 MMOL/ML IV SOLN COMPARISON:  CT head 12/31/2018  FINDINGS: Brain: Small acute infarct left inferior cerebellum. No other acute infarct Chronic infarct left inferior cerebellum. Extensive white matter disease bilaterally. Diffuse subcortical and deep white matter hyperintensity throughout both cerebral hemispheres. Hyperintensity is also noted in the right thalamus. Negative for hemorrhage or mass. Ventricle size normal. Normal enhancement postcontrast administration Vascular: Normal arterial flow voids Skull and upper cervical spine: No focal bony abnormality. Cervical spine fusion hardware. Sinuses/Orbits: Negative Other: None IMPRESSION: 1. Acute small infarct left medial cerebellum. 2. Extensive disease throughout the white matter most compatible with chronic ischemia. Chronic infarct left cerebellum. Electronically Signed   By: Franchot Gallo M.D.   On: 01/01/2019 15:05   Ct Abd/pelvis W/ Iv Contrast  Result Date: 01/20/2019 CLINICAL DATA:  Suspected covid, elevated LFTs, recent stroke EXAM: CT ANGIOGRAPHY CHEST, abdomen, and pelvis WITH CONTRAST TECHNIQUE: Multidetector CT imaging of the chest, abdomen, and pelvis was performed using the standard protocol during bolus administration of intravenous contrast. Multiplanar CT image reconstructions and MIPs were obtained to evaluate the vascular anatomy. CONTRAST:  16mL OMNIPAQUE IOHEXOL 350 MG/ML  SOLN COMPARISON:  CT chest September 13, 2017 FINDINGS: Cardiovascular: There is a optimal opacification of the pulmonary arteries. There is a small nonocclusive thrombus seen within the left main pulmonary artery which extends into the anterior left upper lobe segmental branches and partially occlusive thrombus in the posterior left lower lobe subsegmental branches. There is also partially occlusive thrombus seen within the posterior right upper lobe segmental branch, series 6, image 37. There is also partially occlusive thrombus seen extending into the right middle lobe subsegmental branches and posterior right lower  lobe subsegmental branches. The heart is normal in size. No pericardial effusion or thickening. There is slight straightening of the interventricular septum which could be due to early right ventricular heart strain. There is normal three-vessel brachiocephalic anatomy without proximal stenosis. Scattered atherosclerosis is noted. The thoracic aorta is normal in appearance. Mediastinum/Nodes: No hilar, mediastinal, or axillary adenopathy. Thyroid gland, trachea, and esophagus demonstrate no significant findings. Lungs/Pleura: Small subcentimeter pulmonary nodule are again identified as on prior exam dating back to 2019, for example in the posterior right upper lobe there is a 5 mm pulmonary nodule, series 8, image 21 and anterior right lower lobe, series 8, image 54 a 4 mm pulmonary nodule. There is however a new small nodular opacity in the posterior left upper lobe, series 8, image 47 measuring 7 mm not seen on prior exam. No large airspace consolidation or pleural effusion is seen. Centrilobular emphysematous changes seen within both lung apices. Musculoskeletal: No chest wall abnormality. No acute or significant osseous findings. Review of the MIP images confirms the above findings. Abdomen/pelvis: Hepatobiliary: There are multiple low-density liver lesions seen throughout which were not seen on the prior exam. There is also heterogeneous enhancement seen throughout the liver parenchyma. Central periportal edema is noted. The portal vein is patent. The patient is status post cholecystectomy. A small amount of perihepatic ascites is noted. Pancreas: Unremarkable. No pancreatic ductal dilatation or surrounding inflammatory changes. Spleen: Normal in size without focal abnormality. Adrenals/Urinary Tract: Both adrenal glands appear normal. The kidneys and collecting system appear normal without evidence of urinary tract calculus or hydronephrosis. Bladder is unremarkable. Stomach/Bowel: The stomach, small bowel,  and colon are normal in appearance. No inflammatory changes, wall thickening, or obstructive findings. Vascular/Lymphatic: There are no enlarged mesenteric, retroperitoneal, or pelvic lymph nodes. Scattered aortic atherosclerotic calcifications are seen without aneurysmal dilatation. Reproductive: The uterus and adnexa are unremarkable. Other: No evidence of abdominal wall mass or hernia. Musculoskeletal: No acute or significant osseous findings. IMPRESSION: 1. Bilateral partially occlusive segmental and subsegmental pulmonary emboli as described above. 2. Findings which could be suggestive of early right ventricular heart strain. 3. Multiple unchanged subcentimeter pulmonary nodules as on prior exam dating back to 2019. 4. New nodular opacity within the posterior left upper lobe measuring 7 mm which is nonspecific, and would recommend attention on short interval follow-up exam. 5. Centrilobular emphysematous changes. 6. Numerous hepatic lesions throughout, likely suggestive of hepatic metastatic disease. 7. Small amount of perihepatic ascites 8. These results were called by telephone at the time of interpretation on 01/20/2019 at 2:07 am to provider [facility] , who verbally acknowledged these results. Electronically Signed   By: Prudencio Pair M.D.   On: 01/20/2019 02:09   US Venous Img Lower Bilateral (dvt)  Result Date: 01/20/2019 CLINICAL DATA:  History of pulmonary embolism, now with lower extremity pain. Evaluate for DVT. EXAM: BILATERAL LOWER EXTREMITY VENOUS DOPPLER ULTRASOUND TECHNIQUE: Gray-scale sonography with graded compression, as well as color  Doppler and duplex ultrasound were performed to evaluate the lower extremity deep venous systems from the level of the common femoral vein and including the common femoral, femoral, profunda femoral, popliteal and calf veins including the posterior tibial, peroneal and gastrocnemius veins when visible. The superficial great saphenous vein was also  interrogated. Spectral Doppler was utilized to evaluate flow at rest and with distal augmentation maneuvers in the common femoral, femoral and popliteal veins. COMPARISON:  Chest CTA-01/20/2019 FINDINGS: RIGHT LOWER EXTREMITY Common Femoral Vein: No evidence of thrombus. Normal compressibility, respiratory phasicity and response to augmentation. Saphenofemoral Junction: No evidence of thrombus. Normal compressibility and flow on color Doppler imaging. Profunda Femoral Vein: No evidence of thrombus. Normal compressibility and flow on color Doppler imaging. Femoral Vein: No evidence of thrombus. Normal compressibility, respiratory phasicity and response to augmentation. Popliteal Vein: No evidence of thrombus. Normal compressibility, respiratory phasicity and response to augmentation. Calf Veins: There is hypoechoic occlusive thrombus within both paired right peroneal veins (images 58 and 59). Both paired right posterior tibial veins appear patent where imaged. Superficial Great Saphenous Vein: No evidence of thrombus. Normal compressibility. Other Findings:  None. _________________________________________________________ LEFT LOWER EXTREMITY Common Femoral Vein: No evidence of thrombus. Normal compressibility, respiratory phasicity and response to augmentation. Saphenofemoral Junction: No evidence of thrombus. Normal compressibility and flow on color Doppler imaging. Profunda Femoral Vein: No evidence of thrombus. Normal compressibility and flow on color Doppler imaging. Femoral Vein: No evidence of thrombus. Normal compressibility, respiratory phasicity and response to augmentation. Popliteal Vein: No evidence of thrombus. Normal compressibility, respiratory phasicity and response to augmentation. Calf Veins: There is hypoechoic occlusive thrombus within 1 of the paired left peroneal veins (images 29 and 30). The adjacent paired left peroneal vein as well though both paired posterior tibial veins appear patent.  Superficial Great Saphenous Vein: No evidence of thrombus. Normal compressibility. Other Findings:  None. IMPRESSION: Examination is positive for occlusive DVT involving both paired right peroneal veins as well as one of two paired left peroneal veins. There is no extension of this distal tibial DVT to the more proximal venous system of either lower extremity. Electronically Signed   By: [name] Mariscal M.D.   On: 01/20/2019 17:02   Dg Chest Port 1 View  Result Date: 01/23/2019 CLINICAL DATA:  Hypertension, pulmonary emboli EXAM: PORTABLE CHEST 1 VIEW COMPARISON:  01/20/2019, 01/19/2019 FINDINGS: The heart size and mediastinal contours are stable. No new focal airspace consolidation, pleural effusion, or pneumothorax. Lungs are mildly hyperexpanded. The visualized skeletal structures are unremarkable. IMPRESSION: Stable appearance of the chest without new airspace opacity. Electronically Signed   By: Davina Poke M.D.   On: 01/23/2019 12:54   Dg Chest Portable 1 View  Result Date: 01/19/2019 CLINICAL DATA:  Shortness of breath EXAM: PORTABLE CHEST 1 VIEW COMPARISON:  12/31/2018 FINDINGS: Cardiomegaly. Lungs clear. No effusions or edema. No acute bony abnormality. IMPRESSION: Cardiomegaly.  No active disease. Electronically Signed   By: Rolm Baptise M.D.   On: 01/19/2019 23:33   Dg Chest Port 1 View  Result Date: 12/31/2018 CLINICAL DATA:  Sepsis EXAM: PORTABLE CHEST 1 VIEW COMPARISON:  December 29, 2018 which is FINDINGS: The heart size and mediastinal contours are unchanged. Again noted is prominence to the central pulmonary vasculature. There is mildly increased interstitial markings seen at both lower lungs slightly more pronounced than the prior exam. No pleural effusion. No acute osseous abnormality. IMPRESSION: Pulmonary vascular congestion and mildly increased interstitial markings at both lower lungs, slightly more pronounced than  the prior exam. This could be due to interstitial edema and/or  early infectious etiology. Electronically Signed   By: Prudencio Pair M.D.   On: 12/31/2018 03:38   Dg Chest Port 1 View  Result Date: 12/29/2018 CLINICAL DATA:  Altered mental status EXAM: PORTABLE CHEST 1 VIEW COMPARISON:  November 12, 2016 FINDINGS: The heart size and mediastinal contours are within normal limits. There is prominence to the central pulmonary vasculature. No large airspace consolidation or pleural effusion. IMPRESSION: Findings suggestive pulmonary vascular congestion. Electronically Signed   By: Prudencio Pair M.D.   On: 12/29/2018 21:16     IMPRESSION: 1. Bilateral partially occlusive segmental and subsegmental pulmonary emboli as described above. 2. Findings which could be suggestive of early right ventricular heart strain. 3. Multiple unchanged subcentimeter pulmonary nodules as on prior exam dating back to 2019. 4. New nodular opacity within the posterior left upper lobe measuring 7 mm which is nonspecific, and would recommend attention on short interval follow-up exam. 5. Centrilobular emphysematous changes. 6. Numerous hepatic lesions throughout, likely suggestive of hepatic metastatic disease. 7. Small amount of perihepatic ascites 8. These results were called by telephone at the time of interpretation on 01/20/2019 at 2:07 am to provider [facility] , who verbally acknowledged these results.   Electronically Signed   By: Prudencio Pair M.D.   On: 01/20/2019 02:09    ASSESSMENT/PLAN   Acute hypoxemic respiratory failure secondary to non-massive bilateral partially occlusive pulmonary embolism  -s/p thrombectomy with clinical improvement -CT abdomen with suggestion of liver metastasis - likely etiology of PE is malignancy -c/w heparin, plan for lovenox -s/p evaluation by oncology - appreciate input -Discussed case with IR - plan for Liver bx monday TTE with-Tricuspid regurgitant velocity 3.62 m/s indicative of pulmonary hypertension -plan for lovenox  on oupatient while awaiting liver bx    DVT  -occlusive DVT involving both paired right peroneal veins as well as one of two paired left peroneal veins   Chronic COPD  -Continue DuoNeb every 6 hours while awake  -Chest physiotherapy with MetaNeb to combat atelectatic segments -Please encourage patient to use incentive spirometer at the bedside at least every hour few times No clinical signs or symptoms of acute exacerbation of COPD    Thank you for allowing me to participate in the care of this patient.   Patient/Family are satisfied with care plan and all questions have been answered.  This document was prepared using Dragon voice recognition software and may include unintentional dictation errors.     Ottie Glazier, M.D.  Division of [place]

## 2019-01-24 NOTE — Progress Notes (Signed)
Inpatient Diabetes Program Recommendations  AACE/ADA: New Consensus Statement on Inpatient Glycemic Control   Target Ranges:  Prepandial:   less than 140 mg/dL      Peak postprandial:   less than 180 mg/dL (1-2 hours)      Critically ill patients:  140 - 180 mg/dL   Results for OLINE, KNAPKE (MRN [id number]) as of 01/24/2019 11:06  Ref. Range 01/23/2019 07:39 01/23/2019 11:24 01/23/2019 15:35 01/23/2019 20:08 01/24/2019 00:17 01/24/2019 03:44 01/24/2019 07:48  Glucose-Capillary Latest Ref Range: 70 - 99 mg/dL 60 (L) 87 130 (H) 246 (H) 272 (H) 283 (H) 231 (H)   Review of Glycemic Control  Diabetes history: DM2 Outpatient Diabetes medications: Lantsu 25 units QHS, Novolog 2-8 units TID with meals, Metformin 1000 mg BID Current orders for Inpatient glycemic control:  Novolog 0-6 units Q4H  Inpatient Diabetes Program Recommendations:   Insulin - Basal: Was ordered Lantus 5 units QHS but it was discontinued yesterday since fasting glucose 60 mg/dl.  Now that glucose is consistently in 200's mg/dl, please consider ordering Lantus 5 units Q24H.  Insulin - Meal Coverage: If patient is eating at least 50% of meals, please consider ordering Novolog 3 units TID with meals for meal coverage.  Thanks, Barnie Alderman, RN, MSN, CDE Diabetes Coordinator Inpatient Diabetes Program [phone] (Team Pager from 8am to 5pm)

## 2019-01-25 ENCOUNTER — Other Ambulatory Visit: Payer: Self-pay | Admitting: Internal Medicine

## 2019-01-25 ENCOUNTER — Telehealth: Payer: Self-pay | Admitting: Internal Medicine

## 2019-01-25 DIAGNOSIS — C787 Secondary malignant neoplasm of liver and intrahepatic bile duct: Secondary | ICD-10-CM

## 2019-01-25 LAB — BASIC METABOLIC PANEL
Anion gap: 8 (ref 5–15)
BUN: 13 mg/dL (ref 8–23)
CO2: 21 mmol/L — ABNORMAL LOW (ref 22–32)
Calcium: 8.7 mg/dL — ABNORMAL LOW (ref 8.9–10.3)
Chloride: 110 mmol/L (ref 98–111)
Creatinine, Ser: 1.32 mg/dL — ABNORMAL HIGH (ref 0.44–1.00)
GFR calc Af Amer: 48 mL/min — ABNORMAL LOW (ref >60)
GFR calc non Af Amer: 41 mL/min — ABNORMAL LOW (ref >60)
Glucose, Bld: 159 mg/dL — ABNORMAL HIGH (ref 70–99)
Potassium: 3.9 mmol/L (ref 3.5–5.1)
Sodium: 139 mmol/L (ref 135–145)

## 2019-01-25 LAB — CBC
HCT: 27.5 % — ABNORMAL LOW (ref 36.0–46.0)
Hemoglobin: 8.8 g/dL — ABNORMAL LOW (ref 12.0–15.0)
MCH: 23.2 pg — ABNORMAL LOW (ref 26.0–34.0)
MCHC: 32 g/dL (ref 30.0–36.0)
MCV: 72.6 fL — ABNORMAL LOW (ref 80.0–100.0)
Platelets: 268 10*3/uL (ref 150–400)
RBC: 3.79 MIL/uL — ABNORMAL LOW (ref 3.87–5.11)
RDW: 14.9 % (ref 11.5–15.5)
WBC: 15.1 10*3/uL — ABNORMAL HIGH (ref 4.0–10.5)
nRBC: 0 % (ref 0.0–0.2)

## 2019-01-25 LAB — CULTURE, BLOOD (ROUTINE X 2)
Culture: NO GROWTH
Culture: NO GROWTH

## 2019-01-25 LAB — PHOSPHORUS: Phosphorus: 2.9 mg/dL (ref 2.5–4.6)

## 2019-01-25 LAB — GLUCOSE, CAPILLARY
Glucose-Capillary: 158 mg/dL — ABNORMAL HIGH (ref 70–99)
Glucose-Capillary: 241 mg/dL — ABNORMAL HIGH (ref 70–99)

## 2019-01-25 LAB — MAGNESIUM: Magnesium: 1.7 mg/dL (ref 1.7–2.4)

## 2019-01-25 MED ORDER — IPRATROPIUM-ALBUTEROL 0.5-2.5 (3) MG/3ML IN SOLN: 3.0000 mL | Freq: Four times a day (QID) | RESPIRATORY_TRACT | 0 refills | Status: DC | PRN

## 2019-01-25 MED ORDER — ENOXAPARIN SODIUM 60 MG/0.6ML ~~LOC~~ SOLN: 55.0000 mg | Freq: Two times a day (BID) | SUBCUTANEOUS | 0 refills | Status: DC

## 2019-01-25 MED ORDER — INSULIN GLARGINE 100 UNIT/ML ~~LOC~~ SOLN
20.0000 [IU] | Freq: Every day | SUBCUTANEOUS | Status: DC
  Filled 2019-01-25: qty 0.2

## 2019-01-25 MED ORDER — LISINOPRIL 10 MG PO TABS: 10.0000 mg | ORAL_TABLET | Freq: Every day | ORAL | 0 refills | Status: DC

## 2019-01-25 NOTE — TOC Transition Note (Signed)
Transition of Care [facility]) - CM/SW Discharge Note   Patient Details  Name: Audrey Chan MRN: [id number] Date of Birth: November 08, 1950  [facility] CM/SW Contact:  Marshell Garfinkel, RN Phone Number: 01/25/2019, 10:10 AM   Clinical Narrative:     Patient anticipates returning to home today sister. O2, walker, bedside commode, nebulizer, and Lovenox per note is $0 co-pay. She is open to home health through [facility] home health. Patient can discharge when O2 qualifications are met and O2 tank has been delivered to this room.   Final next level of care: [place] Barriers to Discharge: Equipment Delay   Patient Goals and CMS Choice Patient states their goals for this hospitalization and ongoing recovery are:: "return to home" CMS Medicare.gov Compare Post Acute Care list provided to:: Patient Choice offered to / list presented to : Patient  Discharge Placement                       Discharge Plan and Services   Discharge Planning Services: CM Consult Post Acute Care Choice: Durable Medical Equipment, Home Health          DME Arranged: 3-N-1, Oxygen, Nebulizer/meds DME Agency: [employer] Date DME Agency Contacted: 01/25/19 Time DME Agency Contacted: 1008   [place] Arranged: PT, RN HH Agency: [facility] Date [place] Agency Contacted: 01/25/19 Time [place]: 1008 Representative spoke with at [place]: Tax adviser  Social Determinants of Health ([place]) Interventions     Readmission Risk Interventions Readmission Risk Prevention Plan 01/03/2019  Transportation Screening Complete  PCP or Specialist Appt within 5-7 Days Complete  Home Care Screening Complete  Medication Review (RN CM) Complete  Some recent data might be hidden

## 2019-01-25 NOTE — Progress Notes (Signed)
Audrey Chan   DOB:Feb 20, 1951   [id number]    Subjective: Overnight no acute events.  Patient has been ambulating in hallways.  She has been needing oxygen.  Denies any worsening pain.  Objective:  Vitals:   01/24/19 2230 01/25/19 0611  BP:  (!) 110/59  Pulse:  90  Resp:  17  Temp: (!) 100.6 F (38.1 C) 99.2 F (37.3 C)  SpO2:  95%    No intake or output data in the 24 hours ending 01/25/19 1257  Physical Exam  Constitutional: She is oriented to person, place, and time and well-developed, well-nourished, and in no distress.  HENT:  Head: Normocephalic and atraumatic.  Mouth/Throat: Oropharynx is clear and moist. No oropharyngeal exudate.  Eyes: Pupils are equal, round, and reactive to light.  Neck: Normal range of motion. Neck supple.  Cardiovascular: Normal rate and regular rhythm.  Pulmonary/Chest: No respiratory distress. She has no wheezes.  Decreased air entry bilaterally.  Abdominal: Soft. Bowel sounds are normal. She exhibits no distension and no mass. There is no abdominal tenderness. There is no rebound and no guarding.  Musculoskeletal: Normal range of motion.        General: No tenderness or edema.  Neurological: She is alert and oriented to person, place, and time.  Skin: Skin is warm.  Psychiatric: Affect normal.     Labs:  Lab Results  Component Value Date   WBC 15.1 (H) 01/25/2019   HGB 8.8 (L) 01/25/2019   HCT 27.5 (L) 01/25/2019   MCV 72.6 (L) 01/25/2019   PLT 268 01/25/2019   NEUTROABS 7.6 01/19/2019    Lab Results  Component Value Date   NA 139 01/25/2019   K 3.9 01/25/2019   CL 110 01/25/2019   CO2 21 (L) 01/25/2019    Studies:  No results found.  Liver lesion # 68 year old female patient with no prior history of malignancy/or PE-is currently admitted to hospital for acute shortness of breath.  CT chest shows acute PE; multiple liver lesions  #Multiple liver lesions-with subcentimeter lung nodules-suspicious of malignancy/given  history of smoking/weight loss.  Primary is unclear based upon imaging at this time.  Liver biopsy on hold because of patient most recently being on Plavix; patient awaiting to have biopsy on 12/07.  Instructions given to the patient-asked patient to report to the medical mall at 8:00 in the morning on 12/07.   #Acute PE-right heart strain status post thrombectomy-on anticoagulation-with Lovenox.  Patient currently being discharged home on oxygen.  Instructions given to the patient does hold Lovenox-night of 12/06-Sunday-in anticipation of liver biopsy on 12/07.   # Microcytic anemia-unclear etiology-worse- anemia of chronic disease [more likely given elevated ferritin >1000] s/p monitor PRBC transfusion hemoglobin 8.8. STABLE.   #Discussed with Dr. Posey Pronto.  Information given to the patient to follow-up with me in the cancer center on at 8:30 on 12/10-to review the biopsy results Of care.     [name], MD 01/25/2019  12:57 PM

## 2019-01-25 NOTE — Progress Notes (Signed)
Discharge instructions reviewed with patient and her sister. They both verbalized understanding and all questions were answered. Waiting for O2 tank to be delivered.

## 2019-01-25 NOTE — Progress Notes (Signed)
The patient is transferred from ICU to room 1C 115. A & O x 2. No acute distress  noted. Patient is oriented to her room, acom/call bell and staff. Will continue to monitor.

## 2019-01-25 NOTE — Telephone Encounter (Signed)
Pt to follow up with me in cancer center- at 8:30 on 12/10; labs- cbc/cmp.  H/T- please schedule/ inform pt.

## 2019-01-25 NOTE — Progress Notes (Signed)
SATURATION QUALIFICATIONS   Patient Saturations on Room Air at Rest = 90%  Patient Saturations on Room Air while Ambulating = 86%  Patient Saturations on 2Liters of oxygen while Ambulating = 92%  Please briefly explain why patient needs home oxygen:

## 2019-01-25 NOTE — Discharge Summary (Signed)
[place] [facility] [place] NAME: Audrey Chan    MR#:  [id number]  DATE OF BIRTH:  05-25-1950  DATE OF ADMISSION:  01/19/2019 ADMITTING PHYSICIAN: Clance Boll, MD  DATE OF DISCHARGE: 01/25/2019  PRIMARY CARE PHYSICIAN: Theotis Burrow, MD    ADMISSION DIAGNOSIS:  Liver metastases ([place]) [C78.7] Elevated LFTs [R79.89] Liver lesion [K76.9] Acute pulmonary embolism ([place]) [I26.99] Acute respiratory failure with hypoxemia (HCC) [J96.01] Multiple subsegmental pulmonary emboli without acute cor pulmonale (HCC) [I26.94] PE (pulmonary thromboembolism) ([place]) [I26.99]  DISCHARGE DIAGNOSIS:  Acute hypoxic respiratory failure with COPD exacerbation and bilateral pulmonary embolism status post thrombectomy-- now on oxygen bilateral lower extremity DVT multiple liver lesions--- to get biopsy on December 7 by interventional radiology as outpatient  SECONDARY DIAGNOSIS:   Past Medical History:  Diagnosis Date  . Allergic rhinitis   . Aneurysm of unspecified site ([place])   . Cholelithiasis   . Diabetes type 2, controlled ([place])   . GERD (gastroesophageal reflux disease)   . Hypertension   . Leg DVT (deep venous thromboembolism), acute, bilateral ([name]) 01/21/2019  . Multiple subsegmental pulmonary emboli without acute cor pulmonale ([place]) 01/20/2019  . Neuropathy   . Veno-occlusive disease     HOSPITAL COURSE:  68 year old female patient with history of type II diabetes with neuropathy, CVA, COPD, cerebellar stroke without heparin sequela and no prior history of malignancy/or PE-is currently admitted to hospital for acute shortness of breath.CT chest shows acute PE;multiple liver lesions  *Bilateral pulmonary embolism with right heart strain by CT, supported by markedly elevated troponin, with associated acute hypoxic respiratory failure, in the context of suspected metastatic disease, new diagnosis.  --Remains hypoxic, 96% on 3 L,  given extensive clot burden with right ventricular dysfunction and pulmonary HTN - vascular surgery Dr. dew  performed bilateral pulmonary thrombectomy on 01/22/2019 -Continue IV heparin drip-- transition to Lovenox  55 mg bid till pt gets her liver biopsy and then po eliquis--defer this to Dr Rogue Bussing -Appreciate pulmonology and vascular surgery involvement.  *Bilateral lower extremity DVT. --No significant symptoms.  -Vascular surgery recommends against any intervention. --Continue anticoagulation SQ Lovenox  *multiple liver lesions as noted on CT scan concerning for metastatic disease  -IR will perform biopsy on Monday  Dec 7th since patient had taken last dose of Plavix on 11/29-- sister Audrey Chan informed -patient follow-up with Dr. 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 Doy Mince of neurology. 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 Continuebronchodilators -patient will be assess for home oxygen need.  *Iron deficiency anemia appears chronic -hemoglobin 8.6 hematocrit 26.2  DVT prophylaxis: subcu Lovenox Code Status:Full Family Communication: sister Audrey Chan Disposition Plan:home   Patient will go home with home health PT RN, bedside commode, oxygen, nebulizer    CONSULTS OBTAINED:  Treatment Team:  Ottie Glazier, MD  DRUG ALLERGIES:  No Known Allergies  DISCHARGE MEDICATIONS:   Allergies as of 01/25/2019   No Known Allergies     Medication  List    STOP taking these medications   Apidra SoloStar  100 UNIT/ML Solostar Pen Generic drug: Insulin Glulisine   aspirin EC 81 MG tablet   clopidogrel 75 MG tablet Commonly known as: PLAVIX   hydrochlorothiazide 25 MG tablet Commonly known as: HYDRODIURIL   metFORMIN 500 MG 24 hr tablet Commonly known as: GLUCOPHAGE-XR     TAKE these medications   acetaminophen 500 MG tablet Commonly known as: TYLENOL Take 500-1,000 mg by mouth every 6 (six) hours as needed for moderate pain.   albuterol 108 (90 Base) MCG/ACT inhaler Commonly known as: VENTOLIN HFA Inhale 2 puffs into the lungs every 6 (six) hours as needed for wheezing or shortness of breath.   amLODipine 5 MG tablet Commonly known as: NORVASC Take 1 tablet (5 mg total) by mouth daily.   Anoro Ellipta 62.5-25 MCG/INH Aepb Generic drug: umeclidinium-vilanterol Inhale 1 puff into the lungs daily.   cetirizine 10 MG tablet Commonly known as: ZYRTEC Take 10 mg by mouth daily.   Crestor 20 MG tablet Generic drug: rosuvastatin Take 20 mg by mouth daily.   enoxaparin 60 MG/0.6ML injection Commonly known as: LOVENOX Inject 0.55 mLs (55 mg total) into the skin 2 (two) times daily for 9 days. Notes to patient: Hold your dose after you take your Sunday morning dose   ferrous sulfate 325 (65 FE) MG tablet Take 325 mg by mouth 2 (two) times daily with a meal.   Flector 1.3 % Ptch Generic drug: diclofenac Apply 1 patch topically 2 (two) times daily. Apply 1 patch twice a day.   fluticasone 50 MCG/ACT nasal spray Commonly known as: FLONASE Place 2 sprays into both nostrils daily as needed for allergies or rhinitis.   gabapentin 800 MG tablet Commonly known as: NEURONTIN Take 800 mg by mouth 3 (three) times daily.   insulin aspart 100 UNIT/ML injection Commonly known as: NovoLOG Check blood sugar prior to meals and give insulin on the following scale: Blood sugar less than 150: 0 units BS 150-200: 2 units BS  200-250: 3 units BS 250-300: 4 units BS 300-350: 5 units BS 350-400: 6 units BS above 400: 8 units and call your doctor.   ipratropium-albuterol 0.5-2.5 (3) MG/3ML Soln Commonly known as: DUONEB Take 3 mLs by nebulization every 6 (six) hours as needed.   Lantus SoloStar 100 UNIT/ML Solostar Pen Generic drug: Insulin Glargine Inject 25 Units into the skin at bedtime.   lisinopril 10 MG tablet Commonly known as: ZESTRIL Take 1 tablet (10 mg total) by mouth daily. What changed: how much to take   metoprolol tartrate 25 MG tablet Commonly known as: LOPRESSOR Take 12.5 mg by mouth 2 (two) times daily.   mometasone 0.1 % cream Commonly known as: ELOCON Apply 1 application topically 2 (two) times daily as needed (rash).   Multi-Vitamins Tabs Take 1 tablet by mouth daily.   polyethylene glycol powder 17 GM/SCOOP powder Commonly known as: GLYCOLAX/MIRALAX Take 0.5 Containers by mouth daily as needed for moderate constipation.   Vitamin D3 25 MCG (1000 UT) Caps Take 1,000 Units by mouth daily.            Durable Medical Equipment  (From admission, onward)         Start     Ordered   01/25/19 0942  For home use only DME Nebulizer machine  Once    Question Answer Comment  Patient needs a nebulizer to treat with the following condition Pulmonary emboli (HCC)   Length of Need 6 Months      12 /05/20 0941  01/25/19 0941  For home use only DME Bedside commode  Once    Question:  Patient needs a bedside commode to treat with the following condition  Answer:  Pulmonary emboli ([place])   01/25/19 0941   01/25/19 0941  For home use only DME oxygen  Once    Question Answer Comment  Length of Need Lifetime   Mode or (Route) Nasal cannula   Liters per Minute 3   Frequency Continuous (stationary and portable oxygen unit needed)   Oxygen conserving device Yes   Oxygen delivery system Gas      01/25/19 0941          If you experience worsening of your admission symptoms,  develop shortness of breath, life threatening emergency, suicidal or homicidal thoughts you must seek medical attention immediately by calling 911 or calling your MD immediately  if symptoms less severe.  You Must read complete instructions/literature along with all the possible adverse reactions/side effects for all the Medicines you take and that have been prescribed to you. Take any new Medicines after you have completely understood and accept all the possible adverse reactions/side effects.   Please note  You were cared for by a hospitalist during your hospital stay. If you have any questions about your discharge medications or the care you received while you were in the hospital after you are discharged, you can call the unit and asked to speak with the hospitalist on call if the hospitalist that took care of you is not available. Once you are discharged, your primary care physician will handle any further medical issues. Please note that NO REFILLS for any discharge medications will be authorized once you are discharged, as it is imperative that you return to your primary care physician (or establish a relationship with a primary care physician if you do not have one) for your aftercare needs so that they can reassess your need for medications and monitor your lab values. Today   SUBJECTIVE  feels better. Ate good breakfast.   VITAL SIGNS:  Blood pressure (!) 110/59, pulse 90, temperature 99.2 F (37.3 C), temperature source Oral, resp. rate 17, height 5\' 6"  (1.676 m), weight 55.3 kg, SpO2 95 %.  I/O:    Intake/Output Summary (Last 24 hours) at 01/25/2019 1001 Last data filed at 01/24/2019 1148 Gross per 24 hour  Intake 641.98 ml  Output -  Net 641.98 ml    PHYSICAL EXAMINATION:  GENERAL:  68 y.o.-year-old patient lying in the bed with no acute distress.  EYES: Pupils equal, round, reactive to light and accommodation. No scleral icterus. Extraocular muscles intact.  HEENT: Head  atraumatic, normocephalic. Oropharynx and nasopharynx clear.  NECK:  Supple, no jugular venous distention. No thyroid enlargement, no tenderness.  LUNGS: Normal breath sounds bilaterally, no wheezing, rales,rhonchi or crepitation. No use of accessory muscles of respiration.  CARDIOVASCULAR: S1, S2 normal. No murmurs, rubs, or gallops.  ABDOMEN: Soft, non-tender, non-distended. Bowel sounds present. No organomegaly or mass.  EXTREMITIES: No pedal edema, cyanosis, or clubbing.  NEUROLOGIC: Cranial nerves II through XII are intact. Muscle strength 5/5 in all extremities. Sensation intact. Gait not checked.  PSYCHIATRIC: The patient is alert and oriented x 3.  SKIN: No obvious rash, lesion, or ulcer.   DATA REVIEW:   CBC  Recent Labs  Lab 01/25/19 0627  WBC 15.1*  HGB 8.8*  HCT 27.5*  PLT 268    Chemistries  Recent Labs  Lab 01/22/19 1631  01/25/19 0627  NA 137   < >  139  K 4.5   < > 3.9  CL 110   < > 110  CO2 19*   < > 21*  GLUCOSE 221*   < > 159*  BUN 17   < > 13  CREATININE 1.33*   < > 1.32*  CALCIUM 8.5*   < > 8.7*  MG  --   --  1.7  AST 311*  --   --   ALT 122*  --   --   ALKPHOS 1,059*  --   --   BILITOT 0.9  --   --    < > = values in this interval not displayed.    Microbiology Results   Recent Results (from the past 240 hour(s))  Urine culture     Status: Abnormal   Collection Time: 01/19/19 11:25 PM   Specimen: In/Out Cath Urine  Result Value Ref Range Status   Specimen Description   Final    IN/OUT CATH URINE Performed at [facility], [address] [place], [place] [address]    Special Requests   Final    NONE Performed at [facility], [address] [place], [place] [address]    Culture MULTIPLE SPECIES PRESENT, SUGGEST RECOLLECTION (A)  Final   Report Status 01/21/2019 FINAL  Final  Blood Culture (routine x 2)     Status: None   Collection Time: 01/20/19  1:16 AM   Specimen: BLOOD  Result Value Ref Range Status    Specimen Description BLOOD LEFT WRIST  Final   Special Requests   Final    BOTTLES DRAWN AEROBIC AND ANAEROBIC Blood Culture results may not be optimal due to an excessive volume of blood received in culture bottles   Culture   Final    NO GROWTH 5 DAYS Performed at [facility], [address] [place], [place] [address]    Report Status 01/25/2019 FINAL  Final  Blood Culture (routine x 2)     Status: None   Collection Time: 01/20/19  1:16 AM   Specimen: BLOOD  Result Value Ref Range Status   Specimen Description BLOOD RIGTH FA  Final   Special Requests   Final    BOTTLES DRAWN AEROBIC AND ANAEROBIC Blood Culture results may not be optimal due to an excessive volume of blood received in culture bottles   Culture   Final    NO GROWTH 5 DAYS Performed at [facility], [address] [place], [place] [address]    Report Status 01/25/2019 FINAL  Final  SARS CORONAVIRUS 2 (TAT 6-24 HRS) Nasopharyngeal Nasopharyngeal Swab     Status: None   Collection Time: 01/20/19  1:16 AM   Specimen: Nasopharyngeal Swab  Result Value Ref Range Status   SARS Coronavirus 2 NEGATIVE NEGATIVE Final    Comment: (NOTE) SARS-CoV-2 target nucleic acids are NOT DETECTED. The SARS-CoV-2 RNA is generally detectable in upper and lower respiratory specimens during the acute phase of infection. Negative results do not preclude SARS-CoV-2 infection, do not rule out co-infections with other pathogens, and should not be used as the sole basis for treatment or other patient management decisions. Negative results must be combined with clinical observations, patient history, and epidemiological information. The expected result is Negative. Fact Sheet for Patients: SugarRoll.be Fact Sheet for Healthcare Providers: https://www.woods-mathews.com/ This test is not yet approved or cleared by the [place] FDA and  has been authorized for detection and/or  diagnosis of SARS-CoV-2 by FDA under an Emergency  Use Authorization (EUA). This EUA will remain  in effect (meaning this test can be used) for the duration of the COVID-19 declaration under Section 56 4(b)(1) of the Act, 21 U.S.C. section 360bbb-3(b)(1), unless the authorization is terminated or revoked sooner. Performed at [facility], [place] [address] [place], [place] [address]     RADIOLOGY:  Dg Chest Port 1 View  Result Date: 01/23/2019 CLINICAL DATA:  Hypertension, pulmonary emboli EXAM: PORTABLE CHEST 1 VIEW COMPARISON:  01/20/2019, 01/19/2019 FINDINGS: The heart size and mediastinal contours are stable. No new focal airspace consolidation, pleural effusion, or pneumothorax. Lungs are mildly hyperexpanded. The visualized skeletal structures are unremarkable. IMPRESSION: Stable appearance of the chest without new airspace opacity. Electronically Signed   By: Davina Poke M.D.   On: 01/23/2019 12:54     CODE STATUS:     Code Status Orders  (From admission, onward)         Start     Ordered   01/20/19 0345  Full code  Continuous     01/20/19 0347        Code Status History    Date Active Date Inactive Code Status Order ID Comments User Context   12/30/2018 0004 01/03/2019 1650 Full Code [id number]  Orene Desanctis, DO ED   Advance Care Planning Activity       TOTAL TIME TAKING CARE OF THIS PATIENT: *40* minutes.    [name] M.D on 01/25/2019 at 10:01 AM  Between 7am to 6pm - Pager - [phone] After 6pm go to [email or web address] - password [email or web address]  [facility]    CC: Primary care physician; [place], Elyse Jarvis, MD

## 2019-01-27 ENCOUNTER — Other Ambulatory Visit: Payer: Self-pay

## 2019-01-27 ENCOUNTER — Ambulatory Visit
Admission: RE | Admit: 2019-01-27 | Discharge: 2019-01-27 | Disposition: A | Discharge status: Home / Self Care | Payer: Medicare PPO | Source: Ambulatory Visit | Attending: Internal Medicine | Admitting: Internal Medicine

## 2019-01-27 ENCOUNTER — Other Ambulatory Visit: Payer: Self-pay | Admitting: Internal Medicine

## 2019-01-27 DIAGNOSIS — C229 Malignant neoplasm of liver, not specified as primary or secondary: Secondary | ICD-10-CM | POA: Insufficient documentation

## 2019-01-27 DIAGNOSIS — Z7901 Long term (current) use of anticoagulants: Secondary | ICD-10-CM | POA: Diagnosis not present

## 2019-01-27 DIAGNOSIS — K769 Liver disease, unspecified: Secondary | ICD-10-CM | POA: Diagnosis present

## 2019-01-27 DIAGNOSIS — Z87891 Personal history of nicotine dependence: Secondary | ICD-10-CM | POA: Diagnosis not present

## 2019-01-27 DIAGNOSIS — C787 Secondary malignant neoplasm of liver and intrahepatic bile duct: Secondary | ICD-10-CM

## 2019-01-27 DIAGNOSIS — Z801 Family history of malignant neoplasm of trachea, bronchus and lung: Secondary | ICD-10-CM | POA: Diagnosis not present

## 2019-01-27 DIAGNOSIS — Z86718 Personal history of other venous thrombosis and embolism: Secondary | ICD-10-CM | POA: Diagnosis not present

## 2019-01-27 DIAGNOSIS — E1140 Type 2 diabetes mellitus with diabetic neuropathy, unspecified: Secondary | ICD-10-CM | POA: Diagnosis not present

## 2019-01-27 DIAGNOSIS — K219 Gastro-esophageal reflux disease without esophagitis: Secondary | ICD-10-CM | POA: Diagnosis not present

## 2019-01-27 DIAGNOSIS — Z79899 Other long term (current) drug therapy: Secondary | ICD-10-CM | POA: Diagnosis not present

## 2019-01-27 DIAGNOSIS — Z794 Long term (current) use of insulin: Secondary | ICD-10-CM | POA: Diagnosis not present

## 2019-01-27 DIAGNOSIS — I10 Essential (primary) hypertension: Secondary | ICD-10-CM | POA: Diagnosis not present

## 2019-01-27 LAB — CBC
HCT: 30.1 % — ABNORMAL LOW (ref 36.0–46.0)
Hemoglobin: 9.7 g/dL — ABNORMAL LOW (ref 12.0–15.0)
MCH: 23.2 pg — ABNORMAL LOW (ref 26.0–34.0)
MCHC: 32.2 g/dL (ref 30.0–36.0)
MCV: 72 fL — ABNORMAL LOW (ref 80.0–100.0)
Platelets: 263 10*3/uL (ref 150–400)
RBC: 4.18 MIL/uL (ref 3.87–5.11)
RDW: 15.8 % — ABNORMAL HIGH (ref 11.5–15.5)
WBC: 11.1 10*3/uL — ABNORMAL HIGH (ref 4.0–10.5)
nRBC: 0 % (ref 0.0–0.2)

## 2019-01-27 LAB — PROTIME-INR
INR: 1.2 (ref 0.8–1.2)
Prothrombin Time: 14.9 seconds (ref 11.4–15.2)

## 2019-01-27 LAB — BASIC METABOLIC PANEL
Anion gap: 10 (ref 5–15)
BUN: 12 mg/dL (ref 8–23)
CO2: 23 mmol/L (ref 22–32)
Calcium: 8.9 mg/dL (ref 8.9–10.3)
Chloride: 111 mmol/L (ref 98–111)
Creatinine, Ser: 1.31 mg/dL — ABNORMAL HIGH (ref 0.44–1.00)
GFR calc Af Amer: 48 mL/min — ABNORMAL LOW (ref >60)
GFR calc non Af Amer: 41 mL/min — ABNORMAL LOW (ref >60)
Glucose, Bld: 53 mg/dL — ABNORMAL LOW (ref 70–99)
Potassium: 4.5 mmol/L (ref 3.5–5.1)
Sodium: 144 mmol/L (ref 135–145)

## 2019-01-27 LAB — GLUCOSE, CAPILLARY
Glucose-Capillary: 33 mg/dL — CL (ref 70–99)
Glucose-Capillary: 110 mg/dL — ABNORMAL HIGH (ref 70–99)
Glucose-Capillary: 92 mg/dL (ref 70–99)

## 2019-01-27 LAB — APTT: aPTT: 40 seconds — ABNORMAL HIGH (ref 24–36)

## 2019-01-27 MED ORDER — DEXTROSE 50 % IV SOLN
1.0000 | Freq: Once | INTRAVENOUS | Status: AC
  Administered 2019-01-27: 10:00:00 50 mL via INTRAVENOUS
  Filled 2019-01-27: qty 50

## 2019-01-27 MED ORDER — MIDAZOLAM HCL 2 MG/2ML IJ SOLN
INTRAMUSCULAR | Status: AC | PRN
  Administered 2019-01-27: 2 mg via INTRAVENOUS

## 2019-01-27 MED ORDER — DEXTROSE 50 % IV SOLN
INTRAVENOUS | Status: AC
  Filled 2019-01-27: qty 50

## 2019-01-27 MED ORDER — FENTANYL CITRATE (PF) 100 MCG/2ML IJ SOLN
INTRAMUSCULAR | Status: AC
  Filled 2019-01-27: qty 2

## 2019-01-27 MED ORDER — MIDAZOLAM HCL 5 MG/5ML IJ SOLN
INTRAMUSCULAR | Status: AC
  Filled 2019-01-27: qty 5

## 2019-01-27 MED ORDER — FENTANYL CITRATE (PF) 100 MCG/2ML IJ SOLN
INTRAMUSCULAR | Status: AC | PRN
  Administered 2019-01-27: 25 ug via INTRAVENOUS

## 2019-01-27 MED ORDER — SODIUM CHLORIDE 0.9 % IV SOLN
INTRAVENOUS | Status: DC
  Administered 2019-01-27: 09:00:00 via INTRAVENOUS

## 2019-01-27 NOTE — Discharge Instructions (Signed)
Liver Biopsy, Care After  These instructions give you information on caring for yourself after your procedure. Your doctor may also give you more specific instructions. Call your doctor if you have any problems or questions after your procedure.  What can I expect after the procedure?  After the procedure, it is common to have:  · Pain and soreness where the biopsy was done.  · Bruising around the area where the biopsy was done.  · Sleepiness and be tired for a few days.  Follow these instructions at home:  Medicines  · Take over-the-counter and prescription medicines only as told by your doctor.  · If you were prescribed an antibiotic medicine, take it as told by your doctor. Do not stop taking the antibiotic even if you start to feel better.  · Do not take medicines such as aspirin and ibuprofen. These medicines can thin your blood. Do not take these medicines unless your doctor tells you to take them.  · If you are taking prescription pain medicine, take actions to prevent or treat constipation. Your doctor may recommend that you:  ? Drink enough fluid to keep your pee (urine) clear or pale yellow.  ? Take over-the-counter or prescription medicines.  ? Eat foods that are high in fiber, such as fresh fruits and vegetables, whole grains, and beans.  ? Limit foods that are high in fat and processed sugars, such as fried and sweet foods.  Caring for your cut  · Follow instructions from your doctor about how to take care of your cuts from surgery (incisions). Make sure you:  ? Wash your hands with soap and water before you change your bandage (dressing). If you cannot use soap and water, use hand sanitizer.  ? Change your bandage as told by your doctor.  ? Leave stitches (sutures), skin glue, or skin tape (adhesive) strips in place. They may need to stay in place for 2 weeks or longer. If tape strips get loose and curl up, you may trim the loose edges. Do not remove tape strips completely unless your doctor says it is  okay.  · Check your cuts every day for signs of infection. Check for:  ? Redness, swelling, or more pain.  ? Fluid or blood.  ? Pus or a bad smell.  ? Warmth.  · Do not take baths, swim, or use a hot tub until your doctor says it is okay to do so.  Activity    · Rest at home for 1-2 days or as told by your doctor.  ? Avoid sitting for a long time without moving. Get up to take short walks every 1-2 hours.  · Return to your normal activities as told by your doctor. Ask what activities are safe for you.  · Do not do these things in the first 24 hours:  ? Drive.  ? Use machinery.  ? Take a bath or shower.  · Do not lift more than 10 pounds (4.5 kg) or play contact sports for the first 2 weeks.  General instructions    · Do not drink alcohol in the first week after the procedure.  · Have someone stay with you for at least 24 hours after the procedure.  · Get your test results. Ask your doctor or the department that is doing the test:  ? When will my results be ready?  ? How will I get my results?  ? What are my treatment options?  ? What other tests do   I need?  ? What are my next steps?  · Keep all follow-up visits as told by your doctor. This is important.  Contact a doctor if:  · A cut bleeds and leaves more than just a small spot of blood.  · A cut is red, puffs up (swells), or hurts more than before.  · Fluid or something else comes from a cut.  · A cut smells bad.  · You have a fever or chills.  Get help right away if:  · You have swelling, bloating, or pain in your belly (abdomen).  · You get dizzy or faint.  · You have a rash.  · You feel sick to your stomach (nauseous) or throw up (vomit).  · You have trouble breathing, feel short of breath, or feel faint.  · Your chest hurts.  · You have problems talking or seeing.  · You have trouble with your balance or moving your arms or legs.  Summary  · After the procedure, it is common to have pain, soreness, bruising, and tiredness.  · Your doctor will tell you how to  take care of yourself at home. Change your bandage, take your medicines, and limit your activities as told by your doctor.  · Call your doctor if you have symptoms of infection. Get help right away if your belly swells, your cut bleeds a lot, or you have trouble talking or breathing.  This information is not intended to replace advice given to you by your health care provider. Make sure you discuss any questions you have with your health care provider.  Document Released: 11/16/2007 Document Revised: 02/16/2017 Document Reviewed: 02/16/2017  [employer] Patient Education © 2020 [employer]

## 2019-01-27 NOTE — H&P (Signed)
Chief Complaint: Patient was seen in consultation today for No chief complaint on file.  at the request of Audrey Chan,Audrey Chan  Referring Physician(s): Audrey Chan   Supervising Physician:Register    History of Present Illness: Audrey Chan is a 68 y.o. female  With multiple liver lesions.Needs CT Bx.  Past Medical History:  Diagnosis Date  . Allergic rhinitis   . Aneurysm of unspecified site ([place])   . Cholelithiasis   . Diabetes type 2, controlled ([place])   . GERD (gastroesophageal reflux disease)   . Hypertension   . Leg DVT (deep venous thromboembolism), acute, bilateral ([place]) 01/21/2019  . Multiple subsegmental pulmonary emboli without acute cor pulmonale ([place]) 01/20/2019  . Neuropathy   . Veno-occlusive disease     Past Surgical History:  Procedure Laterality Date  . ABDOMINAL HYSTERECTOMY  1972  . CERVICAL FUSION    . CHOLECYSTECTOMY N/A 12/28/2014   Procedure: LAPAROSCOPIC CHOLECYSTECTOMY;  Surgeon: Audrey Rad, MD;  Location: [facility] ORS;  Service: General;  Laterality: N/A;  . EYE SURGERY     detached retina  . MASS EXCISION Left 05/06/2018   Procedure: EXCISION OF LEFT ANTECUBITAL CYST, DIABETIC;  Surgeon: Audrey Epley, MD;  Location: [facility] OR[facility];  Service: Vascular;  Laterality: Left;  . PULMONARY THROMBECTOMY Bilateral 01/22/2019   Procedure: PULMONARY THROMBECTOMY;  Surgeon: Audrey Huxley, MD;  Location: [facility];  Service: Cardiovascular;  Laterality: Bilateral;    Allergies: Patient has no known allergies.  Medications: Prior to Admission medications   Medication Sig Start Date End Date Taking? Authorizing Provider  acetaminophen (TYLENOL) 500 MG tablet Take 500-1,000 mg by mouth every 6 (six) hours as needed for moderate pain.    Yes [provider]  albuterol (PROVENTIL HFA;VENTOLIN HFA) 108 (90 BASE) MCG/ACT inhaler Inhale 2 puffs into the lungs every 6 (six) hours as needed for wheezing or shortness of breath.    Yes  [provider]  amLODipine (NORVASC) 5 MG tablet Take 1 tablet (5 mg total) by mouth daily. 01/04/19  Yes Chan, Nishant, MD  cetirizine (ZYRTEC) 10 MG tablet Take 10 mg by mouth daily.    Yes [provider]  Cholecalciferol (VITAMIN D3) 1000 units CAPS Take 1,000 Units by mouth daily.    Yes [provider]  diclofenac (FLECTOR) 1.3 % PTCH Apply 1 patch topically 2 (two) times daily. Apply 1 patch twice a day.   Yes [provider]  enoxaparin (LOVENOX) 60 MG/0.6ML injection Inject 0.55 mLs (55 mg total) into the skin 2 (two) times daily for 9 days. 01/25/19 02/03/19 Yes Audrey Mandes, MD  ferrous sulfate 325 (65 FE) MG tablet Take 325 mg by mouth 2 (two) times daily with a meal.    Yes [provider]  fluticasone (FLONASE) 50 MCG/ACT nasal spray Place 2 sprays into both nostrils daily as needed for allergies or rhinitis.    Yes [provider]  gabapentin (NEURONTIN) 800 MG tablet Take 800 mg by mouth 3 (three) times daily.  12/26/16  Yes [provider]  insulin aspart (NOVOLOG) 100 UNIT/ML injection Check blood sugar prior to meals and give insulin on the following scale: Blood sugar less than 150: 0 units BS 150-200: 2 units BS 200-250: 3 units BS 250-300: 4 units BS 300-350: 5 units BS 350-400: 6 units BS above 400: 8 units and call your doctor. 01/07/19  Yes Audrey Mew, MD  ipratropium-albuterol (DUONEB) 0.5-2.5 (3) MG/3ML SOLN Take 3 mLs by nebulization every 6 (  six) hours as needed. 01/25/19  Yes Audrey Mandes, MD  LANTUS SOLOSTAR 100 UNIT/ML Solostar Pen Inject 25 Units into the skin at bedtime. 01/07/19  Yes Audrey Mew, MD  metoprolol tartrate (LOPRESSOR) 25 MG tablet Take 12.5 mg by mouth 2 (two) times daily.   Yes [provider]  mometasone (ELOCON) 0.1 % cream Apply 1 application topically 2 (two) times daily as needed (rash).  10/08/17  Yes [provider]  Multiple Vitamin  (MULTI-VITAMINS) TABS Take 1 tablet by mouth daily.   Yes [provider]  polyethylene glycol powder (GLYCOLAX/MIRALAX) powder Take 0.5 Containers by mouth daily as needed for moderate constipation.  10/08/15  Yes [provider]  rosuvastatin (CRESTOR) 20 MG tablet Take 20 mg by mouth daily.  05/02/13  Yes [provider]  umeclidinium-vilanterol (ANORO ELLIPTA) 62.5-25 MCG/INH AEPB Inhale 1 puff into the lungs daily.   Yes [provider]  lisinopril (ZESTRIL) 10 MG tablet Take 1 tablet (10 mg total) by mouth daily. 01/25/19   Audrey Mandes, MD     Family History  Problem Relation Age of Onset  . Lung cancer Father     Social History   Socioeconomic History  . Marital status: Widowed    Spouse name: Not on file  . Number of children: 1  . Years of education: Not on file  . Highest education level: Not on file  Occupational History  . Not on file  Social Needs  . Financial resource strain: Not on file  . Food insecurity    Worry: Not on file    Inability: Not on file  . Transportation needs    Medical: Not on file    Non-medical: Not on file  Tobacco Use  . Smoking status: Former Smoker    Quit date: 02/21/1999    Years since quitting: 19.9  . Smokeless tobacco: Never Used  Substance and Sexual Activity  . Alcohol use: No    Alcohol/week: 0.0 standard drinks  . Drug use: No  . Sexual activity: Not on file  Lifestyle  . Physical activity    Days per week: Not on file    Minutes per session: Not on file  . Stress: Not on file  Relationships  . Social Herbalist on phone: Not on file    Gets together: Not on file    Attends religious service: Not on file    Active member of club or organization: Not on file    Attends meetings of clubs or organizations: Not on file    Relationship status: Not on file  Other Topics Concern  . Not on file  Social History Narrative  . Not on file     Review of Systems: A 12 point ROS  discussed and pertinent positives are indicated in the HPI above.  All other systems are negative.  Review of Systems  Vital Signs: BP (!) 143/85   Pulse 97   Temp 98.1 F (36.7 C) (Oral)   Resp 20   Ht 5\' 6"  (1.676 m)   Wt 54.4 kg   SpO2 93%   BMI 19.37 kg/m   Physical ExamPt alert and Ox3.Hent-clear.Chest clear.Abd benign.  Imaging: Ct Head Wo Contrast  Result Date: 12/31/2018 CLINICAL DATA:  Seizure EXAM: CT HEAD WITHOUT CONTRAST TECHNIQUE: Contiguous axial images were obtained from the base of the skull through the vertex without intravenous contrast. COMPARISON:  None. FINDINGS: Brain: There is no acute intracranial hemorrhage, mass-effect, or edema.  There is no new loss of gray-white differentiation. Confluent areas of hypoattenuation in the supratentorial white matter are nonspecific but likely reflect stable advanced microvascular ischemic changes. Chronic small infarcts of the left cerebellum and thalamus again noted. There is no extra-axial collection. Ventricles are stable in size. Vascular: No hyperdense vessel. Skull: Calvarium is unremarkable. Sinuses/Orbits: Mild paranasal sinus mucosal thickening. Orbits are unremarkable. Other: Mastoid air cells are clear. IMPRESSION: No acute or new findings since December 29, 2018. Stable chronic findings detailed above. Electronically Signed   By: Macy Mis M.D.   On: 12/31/2018 13:15   Ct Head Wo Contrast  Result Date: 12/29/2018 CLINICAL DATA:  Loss of consciousness EXAM: CT HEAD WITHOUT CONTRAST TECHNIQUE: Contiguous axial images were obtained from the base of the skull through the vertex without intravenous contrast. COMPARISON:  None. FINDINGS: Brain: No evidence of acute territorial infarction, hemorrhage, hydrocephalus,extra-axial collection or mass lesion/mass effect. The ventricles are normal in size and contour. Low-attenuation changes in the deep white matter consistent with small vessel ischemia. Vascular: No hyperdense  vessel or unexpected calcification. Skull: The skull is intact. No fracture or focal lesion identified. Sinuses/Orbits: The visualized paranasal sinuses and mastoid air cells are clear. The orbits and globes intact. Other: None IMPRESSION: No acute intracranial abnormality. Findings consistent with chronic small vessel ischemia Electronically Signed   By: Prudencio Pair M.D.   On: 12/29/2018 23:08   Ct Angio Chest Pe W/cm &/or Wo Cm  Result Date: 01/20/2019 CLINICAL DATA:  Suspected covid, elevated LFTs, recent stroke EXAM: CT ANGIOGRAPHY CHEST, abdomen, and pelvis WITH CONTRAST TECHNIQUE: Multidetector CT imaging of the chest, abdomen, and pelvis was performed using the standard protocol during bolus administration of intravenous contrast. Multiplanar CT image reconstructions and MIPs were obtained to evaluate the vascular anatomy. CONTRAST:  65mL OMNIPAQUE IOHEXOL 350 MG/ML SOLN COMPARISON:  CT chest September 13, 2017 FINDINGS: Cardiovascular: There is a optimal opacification of the pulmonary arteries. There is a small nonocclusive thrombus seen within the left main pulmonary artery which extends into the anterior left upper lobe segmental branches and partially occlusive thrombus in the posterior left lower lobe subsegmental branches. There is also partially occlusive thrombus seen within the posterior right upper lobe segmental branch, series 6, image 37. There is also partially occlusive thrombus seen extending into the right middle lobe subsegmental branches and posterior right lower lobe subsegmental branches. The heart is normal in size. No pericardial effusion or thickening. There is slight straightening of the interventricular septum which could be due to early right ventricular heart strain. There is normal three-vessel brachiocephalic anatomy without proximal stenosis. Scattered atherosclerosis is noted. The thoracic aorta is normal in appearance. Mediastinum/Nodes: No hilar, mediastinal, or axillary  adenopathy. Thyroid gland, trachea, and esophagus demonstrate no significant findings. Lungs/Pleura: Small subcentimeter pulmonary nodule are again identified as on prior exam dating back to 2019, for example in the posterior right upper lobe there is a 5 mm pulmonary nodule, series 8, image 21 and anterior right lower lobe, series 8, image 54 a 4 mm pulmonary nodule. There is however a new small nodular opacity in the posterior left upper lobe, series 8, image 47 measuring 7 mm not seen on prior exam. No large airspace consolidation or pleural effusion is seen. Centrilobular emphysematous changes seen within both lung apices. Musculoskeletal: No chest wall abnormality. No acute or significant osseous findings. Review of the MIP images confirms the above findings. Abdomen/pelvis: Hepatobiliary: There are multiple low-density liver lesions seen throughout which were not  seen on the prior exam. There is also heterogeneous enhancement seen throughout the liver parenchyma. Central periportal edema is noted. The portal vein is patent. The patient is status post cholecystectomy. A small amount of perihepatic ascites is noted. Pancreas: Unremarkable. No pancreatic ductal dilatation or surrounding inflammatory changes. Spleen: Normal in size without focal abnormality. Adrenals/Urinary Tract: Both adrenal glands appear normal. The kidneys and collecting system appear normal without evidence of urinary tract calculus or hydronephrosis. Bladder is unremarkable. Stomach/Bowel: The stomach, small bowel, and colon are normal in appearance. No inflammatory changes, wall thickening, or obstructive findings. Vascular/Lymphatic: There are no enlarged mesenteric, retroperitoneal, or pelvic lymph nodes. Scattered aortic atherosclerotic calcifications are seen without aneurysmal dilatation. Reproductive: The uterus and adnexa are unremarkable. Other: No evidence of abdominal wall mass or hernia. Musculoskeletal: No acute or significant  osseous findings. IMPRESSION: 1. Bilateral partially occlusive segmental and subsegmental pulmonary emboli as described above. 2. Findings which could be suggestive of early right ventricular heart strain. 3. Multiple unchanged subcentimeter pulmonary nodules as on prior exam dating back to 2019. 4. New nodular opacity within the posterior left upper lobe measuring 7 mm which is nonspecific, and would recommend attention on short interval follow-up exam. 5. Centrilobular emphysematous changes. 6. Numerous hepatic lesions throughout, likely suggestive of hepatic metastatic disease. 7. Small amount of perihepatic ascites 8. These results were called by telephone at the time of interpretation on 01/20/2019 at 2:07 am to provider [facility] , who verbally acknowledged these results. Electronically Signed   By: Prudencio Pair M.D.   On: 01/20/2019 02:09   Mr Jeri Cos [id number] Contrast  Result Date: 01/01/2019 CLINICAL DATA:  Encephalopathy.  DKA. EXAM: MRI HEAD WITHOUT AND WITH CONTRAST TECHNIQUE: Multiplanar, multiecho pulse sequences of the brain and surrounding structures were obtained without and with intravenous contrast. CONTRAST:  9mL GADAVIST GADOBUTROL 1 MMOL/ML IV SOLN COMPARISON:  CT head 12/31/2018 FINDINGS: Brain: Small acute infarct left inferior cerebellum. No other acute infarct Chronic infarct left inferior cerebellum. Extensive white matter disease bilaterally. Diffuse subcortical and deep white matter hyperintensity throughout both cerebral hemispheres. Hyperintensity is also noted in the right thalamus. Negative for hemorrhage or mass. Ventricle size normal. Normal enhancement postcontrast administration Vascular: Normal arterial flow voids Skull and upper cervical spine: No focal bony abnormality. Cervical spine fusion hardware. Sinuses/Orbits: Negative Other: None IMPRESSION: 1. Acute small infarct left medial cerebellum. 2. Extensive disease throughout the white matter most compatible with chronic  ischemia. Chronic infarct left cerebellum. Electronically Signed   By: Franchot Gallo M.D.   On: 01/01/2019 15:05   Ct Abd/pelvis W/ Iv Contrast  Result Date: 01/20/2019 CLINICAL DATA:  Suspected covid, elevated LFTs, recent stroke EXAM: CT ANGIOGRAPHY CHEST, abdomen, and pelvis WITH CONTRAST TECHNIQUE: Multidetector CT imaging of the chest, abdomen, and pelvis was performed using the standard protocol during bolus administration of intravenous contrast. Multiplanar CT image reconstructions and MIPs were obtained to evaluate the vascular anatomy. CONTRAST:  34mL OMNIPAQUE IOHEXOL 350 MG/ML SOLN COMPARISON:  CT chest September 13, 2017 FINDINGS: Cardiovascular: There is a optimal opacification of the pulmonary arteries. There is a small nonocclusive thrombus seen within the left main pulmonary artery which extends into the anterior left upper lobe segmental branches and partially occlusive thrombus in the posterior left lower lobe subsegmental branches. There is also partially occlusive thrombus seen within the posterior right upper lobe segmental branch, series 6, image 37. There is also partially occlusive thrombus seen extending into the right middle lobe subsegmental branches and  posterior right lower lobe subsegmental branches. The heart is normal in size. No pericardial effusion or thickening. There is slight straightening of the interventricular septum which could be due to early right ventricular heart strain. There is normal three-vessel brachiocephalic anatomy without proximal stenosis. Scattered atherosclerosis is noted. The thoracic aorta is normal in appearance. Mediastinum/Nodes: No hilar, mediastinal, or axillary adenopathy. Thyroid gland, trachea, and esophagus demonstrate no significant findings. Lungs/Pleura: Small subcentimeter pulmonary nodule are again identified as on prior exam dating back to 2019, for example in the posterior right upper lobe there is a 5 mm pulmonary nodule, series 8, image  21 and anterior right lower lobe, series 8, image 54 a 4 mm pulmonary nodule. There is however a new small nodular opacity in the posterior left upper lobe, series 8, image 47 measuring 7 mm not seen on prior exam. No large airspace consolidation or pleural effusion is seen. Centrilobular emphysematous changes seen within both lung apices. Musculoskeletal: No chest wall abnormality. No acute or significant osseous findings. Review of the MIP images confirms the above findings. Abdomen/pelvis: Hepatobiliary: There are multiple low-density liver lesions seen throughout which were not seen on the prior exam. There is also heterogeneous enhancement seen throughout the liver parenchyma. Central periportal edema is noted. The portal vein is patent. The patient is status post cholecystectomy. A small amount of perihepatic ascites is noted. Pancreas: Unremarkable. No pancreatic ductal dilatation or surrounding inflammatory changes. Spleen: Normal in size without focal abnormality. Adrenals/Urinary Tract: Both adrenal glands appear normal. The kidneys and collecting system appear normal without evidence of urinary tract calculus or hydronephrosis. Bladder is unremarkable. Stomach/Bowel: The stomach, small bowel, and colon are normal in appearance. No inflammatory changes, wall thickening, or obstructive findings. Vascular/Lymphatic: There are no enlarged mesenteric, retroperitoneal, or pelvic lymph nodes. Scattered aortic atherosclerotic calcifications are seen without aneurysmal dilatation. Reproductive: The uterus and adnexa are unremarkable. Other: No evidence of abdominal wall mass or hernia. Musculoskeletal: No acute or significant osseous findings. IMPRESSION: 1. Bilateral partially occlusive segmental and subsegmental pulmonary emboli as described above. 2. Findings which could be suggestive of early right ventricular heart strain. 3. Multiple unchanged subcentimeter pulmonary nodules as on prior exam dating back to  2019. 4. New nodular opacity within the posterior left upper lobe measuring 7 mm which is nonspecific, and would recommend attention on short interval follow-up exam. 5. Centrilobular emphysematous changes. 6. Numerous hepatic lesions throughout, likely suggestive of hepatic metastatic disease. 7. Small amount of perihepatic ascites 8. These results were called by telephone at the time of interpretation on 01/20/2019 at 2:07 am to provider [facility] , who verbally acknowledged these results. Electronically Signed   By: Prudencio Pair M.D.   On: 01/20/2019 02:09   US Venous Img Lower Bilateral (dvt)  Result Date: 01/20/2019 CLINICAL DATA:  History of pulmonary embolism, now with lower extremity pain. Evaluate for DVT. EXAM: BILATERAL LOWER EXTREMITY VENOUS DOPPLER ULTRASOUND TECHNIQUE: Gray-scale sonography with graded compression, as well as color Doppler and duplex ultrasound were performed to evaluate the lower extremity deep venous systems from the level of the common femoral vein and including the common femoral, femoral, profunda femoral, popliteal and calf veins including the posterior tibial, peroneal and gastrocnemius veins when visible. The superficial great saphenous vein was also interrogated. Spectral Doppler was utilized to evaluate flow at rest and with distal augmentation maneuvers in the common femoral, femoral and popliteal veins. COMPARISON:  Chest CTA-01/20/2019 FINDINGS: RIGHT LOWER EXTREMITY Common Femoral Vein: No evidence of thrombus.  Normal compressibility, respiratory phasicity and response to augmentation. Saphenofemoral Junction: No evidence of thrombus. Normal compressibility and flow on color Doppler imaging. Profunda Femoral Vein: No evidence of thrombus. Normal compressibility and flow on color Doppler imaging. Femoral Vein: No evidence of thrombus. Normal compressibility, respiratory phasicity and response to augmentation. Popliteal Vein: No evidence of thrombus. Normal  compressibility, respiratory phasicity and response to augmentation. Calf Veins: There is hypoechoic occlusive thrombus within both paired right peroneal veins (images 58 and 59). Both paired right posterior tibial veins appear patent where imaged. Superficial Great Saphenous Vein: No evidence of thrombus. Normal compressibility. Other Findings:  None. _________________________________________________________ LEFT LOWER EXTREMITY Common Femoral Vein: No evidence of thrombus. Normal compressibility, respiratory phasicity and response to augmentation. Saphenofemoral Junction: No evidence of thrombus. Normal compressibility and flow on color Doppler imaging. Profunda Femoral Vein: No evidence of thrombus. Normal compressibility and flow on color Doppler imaging. Femoral Vein: No evidence of thrombus. Normal compressibility, respiratory phasicity and response to augmentation. Popliteal Vein: No evidence of thrombus. Normal compressibility, respiratory phasicity and response to augmentation. Calf Veins: There is hypoechoic occlusive thrombus within 1 of the paired left peroneal veins (images 29 and 30). The adjacent paired left peroneal vein as well though both paired posterior tibial veins appear patent. Superficial Great Saphenous Vein: No evidence of thrombus. Normal compressibility. Other Findings:  None. IMPRESSION: Examination is positive for occlusive DVT involving both paired right peroneal veins as well as one of two paired left peroneal veins. There is no extension of this distal tibial DVT to the more proximal venous system of either lower extremity. Electronically Signed   By: [name] Mariscal M.D.   On: 01/20/2019 17:02   Dg Chest Port 1 View  Result Date: 01/23/2019 CLINICAL DATA:  Hypertension, pulmonary emboli EXAM: PORTABLE CHEST 1 VIEW COMPARISON:  01/20/2019, 01/19/2019 FINDINGS: The heart size and mediastinal contours are stable. No new focal airspace consolidation, pleural effusion, or pneumothorax.  Lungs are mildly hyperexpanded. The visualized skeletal structures are unremarkable. IMPRESSION: Stable appearance of the chest without new airspace opacity. Electronically Signed   By: Davina Poke M.D.   On: 01/23/2019 12:54   Dg Chest Portable 1 View  Result Date: 01/19/2019 CLINICAL DATA:  Shortness of breath EXAM: PORTABLE CHEST 1 VIEW COMPARISON:  12/31/2018 FINDINGS: Cardiomegaly. Lungs clear. No effusions or edema. No acute bony abnormality. IMPRESSION: Cardiomegaly.  No active disease. Electronically Signed   By: Rolm Baptise M.D.   On: 01/19/2019 23:33   Dg Chest Port 1 View  Result Date: 12/31/2018 CLINICAL DATA:  Sepsis EXAM: PORTABLE CHEST 1 VIEW COMPARISON:  December 29, 2018 which is FINDINGS: The heart size and mediastinal contours are unchanged. Again noted is prominence to the central pulmonary vasculature. There is mildly increased interstitial markings seen at both lower lungs slightly more pronounced than the prior exam. No pleural effusion. No acute osseous abnormality. IMPRESSION: Pulmonary vascular congestion and mildly increased interstitial markings at both lower lungs, slightly more pronounced than the prior exam. This could be due to interstitial edema and/or early infectious etiology. Electronically Signed   By: Prudencio Pair M.D.   On: 12/31/2018 03:38   Dg Chest Port 1 View  Result Date: 12/29/2018 CLINICAL DATA:  Altered mental status EXAM: PORTABLE CHEST 1 VIEW COMPARISON:  November 12, 2016 FINDINGS: The heart size and mediastinal contours are within normal limits. There is prominence to the central pulmonary vasculature. No large airspace consolidation or pleural effusion. IMPRESSION: Findings suggestive pulmonary vascular congestion. Electronically Signed  By: Prudencio Pair M.D.   On: 12/29/2018 21:16    Labs:  CBC: Recent Labs    01/23/19 0501 01/24/19 0449 01/25/19 0627 01/27/19 0918  WBC 8.6 11.8* 15.1* 11.1*  HGB 7.5* 8.6* 8.8* 9.7*  HCT 22.9*  26.2* 27.5* 30.1*  PLT 345 280 268 263    COAGS: Recent Labs    12/29/18 2115 01/19/19 2325 01/20/19 0905 01/27/19 0918  INR 1.4* 1.3*  --  1.2  APTT 32  --  119* 40*    BMP: Recent Labs    01/22/19 1631 01/24/19 0449 01/25/19 0627 01/27/19 0918  NA 137 136 139 144  K 4.5 4.4 3.9 4.5  CL 110 109 110 111  CO2 19* 17* 21* 23  GLUCOSE 221* 296* 159* 53*  BUN 17 20 13 12   CALCIUM 8.5* 8.5* 8.7* 8.9  CREATININE 1.33* 1.41* 1.32* 1.31*  GFRNONAA 41* 38* 41* 41*  GFRAA 47* 44* 48* 48*    LIVER FUNCTION TESTS: Recent Labs    12/31/18 0444 01/19/19 2325 01/20/19 1120 01/22/19 1631  BILITOT 0.5 0.6 0.5 0.9  AST 81* 145* 82* 311*  ALT 45* 118* 83* 122*  ALKPHOS 203* 1,044* 851* 1,059*  PROT 6.4* 8.6* 7.3 6.6  ALBUMIN 3.0* 3.7 3.1* 2.9*    TUMOR MARKERS: No results for input(s): AFPTM, CEA, CA199, CHROMGRNA in the last 8760 hours.  Assessment and Plan:  Liver lesions--CT Bx.  Thank you for this interesting consult.  I greatly enjoyed meeting MARLEANA ALMQUIST and look forward to participating in their care.  A copy of this report was sent to the requesting provider on this date.  Electronically Signed: Register, Melanie Crazier, MD 01/27/2019, 10:32 AM    I spent a total of  20 min   in face to face in clinical consultation, greater than 50% of which was counseling/coordinating care for this pt.

## 2019-01-27 NOTE — Progress Notes (Signed)
Patient remains clinically stable post Liver Biopsy per Dr Register, tolerated well. Taking po's without difficutly. Vitals have remained stable post procedure. bandade dressing dry and intact, no complaints at this time. Discharge instructions given to patient with questions answered.

## 2019-01-27 NOTE — Progress Notes (Signed)
Pt. Stable after bx .VSS.Abd benign.

## 2019-01-29 ENCOUNTER — Other Ambulatory Visit: Payer: Self-pay

## 2019-01-29 LAB — SURGICAL PATHOLOGY

## 2019-01-30 ENCOUNTER — Inpatient Hospital Stay: Payer: Medicare PPO | Attending: Internal Medicine

## 2019-01-30 ENCOUNTER — Other Ambulatory Visit: Payer: Self-pay

## 2019-01-30 ENCOUNTER — Inpatient Hospital Stay (HOSPITAL_BASED_OUTPATIENT_CLINIC_OR_DEPARTMENT_OTHER): Payer: Medicare PPO | Admitting: Internal Medicine

## 2019-01-30 VITALS — BP 127/85 | HR 89 | Temp 96.8°F | Wt 132.0 lb

## 2019-01-30 DIAGNOSIS — C787 Secondary malignant neoplasm of liver and intrahepatic bile duct: Secondary | ICD-10-CM | POA: Diagnosis not present

## 2019-01-30 DIAGNOSIS — C801 Malignant (primary) neoplasm, unspecified: Secondary | ICD-10-CM

## 2019-01-30 DIAGNOSIS — I2609 Other pulmonary embolism with acute cor pulmonale: Secondary | ICD-10-CM | POA: Diagnosis not present

## 2019-01-30 DIAGNOSIS — R0602 Shortness of breath: Secondary | ICD-10-CM | POA: Diagnosis not present

## 2019-01-30 LAB — COMPREHENSIVE METABOLIC PANEL
ALT: 113 U/L — ABNORMAL HIGH (ref 0–44)
AST: 123 U/L — ABNORMAL HIGH (ref 15–41)
Albumin: 3.3 g/dL — ABNORMAL LOW (ref 3.5–5.0)
Alkaline Phosphatase: 1627 U/L — ABNORMAL HIGH (ref 38–126)
Anion gap: 11 (ref 5–15)
BUN: 15 mg/dL (ref 8–23)
CO2: 22 mmol/L (ref 22–32)
Calcium: 9 mg/dL (ref 8.9–10.3)
Chloride: 105 mmol/L (ref 98–111)
Creatinine, Ser: 1.39 mg/dL — ABNORMAL HIGH (ref 0.44–1.00)
GFR calc Af Amer: 45 mL/min — ABNORMAL LOW (ref >60)
GFR calc non Af Amer: 39 mL/min — ABNORMAL LOW (ref >60)
Glucose, Bld: 232 mg/dL — ABNORMAL HIGH (ref 70–99)
Potassium: 4.2 mmol/L (ref 3.5–5.1)
Sodium: 138 mmol/L (ref 135–145)
Total Bilirubin: 0.6 mg/dL (ref 0.3–1.2)
Total Protein: 8 g/dL (ref 6.5–8.1)

## 2019-01-30 LAB — CBC WITH DIFFERENTIAL/PLATELET
Abs Immature Granulocytes: 0.03 10*3/uL (ref 0.00–0.07)
Basophils Absolute: 0.1 10*3/uL (ref 0.0–0.1)
Basophils Relative: 1 %
Eosinophils Absolute: 0.3 10*3/uL (ref 0.0–0.5)
Eosinophils Relative: 3 %
HCT: 32.9 % — ABNORMAL LOW (ref 36.0–46.0)
Hemoglobin: 10 g/dL — ABNORMAL LOW (ref 12.0–15.0)
Immature Granulocytes: 0 %
Lymphocytes Relative: 24 %
Lymphs Abs: 2.2 10*3/uL (ref 0.7–4.0)
MCH: 22.6 pg — ABNORMAL LOW (ref 26.0–34.0)
MCHC: 30.4 g/dL (ref 30.0–36.0)
MCV: 74.4 fL — ABNORMAL LOW (ref 80.0–100.0)
Monocytes Absolute: 0.8 10*3/uL (ref 0.1–1.0)
Monocytes Relative: 10 %
Neutro Abs: 5.4 10*3/uL (ref 1.7–7.7)
Neutrophils Relative %: 62 %
Platelets: 304 10*3/uL (ref 150–400)
RBC: 4.42 MIL/uL (ref 3.87–5.11)
RDW: 15.4 % (ref 11.5–15.5)
WBC: 8.8 10*3/uL (ref 4.0–10.5)
nRBC: 0 % (ref 0.0–0.2)

## 2019-01-30 MED ORDER — ONDANSETRON HCL 8 MG PO TABS: ORAL_TABLET | ORAL | 1 refills | Status: DC

## 2019-01-30 MED ORDER — APIXABAN 5 MG PO TABS: 5.0000 mg | ORAL_TABLET | Freq: Two times a day (BID) | ORAL | 5 refills | Status: DC

## 2019-01-30 NOTE — Progress Notes (Signed)
[place] CONSULT NOTE  Patient Care Team: Revelo, Elyse Jarvis, MD as PCP - General (Family Medicine) Flora Lipps, MD (Pulmonary Disease) [name], MD as Consulting Physician (Oncology)  CHIEF COMPLAINTS/PURPOSE OF CONSULTATION: Liver lesions/bilateral PE #  Oncology History Overview Note  # DEC 2020- CT scan- MULTIPLE LIVER LESIONS s/p Bx- HIGH GRADE ADENO CA [not melanoma/or vascular tumor]; CEA- 396; CA 15-3-56/CA 27-29-150;CA-19-9-N;   # Bil PE/right heart strain [s/p thrombectomy; Dr.Dew/Dr.Aleskerov; no lysis- sec to recent stroke]; on eliquis  # COPD/  # NGS/MOLECULAR TESTS:p  # PALLIATIVE CARE EVALUATION:p  # PAIN MANAGEMENT: p   DIAGNOSIS:   STAGE:         ;  GOALS:  CURRENT/MOST RECENT THERAPY :     Liver metastases ([place])   Initial Diagnosis   Liver metastases (H[facility])      HISTORY OF PRESENTING ILLNESS:  Audrey Chan 68 y.o. very pleasant  female with history of smoking/COPD is here for follow-up after recent hospitalization for bilateral PE/multiple liver lesions.  Patient was admitted to hospital for worsening shortness of breath-CT scan showed bilateral PE needed; thrombectomy.  Patient is currently using Lovenox injections.  Patient also noted to have bilateral liver lesions-status post liver biopsy.  Poor appetite.  Weight loss.  Fatigue.  Shortness of breath.  Chronic diarrhea/nausea.  Overall she feels poorly.  Review of Systems  Constitutional: Positive for malaise/fatigue and weight loss. Negative for chills, diaphoresis and fever.  HENT: Negative for nosebleeds and sore throat.   Eyes: Negative for double vision.  Respiratory: Positive for shortness of breath. Negative for cough, hemoptysis, sputum production and wheezing.   Cardiovascular: Negative for chest pain, palpitations, orthopnea and leg swelling.  Gastrointestinal: Positive for diarrhea and nausea. Negative for abdominal pain, blood in stool,  constipation, heartburn, melena and vomiting.  Genitourinary: Negative for dysuria, frequency and urgency.  Musculoskeletal: Negative for back pain and joint pain.  Skin: Negative.  Negative for itching and rash.  Neurological: Negative for dizziness, tingling, focal weakness, weakness and headaches.  Endo/Heme/Allergies: Does not bruise/bleed easily.  Psychiatric/Behavioral: Negative for depression. The patient is not nervous/anxious and does not have insomnia.      MEDICAL HISTORY:  Past Medical History:  Diagnosis Date  . Allergic rhinitis   . Aneurysm of unspecified site ([place])   . Cholelithiasis   . Diabetes type 2, controlled ([place])   . GERD (gastroesophageal reflux disease)   . Hypertension   . Leg DVT (deep venous thromboembolism), acute, bilateral ([place]) 01/21/2019  . Multiple subsegmental pulmonary emboli without acute cor pulmonale ([place]) 01/20/2019  . Neuropathy   . Veno-occlusive disease     SURGICAL HISTORY: Past Surgical History:  Procedure Laterality Date  . ABDOMINAL HYSTERECTOMY  1972  . CERVICAL FUSION    . CHOLECYSTECTOMY N/A 12/28/2014   Procedure: LAPAROSCOPIC CHOLECYSTECTOMY;  Surgeon: Sherri Rad, MD;  Location: [facility] ORS;  Service: General;  Laterality: N/A;  . EYE SURGERY     detached retina  . MASS EXCISION Left 05/06/2018   Procedure: EXCISION OF LEFT ANTECUBITAL CYST, DIABETIC;  Surgeon: Vickie Epley, MD;  Location: [facility] ORS;  Service: Vascular;  Laterality: Left;  . PULMONARY THROMBECTOMY Bilateral 01/22/2019   Procedure: PULMONARY THROMBECTOMY;  Surgeon: Algernon Huxley, MD;  Location: [facility];  Service: Cardiovascular;  Laterality: Bilateral;    SOCIAL HISTORY: Social History   Socioeconomic History  . Marital status: Widowed    Spouse name: Not on file  .  Number of children: 1  . Years of education: Not on file  . Highest education level: Not on file  Occupational History  . Not on file  Tobacco Use  . Smoking status: Former  Smoker    Quit date: 02/21/1999    Years since quitting: 19.9  . Smokeless tobacco: Never Used  Substance and Sexual Activity  . Alcohol use: No    Alcohol/week: 0.0 standard drinks  . Drug use: No  . Sexual activity: Not on file  Other Topics Concern  . Not on file  Social History Narrative  . Not on file   Social Determinants of Health   Financial Resource Strain:   . Difficulty of Paying Living Expenses: Not on file  Food Insecurity:   . Worried About Charity fundraiser in the Last Year: Not on file  . Ran Out of Food in the Last Year: Not on file  Transportation Needs:   . Lack of Transportation (Medical): Not on file  . Lack of Transportation (Non-Medical): Not on file  Physical Activity:   . Days of Exercise per Week: Not on file  . Minutes of Exercise per Session: Not on file  Stress:   . Feeling of Stress : Not on file  Social Connections:   . Frequency of Communication with Friends and Family: Not on file  . Frequency of Social Gatherings with Friends and Family: Not on file  . Attends Religious Services: Not on file  . Active Member of Clubs or Organizations: Not on file  . Attends Archivist Meetings: Not on file  . Marital Status: Not on file  Intimate Partner Violence:   . Fear of Current or Ex-Partner: Not on file  . Emotionally Abused: Not on file  . Physically Abused: Not on file  . Sexually Abused: Not on file    FAMILY HISTORY: Family History  Problem Relation Age of Onset  . Lung cancer Father     ALLERGIES:  has No Known Allergies.  MEDICATIONS:  Current Outpatient Medications  Medication Sig Dispense Refill  . acetaminophen (TYLENOL) 500 MG tablet Take 500-1,000 mg by mouth every 6 (six) hours as needed for moderate pain.     Marland Kitchen albuterol (PROVENTIL HFA;VENTOLIN HFA) 108 (90 BASE) MCG/ACT inhaler Inhale 2 puffs into the lungs every 6 (six) hours as needed for wheezing or shortness of breath.     Marland Kitchen amLODipine (NORVASC) 5 MG tablet  Take 1 tablet (5 mg total) by mouth daily. 30 tablet 0  . cetirizine (ZYRTEC) 10 MG tablet Take 10 mg by mouth daily.     . Cholecalciferol (VITAMIN D3) 1000 units CAPS Take 1,000 Units by mouth daily.     Marland Kitchen enoxaparin (LOVENOX) 60 MG/0.6ML injection Inject 0.55 mLs (55 mg total) into the skin 2 (two) times daily for 9 days. 10 mL 0  . ferrous sulfate 325 (65 FE) MG tablet Take 325 mg by mouth 2 (two) times daily with a meal.     . fluticasone (FLONASE) 50 MCG/ACT nasal spray Place 2 sprays into both nostrils daily as needed for allergies or rhinitis.     Marland Kitchen gabapentin (NEURONTIN) 800 MG tablet Take 800 mg by mouth 3 (three) times daily.     . insulin aspart (NOVOLOG) 100 UNIT/ML injection Check blood sugar prior to meals and give insulin on the following scale: Blood sugar less than 150: 0 units BS 150-200: 2 units BS 200-250: 3 units BS 250-300: 4 units BS 300-350:  5 units BS 350-400: 6 units BS above 400: 8 units and call your doctor. 10 mL 1  . ipratropium-albuterol (DUONEB) 0.5-2.5 (3) MG/3ML SOLN Take 3 mLs by nebulization every 6 (six) hours as needed. 360 mL 0  . LANTUS SOLOSTAR 100 UNIT/ML Solostar Pen Inject 25 Units into the skin at bedtime. 15 mL 0  . lisinopril (ZESTRIL) 10 MG tablet Take 1 tablet (10 mg total) by mouth daily. 30 tablet 0  . metFORMIN (GLUCOPHAGE-XR) 500 MG 24 hr tablet Take 500 mg by mouth daily with breakfast.    . metoprolol tartrate (LOPRESSOR) 25 MG tablet Take 12.5 mg by mouth 2 (two) times daily.    . Multiple Vitamin (MULTI-VITAMINS) TABS Take 1 tablet by mouth daily.    . rosuvastatin (CRESTOR) 20 MG tablet Take 20 mg by mouth daily.     Marland Kitchen umeclidinium-vilanterol (ANORO ELLIPTA) 62.5-25 MCG/INH AEPB Inhale 1 puff into the lungs daily.    Marland Kitchen apixaban (ELIQUIS) 5 MG TABS tablet Take 1 tablet (5 mg total) by mouth 2 (two) times daily. 60 tablet 5  . diclofenac (FLECTOR) 1.3 % PTCH Apply 1 patch topically 2 (two) times daily. Apply 1 patch twice a day.    .  mometasone (ELOCON) 0.1 % cream Apply 1 application topically 2 (two) times daily as needed (rash).     . ondansetron (ZOFRAN) 8 MG tablet One pill every 8 hours as needed for nausea/vomitting. 40 tablet 1  . polyethylene glycol powder (GLYCOLAX/MIRALAX) powder Take 0.5 Containers by mouth daily as needed for moderate constipation.      No current facility-administered medications for this visit.      Marland Kitchen  PHYSICAL EXAMINATION: ECOG PERFORMANCE STATUS: 1 - Symptomatic but completely ambulatory  Vitals:   01/30/19 0845  BP: 127/85  Pulse: 89  Temp: (!) 96.8 F (36 C)   Filed Weights   01/30/19 0845  Weight: 132 lb (59.9 kg)    Physical Exam  Constitutional: She is oriented to person, place, and time.  2 L of oxygen.  Accompanied by sister.  She is in a wheelchair.  HENT:  Head: Normocephalic and atraumatic.  Mouth/Throat: Oropharynx is clear and moist. No oropharyngeal exudate.  Eyes: Pupils are equal, round, and reactive to light.  Cardiovascular: Normal rate and regular rhythm.  Pulmonary/Chest: No respiratory distress. She has no wheezes.  Decreased air entry bilaterally.  No wheeze or crackles  Abdominal: Soft. Bowel sounds are normal. She exhibits no distension and no mass. There is no abdominal tenderness. There is no rebound and no guarding.  Positive for hepatomegaly.  Musculoskeletal:        General: No tenderness or edema. Normal range of motion.     Cervical back: Normal range of motion and neck supple.  Neurological: She is alert and oriented to person, place, and time.  Skin: Skin is warm.  Right and left BREAST exam (in the presence of pt's sister)- no unusual skin changes or dominant masses felt.     Psychiatric: Affect normal.     LABORATORY DATA:  I have reviewed the data as listed Lab Results  Component Value Date   WBC 8.8 01/30/2019   HGB 10.0 (L) 01/30/2019   HCT 32.9 (L) 01/30/2019   MCV 74.4 (L) 01/30/2019   PLT 304 01/30/2019   Recent  Labs    12/31/18 0444 01/01/19 0452 01/20/19 1120 01/22/19 1631 01/25/19 0627 01/27/19 0918 01/30/19 0823  NA 139  --  135 137 139 144 138  K 3.1*  --  4.8 4.5 3.9 4.5 4.2  CL 111  --  104 110 110 111 105  CO2 19*  --  20* 19* 21* 23 22  GLUCOSE 186*  --  121* 221* 159* 53* 232*  BUN 17  --  24* 17 13 12 15   CREATININE 1.27*  --  1.33* 1.33* 1.32* 1.31* 1.39*  CALCIUM 8.2*  --  8.8* 8.5* 8.7* 8.9 9.0  GFRNONAA 43*  --  41* 41* 41* 41* 39*  GFRAA 50*  --  47* 47* 48* 48* 45*  PROT 6.4*   < > 7.3 6.6  --   --  8.0  ALBUMIN 3.0*   < > 3.1* 2.9*  --   --  3.3*  AST 81*   < > 82* 311*  --   --  123*  ALT 45*   < > 83* 122*  --   --  113*  ALKPHOS 203*   < > 851* 1,059*  --   --  1,627*  BILITOT 0.5   < > 0.5 0.9  --   --  0.6  BILIDIR <0.1  --   --   --   --   --   --   IBILI NOT CALCULATED  --   --   --   --   --   --    < > = values in this interval not displayed.    RADIOGRAPHIC STUDIES: I have personally reviewed the radiological images as listed and agreed with the findings in the report. EEG  Result Date: 01/01/2019 Lora Havens, MD     01/01/2019  4:58 PM Patient Name: Audrey Chan MRN: [id number] Epilepsy Attending: Lora Havens Referring Physician/Provider: Dr Leotis Pain Date: 01/01/2019 Duration: 24.42 mins Patient history: 68yo F with ams. EEG to evaluate for seizure Level of alertness: awake AEDs during EEG study: None Technical aspects: This EEG study was done with scalp electrodes positioned according to the 10-20 International system of electrode placement. Electrical activity was acquired at a sampling rate of 500Hz  and reviewed with a high frequency filter of 70Hz  and a low frequency filter of 1Hz . EEG data were recorded continuously and digitally stored. DESCRIPTION: During awake state, no clear posterior dominant rhythm was seen. EEG showed continuous generalized polymorphic 3-6hz  theta-delta slowing as well as intermittent rhythmic generalized and  maximum bifrontal 2-4hz  delta slowing. Hyperventilation and photic stimulation were not performed. ABNORMALITY - Continuous slow, generalized - Intermittent rhythmic slow, generalized, maximum bifrontal IMPRESSION: This study is suggestive of moderate diffuse encephalopathy, non specific to etiology. No seizures or epileptiform discharges were seen throughout the recording.   CT Angio Chest PE W/Cm &/Or Wo Cm  Result Date: 01/20/2019 CLINICAL DATA:  Suspected covid, elevated LFTs, recent stroke EXAM: CT ANGIOGRAPHY CHEST, abdomen, and pelvis WITH CONTRAST TECHNIQUE: Multidetector CT imaging of the chest, abdomen, and pelvis was performed using the standard protocol during bolus administration of intravenous contrast. Multiplanar CT image reconstructions and MIPs were obtained to evaluate the vascular anatomy. CONTRAST:  71mL OMNIPAQUE IOHEXOL 350 MG/ML SOLN COMPARISON:  CT chest September 13, 2017 FINDINGS: Cardiovascular: There is a optimal opacification of the pulmonary arteries. There is a small nonocclusive thrombus seen within the left main pulmonary artery which extends into the anterior left upper lobe segmental branches and partially occlusive thrombus in the posterior left lower lobe subsegmental branches. There is also partially occlusive thrombus seen within the posterior right upper lobe segmental branch,  series 6, image 37. There is also partially occlusive thrombus seen extending into the right middle lobe subsegmental branches and posterior right lower lobe subsegmental branches. The heart is normal in size. No pericardial effusion or thickening. There is slight straightening of the interventricular septum which could be due to early right ventricular heart strain. There is normal three-vessel brachiocephalic anatomy without proximal stenosis. Scattered atherosclerosis is noted. The thoracic aorta is normal in appearance. Mediastinum/Nodes: No hilar, mediastinal, or axillary adenopathy. Thyroid gland,  trachea, and esophagus demonstrate no significant findings. Lungs/Pleura: Small subcentimeter pulmonary nodule are again identified as on prior exam dating back to 2019, for example in the posterior right upper lobe there is a 5 mm pulmonary nodule, series 8, image 21 and anterior right lower lobe, series 8, image 54 a 4 mm pulmonary nodule. There is however a new small nodular opacity in the posterior left upper lobe, series 8, image 47 measuring 7 mm not seen on prior exam. No large airspace consolidation or pleural effusion is seen. Centrilobular emphysematous changes seen within both lung apices. Musculoskeletal: No chest wall abnormality. No acute or significant osseous findings. Review of the MIP images confirms the above findings. Abdomen/pelvis: Hepatobiliary: There are multiple low-density liver lesions seen throughout which were not seen on the prior exam. There is also heterogeneous enhancement seen throughout the liver parenchyma. Central periportal edema is noted. The portal vein is patent. The patient is status post cholecystectomy. A small amount of perihepatic ascites is noted. Pancreas: Unremarkable. No pancreatic ductal dilatation or surrounding inflammatory changes. Spleen: Normal in size without focal abnormality. Adrenals/Urinary Tract: Both adrenal glands appear normal. The kidneys and collecting system appear normal without evidence of urinary tract calculus or hydronephrosis. Bladder is unremarkable. Stomach/Bowel: The stomach, small bowel, and colon are normal in appearance. No inflammatory changes, wall thickening, or obstructive findings. Vascular/Lymphatic: There are no enlarged mesenteric, retroperitoneal, or pelvic lymph nodes. Scattered aortic atherosclerotic calcifications are seen without aneurysmal dilatation. Reproductive: The uterus and adnexa are unremarkable. Other: No evidence of abdominal wall mass or hernia. Musculoskeletal: No acute or significant osseous findings.  IMPRESSION: 1. Bilateral partially occlusive segmental and subsegmental pulmonary emboli as described above. 2. Findings which could be suggestive of early right ventricular heart strain. 3. Multiple unchanged subcentimeter pulmonary nodules as on prior exam dating back to 2019. 4. New nodular opacity within the posterior left upper lobe measuring 7 mm which is nonspecific, and would recommend attention on short interval follow-up exam. 5. Centrilobular emphysematous changes. 6. Numerous hepatic lesions throughout, likely suggestive of hepatic metastatic disease. 7. Small amount of perihepatic ascites 8. These results were called by telephone at the time of interpretation on 01/20/2019 at 2:07 am to provider [facility] , who verbally acknowledged these results. Electronically Signed   By: Prudencio Pair M.D.   On: 01/20/2019 02:09   MR BRAIN W WO CONTRAST  Result Date: 01/01/2019 CLINICAL DATA:  Encephalopathy.  DKA. EXAM: MRI HEAD WITHOUT AND WITH CONTRAST TECHNIQUE: Multiplanar, multiecho pulse sequences of the brain and surrounding structures were obtained without and with intravenous contrast. CONTRAST:  32mL GADAVIST GADOBUTROL 1 MMOL/ML IV SOLN COMPARISON:  CT head 12/31/2018 FINDINGS: Brain: Small acute infarct left inferior cerebellum. No other acute infarct Chronic infarct left inferior cerebellum. Extensive white matter disease bilaterally. Diffuse subcortical and deep white matter hyperintensity throughout both cerebral hemispheres. Hyperintensity is also noted in the right thalamus. Negative for hemorrhage or mass. Ventricle size normal. Normal enhancement postcontrast administration Vascular: Normal arterial flow  voids Skull and upper cervical spine: No focal bony abnormality. Cervical spine fusion hardware. Sinuses/Orbits: Negative Other: None IMPRESSION: 1. Acute small infarct left medial cerebellum. 2. Extensive disease throughout the white matter most compatible with chronic ischemia. Chronic  infarct left cerebellum. Electronically Signed   By: Franchot Gallo M.D.   On: 01/01/2019 15:05   CT abd/pelvis w/ IV contrast  Result Date: 01/20/2019 CLINICAL DATA:  Suspected covid, elevated LFTs, recent stroke EXAM: CT ANGIOGRAPHY CHEST, abdomen, and pelvis WITH CONTRAST TECHNIQUE: Multidetector CT imaging of the chest, abdomen, and pelvis was performed using the standard protocol during bolus administration of intravenous contrast. Multiplanar CT image reconstructions and MIPs were obtained to evaluate the vascular anatomy. CONTRAST:  56mL OMNIPAQUE IOHEXOL 350 MG/ML SOLN COMPARISON:  CT chest September 13, 2017 FINDINGS: Cardiovascular: There is a optimal opacification of the pulmonary arteries. There is a small nonocclusive thrombus seen within the left main pulmonary artery which extends into the anterior left upper lobe segmental branches and partially occlusive thrombus in the posterior left lower lobe subsegmental branches. There is also partially occlusive thrombus seen within the posterior right upper lobe segmental branch, series 6, image 37. There is also partially occlusive thrombus seen extending into the right middle lobe subsegmental branches and posterior right lower lobe subsegmental branches. The heart is normal in size. No pericardial effusion or thickening. There is slight straightening of the interventricular septum which could be due to early right ventricular heart strain. There is normal three-vessel brachiocephalic anatomy without proximal stenosis. Scattered atherosclerosis is noted. The thoracic aorta is normal in appearance. Mediastinum/Nodes: No hilar, mediastinal, or axillary adenopathy. Thyroid gland, trachea, and esophagus demonstrate no significant findings. Lungs/Pleura: Small subcentimeter pulmonary nodule are again identified as on prior exam dating back to 2019, for example in the posterior right upper lobe there is a 5 mm pulmonary nodule, series 8, image 21 and anterior  right lower lobe, series 8, image 54 a 4 mm pulmonary nodule. There is however a new small nodular opacity in the posterior left upper lobe, series 8, image 47 measuring 7 mm not seen on prior exam. No large airspace consolidation or pleural effusion is seen. Centrilobular emphysematous changes seen within both lung apices. Musculoskeletal: No chest wall abnormality. No acute or significant osseous findings. Review of the MIP images confirms the above findings. Abdomen/pelvis: Hepatobiliary: There are multiple low-density liver lesions seen throughout which were not seen on the prior exam. There is also heterogeneous enhancement seen throughout the liver parenchyma. Central periportal edema is noted. The portal vein is patent. The patient is status post cholecystectomy. A small amount of perihepatic ascites is noted. Pancreas: Unremarkable. No pancreatic ductal dilatation or surrounding inflammatory changes. Spleen: Normal in size without focal abnormality. Adrenals/Urinary Tract: Both adrenal glands appear normal. The kidneys and collecting system appear normal without evidence of urinary tract calculus or hydronephrosis. Bladder is unremarkable. Stomach/Bowel: The stomach, small bowel, and colon are normal in appearance. No inflammatory changes, wall thickening, or obstructive findings. Vascular/Lymphatic: There are no enlarged mesenteric, retroperitoneal, or pelvic lymph nodes. Scattered aortic atherosclerotic calcifications are seen without aneurysmal dilatation. Reproductive: The uterus and adnexa are unremarkable. Other: No evidence of abdominal wall mass or hernia. Musculoskeletal: No acute or significant osseous findings. IMPRESSION: 1. Bilateral partially occlusive segmental and subsegmental pulmonary emboli as described above. 2. Findings which could be suggestive of early right ventricular heart strain. 3. Multiple unchanged subcentimeter pulmonary nodules as on prior exam dating back to 2019. 4. New  nodular  opacity within the posterior left upper lobe measuring 7 mm which is nonspecific, and would recommend attention on short interval follow-up exam. 5. Centrilobular emphysematous changes. 6. Numerous hepatic lesions throughout, likely suggestive of hepatic metastatic disease. 7. Small amount of perihepatic ascites 8. These results were called by telephone at the time of interpretation on 01/20/2019 at 2:07 am to provider [facility] , who verbally acknowledged these results. Electronically Signed   By: Prudencio Pair M.D.   On: 01/20/2019 02:09   PERIPHERAL VASCULAR CATHETERIZATION  Result Date: 01/22/2019 See op note  US Venous Img Lower Bilateral (DVT)  Result Date: 01/20/2019 CLINICAL DATA:  History of pulmonary embolism, now with lower extremity pain. Evaluate for DVT. EXAM: BILATERAL LOWER EXTREMITY VENOUS DOPPLER ULTRASOUND TECHNIQUE: Gray-scale sonography with graded compression, as well as color Doppler and duplex ultrasound were performed to evaluate the lower extremity deep venous systems from the level of the common femoral vein and including the common femoral, femoral, profunda femoral, popliteal and calf veins including the posterior tibial, peroneal and gastrocnemius veins when visible. The superficial great saphenous vein was also interrogated. Spectral Doppler was utilized to evaluate flow at rest and with distal augmentation maneuvers in the common femoral, femoral and popliteal veins. COMPARISON:  Chest CTA-01/20/2019 FINDINGS: RIGHT LOWER EXTREMITY Common Femoral Vein: No evidence of thrombus. Normal compressibility, respiratory phasicity and response to augmentation. Saphenofemoral Junction: No evidence of thrombus. Normal compressibility and flow on color Doppler imaging. Profunda Femoral Vein: No evidence of thrombus. Normal compressibility and flow on color Doppler imaging. Femoral Vein: No evidence of thrombus. Normal compressibility, respiratory phasicity and response to  augmentation. Popliteal Vein: No evidence of thrombus. Normal compressibility, respiratory phasicity and response to augmentation. Calf Veins: There is hypoechoic occlusive thrombus within both paired right peroneal veins (images 58 and 59). Both paired right posterior tibial veins appear patent where imaged. Superficial Great Saphenous Vein: No evidence of thrombus. Normal compressibility. Other Findings:  None. _________________________________________________________ LEFT LOWER EXTREMITY Common Femoral Vein: No evidence of thrombus. Normal compressibility, respiratory phasicity and response to augmentation. Saphenofemoral Junction: No evidence of thrombus. Normal compressibility and flow on color Doppler imaging. Profunda Femoral Vein: No evidence of thrombus. Normal compressibility and flow on color Doppler imaging. Femoral Vein: No evidence of thrombus. Normal compressibility, respiratory phasicity and response to augmentation. Popliteal Vein: No evidence of thrombus. Normal compressibility, respiratory phasicity and response to augmentation. Calf Veins: There is hypoechoic occlusive thrombus within 1 of the paired left peroneal veins (images 29 and 30). The adjacent paired left peroneal vein as well though both paired posterior tibial veins appear patent. Superficial Great Saphenous Vein: No evidence of thrombus. Normal compressibility. Other Findings:  None. IMPRESSION: Examination is positive for occlusive DVT involving both paired right peroneal veins as well as one of two paired left peroneal veins. There is no extension of this distal tibial DVT to the more proximal venous system of either lower extremity. Electronically Signed   By: [name] Mariscal M.D.   On: 01/20/2019 17:02   CT BIOPSY  Result Date: 01/27/2019 INDICATION: Multiple liver lesions. EXAM: CT directed liver biopsy. MEDICATIONS: None. ANESTHESIA/SEDATION: Moderate (conscious) sedation was employed during this procedure. A total of Versed 2  mg and Fentanyl 25 mcg was administered intravenously. Moderate Sedation Time: 14 minutes. The patient's level of consciousness and vital signs were monitored continuously by radiology nursing throughout the procedure under my direct supervision. FLUOROSCOPY TIME:  Fluoroscopy Time: 0 minutes 0 seconds (0 mGy). COMPLICATIONS: None immediate. PROCEDURE: After  discussing the risks and benefits of this procedure the patient informed consent was obtained. Standard time-out procedure was performed. Patient was sterilely prepped and draped. Following local anesthesia with 1% lidocaine and IV conscious sedation CT-directed liver lesion core biopsy was obtained with 2 good 23 mm 18 gauge core biopsy samples obtained. Sample sent to pathology. There no complications. IMPRESSION: Successful liver lesion CT-directed biopsy. Electronically Signed   By: Marcello Moores  Register   On: 01/27/2019 11:47   DG Chest Port 1 View  Result Date: 01/23/2019 CLINICAL DATA:  Hypertension, pulmonary emboli EXAM: PORTABLE CHEST 1 VIEW COMPARISON:  01/20/2019, 01/19/2019 FINDINGS: The heart size and mediastinal contours are stable. No new focal airspace consolidation, pleural effusion, or pneumothorax. Lungs are mildly hyperexpanded. The visualized skeletal structures are unremarkable. IMPRESSION: Stable appearance of the chest without new airspace opacity. Electronically Signed   By: Davina Poke M.D.   On: 01/23/2019 12:54   DG Chest Portable 1 View  Result Date: 01/19/2019 CLINICAL DATA:  Shortness of breath EXAM: PORTABLE CHEST 1 VIEW COMPARISON:  12/31/2018 FINDINGS: Cardiomegaly. Lungs clear. No effusions or edema. No acute bony abnormality. IMPRESSION: Cardiomegaly.  No active disease. Electronically Signed   By: Rolm Baptise M.D.   On: 01/19/2019 23:33   ECHOCARDIOGRAM COMPLETE  Result Date: 01/20/2019   ECHOCARDIOGRAM REPORT   Patient Name:   Audrey Chan Date of Exam: 01/20/2019 Medical Rec #:  [id number]          Height:       66.0 in Accession #:    [id number]        Weight:       122.0 lb Date of Birth:  October 07, 1950         BSA:          1.62 m Patient Age:    45 years          BP:           116/71 mmHg Patient Gender: F                 HR:           91 bpm. Exam Location:  [facility] Procedure: 2D Echo, Cardiac Doppler and Color Doppler Indications:     Abnormal ECG 794.31  History:         Patient has no prior history of Echocardiogram examinations.                  Risk Factors:Hypertension and Diabetes.  Sonographer:     Sherrie Sport RDCS (AE) Referring Phys:  [id number] Victoriano Lain A THOMAS Diagnosing Phys: Serafina Royals MD IMPRESSIONS  1. Left ventricular ejection fraction, by visual estimation, is 55 to 60%. The left ventricle has normal function. There is no left ventricular hypertrophy.  2. Global right ventricle has moderately reduced systolic function.The right ventricular size is moderately enlarged. Mildly increased right ventricular wall thickness.  3. Left atrial size was normal.  4. Right atrial size was moderately dilated.  5. The mitral valve is normal in structure. Trace mitral valve regurgitation.  6. The tricuspid valve is normal in structure. Tricuspid valve regurgitation moderate.  7. The aortic valve is normal in structure. Aortic valve regurgitation is not visualized.  8. The pulmonic valve was grossly normal. Pulmonic valve regurgitation is trivial.  9. Moderately elevated pulmonary artery systolic pressure. 10. The tricuspid regurgitant velocity is 3.62 m/s, and with an assumed right atrial pressure of 10 mmHg, the estimated right ventricular systolic pressure  is moderately elevated at 62.6 mmHg. FINDINGS  Left Ventricle: Left ventricular ejection fraction, by visual estimation, is 55 to 60%. The left ventricle has normal function. There is no left ventricular hypertrophy. Right Ventricle: The right ventricular size is moderately enlarged. Mildly increased right ventricular wall thickness. Global RV systolic  function is has moderately reduced systolic function. The tricuspid regurgitant velocity is 3.62 m/s, and with an assumed right atrial pressure of 10 mmHg, the estimated right ventricular systolic pressure is moderately elevated at 62.6 mmHg. Left Atrium: Left atrial size was normal in size. Right Atrium: Right atrial size was moderately dilated Pericardium: There is no evidence of pericardial effusion. Mitral Valve: The mitral valve is normal in structure. Trace mitral valve regurgitation. Tricuspid Valve: The tricuspid valve is normal in structure. Tricuspid valve regurgitation moderate. Aortic Valve: The aortic valve is normal in structure. Aortic valve regurgitation is not visualized. Aortic valve mean gradient measures 3.7 mmHg. Aortic valve peak gradient measures 5.2 mmHg. Aortic valve area, by VTI measures 1.65 cm. Pulmonic Valve: The pulmonic valve was grossly normal. Pulmonic valve regurgitation is trivial. Aorta: The aortic root and ascending aorta are structurally normal, with no evidence of dilitation. IAS/Shunts: No atrial level shunt detected by color flow Doppler.  LEFT VENTRICLE PLAX 2D LVIDd:         3.16 cm  Diastology LVIDs:         1.90 cm  LV e' lateral:   5.77 cm/s LV PW:         1.41 cm  LV E/e' lateral: 9.3 LV IVS:        1.11 cm  LV e' medial:    3.92 cm/s LVOT diam:     2.00 cm  LV E/e' medial:  13.6 LV SV:         29 ml LV SV Index:   17.85 LVOT Area:     3.14 cm  RIGHT VENTRICLE RV Basal diam:  4.44 cm RV S prime:     10.20 cm/s TAPSE (M-mode): 3.0 cm LEFT ATRIUM             Index       RIGHT ATRIUM           Index LA diam:        2.40 cm 1.48 cm/m  RA Area:     25.70 cm LA Vol (A2C):   21.1 ml 13.02 ml/m RA Volume:   96.00 ml  59.23 ml/m LA Vol (A4C):   9.6 ml  5.94 ml/m LA Biplane Vol: 14.6 ml 9.01 ml/m  AORTIC VALVE                   PULMONIC VALVE AV Area (Vmax):    1.61 cm    PV Vmax:        0.42 m/s AV Area (Vmean):   1.45 cm    PV Peak grad:   0.7 mmHg AV Area (VTI):      1.65 cm    RVOT Peak grad: 1 mmHg AV Vmax:           113.67 cm/s AV Vmean:          86.200 cm/s AV VTI:            0.186 m AV Peak Grad:      5.2 mmHg AV Mean Grad:      3.7 mmHg LVOT Vmax:         58.40 cm/s LVOT Vmean:  39.900 cm/s LVOT VTI:          0.098 m LVOT/AV VTI ratio: 0.52  AORTA Ao Root diam: 2.90 cm MITRAL VALVE                        TRICUSPID VALVE MV Area (PHT): 6.32 cm             TR Peak grad:   52.6 mmHg MV PHT:        34.80 msec           TR Vmax:        364.00 cm/s MV Decel Time: 120 msec MV E velocity: 53.40 cm/s 103 cm/s  SHUNTS MV A velocity: 94.10 cm/s 70.3 cm/s Systemic VTI:  0.10 m MV E/A ratio:  0.57       1.5       Systemic Diam: 2.00 cm  Serafina Royals MD Electronically signed by Serafina Royals MD Signature Date/Time: 01/20/2019/4:41:26 PM    Final     ASSESSMENT & PLAN:   Liver metastases ([place]) #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  Primary unclear; CEA/CA 27-29/ca-15-3-elevated; CA 19-9 normal. Discussed with Dr.Olney.   #Recommend further evaluation with PET scan-to identify the primary lesion.  Also recommend foundation 1 testing.   #Patient and sister understand that this is serious illness; when patient will likely need systemic therapy/chemotherapy.  However treatment at this time cannot be defined as work-up is still in progress.  # Bilateral PE-right heart strain; on Lovenox-we will transition to Eliquis; detailed instructions given to patient and sister. STOP lovenox after tonight's dose; start taking eliquis 5 mg BID; starting 12/11 AM  #Elevated LFTs-secondary to underlying malignancy; will check hepatitis panel at next visit  #Weight loss-secondary to malignancy; refer to nutrition  #Anemia-suggestive of anemia of chronic disease; monitor for now; Hb 10- stable.   # DM- on insulin/with episodes of hypoglycemia.; stop metformin; call dr.solum office for further instructions.   # CKD- stage III- sec to DM GFR- 45.   #  Recent stroke -no residual deficits.  On asprin plavix; on anti-coag  # DISPOSITION:  # PET scan ASAP-   # nutrition/joli # follow up MD- 1-2 days post PET scan. Dr.B  Cc; Dr.ReveloWI:[id number]     All questions were answered. The patient knows to call the clinic with any problems, questions or concerns.       [name], MD 01/31/2019 7:10 AM

## 2019-01-30 NOTE — Assessment & Plan Note (Addendum)
#  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  Primary unclear; CEA/CA 27-29/ca-15-3-elevated; CA 19-9 normal. Discussed with Dr.Olney.   #Recommend further evaluation with PET scan-to identify the primary lesion.  Also recommend foundation 1 testing.   #Patient and sister understand that this is serious illness; when patient will likely need systemic therapy/chemotherapy.  However treatment at this time cannot be defined as work-up is still in progress.  # Bilateral PE-right heart strain; on Lovenox-we will transition to Eliquis; detailed instructions given to patient and sister. STOP lovenox after tonight's dose; start taking eliquis 5 mg BID; starting 12/11 AM  #Elevated LFTs-secondary to underlying malignancy; will check hepatitis panel at next visit  #Weight loss-secondary to malignancy; refer to nutrition  #Anemia-suggestive of anemia of chronic disease; monitor for now; Hb 10- stable.   # DM- on insulin/with episodes of hypoglycemia.; stop metformin; call dr.solum office for further instructions.   # CKD- stage III- sec to DM GFR- 45.   # Recent stroke -no residual deficits.  On asprin plavix; on anti-coag  # DISPOSITION:  # PET scan ASAP-   # nutrition/joli # follow up MD- 1-2 days post PET scan. Dr.B  Cc; Dr.Revelo- [phone]

## 2019-01-30 NOTE — Patient Instructions (Signed)
#   STOP lovenox after tonight's dose; start taking eliquis 5 mg BID; starting 12/11 AM  # STOP metformin; speak to Dr.Solum re: adjustment of diabetic medications sec to low blood sugars.

## 2019-01-31 ENCOUNTER — Telehealth: Payer: Self-pay | Admitting: Internal Medicine

## 2019-01-31 NOTE — Telephone Encounter (Signed)
I tried to call pt's PCP at [facility]. unable to speak to office/phone keeps ringing.  T- please reach out to above office on 12/14; and leave my number with staff to call me back to discuss re: this pt's plan of care.  Thanks GB

## 2019-02-02 ENCOUNTER — Other Ambulatory Visit: Payer: Self-pay

## 2019-02-02 ENCOUNTER — Emergency Department: Payer: Medicare PPO

## 2019-02-02 ENCOUNTER — Inpatient Hospital Stay
Admission: EM | Admit: 2019-02-02 | Discharge: 2019-02-06 | DRG: 175 | Disposition: A | Discharge status: Hospice | Payer: Medicare PPO | Attending: Family Medicine | Admitting: Family Medicine

## 2019-02-02 ENCOUNTER — Inpatient Hospital Stay: Payer: Medicare PPO

## 2019-02-02 ENCOUNTER — Encounter: Payer: Self-pay | Admitting: Emergency Medicine

## 2019-02-02 DIAGNOSIS — K219 Gastro-esophageal reflux disease without esophagitis: Secondary | ICD-10-CM | POA: Diagnosis present

## 2019-02-02 DIAGNOSIS — I361 Nonrheumatic tricuspid (valve) insufficiency: Secondary | ICD-10-CM | POA: Diagnosis not present

## 2019-02-02 DIAGNOSIS — Z515 Encounter for palliative care: Secondary | ICD-10-CM | POA: Diagnosis not present

## 2019-02-02 DIAGNOSIS — E11649 Type 2 diabetes mellitus with hypoglycemia without coma: Secondary | ICD-10-CM | POA: Diagnosis not present

## 2019-02-02 DIAGNOSIS — N1832 Chronic kidney disease, stage 3b: Secondary | ICD-10-CM | POA: Diagnosis not present

## 2019-02-02 DIAGNOSIS — J209 Acute bronchitis, unspecified: Secondary | ICD-10-CM

## 2019-02-02 DIAGNOSIS — C787 Secondary malignant neoplasm of liver and intrahepatic bile duct: Secondary | ICD-10-CM | POA: Diagnosis present

## 2019-02-02 DIAGNOSIS — E119 Type 2 diabetes mellitus without complications: Secondary | ICD-10-CM | POA: Diagnosis not present

## 2019-02-02 DIAGNOSIS — Z7901 Long term (current) use of anticoagulants: Secondary | ICD-10-CM

## 2019-02-02 DIAGNOSIS — R4182 Altered mental status, unspecified: Secondary | ICD-10-CM

## 2019-02-02 DIAGNOSIS — G894 Chronic pain syndrome: Secondary | ICD-10-CM | POA: Diagnosis present

## 2019-02-02 DIAGNOSIS — G9341 Metabolic encephalopathy: Secondary | ICD-10-CM | POA: Diagnosis present

## 2019-02-02 DIAGNOSIS — R778 Other specified abnormalities of plasma proteins: Secondary | ICD-10-CM

## 2019-02-02 DIAGNOSIS — E1122 Type 2 diabetes mellitus with diabetic chronic kidney disease: Secondary | ICD-10-CM | POA: Diagnosis present

## 2019-02-02 DIAGNOSIS — Z20828 Contact with and (suspected) exposure to other viral communicable diseases: Secondary | ICD-10-CM | POA: Diagnosis present

## 2019-02-02 DIAGNOSIS — Z981 Arthrodesis status: Secondary | ICD-10-CM

## 2019-02-02 DIAGNOSIS — Z9981 Dependence on supplemental oxygen: Secondary | ICD-10-CM

## 2019-02-02 DIAGNOSIS — J9611 Chronic respiratory failure with hypoxia: Secondary | ICD-10-CM | POA: Diagnosis not present

## 2019-02-02 DIAGNOSIS — R64 Cachexia: Secondary | ICD-10-CM | POA: Diagnosis present

## 2019-02-02 DIAGNOSIS — Z79899 Other long term (current) drug therapy: Secondary | ICD-10-CM

## 2019-02-02 DIAGNOSIS — I63119 Cerebral infarction due to embolism of unspecified vertebral artery: Secondary | ICD-10-CM | POA: Diagnosis not present

## 2019-02-02 DIAGNOSIS — E1142 Type 2 diabetes mellitus with diabetic polyneuropathy: Secondary | ICD-10-CM | POA: Diagnosis present

## 2019-02-02 DIAGNOSIS — I63443 Cerebral infarction due to embolism of bilateral cerebellar arteries: Secondary | ICD-10-CM | POA: Diagnosis not present

## 2019-02-02 DIAGNOSIS — C801 Malignant (primary) neoplasm, unspecified: Secondary | ICD-10-CM

## 2019-02-02 DIAGNOSIS — M4716 Other spondylosis with myelopathy, lumbar region: Secondary | ICD-10-CM | POA: Diagnosis present

## 2019-02-02 DIAGNOSIS — Z794 Long term (current) use of insulin: Secondary | ICD-10-CM | POA: Diagnosis not present

## 2019-02-02 DIAGNOSIS — R0602 Shortness of breath: Secondary | ICD-10-CM | POA: Diagnosis present

## 2019-02-02 DIAGNOSIS — I639 Cerebral infarction, unspecified: Secondary | ICD-10-CM | POA: Diagnosis not present

## 2019-02-02 DIAGNOSIS — Z90710 Acquired absence of both cervix and uterus: Secondary | ICD-10-CM

## 2019-02-02 DIAGNOSIS — F05 Delirium due to known physiological condition: Secondary | ICD-10-CM | POA: Diagnosis not present

## 2019-02-02 DIAGNOSIS — D649 Anemia, unspecified: Secondary | ICD-10-CM | POA: Diagnosis present

## 2019-02-02 DIAGNOSIS — I501 Left ventricular failure: Secondary | ICD-10-CM | POA: Diagnosis present

## 2019-02-02 DIAGNOSIS — I130 Hypertensive heart and chronic kidney disease with heart failure and stage 1 through stage 4 chronic kidney disease, or unspecified chronic kidney disease: Secondary | ICD-10-CM | POA: Diagnosis present

## 2019-02-02 DIAGNOSIS — Z6820 Body mass index (BMI) 20.0-20.9, adult: Secondary | ICD-10-CM

## 2019-02-02 DIAGNOSIS — N183 Chronic kidney disease, stage 3 unspecified: Secondary | ICD-10-CM | POA: Diagnosis present

## 2019-02-02 DIAGNOSIS — Z8619 Personal history of other infectious and parasitic diseases: Secondary | ICD-10-CM

## 2019-02-02 DIAGNOSIS — R339 Retention of urine, unspecified: Secondary | ICD-10-CM | POA: Diagnosis not present

## 2019-02-02 DIAGNOSIS — I50811 Acute right heart failure: Secondary | ICD-10-CM

## 2019-02-02 DIAGNOSIS — J309 Allergic rhinitis, unspecified: Secondary | ICD-10-CM | POA: Diagnosis present

## 2019-02-02 DIAGNOSIS — Z8719 Personal history of other diseases of the digestive system: Secondary | ICD-10-CM

## 2019-02-02 DIAGNOSIS — Z86711 Personal history of pulmonary embolism: Secondary | ICD-10-CM | POA: Diagnosis not present

## 2019-02-02 DIAGNOSIS — J9621 Acute and chronic respiratory failure with hypoxia: Secondary | ICD-10-CM | POA: Diagnosis present

## 2019-02-02 DIAGNOSIS — I2699 Other pulmonary embolism without acute cor pulmonale: Secondary | ICD-10-CM

## 2019-02-02 DIAGNOSIS — J9600 Acute respiratory failure, unspecified whether with hypoxia or hypercapnia: Secondary | ICD-10-CM | POA: Diagnosis present

## 2019-02-02 DIAGNOSIS — G9340 Encephalopathy, unspecified: Secondary | ICD-10-CM | POA: Diagnosis not present

## 2019-02-02 DIAGNOSIS — Z801 Family history of malignant neoplasm of trachea, bronchus and lung: Secondary | ICD-10-CM

## 2019-02-02 DIAGNOSIS — R569 Unspecified convulsions: Secondary | ICD-10-CM | POA: Diagnosis not present

## 2019-02-02 DIAGNOSIS — N1831 Chronic kidney disease, stage 3a: Secondary | ICD-10-CM | POA: Diagnosis present

## 2019-02-02 DIAGNOSIS — Z66 Do not resuscitate: Secondary | ICD-10-CM | POA: Diagnosis not present

## 2019-02-02 DIAGNOSIS — I2609 Other pulmonary embolism with acute cor pulmonale: Secondary | ICD-10-CM | POA: Diagnosis present

## 2019-02-02 DIAGNOSIS — Z86718 Personal history of other venous thrombosis and embolism: Secondary | ICD-10-CM

## 2019-02-02 DIAGNOSIS — R7309 Other abnormal glucose: Secondary | ICD-10-CM | POA: Diagnosis not present

## 2019-02-02 DIAGNOSIS — N179 Acute kidney failure, unspecified: Secondary | ICD-10-CM | POA: Diagnosis present

## 2019-02-02 DIAGNOSIS — E109 Type 1 diabetes mellitus without complications: Secondary | ICD-10-CM | POA: Diagnosis not present

## 2019-02-02 DIAGNOSIS — I10 Essential (primary) hypertension: Secondary | ICD-10-CM | POA: Diagnosis present

## 2019-02-02 DIAGNOSIS — J440 Chronic obstructive pulmonary disease with acute lower respiratory infection: Secondary | ICD-10-CM | POA: Diagnosis present

## 2019-02-02 DIAGNOSIS — I6340 Cerebral infarction due to embolism of unspecified cerebral artery: Secondary | ICD-10-CM | POA: Diagnosis not present

## 2019-02-02 DIAGNOSIS — R7989 Other specified abnormal findings of blood chemistry: Secondary | ICD-10-CM

## 2019-02-02 DIAGNOSIS — J441 Chronic obstructive pulmonary disease with (acute) exacerbation: Secondary | ICD-10-CM | POA: Diagnosis present

## 2019-02-02 DIAGNOSIS — Z9049 Acquired absence of other specified parts of digestive tract: Secondary | ICD-10-CM

## 2019-02-02 DIAGNOSIS — Z87891 Personal history of nicotine dependence: Secondary | ICD-10-CM

## 2019-02-02 DIAGNOSIS — R1310 Dysphagia, unspecified: Secondary | ICD-10-CM | POA: Diagnosis present

## 2019-02-02 DIAGNOSIS — E1165 Type 2 diabetes mellitus with hyperglycemia: Secondary | ICD-10-CM | POA: Diagnosis present

## 2019-02-02 DIAGNOSIS — Z8673 Personal history of transient ischemic attack (TIA), and cerebral infarction without residual deficits: Secondary | ICD-10-CM | POA: Diagnosis not present

## 2019-02-02 DIAGNOSIS — Z7189 Other specified counseling: Secondary | ICD-10-CM

## 2019-02-02 LAB — CBC WITH DIFFERENTIAL/PLATELET
Abs Immature Granulocytes: 0.05 10*3/uL (ref 0.00–0.07)
Basophils Absolute: 0.1 10*3/uL (ref 0.0–0.1)
Basophils Relative: 1 %
Eosinophils Absolute: 0.1 10*3/uL (ref 0.0–0.5)
Eosinophils Relative: 1 %
HCT: 28.8 % — ABNORMAL LOW (ref 36.0–46.0)
Hemoglobin: 9.6 g/dL — ABNORMAL LOW (ref 12.0–15.0)
Immature Granulocytes: 1 %
Lymphocytes Relative: 16 %
Lymphs Abs: 1.7 10*3/uL (ref 0.7–4.0)
MCH: 22.5 pg — ABNORMAL LOW (ref 26.0–34.0)
MCHC: 33.3 g/dL (ref 30.0–36.0)
MCV: 67.4 fL — ABNORMAL LOW (ref 80.0–100.0)
Monocytes Absolute: 1 10*3/uL (ref 0.1–1.0)
Monocytes Relative: 9 %
Neutro Abs: 7.8 10*3/uL — ABNORMAL HIGH (ref 1.7–7.7)
Neutrophils Relative %: 72 %
Platelets: 374 10*3/uL (ref 150–400)
RBC: 4.27 MIL/uL (ref 3.87–5.11)
RDW: 15.9 % — ABNORMAL HIGH (ref 11.5–15.5)
WBC: 10.8 10*3/uL — ABNORMAL HIGH (ref 4.0–10.5)
nRBC: 0 % (ref 0.0–0.2)

## 2019-02-02 LAB — HEPARIN LEVEL (UNFRACTIONATED): Heparin Unfractionated: 2.46 [IU]/mL — ABNORMAL HIGH (ref 0.30–0.70)

## 2019-02-02 LAB — COMPREHENSIVE METABOLIC PANEL
ALT: 92 U/L — ABNORMAL HIGH (ref 0–44)
AST: 130 U/L — ABNORMAL HIGH (ref 15–41)
Albumin: 3.2 g/dL — ABNORMAL LOW (ref 3.5–5.0)
Alkaline Phosphatase: 1819 U/L — ABNORMAL HIGH (ref 38–126)
Anion gap: 13 (ref 5–15)
BUN: 17 mg/dL (ref 8–23)
CO2: 18 mmol/L — ABNORMAL LOW (ref 22–32)
Calcium: 9 mg/dL (ref 8.9–10.3)
Chloride: 104 mmol/L (ref 98–111)
Creatinine, Ser: 1.24 mg/dL — ABNORMAL HIGH (ref 0.44–1.00)
GFR calc Af Amer: 51 mL/min — ABNORMAL LOW (ref >60)
GFR calc non Af Amer: 44 mL/min — ABNORMAL LOW (ref >60)
Glucose, Bld: 238 mg/dL — ABNORMAL HIGH (ref 70–99)
Potassium: 3.9 mmol/L (ref 3.5–5.1)
Sodium: 135 mmol/L (ref 135–145)
Total Bilirubin: 1 mg/dL (ref 0.3–1.2)
Total Protein: 7.4 g/dL (ref 6.5–8.1)

## 2019-02-02 LAB — TROPONIN I (HIGH SENSITIVITY)
Troponin I (High Sensitivity): 585 ng/L (ref <18)
Troponin I (High Sensitivity): 506 ng/L (ref <18)

## 2019-02-02 LAB — BRAIN NATRIURETIC PEPTIDE: B Natriuretic Peptide: 995 pg/mL — ABNORMAL HIGH (ref 0.0–100.0)

## 2019-02-02 LAB — POC SARS CORONAVIRUS 2 AG: SARS Coronavirus 2 Ag: NEGATIVE

## 2019-02-02 LAB — APTT: aPTT: 39 s — ABNORMAL HIGH (ref 24–36)

## 2019-02-02 LAB — PROTIME-INR
INR: 1.6 — ABNORMAL HIGH (ref 0.8–1.2)
Prothrombin Time: 18.6 seconds — ABNORMAL HIGH (ref 11.4–15.2)

## 2019-02-02 MED ORDER — ACETAMINOPHEN 500 MG PO TABS: 500.0000 mg | ORAL_TABLET | Freq: Four times a day (QID) | ORAL | Status: DC | PRN

## 2019-02-02 MED ORDER — SODIUM CHLORIDE 0.9% FLUSH: 3.0000 mL | INTRAVENOUS | Status: DC | PRN

## 2019-02-02 MED ORDER — HEPARIN (PORCINE) 25000 UT/250ML-% IV SOLN: 1000.0000 [IU]/h | INTRAVENOUS | Status: DC

## 2019-02-02 MED ORDER — SODIUM CHLORIDE 0.9 % IV SOLN: 250.0000 mL | INTRAVENOUS | Status: DC | PRN

## 2019-02-02 MED ORDER — HEPARIN (PORCINE) 25000 UT/250ML-% IV SOLN
1000.0000 [IU]/h | INTRAVENOUS | Status: DC
  Administered 2019-02-02: 1000 [IU]/h via INTRAVENOUS
  Filled 2019-02-02 (×2): qty 250

## 2019-02-02 MED ORDER — INSULIN ASPART 100 UNIT/ML ~~LOC~~ SOLN
0.0000 [IU] | SUBCUTANEOUS | Status: DC
  Administered 2019-02-03: 5 [IU] via SUBCUTANEOUS
  Administered 2019-02-03: 9 [IU] via SUBCUTANEOUS
  Administered 2019-02-03 (×2): 3 [IU] via SUBCUTANEOUS
  Administered 2019-02-03: 5 [IU] via SUBCUTANEOUS
  Administered 2019-02-03: 3 [IU] via SUBCUTANEOUS
  Administered 2019-02-04: 21:00:00 9 [IU] via SUBCUTANEOUS
  Administered 2019-02-04: 5 [IU] via SUBCUTANEOUS
  Administered 2019-02-04: 18:00:00 9 [IU] via SUBCUTANEOUS
  Administered 2019-02-04: 3 [IU] via SUBCUTANEOUS
  Administered 2019-02-04: 04:00:00 2 [IU] via SUBCUTANEOUS
  Administered 2019-02-05: 3 [IU] via SUBCUTANEOUS
  Administered 2019-02-05: 9 [IU] via SUBCUTANEOUS
  Administered 2019-02-05: 3 [IU] via SUBCUTANEOUS
  Filled 2019-02-02 (×14): qty 1

## 2019-02-02 MED ORDER — HEPARIN BOLUS VIA INFUSION
3000.0000 [IU] | Freq: Once | INTRAVENOUS | Status: AC
  Administered 2019-02-02: 3000 [IU] via INTRAVENOUS
  Filled 2019-02-02: qty 3000

## 2019-02-02 MED ORDER — METOPROLOL TARTRATE 25 MG PO TABS
12.5000 mg | ORAL_TABLET | Freq: Two times a day (BID) | ORAL | Status: DC
  Administered 2019-02-02 – 2019-02-06 (×8): 12.5 mg via ORAL
  Filled 2019-02-02 (×9): qty 1

## 2019-02-02 MED ORDER — HEPARIN BOLUS VIA INFUSION
3000.0000 [IU] | Freq: Once | INTRAVENOUS | Status: DC
  Filled 2019-02-02: qty 3000

## 2019-02-02 MED ORDER — IPRATROPIUM-ALBUTEROL 0.5-2.5 (3) MG/3ML IN SOLN
3.0000 mL | Freq: Four times a day (QID) | RESPIRATORY_TRACT | Status: DC | PRN
  Administered 2019-02-05: 3 mL via RESPIRATORY_TRACT
  Filled 2019-02-02: qty 3

## 2019-02-02 MED ORDER — METHYLPREDNISOLONE SODIUM SUCC 40 MG IJ SOLR
40.0000 mg | Freq: Two times a day (BID) | INTRAMUSCULAR | Status: DC
  Administered 2019-02-02 – 2019-02-04 (×4): 40 mg via INTRAVENOUS
  Filled 2019-02-02 (×4): qty 1

## 2019-02-02 MED ORDER — SODIUM CHLORIDE 0.9 % IV SOLN
2.0000 g | INTRAVENOUS | Status: DC
  Administered 2019-02-02 – 2019-02-03 (×2): 2 g via INTRAVENOUS
  Filled 2019-02-02 (×5): qty 20

## 2019-02-02 MED ORDER — METHYLPREDNISOLONE SODIUM SUCC 125 MG IJ SOLR: 125.0000 mg | Freq: Once | INTRAMUSCULAR | Status: DC

## 2019-02-02 MED ORDER — HEPARIN BOLUS VIA INFUSION
2000.0000 [IU] | Freq: Once | INTRAVENOUS | Status: DC
  Filled 2019-02-02: qty 2000

## 2019-02-02 MED ORDER — SODIUM CHLORIDE 0.9% FLUSH
3.0000 mL | Freq: Two times a day (BID) | INTRAVENOUS | Status: DC
  Administered 2019-02-03 – 2019-02-06 (×7): 3 mL via INTRAVENOUS

## 2019-02-02 MED ORDER — IOHEXOL 350 MG/ML SOLN
60.0000 mL | Freq: Once | INTRAVENOUS | Status: AC | PRN
  Administered 2019-02-02: 60 mL via INTRAVENOUS

## 2019-02-02 NOTE — ED Notes (Signed)
Call to family member [name] and she spoke with EDP

## 2019-02-02 NOTE — ED Provider Notes (Signed)
[facility] Emergency Department Provider Note   ____________________________________________   First MD Initiated Contact with Patient 02/02/19 1840     (approximate)  I have reviewed the triage vital signs and the nursing notes.   HISTORY  Chief Complaint Shortness of Breath and Abdominal Pain    HPI Audrey Chan is a 68 y.o. female with past medical history of PE on Eliquis, hypertension, diabetes who presents to the ED for shortness of breath.  History is limited secondary to patient's confusion.  Per EMS family had been concerned that her worsening shortness of breath over the past 2 to 3 days.  Patient admits to feeling short of breath, but denies any fevers, cough, chest pain, abdominal pain, vomiting, or diarrhea.  She is not aware of any sick contacts.  Patient sister states over the phone that she has been more short of breath over the past few days, despite taking her blood thinners and using oxygen regularly.  She has also seemed more confused over the past couple of days, which sister states had happened in the past but was better following patient's recent admission to the hospital.  Patient was recently admitted for PE and associated right heart strain, for which she underwent thrombectomy.  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        Past Medical History:  Diagnosis Date  . Allergic rhinitis   . Aneurysm of unspecified site ([place])   . Cholelithiasis   . Diabetes type 2, controlled ([place])   . GERD (gastroesophageal reflux disease)   . Hypertension   . Leg DVT (deep venous thromboembolism), acute, bilateral ([place]) 01/21/2019  . Multiple subsegmental pulmonary emboli without acute cor pulmonale ([place]) 01/20/2019  . Neuropathy   . Veno-occlusive disease     Patient Active Problem List   Diagnosis Date Noted  . Acute on chronic respiratory failure with hypoxia ([place]) 02/02/2019  . History of  pulmonary embolism 02/02/2019  . History of CVA (cerebrovascular accident) without residual deficits 02/02/2019  . Pulmonary embolism ([place]) 02/02/2019  . Cor pulmonale, acute ([place]) 02/02/2019  . COPD with acute bronchitis ([place]) 02/02/2019  . Elevated troponin 02/02/2019  . Malnutrition of moderate degree 01/23/2019  . Liver lesion 01/21/2019  . Leg DVT (deep venous thromboembolism), acute, bilateral ([place]) 01/21/2019  . Multiple subsegmental pulmonary emboli without acute cor pulmonale ([place]) 01/20/2019  . CKD (chronic kidney disease), stage III 01/20/2019  . Prolonged QT interval 01/20/2019  . Acute respiratory failure with hypoxemia ([place])   . Liver metastases ([place])   . Elevated LFTs   . Uncontrolled hypertension 01/03/2019  . Lobar pneumonia ([place]) 01/03/2019  . Cerebellar stroke, acute ([place]) 01/03/2019  . DKA (diabetic ketoacidoses) ([place]) 12/30/2018  . Acute metabolic encephalopathy [id number]  . Sepsis with acute renal failure without septic shock ([place])   . AKI (acute kidney injury) ([place])   . Hyponatremia   . Hyperkalemia   . DDD (degenerative disc disease), lumbar 08/08/2018  . Acute bilateral low back pain with bilateral sciatica 08/08/2018  . Lumbar spondylosis with myelopathy 08/08/2018  . Chronic pain syndrome 08/08/2018  . Lung nodules 01/28/2015  . Supraclavicular adenopathy 01/21/2015  . Abnormal MRI, lumbar spine 01/01/2015  . Weight loss, unintentional 01/01/2015  . Lymphocytosis 01/01/2015  . Microcytic red blood cells 01/01/2015  . Recurrent biliary colic [id number]  . Calculus of gallbladder with acute on chronic cholecystitis without obstruction 12/22/2014  . Encounter for  fitting or adjustment of insulin pump 08/09/2014  . Diabetic peripheral neuropathy associated with type 2 diabetes mellitus ([place]) 08/09/2014  . Hypertension 05/24/2012  . Insulin dependent type 2 diabetes mellitus ([place]) 05/24/2012  . Idiopathic localized osteoarthropathy 04/09/2012  .  Dermatitis, eczematoid 02/27/2012  . Can't get food down 09/26/2011    Past Surgical History:  Procedure Laterality Date  . ABDOMINAL HYSTERECTOMY  1972  . CERVICAL FUSION    . CHOLECYSTECTOMY N/A 12/28/2014   Procedure: LAPAROSCOPIC CHOLECYSTECTOMY;  Surgeon: Sherri Rad, MD;  Location: [facility] ORS;  Service: General;  Laterality: N/A;  . EYE SURGERY     detached retina  . MASS EXCISION Left 05/06/2018   Procedure: EXCISION OF LEFT ANTECUBITAL CYST, DIABETIC;  Surgeon: Vickie Epley, MD;  Location: [facility] ORS;  Service: Vascular;  Laterality: Left;  . PULMONARY THROMBECTOMY Bilateral 01/22/2019   Procedure: PULMONARY THROMBECTOMY;  Surgeon: Algernon Huxley, MD;  Location: [facility];  Service: Cardiovascular;  Laterality: Bilateral;    Prior to Admission medications   Medication Sig Start Date End Date Taking? Authorizing Provider  acetaminophen (TYLENOL) 500 MG tablet Take 500-1,000 mg by mouth every 6 (six) hours as needed for moderate pain.    Yes [provider]  albuterol (PROVENTIL HFA;VENTOLIN HFA) 108 (90 BASE) MCG/ACT inhaler Inhale 2 puffs into the lungs every 6 (six) hours as needed for wheezing or shortness of breath.    Yes [provider]  amLODipine (NORVASC) 5 MG tablet Take 1 tablet (5 mg total) by mouth daily. 01/04/19  Yes Dhungel, Nishant, MD  apixaban (ELIQUIS) 5 MG TABS tablet Take 1 tablet (5 mg total) by mouth 2 (two) times daily. 01/30/19  Yes [name], MD  cetirizine (ZYRTEC) 10 MG tablet Take 10 mg by mouth daily.    Yes [provider]  Cholecalciferol (VITAMIN D3) 1000 units CAPS Take 1,000 Units by mouth daily.    Yes [provider]  enoxaparin (LOVENOX) 60 MG/0.6ML injection Inject 0.55 mLs (55 mg total) into the skin 2 (two) times daily for 9 days. 01/25/19 02/03/19 Yes [name], MD  ferrous sulfate 325 (65 FE) MG tablet Take 325 mg by mouth 2 (two) times daily with a meal.    Yes [provider]   fluticasone (FLONASE) 50 MCG/ACT nasal spray Place 2 sprays into both nostrils daily as needed for allergies or rhinitis.    Yes [provider]  gabapentin (NEURONTIN) 800 MG tablet Take 800 mg by mouth 3 (three) times daily.  12/26/16  Yes [provider]  insulin aspart (NOVOLOG) 100 UNIT/ML injection Check blood sugar prior to meals and give insulin on the following scale: Blood sugar less than 150: 0 units BS 150-200: 2 units BS 200-250: 3 units BS 250-300: 4 units BS 300-350: 5 units BS 350-400: 6 units BS above 400: 8 units and call your doctor. 01/07/19  Yes Carrie Mew, MD  ipratropium-albuterol (DUONEB) 0.5-2.5 (3) MG/3ML SOLN Take 3 mLs by nebulization every 6 (six) hours as needed. Patient taking differently: Take 3 mLs by nebulization every 6 (six) hours as needed (wheezing/ shortness of breath).  01/25/19  Yes [name], MD  LANTUS SOLOSTAR 100 UNIT/ML Solostar Pen Inject 25 Units into the skin at bedtime. 01/07/19  Yes Carrie Mew, MD  lisinopril (ZESTRIL) 10 MG tablet Take 1 tablet (10 mg total) by mouth daily. 01/25/19  Yes [name], MD  metFORMIN (GLUCOPHAGE-XR) 500 MG 24 hr tablet Take 500 mg by  mouth daily with breakfast.   Yes [provider]  metoprolol tartrate (LOPRESSOR) 25 MG tablet Take 12.5 mg by mouth 2 (two) times daily.   Yes [provider]  mometasone (ELOCON) 0.1 % cream Apply 1 application topically 2 (two) times daily as needed (rash).  10/08/17  Yes [provider]  Multiple Vitamin (MULTI-VITAMINS) TABS Take 1 tablet by mouth daily.   Yes [provider]  ondansetron (ZOFRAN) 8 MG tablet One pill every 8 hours as needed for nausea/vomitting. 01/30/19  Yes [name], MD  polyethylene glycol (MIRALAX / GLYCOLAX) 17 g packet Take 17 g by mouth daily as needed for moderate constipation.  10/08/15  Yes [provider]  rosuvastatin (CRESTOR) 20 MG tablet Take 20 mg by mouth  daily.  05/02/13  Yes [provider]  umeclidinium-vilanterol (ANORO ELLIPTA) 62.5-25 MCG/INH AEPB Inhale 1 puff into the lungs daily as needed (shortness of breath).    Yes [provider]    Allergies Patient has no known allergies.  Family History  Problem Relation Age of Onset  . Lung cancer Father     Social History Social History   Tobacco Use  . Smoking status: Former Smoker    Quit date: 02/21/1999    Years since quitting: 19.9  . Smokeless tobacco: Never Used  Substance Use Topics  . Alcohol use: No    Alcohol/week: 0.0 standard drinks  . Drug use: No    Review of Systems  Constitutional: No fever/chills Eyes: No visual changes. ENT: No sore throat. Cardiovascular: Denies chest pain. Respiratory: Positive for shortness of breath. Gastrointestinal: No abdominal pain.  No nausea, no vomiting.  No diarrhea.  No constipation. Genitourinary: Negative for dysuria. Musculoskeletal: Negative for back pain. Skin: Negative for rash. Neurological: Negative for headaches, focal weakness or numbness.  ____________________________________________   PHYSICAL EXAM:  VITAL SIGNS: ED Triage Vitals  Enc Vitals Group     BP      Pulse      Resp      Temp      Temp src      SpO2      Weight      Height      Head Circumference      Peak Flow      Pain Score      Pain Loc      Pain Edu?      Excl. in [place]?     Constitutional: Alert and oriented. Eyes: Conjunctivae are normal. Head: Atraumatic. Nose: No congestion/rhinnorhea. Mouth/Throat: Mucous membranes are moist. Neck: Normal ROM Cardiovascular: Normal rate, regular rhythm. Grossly normal heart sounds. Respiratory: Normal respiratory effort.  No retractions. Lungs CTAB. Gastrointestinal: Soft and tender to palpation in the right upper quadrant with no rebound or guarding. No distention. Genitourinary: deferred Musculoskeletal: No lower extremity tenderness nor edema. Neurologic:  Normal  speech and language. No gross focal neurologic deficits are appreciated. Skin:  Skin is warm, dry and intact. No rash noted. Psychiatric: Mood and affect are normal. Speech and behavior are normal.  ____________________________________________   LABS (all labs ordered are listed, but only abnormal results are displayed)  Labs Reviewed  COMPREHENSIVE METABOLIC PANEL - Abnormal; Notable for the following components:      Result Value   CO2 18 (*)    Glucose, Bld 238 (*)    Creatinine, Ser 1.24 (*)    Albumin 3.2 (*)    AST 130 (*)    ALT 92 (*)  Alkaline Phosphatase 1,819 (*)    GFR calc non Af Amer 44 (*)    GFR calc Af Amer 51 (*)    All other components within normal limits  CBC WITH DIFFERENTIAL/PLATELET - Abnormal; Notable for the following components:   WBC 10.8 (*)    Hemoglobin 9.6 (*)    HCT 28.8 (*)    MCV 67.4 (*)    MCH 22.5 (*)    RDW 15.9 (*)    Neutro Abs 7.8 (*)    All other components within normal limits  BRAIN NATRIURETIC PEPTIDE - Abnormal; Notable for the following components:   B Natriuretic Peptide 995.0 (*)    All other components within normal limits  PROTIME-INR - Abnormal; Notable for the following components:   Prothrombin Time 18.6 (*)    INR 1.6 (*)    All other components within normal limits  APTT - Abnormal; Notable for the following components:   aPTT 39 (*)    All other components within normal limits  HEPARIN LEVEL (UNFRACTIONATED) - Abnormal; Notable for the following components:   Heparin Unfractionated 2.46 (*)    All other components within normal limits  TROPONIN I (HIGH SENSITIVITY) - Abnormal; Notable for the following components:   Troponin I (High Sensitivity) 585 (*)    All other components within normal limits  TROPONIN I (HIGH SENSITIVITY) - Abnormal; Notable for the following components:   Troponin I (High Sensitivity) 506 (*)    All other components within normal limits  SARS CORONAVIRUS 2 (TAT 6-24 HRS)  URINALYSIS,  COMPLETE (UACMP) WITH MICROSCOPIC  CBC  HEPARIN LEVEL (UNFRACTIONATED)  BASIC METABOLIC PANEL  HIV ANTIBODY (ROUTINE TESTING W REFLEX)  APTT  HEMOGLOBIN A1C  POC SARS CORONAVIRUS 2 AG -  ED  POC SARS CORONAVIRUS 2 AG   ____________________________________________  EKG  ED ECG REPORT I, Blake Divine, the attending physician, personally viewed and interpreted this ECG.   Date: 02/02/2019  EKG Time: 19:13  Rate: 92  Rhythm: normal sinus rhythm  Axis: Normal  Intervals:none  ST&T Change: T wave inversions inferiorly and laterally   PROCEDURES  Procedure(s) performed (including Critical Care):  Procedures   ____________________________________________   INITIAL IMPRESSION / ASSESSMENT AND PLAN / ED COURSE       68 year old female presents to the ED with worsening shortness of breath after recent admission for PE with associated right heart strain requiring thrombectomy.  She is not in any clear respiratory distress upon arrival but is requiring 4 L nasal cannula to maintain O2 sats.  No wheezing noted on exam and chest x-ray is negative for acute process.  CTA does show PE, although overall burden is improved from prior, right heart strain does persist.  Given lack of wheezing or pneumonia, there is no apparent alternative explanation to ongoing PE for patient's increasing oxygen requirement.  Troponin is elevated however downtrending from her recent admission.  She remains hemodynamically stable, will start on heparin drip for now.  She is also confused, oriented to person and place but not time or situation.  She states that the year is 1990, but has no focal neurologic deficits on exam.  CT head is negative for acute process and there does not appear to be any infectious process going on.  No clear explanation for her altered mental status.  Case discussed with hospitalist, who accepts patient for admission.      ____________________________________________   FINAL  CLINICAL IMPRESSION(S) / ED DIAGNOSES  Final diagnoses:  Shortness  of breath  Pulmonary embolism with acute cor pulmonale, unspecified chronicity, unspecified pulmonary embolism type (HCC)  Altered mental status, unspecified altered mental status type     ED Discharge Orders    None       Note:  This document was prepared using Dragon voice recognition software and may include unintentional dictation errors.   Blake Divine, MD 02/02/19 [phone]

## 2019-02-02 NOTE — Consult Note (Signed)
ANTICOAGULATION CONSULT NOTE - Initial Consult  Pharmacy Consult for Heparin Drip Indication: pulmonary embolus  No Known Allergies  Patient Measurements: Height: 5\' 6"  (167.6 cm) Weight: 132 lb (59.9 kg) IBW/kg (Calculated) : 59.3 Heparin Dosing Weight: 59.9kg  Vital Signs: Temp: 98.1 F (36.7 C) (12/13 1856) Temp Source: Oral (12/13 1856) BP: 150/82 (12/13 2030) Pulse Rate: 92 (12/13 2036)  Labs: Recent Labs    02/02/19 1929  HGB 9.6*  HCT 28.8*  PLT 374  LABPROT 18.6*  INR 1.6*  CREATININE 1.24*  TROPONINIHS 585*    Estimated Creatinine Clearance: 40.1 mL/min (A) (by C-G formula based on SCr of 1.24 mg/dL (H)).   Medical History: Past Medical History:  Diagnosis Date  . Allergic rhinitis   . Aneurysm of unspecified site ([place])   . Cholelithiasis   . Diabetes type 2, controlled ([place])   . GERD (gastroesophageal reflux disease)   . Hypertension   . Leg DVT (deep venous thromboembolism), acute, bilateral ([place]) 01/21/2019  . Multiple subsegmental pulmonary emboli without acute cor pulmonale ([place]) 01/20/2019  . Neuropathy   . Veno-occlusive disease     Medications:  Unclear on last doses - pt was to take Lovenox 55mg  bid until liver biopsy was performed (12/7) and then transition back to oral Eliquis.   Pt and family members in ED this evening not able to give clear report on last dose of anticoagulant today/yesterday.  Assessment: 66 y old female with bilateral PE - pharmacy has been consulted to initiate/monitor heparin drip.  INR somewhat elevated @ 1.6 (likely due to liver dysfunction) and HGB 9.6.   Goal of Therapy:  Heparin level 0.3-0.7 units/ml Monitor platelets by anticoagulation protocol: Yes   Plan:  Unclear last dose history of either Lovenox or Eliquis as outlined above.    Will obtain baseline Heparin Level and APTT per protocol.    Will give 3000 unit heparin bolus, followed by 1000 units/hr.  Will check APTT in 6 hours per protocol,  as I expect Heparin Level to be elevated.  Will follow daily heparin level/CBC while on heparin drip  Lu Duffel, PharmD, BCPS Clinical Pharmacist 02/02/2019 9:12 PM

## 2019-02-02 NOTE — H&P (Signed)
History and Physical    Audrey Chan [id number] DOB: February 26, 1950 DOA: 02/02/2019  PCP: Theotis Burrow, MD  Patient coming from: Home  Chief Complaint: Shortness of breath, stomach pain  HPI: Audrey Chan is a 68 y.o. female with medical history of COPD and chronic respiratory failure on home O2 at 2 L as well as history of diabetes, CVA without sequela, CKD 3 with recent hospitalization from 01/22/19, to 01/25/2019 for acute PE with thrombectomy, associated DVTs, with incidental finding of hepatic metastases, status post outpatient biopsy on 01/27/2019, who returns to the emergency room with shortness of breath and complaints of abdominal pain.  Patient was noted to be disoriented    Review of Systems: As per HPI otherwise .  History taking limited as patient drowsy, disoriented and unable to participate meaningfully  Past Medical History:  Diagnosis Date  . Allergic rhinitis   . Aneurysm of unspecified site ([place])   . Cholelithiasis   . Diabetes type 2, controlled ([place])   . GERD (gastroesophageal reflux disease)   . Hypertension   . Leg DVT (deep venous thromboembolism), acute, bilateral ([place]) 01/21/2019  . Multiple subsegmental pulmonary emboli without acute cor pulmonale ([place]) 01/20/2019  . Neuropathy   . Veno-occlusive disease     Past Surgical History:  Procedure Laterality Date  . ABDOMINAL HYSTERECTOMY  1972  . CERVICAL FUSION    . CHOLECYSTECTOMY N/A 12/28/2014   Procedure: LAPAROSCOPIC CHOLECYSTECTOMY;  Surgeon: Sherri Rad, MD;  Location: [facility] ORS;  Service: General;  Laterality: N/A;  . EYE SURGERY     detached retina  . MASS EXCISION Left 05/06/2018   Procedure: EXCISION OF LEFT ANTECUBITAL CYST, DIABETIC;  Surgeon: Vickie Epley, MD;  Location: [facility] ORS;  Service: Vascular;  Laterality: Left;  . PULMONARY THROMBECTOMY Bilateral 01/22/2019   Procedure: PULMONARY THROMBECTOMY;  Surgeon: Algernon Huxley, MD;  Location: [facility];  Service:  Cardiovascular;  Laterality: Bilateral;     reports that she quit smoking about 19 years ago. She has never used smokeless tobacco. She reports that she does not drink alcohol or use drugs.  No Known Allergies  Family History  Problem Relation Age of Onset  . Lung cancer Father    Prior to Admission medications   Medication Sig Start Date End Date Taking? Authorizing Provider  acetaminophen (TYLENOL) 500 MG tablet Take 500-1,000 mg by mouth every 6 (six) hours as needed for moderate pain.    Yes [provider]  albuterol (PROVENTIL HFA;VENTOLIN HFA) 108 (90 BASE) MCG/ACT inhaler Inhale 2 puffs into the lungs every 6 (six) hours as needed for wheezing or shortness of breath.    Yes [provider]  amLODipine (NORVASC) 5 MG tablet Take 1 tablet (5 mg total) by mouth daily. 01/04/19  Yes Dhungel, Nishant, MD  apixaban (ELIQUIS) 5 MG TABS tablet Take 1 tablet (5 mg total) by mouth 2 (two) times daily. 01/30/19  Yes [name], MD  cetirizine (ZYRTEC) 10 MG tablet Take 10 mg by mouth daily.    Yes [provider]  Cholecalciferol (VITAMIN D3) 1000 units CAPS Take 1,000 Units by mouth daily.    Yes [provider]  enoxaparin (LOVENOX) 60 MG/0.6ML injection Inject 0.55 mLs (55 mg total) into the skin 2 (two) times daily for 9 days. 01/25/19 02/03/19 Yes [name], MD  ferrous sulfate 325 (65 FE) MG tablet Take 325 mg by mouth 2 (two) times daily with a meal.    Yes  [provider]  fluticasone (FLONASE) 50 MCG/ACT nasal spray Place 2 sprays into both nostrils daily as needed for allergies or rhinitis.    Yes [provider]  gabapentin (NEURONTIN) 800 MG tablet Take 800 mg by mouth 3 (three) times daily.  12/26/16  Yes [provider]  insulin aspart (NOVOLOG) 100 UNIT/ML injection Check blood sugar prior to meals and give insulin on the following scale: Blood sugar less than 150: 0 units BS 150-200: 2 units BS 200-250: 3  units BS 250-300: 4 units BS 300-350: 5 units BS 350-400: 6 units BS above 400: 8 units and call your doctor. 01/07/19  Yes Carrie Mew, MD  ipratropium-albuterol (DUONEB) 0.5-2.5 (3) MG/3ML SOLN Take 3 mLs by nebulization every 6 (six) hours as needed. Patient taking differently: Take 3 mLs by nebulization every 6 (six) hours as needed (wheezing/ shortness of breath).  01/25/19  Yes [name], MD  LANTUS SOLOSTAR 100 UNIT/ML Solostar Pen Inject 25 Units into the skin at bedtime. 01/07/19  Yes Carrie Mew, MD  lisinopril (ZESTRIL) 10 MG tablet Take 1 tablet (10 mg total) by mouth daily. 01/25/19  Yes [name], MD  metFORMIN (GLUCOPHAGE-XR) 500 MG 24 hr tablet Take 500 mg by mouth daily with breakfast.   Yes [provider]  metoprolol tartrate (LOPRESSOR) 25 MG tablet Take 12.5 mg by mouth 2 (two) times daily.   Yes [provider]  mometasone (ELOCON) 0.1 % cream Apply 1 application topically 2 (two) times daily as needed (rash).  10/08/17  Yes [provider]  Multiple Vitamin (MULTI-VITAMINS) TABS Take 1 tablet by mouth daily.   Yes [provider]  ondansetron (ZOFRAN) 8 MG tablet One pill every 8 hours as needed for nausea/vomitting. 01/30/19  Yes [name], MD  polyethylene glycol (MIRALAX / GLYCOLAX) 17 g packet Take 17 g by mouth daily as needed for moderate constipation.  10/08/15  Yes [provider]  rosuvastatin (CRESTOR) 20 MG tablet Take 20 mg by mouth daily.  05/02/13  Yes [provider]  umeclidinium-vilanterol (ANORO ELLIPTA) 62.5-25 MCG/INH AEPB Inhale 1 puff into the lungs daily as needed (shortness of breath).    Yes [provider]    Physical Exam: Vitals:   02/02/19 2030 02/02/19 2036 02/02/19 2100 02/02/19 2115  BP: (!) 150/82  140/74   Pulse: 95 92 91 93  Resp:      Temp:      TempSrc:      SpO2: (!) 83% (!) 89% 94% 93%  Weight:      Height:        Constitutional: NAD,  calm, comfortable Vitals:   02/02/19 2030 02/02/19 2036 02/02/19 2100 02/02/19 2115  BP: (!) 150/82  140/74   Pulse: 95 92 91 93  Resp:      Temp:      TempSrc:      SpO2: (!) 83% (!) 89% 94% 93%  Weight:      Height:       constitutional: Patient drowsy but easily arousable.  Oriented to person and place only Eyes: PERRL, ENMT: Mucous membranes are moist. Posterior pharynx clear of any exudate or lesions.Normal dentition.  Neck: normal, supple, Respiratory: Somewhat decreased air entry bilaterally no wheezing, no crackles. Normal respiratory effort. No accessory muscle use.  Cardiovascular: Regular rate and rhythm, no murmurs / rubs / gallops. No extremity edema. 2+ pedal pulses. No carotid bruits.  Abdomen: no tenderness, no masses palpated. No hepatosplenomegaly. Bowel sounds  positive.  Musculoskeletal: no clubbing / cyanosis. No joint deformity upper and lower extremities. Good ROM, no contractures. Normal muscle tone.  Skin: no rashes, no bruises  neurologic:.  No gross focal deficits.  Patient unable to participate in full exam Psychiatric: Alert and oriented x 2.    Labs on Admission: I have personally reviewed following labs and imaging studies  CBC: Recent Labs  Lab 01/27/19 0918 01/30/19 0823 02/02/19 1929  WBC 11.1* 8.8 10.8*  NEUTROABS  --  5.4 7.8*  HGB 9.7* 10.0* 9.6*  HCT 30.1* 32.9* 28.8*  MCV 72.0* 74.4* 67.4*  PLT 263 304 [id number]   Basic Metabolic Panel: Recent Labs  Lab 01/27/19 0918 01/30/19 0823 02/02/19 1929  NA 144 138 135  K 4.5 4.2 3.9  CL 111 105 104  CO2 23 22 18*  GLUCOSE 53* 232* 238*  BUN 12 15 17   CREATININE 1.31* 1.39* 1.24*  CALCIUM 8.9 9.0 9.0   GFR: Estimated Creatinine Clearance: 40.1 mL/min (A) (by C-G formula based on SCr of 1.24 mg/dL (H)). Liver Function Tests: Recent Labs  Lab 01/30/19 0823 02/02/19 1929  AST 123* 130*  ALT 113* 92*  ALKPHOS 1,627* 1,819*  BILITOT 0.6 1.0  PROT 8.0 7.4  ALBUMIN 3.3* 3.2*   No  results for input(s): LIPASE, AMYLASE in the last 168 hours. No results for input(s): AMMONIA in the last 168 hours. Coagulation Profile: Recent Labs  Lab 01/27/19 0918 02/02/19 1929  INR 1.2 1.6*   Cardiac Enzymes: No results for input(s): CKTOTAL, CKMB, CKMBINDEX, TROPONINI in the last 168 hours. BNP (last 3 results) No results for input(s): PROBNP in the last 8760 hours. HbA1C: No results for input(s): HGBA1C in the last 72 hours. CBG: Recent Labs  Lab 01/27/19 0909 01/27/19 0958 01/27/19 1156  GLUCAP 33* 110* 92   Lipid Profile: No results for input(s): CHOL, HDL, LDLCALC, TRIG, CHOLHDL, LDLDIRECT in the last 72 hours. Thyroid Function Tests: No results for input(s): TSH, T4TOTAL, FREET4, T3FREE, THYROIDAB in the last 72 hours. Anemia Panel: No results for input(s): VITAMINB12, FOLATE, FERRITIN, TIBC, IRON, RETICCTPCT in the last 72 hours. Urine analysis:    Component Value Date/Time   COLORURINE YELLOW (A) 01/19/2019 2325   APPEARANCEUR HAZY (A) 01/19/2019 2325   APPEARANCEUR CLEAR 06/07/2014 1233   LABSPEC 1.013 01/19/2019 2325   LABSPEC 1.010 06/07/2014 1233   PHURINE 5.0 01/19/2019 2325   GLUCOSEU NEGATIVE 01/19/2019 2325   GLUCOSEU 500 mg/dL 06/07/2014 1233   HGBUR NEGATIVE 01/19/2019 2325   BILIRUBINUR NEGATIVE 01/19/2019 2325   BILIRUBINUR NEGATIVE 06/07/2014 [place] 01/19/2019 2325   PROTEINUR 30 (A) 01/19/2019 2325   NITRITE NEGATIVE 01/19/2019 2325   LEUKOCYTESUR NEGATIVE 01/19/2019 2325   LEUKOCYTESUR NEGATIVE 06/07/2014 1233    Radiological Exams on Admission: CT Head Wo Contrast  Result Date: 02/02/2019 CLINICAL DATA:  Altered mental status EXAM: CT HEAD WITHOUT CONTRAST TECHNIQUE: Contiguous axial images were obtained from the base of the skull through the vertex without intravenous contrast. COMPARISON:  12/31/2018 FINDINGS: Brain: Chronic atrophic and ischemic changes are again seen and stable. No acute hemorrhage, acute  infarction or space-occupying mass lesion are seen. Vascular: No hyperdense vessel or unexpected calcification. Skull: Normal. Negative for fracture or focal lesion. Sinuses/Orbits: No acute finding. Other: None. IMPRESSION: Chronic atrophic and ischemic changes similar to that seen on the prior exam. No acute abnormality is noted. Electronically Signed   By: Inez Catalina M.D.   On: 02/02/2019 20:31  CT Angio Chest PE W/Cm &/Or Wo Cm  Result Date: 02/02/2019 CLINICAL DATA:  Shortness of breath EXAM: CT ANGIOGRAPHY CHEST WITH CONTRAST TECHNIQUE: Multidetector CT imaging of the chest was performed using the standard protocol during bolus administration of intravenous contrast. Multiplanar CT image reconstructions and MIPs were obtained to evaluate the vascular anatomy. CONTRAST:  38mL OMNIPAQUE IOHEXOL 350 MG/ML SOLN COMPARISON:  01/20/2019 FINDINGS: Cardiovascular: Thoracic aorta demonstrates atherosclerotic calcifications without aneurysmal dilatation or dissection. The heart is at the upper limits of normal in size. The pulmonary artery is well visualized with a normal branching pattern. The previously seen pulmonary emboli are again identified bilaterally with some minimal improvement in the small central branches. No significant resolution of the more peripheral emboli is noted. No new significant emboli are noted. Changes of right heart strain are again noted and stable. Mediastinum/Nodes: The esophagus as visualized is within normal limits. No hilar or mediastinal adenopathy is seen. The thoracic inlet is within normal limits. Lungs/Pleura: Diffuse emphysematous changes are noted throughout both lungs. Scattered subpleural nodular changes are seen without significant increase from the prior study. No focal confluent infiltrate is seen. No sizable effusion is noted. No pneumothorax seen. Upper Abdomen: Visualized upper abdomen demonstrates mild perihepatic fluid stable from the prior exam. Stable hypodense  lesions are noted throughout the liver consistent with the given clinical history of hepatic metastatic disease. Musculoskeletal: Degenerative changes of the thoracic spine are noted. No acute bony abnormality is seen. Review of the MIP images confirms the above findings. IMPRESSION: Slight improvement in pulmonary emboli bilaterally although the peripheral emboli appear stable. No new emboli are seen. Persistent right heart strain. Emphysematous changes without acute infiltrate. Multiple pulmonary nodules stable from the prior exam which may represent some metastatic disease Hepatic metastatic disease. Aortic Atherosclerosis (ICD10-I70.0) and Emphysema (ICD10-J43.9). Electronically Signed   By: Inez Catalina M.D.   On: 02/02/2019 20:39   DG Chest Portable 1 View  Result Date: 02/02/2019 CLINICAL DATA:  Shortness of breath, stomach pain EXAM: PORTABLE CHEST 1 VIEW COMPARISON:  01/23/2019 FINDINGS: The heart size and mediastinal contours are within normal limits. Emphysema. The visualized skeletal structures are unremarkable. IMPRESSION: Emphysema without acute abnormality of the lungs in AP portable projection. Electronically Signed   By: Eddie Candle M.D.   On: 02/02/2019 19:06    EKG: Independently reviewed.  Assessment/Plan    Acute on chronic respiratory failure with hypoxia (HCC) --- Uncertain etiology but likely multifactorial etiology to include acute cor pulmonale from recent PE, pneumonia though not detected on CT scan of the chest, acute bronchitis, possible PE extension though not seen on CT scan. --- Patient had recent liver biopsy on [id number], uncertain whether this is a contributor so will get CT abdomen to eval for intrabdominal pathology ---Baseline O2 requirement is 2 L/min, patient requiring 4 L to keep sats in the 90s. --address each individual possible etiology as outlined below --- Oxygen supplementation to keep sats of 92%    Suspect Cor pulmonale, acute ([place]) secondary to  recent PE --- Patient is status post recent PE with thrombectomy, discharged on 01/25/19 --- BNP elevated at 995, up from 893 at discharge also has elevated troponin of 585 though decreased from last admission and CTA suggested right heart strain --- Echocardiogram November 2020 showed EF 55 to 60% --- Repeat echocardiogram --- IV heparin treatment of PE.  Compliance with Eliquis uncertain --- No ACE or ARB ordered given history of left heart failure --Low-dose IV Lasix ordered  Acute metabolic encephalopathy --- Patient presented with confusion, oriented person and place only --- Likely secondary to acute medical condition --- CT head showed no acute findings    COPD with acute bronchitis ([place]) --- No overt wheezing, possibility of pneumonia or bronchitis attributing to acute respiratory failure --- Right cell count was slightly elevated at 10.8, up from 8.8 a few days prior ---empiric antibiotic treatment with Rocephin.  Azithromycin given due to history of QT prolongation --- Covid test negative   Elevated troponin --- EKG showed no acute ST-T wave changes and this is likely related to demand ischemia suspected right heart strain --- Troponin was elevated to 585 but this is down from her recent hospitalization ---Continue to cycle troponins --- echoCardiogram as above  Essential hypertension --- Blood pressure stable.  Continue home meds    Insulin dependent type 2 diabetes mellitus ([place]) --- Hold home meds.  suppleMental insulin coverage    Liver metastases (HCC) --- No acute problems --- Patient underwent liver biopsy 01/27/2019 --- CT abdomen ordered to evaluate for acute intra-abdominal pathology    CKD (chronic kidney disease), stage III --Renal function at baseline.     History of CVA (cerebrovascular accident) without residual deficits --- Has no focal deficits to suggest new CVA --- Continue aspirin and statins        Athena Masse MD [facility]   If 7PM-7AM, please contact night-coverage [email or web address] Password [facility]  02/02/2019, 9:56 PM

## 2019-02-02 NOTE — ED Triage Notes (Signed)
Pt arrived via EMS from home with reports of shortness of breath and stomach pain.  Pt states she has felt short of breath at home, pt states she also has some stomach pains and an odor to her stomach.  Pt is alert to self and place, but disoriented to time and situation.  Per EMS, pt recently dx with blood clots and recently admitted to ED.  Per EMS pt is confused  Pt has hx of COPD, HTN, Liver CA.

## 2019-02-03 ENCOUNTER — Inpatient Hospital Stay (HOSPITAL_COMMUNITY)
Admit: 2019-02-03 | Discharge: 2019-02-03 | Disposition: A | Discharge status: Home / Self Care | Payer: Medicare PPO | Attending: Internal Medicine | Admitting: Internal Medicine

## 2019-02-03 ENCOUNTER — Encounter: Payer: Self-pay | Admitting: Internal Medicine

## 2019-02-03 ENCOUNTER — Telehealth: Payer: Self-pay | Admitting: Internal Medicine

## 2019-02-03 DIAGNOSIS — N1832 Chronic kidney disease, stage 3b: Secondary | ICD-10-CM

## 2019-02-03 DIAGNOSIS — Z86711 Personal history of pulmonary embolism: Secondary | ICD-10-CM

## 2019-02-03 DIAGNOSIS — Z7189 Other specified counseling: Secondary | ICD-10-CM

## 2019-02-03 DIAGNOSIS — I361 Nonrheumatic tricuspid (valve) insufficiency: Secondary | ICD-10-CM

## 2019-02-03 DIAGNOSIS — E119 Type 2 diabetes mellitus without complications: Secondary | ICD-10-CM

## 2019-02-03 DIAGNOSIS — J9621 Acute and chronic respiratory failure with hypoxia: Secondary | ICD-10-CM

## 2019-02-03 DIAGNOSIS — Z515 Encounter for palliative care: Secondary | ICD-10-CM

## 2019-02-03 DIAGNOSIS — Z8673 Personal history of transient ischemic attack (TIA), and cerebral infarction without residual deficits: Secondary | ICD-10-CM

## 2019-02-03 DIAGNOSIS — C787 Secondary malignant neoplasm of liver and intrahepatic bile duct: Secondary | ICD-10-CM

## 2019-02-03 DIAGNOSIS — G9341 Metabolic encephalopathy: Secondary | ICD-10-CM

## 2019-02-03 DIAGNOSIS — Z794 Long term (current) use of insulin: Secondary | ICD-10-CM

## 2019-02-03 LAB — AMMONIA: Ammonia: 19 umol/L (ref 9–35)

## 2019-02-03 LAB — GLUCOSE, CAPILLARY
Glucose-Capillary: 226 mg/dL — ABNORMAL HIGH (ref 70–99)
Glucose-Capillary: 207 mg/dL — ABNORMAL HIGH (ref 70–99)
Glucose-Capillary: 281 mg/dL — ABNORMAL HIGH (ref 70–99)
Glucose-Capillary: 289 mg/dL — ABNORMAL HIGH (ref 70–99)
Glucose-Capillary: 214 mg/dL — ABNORMAL HIGH (ref 70–99)

## 2019-02-03 LAB — CBC
HCT: 28.8 % — ABNORMAL LOW (ref 36.0–46.0)
Hemoglobin: 9.7 g/dL — ABNORMAL LOW (ref 12.0–15.0)
MCH: 22.7 pg — ABNORMAL LOW (ref 26.0–34.0)
MCHC: 33.7 g/dL (ref 30.0–36.0)
MCV: 67.4 fL — ABNORMAL LOW (ref 80.0–100.0)
Platelets: 376 10*3/uL (ref 150–400)
RBC: 4.27 MIL/uL (ref 3.87–5.11)
RDW: 15.8 % — ABNORMAL HIGH (ref 11.5–15.5)
WBC: 9.8 10*3/uL (ref 4.0–10.5)
nRBC: 0 % (ref 0.0–0.2)

## 2019-02-03 LAB — HEPARIN LEVEL (UNFRACTIONATED): Heparin Unfractionated: 1.73 IU/mL — ABNORMAL HIGH (ref 0.30–0.70)

## 2019-02-03 LAB — ECHOCARDIOGRAM COMPLETE
Height: 66 in
Weight: 2112.01 oz

## 2019-02-03 LAB — BASIC METABOLIC PANEL
Anion gap: 13 (ref 5–15)
BUN: 16 mg/dL (ref 8–23)
CO2: 19 mmol/L — ABNORMAL LOW (ref 22–32)
Calcium: 8.8 mg/dL — ABNORMAL LOW (ref 8.9–10.3)
Chloride: 105 mmol/L (ref 98–111)
Creatinine, Ser: 1.24 mg/dL — ABNORMAL HIGH (ref 0.44–1.00)
GFR calc Af Amer: 51 mL/min — ABNORMAL LOW (ref >60)
GFR calc non Af Amer: 44 mL/min — ABNORMAL LOW (ref >60)
Glucose, Bld: 220 mg/dL — ABNORMAL HIGH (ref 70–99)
Potassium: 3.8 mmol/L (ref 3.5–5.1)
Sodium: 137 mmol/L (ref 135–145)

## 2019-02-03 LAB — APTT
aPTT: 100 seconds — ABNORMAL HIGH (ref 24–36)
aPTT: 158 seconds — ABNORMAL HIGH (ref 24–36)

## 2019-02-03 LAB — HEMOGLOBIN A1C
Hgb A1c MFr Bld: 8.9 % — ABNORMAL HIGH (ref 4.8–5.6)
Mean Plasma Glucose: 208.73 mg/dL

## 2019-02-03 LAB — HIV ANTIBODY (ROUTINE TESTING W REFLEX): HIV Screen 4th Generation wRfx: NONREACTIVE

## 2019-02-03 LAB — SARS CORONAVIRUS 2 (TAT 6-24 HRS): SARS Coronavirus 2: NEGATIVE

## 2019-02-03 LAB — LACTIC ACID, PLASMA: Lactic Acid, Venous: 2.4 mmol/L (ref 0.5–1.9)

## 2019-02-03 MED ORDER — APIXABAN 5 MG PO TABS
5.0000 mg | ORAL_TABLET | Freq: Two times a day (BID) | ORAL | Status: DC
  Administered 2019-02-03 – 2019-02-06 (×6): 5 mg via ORAL
  Filled 2019-02-03 (×7): qty 1

## 2019-02-03 MED ORDER — HEPARIN (PORCINE) 25000 UT/250ML-% IV SOLN: 900.0000 [IU]/h | INTRAVENOUS | Status: DC

## 2019-02-03 MED ORDER — APIXABAN 5 MG PO TABS
5.0000 mg | ORAL_TABLET | Freq: Two times a day (BID) | ORAL | Status: DC
  Filled 2019-02-03: qty 1

## 2019-02-03 MED ORDER — INSULIN ASPART 100 UNIT/ML ~~LOC~~ SOLN
SUBCUTANEOUS | Status: AC
  Filled 2019-02-03: qty 1

## 2019-02-03 MED ORDER — CHLORHEXIDINE GLUCONATE CLOTH 2 % EX PADS
6.0000 | MEDICATED_PAD | Freq: Every day | CUTANEOUS | Status: DC
  Administered 2019-02-04: 21:00:00 6 via TOPICAL

## 2019-02-03 NOTE — Progress Notes (Signed)
Handoff given to [facility] nurse for ICU.

## 2019-02-03 NOTE — Progress Notes (Signed)
Patient transferred from [facility] to room 7 ICU1. Patient alert oriented X 4 on heparin drip. Patient is in bed denies pain at this time. Will continue to monitor patient. Report received from [place], [place].

## 2019-02-03 NOTE — ED Notes (Signed)
Awake, tech helping patient with purewick, speech clear, heparin infusing at 1000u/hr.

## 2019-02-03 NOTE — Progress Notes (Signed)
Inpatient Diabetes Program Recommendations  AACE/ADA: New Consensus Statement on Inpatient Glycemic Control (2015)  Target Ranges:  Prepandial:   less than 140 mg/dL      Peak postprandial:   less than 180 mg/dL (1-2 hours)      Critically ill patients:  140 - 180 mg/dL   Results for KINZY, LEIDECKER (MRN [id number]) as of 02/03/2019 15:17  Ref. Range 02/03/2019 04:02 02/03/2019 08:44 02/03/2019 11:52  Glucose-Capillary Latest Ref Range: 70 - 99 mg/dL 226 (H)  3 units NOVOLOG  207 (H)  3 units NOVOLOG  281 (H)  5 units NOVOLOG      Admit with: Acute on chronic respiratory failure with hypoxia/ Suspect Cor pulmonale, acute secondary to recent PE  Admitted 12/02 and discharged home 12/05 after admission for acute PE with thrombectomy.  History: DM, COPD, CVA, CKD  Home DM Meds: Lantus 25 units QHS       Novolog 0-8 units TID per SSI       Metformin 500 mg Daily  Current Orders: Novolog Sensitive Correction Scale/ SSI (0-9 units) Q4 hours     Getting Solumedrol 40 mg BID.  Novolog SSI started at 4am today.     MD- Please consider adding Lantus 20 units QHS (80% total home dose)--Start tonight     --Will follow patient during hospitalization--  Wyn Quaker RN, MSN, CDE Diabetes Coordinator Inpatient Glycemic Control Team Team Pager: [phone] (8a-5p)

## 2019-02-03 NOTE — Assessment & Plan Note (Addendum)
#   68 year old female patient with a 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  Carcinoma of unknown primary-  CEA/CA 27-29/ca-15-3-elevated; CA 19-9 normal. Awaiting outpatient PET scan; NGS testing results.  Currently s/p weekly dose of carbotaxol 12/16-tolerated fairly well without any acute infusion reactions.  We will plan outpatient treatment next week  # Bilateral PE-right heart strain-currently on Eliquis. STABLE; Currently on 2-3 L of nasal cannula.  # CKD- stage III -STABLE.   #Episodes of confusion-likely metabolic-questionable diabetes/delirium from hospitalizations. Recent MRI brain nov 11th- negative metastasis.  Defer to primary team. I spoke to patient's sister- who is still concerned about patient ongoing confusion. She wants this to be addressed prior to discharge.

## 2019-02-03 NOTE — Progress Notes (Addendum)
Progress Note    Audrey Chan  [id number] DOB: [birth year]  DOA: 02/02/2019 PCP: Theotis Burrow, MD         Assessment/Plan:   Principal Problem:   Acute on chronic respiratory failure with hypoxia ([place]) Active Problems:   Hypertension   Insulin dependent type 2 diabetes mellitus ([place])   Acute metabolic encephalopathy   Liver metastases (HCC)   CKD (chronic kidney disease), stage III   History of pulmonary embolism   History of CVA (cerebrovascular accident) without residual deficits   Pulmonary embolism (HCC)   Cor pulmonale, acute ([place])   COPD with acute bronchitis (HCC)   Elevated troponin   Body mass index is 21.31 kg/m.   Acute on chronic hypoxemic respiratory failure: She said her sometimes she uses oxygen at home but not all the time.  She said she uses up to 3 L but it appears patient is unsure of how much oxygen she uses.  Continue oxygen via nasal  Recent acute pulmonary embolism and bilateral lower extremity DVT (diagnosed on 01/20/2019): Continue IV heparin infusion and transition to Eliquis tonight..  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  CKD stage III: Creatinine is stable.  COPD with probable acute exacerbation: Continue IV steroids, antibiotics and bronchodilators.  Elevated liver enzymes: Probably from metastatic liver disease  Type 2 diabetes mellitus with hyperglycemia: Hemoglobin A1c was 9.5 on 01/20/2019. Continue Lantus.  NovoLog as needed for hyperglycemia.  Hypertension: Continue antihypertensives  History of stroke: Patient will use Eliquis for stroke prophylaxis   Family Communication/Anticipated D/C date and plan/Code Status   DVT prophylaxis: IV heparin Code Status: Full code Family Communication: None Disposition Plan: Possible discharge to home in 2 days     Subjective:   She feels better.  No shortness of breath or chest pain.  She  said she had stopped taking her Eliquis for no reason.  Objective:    Vitals:   02/03/19 0115 02/03/19 0130 02/03/19 0200 02/03/19 0700  BP:   (!) 145/86 (!) 167/94  Pulse: 88 88 88 (!) 108  Resp:    (!) 25  Temp:      TempSrc:      SpO2: 93% 94% 91% (!) 83%  Weight:      Height:       No intake or output data in the 24 hours ending 02/03/19 1054 Filed Weights   02/02/19 1900  Weight: 59.9 kg    Exam:  GEN: NAD SKIN: No rash EYES: EOMI, PERRLA ENT: MMM CV: RRR PULM: CTA B ABD: soft, ND, NT, +BS CNS: AAO x 3, non focal EXT: No edema or tenderness   Data Reviewed:   I have personally reviewed following labs and imaging studies:  Labs: Labs show the following:   Basic Metabolic Panel: Recent Labs  Lab 01/30/19 0823 02/02/19 1929 02/03/19 0454  NA 138 135 137  K 4.2 3.9 3.8  CL 105 104 105  CO2 22 18* 19*  GLUCOSE 232* 238* 220*  BUN 15 17 16   CREATININE 1.39* 1.24* 1.24*  CALCIUM 9.0 9.0 8.8*   GFR Estimated Creatinine Clearance: 40.1 mL/min (A) (by C-G formula based on SCr of 1.24 mg/dL (H)). Liver Function Tests: Recent Labs  Lab 01/30/19 0823 02/02/19 1929  AST 123* 130*  ALT 113* 92*  ALKPHOS 1,627* 1,819*  BILITOT 0.6 1.0  PROT 8.0 7.4  ALBUMIN 3.3* 3.2*   No results for input(s):  LIPASE, AMYLASE in the last 168 hours. No results for input(s): AMMONIA in the last 168 hours. Coagulation profile Recent Labs  Lab 02/02/19 1929  INR 1.6*    CBC: Recent Labs  Lab 01/30/19 0823 02/02/19 1929 02/03/19 0454  WBC 8.8 10.8* 9.8  NEUTROABS 5.4 7.8*  --   HGB 10.0* 9.6* 9.7*  HCT 32.9* 28.8* 28.8*  MCV 74.4* 67.4* 67.4*  PLT 304 374 376   Cardiac Enzymes: No results for input(s): CKTOTAL, CKMB, CKMBINDEX, TROPONINI in the last 168 hours. BNP (last 3 results) No results for input(s): PROBNP in the last 8760 hours. CBG: Recent Labs  Lab 01/27/19 1156 02/03/19 0402 02/03/19 0844  GLUCAP 92 226* 207*   D-Dimer: No results for  input(s): DDIMER in the last 72 hours. Hgb A1c: No results for input(s): HGBA1C in the last 72 hours. Lipid Profile: No results for input(s): CHOL, HDL, LDLCALC, TRIG, CHOLHDL, LDLDIRECT in the last 72 hours. Thyroid function studies: No results for input(s): TSH, T4TOTAL, T3FREE, THYROIDAB in the last 72 hours.  Invalid input(s): FREET3 Anemia work up: No results for input(s): VITAMINB12, FOLATE, FERRITIN, TIBC, IRON, RETICCTPCT in the last 72 hours. Sepsis Labs: Recent Labs  Lab 01/30/19 0823 02/02/19 1929 02/03/19 0454  WBC 8.8 10.8* 9.8    Microbiology Recent Results (from the past 240 hour(s))  SARS CORONAVIRUS 2 (TAT 6-24 HRS) Nasopharyngeal Nasopharyngeal Swab     Status: None   Collection Time: 02/02/19  9:30 PM   Specimen: Nasopharyngeal Swab  Result Value Ref Range Status   SARS Coronavirus 2 NEGATIVE NEGATIVE Final    Comment: (NOTE) SARS-CoV-2 target nucleic acids are NOT DETECTED. The SARS-CoV-2 RNA is generally detectable in upper and lower respiratory specimens during the acute phase of infection. Negative results do not preclude SARS-CoV-2 infection, do not rule out co-infections with other pathogens, and should not be used as the sole basis for treatment or other patient management decisions. Negative results must be combined with clinical observations, patient history, and epidemiological information. The expected result is Negative. Fact Sheet for Patients: SugarRoll.be Fact Sheet for Healthcare Providers: https://www.woods-mathews.com/ This test is not yet approved or cleared by the [place] FDA and  has been authorized for detection and/or diagnosis of SARS-CoV-2 by FDA under an Emergency Use Authorization (EUA). This EUA will remain  in effect (meaning this test can be used) for the duration of the COVID-19 declaration under Section 56 4(b)(1) of the Act, 21 U.S.C. section 360bbb-3(b)(1), unless the  authorization is terminated or revoked sooner. Performed at [facility], [place] [address], [place], [place] [address]     Procedures and diagnostic studies:  CT ABDOMEN PELVIS WO CONTRAST  Result Date: 02/02/2019 CLINICAL DATA:  Abdominal distension. Recent diagnosis of pulmonary embolus. Multiple liver lesions post biopsy. EXAM: CT ABDOMEN AND PELVIS WITHOUT CONTRAST TECHNIQUE: Multidetector CT imaging of the abdomen and pelvis was performed following the standard protocol without IV contrast. COMPARISON:  Chest CTA earlier this day. Abdominal CT 2 weeks ago 01/20/2019 FINDINGS: Lower chest: Emphysema scattered subpleural nodules, assessed on chest CT earlier this day. Hepatobiliary: Heterogeneous liver parenchyma. Multiple liver lesions are better defined on prior contrast-enhanced exam. Difficult to assess for change given differences in technique. Postcholecystectomy similar proximal biliary prominence to prior. Motion artifact limits detailed assessment. Pancreas: Motion obscured. Coarse parenchymal calcification in the pancreatic tail. Ductal dilatation in the tail measures up to 8 mm. Heterogeneous low-density in the pancreatic tail, series 2, image 16. Detailed parenchymal  assessment is limited in the lack of contrast and motion. Spleen: Normal in size without focal abnormality. Adrenals/Urinary Tract: Bilateral adrenal thickening. No hydronephrosis. There is excreted IV contrast in both renal collecting systems from prior chest CTA. Excreted IV contrast in the urinary bladder. No bladder wall thickening. Stomach/Bowel: Bowel evaluation is limited in the absence of contrast and patient motion. Also paucity of intra-abdominal fat. Stomach is nondistended. No small bowel obstruction or obvious inflammation. Normal appendix. Moderate stool burden throughout the colon. No colonic wall thickening or inflammation. Vascular/Lymphatic: Moderate aortic atherosclerosis. No aneurysm. Limited assessment  for adenopathy in the absence of contrast and patient motion. Reproductive: Post hysterectomy per history. No evidence of adnexal mass. Other: Small amount of perihepatic free fluid which has slightly increased in size from prior exam. Fluid is slightly dense and likely represents combination of simple ascites and post biopsy hemorrhage. Small amount of blood dependently in the pelvis. Cannot assess for active extravasation in the absence of IV contrast, however volume of fluid would not suggest this. Musculoskeletal: Postsurgical change of the anterior abdominal wall. A focal bone abnormality. IMPRESSION: 1. Post recent liver biopsy. Small amount of high-density fluid adjacent to the liver and tracking in the pelvis likely post biopsy hemorrhage. Cannot assess for active extravasation in the absence of IV contrast, however volume of fluid does not suggest this. Multiple liver lesions are better characterized on prior contrast-enhanced exam. 2. Coarse parenchymal calcification in the pancreatic tail with heterogeneous low-density and distal pancreatic ductal dilatation. In the setting of multiple hepatic lesions, findings are concerning for pancreatic neoplasm, however not well-defined on this noncontrast exam. Aortic Atherosclerosis (ICD10-I70.0). Electronically Signed   By: Keith Rake M.D.   On: 02/02/2019 23:20   CT Head Wo Contrast  Result Date: 02/02/2019 CLINICAL DATA:  Altered mental status EXAM: CT HEAD WITHOUT CONTRAST TECHNIQUE: Contiguous axial images were obtained from the base of the skull through the vertex without intravenous contrast. COMPARISON:  12/31/2018 FINDINGS: Brain: Chronic atrophic and ischemic changes are again seen and stable. No acute hemorrhage, acute infarction or space-occupying mass lesion are seen. Vascular: No hyperdense vessel or unexpected calcification. Skull: Normal. Negative for fracture or focal lesion. Sinuses/Orbits: No acute finding. Other: None. IMPRESSION:  Chronic atrophic and ischemic changes similar to that seen on the prior exam. No acute abnormality is noted. Electronically Signed   By: Inez Catalina M.D.   On: 02/02/2019 20:31   CT Angio Chest PE W/Cm &/Or Wo Cm  Result Date: 02/02/2019 CLINICAL DATA:  Shortness of breath EXAM: CT ANGIOGRAPHY CHEST WITH CONTRAST TECHNIQUE: Multidetector CT imaging of the chest was performed using the standard protocol during bolus administration of intravenous contrast. Multiplanar CT image reconstructions and MIPs were obtained to evaluate the vascular anatomy. CONTRAST:  45mL OMNIPAQUE IOHEXOL 350 MG/ML SOLN COMPARISON:  01/20/2019 FINDINGS: Cardiovascular: Thoracic aorta demonstrates atherosclerotic calcifications without aneurysmal dilatation or dissection. The heart is at the upper limits of normal in size. The pulmonary artery is well visualized with a normal branching pattern. The previously seen pulmonary emboli are again identified bilaterally with some minimal improvement in the small central branches. No significant resolution of the more peripheral emboli is noted. No new significant emboli are noted. Changes of right heart strain are again noted and stable. Mediastinum/Nodes: The esophagus as visualized is within normal limits. No hilar or mediastinal adenopathy is seen. The thoracic inlet is within normal limits. Lungs/Pleura: Diffuse emphysematous changes are noted throughout both lungs. Scattered subpleural nodular changes are  seen without significant increase from the prior study. No focal confluent infiltrate is seen. No sizable effusion is noted. No pneumothorax seen. Upper Abdomen: Visualized upper abdomen demonstrates mild perihepatic fluid stable from the prior exam. Stable hypodense lesions are noted throughout the liver consistent with the given clinical history of hepatic metastatic disease. Musculoskeletal: Degenerative changes of the thoracic spine are noted. No acute bony abnormality is seen.  Review of the MIP images confirms the above findings. IMPRESSION: Slight improvement in pulmonary emboli bilaterally although the peripheral emboli appear stable. No new emboli are seen. Persistent right heart strain. Emphysematous changes without acute infiltrate. Multiple pulmonary nodules stable from the prior exam which may represent some metastatic disease Hepatic metastatic disease. Aortic Atherosclerosis (ICD10-I70.0) and Emphysema (ICD10-J43.9). Electronically Signed   By: Inez Catalina M.D.   On: 02/02/2019 20:39   DG Chest Portable 1 View  Result Date: 02/02/2019 CLINICAL DATA:  Shortness of breath, stomach pain EXAM: PORTABLE CHEST 1 VIEW COMPARISON:  01/23/2019 FINDINGS: The heart size and mediastinal contours are within normal limits. Emphysema. The visualized skeletal structures are unremarkable. IMPRESSION: Emphysema without acute abnormality of the lungs in AP portable projection. Electronically Signed   By: Eddie Candle M.D.   On: 02/02/2019 19:06    Medications:   . insulin aspart  0-9 Units Subcutaneous Q4H  . methylPREDNISolone (SOLU-MEDROL) injection  40 mg Intravenous Q12H  . metoprolol tartrate  12.5 mg Oral BID  . sodium chloride flush  3 mL Intravenous Q12H   Continuous Infusions: . sodium chloride    . cefTRIAXone (ROCEPHIN)  IV 2 g (02/02/19 2252)  . heparin 1,000 Units/hr (02/02/19 2253)     LOS: 1 day   Reia Viernes  [facility]   *Please refer to [email or web address], password [email or web address] to get updated schedule on who will round on this patient, as hospitalists switch teams weekly. If 7PM-7AM, please contact night-coverage at [email or web address], password [email or web address] for any overnight needs.  02/03/2019, 10:54 AM

## 2019-02-03 NOTE — Consult Note (Signed)
[place] for Heparin Drip Indication: pulmonary embolus  No Known Allergies  Patient Measurements: Height: 5\' 6"  (167.6 cm) Weight: 132 lb (59.9 kg) IBW/kg (Calculated) : 59.3 Heparin Dosing Weight: 59.9kg  Vital Signs: Temp: 98.6 F (37 C) (12/14 1852) Temp Source: Oral (12/14 1852) BP: 124/73 (12/14 1852) Pulse Rate: 94 (12/14 1608)  Labs: Recent Labs    02/02/19 1929 02/02/19 2130 02/03/19 0454 02/03/19 1057  HGB 9.6*  --  9.7*  --   HCT 28.8*  --  28.8*  --   PLT 374  --  376  --   APTT 39*  --  100* 158*  LABPROT 18.6*  --   --   --   INR 1.6*  --   --   --   HEPARINUNFRC 2.46*  --  1.73*  --   CREATININE 1.24*  --  1.24*  --   TROPONINIHS 585* 506*  --   --     Estimated Creatinine Clearance: 40.1 mL/min (A) (by C-G formula based on SCr of 1.24 mg/dL (H)).   Medical History: Past Medical History:  Diagnosis Date  . Allergic rhinitis   . Aneurysm of unspecified site ([place])   . Cholelithiasis   . Diabetes type 2, controlled ([place])   . GERD (gastroesophageal reflux disease)   . Hypertension   . Leg DVT (deep venous thromboembolism), acute, bilateral ([place]) 01/21/2019  . Multiple subsegmental pulmonary emboli without acute cor pulmonale ([place]) 01/20/2019  . Neuropathy   . Veno-occlusive disease     Medications:  Unclear on last doses - pt was to take Lovenox 55mg  bid until liver biopsy was performed (12/7) and then transition back to oral Eliquis.   Pt and family members in ED this evening not able to give clear report on last dose of anticoagulant today/yesterday.  Assessment: 4 y old female with bilateral PE - pharmacy has been consulted to initiate/monitor heparin drip.  INR somewhat elevated @ 1.6 (likely due to liver dysfunction) and HGB 9.6.  12/14 @ 0454 aPTT therapeutic x 1, continue current rate and recheck in 6 hrs   Goal of Therapy:  Heparin level 0.3-0.7 units/ml Monitor platelets by anticoagulation  protocol: Yes   Plan:  12/14 1057 aPTT= 158.  Hgb 9.7  Plt 376 Supratherapeutic. Will hold Heparin drip x 30 minutes and restart at 900 units/hr.  Patient is to transition to Apixaban this evening 12/14. (5 mg bid- per discussion with MD, not treatment dose)  Will follow daily heparin level/CBC while on heparin drip  Update @ 2011: Nurse gave Eliquis @ 2002 and heparin was still running and timed to end at 2029 today. Informed Nurse to stop running heparin.   Rowland Lathe, PharmD Clinical Pharmacist 02/03/2019 8:11 PM

## 2019-02-03 NOTE — Consult Note (Signed)
ANTICOAGULATION CONSULT NOTE - Initial Consult  Pharmacy Consult for Heparin Drip Indication: pulmonary embolus  No Known Allergies  Patient Measurements: Height: 5\' 6"  (167.6 cm) Weight: 132 lb (59.9 kg) IBW/kg (Calculated) : 59.3 Heparin Dosing Weight: 59.9kg  Vital Signs: Temp: 98.1 F (36.7 C) (12/13 1856) Temp Source: Oral (12/13 1856) BP: 145/86 (12/14 0200) Pulse Rate: 88 (12/14 0200)  Labs: Recent Labs    02/02/19 1929 02/02/19 2130 02/03/19 0454  HGB 9.6*  --  9.7*  HCT 28.8*  --  28.8*  PLT 374  --  376  APTT 39*  --  100*  LABPROT 18.6*  --   --   INR 1.6*  --   --   HEPARINUNFRC 2.46*  --   --   CREATININE 1.24*  --  1.24*  TROPONINIHS 585* 506*  --     Estimated Creatinine Clearance: 40.1 mL/min (A) (by C-G formula based on SCr of 1.24 mg/dL (H)).   Medical History: Past Medical History:  Diagnosis Date  . Allergic rhinitis   . Aneurysm of unspecified site ([place])   . Cholelithiasis   . Diabetes type 2, controlled ([place])   . GERD (gastroesophageal reflux disease)   . Hypertension   . Leg DVT (deep venous thromboembolism), acute, bilateral ([place]) 01/21/2019  . Multiple subsegmental pulmonary emboli without acute cor pulmonale ([place]) 01/20/2019  . Neuropathy   . Veno-occlusive disease     Medications:  Unclear on last doses - pt was to take Lovenox 55mg  bid until liver biopsy was performed (12/7) and then transition back to oral Eliquis.   Pt and family members in ED this evening not able to give clear report on last dose of anticoagulant today/yesterday.  Assessment: 53 y old female with bilateral PE - pharmacy has been consulted to initiate/monitor heparin drip.  INR somewhat elevated @ 1.6 (likely due to liver dysfunction) and HGB 9.6.  12/14 @ 0454 aPTT therapeutic x 1, continue current rate and recheck in 6 hrs   Goal of Therapy:  Heparin level 0.3-0.7 units/ml Monitor platelets by anticoagulation protocol: Yes   Plan:  Unclear last  dose history of either Lovenox or Eliquis as outlined above.    Will obtain baseline Heparin Level and APTT per protocol.    Continue Heparin at 1000 units/hr.  Will check APTT in 6 hours per protocol, as I expect Heparin Level to be elevated.  Will follow daily heparin level/CBC while on heparin drip  Ena Dawley, PharmD Clinical Pharmacist 02/03/2019 6:44 AM

## 2019-02-03 NOTE — Telephone Encounter (Signed)
Left a message with the front office staff asking Dr. Alene Mires to call Dr. Rogue Bussing

## 2019-02-03 NOTE — Progress Notes (Signed)
*  PRELIMINARY RESULTS* Echocardiogram 2D Echocardiogram has been performed.  Audrey Chan 02/03/2019, 8:12 AM

## 2019-02-03 NOTE — ED Notes (Signed)
Report to PACU, patient to PACU 11.

## 2019-02-03 NOTE — ED Notes (Signed)
Patient is resting comfortably. 

## 2019-02-03 NOTE — Telephone Encounter (Signed)
12/14-I spoke to patient's sister-Audrey Chan regarding patient's clinical status.  Discussed the role of initiation of chemotherapy-to alleviate her symptoms.  Also discussed the significant risk of decompensation with chemo also.Discussed that I will discuss the treatment options tomorrow. Plan to meet sister by pt's bedside at 8 AM.

## 2019-02-03 NOTE — Consult Note (Signed)
[place]  [phone] [phone]   Name: Audrey Chan Date: 02/03/2019 MRN: [id number]  DOB: 1950-11-15  Patient Care Team: Theotis Burrow, MD as PCP - General (Family Medicine) Flora Lipps, MD (Pulmonary Disease) [name], MD as Consulting Physician (Oncology)    REASON FOR CONSULTATION: Audrey Chan is a 68 y.o. female with multiple medical problems including COPD, chronic respiratory failure on home O2 at 2 L, history of CVA without residual deficits, CKD 3, who was recently hospitalized 01/22/2019-01/25/2019 for acute DVT/PE and underwent thrombectomy.  She was incidentally found to have hepatic metastases and underwent outpatient biopsy on 01/27/2019.  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  She is readmitted on 02/02/2019 with acute on chronic hypoxic respiratory failure with suspected cor pulmonale due to recent PE.  Palliative care was consulted to help address goals.  SOCIAL HISTORY:     reports that she quit smoking about 19 years ago. She has never used smokeless tobacco. She reports that she does not drink alcohol or use drugs.   Patient is widowed.  She had a son who is now deceased.  She has a sister who is involved.  Patient lives at home with her sister.  ADVANCE DIRECTIVES:  Unknown  CODE STATUS: Full code  PAST MEDICAL HISTORY: Past Medical History:  Diagnosis Date   Allergic rhinitis    Aneurysm of unspecified site ([place])    Cholelithiasis    Diabetes type 2, controlled ([place])    GERD (gastroesophageal reflux disease)    Hypertension    Leg DVT (deep venous thromboembolism), acute, bilateral ([place]) 01/21/2019   Multiple subsegmental pulmonary emboli without acute cor pulmonale ([place]) 01/20/2019   Neuropathy    Veno-occlusive disease     PAST SURGICAL HISTORY:  Past Surgical History:  Procedure Laterality Date    ABDOMINAL HYSTERECTOMY  1972   CERVICAL FUSION     CHOLECYSTECTOMY N/A 12/28/2014   Procedure: LAPAROSCOPIC CHOLECYSTECTOMY;  Surgeon: Sherri Rad, MD;  Location: [facility] ORS;  Service: General;  Laterality: N/A;   EYE SURGERY     detached retina   MASS EXCISION Left 05/06/2018   Procedure: EXCISION OF LEFT ANTECUBITAL CYST, DIABETIC;  Surgeon: Vickie Epley, MD;  Location: [facility] OR[facility];  Service: Vascular;  Laterality: Left;   PULMONARY THROMBECTOMY Bilateral 01/22/2019   Procedure: PULMONARY THROMBECTOMY;  Surgeon: Algernon Huxley, MD;  Location: [place] [facility];  Service: Cardiovascular;  Laterality: Bilateral;    HEMATOLOGY/ONCOLOGY HISTORY:  Oncology History Overview Note  # DEC 2020- CT scan- MULTIPLE LIVER LESIONS s/p Bx- HIGH GRADE ADENO CA [not melanoma/or vascular tumor]; CEA- 396; CA 15-3-56/CA 27-29-150;CA-19-9-N;   # Bil PE/right heart strain [s/p thrombectomy; Dr.Dew/Dr.Aleskerov; no lysis- sec to recent stroke]; on eliquis  # COPD/  # NGS/MOLECULAR TESTS:p  # PALLIATIVE CARE EVALUATION:p  # PAIN MANAGEMENT: p   DIAGNOSIS:   STAGE:         ;  GOALS:  CURRENT/MOST RECENT THERAPY :     Liver metastases ([place])   Initial Diagnosis   Liver metastases ([place])     ALLERGIES:  has No Known Allergies.  MEDICATIONS:  Current Facility-Administered Medications  Medication Dose Route Frequency Provider Last Rate Last Admin   0.9 %  sodium chloride infusion  250 mL Intravenous PRN Athena Masse, MD       acetaminophen (TYLENOL) tablet 500-1,000 mg  500-1,000 mg Oral Q6H PRN Athena Masse,  MD       apixaban (ELIQUIS) tablet 5 mg  5 mg Oral BID Jennye Boroughs, MD       cefTRIAXone (ROCEPHIN) 2 g in sodium chloride 0.9 % 100 mL IVPB  2 g Intravenous Q24H Judd Gaudier V, MD 200 mL/hr at 02/02/19 2252 2 g at 02/02/19 2252   Chlorhexidine Gluconate Cloth 2 % PADS 6 each  6 each Topical Daily Jennye Boroughs, MD       heparin ADULT infusion 100 units/mL (25000  units/233mL sodium chloride 0.45%)  900 Units/hr Intravenous Continuous Jennye Boroughs, MD 9 mL/hr at 02/03/19 1251 900 Units/hr at 02/03/19 1251   insulin aspart (novoLOG) 100 UNIT/ML injection            insulin aspart (novoLOG) injection 0-9 Units  0-9 Units Subcutaneous Q4H Athena Masse, MD   9 Units at 02/03/19 1733   ipratropium-albuterol (DUONEB) 0.5-2.5 (3) MG/3ML nebulizer solution 3 mL  3 mL Nebulization Q6H PRN Athena Masse, MD       methylPREDNISolone sodium succinate (SOLU-MEDROL) 40 mg/mL injection 40 mg  40 mg Intravenous Q12H Judd Gaudier V, MD   40 mg at 02/03/19 1116   metoprolol tartrate (LOPRESSOR) tablet 12.5 mg  12.5 mg Oral BID Judd Gaudier V, MD   12.5 mg at 02/03/19 1113   sodium chloride flush (NS) 0.9 % injection 3 mL  3 mL Intravenous Q12H Judd Gaudier V, MD   3 mL at 02/03/19 1116   sodium chloride flush (NS) 0.9 % injection 3 mL  3 mL Intravenous PRN Athena Masse, MD        VITAL SIGNS: BP 124/73 (BP Location: Right Arm)    Pulse 94    Temp 98.6 F (37 C) (Oral)    Resp 20    Ht 5\' 6"  (1.676 m)    Wt 132 lb (59.9 kg)    SpO2 93%    BMI 21.31 kg/m  Filed Weights   02/02/19 1900  Weight: 132 lb (59.9 kg)    Estimated body mass index is 21.31 kg/m as calculated from the following:   Height as of this encounter: 5\' 6"  (1.676 m).   Weight as of this encounter: 132 lb (59.9 kg).  LABS: CBC:    Component Value Date/Time   WBC 9.8 02/03/2019 0454   HGB 9.7 (L) 02/03/2019 0454   HCT 28.8 (L) 02/03/2019 0454   PLT 376 02/03/2019 0454   MCV 67.4 (L) 02/03/2019 0454   NEUTROABS 7.8 (H) 02/02/2019 1929   LYMPHSABS 1.7 02/02/2019 1929   MONOABS 1.0 02/02/2019 1929   EOSABS 0.1 02/02/2019 1929   BASOSABS 0.1 02/02/2019 1929   Comprehensive Metabolic Panel:    Component Value Date/Time   NA 137 02/03/2019 0454   K 3.8 02/03/2019 0454   CL 105 02/03/2019 0454   CO2 19 (L) 02/03/2019 0454   BUN 16 02/03/2019 0454   CREATININE 1.24 (H)  02/03/2019 0454   GLUCOSE 220 (H) 02/03/2019 0454   CALCIUM 8.8 (L) 02/03/2019 0454   AST 130 (H) 02/02/2019 1929   ALT 92 (H) 02/02/2019 1929   ALKPHOS 1,819 (H) 02/02/2019 1929   BILITOT 1.0 02/02/2019 1929   PROT 7.4 02/02/2019 1929   ALBUMIN 3.2 (L) 02/02/2019 1929    RADIOGRAPHIC STUDIES: CT ABDOMEN PELVIS WO CONTRAST  Result Date: 02/02/2019 CLINICAL DATA:  Abdominal distension. Recent diagnosis of pulmonary embolus. Multiple liver lesions post biopsy. EXAM: CT ABDOMEN AND PELVIS WITHOUT CONTRAST TECHNIQUE: Multidetector  CT imaging of the abdomen and pelvis was performed following the standard protocol without IV contrast. COMPARISON:  Chest CTA earlier this day. Abdominal CT 2 weeks ago 01/20/2019 FINDINGS: Lower chest: Emphysema scattered subpleural nodules, assessed on chest CT earlier this day. Hepatobiliary: Heterogeneous liver parenchyma. Multiple liver lesions are better defined on prior contrast-enhanced exam. Difficult to assess for change given differences in technique. Postcholecystectomy similar proximal biliary prominence to prior. Motion artifact limits detailed assessment. Pancreas: Motion obscured. Coarse parenchymal calcification in the pancreatic tail. Ductal dilatation in the tail measures up to 8 mm. Heterogeneous low-density in the pancreatic tail, series 2, image 16. Detailed parenchymal assessment is limited in the lack of contrast and motion. Spleen: Normal in size without focal abnormality. Adrenals/Urinary Tract: Bilateral adrenal thickening. No hydronephrosis. There is excreted IV contrast in both renal collecting systems from prior chest CTA. Excreted IV contrast in the urinary bladder. No bladder wall thickening. Stomach/Bowel: Bowel evaluation is limited in the absence of contrast and patient motion. Also paucity of intra-abdominal fat. Stomach is nondistended. No small bowel obstruction or obvious inflammation. Normal appendix. Moderate stool burden throughout the  colon. No colonic wall thickening or inflammation. Vascular/Lymphatic: Moderate aortic atherosclerosis. No aneurysm. Limited assessment for adenopathy in the absence of contrast and patient motion. Reproductive: Post hysterectomy per history. No evidence of adnexal mass. Other: Small amount of perihepatic free fluid which has slightly increased in size from prior exam. Fluid is slightly dense and likely represents combination of simple ascites and post biopsy hemorrhage. Small amount of blood dependently in the pelvis. Cannot assess for active extravasation in the absence of IV contrast, however volume of fluid would not suggest this. Musculoskeletal: Postsurgical change of the anterior abdominal wall. A focal bone abnormality. IMPRESSION: 1. Post recent liver biopsy. Small amount of high-density fluid adjacent to the liver and tracking in the pelvis likely post biopsy hemorrhage. Cannot assess for active extravasation in the absence of IV contrast, however volume of fluid does not suggest this. Multiple liver lesions are better characterized on prior contrast-enhanced exam. 2. Coarse parenchymal calcification in the pancreatic tail with heterogeneous low-density and distal pancreatic ductal dilatation. In the setting of multiple hepatic lesions, findings are concerning for pancreatic neoplasm, however not well-defined on this noncontrast exam. Aortic Atherosclerosis (ICD10-I70.0). Electronically Signed   By: Keith Rake M.D.   On: 02/02/2019 23:20   CT Head Wo Contrast  Result Date: 02/02/2019 CLINICAL DATA:  Altered mental status EXAM: CT HEAD WITHOUT CONTRAST TECHNIQUE: Contiguous axial images were obtained from the base of the skull through the vertex without intravenous contrast. COMPARISON:  12/31/2018 FINDINGS: Brain: Chronic atrophic and ischemic changes are again seen and stable. No acute hemorrhage, acute infarction or space-occupying mass lesion are seen. Vascular: No hyperdense vessel or  unexpected calcification. Skull: Normal. Negative for fracture or focal lesion. Sinuses/Orbits: No acute finding. Other: None. IMPRESSION: Chronic atrophic and ischemic changes similar to that seen on the prior exam. No acute abnormality is noted. Electronically Signed   By: Inez Catalina M.D.   On: 02/02/2019 20:31   CT Angio Chest PE W/Cm &/Or Wo Cm  Result Date: 02/02/2019 CLINICAL DATA:  Shortness of breath EXAM: CT ANGIOGRAPHY CHEST WITH CONTRAST TECHNIQUE: Multidetector CT imaging of the chest was performed using the standard protocol during bolus administration of intravenous contrast. Multiplanar CT image reconstructions and MIPs were obtained to evaluate the vascular anatomy. CONTRAST:  51mL OMNIPAQUE IOHEXOL 350 MG/ML SOLN COMPARISON:  01/20/2019 FINDINGS: Cardiovascular: Thoracic aorta demonstrates  atherosclerotic calcifications without aneurysmal dilatation or dissection. The heart is at the upper limits of normal in size. The pulmonary artery is well visualized with a normal branching pattern. The previously seen pulmonary emboli are again identified bilaterally with some minimal improvement in the small central branches. No significant resolution of the more peripheral emboli is noted. No new significant emboli are noted. Changes of right heart strain are again noted and stable. Mediastinum/Nodes: The esophagus as visualized is within normal limits. No hilar or mediastinal adenopathy is seen. The thoracic inlet is within normal limits. Lungs/Pleura: Diffuse emphysematous changes are noted throughout both lungs. Scattered subpleural nodular changes are seen without significant increase from the prior study. No focal confluent infiltrate is seen. No sizable effusion is noted. No pneumothorax seen. Upper Abdomen: Visualized upper abdomen demonstrates mild perihepatic fluid stable from the prior exam. Stable hypodense lesions are noted throughout the liver consistent with the given clinical history of  hepatic metastatic disease. Musculoskeletal: Degenerative changes of the thoracic spine are noted. No acute bony abnormality is seen. Review of the MIP images confirms the above findings. IMPRESSION: Slight improvement in pulmonary emboli bilaterally although the peripheral emboli appear stable. No new emboli are seen. Persistent right heart strain. Emphysematous changes without acute infiltrate. Multiple pulmonary nodules stable from the prior exam which may represent some metastatic disease Hepatic metastatic disease. Aortic Atherosclerosis (ICD10-I70.0) and Emphysema (ICD10-J43.9). Electronically Signed   By: Inez Catalina M.D.   On: 02/02/2019 20:39   CT Angio Chest PE W/Cm &/Or Wo Cm  Result Date: 01/20/2019 CLINICAL DATA:  Suspected covid, elevated LFTs, recent stroke EXAM: CT ANGIOGRAPHY CHEST, abdomen, and pelvis WITH CONTRAST TECHNIQUE: Multidetector CT imaging of the chest, abdomen, and pelvis was performed using the standard protocol during bolus administration of intravenous contrast. Multiplanar CT image reconstructions and MIPs were obtained to evaluate the vascular anatomy. CONTRAST:  47mL OMNIPAQUE IOHEXOL 350 MG/ML SOLN COMPARISON:  CT chest September 13, 2017 FINDINGS: Cardiovascular: There is a optimal opacification of the pulmonary arteries. There is a small nonocclusive thrombus seen within the left main pulmonary artery which extends into the anterior left upper lobe segmental branches and partially occlusive thrombus in the posterior left lower lobe subsegmental branches. There is also partially occlusive thrombus seen within the posterior right upper lobe segmental branch, series 6, image 37. There is also partially occlusive thrombus seen extending into the right middle lobe subsegmental branches and posterior right lower lobe subsegmental branches. The heart is normal in size. No pericardial effusion or thickening. There is slight straightening of the interventricular septum which could be  due to early right ventricular heart strain. There is normal three-vessel brachiocephalic anatomy without proximal stenosis. Scattered atherosclerosis is noted. The thoracic aorta is normal in appearance. Mediastinum/Nodes: No hilar, mediastinal, or axillary adenopathy. Thyroid gland, trachea, and esophagus demonstrate no significant findings. Lungs/Pleura: Small subcentimeter pulmonary nodule are again identified as on prior exam dating back to 2019, for example in the posterior right upper lobe there is a 5 mm pulmonary nodule, series 8, image 21 and anterior right lower lobe, series 8, image 54 a 4 mm pulmonary nodule. There is however a new small nodular opacity in the posterior left upper lobe, series 8, image 47 measuring 7 mm not seen on prior exam. No large airspace consolidation or pleural effusion is seen. Centrilobular emphysematous changes seen within both lung apices. Musculoskeletal: No chest wall abnormality. No acute or significant osseous findings. Review of the MIP images confirms the above findings.  Abdomen/pelvis: Hepatobiliary: There are multiple low-density liver lesions seen throughout which were not seen on the prior exam. There is also heterogeneous enhancement seen throughout the liver parenchyma. Central periportal edema is noted. The portal vein is patent. The patient is status post cholecystectomy. A small amount of perihepatic ascites is noted. Pancreas: Unremarkable. No pancreatic ductal dilatation or surrounding inflammatory changes. Spleen: Normal in size without focal abnormality. Adrenals/Urinary Tract: Both adrenal glands appear normal. The kidneys and collecting system appear normal without evidence of urinary tract calculus or hydronephrosis. Bladder is unremarkable. Stomach/Bowel: The stomach, small bowel, and colon are normal in appearance. No inflammatory changes, wall thickening, or obstructive findings. Vascular/Lymphatic: There are no enlarged mesenteric, retroperitoneal,  or pelvic lymph nodes. Scattered aortic atherosclerotic calcifications are seen without aneurysmal dilatation. Reproductive: The uterus and adnexa are unremarkable. Other: No evidence of abdominal wall mass or hernia. Musculoskeletal: No acute or significant osseous findings. IMPRESSION: 1. Bilateral partially occlusive segmental and subsegmental pulmonary emboli as described above. 2. Findings which could be suggestive of early right ventricular heart strain. 3. Multiple unchanged subcentimeter pulmonary nodules as on prior exam dating back to 2019. 4. New nodular opacity within the posterior left upper lobe measuring 7 mm which is nonspecific, and would recommend attention on short interval follow-up exam. 5. Centrilobular emphysematous changes. 6. Numerous hepatic lesions throughout, likely suggestive of hepatic metastatic disease. 7. Small amount of perihepatic ascites 8. These results were called by telephone at the time of interpretation on 01/20/2019 at 2:07 am to provider [facility] , who verbally acknowledged these results. Electronically Signed   By: Prudencio Pair M.D.   On: 01/20/2019 02:09   CT abd/pelvis w/ IV contrast  Result Date: 01/20/2019 CLINICAL DATA:  Suspected covid, elevated LFTs, recent stroke EXAM: CT ANGIOGRAPHY CHEST, abdomen, and pelvis WITH CONTRAST TECHNIQUE: Multidetector CT imaging of the chest, abdomen, and pelvis was performed using the standard protocol during bolus administration of intravenous contrast. Multiplanar CT image reconstructions and MIPs were obtained to evaluate the vascular anatomy. CONTRAST:  25mL OMNIPAQUE IOHEXOL 350 MG/ML SOLN COMPARISON:  CT chest September 13, 2017 FINDINGS: Cardiovascular: There is a optimal opacification of the pulmonary arteries. There is a small nonocclusive thrombus seen within the left main pulmonary artery which extends into the anterior left upper lobe segmental branches and partially occlusive thrombus in the posterior left lower lobe  subsegmental branches. There is also partially occlusive thrombus seen within the posterior right upper lobe segmental branch, series 6, image 37. There is also partially occlusive thrombus seen extending into the right middle lobe subsegmental branches and posterior right lower lobe subsegmental branches. The heart is normal in size. No pericardial effusion or thickening. There is slight straightening of the interventricular septum which could be due to early right ventricular heart strain. There is normal three-vessel brachiocephalic anatomy without proximal stenosis. Scattered atherosclerosis is noted. The thoracic aorta is normal in appearance. Mediastinum/Nodes: No hilar, mediastinal, or axillary adenopathy. Thyroid gland, trachea, and esophagus demonstrate no significant findings. Lungs/Pleura: Small subcentimeter pulmonary nodule are again identified as on prior exam dating back to 2019, for example in the posterior right upper lobe there is a 5 mm pulmonary nodule, series 8, image 21 and anterior right lower lobe, series 8, image 54 a 4 mm pulmonary nodule. There is however a new small nodular opacity in the posterior left upper lobe, series 8, image 47 measuring 7 mm not seen on prior exam. No large airspace consolidation or pleural effusion is seen. Centrilobular  emphysematous changes seen within both lung apices. Musculoskeletal: No chest wall abnormality. No acute or significant osseous findings. Review of the MIP images confirms the above findings. Abdomen/pelvis: Hepatobiliary: There are multiple low-density liver lesions seen throughout which were not seen on the prior exam. There is also heterogeneous enhancement seen throughout the liver parenchyma. Central periportal edema is noted. The portal vein is patent. The patient is status post cholecystectomy. A small amount of perihepatic ascites is noted. Pancreas: Unremarkable. No pancreatic ductal dilatation or surrounding inflammatory changes. Spleen:  Normal in size without focal abnormality. Adrenals/Urinary Tract: Both adrenal glands appear normal. The kidneys and collecting system appear normal without evidence of urinary tract calculus or hydronephrosis. Bladder is unremarkable. Stomach/Bowel: The stomach, small bowel, and colon are normal in appearance. No inflammatory changes, wall thickening, or obstructive findings. Vascular/Lymphatic: There are no enlarged mesenteric, retroperitoneal, or pelvic lymph nodes. Scattered aortic atherosclerotic calcifications are seen without aneurysmal dilatation. Reproductive: The uterus and adnexa are unremarkable. Other: No evidence of abdominal wall mass or hernia. Musculoskeletal: No acute or significant osseous findings. IMPRESSION: 1. Bilateral partially occlusive segmental and subsegmental pulmonary emboli as described above. 2. Findings which could be suggestive of early right ventricular heart strain. 3. Multiple unchanged subcentimeter pulmonary nodules as on prior exam dating back to 2019. 4. New nodular opacity within the posterior left upper lobe measuring 7 mm which is nonspecific, and would recommend attention on short interval follow-up exam. 5. Centrilobular emphysematous changes. 6. Numerous hepatic lesions throughout, likely suggestive of hepatic metastatic disease. 7. Small amount of perihepatic ascites 8. These results were called by telephone at the time of interpretation on 01/20/2019 at 2:07 am to provider [facility] , who verbally acknowledged these results. Electronically Signed   By: Prudencio Pair M.D.   On: 01/20/2019 02:09   PERIPHERAL VASCULAR CATHETERIZATION  Result Date: 01/22/2019 See op note  US Venous Img Lower Bilateral (DVT)  Result Date: 01/20/2019 CLINICAL DATA:  History of pulmonary embolism, now with lower extremity pain. Evaluate for DVT. EXAM: BILATERAL LOWER EXTREMITY VENOUS DOPPLER ULTRASOUND TECHNIQUE: Gray-scale sonography with graded compression, as well as color  Doppler and duplex ultrasound were performed to evaluate the lower extremity deep venous systems from the level of the common femoral vein and including the common femoral, femoral, profunda femoral, popliteal and calf veins including the posterior tibial, peroneal and gastrocnemius veins when visible. The superficial great saphenous vein was also interrogated. Spectral Doppler was utilized to evaluate flow at rest and with distal augmentation maneuvers in the common femoral, femoral and popliteal veins. COMPARISON:  Chest CTA-01/20/2019 FINDINGS: RIGHT LOWER EXTREMITY Common Femoral Vein: No evidence of thrombus. Normal compressibility, respiratory phasicity and response to augmentation. Saphenofemoral Junction: No evidence of thrombus. Normal compressibility and flow on color Doppler imaging. Profunda Femoral Vein: No evidence of thrombus. Normal compressibility and flow on color Doppler imaging. Femoral Vein: No evidence of thrombus. Normal compressibility, respiratory phasicity and response to augmentation. Popliteal Vein: No evidence of thrombus. Normal compressibility, respiratory phasicity and response to augmentation. Calf Veins: There is hypoechoic occlusive thrombus within both paired right peroneal veins (images 58 and 59). Both paired right posterior tibial veins appear patent where imaged. Superficial Great Saphenous Vein: No evidence of thrombus. Normal compressibility. Other Findings:  None. _________________________________________________________ LEFT LOWER EXTREMITY Common Femoral Vein: No evidence of thrombus. Normal compressibility, respiratory phasicity and response to augmentation. Saphenofemoral Junction: No evidence of thrombus. Normal compressibility and flow on color Doppler imaging. Profunda Femoral Vein: No evidence of  thrombus. Normal compressibility and flow on color Doppler imaging. Femoral Vein: No evidence of thrombus. Normal compressibility, respiratory phasicity and response to  augmentation. Popliteal Vein: No evidence of thrombus. Normal compressibility, respiratory phasicity and response to augmentation. Calf Veins: There is hypoechoic occlusive thrombus within 1 of the paired left peroneal veins (images 29 and 30). The adjacent paired left peroneal vein as well though both paired posterior tibial veins appear patent. Superficial Great Saphenous Vein: No evidence of thrombus. Normal compressibility. Other Findings:  None. IMPRESSION: Examination is positive for occlusive DVT involving both paired right peroneal veins as well as one of two paired left peroneal veins. There is no extension of this distal tibial DVT to the more proximal venous system of either lower extremity. Electronically Signed   By: [name] Mariscal M.D.   On: 01/20/2019 17:02   CT BIOPSY  Result Date: 01/27/2019 INDICATION: Multiple liver lesions. EXAM: CT directed liver biopsy. MEDICATIONS: None. ANESTHESIA/SEDATION: Moderate (conscious) sedation was employed during this procedure. A total of Versed 2 mg and Fentanyl 25 mcg was administered intravenously. Moderate Sedation Time: 14 minutes. The patient's level of consciousness and vital signs were monitored continuously by radiology nursing throughout the procedure under my direct supervision. FLUOROSCOPY TIME:  Fluoroscopy Time: 0 minutes 0 seconds (0 mGy). COMPLICATIONS: None immediate. PROCEDURE: After discussing the risks and benefits of this procedure the patient informed consent was obtained. Standard time-out procedure was performed. Patient was sterilely prepped and draped. Following local anesthesia with 1% lidocaine and IV conscious sedation CT-directed liver lesion core biopsy was obtained with 2 good 23 mm 18 gauge core biopsy samples obtained. Sample sent to pathology. There no complications. IMPRESSION: Successful liver lesion CT-directed biopsy. Electronically Signed   By: Marcello Moores  Register   On: 01/27/2019 11:47   DG Chest Portable 1 View  Result  Date: 02/02/2019 CLINICAL DATA:  Shortness of breath, stomach pain EXAM: PORTABLE CHEST 1 VIEW COMPARISON:  01/23/2019 FINDINGS: The heart size and mediastinal contours are within normal limits. Emphysema. The visualized skeletal structures are unremarkable. IMPRESSION: Emphysema without acute abnormality of the lungs in AP portable projection. Electronically Signed   By: Eddie Candle M.D.   On: 02/02/2019 19:06   DG Chest Port 1 View  Result Date: 01/23/2019 CLINICAL DATA:  Hypertension, pulmonary emboli EXAM: PORTABLE CHEST 1 VIEW COMPARISON:  01/20/2019, 01/19/2019 FINDINGS: The heart size and mediastinal contours are stable. No new focal airspace consolidation, pleural effusion, or pneumothorax. Lungs are mildly hyperexpanded. The visualized skeletal structures are unremarkable. IMPRESSION: Stable appearance of the chest without new airspace opacity. Electronically Signed   By: Davina Poke M.D.   On: 01/23/2019 12:54   DG Chest Portable 1 View  Result Date: 01/19/2019 CLINICAL DATA:  Shortness of breath EXAM: PORTABLE CHEST 1 VIEW COMPARISON:  12/31/2018 FINDINGS: Cardiomegaly. Lungs clear. No effusions or edema. No acute bony abnormality. IMPRESSION: Cardiomegaly.  No active disease. Electronically Signed   By: Rolm Baptise M.D.   On: 01/19/2019 23:33   ECHOCARDIOGRAM COMPLETE  Result Date: 02/03/2019   ECHOCARDIOGRAM REPORT   Patient Name:   SHAQUASHA KWOK Date of Exam: 02/03/2019 Medical Rec #:  [id number]         Height:       66.0 in Accession #:    [id number]        Weight:       132.0 lb Date of Birth:  August 12, 1950         BSA:  1.68 m Patient Age:    60 years          BP:           145/86 mmHg Patient Gender: F                 HR:           95 bpm. Exam Location:  [facility] Procedure: 2D Echo, Cardiac Doppler and Color Doppler Indications:     I50.31 CHF-Acute Diastolic  History:         Patient has prior history of Echocardiogram examinations, most                  recent  01/20/2019. Risk Factors:Hypertension and Diabetes.  Sonographer:     Charmayne Sheer RDCS (AE) Referring Phys:  [id number] Athena Masse Diagnosing Phys: Kathlyn Sacramento MD  Sonographer Comments: Suboptimal subcostal window. IMPRESSIONS  1. Left ventricular ejection fraction, by visual estimation, is 55 to 60%. The left ventricle has normal function. There is mildly increased left ventricular hypertrophy.  2. Left ventricular diastolic parameters are consistent with Grade I diastolic dysfunction (impaired relaxation).  3. Right ventricular pressure overload.  4. The left ventricle has no regional wall motion abnormalities.  5. Global right ventricle has mildly reduced systolic function.The right ventricular size is moderately enlarged. No increase in right ventricular wall thickness.  6. Left atrial size was normal.  7. Right atrial size was moderately dilated.  8. The mitral valve is normal in structure. No evidence of mitral valve regurgitation. No evidence of mitral stenosis.  9. The tricuspid valve is normal in structure. Tricuspid valve regurgitation moderate. 10. The aortic valve is normal in structure. Aortic valve regurgitation is not visualized. Mild to moderate aortic valve sclerosis/calcification without any evidence of aortic stenosis. 11. The pulmonic valve was normal in structure. Pulmonic valve regurgitation is not visualized. 12. Moderately to severely elevated pulmonary artery systolic pressure. 13. The tricuspid regurgitant velocity is 4.02 m/s, and with an assumed right atrial pressure of 5 mmHg, the estimated right ventricular systolic pressure is moderately elevated at 69.6 mmHg. FINDINGS  Left Ventricle: Left ventricular ejection fraction, by visual estimation, is 55 to 60%. The left ventricle has normal function. The left ventricle has no regional wall motion abnormalities. There is mildly increased left ventricular hypertrophy. The interventricular septum is flattened in systole, consistent with  right ventricular pressure overload. Left ventricular diastolic parameters are consistent with Grade I diastolic dysfunction (impaired relaxation). Normal left atrial pressure. Right Ventricle: The right ventricular size is moderately enlarged. No increase in right ventricular wall thickness. Global RV systolic function is has mildly reduced systolic function. The tricuspid regurgitant velocity is 4.02 m/s, and with an assumed right atrial pressure of 5 mmHg, the estimated right ventricular systolic pressure is moderately elevated at 69.6 mmHg. Left Atrium: Left atrial size was normal in size. Right Atrium: Right atrial size was moderately dilated Pericardium: There is no evidence of pericardial effusion. Mitral Valve: The mitral valve is normal in structure. No evidence of mitral valve regurgitation. No evidence of mitral valve stenosis by observation. MV peak gradient, 4.4 mmHg. Tricuspid Valve: The tricuspid valve is normal in structure. Tricuspid valve regurgitation moderate. Aortic Valve: The aortic valve is normal in structure. Aortic valve regurgitation is not visualized. Mild to moderate aortic valve sclerosis/calcification is present, without any evidence of aortic stenosis. Aortic valve mean gradient measures 4.0 mmHg. Aortic valve peak gradient measures 6.9 mmHg. Aortic valve area, by VTI measures  2.09 cm. Pulmonic Valve: The pulmonic valve was normal in structure. Pulmonic valve regurgitation is not visualized. Pulmonic regurgitation is not visualized. Aorta: The aortic root, ascending aorta and aortic arch are all structurally normal, with no evidence of dilitation or obstruction. IAS/Shunts: No atrial level shunt detected by color flow Doppler. There is no evidence of a patent foramen ovale. No ventricular septal defect is seen or detected. There is no evidence of an atrial septal defect.  LEFT VENTRICLE PLAX 2D LVIDd:         3.70 cm  Diastology LVIDs:         2.41 cm  LV e' lateral:   6.64 cm/s LV  PW:         0.78 cm  LV E/e' lateral: 7.4 LV IVS:        0.71 cm LVOT diam:     1.90 cm LV SV:         38 ml LV SV Index:   22.62 LVOT Area:     2.84 cm  RIGHT VENTRICLE RV Basal diam:  2.91 cm LEFT ATRIUM             Index       RIGHT ATRIUM           Index LA diam:        2.80 cm 1.67 cm/m  RA Area:     23.20 cm LA Vol (A2C):   31.5 ml 18.79 ml/m RA Volume:   86.80 ml  51.79 ml/m LA Vol (A4C):   27.0 ml 16.11 ml/m LA Biplane Vol: 29.8 ml 17.78 ml/m  AORTIC VALVE                   PULMONIC VALVE AV Area (Vmax):    1.97 cm    PV Vmax:       0.75 m/s AV Area (Vmean):   1.94 cm    PV Vmean:      54.900 cm/s AV Area (VTI):     2.09 cm    PV VTI:        0.146 m AV Vmax:           131.00 cm/s PV Peak grad:  2.3 mmHg AV Vmean:          86.200 cm/s PV Mean grad:  1.0 mmHg AV VTI:            0.209 m AV Peak Grad:      6.9 mmHg AV Mean Grad:      4.0 mmHg LVOT Vmax:         90.90 cm/s LVOT Vmean:        59.100 cm/s LVOT VTI:          0.154 m LVOT/AV VTI ratio: 0.74  AORTA Ao Root diam: 2.70 cm MITRAL VALVE                        TRICUSPID VALVE MV Area (PHT): 3.91 cm             TR Peak grad:   64.6 mmHg MV Peak grad:  4.4 mmHg             TR Vmax:        421.00 cm/s MV Mean grad:  2.0 mmHg MV Vmax:       1.05 m/s             SHUNTS MV Vmean:  64.9 cm/s            Systemic VTI:  0.15 m MV VTI:        0.14 m               Systemic Diam: 1.90 cm MV PHT:        56.26 msec MV Decel Time: 194 msec MV E velocity: 49.00 cm/s 103 cm/s MV A velocity: 97.00 cm/s 70.3 cm/s MV E/A ratio:  0.51       1.5  Kathlyn Sacramento MD Electronically signed by Kathlyn Sacramento MD Signature Date/Time: 02/03/2019/1:36:07 PM    Final    ECHOCARDIOGRAM COMPLETE  Result Date: 01/20/2019   ECHOCARDIOGRAM REPORT   Patient Name:   GAYATRI GRIMSRUD Date of Exam: 01/20/2019 Medical Rec #:  [id number]         Height:       66.0 in Accession #:    [id number]        Weight:       122.0 lb Date of Birth:  1950-12-20         BSA:          1.62 m  Patient Age:    67 years          BP:           116/71 mmHg Patient Gender: F                 HR:           91 bpm. Exam Location:  [facility] Procedure: 2D Echo, Cardiac Doppler and Color Doppler Indications:     Abnormal ECG 794.31  History:         Patient has no prior history of Echocardiogram examinations.                  Risk Factors:Hypertension and Diabetes.  Sonographer:     Sherrie Sport RDCS (AE) Referring Phys:  [id number] Victoriano Lain A THOMAS Diagnosing Phys: Serafina Royals MD IMPRESSIONS  1. Left ventricular ejection fraction, by visual estimation, is 55 to 60%. The left ventricle has normal function. There is no left ventricular hypertrophy.  2. Global right ventricle has moderately reduced systolic function.The right ventricular size is moderately enlarged. Mildly increased right ventricular wall thickness.  3. Left atrial size was normal.  4. Right atrial size was moderately dilated.  5. The mitral valve is normal in structure. Trace mitral valve regurgitation.  6. The tricuspid valve is normal in structure. Tricuspid valve regurgitation moderate.  7. The aortic valve is normal in structure. Aortic valve regurgitation is not visualized.  8. The pulmonic valve was grossly normal. Pulmonic valve regurgitation is trivial.  9. Moderately elevated pulmonary artery systolic pressure. 10. The tricuspid regurgitant velocity is 3.62 m/s, and with an assumed right atrial pressure of 10 mmHg, the estimated right ventricular systolic pressure is moderately elevated at 62.6 mmHg. FINDINGS  Left Ventricle: Left ventricular ejection fraction, by visual estimation, is 55 to 60%. The left ventricle has normal function. There is no left ventricular hypertrophy. Right Ventricle: The right ventricular size is moderately enlarged. Mildly increased right ventricular wall thickness. Global RV systolic function is has moderately reduced systolic function. The tricuspid regurgitant velocity is 3.62 m/s, and with an assumed right atrial  pressure of 10 mmHg, the estimated right ventricular systolic pressure is moderately elevated at 62.6 mmHg. Left Atrium: Left atrial size was normal in size. Right Atrium: Right atrial size was moderately dilated Pericardium:  There is no evidence of pericardial effusion. Mitral Valve: The mitral valve is normal in structure. Trace mitral valve regurgitation. Tricuspid Valve: The tricuspid valve is normal in structure. Tricuspid valve regurgitation moderate. Aortic Valve: The aortic valve is normal in structure. Aortic valve regurgitation is not visualized. Aortic valve mean gradient measures 3.7 mmHg. Aortic valve peak gradient measures 5.2 mmHg. Aortic valve area, by VTI measures 1.65 cm. Pulmonic Valve: The pulmonic valve was grossly normal. Pulmonic valve regurgitation is trivial. Aorta: The aortic root and ascending aorta are structurally normal, with no evidence of dilitation. IAS/Shunts: No atrial level shunt detected by color flow Doppler.  LEFT VENTRICLE PLAX 2D LVIDd:         3.16 cm  Diastology LVIDs:         1.90 cm  LV e' lateral:   5.77 cm/s LV PW:         1.41 cm  LV E/e' lateral: 9.3 LV IVS:        1.11 cm  LV e' medial:    3.92 cm/s LVOT diam:     2.00 cm  LV E/e' medial:  13.6 LV SV:         29 ml LV SV Index:   17.85 LVOT Area:     3.14 cm  RIGHT VENTRICLE RV Basal diam:  4.44 cm RV S prime:     10.20 cm/s TAPSE (M-mode): 3.0 cm LEFT ATRIUM             Index       RIGHT ATRIUM           Index LA diam:        2.40 cm 1.48 cm/m  RA Area:     25.70 cm LA Vol (A2C):   21.1 ml 13.02 ml/m RA Volume:   96.00 ml  59.23 ml/m LA Vol (A4C):   9.6 ml  5.94 ml/m LA Biplane Vol: 14.6 ml 9.01 ml/m  AORTIC VALVE                   PULMONIC VALVE AV Area (Vmax):    1.61 cm    PV Vmax:        0.42 m/s AV Area (Vmean):   1.45 cm    PV Peak grad:   0.7 mmHg AV Area (VTI):     1.65 cm    RVOT Peak grad: 1 mmHg AV Vmax:           113.67 cm/s AV Vmean:          86.200 cm/s AV VTI:            0.186 m AV Peak  Grad:      5.2 mmHg AV Mean Grad:      3.7 mmHg LVOT Vmax:         58.40 cm/s LVOT Vmean:        39.900 cm/s LVOT VTI:          0.098 m LVOT/AV VTI ratio: 0.52  AORTA Ao Root diam: 2.90 cm MITRAL VALVE                        TRICUSPID VALVE MV Area (PHT): 6.32 cm             TR Peak grad:   52.6 mmHg MV PHT:        34.80 msec           TR Vmax:  364.00 cm/s MV Decel Time: 120 msec MV E velocity: 53.40 cm/s 103 cm/s  SHUNTS MV A velocity: 94.10 cm/s 70.3 cm/s Systemic VTI:  0.10 m MV E/A ratio:  0.57       1.5       Systemic Diam: 2.00 cm  Serafina Royals MD Electronically signed by Serafina Royals MD Signature Date/Time: 01/20/2019/4:41:26 PM    Final     PERFORMANCE STATUS (ECOG) : 2 - Symptomatic, <50% confined to bed  Review of Systems Unable to complete  Physical Exam General: NAD, frail appearing, thin Pulmonary: Unlabored, on O2 Extremities: no edema, no joint deformities Skin: no rashes Neurological: Weakness, confusion  IMPRESSION: Patient seen in the PACU, which was serving as an overflow bed.  She was comfortable appearing and denies any distressing symptoms or acute complaints.  Unfortunately, patient was confused and only oriented to person.  She was perseverating on having lost her dentures.  I was unable to have a meaningful conversation with her regarding her goals.  I called and spoke with patient's sister.  We discussed patient's current medical problems.  She says that she has a planned meeting tomorrow morning with Dr. Rogue Bussing to discuss treatment options.  Sister seemed to be in agreement with the current scope of treatment.  Sister is unsure if patient has any ACP documents but plans to look for those at home. Sister says she has not had any previous conversations with patient regarding her goals or medical decision making.  PLAN: -Continue current scope of treatment -We will follow and continue to engage patient/family in conversations regarding goals -We will  happily follow in outpatient clinic   Time Total: 30 minutes  Visit consisted of counseling and education dealing with the complex and emotionally intense issues of symptom management and palliative care in the setting of serious and potentially life-threatening illness.Greater than 50%  of this time was spent counseling and coordinating care related to the above assessment and plan.  Signed by: Altha Harm, PhD, NP-C

## 2019-02-03 NOTE — Consult Note (Signed)
ANTICOAGULATION CONSULT NOTE  Pharmacy Consult for Heparin Drip Indication: pulmonary embolus  No Known Allergies  Patient Measurements: Height: 5\' 6"  (167.6 cm) Weight: 132 lb (59.9 kg) IBW/kg (Calculated) : 59.3 Heparin Dosing Weight: 59.9kg  Vital Signs: BP: 167/94 (12/14 0700) Pulse Rate: 108 (12/14 0700)  Labs: Recent Labs    02/02/19 1929 02/02/19 2130 02/03/19 0454 02/03/19 1057  HGB 9.6*  --  9.7*  --   HCT 28.8*  --  28.8*  --   PLT 374  --  376  --   APTT 39*  --  100* 158*  LABPROT 18.6*  --   --   --   INR 1.6*  --   --   --   HEPARINUNFRC 2.46*  --  1.73*  --   CREATININE 1.24*  --  1.24*  --   TROPONINIHS 585* 506*  --   --     Estimated Creatinine Clearance: 40.1 mL/min (A) (by C-G formula based on SCr of 1.24 mg/dL (H)).   Medical History: Past Medical History:  Diagnosis Date  . Allergic rhinitis   . Aneurysm of unspecified site ([place])   . Cholelithiasis   . Diabetes type 2, controlled ([place])   . GERD (gastroesophageal reflux disease)   . Hypertension   . Leg DVT (deep venous thromboembolism), acute, bilateral ([place]) 01/21/2019  . Multiple subsegmental pulmonary emboli without acute cor pulmonale ([place]) 01/20/2019  . Neuropathy   . Veno-occlusive disease     Medications:  Unclear on last doses - pt was to take Lovenox 55mg  bid until liver biopsy was performed (12/7) and then transition back to oral Eliquis.   Pt and family members in ED this evening not able to give clear report on last dose of anticoagulant today/yesterday.  Assessment: 48 y old female with bilateral PE - pharmacy has been consulted to initiate/monitor heparin drip.  INR somewhat elevated @ 1.6 (likely due to liver dysfunction) and HGB 9.6.  12/14 @ 0454 aPTT therapeutic x 1, continue current rate and recheck in 6 hrs   Goal of Therapy:  Heparin level 0.3-0.7 units/ml Monitor platelets by anticoagulation protocol: Yes   Plan:  12/14 1057 aPTT= 158.  Hgb 9.7  Plt  376 Supratherapeutic. Will hold Heparin drip x 30 minutes and restart at 900 units/hr.  Patient is to transition to Apixaban this evening 12/14. (5 mg bid- per discussion with MD, not treatment dose)  Will follow daily heparin level/CBC while on heparin drip  Noralee Space, PharmD Clinical Pharmacist 02/03/2019 11:55 AM

## 2019-02-03 NOTE — Consult Note (Signed)
[facility] CONSULT NOTE  Patient Care Team: Revelo, Elyse Jarvis, MD as PCP - General (Family Medicine) Flora Lipps, MD (Pulmonary Disease) [name], MD as Consulting Physician (Oncology)  CHIEF COMPLAINTS/PURPOSE OF CONSULTATION: Carcinoma of unknown primary/bilateral PE  HISTORY OF PRESENTING ILLNESS:  Audrey Chan 68 y.o.  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  Patient was recently evaluated in the oncology clinic-for above new diagnosis of malignancy; and is currently awaiting NGS work-up/also PET scan.  Patient was also switched over from Lovenox to Eliquis on outpatient basis after the liver biopsy.  However patient is admitted to hospital for worsening shortness of breath and overall worsening fatigue.  Repeat CTA shows-bilateral pulmonary emboli with overall improvement of the clot burden.  CT abdomen pelvis-noncontrast shows multiple liver lesions; coarse parenchymal calcification pancreatic tail with low-density and distal ductal dilatation-question pancreatic neoplasm [Limited evaluation given noncontrast exam].  Recent CA 19-9 normal; elevated CA-15- 3/CA 27-29.  As per the notes patient was confused in the morning-however CT head noncontrast normal.  However currently patient is back to her baseline mentation.  Review of Systems  Constitutional: Positive for malaise/fatigue and weight loss. Negative for chills, diaphoresis and fever.  HENT: Negative for nosebleeds and sore throat.   Eyes: Negative for double vision.  Respiratory: Positive for cough and shortness of breath. Negative for hemoptysis, sputum production and wheezing.   Cardiovascular: Positive for chest pain. Negative for palpitations, orthopnea and leg swelling.  Gastrointestinal:  Positive for nausea. Negative for abdominal pain, blood in stool, constipation, diarrhea, heartburn, melena and vomiting.  Genitourinary: Negative for dysuria, frequency and urgency.  Musculoskeletal: Negative for back pain and joint pain.  Skin: Negative.  Negative for itching and rash.  Neurological: Positive for weakness. Negative for dizziness, tingling, focal weakness and headaches.  Endo/Heme/Allergies: Does not bruise/bleed easily.  Psychiatric/Behavioral: Negative for depression. The patient is not nervous/anxious and does not have insomnia.      MEDICAL HISTORY:  Past Medical History:  Diagnosis Date  . Allergic rhinitis   . Aneurysm of unspecified site ([place])   . Cholelithiasis   . Diabetes type 2, controlled ([place])   . GERD (gastroesophageal reflux disease)   . Hypertension   . Leg DVT (deep venous thromboembolism), acute, bilateral ([place]) 01/21/2019  . Multiple subsegmental pulmonary emboli without acute cor pulmonale ([place]) 01/20/2019  . Neuropathy   . Veno-occlusive disease     SURGICAL HISTORY: Past Surgical History:  Procedure Laterality Date  . ABDOMINAL HYSTERECTOMY  1972  . CERVICAL FUSION    . CHOLECYSTECTOMY N/A 12/28/2014   Procedure: LAPAROSCOPIC CHOLECYSTECTOMY;  Surgeon: Sherri Rad, MD;  Location: [facility] ORS;  Service: General;  Laterality: N/A;  . EYE SURGERY     detached retina  . MASS EXCISION Left 05/06/2018   Procedure: EXCISION OF LEFT ANTECUBITAL CYST, DIABETIC;  Surgeon: Vickie Epley, MD;  Location: [facility] ORS;  Service: Vascular;  Laterality: Left;  . PULMONARY THROMBECTOMY Bilateral 01/22/2019   Procedure: PULMONARY THROMBECTOMY;  Surgeon: Algernon Huxley, MD;  Location: [facility];  Service: Cardiovascular;  Laterality: Bilateral;    SOCIAL HISTORY: Social History   Socioeconomic History  . Marital status: Widowed    Spouse name: Not on file  . Number of children: 1  . Years of education: Not on file  . Highest  education level: Not on  file  Occupational History  . Not on file  Tobacco Use  . Smoking status: Former Smoker    Quit date: 02/21/1999    Years since quitting: 19.9  . Smokeless tobacco: Never Used  Substance and Sexual Activity  . Alcohol use: No    Alcohol/week: 0.0 standard drinks  . Drug use: No  . Sexual activity: Not on file  Other Topics Concern  . Not on file  Social History Narrative  . Not on file   Social Determinants of Health   Financial Resource Strain:   . Difficulty of Paying Living Expenses: Not on file  Food Insecurity:   . Worried About Charity fundraiser in the Last Year: Not on file  . Ran Out of Food in the Last Year: Not on file  Transportation Needs:   . Lack of Transportation (Medical): Not on file  . Lack of Transportation (Non-Medical): Not on file  Physical Activity:   . Days of Exercise per Week: Not on file  . Minutes of Exercise per Session: Not on file  Stress:   . Feeling of Stress : Not on file  Social Connections:   . Frequency of Communication with Friends and Family: Not on file  . Frequency of Social Gatherings with Friends and Family: Not on file  . Attends Religious Services: Not on file  . Active Member of Clubs or Organizations: Not on file  . Attends Archivist Meetings: Not on file  . Marital Status: Not on file  Intimate Partner Violence:   . Fear of Current or Ex-Partner: Not on file  . Emotionally Abused: Not on file  . Physically Abused: Not on file  . Sexually Abused: Not on file    FAMILY HISTORY: Family History  Problem Relation Age of Onset  . Lung cancer Father     ALLERGIES:  has No Known Allergies.  MEDICATIONS:  Current Facility-Administered Medications  Medication Dose Route Frequency Provider Last Rate Last Admin  . 0.9 %  sodium chloride infusion  250 mL Intravenous PRN Athena Masse, MD      . acetaminophen (TYLENOL) tablet 500-1,000 mg  500-1,000 mg Oral Q6H PRN Athena Masse, MD      . apixaban Arne Cleveland)  tablet 5 mg  5 mg Oral BID Jennye Boroughs, MD   5 mg at 02/03/19 2002  . cefTRIAXone (ROCEPHIN) 2 g in sodium chloride 0.9 % 100 mL IVPB  2 g Intravenous Q24H Judd Gaudier V, MD 200 mL/hr at 02/02/19 2252 2 g at 02/02/19 2252  . Chlorhexidine Gluconate Cloth 2 % PADS 6 each  6 each Topical Daily Jennye Boroughs, MD      . insulin aspart (novoLOG) 100 UNIT/ML injection           . insulin aspart (novoLOG) injection 0-9 Units  0-9 Units Subcutaneous Q4H Athena Masse, MD   5 Units at 02/03/19 2005  . ipratropium-albuterol (DUONEB) 0.5-2.5 (3) MG/3ML nebulizer solution 3 mL  3 mL Nebulization Q6H PRN Judd Gaudier V, MD      . methylPREDNISolone sodium succinate (SOLU-MEDROL) 40 mg/mL injection 40 mg  40 mg Intravenous Q12H Athena Masse, MD   40 mg at 02/03/19 1116  . metoprolol tartrate (LOPRESSOR) tablet 12.5 mg  12.5 mg Oral BID Judd Gaudier V, MD   12.5 mg at 02/03/19 1113  . sodium chloride flush (NS) 0.9 % injection 3 mL  3 mL Intravenous Q12H Damita Dunnings,  Waldemar Dickens, MD   3 mL at 02/03/19 1116  . sodium chloride flush (NS) 0.9 % injection 3 mL  3 mL Intravenous PRN Athena Masse, MD          .  PHYSICAL EXAMINATION:  Vitals:   02/03/19 1608 02/03/19 1852  BP: 132/82 124/73  Pulse: 94   Resp: 20   Temp: 97.8 F (36.6 C) 98.6 F (37 C)  SpO2: 93%    Filed Weights   02/02/19 1900  Weight: 132 lb (59.9 kg)    Physical Exam  Constitutional: She is oriented to person, place, and time.  Thin built cachectic female patient resting in the bed.  No acute distress.  She is on 3 L of oxygen.  HENT:  Head: Normocephalic and atraumatic.  Mouth/Throat: Oropharynx is clear and moist. No oropharyngeal exudate.  Eyes: Pupils are equal, round, and reactive to light.  Cardiovascular: Normal rate and regular rhythm.  Pulmonary/Chest: No respiratory distress. She has no wheezes.  Decreased air entry bilaterally.  Abdominal: Soft. Bowel sounds are normal. She exhibits no distension and no  mass. There is no abdominal tenderness. There is no rebound and no guarding.  Musculoskeletal:        General: No tenderness or edema. Normal range of motion.     Cervical back: Normal range of motion and neck supple.  Neurological: She is alert and oriented to person, place, and time.  Skin: Skin is warm.  Psychiatric: Affect normal.   LABORATORY DATA:  I have reviewed the data as listed Lab Results  Component Value Date   WBC 9.8 02/03/2019   HGB 9.7 (L) 02/03/2019   HCT 28.8 (L) 02/03/2019   MCV 67.4 (L) 02/03/2019   PLT 376 02/03/2019   Recent Labs    12/31/18 0444 01/01/19 0452 01/22/19 1631 01/30/19 0823 02/02/19 1929 02/03/19 0454  NA 139  --  137 138 135 137  K 3.1*  --  4.5 4.2 3.9 3.8  CL 111  --  110 105 104 105  CO2 19*  --  19* 22 18* 19*  GLUCOSE 186*  --  221* 232* 238* 220*  BUN 17  --  17 15 17 16   CREATININE 1.27*  --  1.33* 1.39* 1.24* 1.24*  CALCIUM 8.2*  --  8.5* 9.0 9.0 8.8*  GFRNONAA 43*  --  41* 39* 44* 44*  GFRAA 50*  --  47* 45* 51* 51*  PROT 6.4*   < > 6.6 8.0 7.4  --   ALBUMIN 3.0*   < > 2.9* 3.3* 3.2*  --   AST 81*   < > 311* 123* 130*  --   ALT 45*   < > 122* 113* 92*  --   ALKPHOS 203*   < > 1,059* 1,627* 1,819*  --   BILITOT 0.5   < > 0.9 0.6 1.0  --   BILIDIR <0.1  --   --   --   --   --   IBILI NOT CALCULATED  --   --   --   --   --    < > = values in this interval not displayed.    RADIOGRAPHIC STUDIES: I have personally reviewed the radiological images as listed and agreed with the findings in the report. CT ABDOMEN PELVIS WO CONTRAST  Result Date: 02/02/2019 CLINICAL DATA:  Abdominal distension. Recent diagnosis of pulmonary embolus. Multiple liver lesions post biopsy. EXAM: CT ABDOMEN AND PELVIS WITHOUT CONTRAST TECHNIQUE:  Multidetector CT imaging of the abdomen and pelvis was performed following the standard protocol without IV contrast. COMPARISON:  Chest CTA earlier this day. Abdominal CT 2 weeks ago 01/20/2019 FINDINGS:  Lower chest: Emphysema scattered subpleural nodules, assessed on chest CT earlier this day. Hepatobiliary: Heterogeneous liver parenchyma. Multiple liver lesions are better defined on prior contrast-enhanced exam. Difficult to assess for change given differences in technique. Postcholecystectomy similar proximal biliary prominence to prior. Motion artifact limits detailed assessment. Pancreas: Motion obscured. Coarse parenchymal calcification in the pancreatic tail. Ductal dilatation in the tail measures up to 8 mm. Heterogeneous low-density in the pancreatic tail, series 2, image 16. Detailed parenchymal assessment is limited in the lack of contrast and motion. Spleen: Normal in size without focal abnormality. Adrenals/Urinary Tract: Bilateral adrenal thickening. No hydronephrosis. There is excreted IV contrast in both renal collecting systems from prior chest CTA. Excreted IV contrast in the urinary bladder. No bladder wall thickening. Stomach/Bowel: Bowel evaluation is limited in the absence of contrast and patient motion. Also paucity of intra-abdominal fat. Stomach is nondistended. No small bowel obstruction or obvious inflammation. Normal appendix. Moderate stool burden throughout the colon. No colonic wall thickening or inflammation. Vascular/Lymphatic: Moderate aortic atherosclerosis. No aneurysm. Limited assessment for adenopathy in the absence of contrast and patient motion. Reproductive: Post hysterectomy per history. No evidence of adnexal mass. Other: Small amount of perihepatic free fluid which has slightly increased in size from prior exam. Fluid is slightly dense and likely represents combination of simple ascites and post biopsy hemorrhage. Small amount of blood dependently in the pelvis. Cannot assess for active extravasation in the absence of IV contrast, however volume of fluid would not suggest this. Musculoskeletal: Postsurgical change of the anterior abdominal wall. A focal bone abnormality.  IMPRESSION: 1. Post recent liver biopsy. Small amount of high-density fluid adjacent to the liver and tracking in the pelvis likely post biopsy hemorrhage. Cannot assess for active extravasation in the absence of IV contrast, however volume of fluid does not suggest this. Multiple liver lesions are better characterized on prior contrast-enhanced exam. 2. Coarse parenchymal calcification in the pancreatic tail with heterogeneous low-density and distal pancreatic ductal dilatation. In the setting of multiple hepatic lesions, findings are concerning for pancreatic neoplasm, however not well-defined on this noncontrast exam. Aortic Atherosclerosis (ICD10-I70.0). Electronically Signed   By: Keith Rake M.D.   On: 02/02/2019 23:20   CT Head Wo Contrast  Result Date: 02/02/2019 CLINICAL DATA:  Altered mental status EXAM: CT HEAD WITHOUT CONTRAST TECHNIQUE: Contiguous axial images were obtained from the base of the skull through the vertex without intravenous contrast. COMPARISON:  12/31/2018 FINDINGS: Brain: Chronic atrophic and ischemic changes are again seen and stable. No acute hemorrhage, acute infarction or space-occupying mass lesion are seen. Vascular: No hyperdense vessel or unexpected calcification. Skull: Normal. Negative for fracture or focal lesion. Sinuses/Orbits: No acute finding. Other: None. IMPRESSION: Chronic atrophic and ischemic changes similar to that seen on the prior exam. No acute abnormality is noted. Electronically Signed   By: Inez Catalina M.D.   On: 02/02/2019 20:31   CT Angio Chest PE W/Cm &/Or Wo Cm  Result Date: 02/02/2019 CLINICAL DATA:  Shortness of breath EXAM: CT ANGIOGRAPHY CHEST WITH CONTRAST TECHNIQUE: Multidetector CT imaging of the chest was performed using the standard protocol during bolus administration of intravenous contrast. Multiplanar CT image reconstructions and MIPs were obtained to evaluate the vascular anatomy. CONTRAST:  48mL OMNIPAQUE IOHEXOL 350 MG/ML  SOLN COMPARISON:  01/20/2019 FINDINGS: Cardiovascular: Thoracic  aorta demonstrates atherosclerotic calcifications without aneurysmal dilatation or dissection. The heart is at the upper limits of normal in size. The pulmonary artery is well visualized with a normal branching pattern. The previously seen pulmonary emboli are again identified bilaterally with some minimal improvement in the small central branches. No significant resolution of the more peripheral emboli is noted. No new significant emboli are noted. Changes of right heart strain are again noted and stable. Mediastinum/Nodes: The esophagus as visualized is within normal limits. No hilar or mediastinal adenopathy is seen. The thoracic inlet is within normal limits. Lungs/Pleura: Diffuse emphysematous changes are noted throughout both lungs. Scattered subpleural nodular changes are seen without significant increase from the prior study. No focal confluent infiltrate is seen. No sizable effusion is noted. No pneumothorax seen. Upper Abdomen: Visualized upper abdomen demonstrates mild perihepatic fluid stable from the prior exam. Stable hypodense lesions are noted throughout the liver consistent with the given clinical history of hepatic metastatic disease. Musculoskeletal: Degenerative changes of the thoracic spine are noted. No acute bony abnormality is seen. Review of the MIP images confirms the above findings. IMPRESSION: Slight improvement in pulmonary emboli bilaterally although the peripheral emboli appear stable. No new emboli are seen. Persistent right heart strain. Emphysematous changes without acute infiltrate. Multiple pulmonary nodules stable from the prior exam which may represent some metastatic disease Hepatic metastatic disease. Aortic Atherosclerosis (ICD10-I70.0) and Emphysema (ICD10-J43.9). Electronically Signed   By: Inez Catalina M.D.   On: 02/02/2019 20:39   CT Angio Chest PE W/Cm &/Or Wo Cm  Result Date: 01/20/2019 CLINICAL DATA:   Suspected covid, elevated LFTs, recent stroke EXAM: CT ANGIOGRAPHY CHEST, abdomen, and pelvis WITH CONTRAST TECHNIQUE: Multidetector CT imaging of the chest, abdomen, and pelvis was performed using the standard protocol during bolus administration of intravenous contrast. Multiplanar CT image reconstructions and MIPs were obtained to evaluate the vascular anatomy. CONTRAST:  25mL OMNIPAQUE IOHEXOL 350 MG/ML SOLN COMPARISON:  CT chest September 13, 2017 FINDINGS: Cardiovascular: There is a optimal opacification of the pulmonary arteries. There is a small nonocclusive thrombus seen within the left main pulmonary artery which extends into the anterior left upper lobe segmental branches and partially occlusive thrombus in the posterior left lower lobe subsegmental branches. There is also partially occlusive thrombus seen within the posterior right upper lobe segmental branch, series 6, image 37. There is also partially occlusive thrombus seen extending into the right middle lobe subsegmental branches and posterior right lower lobe subsegmental branches. The heart is normal in size. No pericardial effusion or thickening. There is slight straightening of the interventricular septum which could be due to early right ventricular heart strain. There is normal three-vessel brachiocephalic anatomy without proximal stenosis. Scattered atherosclerosis is noted. The thoracic aorta is normal in appearance. Mediastinum/Nodes: No hilar, mediastinal, or axillary adenopathy. Thyroid gland, trachea, and esophagus demonstrate no significant findings. Lungs/Pleura: Small subcentimeter pulmonary nodule are again identified as on prior exam dating back to 2019, for example in the posterior right upper lobe there is a 5 mm pulmonary nodule, series 8, image 21 and anterior right lower lobe, series 8, image 54 a 4 mm pulmonary nodule. There is however a new small nodular opacity in the posterior left upper lobe, series 8, image 47 measuring 7 mm  not seen on prior exam. No large airspace consolidation or pleural effusion is seen. Centrilobular emphysematous changes seen within both lung apices. Musculoskeletal: No chest wall abnormality. No acute or significant osseous findings. Review of the MIP images confirms the  above findings. Abdomen/pelvis: Hepatobiliary: There are multiple low-density liver lesions seen throughout which were not seen on the prior exam. There is also heterogeneous enhancement seen throughout the liver parenchyma. Central periportal edema is noted. The portal vein is patent. The patient is status post cholecystectomy. A small amount of perihepatic ascites is noted. Pancreas: Unremarkable. No pancreatic ductal dilatation or surrounding inflammatory changes. Spleen: Normal in size without focal abnormality. Adrenals/Urinary Tract: Both adrenal glands appear normal. The kidneys and collecting system appear normal without evidence of urinary tract calculus or hydronephrosis. Bladder is unremarkable. Stomach/Bowel: The stomach, small bowel, and colon are normal in appearance. No inflammatory changes, wall thickening, or obstructive findings. Vascular/Lymphatic: There are no enlarged mesenteric, retroperitoneal, or pelvic lymph nodes. Scattered aortic atherosclerotic calcifications are seen without aneurysmal dilatation. Reproductive: The uterus and adnexa are unremarkable. Other: No evidence of abdominal wall mass or hernia. Musculoskeletal: No acute or significant osseous findings. IMPRESSION: 1. Bilateral partially occlusive segmental and subsegmental pulmonary emboli as described above. 2. Findings which could be suggestive of early right ventricular heart strain. 3. Multiple unchanged subcentimeter pulmonary nodules as on prior exam dating back to 2019. 4. New nodular opacity within the posterior left upper lobe measuring 7 mm which is nonspecific, and would recommend attention on short interval follow-up exam. 5. Centrilobular  emphysematous changes. 6. Numerous hepatic lesions throughout, likely suggestive of hepatic metastatic disease. 7. Small amount of perihepatic ascites 8. These results were called by telephone at the time of interpretation on 01/20/2019 at 2:07 am to provider [facility] , who verbally acknowledged these results. Electronically Signed   By: Prudencio Pair M.D.   On: 01/20/2019 02:09   CT abd/pelvis w/ IV contrast  Result Date: 01/20/2019 CLINICAL DATA:  Suspected covid, elevated LFTs, recent stroke EXAM: CT ANGIOGRAPHY CHEST, abdomen, and pelvis WITH CONTRAST TECHNIQUE: Multidetector CT imaging of the chest, abdomen, and pelvis was performed using the standard protocol during bolus administration of intravenous contrast. Multiplanar CT image reconstructions and MIPs were obtained to evaluate the vascular anatomy. CONTRAST:  15mL OMNIPAQUE IOHEXOL 350 MG/ML SOLN COMPARISON:  CT chest September 13, 2017 FINDINGS: Cardiovascular: There is a optimal opacification of the pulmonary arteries. There is a small nonocclusive thrombus seen within the left main pulmonary artery which extends into the anterior left upper lobe segmental branches and partially occlusive thrombus in the posterior left lower lobe subsegmental branches. There is also partially occlusive thrombus seen within the posterior right upper lobe segmental branch, series 6, image 37. There is also partially occlusive thrombus seen extending into the right middle lobe subsegmental branches and posterior right lower lobe subsegmental branches. The heart is normal in size. No pericardial effusion or thickening. There is slight straightening of the interventricular septum which could be due to early right ventricular heart strain. There is normal three-vessel brachiocephalic anatomy without proximal stenosis. Scattered atherosclerosis is noted. The thoracic aorta is normal in appearance. Mediastinum/Nodes: No hilar, mediastinal, or axillary adenopathy. Thyroid  gland, trachea, and esophagus demonstrate no significant findings. Lungs/Pleura: Small subcentimeter pulmonary nodule are again identified as on prior exam dating back to 2019, for example in the posterior right upper lobe there is a 5 mm pulmonary nodule, series 8, image 21 and anterior right lower lobe, series 8, image 54 a 4 mm pulmonary nodule. There is however a new small nodular opacity in the posterior left upper lobe, series 8, image 47 measuring 7 mm not seen on prior exam. No large airspace consolidation or pleural effusion is  seen. Centrilobular emphysematous changes seen within both lung apices. Musculoskeletal: No chest wall abnormality. No acute or significant osseous findings. Review of the MIP images confirms the above findings. Abdomen/pelvis: Hepatobiliary: There are multiple low-density liver lesions seen throughout which were not seen on the prior exam. There is also heterogeneous enhancement seen throughout the liver parenchyma. Central periportal edema is noted. The portal vein is patent. The patient is status post cholecystectomy. A small amount of perihepatic ascites is noted. Pancreas: Unremarkable. No pancreatic ductal dilatation or surrounding inflammatory changes. Spleen: Normal in size without focal abnormality. Adrenals/Urinary Tract: Both adrenal glands appear normal. The kidneys and collecting system appear normal without evidence of urinary tract calculus or hydronephrosis. Bladder is unremarkable. Stomach/Bowel: The stomach, small bowel, and colon are normal in appearance. No inflammatory changes, wall thickening, or obstructive findings. Vascular/Lymphatic: There are no enlarged mesenteric, retroperitoneal, or pelvic lymph nodes. Scattered aortic atherosclerotic calcifications are seen without aneurysmal dilatation. Reproductive: The uterus and adnexa are unremarkable. Other: No evidence of abdominal wall mass or hernia. Musculoskeletal: No acute or significant osseous findings.  IMPRESSION: 1. Bilateral partially occlusive segmental and subsegmental pulmonary emboli as described above. 2. Findings which could be suggestive of early right ventricular heart strain. 3. Multiple unchanged subcentimeter pulmonary nodules as on prior exam dating back to 2019. 4. New nodular opacity within the posterior left upper lobe measuring 7 mm which is nonspecific, and would recommend attention on short interval follow-up exam. 5. Centrilobular emphysematous changes. 6. Numerous hepatic lesions throughout, likely suggestive of hepatic metastatic disease. 7. Small amount of perihepatic ascites 8. These results were called by telephone at the time of interpretation on 01/20/2019 at 2:07 am to provider [facility] , who verbally acknowledged these results. Electronically Signed   By: Prudencio Pair M.D.   On: 01/20/2019 02:09   PERIPHERAL VASCULAR CATHETERIZATION  Result Date: 01/22/2019 See op note  US Venous Img Lower Bilateral (DVT)  Result Date: 01/20/2019 CLINICAL DATA:  History of pulmonary embolism, now with lower extremity pain. Evaluate for DVT. EXAM: BILATERAL LOWER EXTREMITY VENOUS DOPPLER ULTRASOUND TECHNIQUE: Gray-scale sonography with graded compression, as well as color Doppler and duplex ultrasound were performed to evaluate the lower extremity deep venous systems from the level of the common femoral vein and including the common femoral, femoral, profunda femoral, popliteal and calf veins including the posterior tibial, peroneal and gastrocnemius veins when visible. The superficial great saphenous vein was also interrogated. Spectral Doppler was utilized to evaluate flow at rest and with distal augmentation maneuvers in the common femoral, femoral and popliteal veins. COMPARISON:  Chest CTA-01/20/2019 FINDINGS: RIGHT LOWER EXTREMITY Common Femoral Vein: No evidence of thrombus. Normal compressibility, respiratory phasicity and response to augmentation. Saphenofemoral Junction: No  evidence of thrombus. Normal compressibility and flow on color Doppler imaging. Profunda Femoral Vein: No evidence of thrombus. Normal compressibility and flow on color Doppler imaging. Femoral Vein: No evidence of thrombus. Normal compressibility, respiratory phasicity and response to augmentation. Popliteal Vein: No evidence of thrombus. Normal compressibility, respiratory phasicity and response to augmentation. Calf Veins: There is hypoechoic occlusive thrombus within both paired right peroneal veins (images 58 and 59). Both paired right posterior tibial veins appear patent where imaged. Superficial Great Saphenous Vein: No evidence of thrombus. Normal compressibility. Other Findings:  None. _________________________________________________________ LEFT LOWER EXTREMITY Common Femoral Vein: No evidence of thrombus. Normal compressibility, respiratory phasicity and response to augmentation. Saphenofemoral Junction: No evidence of thrombus. Normal compressibility and flow on color Doppler imaging. Profunda Femoral Vein: No  evidence of thrombus. Normal compressibility and flow on color Doppler imaging. Femoral Vein: No evidence of thrombus. Normal compressibility, respiratory phasicity and response to augmentation. Popliteal Vein: No evidence of thrombus. Normal compressibility, respiratory phasicity and response to augmentation. Calf Veins: There is hypoechoic occlusive thrombus within 1 of the paired left peroneal veins (images 29 and 30). The adjacent paired left peroneal vein as well though both paired posterior tibial veins appear patent. Superficial Great Saphenous Vein: No evidence of thrombus. Normal compressibility. Other Findings:  None. IMPRESSION: Examination is positive for occlusive DVT involving both paired right peroneal veins as well as one of two paired left peroneal veins. There is no extension of this distal tibial DVT to the more proximal venous system of either lower extremity. Electronically  Signed   By: [name] Mariscal M.D.   On: 01/20/2019 17:02   CT BIOPSY  Result Date: 01/27/2019 INDICATION: Multiple liver lesions. EXAM: CT directed liver biopsy. MEDICATIONS: None. ANESTHESIA/SEDATION: Moderate (conscious) sedation was employed during this procedure. A total of Versed 2 mg and Fentanyl 25 mcg was administered intravenously. Moderate Sedation Time: 14 minutes. The patient's level of consciousness and vital signs were monitored continuously by radiology nursing throughout the procedure under my direct supervision. FLUOROSCOPY TIME:  Fluoroscopy Time: 0 minutes 0 seconds (0 mGy). COMPLICATIONS: None immediate. PROCEDURE: After discussing the risks and benefits of this procedure the patient informed consent was obtained. Standard time-out procedure was performed. Patient was sterilely prepped and draped. Following local anesthesia with 1% lidocaine and IV conscious sedation CT-directed liver lesion core biopsy was obtained with 2 good 23 mm 18 gauge core biopsy samples obtained. Sample sent to pathology. There no complications. IMPRESSION: Successful liver lesion CT-directed biopsy. Electronically Signed   By: Marcello Moores  Register   On: 01/27/2019 11:47   DG Chest Portable 1 View  Result Date: 02/02/2019 CLINICAL DATA:  Shortness of breath, stomach pain EXAM: PORTABLE CHEST 1 VIEW COMPARISON:  01/23/2019 FINDINGS: The heart size and mediastinal contours are within normal limits. Emphysema. The visualized skeletal structures are unremarkable. IMPRESSION: Emphysema without acute abnormality of the lungs in AP portable projection. Electronically Signed   By: Eddie Candle M.D.   On: 02/02/2019 19:06   DG Chest Port 1 View  Result Date: 01/23/2019 CLINICAL DATA:  Hypertension, pulmonary emboli EXAM: PORTABLE CHEST 1 VIEW COMPARISON:  01/20/2019, 01/19/2019 FINDINGS: The heart size and mediastinal contours are stable. No new focal airspace consolidation, pleural effusion, or pneumothorax. Lungs are  mildly hyperexpanded. The visualized skeletal structures are unremarkable. IMPRESSION: Stable appearance of the chest without new airspace opacity. Electronically Signed   By: Davina Poke M.D.   On: 01/23/2019 12:54   DG Chest Portable 1 View  Result Date: 01/19/2019 CLINICAL DATA:  Shortness of breath EXAM: PORTABLE CHEST 1 VIEW COMPARISON:  12/31/2018 FINDINGS: Cardiomegaly. Lungs clear. No effusions or edema. No acute bony abnormality. IMPRESSION: Cardiomegaly.  No active disease. Electronically Signed   By: Rolm Baptise M.D.   On: 01/19/2019 23:33   ECHOCARDIOGRAM COMPLETE  Result Date: 02/03/2019   ECHOCARDIOGRAM REPORT   Patient Name:   KEBA FLORESCA Date of Exam: 02/03/2019 Medical Rec #:  [id number]         Height:       66.0 in Accession #:    [id number]        Weight:       132.0 lb Date of Birth:  1950-04-05         BSA:  1.68 m Patient Age:    23 years          BP:           145/86 mmHg Patient Gender: F                 HR:           95 bpm. Exam Location:  [facility] Procedure: 2D Echo, Cardiac Doppler and Color Doppler Indications:     I50.31 CHF-Acute Diastolic  History:         Patient has prior history of Echocardiogram examinations, most                  recent 01/20/2019. Risk Factors:Hypertension and Diabetes.  Sonographer:     Charmayne Sheer RDCS (AE) Referring Phys:  [id number] Athena Masse Diagnosing Phys: Kathlyn Sacramento MD  Sonographer Comments: Suboptimal subcostal window. IMPRESSIONS  1. Left ventricular ejection fraction, by visual estimation, is 55 to 60%. The left ventricle has normal function. There is mildly increased left ventricular hypertrophy.  2. Left ventricular diastolic parameters are consistent with Grade I diastolic dysfunction (impaired relaxation).  3. Right ventricular pressure overload.  4. The left ventricle has no regional wall motion abnormalities.  5. Global right ventricle has mildly reduced systolic function.The right ventricular size is moderately  enlarged. No increase in right ventricular wall thickness.  6. Left atrial size was normal.  7. Right atrial size was moderately dilated.  8. The mitral valve is normal in structure. No evidence of mitral valve regurgitation. No evidence of mitral stenosis.  9. The tricuspid valve is normal in structure. Tricuspid valve regurgitation moderate. 10. The aortic valve is normal in structure. Aortic valve regurgitation is not visualized. Mild to moderate aortic valve sclerosis/calcification without any evidence of aortic stenosis. 11. The pulmonic valve was normal in structure. Pulmonic valve regurgitation is not visualized. 12. Moderately to severely elevated pulmonary artery systolic pressure. 13. The tricuspid regurgitant velocity is 4.02 m/s, and with an assumed right atrial pressure of 5 mmHg, the estimated right ventricular systolic pressure is moderately elevated at 69.6 mmHg. FINDINGS  Left Ventricle: Left ventricular ejection fraction, by visual estimation, is 55 to 60%. The left ventricle has normal function. The left ventricle has no regional wall motion abnormalities. There is mildly increased left ventricular hypertrophy. The interventricular septum is flattened in systole, consistent with right ventricular pressure overload. Left ventricular diastolic parameters are consistent with Grade I diastolic dysfunction (impaired relaxation). Normal left atrial pressure. Right Ventricle: The right ventricular size is moderately enlarged. No increase in right ventricular wall thickness. Global RV systolic function is has mildly reduced systolic function. The tricuspid regurgitant velocity is 4.02 m/s, and with an assumed right atrial pressure of 5 mmHg, the estimated right ventricular systolic pressure is moderately elevated at 69.6 mmHg. Left Atrium: Left atrial size was normal in size. Right Atrium: Right atrial size was moderately dilated Pericardium: There is no evidence of pericardial effusion. Mitral Valve: The  mitral valve is normal in structure. No evidence of mitral valve regurgitation. No evidence of mitral valve stenosis by observation. MV peak gradient, 4.4 mmHg. Tricuspid Valve: The tricuspid valve is normal in structure. Tricuspid valve regurgitation moderate. Aortic Valve: The aortic valve is normal in structure. Aortic valve regurgitation is not visualized. Mild to moderate aortic valve sclerosis/calcification is present, without any evidence of aortic stenosis. Aortic valve mean gradient measures 4.0 mmHg. Aortic valve peak gradient measures 6.9 mmHg. Aortic valve area, by VTI measures  2.09 cm. Pulmonic Valve: The pulmonic valve was normal in structure. Pulmonic valve regurgitation is not visualized. Pulmonic regurgitation is not visualized. Aorta: The aortic root, ascending aorta and aortic arch are all structurally normal, with no evidence of dilitation or obstruction. IAS/Shunts: No atrial level shunt detected by color flow Doppler. There is no evidence of a patent foramen ovale. No ventricular septal defect is seen or detected. There is no evidence of an atrial septal defect.  LEFT VENTRICLE PLAX 2D LVIDd:         3.70 cm  Diastology LVIDs:         2.41 cm  LV e' lateral:   6.64 cm/s LV PW:         0.78 cm  LV E/e' lateral: 7.4 LV IVS:        0.71 cm LVOT diam:     1.90 cm LV SV:         38 ml LV SV Index:   22.62 LVOT Area:     2.84 cm  RIGHT VENTRICLE RV Basal diam:  2.91 cm LEFT ATRIUM             Index       RIGHT ATRIUM           Index LA diam:        2.80 cm 1.67 cm/m  RA Area:     23.20 cm LA Vol (A2C):   31.5 ml 18.79 ml/m RA Volume:   86.80 ml  51.79 ml/m LA Vol (A4C):   27.0 ml 16.11 ml/m LA Biplane Vol: 29.8 ml 17.78 ml/m  AORTIC VALVE                   PULMONIC VALVE AV Area (Vmax):    1.97 cm    PV Vmax:       0.75 m/s AV Area (Vmean):   1.94 cm    PV Vmean:      54.900 cm/s AV Area (VTI):     2.09 cm    PV VTI:        0.146 m AV Vmax:           131.00 cm/s PV Peak grad:  2.3 mmHg AV  Vmean:          86.200 cm/s PV Mean grad:  1.0 mmHg AV VTI:            0.209 m AV Peak Grad:      6.9 mmHg AV Mean Grad:      4.0 mmHg LVOT Vmax:         90.90 cm/s LVOT Vmean:        59.100 cm/s LVOT VTI:          0.154 m LVOT/AV VTI ratio: 0.74  AORTA Ao Root diam: 2.70 cm MITRAL VALVE                        TRICUSPID VALVE MV Area (PHT): 3.91 cm             TR Peak grad:   64.6 mmHg MV Peak grad:  4.4 mmHg             TR Vmax:        421.00 cm/s MV Mean grad:  2.0 mmHg MV Vmax:       1.05 m/s             SHUNTS MV Vmean:  64.9 cm/s            Systemic VTI:  0.15 m MV VTI:        0.14 m               Systemic Diam: 1.90 cm MV PHT:        56.26 msec MV Decel Time: 194 msec MV E velocity: 49.00 cm/s 103 cm/s MV A velocity: 97.00 cm/s 70.3 cm/s MV E/A ratio:  0.51       1.5  Kathlyn Sacramento MD Electronically signed by Kathlyn Sacramento MD Signature Date/Time: 02/03/2019/1:36:07 PM    Final    ECHOCARDIOGRAM COMPLETE  Result Date: 01/20/2019   ECHOCARDIOGRAM REPORT   Patient Name:   TRENITA RIVERE Date of Exam: 01/20/2019 Medical Rec #:  [id number]         Height:       66.0 in Accession #:    [id number]        Weight:       122.0 lb Date of Birth:  1950/10/11         BSA:          1.62 m Patient Age:    7 years          BP:           116/71 mmHg Patient Gender: F                 HR:           91 bpm. Exam Location:  [facility] Procedure: 2D Echo, Cardiac Doppler and Color Doppler Indications:     Abnormal ECG 794.31  History:         Patient has no prior history of Echocardiogram examinations.                  Risk Factors:Hypertension and Diabetes.  Sonographer:     Sherrie Sport RDCS (AE) Referring Phys:  [id number] Victoriano Lain A THOMAS Diagnosing Phys: Serafina Royals MD IMPRESSIONS  1. Left ventricular ejection fraction, by visual estimation, is 55 to 60%. The left ventricle has normal function. There is no left ventricular hypertrophy.  2. Global right ventricle has moderately reduced systolic function.The right  ventricular size is moderately enlarged. Mildly increased right ventricular wall thickness.  3. Left atrial size was normal.  4. Right atrial size was moderately dilated.  5. The mitral valve is normal in structure. Trace mitral valve regurgitation.  6. The tricuspid valve is normal in structure. Tricuspid valve regurgitation moderate.  7. The aortic valve is normal in structure. Aortic valve regurgitation is not visualized.  8. The pulmonic valve was grossly normal. Pulmonic valve regurgitation is trivial.  9. Moderately elevated pulmonary artery systolic pressure. 10. The tricuspid regurgitant velocity is 3.62 m/s, and with an assumed right atrial pressure of 10 mmHg, the estimated right ventricular systolic pressure is moderately elevated at 62.6 mmHg. FINDINGS  Left Ventricle: Left ventricular ejection fraction, by visual estimation, is 55 to 60%. The left ventricle has normal function. There is no left ventricular hypertrophy. Right Ventricle: The right ventricular size is moderately enlarged. Mildly increased right ventricular wall thickness. Global RV systolic function is has moderately reduced systolic function. The tricuspid regurgitant velocity is 3.62 m/s, and with an assumed right atrial pressure of 10 mmHg, the estimated right ventricular systolic pressure is moderately elevated at 62.6 mmHg. Left Atrium: Left atrial size was normal in size. Right Atrium: Right atrial size was moderately dilated Pericardium: There  is no evidence of pericardial effusion. Mitral Valve: The mitral valve is normal in structure. Trace mitral valve regurgitation. Tricuspid Valve: The tricuspid valve is normal in structure. Tricuspid valve regurgitation moderate. Aortic Valve: The aortic valve is normal in structure. Aortic valve regurgitation is not visualized. Aortic valve mean gradient measures 3.7 mmHg. Aortic valve peak gradient measures 5.2 mmHg. Aortic valve area, by VTI measures 1.65 cm. Pulmonic Valve: The pulmonic  valve was grossly normal. Pulmonic valve regurgitation is trivial. Aorta: The aortic root and ascending aorta are structurally normal, with no evidence of dilitation. IAS/Shunts: No atrial level shunt detected by color flow Doppler.  LEFT VENTRICLE PLAX 2D LVIDd:         3.16 cm  Diastology LVIDs:         1.90 cm  LV e' lateral:   5.77 cm/s LV PW:         1.41 cm  LV E/e' lateral: 9.3 LV IVS:        1.11 cm  LV e' medial:    3.92 cm/s LVOT diam:     2.00 cm  LV E/e' medial:  13.6 LV SV:         29 ml LV SV Index:   17.85 LVOT Area:     3.14 cm  RIGHT VENTRICLE RV Basal diam:  4.44 cm RV S prime:     10.20 cm/s TAPSE (M-mode): 3.0 cm LEFT ATRIUM             Index       RIGHT ATRIUM           Index LA diam:        2.40 cm 1.48 cm/m  RA Area:     25.70 cm LA Vol (A2C):   21.1 ml 13.02 ml/m RA Volume:   96.00 ml  59.23 ml/m LA Vol (A4C):   9.6 ml  5.94 ml/m LA Biplane Vol: 14.6 ml 9.01 ml/m  AORTIC VALVE                   PULMONIC VALVE AV Area (Vmax):    1.61 cm    PV Vmax:        0.42 m/s AV Area (Vmean):   1.45 cm    PV Peak grad:   0.7 mmHg AV Area (VTI):     1.65 cm    RVOT Peak grad: 1 mmHg AV Vmax:           113.67 cm/s AV Vmean:          86.200 cm/s AV VTI:            0.186 m AV Peak Grad:      5.2 mmHg AV Mean Grad:      3.7 mmHg LVOT Vmax:         58.40 cm/s LVOT Vmean:        39.900 cm/s LVOT VTI:          0.098 m LVOT/AV VTI ratio: 0.52  AORTA Ao Root diam: 2.90 cm MITRAL VALVE                        TRICUSPID VALVE MV Area (PHT): 6.32 cm             TR Peak grad:   52.6 mmHg MV PHT:        34.80 msec           TR Vmax:  364.00 cm/s MV Decel Time: 120 msec MV E velocity: 53.40 cm/s 103 cm/s  SHUNTS MV A velocity: 94.10 cm/s 70.3 cm/s Systemic VTI:  0.10 m MV E/A ratio:  0.57       1.5       Systemic Diam: 2.00 cm  Serafina Royals MD Electronically signed by Serafina Royals MD Signature Date/Time: 01/20/2019/4:41:26 PM    Final     Liver metastases [facility]) # 68 year old female patient with a  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  Primary unclear; CEA/CA 27-29/ca-15-3-elevated; CA 19-9 normal. Awaiting outpatient PET scan; NGS testing results.  See discussion below  # Bilateral PE-right heart strain-repeat CTA shows no progressive PE; but does show residual blood clots; 2D echo suggestive of right heart strain- currently on anticoagulation/Eliquis.  Currently on oxygen.  # Elevated LFTs-secondary to underlying malignancy-rule out infectious causes  # CKD- stage III-+ need to monitor closely given the recent IV contrast exposure.  # Weight loss-secondary to malignancy.  # Anemia-suggestive of anemia of chronic disease/underlying malignancy; monitor for now; Hb 10- stable.  Recommendations/Plan:   # I had a long discussion the patient regarding her symptomatology likely related to underlying malignancy.  Unfortunately, symptoms seems to be rapidly progressive-concerning for aggressive malignancy.  However further work-up including PET scan; NGS pending.  #Given the rapidity of the symptomatology/aggressive malignancy-discussed that I would recommend proceeding with systemic therapy-with carbo-taxol [for presumed carcinoma of unknown primary-pending above work-up].  Discussed the potential pros and cons of therapy.  Patient unfortunately high risk of side effects/including multiorgan dysfunction from her systemic chemotherapy.  Patient seems to be interested in proceeding with treatment; however as expected patient is quite concerned about the side effects.  As per the patient request, spoke to patient's sister [name] regarding my above recommendations/overall serious concerns about the patient's prognosis.  We will plan to meet tomorrow at 8:00 to review the recommendations in person.   #Given the overall  serious prognosis-recommend evaluation with cardiac care. Discussed with [employer].  Previous left a message for patient's Ms.Emily, NP primary care physician to discuss patient's treatment plan/prognosis.  Thank you Dr. Mal Misty for allowing me to participate in the care of your pleasant patient. Please do not hesitate to contact me with questions or concerns in the interim.  All questions were answered. The patient knows to call the clinic with any problems, questions or concerns.    [name], MD 02/03/2019 8:24 PM

## 2019-02-04 ENCOUNTER — Other Ambulatory Visit: Payer: Self-pay | Admitting: Internal Medicine

## 2019-02-04 DIAGNOSIS — Z515 Encounter for palliative care: Secondary | ICD-10-CM

## 2019-02-04 DIAGNOSIS — C801 Malignant (primary) neoplasm, unspecified: Secondary | ICD-10-CM | POA: Insufficient documentation

## 2019-02-04 LAB — BASIC METABOLIC PANEL
Anion gap: 13 (ref 5–15)
BUN: 21 mg/dL (ref 8–23)
CO2: 20 mmol/L — ABNORMAL LOW (ref 22–32)
Calcium: 9.4 mg/dL (ref 8.9–10.3)
Chloride: 108 mmol/L (ref 98–111)
Creatinine, Ser: 1.4 mg/dL — ABNORMAL HIGH (ref 0.44–1.00)
GFR calc Af Amer: 44 mL/min — ABNORMAL LOW (ref >60)
GFR calc non Af Amer: 38 mL/min — ABNORMAL LOW (ref >60)
Glucose, Bld: 212 mg/dL — ABNORMAL HIGH (ref 70–99)
Potassium: 4 mmol/L (ref 3.5–5.1)
Sodium: 141 mmol/L (ref 135–145)

## 2019-02-04 LAB — CBC
HCT: 30 % — ABNORMAL LOW (ref 36.0–46.0)
Hemoglobin: 10.1 g/dL — ABNORMAL LOW (ref 12.0–15.0)
MCH: 22.9 pg — ABNORMAL LOW (ref 26.0–34.0)
MCHC: 33.7 g/dL (ref 30.0–36.0)
MCV: 67.9 fL — ABNORMAL LOW (ref 80.0–100.0)
Platelets: 407 10*3/uL — ABNORMAL HIGH (ref 150–400)
RBC: 4.42 MIL/uL (ref 3.87–5.11)
RDW: 16.3 % — ABNORMAL HIGH (ref 11.5–15.5)
WBC: 7.5 10*3/uL (ref 4.0–10.5)
nRBC: 0 % (ref 0.0–0.2)

## 2019-02-04 LAB — GLUCOSE, CAPILLARY
Glucose-Capillary: 191 mg/dL — ABNORMAL HIGH (ref 70–99)
Glucose-Capillary: 217 mg/dL — ABNORMAL HIGH (ref 70–99)
Glucose-Capillary: 235 mg/dL — ABNORMAL HIGH (ref 70–99)
Glucose-Capillary: 285 mg/dL — ABNORMAL HIGH (ref 70–99)
Glucose-Capillary: 353 mg/dL — ABNORMAL HIGH (ref 70–99)
Glucose-Capillary: 359 mg/dL — ABNORMAL HIGH (ref 70–99)
Glucose-Capillary: 364 mg/dL — ABNORMAL HIGH (ref 70–99)

## 2019-02-04 MED ORDER — AMLODIPINE BESYLATE 5 MG PO TABS
5.0000 mg | ORAL_TABLET | Freq: Every day | ORAL | Status: DC
  Administered 2019-02-04 – 2019-02-06 (×3): 5 mg via ORAL
  Filled 2019-02-04 (×3): qty 1

## 2019-02-04 MED ORDER — LISINOPRIL 10 MG PO TABS
10.0000 mg | ORAL_TABLET | Freq: Every day | ORAL | Status: DC
  Administered 2019-02-04 – 2019-02-06 (×3): 10 mg via ORAL
  Filled 2019-02-04: qty 1
  Filled 2019-02-04: qty 2
  Filled 2019-02-04 (×2): qty 1

## 2019-02-04 MED ORDER — ALBUTEROL SULFATE HFA 108 (90 BASE) MCG/ACT IN AERS: 2.0000 | INHALATION_SPRAY | Freq: Four times a day (QID) | RESPIRATORY_TRACT | Status: DC | PRN

## 2019-02-04 MED ORDER — GABAPENTIN 400 MG PO CAPS
800.0000 mg | ORAL_CAPSULE | Freq: Two times a day (BID) | ORAL | Status: DC
  Administered 2019-02-04 – 2019-02-06 (×5): 800 mg via ORAL
  Filled 2019-02-04: qty 8
  Filled 2019-02-04 (×6): qty 2

## 2019-02-04 MED ORDER — PREDNISONE 20 MG PO TABS
40.0000 mg | ORAL_TABLET | Freq: Every day | ORAL | Status: AC
  Administered 2019-02-05 – 2019-02-06 (×2): 40 mg via ORAL
  Filled 2019-02-04 (×2): qty 2

## 2019-02-04 MED ORDER — VITAMIN D 25 MCG (1000 UNIT) PO TABS
1000.0000 [IU] | ORAL_TABLET | Freq: Every day | ORAL | Status: DC
  Administered 2019-02-04 – 2019-02-06 (×3): 1000 [IU] via ORAL
  Filled 2019-02-04 (×3): qty 1

## 2019-02-04 MED ORDER — POLYETHYLENE GLYCOL 3350 17 G PO PACK: 17.0000 g | PACK | Freq: Every day | ORAL | Status: DC | PRN

## 2019-02-04 MED ORDER — ROSUVASTATIN CALCIUM 20 MG PO TABS
20.0000 mg | ORAL_TABLET | Freq: Every day | ORAL | Status: DC
  Administered 2019-02-04 – 2019-02-06 (×3): 20 mg via ORAL
  Filled 2019-02-04 (×2): qty 1
  Filled 2019-02-04: qty 2
  Filled 2019-02-04: qty 1

## 2019-02-04 MED ORDER — INSULIN GLARGINE 100 UNIT/ML ~~LOC~~ SOLN
25.0000 [IU] | Freq: Every day | SUBCUTANEOUS | Status: DC
  Administered 2019-02-04: 25 [IU] via SUBCUTANEOUS
  Filled 2019-02-04 (×3): qty 0.25

## 2019-02-04 MED ORDER — FERROUS SULFATE 325 (65 FE) MG PO TABS
325.0000 mg | ORAL_TABLET | ORAL | Status: DC
  Administered 2019-02-04 – 2019-02-06 (×2): 325 mg via ORAL
  Filled 2019-02-04 (×2): qty 1

## 2019-02-04 MED ORDER — LORATADINE 10 MG PO TABS
10.0000 mg | ORAL_TABLET | Freq: Every day | ORAL | Status: DC
  Administered 2019-02-04 – 2019-02-06 (×3): 10 mg via ORAL
  Filled 2019-02-04 (×3): qty 1

## 2019-02-04 NOTE — Progress Notes (Signed)
[place]  [phone] [phone]   Name: Audrey Chan Date: 02/04/2019 MRN: [id number]  DOB: [birth year]  Patient Care Team: Theotis Burrow, MD as PCP - General (Family Medicine) Flora Lipps, MD (Pulmonary Disease) [name], MD as Consulting Physician (Oncology)    REASON FOR CONSULTATION: Audrey Chan is a 68 y.o. female with multiple medical problems including COPD, chronic respiratory failure on home O2 at 2 L, history of CVA without residual deficits, CKD 3, who was recently hospitalized 01/22/2019-01/25/2019 for acute DVT/PE and underwent thrombectomy.  She was incidentally found to have hepatic metastases and underwent outpatient biopsy on 01/27/2019.  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  She is readmitted on 02/02/2019 with acute on chronic hypoxic respiratory failure with suspected cor pulmonale due to recent PE.  Palliative care was consulted to help address goals.   CODE STATUS: Full code  PAST MEDICAL HISTORY: Past Medical History:  Diagnosis Date  . Allergic rhinitis   . Aneurysm of unspecified site ([place])   . Cholelithiasis   . Diabetes type 2, controlled ([place])   . GERD (gastroesophageal reflux disease)   . Hypertension   . Leg DVT (deep venous thromboembolism), acute, bilateral ([place]) 01/21/2019  . Multiple subsegmental pulmonary emboli without acute cor pulmonale ([place]) 01/20/2019  . Neuropathy   . Veno-occlusive disease     PAST SURGICAL HISTORY:  Past Surgical History:  Procedure Laterality Date  . ABDOMINAL HYSTERECTOMY  1972  . CERVICAL FUSION    . CHOLECYSTECTOMY N/A 12/28/2014   Procedure: LAPAROSCOPIC CHOLECYSTECTOMY;  Surgeon: Sherri Rad, MD;  Location: [facility] ORS;  Service: General;  Laterality: N/A;  . EYE SURGERY     detached retina  . MASS EXCISION Left 05/06/2018   Procedure: EXCISION OF LEFT ANTECUBITAL CYST,  DIABETIC;  Surgeon: Vickie Epley, MD;  Location: [facility] ORS;  Service: Vascular;  Laterality: Left;  . PULMONARY THROMBECTOMY Bilateral 01/22/2019   Procedure: PULMONARY THROMBECTOMY;  Surgeon: Algernon Huxley, MD;  Location: [facility] [place] [facility];  Service: Cardiovascular;  Laterality: Bilateral;    HEMATOLOGY/ONCOLOGY HISTORY:  Oncology History Overview Note  # DEC 2020- CT scan- MULTIPLE LIVER LESIONS s/p Bx- HIGH GRADE ADENO CA [not melanoma/or vascular tumor]; CEA- 396; CA 15-3-56/CA 27-29-150;CA-19-9-N;   # Bil PE/right heart strain [s/p thrombectomy; Dr.Dew/Dr.Aleskerov; no lysis- sec to recent stroke]; on eliquis  # COPD/  # NGS/MOLECULAR TESTS:p  # PALLIATIVE CARE EVALUATION:p  # PAIN MANAGEMENT: p   DIAGNOSIS:   STAGE:         ;  GOALS:  CURRENT/MOST RECENT THERAPY :     Liver metastases ([place])   Initial Diagnosis   Liver metastases ([place])   02/04/2019 -  Chemotherapy   The patient had PALONOSETRON HCL INJECTION 0.25 MG/5ML, 0.25 mg, Intravenous,  Once, 0 of 3 cycles CARBOplatin (PARAPLATIN) in sodium chloride 0.9 % 100 mL chemo infusion, , Intravenous,  Once, 0 of 3 cycles PACLitaxel (TAXOL) 132 mg in sodium chloride 0.9 % 250 mL chemo infusion (</= '80mg'$ /m2), 80 mg/m2, Intravenous,  Once, 0 of 3 cycles  for chemotherapy treatment.    Carcinoma of unknown primary ([place])  02/04/2019 -  Chemotherapy   The patient had PALONOSETRON HCL INJECTION 0.25 MG/5ML, 0.25 mg, Intravenous,  Once, 0 of 3 cycles CARBOplatin (PARAPLATIN) in sodium chloride 0.9 % 100 mL chemo infusion, , Intravenous,  Once, 0 of 3 cycles PACLitaxel (TAXOL) 132 mg in sodium chloride  0.9 % 250 mL chemo infusion (</= '80mg'$ /m2), 80 mg/m2, Intravenous,  Once, 0 of 3 cycles  for chemotherapy treatment.    02/04/2019 Initial Diagnosis   Carcinoma of unknown primary ([place])     ALLERGIES:  has No Known Allergies.  MEDICATIONS:  Current Facility-Administered Medications  Medication Dose Route Frequency  Provider Last Rate Last Admin  . 0.9 %  sodium chloride infusion  250 mL Intravenous PRN Jennye Boroughs, MD      . acetaminophen (TYLENOL) tablet 500-1,000 mg  500-1,000 mg Oral Q6H PRN Jennye Boroughs, MD      . amLODipine (NORVASC) tablet 5 mg  5 mg Oral Daily Jennye Boroughs, MD   5 mg at 02/04/19 0905  . apixaban (ELIQUIS) tablet 5 mg  5 mg Oral BID Jennye Boroughs, MD   5 mg at 02/04/19 0905  . Chlorhexidine Gluconate Cloth 2 % PADS 6 each  6 each Topical Daily Jennye Boroughs, MD      . cholecalciferol (VITAMIN D3) tablet 1,000 Units  1,000 Units Oral Daily Jennye Boroughs, MD   1,000 Units at 02/04/19 0905  . ferrous sulfate tablet 325 mg  325 mg Oral Tonye Pearson, MD   325 mg at 02/04/19 0904  . gabapentin (NEURONTIN) capsule 800 mg  800 mg Oral BID Jennye Boroughs, MD   800 mg at 02/04/19 0906  . insulin aspart (novoLOG) injection 0-9 Units  0-9 Units Subcutaneous Q4H Jennye Boroughs, MD   5 Units at 02/04/19 1251  . insulin glargine (LANTUS) injection 25 Units  25 Units Subcutaneous QHS Jennye Boroughs, MD      . ipratropium-albuterol (DUONEB) 0.5-2.5 (3) MG/3ML nebulizer solution 3 mL  3 mL Nebulization Q6H PRN Jennye Boroughs, MD      . lisinopril (ZESTRIL) tablet 10 mg  10 mg Oral Daily Jennye Boroughs, MD   10 mg at 02/04/19 0905  . loratadine (CLARITIN) tablet 10 mg  10 mg Oral Daily Jennye Boroughs, MD   10 mg at 02/04/19 0905  . metoprolol tartrate (LOPRESSOR) tablet 12.5 mg  12.5 mg Oral BID Jennye Boroughs, MD   12.5 mg at 02/04/19 0905  . polyethylene glycol (MIRALAX / GLYCOLAX) packet 17 g  17 g Oral Daily PRN Jennye Boroughs, MD      . Derrill Memo ON 02/05/2019] predniSONE (DELTASONE) tablet 40 mg  40 mg Oral Q breakfast Jennye Boroughs, MD      . rosuvastatin (CRESTOR) tablet 20 mg  20 mg Oral Daily Jennye Boroughs, MD   20 mg at 02/04/19 0905  . sodium chloride flush (NS) 0.9 % injection 3 mL  3 mL Intravenous Q12H Jennye Boroughs, MD   3 mL at 02/04/19 0908  . sodium chloride flush  (NS) 0.9 % injection 3 mL  3 mL Intravenous PRN Jennye Boroughs, MD        VITAL SIGNS: BP 129/79 (BP Location: Right Arm)   Pulse 86   Temp 97.9 F (36.6 C) (Oral)   Resp 14   Ht '5\' 6"'$  (1.676 m)   Wt 126 lb 8.7 oz (57.4 kg)   SpO2 91%   BMI 20.42 kg/m  Filed Weights   02/02/19 1900 02/04/19 0347  Weight: 132 lb (59.9 kg) 126 lb 8.7 oz (57.4 kg)    Estimated body mass index is 20.42 kg/m as calculated from the following:   Height as of this encounter: '5\' 6"'$  (1.676 m).   Weight as of this encounter: 126 lb 8.7 oz (57.4 kg).  LABS:  CBC:    Component Value Date/Time   WBC 7.5 02/04/2019 0446   HGB 10.1 (L) 02/04/2019 0446   HCT 30.0 (L) 02/04/2019 0446   PLT 407 (H) 02/04/2019 0446   MCV 67.9 (L) 02/04/2019 0446   NEUTROABS 7.8 (H) 02/02/2019 1929   LYMPHSABS 1.7 02/02/2019 1929   MONOABS 1.0 02/02/2019 1929   EOSABS 0.1 02/02/2019 1929   BASOSABS 0.1 02/02/2019 1929   Comprehensive Metabolic Panel:    Component Value Date/Time   NA 141 02/04/2019 0446   K 4.0 02/04/2019 0446   CL 108 02/04/2019 0446   CO2 20 (L) 02/04/2019 0446   BUN 21 02/04/2019 0446   CREATININE 1.40 (H) 02/04/2019 0446   GLUCOSE 212 (H) 02/04/2019 0446   CALCIUM 9.4 02/04/2019 0446   AST 130 (H) 02/02/2019 1929   ALT 92 (H) 02/02/2019 1929   ALKPHOS 1,819 (H) 02/02/2019 1929   BILITOT 1.0 02/02/2019 1929   PROT 7.4 02/02/2019 1929   ALBUMIN 3.2 (L) 02/02/2019 1929    RADIOGRAPHIC STUDIES: CT ABDOMEN PELVIS WO CONTRAST  Result Date: 02/02/2019 CLINICAL DATA:  Abdominal distension. Recent diagnosis of pulmonary embolus. Multiple liver lesions post biopsy. EXAM: CT ABDOMEN AND PELVIS WITHOUT CONTRAST TECHNIQUE: Multidetector CT imaging of the abdomen and pelvis was performed following the standard protocol without IV contrast. COMPARISON:  Chest CTA earlier this day. Abdominal CT 2 weeks ago 01/20/2019 FINDINGS: Lower chest: Emphysema scattered subpleural nodules, assessed on chest CT  earlier this day. Hepatobiliary: Heterogeneous liver parenchyma. Multiple liver lesions are better defined on prior contrast-enhanced exam. Difficult to assess for change given differences in technique. Postcholecystectomy similar proximal biliary prominence to prior. Motion artifact limits detailed assessment. Pancreas: Motion obscured. Coarse parenchymal calcification in the pancreatic tail. Ductal dilatation in the tail measures up to 8 mm. Heterogeneous low-density in the pancreatic tail, series 2, image 16. Detailed parenchymal assessment is limited in the lack of contrast and motion. Spleen: Normal in size without focal abnormality. Adrenals/Urinary Tract: Bilateral adrenal thickening. No hydronephrosis. There is excreted IV contrast in both renal collecting systems from prior chest CTA. Excreted IV contrast in the urinary bladder. No bladder wall thickening. Stomach/Bowel: Bowel evaluation is limited in the absence of contrast and patient motion. Also paucity of intra-abdominal fat. Stomach is nondistended. No small bowel obstruction or obvious inflammation. Normal appendix. Moderate stool burden throughout the colon. No colonic wall thickening or inflammation. Vascular/Lymphatic: Moderate aortic atherosclerosis. No aneurysm. Limited assessment for adenopathy in the absence of contrast and patient motion. Reproductive: Post hysterectomy per history. No evidence of adnexal mass. Other: Small amount of perihepatic free fluid which has slightly increased in size from prior exam. Fluid is slightly dense and likely represents combination of simple ascites and post biopsy hemorrhage. Small amount of blood dependently in the pelvis. Cannot assess for active extravasation in the absence of IV contrast, however volume of fluid would not suggest this. Musculoskeletal: Postsurgical change of the anterior abdominal wall. A focal bone abnormality. IMPRESSION: 1. Post recent liver biopsy. Small amount of high-density fluid  adjacent to the liver and tracking in the pelvis likely post biopsy hemorrhage. Cannot assess for active extravasation in the absence of IV contrast, however volume of fluid does not suggest this. Multiple liver lesions are better characterized on prior contrast-enhanced exam. 2. Coarse parenchymal calcification in the pancreatic tail with heterogeneous low-density and distal pancreatic ductal dilatation. In the setting of multiple hepatic lesions, findings are concerning for pancreatic neoplasm, however not well-defined on this  noncontrast exam. Aortic Atherosclerosis (ICD10-I70.0). Electronically Signed   By: Keith Rake M.D.   On: 02/02/2019 23:20   CT Head Wo Contrast  Result Date: 02/02/2019 CLINICAL DATA:  Altered mental status EXAM: CT HEAD WITHOUT CONTRAST TECHNIQUE: Contiguous axial images were obtained from the base of the skull through the vertex without intravenous contrast. COMPARISON:  12/31/2018 FINDINGS: Brain: Chronic atrophic and ischemic changes are again seen and stable. No acute hemorrhage, acute infarction or space-occupying mass lesion are seen. Vascular: No hyperdense vessel or unexpected calcification. Skull: Normal. Negative for fracture or focal lesion. Sinuses/Orbits: No acute finding. Other: None. IMPRESSION: Chronic atrophic and ischemic changes similar to that seen on the prior exam. No acute abnormality is noted. Electronically Signed   By: Inez Catalina M.D.   On: 02/02/2019 20:31   CT Angio Chest PE W/Cm &/Or Wo Cm  Result Date: 02/02/2019 CLINICAL DATA:  Shortness of breath EXAM: CT ANGIOGRAPHY CHEST WITH CONTRAST TECHNIQUE: Multidetector CT imaging of the chest was performed using the standard protocol during bolus administration of intravenous contrast. Multiplanar CT image reconstructions and MIPs were obtained to evaluate the vascular anatomy. CONTRAST:  27m OMNIPAQUE IOHEXOL 350 MG/ML SOLN COMPARISON:  01/20/2019 FINDINGS: Cardiovascular: Thoracic aorta  demonstrates atherosclerotic calcifications without aneurysmal dilatation or dissection. The heart is at the upper limits of normal in size. The pulmonary artery is well visualized with a normal branching pattern. The previously seen pulmonary emboli are again identified bilaterally with some minimal improvement in the small central branches. No significant resolution of the more peripheral emboli is noted. No new significant emboli are noted. Changes of right heart strain are again noted and stable. Mediastinum/Nodes: The esophagus as visualized is within normal limits. No hilar or mediastinal adenopathy is seen. The thoracic inlet is within normal limits. Lungs/Pleura: Diffuse emphysematous changes are noted throughout both lungs. Scattered subpleural nodular changes are seen without significant increase from the prior study. No focal confluent infiltrate is seen. No sizable effusion is noted. No pneumothorax seen. Upper Abdomen: Visualized upper abdomen demonstrates mild perihepatic fluid stable from the prior exam. Stable hypodense lesions are noted throughout the liver consistent with the given clinical history of hepatic metastatic disease. Musculoskeletal: Degenerative changes of the thoracic spine are noted. No acute bony abnormality is seen. Review of the MIP images confirms the above findings. IMPRESSION: Slight improvement in pulmonary emboli bilaterally although the peripheral emboli appear stable. No new emboli are seen. Persistent right heart strain. Emphysematous changes without acute infiltrate. Multiple pulmonary nodules stable from the prior exam which may represent some metastatic disease Hepatic metastatic disease. Aortic Atherosclerosis (ICD10-I70.0) and Emphysema (ICD10-J43.9). Electronically Signed   By: MInez CatalinaM.D.   On: 02/02/2019 20:39   CT Angio Chest PE W/Cm &/Or Wo Cm  Result Date: 01/20/2019 CLINICAL DATA:  Suspected covid, elevated LFTs, recent stroke EXAM: CT ANGIOGRAPHY  CHEST, abdomen, and pelvis WITH CONTRAST TECHNIQUE: Multidetector CT imaging of the chest, abdomen, and pelvis was performed using the standard protocol during bolus administration of intravenous contrast. Multiplanar CT image reconstructions and MIPs were obtained to evaluate the vascular anatomy. CONTRAST:  763mOMNIPAQUE IOHEXOL 350 MG/ML SOLN COMPARISON:  CT chest September 13, 2017 FINDINGS: Cardiovascular: There is a optimal opacification of the pulmonary arteries. There is a small nonocclusive thrombus seen within the left main pulmonary artery which extends into the anterior left upper lobe segmental branches and partially occlusive thrombus in the posterior left lower lobe subsegmental branches. There is also partially occlusive thrombus  seen within the posterior right upper lobe segmental branch, series 6, image 37. There is also partially occlusive thrombus seen extending into the right middle lobe subsegmental branches and posterior right lower lobe subsegmental branches. The heart is normal in size. No pericardial effusion or thickening. There is slight straightening of the interventricular septum which could be due to early right ventricular heart strain. There is normal three-vessel brachiocephalic anatomy without proximal stenosis. Scattered atherosclerosis is noted. The thoracic aorta is normal in appearance. Mediastinum/Nodes: No hilar, mediastinal, or axillary adenopathy. Thyroid gland, trachea, and esophagus demonstrate no significant findings. Lungs/Pleura: Small subcentimeter pulmonary nodule are again identified as on prior exam dating back to 2019, for example in the posterior right upper lobe there is a 5 mm pulmonary nodule, series 8, image 21 and anterior right lower lobe, series 8, image 54 a 4 mm pulmonary nodule. There is however a new small nodular opacity in the posterior left upper lobe, series 8, image 47 measuring 7 mm not seen on prior exam. No large airspace consolidation or pleural  effusion is seen. Centrilobular emphysematous changes seen within both lung apices. Musculoskeletal: No chest wall abnormality. No acute or significant osseous findings. Review of the MIP images confirms the above findings. Abdomen/pelvis: Hepatobiliary: There are multiple low-density liver lesions seen throughout which were not seen on the prior exam. There is also heterogeneous enhancement seen throughout the liver parenchyma. Central periportal edema is noted. The portal vein is patent. The patient is status post cholecystectomy. A small amount of perihepatic ascites is noted. Pancreas: Unremarkable. No pancreatic ductal dilatation or surrounding inflammatory changes. Spleen: Normal in size without focal abnormality. Adrenals/Urinary Tract: Both adrenal glands appear normal. The kidneys and collecting system appear normal without evidence of urinary tract calculus or hydronephrosis. Bladder is unremarkable. Stomach/Bowel: The stomach, small bowel, and colon are normal in appearance. No inflammatory changes, wall thickening, or obstructive findings. Vascular/Lymphatic: There are no enlarged mesenteric, retroperitoneal, or pelvic lymph nodes. Scattered aortic atherosclerotic calcifications are seen without aneurysmal dilatation. Reproductive: The uterus and adnexa are unremarkable. Other: No evidence of abdominal wall mass or hernia. Musculoskeletal: No acute or significant osseous findings. IMPRESSION: 1. Bilateral partially occlusive segmental and subsegmental pulmonary emboli as described above. 2. Findings which could be suggestive of early right ventricular heart strain. 3. Multiple unchanged subcentimeter pulmonary nodules as on prior exam dating back to 2019. 4. New nodular opacity within the posterior left upper lobe measuring 7 mm which is nonspecific, and would recommend attention on short interval follow-up exam. 5. Centrilobular emphysematous changes. 6. Numerous hepatic lesions throughout, likely  suggestive of hepatic metastatic disease. 7. Small amount of perihepatic ascites 8. These results were called by telephone at the time of interpretation on 01/20/2019 at 2:07 am to provider [facility] , who verbally acknowledged these results. Electronically Signed   By: Prudencio Pair M.D.   On: 01/20/2019 02:09   CT abd/pelvis w/ IV contrast  Result Date: 01/20/2019 CLINICAL DATA:  Suspected covid, elevated LFTs, recent stroke EXAM: CT ANGIOGRAPHY CHEST, abdomen, and pelvis WITH CONTRAST TECHNIQUE: Multidetector CT imaging of the chest, abdomen, and pelvis was performed using the standard protocol during bolus administration of intravenous contrast. Multiplanar CT image reconstructions and MIPs were obtained to evaluate the vascular anatomy. CONTRAST:  57m OMNIPAQUE IOHEXOL 350 MG/ML SOLN COMPARISON:  CT chest September 13, 2017 FINDINGS: Cardiovascular: There is a optimal opacification of the pulmonary arteries. There is a small nonocclusive thrombus seen within the left main pulmonary artery which  extends into the anterior left upper lobe segmental branches and partially occlusive thrombus in the posterior left lower lobe subsegmental branches. There is also partially occlusive thrombus seen within the posterior right upper lobe segmental branch, series 6, image 37. There is also partially occlusive thrombus seen extending into the right middle lobe subsegmental branches and posterior right lower lobe subsegmental branches. The heart is normal in size. No pericardial effusion or thickening. There is slight straightening of the interventricular septum which could be due to early right ventricular heart strain. There is normal three-vessel brachiocephalic anatomy without proximal stenosis. Scattered atherosclerosis is noted. The thoracic aorta is normal in appearance. Mediastinum/Nodes: No hilar, mediastinal, or axillary adenopathy. Thyroid gland, trachea, and esophagus demonstrate no significant findings.  Lungs/Pleura: Small subcentimeter pulmonary nodule are again identified as on prior exam dating back to 2019, for example in the posterior right upper lobe there is a 5 mm pulmonary nodule, series 8, image 21 and anterior right lower lobe, series 8, image 54 a 4 mm pulmonary nodule. There is however a new small nodular opacity in the posterior left upper lobe, series 8, image 47 measuring 7 mm not seen on prior exam. No large airspace consolidation or pleural effusion is seen. Centrilobular emphysematous changes seen within both lung apices. Musculoskeletal: No chest wall abnormality. No acute or significant osseous findings. Review of the MIP images confirms the above findings. Abdomen/pelvis: Hepatobiliary: There are multiple low-density liver lesions seen throughout which were not seen on the prior exam. There is also heterogeneous enhancement seen throughout the liver parenchyma. Central periportal edema is noted. The portal vein is patent. The patient is status post cholecystectomy. A small amount of perihepatic ascites is noted. Pancreas: Unremarkable. No pancreatic ductal dilatation or surrounding inflammatory changes. Spleen: Normal in size without focal abnormality. Adrenals/Urinary Tract: Both adrenal glands appear normal. The kidneys and collecting system appear normal without evidence of urinary tract calculus or hydronephrosis. Bladder is unremarkable. Stomach/Bowel: The stomach, small bowel, and colon are normal in appearance. No inflammatory changes, wall thickening, or obstructive findings. Vascular/Lymphatic: There are no enlarged mesenteric, retroperitoneal, or pelvic lymph nodes. Scattered aortic atherosclerotic calcifications are seen without aneurysmal dilatation. Reproductive: The uterus and adnexa are unremarkable. Other: No evidence of abdominal wall mass or hernia. Musculoskeletal: No acute or significant osseous findings. IMPRESSION: 1. Bilateral partially occlusive segmental and  subsegmental pulmonary emboli as described above. 2. Findings which could be suggestive of early right ventricular heart strain. 3. Multiple unchanged subcentimeter pulmonary nodules as on prior exam dating back to 2019. 4. New nodular opacity within the posterior left upper lobe measuring 7 mm which is nonspecific, and would recommend attention on short interval follow-up exam. 5. Centrilobular emphysematous changes. 6. Numerous hepatic lesions throughout, likely suggestive of hepatic metastatic disease. 7. Small amount of perihepatic ascites 8. These results were called by telephone at the time of interpretation on 01/20/2019 at 2:07 am to provider [facility] , who verbally acknowledged these results. Electronically Signed   By: Prudencio Pair M.D.   On: 01/20/2019 02:09   PERIPHERAL VASCULAR CATHETERIZATION  Result Date: 01/22/2019 See op note  US Venous Img Lower Bilateral (DVT)  Result Date: 01/20/2019 CLINICAL DATA:  History of pulmonary embolism, now with lower extremity pain. Evaluate for DVT. EXAM: BILATERAL LOWER EXTREMITY VENOUS DOPPLER ULTRASOUND TECHNIQUE: Gray-scale sonography with graded compression, as well as color Doppler and duplex ultrasound were performed to evaluate the lower extremity deep venous systems from the level of the common femoral vein  and including the common femoral, femoral, profunda femoral, popliteal and calf veins including the posterior tibial, peroneal and gastrocnemius veins when visible. The superficial great saphenous vein was also interrogated. Spectral Doppler was utilized to evaluate flow at rest and with distal augmentation maneuvers in the common femoral, femoral and popliteal veins. COMPARISON:  Chest CTA-01/20/2019 FINDINGS: RIGHT LOWER EXTREMITY Common Femoral Vein: No evidence of thrombus. Normal compressibility, respiratory phasicity and response to augmentation. Saphenofemoral Junction: No evidence of thrombus. Normal compressibility and flow on color  Doppler imaging. Profunda Femoral Vein: No evidence of thrombus. Normal compressibility and flow on color Doppler imaging. Femoral Vein: No evidence of thrombus. Normal compressibility, respiratory phasicity and response to augmentation. Popliteal Vein: No evidence of thrombus. Normal compressibility, respiratory phasicity and response to augmentation. Calf Veins: There is hypoechoic occlusive thrombus within both paired right peroneal veins (images 58 and 59). Both paired right posterior tibial veins appear patent where imaged. Superficial Great Saphenous Vein: No evidence of thrombus. Normal compressibility. Other Findings:  None. _________________________________________________________ LEFT LOWER EXTREMITY Common Femoral Vein: No evidence of thrombus. Normal compressibility, respiratory phasicity and response to augmentation. Saphenofemoral Junction: No evidence of thrombus. Normal compressibility and flow on color Doppler imaging. Profunda Femoral Vein: No evidence of thrombus. Normal compressibility and flow on color Doppler imaging. Femoral Vein: No evidence of thrombus. Normal compressibility, respiratory phasicity and response to augmentation. Popliteal Vein: No evidence of thrombus. Normal compressibility, respiratory phasicity and response to augmentation. Calf Veins: There is hypoechoic occlusive thrombus within 1 of the paired left peroneal veins (images 29 and 30). The adjacent paired left peroneal vein as well though both paired posterior tibial veins appear patent. Superficial Great Saphenous Vein: No evidence of thrombus. Normal compressibility. Other Findings:  None. IMPRESSION: Examination is positive for occlusive DVT involving both paired right peroneal veins as well as one of two paired left peroneal veins. There is no extension of this distal tibial DVT to the more proximal venous system of either lower extremity. Electronically Signed   By: [name] Mariscal M.D.   On: 01/20/2019 17:02   CT  BIOPSY  Result Date: 01/27/2019 INDICATION: Multiple liver lesions. EXAM: CT directed liver biopsy. MEDICATIONS: None. ANESTHESIA/SEDATION: Moderate (conscious) sedation was employed during this procedure. A total of Versed 2 mg and Fentanyl 25 mcg was administered intravenously. Moderate Sedation Time: 14 minutes. The patient's level of consciousness and vital signs were monitored continuously by radiology nursing throughout the procedure under my direct supervision. FLUOROSCOPY TIME:  Fluoroscopy Time: 0 minutes 0 seconds (0 mGy). COMPLICATIONS: None immediate. PROCEDURE: After discussing the risks and benefits of this procedure the patient informed consent was obtained. Standard time-out procedure was performed. Patient was sterilely prepped and draped. Following local anesthesia with 1% lidocaine and IV conscious sedation CT-directed liver lesion core biopsy was obtained with 2 good 23 mm 18 gauge core biopsy samples obtained. Sample sent to pathology. There no complications. IMPRESSION: Successful liver lesion CT-directed biopsy. Electronically Signed   By: Marcello Moores  Register   On: 01/27/2019 11:47   DG Chest Portable 1 View  Result Date: 02/02/2019 CLINICAL DATA:  Shortness of breath, stomach pain EXAM: PORTABLE CHEST 1 VIEW COMPARISON:  01/23/2019 FINDINGS: The heart size and mediastinal contours are within normal limits. Emphysema. The visualized skeletal structures are unremarkable. IMPRESSION: Emphysema without acute abnormality of the lungs in AP portable projection. Electronically Signed   By: Eddie Candle M.D.   On: 02/02/2019 19:06   DG Chest Port 1 View  Result Date: 01/23/2019  CLINICAL DATA:  Hypertension, pulmonary emboli EXAM: PORTABLE CHEST 1 VIEW COMPARISON:  01/20/2019, 01/19/2019 FINDINGS: The heart size and mediastinal contours are stable. No new focal airspace consolidation, pleural effusion, or pneumothorax. Lungs are mildly hyperexpanded. The visualized skeletal structures are  unremarkable. IMPRESSION: Stable appearance of the chest without new airspace opacity. Electronically Signed   By: Davina Poke M.D.   On: 01/23/2019 12:54   DG Chest Portable 1 View  Result Date: 01/19/2019 CLINICAL DATA:  Shortness of breath EXAM: PORTABLE CHEST 1 VIEW COMPARISON:  12/31/2018 FINDINGS: Cardiomegaly. Lungs clear. No effusions or edema. No acute bony abnormality. IMPRESSION: Cardiomegaly.  No active disease. Electronically Signed   By: Rolm Baptise M.D.   On: 01/19/2019 23:33   ECHOCARDIOGRAM COMPLETE  Result Date: 02/03/2019   ECHOCARDIOGRAM REPORT   Patient Name:   Audrey Chan Date of Exam: 02/03/2019 Medical Rec #:  [id number]         Height:       66.0 in Accession #:    [id number]        Weight:       132.0 lb Date of Birth:  1950-04-13         BSA:          1.68 m Patient Age:    60 years          BP:           145/86 mmHg Patient Gender: F                 HR:           95 bpm. Exam Location:  [facility] Procedure: 2D Echo, Cardiac Doppler and Color Doppler Indications:     I50.31 CHF-Acute Diastolic  History:         Patient has prior history of Echocardiogram examinations, most                  recent 01/20/2019. Risk Factors:Hypertension and Diabetes.  Sonographer:     Charmayne Sheer RDCS (AE) Referring Phys:  [id number] Athena Masse Diagnosing Phys: Kathlyn Sacramento MD  Sonographer Comments: Suboptimal subcostal window. IMPRESSIONS  1. Left ventricular ejection fraction, by visual estimation, is 55 to 60%. The left ventricle has normal function. There is mildly increased left ventricular hypertrophy.  2. Left ventricular diastolic parameters are consistent with Grade I diastolic dysfunction (impaired relaxation).  3. Right ventricular pressure overload.  4. The left ventricle has no regional wall motion abnormalities.  5. Global right ventricle has mildly reduced systolic function.The right ventricular size is moderately enlarged. No increase in right ventricular wall thickness.  6.  Left atrial size was normal.  7. Right atrial size was moderately dilated.  8. The mitral valve is normal in structure. No evidence of mitral valve regurgitation. No evidence of mitral stenosis.  9. The tricuspid valve is normal in structure. Tricuspid valve regurgitation moderate. 10. The aortic valve is normal in structure. Aortic valve regurgitation is not visualized. Mild to moderate aortic valve sclerosis/calcification without any evidence of aortic stenosis. 11. The pulmonic valve was normal in structure. Pulmonic valve regurgitation is not visualized. 12. Moderately to severely elevated pulmonary artery systolic pressure. 13. The tricuspid regurgitant velocity is 4.02 m/s, and with an assumed right atrial pressure of 5 mmHg, the estimated right ventricular systolic pressure is moderately elevated at 69.6 mmHg. FINDINGS  Left Ventricle: Left ventricular ejection fraction, by visual estimation, is 55 to 60%. The left ventricle has normal function.  The left ventricle has no regional wall motion abnormalities. There is mildly increased left ventricular hypertrophy. The interventricular septum is flattened in systole, consistent with right ventricular pressure overload. Left ventricular diastolic parameters are consistent with Grade I diastolic dysfunction (impaired relaxation). Normal left atrial pressure. Right Ventricle: The right ventricular size is moderately enlarged. No increase in right ventricular wall thickness. Global RV systolic function is has mildly reduced systolic function. The tricuspid regurgitant velocity is 4.02 m/s, and with an assumed right atrial pressure of 5 mmHg, the estimated right ventricular systolic pressure is moderately elevated at 69.6 mmHg. Left Atrium: Left atrial size was normal in size. Right Atrium: Right atrial size was moderately dilated Pericardium: There is no evidence of pericardial effusion. Mitral Valve: The mitral valve is normal in structure. No evidence of mitral  valve regurgitation. No evidence of mitral valve stenosis by observation. MV peak gradient, 4.4 mmHg. Tricuspid Valve: The tricuspid valve is normal in structure. Tricuspid valve regurgitation moderate. Aortic Valve: The aortic valve is normal in structure. Aortic valve regurgitation is not visualized. Mild to moderate aortic valve sclerosis/calcification is present, without any evidence of aortic stenosis. Aortic valve mean gradient measures 4.0 mmHg. Aortic valve peak gradient measures 6.9 mmHg. Aortic valve area, by VTI measures 2.09 cm. Pulmonic Valve: The pulmonic valve was normal in structure. Pulmonic valve regurgitation is not visualized. Pulmonic regurgitation is not visualized. Aorta: The aortic root, ascending aorta and aortic arch are all structurally normal, with no evidence of dilitation or obstruction. IAS/Shunts: No atrial level shunt detected by color flow Doppler. There is no evidence of a patent foramen ovale. No ventricular septal defect is seen or detected. There is no evidence of an atrial septal defect.  LEFT VENTRICLE PLAX 2D LVIDd:         3.70 cm  Diastology LVIDs:         2.41 cm  LV e' lateral:   6.64 cm/s LV PW:         0.78 cm  LV E/e' lateral: 7.4 LV IVS:        0.71 cm LVOT diam:     1.90 cm LV SV:         38 ml LV SV Index:   22.62 LVOT Area:     2.84 cm  RIGHT VENTRICLE RV Basal diam:  2.91 cm LEFT ATRIUM             Index       RIGHT ATRIUM           Index LA diam:        2.80 cm 1.67 cm/m  RA Area:     23.20 cm LA Vol (A2C):   31.5 ml 18.79 ml/m RA Volume:   86.80 ml  51.79 ml/m LA Vol (A4C):   27.0 ml 16.11 ml/m LA Biplane Vol: 29.8 ml 17.78 ml/m  AORTIC VALVE                   PULMONIC VALVE AV Area (Vmax):    1.97 cm    PV Vmax:       0.75 m/s AV Area (Vmean):   1.94 cm    PV Vmean:      54.900 cm/s AV Area (VTI):     2.09 cm    PV VTI:        0.146 m AV Vmax:           131.00 cm/s PV Peak grad:  2.3 mmHg AV  Vmean:          86.200 cm/s PV Mean grad:  1.0 mmHg AV VTI:             0.209 m AV Peak Grad:      6.9 mmHg AV Mean Grad:      4.0 mmHg LVOT Vmax:         90.90 cm/s LVOT Vmean:        59.100 cm/s LVOT VTI:          0.154 m LVOT/AV VTI ratio: 0.74  AORTA Ao Root diam: 2.70 cm MITRAL VALVE                        TRICUSPID VALVE MV Area (PHT): 3.91 cm             TR Peak grad:   64.6 mmHg MV Peak grad:  4.4 mmHg             TR Vmax:        421.00 cm/s MV Mean grad:  2.0 mmHg MV Vmax:       1.05 m/s             SHUNTS MV Vmean:      64.9 cm/s            Systemic VTI:  0.15 m MV VTI:        0.14 m               Systemic Diam: 1.90 cm MV PHT:        56.26 msec MV Decel Time: 194 msec MV E velocity: 49.00 cm/s 103 cm/s MV A velocity: 97.00 cm/s 70.3 cm/s MV E/A ratio:  0.51       1.5  Kathlyn Sacramento MD Electronically signed by Kathlyn Sacramento MD Signature Date/Time: 02/03/2019/1:36:07 PM    Final    ECHOCARDIOGRAM COMPLETE  Result Date: 01/20/2019   ECHOCARDIOGRAM REPORT   Patient Name:   Audrey Chan Date of Exam: 01/20/2019 Medical Rec #:  [id number]         Height:       66.0 in Accession #:    [id number]        Weight:       122.0 lb Date of Birth:  [birth year]         BSA:          1.62 m Patient Age:    49 years          BP:           116/71 mmHg Patient Gender: F                 HR:           91 bpm. Exam Location:  [facility] Procedure: 2D Echo, Cardiac Doppler and Color Doppler Indications:     Abnormal ECG 794.31  History:         Patient has no prior history of Echocardiogram examinations.                  Risk Factors:Hypertension and Diabetes.  Sonographer:     Sherrie Sport RDCS (AE) Referring Phys:  [id number] Victoriano Lain A THOMAS Diagnosing Phys: Serafina Royals MD IMPRESSIONS  1. Left ventricular ejection fraction, by visual estimation, is 55 to 60%. The left ventricle has normal function. There is no left ventricular hypertrophy.  2. Global right ventricle has moderately reduced systolic function.The right ventricular size  is moderately enlarged. Mildly increased right  ventricular wall thickness.  3. Left atrial size was normal.  4. Right atrial size was moderately dilated.  5. The mitral valve is normal in structure. Trace mitral valve regurgitation.  6. The tricuspid valve is normal in structure. Tricuspid valve regurgitation moderate.  7. The aortic valve is normal in structure. Aortic valve regurgitation is not visualized.  8. The pulmonic valve was grossly normal. Pulmonic valve regurgitation is trivial.  9. Moderately elevated pulmonary artery systolic pressure. 10. The tricuspid regurgitant velocity is 3.62 m/s, and with an assumed right atrial pressure of 10 mmHg, the estimated right ventricular systolic pressure is moderately elevated at 62.6 mmHg. FINDINGS  Left Ventricle: Left ventricular ejection fraction, by visual estimation, is 55 to 60%. The left ventricle has normal function. There is no left ventricular hypertrophy. Right Ventricle: The right ventricular size is moderately enlarged. Mildly increased right ventricular wall thickness. Global RV systolic function is has moderately reduced systolic function. The tricuspid regurgitant velocity is 3.62 m/s, and with an assumed right atrial pressure of 10 mmHg, the estimated right ventricular systolic pressure is moderately elevated at 62.6 mmHg. Left Atrium: Left atrial size was normal in size. Right Atrium: Right atrial size was moderately dilated Pericardium: There is no evidence of pericardial effusion. Mitral Valve: The mitral valve is normal in structure. Trace mitral valve regurgitation. Tricuspid Valve: The tricuspid valve is normal in structure. Tricuspid valve regurgitation moderate. Aortic Valve: The aortic valve is normal in structure. Aortic valve regurgitation is not visualized. Aortic valve mean gradient measures 3.7 mmHg. Aortic valve peak gradient measures 5.2 mmHg. Aortic valve area, by VTI measures 1.65 cm. Pulmonic Valve: The pulmonic valve was grossly normal. Pulmonic valve regurgitation is  trivial. Aorta: The aortic root and ascending aorta are structurally normal, with no evidence of dilitation. IAS/Shunts: No atrial level shunt detected by color flow Doppler.  LEFT VENTRICLE PLAX 2D LVIDd:         3.16 cm  Diastology LVIDs:         1.90 cm  LV e' lateral:   5.77 cm/s LV PW:         1.41 cm  LV E/e' lateral: 9.3 LV IVS:        1.11 cm  LV e' medial:    3.92 cm/s LVOT diam:     2.00 cm  LV E/e' medial:  13.6 LV SV:         29 ml LV SV Index:   17.85 LVOT Area:     3.14 cm  RIGHT VENTRICLE RV Basal diam:  4.44 cm RV S prime:     10.20 cm/s TAPSE (M-mode): 3.0 cm LEFT ATRIUM             Index       RIGHT ATRIUM           Index LA diam:        2.40 cm 1.48 cm/m  RA Area:     25.70 cm LA Vol (A2C):   21.1 ml 13.02 ml/m RA Volume:   96.00 ml  59.23 ml/m LA Vol (A4C):   9.6 ml  5.94 ml/m LA Biplane Vol: 14.6 ml 9.01 ml/m  AORTIC VALVE                   PULMONIC VALVE AV Area (Vmax):    1.61 cm    PV Vmax:        0.42 m/s AV Area (Vmean):  1.45 cm    PV Peak grad:   0.7 mmHg AV Area (VTI):     1.65 cm    RVOT Peak grad: 1 mmHg AV Vmax:           113.67 cm/s AV Vmean:          86.200 cm/s AV VTI:            0.186 m AV Peak Grad:      5.2 mmHg AV Mean Grad:      3.7 mmHg LVOT Vmax:         58.40 cm/s LVOT Vmean:        39.900 cm/s LVOT VTI:          0.098 m LVOT/AV VTI ratio: 0.52  AORTA Ao Root diam: 2.90 cm MITRAL VALVE                        TRICUSPID VALVE MV Area (PHT): 6.32 cm             TR Peak grad:   52.6 mmHg MV PHT:        34.80 msec           TR Vmax:        364.00 cm/s MV Decel Time: 120 msec MV E velocity: 53.40 cm/s 103 cm/s  SHUNTS MV A velocity: 94.10 cm/s 70.3 cm/s Systemic VTI:  0.10 m MV E/A ratio:  0.57       1.5       Systemic Diam: 2.00 cm  Serafina Royals MD Electronically signed by Serafina Royals MD Signature Date/Time: 01/20/2019/4:41:26 PM    Final     PERFORMANCE STATUS (ECOG) : 3 - Symptomatic, >50% confined to bed  Review of Systems Unless otherwise noted, a  complete review of systems is negative.  Physical Exam General: NAD, frail appearing, thin Pulmonary: Unlabored Extremities: no edema, no joint deformities Skin: no rashes Neurological: Weakness, confusion  IMPRESSION: Patient transferred out of the unit.  She is comfortable appearing.  Patient is still situationally confused but more lucid than she was yesterday.  She denies any acute complaints or concerns.  No distressing symptoms reported.  Dr. Rogue Bussing met with patient's sister this AM. Family has decided to pursue chemotherapy. I called and spoke with patient's sister.  We discussed patient's multiple medical problems.  Sister confirms a desire to pursue treatment.  She is hopeful patient will improve and be able to return home.  We discussed future decision making including CODE STATUS.  Sister feels that she would likely not want patient to be resuscitated but does not want to make that decision right now.  She is hopeful that patient's mental status will continue to improve and that patient might be able to participate in her own decision making.  PLAN: -Continue current prescription treatment -We will follow   Patient expressed understanding and was in agreement with this plan. She also understands that She can call the clinic at any time with any questions, concerns, or complaints.     Time Total: 20 minutes  Visit consisted of counseling and education dealing with the complex and emotionally intense issues of symptom management and palliative care in the setting of serious and potentially life-threatening illness.Greater than 50%  of this time was spent counseling and coordinating care related to the above assessment and plan.  Signed by: Altha Harm, PhD, NP-C

## 2019-02-04 NOTE — Progress Notes (Addendum)
Progress Note    Audrey Chan  [id number] DOB: November 20, 1950  DOA: 02/02/2019 PCP: Theotis Burrow, MD         Assessment/Plan:   Principal Problem:   Acute on chronic respiratory failure with hypoxia ([place]) Active Problems:   Hypertension   Insulin dependent type 2 diabetes mellitus ([place])   Acute metabolic encephalopathy   Liver metastases (HCC)   CKD (chronic kidney disease), stage III   History of pulmonary embolism   History of CVA (cerebrovascular accident) without residual deficits   Pulmonary embolism (HCC)   Cor pulmonale, acute ([place])   COPD with acute bronchitis (HCC)   Elevated troponin   Goals of care, counseling/discussion   Body mass index is 20.42 kg/m.   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 Rogue Bussing, and he plans to start systemic chemotherapy.  CKD stage III: Creatinine is stable.  COPD with probable acute exacerbation: Discontinue IV Rocephin.  Continue bronchodilators.  Change IV Solu-Medrol to oral prednisone  Elevated liver enzymes: Probably from metastatic liver disease  Type 2 diabetes mellitus with hyperglycemia: Hemoglobin A1c was 9.5 on 01/20/2019. Continue Lantus.  NovoLog as needed for hyperglycemia.  Hypertension: Continue antihypertensives  History of stroke: Continue Eliquis   Family Communication/Anticipated D/C date and plan/Code Status   DVT prophylaxis: Eliquis Code Status: Full code Family Communication: Plan discussed with her sister at the bedside Disposition Plan: Possible discharge to home in 2 days     Subjective:   She has no complaints.  No shortness of breath or chest pain.  Her sister was at the bedside and she said she has been a little  confused.  Objective:    Vitals:   02/04/19 1300 02/04/19 1436 02/04/19 1500 02/04/19 1618  BP: 139/81 133/80 134/79 129/79  Pulse: 86   86  Resp: 18 (!) 21 20 14   Temp:    97.9 F (36.6 C)  TempSrc:    Oral  SpO2: 95% 98% 96% 91%  Weight:      Height:        Intake/Output Summary (Last 24 hours) at 02/04/2019 1723 Last data filed at 02/04/2019 0300 Gross per 24 hour  Intake --  Output 300 ml  Net -300 ml   Filed Weights   02/02/19 1900 02/04/19 0347  Weight: 59.9 kg 57.4 kg    Exam:  GEN: NAD SKIN: No rash EYES: Anicteric ENT: MMM CV: RRR PULM: CTA B ABD: soft, ND, NT, +BS CNS: Alert, confused, non focal EXT: No edema or tenderness   Data Reviewed:   I have personally reviewed following labs and imaging studies:  Labs: Labs show the following:   Basic Metabolic Panel: Recent Labs  Lab 01/30/19 0823 02/02/19 1929 02/03/19 0454 02/04/19 0446  NA 138 135 137 141  K 4.2 3.9 3.8 4.0  CL 105 104 105 108  CO2 22 18* 19* 20*  GLUCOSE 232* 238* 220* 212*  BUN 15 17 16 21   CREATININE 1.39* 1.24* 1.24* 1.40*  CALCIUM 9.0 9.0 8.8* 9.4   GFR Estimated Creatinine Clearance: 34.4 mL/min (A) (by C-G formula based on SCr of 1.4 mg/dL (H)). Liver Function Tests: Recent Labs  Lab 01/30/19 0823 02/02/19 1929  AST 123* 130*  ALT 113* 92*  ALKPHOS 1,627* 1,819*  BILITOT 0.6  1.0  PROT 8.0 7.4  ALBUMIN 3.3* 3.2*   No results for input(s): LIPASE, AMYLASE in the last 168 hours. Recent Labs  Lab 02/03/19 1057  AMMONIA 19   Coagulation profile Recent Labs  Lab 02/02/19 1929  INR 1.6*    CBC: Recent Labs  Lab 01/30/19 0823 02/02/19 1929 02/03/19 0454 02/04/19 0446  WBC 8.8 10.8* 9.8 7.5  NEUTROABS 5.4 7.8*  --   --   HGB 10.0* 9.6* 9.7* 10.1*  HCT 32.9* 28.8* 28.8* 30.0*  MCV 74.4* 67.4* 67.4* 67.9*  PLT 304 374 376 407*   Cardiac Enzymes: No results for input(s): CKTOTAL, CKMB, CKMBINDEX, TROPONINI in the last 168 hours. BNP (last 3  results) No results for input(s): PROBNP in the last 8760 hours. CBG: Recent Labs  Lab 02/03/19 1949 02/03/19 2321 02/04/19 0337 02/04/19 0752 02/04/19 1156  GLUCAP 289* 214* 191* 217* 285*   D-Dimer: No results for input(s): DDIMER in the last 72 hours. Hgb A1c: Recent Labs    02/03/19 0454  HGBA1C 8.9*   Lipid Profile: No results for input(s): CHOL, HDL, LDLCALC, TRIG, CHOLHDL, LDLDIRECT in the last 72 hours. Thyroid function studies: No results for input(s): TSH, T4TOTAL, T3FREE, THYROIDAB in the last 72 hours.  Invalid input(s): FREET3 Anemia work up: No results for input(s): VITAMINB12, FOLATE, FERRITIN, TIBC, IRON, RETICCTPCT in the last 72 hours. Sepsis Labs: Recent Labs  Lab 01/30/19 0823 02/02/19 1929 02/03/19 0454 02/03/19 1057 02/04/19 0446  WBC 8.8 10.8* 9.8  --  7.5  LATICACIDVEN  --   --   --  2.4*  --     Microbiology Recent Results (from the past 240 hour(s))  SARS CORONAVIRUS 2 (TAT 6-24 HRS) Nasopharyngeal Nasopharyngeal Swab     Status: None   Collection Time: 02/02/19  9:30 PM   Specimen: Nasopharyngeal Swab  Result Value Ref Range Status   SARS Coronavirus 2 NEGATIVE NEGATIVE Final    Comment: (NOTE) SARS-CoV-2 target nucleic acids are NOT DETECTED. The SARS-CoV-2 RNA is generally detectable in upper and lower respiratory specimens during the acute phase of infection. Negative results do not preclude SARS-CoV-2 infection, do not rule out co-infections with other pathogens, and should not be used as the sole basis for treatment or other patient management decisions. Negative results must be combined with clinical observations, patient history, and epidemiological information. The expected result is Negative. Fact Sheet for Patients: SugarRoll.be Fact Sheet for Healthcare Providers: https://www.woods-mathews.com/ This test is not yet approved or cleared by the [place] FDA and  has been  authorized for detection and/or diagnosis of SARS-CoV-2 by FDA under an Emergency Use Authorization (EUA). This EUA will remain  in effect (meaning this test can be used) for the duration of the COVID-19 declaration under Section 56 4(b)(1) of the Act, 21 U.S.C. section 360bbb-3(b)(1), unless the authorization is terminated or revoked sooner. Performed at [facility], [place] [address] [place],  [address]     Procedures and diagnostic studies:  CT ABDOMEN PELVIS WO CONTRAST  Result Date: 02/02/2019 CLINICAL DATA:  Abdominal distension. Recent diagnosis of pulmonary embolus. Multiple liver lesions post biopsy. EXAM: CT ABDOMEN AND PELVIS WITHOUT CONTRAST TECHNIQUE: Multidetector CT imaging of the abdomen and pelvis was performed following the standard protocol without IV contrast. COMPARISON:  Chest CTA earlier this day. Abdominal CT 2 weeks ago 01/20/2019 FINDINGS: Lower chest: Emphysema scattered subpleural nodules, assessed on chest CT earlier this day. Hepatobiliary: Heterogeneous liver parenchyma. Multiple liver lesions are better defined  on prior contrast-enhanced exam. Difficult to assess for change given differences in technique. Postcholecystectomy similar proximal biliary prominence to prior. Motion artifact limits detailed assessment. Pancreas: Motion obscured. Coarse parenchymal calcification in the pancreatic tail. Ductal dilatation in the tail measures up to 8 mm. Heterogeneous low-density in the pancreatic tail, series 2, image 16. Detailed parenchymal assessment is limited in the lack of contrast and motion. Spleen: Normal in size without focal abnormality. Adrenals/Urinary Tract: Bilateral adrenal thickening. No hydronephrosis. There is excreted IV contrast in both renal collecting systems from prior chest CTA. Excreted IV contrast in the urinary bladder. No bladder wall thickening. Stomach/Bowel: Bowel evaluation is limited in the absence of contrast and patient motion.  Also paucity of intra-abdominal fat. Stomach is nondistended. No small bowel obstruction or obvious inflammation. Normal appendix. Moderate stool burden throughout the colon. No colonic wall thickening or inflammation. Vascular/Lymphatic: Moderate aortic atherosclerosis. No aneurysm. Limited assessment for adenopathy in the absence of contrast and patient motion. Reproductive: Post hysterectomy per history. No evidence of adnexal mass. Other: Small amount of perihepatic free fluid which has slightly increased in size from prior exam. Fluid is slightly dense and likely represents combination of simple ascites and post biopsy hemorrhage. Small amount of blood dependently in the pelvis. Cannot assess for active extravasation in the absence of IV contrast, however volume of fluid would not suggest this. Musculoskeletal: Postsurgical change of the anterior abdominal wall. A focal bone abnormality. IMPRESSION: 1. Post recent liver biopsy. Small amount of high-density fluid adjacent to the liver and tracking in the pelvis likely post biopsy hemorrhage. Cannot assess for active extravasation in the absence of IV contrast, however volume of fluid does not suggest this. Multiple liver lesions are better characterized on prior contrast-enhanced exam. 2. Coarse parenchymal calcification in the pancreatic tail with heterogeneous low-density and distal pancreatic ductal dilatation. In the setting of multiple hepatic lesions, findings are concerning for pancreatic neoplasm, however not well-defined on this noncontrast exam. Aortic Atherosclerosis (ICD10-I70.0). Electronically Signed   By: Keith Rake M.D.   On: 02/02/2019 23:20   CT Head Wo Contrast  Result Date: 02/02/2019 CLINICAL DATA:  Altered mental status EXAM: CT HEAD WITHOUT CONTRAST TECHNIQUE: Contiguous axial images were obtained from the base of the skull through the vertex without intravenous contrast. COMPARISON:  12/31/2018 FINDINGS: Brain: Chronic atrophic  and ischemic changes are again seen and stable. No acute hemorrhage, acute infarction or space-occupying mass lesion are seen. Vascular: No hyperdense vessel or unexpected calcification. Skull: Normal. Negative for fracture or focal lesion. Sinuses/Orbits: No acute finding. Other: None. IMPRESSION: Chronic atrophic and ischemic changes similar to that seen on the prior exam. No acute abnormality is noted. Electronically Signed   By: Inez Catalina M.D.   On: 02/02/2019 20:31   CT Angio Chest PE W/Cm &/Or Wo Cm  Result Date: 02/02/2019 CLINICAL DATA:  Shortness of breath EXAM: CT ANGIOGRAPHY CHEST WITH CONTRAST TECHNIQUE: Multidetector CT imaging of the chest was performed using the standard protocol during bolus administration of intravenous contrast. Multiplanar CT image reconstructions and MIPs were obtained to evaluate the vascular anatomy. CONTRAST:  61mL OMNIPAQUE IOHEXOL 350 MG/ML SOLN COMPARISON:  01/20/2019 FINDINGS: Cardiovascular: Thoracic aorta demonstrates atherosclerotic calcifications without aneurysmal dilatation or dissection. The heart is at the upper limits of normal in size. The pulmonary artery is well visualized with a normal branching pattern. The previously seen pulmonary emboli are again identified bilaterally with some minimal improvement in the small central branches. No significant resolution of the more  peripheral emboli is noted. No new significant emboli are noted. Changes of right heart strain are again noted and stable. Mediastinum/Nodes: The esophagus as visualized is within normal limits. No hilar or mediastinal adenopathy is seen. The thoracic inlet is within normal limits. Lungs/Pleura: Diffuse emphysematous changes are noted throughout both lungs. Scattered subpleural nodular changes are seen without significant increase from the prior study. No focal confluent infiltrate is seen. No sizable effusion is noted. No pneumothorax seen. Upper Abdomen: Visualized upper abdomen  demonstrates mild perihepatic fluid stable from the prior exam. Stable hypodense lesions are noted throughout the liver consistent with the given clinical history of hepatic metastatic disease. Musculoskeletal: Degenerative changes of the thoracic spine are noted. No acute bony abnormality is seen. Review of the MIP images confirms the above findings. IMPRESSION: Slight improvement in pulmonary emboli bilaterally although the peripheral emboli appear stable. No new emboli are seen. Persistent right heart strain. Emphysematous changes without acute infiltrate. Multiple pulmonary nodules stable from the prior exam which may represent some metastatic disease Hepatic metastatic disease. Aortic Atherosclerosis (ICD10-I70.0) and Emphysema (ICD10-J43.9). Electronically Signed   By: Inez Catalina M.D.   On: 02/02/2019 20:39   DG Chest Portable 1 View  Result Date: 02/02/2019 CLINICAL DATA:  Shortness of breath, stomach pain EXAM: PORTABLE CHEST 1 VIEW COMPARISON:  01/23/2019 FINDINGS: The heart size and mediastinal contours are within normal limits. Emphysema. The visualized skeletal structures are unremarkable. IMPRESSION: Emphysema without acute abnormality of the lungs in AP portable projection. Electronically Signed   By: Eddie Candle M.D.   On: 02/02/2019 19:06   ECHOCARDIOGRAM COMPLETE  Result Date: 02/03/2019   ECHOCARDIOGRAM REPORT   Patient Name:   LESTIE DAKIN Date of Exam: 02/03/2019 Medical Rec #:  [id number]         Height:       66.0 in Accession #:    [id number]        Weight:       132.0 lb Date of Birth:  March 25, 1950         BSA:          1.68 m Patient Age:    68 years          BP:           145/86 mmHg Patient Gender: F                 HR:           95 bpm. Exam Location:  [facility] Procedure: 2D Echo, Cardiac Doppler and Color Doppler Indications:     I50.31 CHF-Acute Diastolic  History:         Patient has prior history of Echocardiogram examinations, most                  recent 01/20/2019. Risk  Factors:Hypertension and Diabetes.  Sonographer:     Charmayne Sheer RDCS (AE) Referring Phys:  [id number] Athena Masse Diagnosing Phys: Kathlyn Sacramento MD  Sonographer Comments: Suboptimal subcostal window. IMPRESSIONS  1. Left ventricular ejection fraction, by visual estimation, is 55 to 60%. The left ventricle has normal function. There is mildly increased left ventricular hypertrophy.  2. Left ventricular diastolic parameters are consistent with Grade I diastolic dysfunction (impaired relaxation).  3. Right ventricular pressure overload.  4. The left ventricle has no regional wall motion abnormalities.  5. Global right ventricle has mildly reduced systolic function.The right ventricular size is moderately enlarged. No increase in right ventricular wall thickness.  6.  Left atrial size was normal.  7. Right atrial size was moderately dilated.  8. The mitral valve is normal in structure. No evidence of mitral valve regurgitation. No evidence of mitral stenosis.  9. The tricuspid valve is normal in structure. Tricuspid valve regurgitation moderate. 10. The aortic valve is normal in structure. Aortic valve regurgitation is not visualized. Mild to moderate aortic valve sclerosis/calcification without any evidence of aortic stenosis. 11. The pulmonic valve was normal in structure. Pulmonic valve regurgitation is not visualized. 12. Moderately to severely elevated pulmonary artery systolic pressure. 13. The tricuspid regurgitant velocity is 4.02 m/s, and with an assumed right atrial pressure of 5 mmHg, the estimated right ventricular systolic pressure is moderately elevated at 69.6 mmHg. FINDINGS  Left Ventricle: Left ventricular ejection fraction, by visual estimation, is 55 to 60%. The left ventricle has normal function. The left ventricle has no regional wall motion abnormalities. There is mildly increased left ventricular hypertrophy. The interventricular septum is flattened in systole, consistent with right ventricular  pressure overload. Left ventricular diastolic parameters are consistent with Grade I diastolic dysfunction (impaired relaxation). Normal left atrial pressure. Right Ventricle: The right ventricular size is moderately enlarged. No increase in right ventricular wall thickness. Global RV systolic function is has mildly reduced systolic function. The tricuspid regurgitant velocity is 4.02 m/s, and with an assumed right atrial pressure of 5 mmHg, the estimated right ventricular systolic pressure is moderately elevated at 69.6 mmHg. Left Atrium: Left atrial size was normal in size. Right Atrium: Right atrial size was moderately dilated Pericardium: There is no evidence of pericardial effusion. Mitral Valve: The mitral valve is normal in structure. No evidence of mitral valve regurgitation. No evidence of mitral valve stenosis by observation. MV peak gradient, 4.4 mmHg. Tricuspid Valve: The tricuspid valve is normal in structure. Tricuspid valve regurgitation moderate. Aortic Valve: The aortic valve is normal in structure. Aortic valve regurgitation is not visualized. Mild to moderate aortic valve sclerosis/calcification is present, without any evidence of aortic stenosis. Aortic valve mean gradient measures 4.0 mmHg. Aortic valve peak gradient measures 6.9 mmHg. Aortic valve area, by VTI measures 2.09 cm. Pulmonic Valve: The pulmonic valve was normal in structure. Pulmonic valve regurgitation is not visualized. Pulmonic regurgitation is not visualized. Aorta: The aortic root, ascending aorta and aortic arch are all structurally normal, with no evidence of dilitation or obstruction. IAS/Shunts: No atrial level shunt detected by color flow Doppler. There is no evidence of a patent foramen ovale. No ventricular septal defect is seen or detected. There is no evidence of an atrial septal defect.  LEFT VENTRICLE PLAX 2D LVIDd:         3.70 cm  Diastology LVIDs:         2.41 cm  LV e' lateral:   6.64 cm/s LV PW:         0.78 cm   LV E/e' lateral: 7.4 LV IVS:        0.71 cm LVOT diam:     1.90 cm LV SV:         38 ml LV SV Index:   22.62 LVOT Area:     2.84 cm  RIGHT VENTRICLE RV Basal diam:  2.91 cm LEFT ATRIUM             Index       RIGHT ATRIUM           Index LA diam:        2.80 cm 1.67 cm/m  RA Area:  23.20 cm LA Vol (A2C):   31.5 ml 18.79 ml/m RA Volume:   86.80 ml  51.79 ml/m LA Vol (A4C):   27.0 ml 16.11 ml/m LA Biplane Vol: 29.8 ml 17.78 ml/m  AORTIC VALVE                   PULMONIC VALVE AV Area (Vmax):    1.97 cm    PV Vmax:       0.75 m/s AV Area (Vmean):   1.94 cm    PV Vmean:      54.900 cm/s AV Area (VTI):     2.09 cm    PV VTI:        0.146 m AV Vmax:           131.00 cm/s PV Peak grad:  2.3 mmHg AV Vmean:          86.200 cm/s PV Mean grad:  1.0 mmHg AV VTI:            0.209 m AV Peak Grad:      6.9 mmHg AV Mean Grad:      4.0 mmHg LVOT Vmax:         90.90 cm/s LVOT Vmean:        59.100 cm/s LVOT VTI:          0.154 m LVOT/AV VTI ratio: 0.74  AORTA Ao Root diam: 2.70 cm MITRAL VALVE                        TRICUSPID VALVE MV Area (PHT): 3.91 cm             TR Peak grad:   64.6 mmHg MV Peak grad:  4.4 mmHg             TR Vmax:        421.00 cm/s MV Mean grad:  2.0 mmHg MV Vmax:       1.05 m/s             SHUNTS MV Vmean:      64.9 cm/s            Systemic VTI:  0.15 m MV VTI:        0.14 m               Systemic Diam: 1.90 cm MV PHT:        56.26 msec MV Decel Time: 194 msec MV E velocity: 49.00 cm/s 103 cm/s MV A velocity: 97.00 cm/s 70.3 cm/s MV E/A ratio:  0.51       1.5  Kathlyn Sacramento MD Electronically signed by Kathlyn Sacramento MD Signature Date/Time: 02/03/2019/1:36:07 PM    Final     Medications:   . amLODipine  5 mg Oral Daily  . apixaban  5 mg Oral BID  . Chlorhexidine Gluconate Cloth  6 each Topical Daily  . cholecalciferol  1,000 Units Oral Daily  . ferrous sulfate  325 mg Oral QODAY  . gabapentin  800 mg Oral BID  . insulin aspart  0-9 Units Subcutaneous Q4H  . insulin glargine  25 Units  Subcutaneous QHS  . lisinopril  10 mg Oral Daily  . loratadine  10 mg Oral Daily  . metoprolol tartrate  12.5 mg Oral BID  . [START ON 02/05/2019] predniSONE  40 mg Oral Q breakfast  . rosuvastatin  20 mg Oral Daily  . sodium chloride flush  3 mL Intravenous Q12H   Continuous  Infusions: . sodium chloride       LOS: 2 days   Ilia Engelbert  [facility]   *Please refer to [email or web address], password [email or web address] to get updated schedule on who will round on this patient, as hospitalists switch teams weekly. If 7PM-7AM, please contact night-coverage at [email or web address], password [email or web address] for any overnight needs.  02/04/2019, 5:23 PM

## 2019-02-04 NOTE — Progress Notes (Signed)
START OFF PATHWAY REGIMEN - Other   [id number]:Carboplatin + Paclitaxel (2/80) weekly:   Administer weekly:     Paclitaxel      Carboplatin   **Always confirm dose/schedule in your pharmacy ordering system**  Patient Characteristics: Intent of Therapy: Non-Curative / Palliative Intent, Discussed with Patient 

## 2019-02-04 NOTE — TOC Benefit Eligibility Note (Signed)
Transition of Care [facility]) Benefit Eligibility Note    Patient Details  Name: Audrey Chan MRN: [id number] Date of Birth: [birth year]   Medication/Dose: Alveda Reasons 20mg  PO daily  Covered?: Yes  Tier: 3 Drug  Prescription Coverage Preferred Pharmacy: [facility] with Person/Company/Phone Number:: Trish with [employer] at [phone]  Co-Pay: $0 estimated cost for 90 day supply  Prior Approval: No  Deductible: (In Catastrophic phase with a low income subsidy.)  Additional Notes: Price quoted good through remainder of 2020.  Per rep, should remain same in 2021.    Dannette Barbara Phone Number: [phone] or [phone] 02/04/2019, 10:05 AM

## 2019-02-04 NOTE — Clinical Social Work Note (Addendum)
CSW submitted benefits check request for Eliquis 5 mg PO BID and Xarelto 20 mg PO daily.  Dayton Scrape, CSW [phone]  10:52 am: CSW acknowledges consult for heart failure screen. Patient is not fully oriented. Will continue to follow progress. She is active with [facility] for PT, OT, and RN.  Dayton Scrape, [place]

## 2019-02-04 NOTE — TOC Benefit Eligibility Note (Signed)
Transition of Care [facility]) Benefit Eligibility Note    Patient Details  Name: Audrey Chan MRN: [id number] Date of Birth: December 11, 1950   Medication/Dose: Eliquis 5mg  PO BID  Covered?: Yes  Tier: 3 Drug  Prescription Coverage Preferred Pharmacy: [facility] with Person/Company/Phone Number:: Trish with [employer] at [phone]  Co-Pay: $0 estimated cost for 90 day supply  Prior Approval: No  Deductible: (Patient in Catastrophic phase with low income subsidy.)    Dannette Barbara Phone Number: [phone] or [phone] 02/04/2019, 10:03 AM

## 2019-02-04 NOTE — Progress Notes (Signed)
Progress note for 12/15 signed.

## 2019-02-04 NOTE — Progress Notes (Addendum)
Patient alert but confused. No complaints of pain or shortness of breath. She is on 2 liters of oxygen. Tolerating diet. Using bedside commode with one assist. Family at bedside. Patient moving to room 118.

## 2019-02-04 NOTE — Progress Notes (Signed)
Audrey Chan   DOB:1951/02/10   [id number]    Subjective: Patient resting comfortably in the bed.  Denies any acute distress.  She is slightly confused.  Denies any pain.  Objective:  Vitals:   02/04/19 1300 02/04/19 1436  BP: 139/81 133/80  Pulse: 86   Resp: 18 (!) 21  Temp:    SpO2: 95% 98%     Intake/Output Summary (Last 24 hours) at 02/04/2019 1506 Last data filed at 02/04/2019 0300 Gross per 24 hour  Intake --  Output 300 ml  Net -300 ml    Physical Exam  Constitutional:  Frail-appearing cachectic female patient.  On.  Nasal cannula oxygen.  She is alert but confused; oriented times 1-2.  She is accompanied by sister.  HENT:  Head: Normocephalic and atraumatic.  Mouth/Throat: Oropharynx is clear and moist. No oropharyngeal exudate.  Eyes: Pupils are equal, round, and reactive to light.  Cardiovascular: Normal rate and regular rhythm.  Pulmonary/Chest: No respiratory distress. She has no wheezes.  Decreased air entry bilaterally.  Abdominal: Soft. Bowel sounds are normal. She exhibits no distension and no mass. There is no abdominal tenderness. There is no rebound and no guarding.  Musculoskeletal:        General: No tenderness or edema. Normal range of motion.     Cervical back: Normal range of motion and neck supple.  Neurological: She is alert.  Skin: Skin is warm.  Psychiatric: Affect normal.     Labs:  Lab Results  Component Value Date   WBC 7.5 02/04/2019   HGB 10.1 (L) 02/04/2019   HCT 30.0 (L) 02/04/2019   MCV 67.9 (L) 02/04/2019   PLT 407 (H) 02/04/2019   NEUTROABS 7.8 (H) 02/02/2019    Lab Results  Component Value Date   NA 141 02/04/2019   K 4.0 02/04/2019   CL 108 02/04/2019   CO2 20 (L) 02/04/2019    Studies:  CT ABDOMEN PELVIS WO CONTRAST  Result Date: 02/02/2019 CLINICAL DATA:  Abdominal distension. Recent diagnosis of pulmonary embolus. Multiple liver lesions post biopsy. EXAM: CT ABDOMEN AND PELVIS WITHOUT CONTRAST TECHNIQUE:  Multidetector CT imaging of the abdomen and pelvis was performed following the standard protocol without IV contrast. COMPARISON:  Chest CTA earlier this day. Abdominal CT 2 weeks ago 01/20/2019 FINDINGS: Lower chest: Emphysema scattered subpleural nodules, assessed on chest CT earlier this day. Hepatobiliary: Heterogeneous liver parenchyma. Multiple liver lesions are better defined on prior contrast-enhanced exam. Difficult to assess for change given differences in technique. Postcholecystectomy similar proximal biliary prominence to prior. Motion artifact limits detailed assessment. Pancreas: Motion obscured. Coarse parenchymal calcification in the pancreatic tail. Ductal dilatation in the tail measures up to 8 mm. Heterogeneous low-density in the pancreatic tail, series 2, image 16. Detailed parenchymal assessment is limited in the lack of contrast and motion. Spleen: Normal in size without focal abnormality. Adrenals/Urinary Tract: Bilateral adrenal thickening. No hydronephrosis. There is excreted IV contrast in both renal collecting systems from prior chest CTA. Excreted IV contrast in the urinary bladder. No bladder wall thickening. Stomach/Bowel: Bowel evaluation is limited in the absence of contrast and patient motion. Also paucity of intra-abdominal fat. Stomach is nondistended. No small bowel obstruction or obvious inflammation. Normal appendix. Moderate stool burden throughout the colon. No colonic wall thickening or inflammation. Vascular/Lymphatic: Moderate aortic atherosclerosis. No aneurysm. Limited assessment for adenopathy in the absence of contrast and patient motion. Reproductive: Post hysterectomy per history. No evidence of adnexal mass. Other: Small amount of perihepatic free  fluid which has slightly increased in size from prior exam. Fluid is slightly dense and likely represents combination of simple ascites and post biopsy hemorrhage. Small amount of blood dependently in the pelvis. Cannot  assess for active extravasation in the absence of IV contrast, however volume of fluid would not suggest this. Musculoskeletal: Postsurgical change of the anterior abdominal wall. A focal bone abnormality. IMPRESSION: 1. Post recent liver biopsy. Small amount of high-density fluid adjacent to the liver and tracking in the pelvis likely post biopsy hemorrhage. Cannot assess for active extravasation in the absence of IV contrast, however volume of fluid does not suggest this. Multiple liver lesions are better characterized on prior contrast-enhanced exam. 2. Coarse parenchymal calcification in the pancreatic tail with heterogeneous low-density and distal pancreatic ductal dilatation. In the setting of multiple hepatic lesions, findings are concerning for pancreatic neoplasm, however not well-defined on this noncontrast exam. Aortic Atherosclerosis (ICD10-I70.0). Electronically Signed   By: Keith Rake M.D.   On: 02/02/2019 23:20   CT Head Wo Contrast  Result Date: 02/02/2019 CLINICAL DATA:  Altered mental status EXAM: CT HEAD WITHOUT CONTRAST TECHNIQUE: Contiguous axial images were obtained from the base of the skull through the vertex without intravenous contrast. COMPARISON:  12/31/2018 FINDINGS: Brain: Chronic atrophic and ischemic changes are again seen and stable. No acute hemorrhage, acute infarction or space-occupying mass lesion are seen. Vascular: No hyperdense vessel or unexpected calcification. Skull: Normal. Negative for fracture or focal lesion. Sinuses/Orbits: No acute finding. Other: None. IMPRESSION: Chronic atrophic and ischemic changes similar to that seen on the prior exam. No acute abnormality is noted. Electronically Signed   By: Inez Catalina M.D.   On: 02/02/2019 20:31   CT Angio Chest PE W/Cm &/Or Wo Cm  Result Date: 02/02/2019 CLINICAL DATA:  Shortness of breath EXAM: CT ANGIOGRAPHY CHEST WITH CONTRAST TECHNIQUE: Multidetector CT imaging of the chest was performed using the  standard protocol during bolus administration of intravenous contrast. Multiplanar CT image reconstructions and MIPs were obtained to evaluate the vascular anatomy. CONTRAST:  4mL OMNIPAQUE IOHEXOL 350 MG/ML SOLN COMPARISON:  01/20/2019 FINDINGS: Cardiovascular: Thoracic aorta demonstrates atherosclerotic calcifications without aneurysmal dilatation or dissection. The heart is at the upper limits of normal in size. The pulmonary artery is well visualized with a normal branching pattern. The previously seen pulmonary emboli are again identified bilaterally with some minimal improvement in the small central branches. No significant resolution of the more peripheral emboli is noted. No new significant emboli are noted. Changes of right heart strain are again noted and stable. Mediastinum/Nodes: The esophagus as visualized is within normal limits. No hilar or mediastinal adenopathy is seen. The thoracic inlet is within normal limits. Lungs/Pleura: Diffuse emphysematous changes are noted throughout both lungs. Scattered subpleural nodular changes are seen without significant increase from the prior study. No focal confluent infiltrate is seen. No sizable effusion is noted. No pneumothorax seen. Upper Abdomen: Visualized upper abdomen demonstrates mild perihepatic fluid stable from the prior exam. Stable hypodense lesions are noted throughout the liver consistent with the given clinical history of hepatic metastatic disease. Musculoskeletal: Degenerative changes of the thoracic spine are noted. No acute bony abnormality is seen. Review of the MIP images confirms the above findings. IMPRESSION: Slight improvement in pulmonary emboli bilaterally although the peripheral emboli appear stable. No new emboli are seen. Persistent right heart strain. Emphysematous changes without acute infiltrate. Multiple pulmonary nodules stable from the prior exam which may represent some metastatic disease Hepatic metastatic disease. Aortic  Atherosclerosis (  ICD10-I70.0) and Emphysema (ICD10-J43.9). Electronically Signed   By: Inez Catalina M.D.   On: 02/02/2019 20:39   DG Chest Portable 1 View  Result Date: 02/02/2019 CLINICAL DATA:  Shortness of breath, stomach pain EXAM: PORTABLE CHEST 1 VIEW COMPARISON:  01/23/2019 FINDINGS: The heart size and mediastinal contours are within normal limits. Emphysema. The visualized skeletal structures are unremarkable. IMPRESSION: Emphysema without acute abnormality of the lungs in AP portable projection. Electronically Signed   By: Eddie Candle M.D.   On: 02/02/2019 19:06   ECHOCARDIOGRAM COMPLETE  Result Date: 02/03/2019   ECHOCARDIOGRAM REPORT   Patient Name:   Audrey Chan Date of Exam: 02/03/2019 Medical Rec #:  [id number]         Height:       66.0 in Accession #:    [id number]        Weight:       132.0 lb Date of Birth:  [birth year]         BSA:          1.68 m Patient Age:    68 years          BP:           145/86 mmHg Patient Gender: F                 HR:           95 bpm. Exam Location:  [facility] Procedure: 2D Echo, Cardiac Doppler and Color Doppler Indications:     I50.31 CHF-Acute Diastolic  History:         Patient has prior history of Echocardiogram examinations, most                  recent 01/20/2019. Risk Factors:Hypertension and Diabetes.  Sonographer:     Charmayne Sheer RDCS (AE) Referring Phys:  [id number] Athena Masse Diagnosing Phys: Kathlyn Sacramento MD  Sonographer Comments: Suboptimal subcostal window. IMPRESSIONS  1. Left ventricular ejection fraction, by visual estimation, is 55 to 60%. The left ventricle has normal function. There is mildly increased left ventricular hypertrophy.  2. Left ventricular diastolic parameters are consistent with Grade I diastolic dysfunction (impaired relaxation).  3. Right ventricular pressure overload.  4. The left ventricle has no regional wall motion abnormalities.  5. Global right ventricle has mildly reduced systolic function.The right ventricular size  is moderately enlarged. No increase in right ventricular wall thickness.  6. Left atrial size was normal.  7. Right atrial size was moderately dilated.  8. The mitral valve is normal in structure. No evidence of mitral valve regurgitation. No evidence of mitral stenosis.  9. The tricuspid valve is normal in structure. Tricuspid valve regurgitation moderate. 10. The aortic valve is normal in structure. Aortic valve regurgitation is not visualized. Mild to moderate aortic valve sclerosis/calcification without any evidence of aortic stenosis. 11. The pulmonic valve was normal in structure. Pulmonic valve regurgitation is not visualized. 12. Moderately to severely elevated pulmonary artery systolic pressure. 13. The tricuspid regurgitant velocity is 4.02 m/s, and with an assumed right atrial pressure of 5 mmHg, the estimated right ventricular systolic pressure is moderately elevated at 69.6 mmHg. FINDINGS  Left Ventricle: Left ventricular ejection fraction, by visual estimation, is 55 to 60%. The left ventricle has normal function. The left ventricle has no regional wall motion abnormalities. There is mildly increased left ventricular hypertrophy. The interventricular septum is flattened in systole, consistent with right ventricular pressure overload. Left ventricular diastolic parameters are consistent with  Grade I diastolic dysfunction (impaired relaxation). Normal left atrial pressure. Right Ventricle: The right ventricular size is moderately enlarged. No increase in right ventricular wall thickness. Global RV systolic function is has mildly reduced systolic function. The tricuspid regurgitant velocity is 4.02 m/s, and with an assumed right atrial pressure of 5 mmHg, the estimated right ventricular systolic pressure is moderately elevated at 69.6 mmHg. Left Atrium: Left atrial size was normal in size. Right Atrium: Right atrial size was moderately dilated Pericardium: There is no evidence of pericardial effusion.  Mitral Valve: The mitral valve is normal in structure. No evidence of mitral valve regurgitation. No evidence of mitral valve stenosis by observation. MV peak gradient, 4.4 mmHg. Tricuspid Valve: The tricuspid valve is normal in structure. Tricuspid valve regurgitation moderate. Aortic Valve: The aortic valve is normal in structure. Aortic valve regurgitation is not visualized. Mild to moderate aortic valve sclerosis/calcification is present, without any evidence of aortic stenosis. Aortic valve mean gradient measures 4.0 mmHg. Aortic valve peak gradient measures 6.9 mmHg. Aortic valve area, by VTI measures 2.09 cm. Pulmonic Valve: The pulmonic valve was normal in structure. Pulmonic valve regurgitation is not visualized. Pulmonic regurgitation is not visualized. Aorta: The aortic root, ascending aorta and aortic arch are all structurally normal, with no evidence of dilitation or obstruction. IAS/Shunts: No atrial level shunt detected by color flow Doppler. There is no evidence of a patent foramen ovale. No ventricular septal defect is seen or detected. There is no evidence of an atrial septal defect.  LEFT VENTRICLE PLAX 2D LVIDd:         3.70 cm  Diastology LVIDs:         2.41 cm  LV e' lateral:   6.64 cm/s LV PW:         0.78 cm  LV E/e' lateral: 7.4 LV IVS:        0.71 cm LVOT diam:     1.90 cm LV SV:         38 ml LV SV Index:   22.62 LVOT Area:     2.84 cm  RIGHT VENTRICLE RV Basal diam:  2.91 cm LEFT ATRIUM             Index       RIGHT ATRIUM           Index LA diam:        2.80 cm 1.67 cm/m  RA Area:     23.20 cm LA Vol (A2C):   31.5 ml 18.79 ml/m RA Volume:   86.80 ml  51.79 ml/m LA Vol (A4C):   27.0 ml 16.11 ml/m LA Biplane Vol: 29.8 ml 17.78 ml/m  AORTIC VALVE                   PULMONIC VALVE AV Area (Vmax):    1.97 cm    PV Vmax:       0.75 m/s AV Area (Vmean):   1.94 cm    PV Vmean:      54.900 cm/s AV Area (VTI):     2.09 cm    PV VTI:        0.146 m AV Vmax:           131.00 cm/s PV Peak  grad:  2.3 mmHg AV Vmean:          86.200 cm/s PV Mean grad:  1.0 mmHg AV VTI:            0.209 m AV Peak  Grad:      6.9 mmHg AV Mean Grad:      4.0 mmHg LVOT Vmax:         90.90 cm/s LVOT Vmean:        59.100 cm/s LVOT VTI:          0.154 m LVOT/AV VTI ratio: 0.74  AORTA Ao Root diam: 2.70 cm MITRAL VALVE                        TRICUSPID VALVE MV Area (PHT): 3.91 cm             TR Peak grad:   64.6 mmHg MV Peak grad:  4.4 mmHg             TR Vmax:        421.00 cm/s MV Mean grad:  2.0 mmHg MV Vmax:       1.05 m/s             SHUNTS MV Vmean:      64.9 cm/s            Systemic VTI:  0.15 m MV VTI:        0.14 m               Systemic Diam: 1.90 cm MV PHT:        56.26 msec MV Decel Time: 194 msec MV E velocity: 49.00 cm/s 103 cm/s MV A velocity: 97.00 cm/s 70.3 cm/s MV E/A ratio:  0.51       1.5  Kathlyn Sacramento MD Electronically signed by Kathlyn Sacramento MD Signature Date/Time: 02/03/2019/1:36:07 PM    Final     Liver metastases [facility]) # 68 year old female patient with a 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  Primary unclear; CEA/CA 27-29/ca-15-3-elevated; CA 19-9 normal. Awaiting outpatient PET scan; NGS testing results.  See discussion below  # Bilateral PE-right heart strain-repeat CTA shows no progressive PE; but does show residual blood clots; 2D echo suggestive of right heart strain- currently on anticoagulation/Eliquis.  Currently on 4 L of nasal cannula.  # Elevated LFTs-secondary to underlying malignancy.   # CKD- stage III-stable.  Recommendations/Plan:   # I had a long discussion the patient/her sister [name] regarding her symptomatology likely related to underlying malignancy.  Unfortunately, symptoms seems to be rapidly progressive-concerning for aggressive malignancy.  However further work-up including PET  scan; NGS pending.  #Given the progressive symptomatology-concerning for underlying aggressive malignancy; although unclear primary.  Discussed if interested in pursuing treatment-recommend starting chemotherapy prior to discharge.  I would recommend carbotaxol-weekly dose-for unknown primary adenocarcinoma.   #Had a long discussion the patient/sister/nephew-regarding high risk of decompensation from chemotherapy-given multiorgan dysfunction-respiratory failure/liver failure/renal failure/borderline performance status.  Discussed the potential side effects of chemotherapy-including risk of infection; need for blood transfusion; renal failure; liver failure etc; and potential death from chemotherapy.  However given the absence of any other alternative options at this time patient/family interested in proceeding with treatment once clinically stable.  We will try to arrange for chemotherapy inpatient/prior to discharge in 1 to 2 days once clinically stable.   #Also discussed CODE STATUS-with the patient's sister.  Await palliative care evaluation/recommendations.  Discussed with [employer].  #I also spoke to patient's PCP Ms. Raquel Sarna, nurse practitioner.    [name], MD 02/04/2019  3:06 PM

## 2019-02-05 ENCOUNTER — Telehealth: Payer: Self-pay | Admitting: Internal Medicine

## 2019-02-05 ENCOUNTER — Encounter: Payer: Self-pay | Admitting: Internal Medicine

## 2019-02-05 ENCOUNTER — Other Ambulatory Visit: Payer: Self-pay | Admitting: Internal Medicine

## 2019-02-05 LAB — BASIC METABOLIC PANEL
Anion gap: 12 (ref 5–15)
BUN: 29 mg/dL — ABNORMAL HIGH (ref 8–23)
CO2: 18 mmol/L — ABNORMAL LOW (ref 22–32)
Calcium: 8.7 mg/dL — ABNORMAL LOW (ref 8.9–10.3)
Chloride: 103 mmol/L (ref 98–111)
Creatinine, Ser: 1.71 mg/dL — ABNORMAL HIGH (ref 0.44–1.00)
GFR calc Af Amer: 35 mL/min — ABNORMAL LOW (ref >60)
GFR calc non Af Amer: 30 mL/min — ABNORMAL LOW (ref >60)
Glucose, Bld: 556 mg/dL (ref 70–99)
Potassium: 5.4 mmol/L — ABNORMAL HIGH (ref 3.5–5.1)
Sodium: 133 mmol/L — ABNORMAL LOW (ref 135–145)

## 2019-02-05 LAB — GLUCOSE, CAPILLARY
Glucose-Capillary: 108 mg/dL — ABNORMAL HIGH (ref 70–99)
Glucose-Capillary: 145 mg/dL — ABNORMAL HIGH (ref 70–99)
Glucose-Capillary: 39 mg/dL — CL (ref 70–99)
Glucose-Capillary: 226 mg/dL — ABNORMAL HIGH (ref 70–99)
Glucose-Capillary: 396 mg/dL — ABNORMAL HIGH (ref 70–99)
Glucose-Capillary: 516 mg/dL (ref 70–99)

## 2019-02-05 LAB — URINALYSIS, COMPLETE (UACMP) WITH MICROSCOPIC
Bacteria, UA: NONE SEEN
Bilirubin Urine: NEGATIVE
Glucose, UA: 150 mg/dL — AB
Hgb urine dipstick: NEGATIVE
Ketones, ur: NEGATIVE mg/dL
Leukocytes,Ua: NEGATIVE
Nitrite: NEGATIVE
Protein, ur: 100 mg/dL — AB
Specific Gravity, Urine: 1.019 (ref 1.005–1.030)
pH: 5 (ref 5.0–8.0)

## 2019-02-05 LAB — BASIC METABOLIC PANEL WITH GFR
Anion gap: 8 (ref 5–15)
BUN: 26 mg/dL — ABNORMAL HIGH (ref 8–23)
CO2: 24 mmol/L (ref 22–32)
Calcium: 9.2 mg/dL (ref 8.9–10.3)
Chloride: 108 mmol/L (ref 98–111)
Creatinine, Ser: 1.43 mg/dL — ABNORMAL HIGH (ref 0.44–1.00)
GFR calc Af Amer: 43 mL/min — ABNORMAL LOW
GFR calc non Af Amer: 37 mL/min — ABNORMAL LOW
Glucose, Bld: 67 mg/dL — ABNORMAL LOW (ref 70–99)
Potassium: 4.2 mmol/L (ref 3.5–5.1)
Sodium: 140 mmol/L (ref 135–145)

## 2019-02-05 MED ORDER — FAMOTIDINE IN NACL 20-0.9 MG/50ML-% IV SOLN
20.0000 mg | Freq: Once | INTRAVENOUS | Status: AC
  Administered 2019-02-05: 20 mg via INTRAVENOUS
  Filled 2019-02-05: qty 50

## 2019-02-05 MED ORDER — PALONOSETRON HCL INJECTION 0.25 MG/5ML
0.2500 mg | Freq: Once | INTRAVENOUS | Status: AC
  Administered 2019-02-05: 13:00:00 0.25 mg via INTRAVENOUS
  Filled 2019-02-05 (×2): qty 5

## 2019-02-05 MED ORDER — DEXTROSE 50 % IV SOLN: 1.0000 | Freq: Once | INTRAVENOUS | Status: AC

## 2019-02-05 MED ORDER — INSULIN ASPART 100 UNIT/ML ~~LOC~~ SOLN
13.0000 [IU] | Freq: Once | SUBCUTANEOUS | Status: AC
  Administered 2019-02-05: 13 [IU] via SUBCUTANEOUS
  Filled 2019-02-05: qty 1

## 2019-02-05 MED ORDER — SODIUM CHLORIDE 0.9 % IV SOLN
10.0000 mg | Freq: Once | INTRAVENOUS | Status: AC
  Administered 2019-02-05: 10 mg via INTRAVENOUS
  Filled 2019-02-05: qty 1

## 2019-02-05 MED ORDER — INSULIN GLARGINE 100 UNIT/ML ~~LOC~~ SOLN
15.0000 [IU] | Freq: Every day | SUBCUTANEOUS | Status: DC
  Administered 2019-02-05: 15 [IU] via SUBCUTANEOUS
  Filled 2019-02-05 (×2): qty 0.15

## 2019-02-05 MED ORDER — DIPHENHYDRAMINE HCL 50 MG/ML IJ SOLN
50.0000 mg | Freq: Once | INTRAMUSCULAR | Status: AC
  Administered 2019-02-05: 13:00:00 50 mg via INTRAVENOUS
  Filled 2019-02-05: qty 1

## 2019-02-05 MED ORDER — DEXTROSE 50 % IV SOLN
INTRAVENOUS | Status: AC
  Administered 2019-02-05: 50 mL via INTRAVENOUS
  Filled 2019-02-05: qty 50

## 2019-02-05 MED ORDER — SODIUM CHLORIDE 0.9 % IV SOLN
80.0000 mg/m2 | Freq: Once | INTRAVENOUS | Status: AC
  Administered 2019-02-05: 132 mg via INTRAVENOUS
  Filled 2019-02-05: qty 22

## 2019-02-05 MED ORDER — SODIUM CHLORIDE 0.9 % IV SOLN: 10.0000 mg | Freq: Once | INTRAVENOUS | Status: DC

## 2019-02-05 MED ORDER — SODIUM CHLORIDE 0.9 % IV SOLN
117.2000 mg | Freq: Once | INTRAVENOUS | Status: AC
  Administered 2019-02-05: 120 mg via INTRAVENOUS
  Filled 2019-02-05: qty 12

## 2019-02-05 NOTE — Progress Notes (Signed)
Patient currently resting and when awaken complains of no pain, or discomfort. Will continue to monitor patient to the end of shift.

## 2019-02-05 NOTE — Progress Notes (Signed)
Audrey Chan   DOB:[birth year]   [id number]    Subjective: Patient resting comfortably in the bed.  Denies any pain.  Had a restful night.  As per the nurse patient had episode of hypoglycemia for which she needed D50.  Objective:  Vitals:   02/04/19 1919 02/05/19 0341  BP: 127/81 120/82  Pulse: 92 74  Resp: 18 17  Temp: 98 F (36.7 C) (!) 97.1 F (36.2 C)  SpO2: 99% 95%     Intake/Output Summary (Last 24 hours) at 02/05/2019 [id number] Last data filed at 02/04/2019 1743 Gross per 24 hour  Intake 199 ml  Output --  Net 199 ml    Physical Exam  Constitutional: She is oriented to person, place, and time.  Cachectic female patient.  Appears weak. in no acute distress.  Alone.  2 L of nasal cannula.  HENT:  Head: Normocephalic and atraumatic.  Mouth/Throat: Oropharynx is clear and moist. No oropharyngeal exudate.  Eyes: Pupils are equal, round, and reactive to light.  Cardiovascular: Normal rate and regular rhythm.  Pulmonary/Chest: No respiratory distress. She has no wheezes.  Decreased air entry bilaterally.  Abdominal: Soft. Bowel sounds are normal. She exhibits no distension and no mass. There is no abdominal tenderness. There is no rebound and no guarding.  Positive hepatomegaly.  Musculoskeletal:        General: No tenderness or edema. Normal range of motion.     Cervical back: Normal range of motion and neck supple.  Neurological: She is alert and oriented to person, place, and time.  Skin: Skin is warm.  Psychiatric: Affect normal.     Labs:  Lab Results  Component Value Date   WBC 7.5 02/04/2019   HGB 10.1 (L) 02/04/2019   HCT 30.0 (L) 02/04/2019   MCV 67.9 (L) 02/04/2019   PLT 407 (H) 02/04/2019   NEUTROABS 7.8 (H) 02/02/2019    Lab Results  Component Value Date   NA 140 02/05/2019   K 4.2 02/05/2019   CL 108 02/05/2019   CO2 24 02/05/2019    Studies:  No results found.  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  Carcinoma of unknown primary-  CEA/CA 27-29/ca-15-3-elevated; CA 19-9 normal. Awaiting outpatient PET scan; NGS testing results.  See discussion below  # Bilateral PE-right heart strain-repeat CTA shows no progressive PE; but does show residual blood clots; 2D echo suggestive of right heart strain- currently on anticoagulation/Eliquis. STABLE; Currently on 2 L of nasal cannula.  # CKD- stage III -STABLE.   # Episodes of hypoglycemia- s/p D-50; monitor closely. Defer to primary team.   Recommendations/Plan:   #Recommend proceeding with chemotherapy with-weekly carbotaxol-diagnosis carcinoma of unknown primary.  Discussed with patient.  Patient aware of potential side effects of chemotherapy-including but not limited to nausea vomiting renal dysfunction hepatic dysfunction risk of infections including death. She is in agreement.  I discussed plan to proceed with chemotherapy with the patient sister yesterday.  This morning I had to a leave a message for her.    #Spoke to patient's nurse Danielle/also spoke to chemo therapy nurse, Freddie Breech. Will discuss with cancer center pharmacy staff.     [name], MD 02/05/2019  8:39 AM

## 2019-02-05 NOTE — Progress Notes (Signed)
Patient has UA ordered that no one has collected, and patient had no urine output all night.  Bladder scan shows 716 cc in bladder.  Dr. Izetta Dakin gave verbal order to in and out cath.  If patient doesn't void after that and bladder scan greater than 500 will place foley.  Clarise Cruz, BSN

## 2019-02-05 NOTE — Telephone Encounter (Signed)
I spoke to patient's sister- [phone] ; discussed about the plan for chemotherapy today.  In agreement.  States that she cannot be available today in the hospital because of bad weather.  Discussed that hospitalist team will decide on patient discharge/if clinically stable potentially discharge tomorrow.

## 2019-02-05 NOTE — Plan of Care (Signed)
  Problem: Clinical Measurements: Goal: Ability to maintain clinical measurements within normal limits will improve Outcome: Progressing   Problem: Activity: Goal: Risk for activity intolerance will decrease Outcome: Progressing   Problem: Coping: Goal: Level of anxiety will decrease Outcome: Progressing   

## 2019-02-05 NOTE — Progress Notes (Signed)
Pt received Chemotherpy today of taxol and carboplatin with premedsgiven. Tol well. Continues with periods of confusion.excellent  Blood return noted  At  Iv site rt upper arm number 20g.Chemo precautions in place.

## 2019-02-05 NOTE — Progress Notes (Signed)
Progress Note    Audrey Chan  [id number] DOB: [birth year]  DOA: 02/02/2019 PCP: Theotis Burrow, MD     Assessment/Plan:   Principal Problem:   Acute on chronic respiratory failure with hypoxia ([place]) Active Problems:   Hypertension   Insulin dependent type 2 diabetes mellitus ([place])   Acute metabolic encephalopathy   Liver metastases (HCC)   CKD (chronic kidney disease), stage III   History of pulmonary embolism   History of CVA (cerebrovascular accident) without residual deficits   Pulmonary embolism (HCC)   Cor pulmonale, acute ([place])   COPD with acute bronchitis (HCC)   Elevated troponin   Goals of care, counseling/discussion   Palliative care encounter   Body mass index is 20.42 kg/m.   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 Rogue Bussing.  -Patient is to receive systemic chemotherapy on 02/05/2019 in hospital -Appreciate oncology's care  CKD stage III: Creatinine fluctuating -Continue IV hydration  COPD with probable acute exacerbation: Currently stable  -Status post IV Rocephin.  Continue bronchodilators.   -Continue oral prednisone  Elevated liver enzymes: Probably from metastatic liver disease -Monitor  Type 2 diabetes mellitus with hyperglycemia: Fluctuating -Was hypoglycemic this morning down to 30sx2.  Was given D50 -Repeat blood glucose in 100-200 range Hemoglobin A1c was 9.5 on 01/20/2019.  - Continue Lantus with reduced dose.   - NovoLog as needed for hyperglycemia every 4 hours Accu-Chek  Hypertension: Continue antihypertensives  History of stroke: Continue Eliquis  Urinary  retention: Nurse this morning patient complained of not urinating for long time.  Bladder scan showed 700+ urine -Data's post straight cath -We will redo bladder scan if continues to retain, may insert Foley catheter -May obtain GU ultrasound -Urine output   Family Communication/Anticipated D/C date and plan/Code Status   DVT prophylaxis: Eliquis Code Status: Full code Family Communication: Plan discussed with her sister at the bedside Disposition Plan: Possible discharge to home in 2 days  Subjective:  Patient was on bed.  Patient appears tired.  Awaiting chemotherapy this morning.  Denies any fever or worsening shortness of breath.  Objective:    Vitals:   02/05/19 0341 02/05/19 0905 02/05/19 1241 02/05/19 1441  BP: 120/82 132/90 131/73 110/72  Pulse: 74 83 75 79  Resp: 17 14 18 18   Temp: (!) 97.1 F (36.2 C) 98 F (36.7 C) 98.3 F (36.8 C) 98.3 F (36.8 C)  TempSrc: Axillary Oral Oral Oral  SpO2: 95% 96% 94% 93%  Weight:      Height:        Intake/Output Summary (Last 24 hours) at 02/05/2019 1530 Last data filed at 02/05/2019 1410 Gross per 24 hour  Intake 279 ml  Output 600 ml  Net -321 ml   Filed Weights   02/02/19 1900 02/04/19 0347  Weight: 59.9 kg 57.4 kg    Exam:  GEN: NAD SKIN: No rash EYES: Anicteric ENT: MMM CV: RRR PULM: CTA B ABD: soft, ND, NT, +BS CNS: Alert, confused, non focal EXT: No edema or tenderness   Data Reviewed:   I have personally reviewed following labs and imaging studies:  Labs: Labs show the following:   Basic Metabolic Panel: Recent  Labs  Lab 01/30/19 0823 02/02/19 1929 02/03/19 0454 02/04/19 0446 02/05/19 0612  NA 138 135 137 141 140  K 4.2 3.9 3.8 4.0 4.2  CL 105 104 105 108 108  CO2 22 18* 19* 20* 24  GLUCOSE 232* 238* 220* 212* 67*  BUN 15 17 16 21  26*  CREATININE 1.39* 1.24* 1.24* 1.40* 1.43*  CALCIUM 9.0 9.0 8.8* 9.4 9.2   GFR Estimated Creatinine Clearance: 33.6 mL/min (A) (by C-G formula  based on SCr of 1.43 mg/dL (H)). Liver Function Tests: Recent Labs  Lab 01/30/19 0823 02/02/19 1929  AST 123* 130*  ALT 113* 92*  ALKPHOS 1,627* 1,819*  BILITOT 0.6 1.0  PROT 8.0 7.4  ALBUMIN 3.3* 3.2*   No results for input(s): LIPASE, AMYLASE in the last 168 hours. Recent Labs  Lab 02/03/19 1057  AMMONIA 19   Coagulation profile Recent Labs  Lab 02/02/19 1929  INR 1.6*    CBC: Recent Labs  Lab 01/30/19 0823 02/02/19 1929 02/03/19 0454 02/04/19 0446  WBC 8.8 10.8* 9.8 7.5  NEUTROABS 5.4 7.8*  --   --   HGB 10.0* 9.6* 9.7* 10.1*  HCT 32.9* 28.8* 28.8* 30.0*  MCV 74.4* 67.4* 67.4* 67.9*  PLT 304 374 376 407*   Cardiac Enzymes: No results for input(s): CKTOTAL, CKMB, CKMBINDEX, TROPONINI in the last 168 hours. BNP (last 3 results) No results for input(s): PROBNP in the last 8760 hours. CBG: Recent Labs  Lab 02/05/19 0342 02/05/19 0740 02/05/19 0743 02/05/19 0801 02/05/19 1128  GLUCAP 108* 35* 39* 145* 226*   D-Dimer: No results for input(s): DDIMER in the last 72 hours. Hgb A1c: Recent Labs    02/03/19 0454  HGBA1C 8.9*   Lipid Profile: No results for input(s): CHOL, HDL, LDLCALC, TRIG, CHOLHDL, LDLDIRECT in the last 72 hours. Thyroid function studies: No results for input(s): TSH, T4TOTAL, T3FREE, THYROIDAB in the last 72 hours.  Invalid input(s): FREET3 Anemia work up: No results for input(s): VITAMINB12, FOLATE, FERRITIN, TIBC, IRON, RETICCTPCT in the last 72 hours. Sepsis Labs: Recent Labs  Lab 01/30/19 0823 02/02/19 1929 02/03/19 0454 02/03/19 1057 02/04/19 0446  WBC 8.8 10.8* 9.8  --  7.5  LATICACIDVEN  --   --   --  2.4*  --     Microbiology Recent Results (from the past 240 hour(s))  SARS CORONAVIRUS 2 (TAT 6-24 HRS) Nasopharyngeal Nasopharyngeal Swab     Status: None   Collection Time: 02/02/19  9:30 PM   Specimen: Nasopharyngeal Swab  Result Value Ref Range Status   SARS Coronavirus 2 NEGATIVE NEGATIVE Final    Comment:  (NOTE) SARS-CoV-2 target nucleic acids are NOT DETECTED. The SARS-CoV-2 RNA is generally detectable in upper and lower respiratory specimens during the acute phase of infection. Negative results do not preclude SARS-CoV-2 infection, do not rule out co-infections with other pathogens, and should not be used as the sole basis for treatment or other patient management decisions. Negative results must be combined with clinical observations, patient history, and epidemiological information. The expected result is Negative. Fact Sheet for Patients: SugarRoll.be Fact Sheet for Healthcare Providers: https://www.woods-mathews.com/ This test is not yet approved or cleared by the [place] FDA and  has been authorized for detection and/or diagnosis of SARS-CoV-2 by FDA under an Emergency Use Authorization (EUA). This EUA will remain  in effect (meaning this test can be used) for the duration of the COVID-19 declaration under Section 56 4(b)(1) of the Act, 21 U.S.C. section 360bbb-3(b)(1), unless the  authorization is terminated or revoked sooner. Performed at [facility], [place] [address], [place], [place] [address]     Procedures and diagnostic studies:  No results found.  Medications:   . amLODipine  5 mg Oral Daily  . apixaban  5 mg Oral BID  . CARBOplatin  120 mg Intravenous Once  . cholecalciferol  1,000 Units Oral Daily  . ferrous sulfate  325 mg Oral QODAY  . gabapentin  800 mg Oral BID  . insulin aspart  0-9 Units Subcutaneous Q4H  . insulin glargine  15 Units Subcutaneous QHS  . lisinopril  10 mg Oral Daily  . loratadine  10 mg Oral Daily  . metoprolol tartrate  12.5 mg Oral BID  . predniSONE  40 mg Oral Q breakfast  . rosuvastatin  20 mg Oral Daily  . sodium chloride flush  3 mL Intravenous Q12H   Continuous Infusions: . sodium chloride       LOS: 3 days   [place]  [facility]   *Please refer to  [email or web address], password [email or web address] to get updated schedule on who will round on this patient, as hospitalists switch teams weekly. If 7PM-7AM, please contact night-coverage at [email or web address], password [email or web address] for any overnight needs.  02/05/2019, 3:30 PM

## 2019-02-05 NOTE — Progress Notes (Signed)
[employer]  [phone] [phone]   Name: Audrey Chan Date: 02/05/2019 MRN: [id number]  DOB: 19-Feb-1951  Patient Care Team: Theotis Burrow, MD as PCP - General (Family Medicine) Flora Lipps, MD (Pulmonary Disease) [name], MD as Consulting Physician (Oncology)    REASON FOR CONSULTATION: Audrey Chan is a 68 y.o. female with multiple medical problems including COPD, chronic respiratory failure on home O2 at 2 L, history of CVA without residual deficits, CKD 3, who was recently hospitalized 01/22/2019-01/25/2019 for acute DVT/PE and underwent thrombectomy.  She was incidentally found to have hepatic metastases and underwent outpatient biopsy on 01/27/2019.  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  She is readmitted on 02/02/2019 with acute on chronic hypoxic respiratory failure with suspected cor pulmonale due to recent PE.  Palliative care was consulted to help address goals.   CODE STATUS: Full code  PAST MEDICAL HISTORY: Past Medical History:  Diagnosis Date  . Allergic rhinitis   . Aneurysm of unspecified site ([place])   . Cholelithiasis   . Diabetes type 2, controlled ([place])   . GERD (gastroesophageal reflux disease)   . Hypertension   . Leg DVT (deep venous thromboembolism), acute, bilateral ([place]) 01/21/2019  . Multiple subsegmental pulmonary emboli without acute cor pulmonale ([place]) 01/20/2019  . Neuropathy   . Veno-occlusive disease     PAST SURGICAL HISTORY:  Past Surgical History:  Procedure Laterality Date  . ABDOMINAL HYSTERECTOMY  1972  . CERVICAL FUSION    . CHOLECYSTECTOMY N/A 12/28/2014   Procedure: LAPAROSCOPIC CHOLECYSTECTOMY;  Surgeon: Sherri Rad, MD;  Location: [facility] ORS;  Service: General;  Laterality: N/A;  . EYE SURGERY     detached retina  . MASS EXCISION Left 05/06/2018   Procedure: EXCISION OF LEFT ANTECUBITAL CYST,  DIABETIC;  Surgeon: Vickie Epley, MD;  Location: [facility] ORS;  Service: Vascular;  Laterality: Left;  . PULMONARY THROMBECTOMY Bilateral 01/22/2019   Procedure: PULMONARY THROMBECTOMY;  Surgeon: Algernon Huxley, MD;  Location: [facility];  Service: Cardiovascular;  Laterality: Bilateral;    HEMATOLOGY/ONCOLOGY HISTORY:  Oncology History Overview Note  # DEC 2020- CT scan- MULTIPLE LIVER LESIONS s/p Bx- HIGH GRADE ADENO CA [not melanoma/or vascular tumor]; CEA- 396; CA 15-3-56/CA 27-29-150;CA-19-9-N;   # Bil PE/right heart strain [s/p thrombectomy; Dr.Dew/Dr.Aleskerov; no lysis- sec to recent stroke]; on eliquis  # COPD/  # NGS/MOLECULAR TESTS:p  # PALLIATIVE CARE EVALUATION:p  # PAIN MANAGEMENT: p   DIAGNOSIS:   STAGE:         ;  GOALS:  CURRENT/MOST RECENT THERAPY :     Liver metastases ([place])   Initial Diagnosis   Liver metastases ([place])   02/05/2019 -  Chemotherapy   The patient had palonosetron (ALOXI) injection 0.25 mg, 0.25 mg, Intravenous,  Once, 1 of 3 cycles CARBOplatin (PARAPLATIN) 120 mg in sodium chloride 0.9 % 100 mL chemo infusion, 120 mg (100 % of original dose 117.2 mg), Intravenous,  Once, 1 of 3 cycles Dose modification:   (original dose 117.2 mg, Cycle 1) PACLitaxel (TAXOL) 132 mg in sodium chloride 0.9 % 250 mL chemo infusion (</= 80mg /m2), 80 mg/m2 = 132 mg, Intravenous,  Once, 1 of 3 cycles  for chemotherapy treatment.    Carcinoma of unknown primary ([place])  02/04/2019 Initial Diagnosis   Carcinoma of unknown primary ([place])   02/05/2019 -  Chemotherapy   The patient had palonosetron (ALOXI) injection 0.25 mg,  0.25 mg, Intravenous,  Once, 1 of 3 cycles CARBOplatin (PARAPLATIN) 120 mg in sodium chloride 0.9 % 100 mL chemo infusion, 120 mg (100 % of original dose 117.2 mg), Intravenous,  Once, 1 of 3 cycles Dose modification:   (original dose 117.2 mg, Cycle 1) PACLitaxel (TAXOL) 132 mg in sodium chloride 0.9 % 250 mL chemo infusion (</= 80mg /m2),  80 mg/m2 = 132 mg, Intravenous,  Once, 1 of 3 cycles  for chemotherapy treatment.      ALLERGIES:  has No Known Allergies.  MEDICATIONS:  Current Facility-Administered Medications  Medication Dose Route Frequency Provider Last Rate Last Admin  . 0.9 %  sodium chloride infusion  250 mL Intravenous PRN Jennye Boroughs, MD      . acetaminophen (TYLENOL) tablet 500-1,000 mg  500-1,000 mg Oral Q6H PRN Jennye Boroughs, MD      . amLODipine (NORVASC) tablet 5 mg  5 mg Oral Daily Jennye Boroughs, MD   5 mg at 02/05/19 0946  . apixaban (ELIQUIS) tablet 5 mg  5 mg Oral BID Jennye Boroughs, MD   5 mg at 02/05/19 0945  . CARBOplatin (PARAPLATIN) 120 mg in sodium chloride 0.9 % 100 mL chemo infusion  120 mg Intravenous Once Charlaine Dalton R, MD 224 mL/hr at 02/05/19 1626 120 mg at 02/05/19 1626  . cholecalciferol (VITAMIN D3) tablet 1,000 Units  1,000 Units Oral Daily Jennye Boroughs, MD   1,000 Units at 02/05/19 0945  . ferrous sulfate tablet 325 mg  325 mg Oral Tonye Pearson, MD   325 mg at 02/04/19 0904  . gabapentin (NEURONTIN) capsule 800 mg  800 mg Oral BID Jennye Boroughs, MD   800 mg at 02/05/19 0945  . insulin aspart (novoLOG) injection 0-9 Units  0-9 Units Subcutaneous Q4H Jennye Boroughs, MD   3 Units at 02/05/19 1246  . insulin glargine (LANTUS) injection 15 Units  15 Units Subcutaneous QHS Thornell Mule, MD      . ipratropium-albuterol (DUONEB) 0.5-2.5 (3) MG/3ML nebulizer solution 3 mL  3 mL Nebulization Q6H PRN Jennye Boroughs, MD      . lisinopril (ZESTRIL) tablet 10 mg  10 mg Oral Daily Jennye Boroughs, MD   10 mg at 02/05/19 0946  . loratadine (CLARITIN) tablet 10 mg  10 mg Oral Daily Jennye Boroughs, MD   10 mg at 02/05/19 0947  . metoprolol tartrate (LOPRESSOR) tablet 12.5 mg  12.5 mg Oral BID Jennye Boroughs, MD   12.5 mg at 02/05/19 0947  . polyethylene glycol (MIRALAX / GLYCOLAX) packet 17 g  17 g Oral Daily PRN Jennye Boroughs, MD      . predniSONE (DELTASONE) tablet 40 mg  40 mg  Oral Q breakfast Jennye Boroughs, MD   40 mg at 02/05/19 0945  . rosuvastatin (CRESTOR) tablet 20 mg  20 mg Oral Daily Jennye Boroughs, MD   20 mg at 02/05/19 0945  . sodium chloride flush (NS) 0.9 % injection 3 mL  3 mL Intravenous Q12H Jennye Boroughs, MD   3 mL at 02/05/19 0948  . sodium chloride flush (NS) 0.9 % injection 3 mL  3 mL Intravenous PRN Jennye Boroughs, MD        VITAL SIGNS: BP 123/76   Pulse 88   Temp 98.2 F (36.8 C) (Oral)   Resp 20   Ht 5\' 6"  (1.676 m)   Wt 126 lb 8.7 oz (57.4 kg)   SpO2 100%   BMI 20.42 kg/m  Filed Weights   02/02/19 1900 02/04/19  0347  Weight: 132 lb (59.9 kg) 126 lb 8.7 oz (57.4 kg)    Estimated body mass index is 20.42 kg/m as calculated from the following:   Height as of this encounter: 5\' 6"  (1.676 m).   Weight as of this encounter: 126 lb 8.7 oz (57.4 kg).  LABS: CBC:    Component Value Date/Time   WBC 7.5 02/04/2019 0446   HGB 10.1 (L) 02/04/2019 0446   HCT 30.0 (L) 02/04/2019 0446   PLT 407 (H) 02/04/2019 0446   MCV 67.9 (L) 02/04/2019 0446   NEUTROABS 7.8 (H) 02/02/2019 1929   LYMPHSABS 1.7 02/02/2019 1929   MONOABS 1.0 02/02/2019 1929   EOSABS 0.1 02/02/2019 1929   BASOSABS 0.1 02/02/2019 1929   Comprehensive Metabolic Panel:    Component Value Date/Time   NA 140 02/05/2019 0612   K 4.2 02/05/2019 0612   CL 108 02/05/2019 0612   CO2 24 02/05/2019 0612   BUN 26 (H) 02/05/2019 0612   CREATININE 1.43 (H) 02/05/2019 0612   GLUCOSE 67 (L) 02/05/2019 0612   CALCIUM 9.2 02/05/2019 0612   AST 130 (H) 02/02/2019 1929   ALT 92 (H) 02/02/2019 1929   ALKPHOS 1,819 (H) 02/02/2019 1929   BILITOT 1.0 02/02/2019 1929   PROT 7.4 02/02/2019 1929   ALBUMIN 3.2 (L) 02/02/2019 1929    RADIOGRAPHIC STUDIES: CT ABDOMEN PELVIS WO CONTRAST  Result Date: 02/02/2019 CLINICAL DATA:  Abdominal distension. Recent diagnosis of pulmonary embolus. Multiple liver lesions post biopsy. EXAM: CT ABDOMEN AND PELVIS WITHOUT CONTRAST TECHNIQUE:  Multidetector CT imaging of the abdomen and pelvis was performed following the standard protocol without IV contrast. COMPARISON:  Chest CTA earlier this day. Abdominal CT 2 weeks ago 01/20/2019 FINDINGS: Lower chest: Emphysema scattered subpleural nodules, assessed on chest CT earlier this day. Hepatobiliary: Heterogeneous liver parenchyma. Multiple liver lesions are better defined on prior contrast-enhanced exam. Difficult to assess for change given differences in technique. Postcholecystectomy similar proximal biliary prominence to prior. Motion artifact limits detailed assessment. Pancreas: Motion obscured. Coarse parenchymal calcification in the pancreatic tail. Ductal dilatation in the tail measures up to 8 mm. Heterogeneous low-density in the pancreatic tail, series 2, image 16. Detailed parenchymal assessment is limited in the lack of contrast and motion. Spleen: Normal in size without focal abnormality. Adrenals/Urinary Tract: Bilateral adrenal thickening. No hydronephrosis. There is excreted IV contrast in both renal collecting systems from prior chest CTA. Excreted IV contrast in the urinary bladder. No bladder wall thickening. Stomach/Bowel: Bowel evaluation is limited in the absence of contrast and patient motion. Also paucity of intra-abdominal fat. Stomach is nondistended. No small bowel obstruction or obvious inflammation. Normal appendix. Moderate stool burden throughout the colon. No colonic wall thickening or inflammation. Vascular/Lymphatic: Moderate aortic atherosclerosis. No aneurysm. Limited assessment for adenopathy in the absence of contrast and patient motion. Reproductive: Post hysterectomy per history. No evidence of adnexal mass. Other: Small amount of perihepatic free fluid which has slightly increased in size from prior exam. Fluid is slightly dense and likely represents combination of simple ascites and post biopsy hemorrhage. Small amount of blood dependently in the pelvis. Cannot  assess for active extravasation in the absence of IV contrast, however volume of fluid would not suggest this. Musculoskeletal: Postsurgical change of the anterior abdominal wall. A focal bone abnormality. IMPRESSION: 1. Post recent liver biopsy. Small amount of high-density fluid adjacent to the liver and tracking in the pelvis likely post biopsy hemorrhage. Cannot assess for active extravasation in the absence of  IV contrast, however volume of fluid does not suggest this. Multiple liver lesions are better characterized on prior contrast-enhanced exam. 2. Coarse parenchymal calcification in the pancreatic tail with heterogeneous low-density and distal pancreatic ductal dilatation. In the setting of multiple hepatic lesions, findings are concerning for pancreatic neoplasm, however not well-defined on this noncontrast exam. Aortic Atherosclerosis (ICD10-I70.0). Electronically Signed   By: Keith Rake M.D.   On: 02/02/2019 23:20   CT Head Wo Contrast  Result Date: 02/02/2019 CLINICAL DATA:  Altered mental status EXAM: CT HEAD WITHOUT CONTRAST TECHNIQUE: Contiguous axial images were obtained from the base of the skull through the vertex without intravenous contrast. COMPARISON:  12/31/2018 FINDINGS: Brain: Chronic atrophic and ischemic changes are again seen and stable. No acute hemorrhage, acute infarction or space-occupying mass lesion are seen. Vascular: No hyperdense vessel or unexpected calcification. Skull: Normal. Negative for fracture or focal lesion. Sinuses/Orbits: No acute finding. Other: None. IMPRESSION: Chronic atrophic and ischemic changes similar to that seen on the prior exam. No acute abnormality is noted. Electronically Signed   By: Inez Catalina M.D.   On: 02/02/2019 20:31   CT Angio Chest PE W/Cm &/Or Wo Cm  Result Date: 02/02/2019 CLINICAL DATA:  Shortness of breath EXAM: CT ANGIOGRAPHY CHEST WITH CONTRAST TECHNIQUE: Multidetector CT imaging of the chest was performed using the  standard protocol during bolus administration of intravenous contrast. Multiplanar CT image reconstructions and MIPs were obtained to evaluate the vascular anatomy. CONTRAST:  53mL OMNIPAQUE IOHEXOL 350 MG/ML SOLN COMPARISON:  01/20/2019 FINDINGS: Cardiovascular: Thoracic aorta demonstrates atherosclerotic calcifications without aneurysmal dilatation or dissection. The heart is at the upper limits of normal in size. The pulmonary artery is well visualized with a normal branching pattern. The previously seen pulmonary emboli are again identified bilaterally with some minimal improvement in the small central branches. No significant resolution of the more peripheral emboli is noted. No new significant emboli are noted. Changes of right heart strain are again noted and stable. Mediastinum/Nodes: The esophagus as visualized is within normal limits. No hilar or mediastinal adenopathy is seen. The thoracic inlet is within normal limits. Lungs/Pleura: Diffuse emphysematous changes are noted throughout both lungs. Scattered subpleural nodular changes are seen without significant increase from the prior study. No focal confluent infiltrate is seen. No sizable effusion is noted. No pneumothorax seen. Upper Abdomen: Visualized upper abdomen demonstrates mild perihepatic fluid stable from the prior exam. Stable hypodense lesions are noted throughout the liver consistent with the given clinical history of hepatic metastatic disease. Musculoskeletal: Degenerative changes of the thoracic spine are noted. No acute bony abnormality is seen. Review of the MIP images confirms the above findings. IMPRESSION: Slight improvement in pulmonary emboli bilaterally although the peripheral emboli appear stable. No new emboli are seen. Persistent right heart strain. Emphysematous changes without acute infiltrate. Multiple pulmonary nodules stable from the prior exam which may represent some metastatic disease Hepatic metastatic disease. Aortic  Atherosclerosis (ICD10-I70.0) and Emphysema (ICD10-J43.9). Electronically Signed   By: Inez Catalina M.D.   On: 02/02/2019 20:39   CT Angio Chest PE W/Cm &/Or Wo Cm  Result Date: 01/20/2019 CLINICAL DATA:  Suspected covid, elevated LFTs, recent stroke EXAM: CT ANGIOGRAPHY CHEST, abdomen, and pelvis WITH CONTRAST TECHNIQUE: Multidetector CT imaging of the chest, abdomen, and pelvis was performed using the standard protocol during bolus administration of intravenous contrast. Multiplanar CT image reconstructions and MIPs were obtained to evaluate the vascular anatomy. CONTRAST:  35mL OMNIPAQUE IOHEXOL 350 MG/ML SOLN COMPARISON:  CT chest September 13, 2017 FINDINGS: Cardiovascular: There is a optimal opacification of the pulmonary arteries. There is a small nonocclusive thrombus seen within the left main pulmonary artery which extends into the anterior left upper lobe segmental branches and partially occlusive thrombus in the posterior left lower lobe subsegmental branches. There is also partially occlusive thrombus seen within the posterior right upper lobe segmental branch, series 6, image 37. There is also partially occlusive thrombus seen extending into the right middle lobe subsegmental branches and posterior right lower lobe subsegmental branches. The heart is normal in size. No pericardial effusion or thickening. There is slight straightening of the interventricular septum which could be due to early right ventricular heart strain. There is normal three-vessel brachiocephalic anatomy without proximal stenosis. Scattered atherosclerosis is noted. The thoracic aorta is normal in appearance. Mediastinum/Nodes: No hilar, mediastinal, or axillary adenopathy. Thyroid gland, trachea, and esophagus demonstrate no significant findings. Lungs/Pleura: Small subcentimeter pulmonary nodule are again identified as on prior exam dating back to 2019, for example in the posterior right upper lobe there is a 5 mm pulmonary  nodule, series 8, image 21 and anterior right lower lobe, series 8, image 54 a 4 mm pulmonary nodule. There is however a new small nodular opacity in the posterior left upper lobe, series 8, image 47 measuring 7 mm not seen on prior exam. No large airspace consolidation or pleural effusion is seen. Centrilobular emphysematous changes seen within both lung apices. Musculoskeletal: No chest wall abnormality. No acute or significant osseous findings. Review of the MIP images confirms the above findings. Abdomen/pelvis: Hepatobiliary: There are multiple low-density liver lesions seen throughout which were not seen on the prior exam. There is also heterogeneous enhancement seen throughout the liver parenchyma. Central periportal edema is noted. The portal vein is patent. The patient is status post cholecystectomy. A small amount of perihepatic ascites is noted. Pancreas: Unremarkable. No pancreatic ductal dilatation or surrounding inflammatory changes. Spleen: Normal in size without focal abnormality. Adrenals/Urinary Tract: Both adrenal glands appear normal. The kidneys and collecting system appear normal without evidence of urinary tract calculus or hydronephrosis. Bladder is unremarkable. Stomach/Bowel: The stomach, small bowel, and colon are normal in appearance. No inflammatory changes, wall thickening, or obstructive findings. Vascular/Lymphatic: There are no enlarged mesenteric, retroperitoneal, or pelvic lymph nodes. Scattered aortic atherosclerotic calcifications are seen without aneurysmal dilatation. Reproductive: The uterus and adnexa are unremarkable. Other: No evidence of abdominal wall mass or hernia. Musculoskeletal: No acute or significant osseous findings. IMPRESSION: 1. Bilateral partially occlusive segmental and subsegmental pulmonary emboli as described above. 2. Findings which could be suggestive of early right ventricular heart strain. 3. Multiple unchanged subcentimeter pulmonary nodules as on  prior exam dating back to 2019. 4. New nodular opacity within the posterior left upper lobe measuring 7 mm which is nonspecific, and would recommend attention on short interval follow-up exam. 5. Centrilobular emphysematous changes. 6. Numerous hepatic lesions throughout, likely suggestive of hepatic metastatic disease. 7. Small amount of perihepatic ascites 8. These results were called by telephone at the time of interpretation on 01/20/2019 at 2:07 am to provider [facility] , who verbally acknowledged these results. Electronically Signed   By: Prudencio Pair M.D.   On: 01/20/2019 02:09   CT abd/pelvis w/ IV contrast  Result Date: 01/20/2019 CLINICAL DATA:  Suspected covid, elevated LFTs, recent stroke EXAM: CT ANGIOGRAPHY CHEST, abdomen, and pelvis WITH CONTRAST TECHNIQUE: Multidetector CT imaging of the chest, abdomen, and pelvis was performed using the standard protocol during bolus administration of intravenous  contrast. Multiplanar CT image reconstructions and MIPs were obtained to evaluate the vascular anatomy. CONTRAST:  70mL OMNIPAQUE IOHEXOL 350 MG/ML SOLN COMPARISON:  CT chest September 13, 2017 FINDINGS: Cardiovascular: There is a optimal opacification of the pulmonary arteries. There is a small nonocclusive thrombus seen within the left main pulmonary artery which extends into the anterior left upper lobe segmental branches and partially occlusive thrombus in the posterior left lower lobe subsegmental branches. There is also partially occlusive thrombus seen within the posterior right upper lobe segmental branch, series 6, image 37. There is also partially occlusive thrombus seen extending into the right middle lobe subsegmental branches and posterior right lower lobe subsegmental branches. The heart is normal in size. No pericardial effusion or thickening. There is slight straightening of the interventricular septum which could be due to early right ventricular heart strain. There is normal  three-vessel brachiocephalic anatomy without proximal stenosis. Scattered atherosclerosis is noted. The thoracic aorta is normal in appearance. Mediastinum/Nodes: No hilar, mediastinal, or axillary adenopathy. Thyroid gland, trachea, and esophagus demonstrate no significant findings. Lungs/Pleura: Small subcentimeter pulmonary nodule are again identified as on prior exam dating back to 2019, for example in the posterior right upper lobe there is a 5 mm pulmonary nodule, series 8, image 21 and anterior right lower lobe, series 8, image 54 a 4 mm pulmonary nodule. There is however a new small nodular opacity in the posterior left upper lobe, series 8, image 47 measuring 7 mm not seen on prior exam. No large airspace consolidation or pleural effusion is seen. Centrilobular emphysematous changes seen within both lung apices. Musculoskeletal: No chest wall abnormality. No acute or significant osseous findings. Review of the MIP images confirms the above findings. Abdomen/pelvis: Hepatobiliary: There are multiple low-density liver lesions seen throughout which were not seen on the prior exam. There is also heterogeneous enhancement seen throughout the liver parenchyma. Central periportal edema is noted. The portal vein is patent. The patient is status post cholecystectomy. A small amount of perihepatic ascites is noted. Pancreas: Unremarkable. No pancreatic ductal dilatation or surrounding inflammatory changes. Spleen: Normal in size without focal abnormality. Adrenals/Urinary Tract: Both adrenal glands appear normal. The kidneys and collecting system appear normal without evidence of urinary tract calculus or hydronephrosis. Bladder is unremarkable. Stomach/Bowel: The stomach, small bowel, and colon are normal in appearance. No inflammatory changes, wall thickening, or obstructive findings. Vascular/Lymphatic: There are no enlarged mesenteric, retroperitoneal, or pelvic lymph nodes. Scattered aortic atherosclerotic  calcifications are seen without aneurysmal dilatation. Reproductive: The uterus and adnexa are unremarkable. Other: No evidence of abdominal wall mass or hernia. Musculoskeletal: No acute or significant osseous findings. IMPRESSION: 1. Bilateral partially occlusive segmental and subsegmental pulmonary emboli as described above. 2. Findings which could be suggestive of early right ventricular heart strain. 3. Multiple unchanged subcentimeter pulmonary nodules as on prior exam dating back to 2019. 4. New nodular opacity within the posterior left upper lobe measuring 7 mm which is nonspecific, and would recommend attention on short interval follow-up exam. 5. Centrilobular emphysematous changes. 6. Numerous hepatic lesions throughout, likely suggestive of hepatic metastatic disease. 7. Small amount of perihepatic ascites 8. These results were called by telephone at the time of interpretation on 01/20/2019 at 2:07 am to provider [facility] , who verbally acknowledged these results. Electronically Signed   By: Prudencio Pair M.D.   On: 01/20/2019 02:09   PERIPHERAL VASCULAR CATHETERIZATION  Result Date: 01/22/2019 See op note  US Venous Img Lower Bilateral (DVT)  Result Date: 01/20/2019  CLINICAL DATA:  History of pulmonary embolism, now with lower extremity pain. Evaluate for DVT. EXAM: BILATERAL LOWER EXTREMITY VENOUS DOPPLER ULTRASOUND TECHNIQUE: Gray-scale sonography with graded compression, as well as color Doppler and duplex ultrasound were performed to evaluate the lower extremity deep venous systems from the level of the common femoral vein and including the common femoral, femoral, profunda femoral, popliteal and calf veins including the posterior tibial, peroneal and gastrocnemius veins when visible. The superficial great saphenous vein was also interrogated. Spectral Doppler was utilized to evaluate flow at rest and with distal augmentation maneuvers in the common femoral, femoral and popliteal veins.  COMPARISON:  Chest CTA-01/20/2019 FINDINGS: RIGHT LOWER EXTREMITY Common Femoral Vein: No evidence of thrombus. Normal compressibility, respiratory phasicity and response to augmentation. Saphenofemoral Junction: No evidence of thrombus. Normal compressibility and flow on color Doppler imaging. Profunda Femoral Vein: No evidence of thrombus. Normal compressibility and flow on color Doppler imaging. Femoral Vein: No evidence of thrombus. Normal compressibility, respiratory phasicity and response to augmentation. Popliteal Vein: No evidence of thrombus. Normal compressibility, respiratory phasicity and response to augmentation. Calf Veins: There is hypoechoic occlusive thrombus within both paired right peroneal veins (images 58 and 59). Both paired right posterior tibial veins appear patent where imaged. Superficial Great Saphenous Vein: No evidence of thrombus. Normal compressibility. Other Findings:  None. _________________________________________________________ LEFT LOWER EXTREMITY Common Femoral Vein: No evidence of thrombus. Normal compressibility, respiratory phasicity and response to augmentation. Saphenofemoral Junction: No evidence of thrombus. Normal compressibility and flow on color Doppler imaging. Profunda Femoral Vein: No evidence of thrombus. Normal compressibility and flow on color Doppler imaging. Femoral Vein: No evidence of thrombus. Normal compressibility, respiratory phasicity and response to augmentation. Popliteal Vein: No evidence of thrombus. Normal compressibility, respiratory phasicity and response to augmentation. Calf Veins: There is hypoechoic occlusive thrombus within 1 of the paired left peroneal veins (images 29 and 30). The adjacent paired left peroneal vein as well though both paired posterior tibial veins appear patent. Superficial Great Saphenous Vein: No evidence of thrombus. Normal compressibility. Other Findings:  None. IMPRESSION: Examination is positive for occlusive DVT  involving both paired right peroneal veins as well as one of two paired left peroneal veins. There is no extension of this distal tibial DVT to the more proximal venous system of either lower extremity. Electronically Signed   By: [name] Mariscal M.D.   On: 01/20/2019 17:02   CT BIOPSY  Result Date: 01/27/2019 INDICATION: Multiple liver lesions. EXAM: CT directed liver biopsy. MEDICATIONS: None. ANESTHESIA/SEDATION: Moderate (conscious) sedation was employed during this procedure. A total of Versed 2 mg and Fentanyl 25 mcg was administered intravenously. Moderate Sedation Time: 14 minutes. The patient's level of consciousness and vital signs were monitored continuously by radiology nursing throughout the procedure under my direct supervision. FLUOROSCOPY TIME:  Fluoroscopy Time: 0 minutes 0 seconds (0 mGy). COMPLICATIONS: None immediate. PROCEDURE: After discussing the risks and benefits of this procedure the patient informed consent was obtained. Standard time-out procedure was performed. Patient was sterilely prepped and draped. Following local anesthesia with 1% lidocaine and IV conscious sedation CT-directed liver lesion core biopsy was obtained with 2 good 23 mm 18 gauge core biopsy samples obtained. Sample sent to pathology. There no complications. IMPRESSION: Successful liver lesion CT-directed biopsy. Electronically Signed   By: Marcello Moores  Register   On: 01/27/2019 11:47   DG Chest Portable 1 View  Result Date: 02/02/2019 CLINICAL DATA:  Shortness of breath, stomach pain EXAM: PORTABLE CHEST 1 VIEW COMPARISON:  01/23/2019 FINDINGS:  The heart size and mediastinal contours are within normal limits. Emphysema. The visualized skeletal structures are unremarkable. IMPRESSION: Emphysema without acute abnormality of the lungs in AP portable projection. Electronically Signed   By: Eddie Candle M.D.   On: 02/02/2019 19:06   DG Chest Port 1 View  Result Date: 01/23/2019 CLINICAL DATA:  Hypertension, pulmonary  emboli EXAM: PORTABLE CHEST 1 VIEW COMPARISON:  01/20/2019, 01/19/2019 FINDINGS: The heart size and mediastinal contours are stable. No new focal airspace consolidation, pleural effusion, or pneumothorax. Lungs are mildly hyperexpanded. The visualized skeletal structures are unremarkable. IMPRESSION: Stable appearance of the chest without new airspace opacity. Electronically Signed   By: Davina Poke M.D.   On: 01/23/2019 12:54   DG Chest Portable 1 View  Result Date: 01/19/2019 CLINICAL DATA:  Shortness of breath EXAM: PORTABLE CHEST 1 VIEW COMPARISON:  12/31/2018 FINDINGS: Cardiomegaly. Lungs clear. No effusions or edema. No acute bony abnormality. IMPRESSION: Cardiomegaly.  No active disease. Electronically Signed   By: Rolm Baptise M.D.   On: 01/19/2019 23:33   ECHOCARDIOGRAM COMPLETE  Result Date: 02/03/2019   ECHOCARDIOGRAM REPORT   Patient Name:   ALBIE COLBECK Date of Exam: 02/03/2019 Medical Rec #:  [id number]         Height:       66.0 in Accession #:    [id number]        Weight:       132.0 lb Date of Birth:  [birth year]         BSA:          1.68 m Patient Age:    37 years          BP:           145/86 mmHg Patient Gender: F                 HR:           95 bpm. Exam Location:  [facility] Procedure: 2D Echo, Cardiac Doppler and Color Doppler Indications:     I50.31 CHF-Acute Diastolic  History:         Patient has prior history of Echocardiogram examinations, most                  recent 01/20/2019. Risk Factors:Hypertension and Diabetes.  Sonographer:     Charmayne Sheer RDCS (AE) Referring Phys:  [id number] Athena Masse Diagnosing Phys: Kathlyn Sacramento MD  Sonographer Comments: Suboptimal subcostal window. IMPRESSIONS  1. Left ventricular ejection fraction, by visual estimation, is 55 to 60%. The left ventricle has normal function. There is mildly increased left ventricular hypertrophy.  2. Left ventricular diastolic parameters are consistent with Grade I diastolic dysfunction (impaired relaxation).   3. Right ventricular pressure overload.  4. The left ventricle has no regional wall motion abnormalities.  5. Global right ventricle has mildly reduced systolic function.The right ventricular size is moderately enlarged. No increase in right ventricular wall thickness.  6. Left atrial size was normal.  7. Right atrial size was moderately dilated.  8. The mitral valve is normal in structure. No evidence of mitral valve regurgitation. No evidence of mitral stenosis.  9. The tricuspid valve is normal in structure. Tricuspid valve regurgitation moderate. 10. The aortic valve is normal in structure. Aortic valve regurgitation is not visualized. Mild to moderate aortic valve sclerosis/calcification without any evidence of aortic stenosis. 11. The pulmonic valve was normal in structure. Pulmonic valve regurgitation is not visualized. 12. Moderately to severely elevated pulmonary  artery systolic pressure. 13. The tricuspid regurgitant velocity is 4.02 m/s, and with an assumed right atrial pressure of 5 mmHg, the estimated right ventricular systolic pressure is moderately elevated at 69.6 mmHg. FINDINGS  Left Ventricle: Left ventricular ejection fraction, by visual estimation, is 55 to 60%. The left ventricle has normal function. The left ventricle has no regional wall motion abnormalities. There is mildly increased left ventricular hypertrophy. The interventricular septum is flattened in systole, consistent with right ventricular pressure overload. Left ventricular diastolic parameters are consistent with Grade I diastolic dysfunction (impaired relaxation). Normal left atrial pressure. Right Ventricle: The right ventricular size is moderately enlarged. No increase in right ventricular wall thickness. Global RV systolic function is has mildly reduced systolic function. The tricuspid regurgitant velocity is 4.02 m/s, and with an assumed right atrial pressure of 5 mmHg, the estimated right ventricular systolic pressure is  moderately elevated at 69.6 mmHg. Left Atrium: Left atrial size was normal in size. Right Atrium: Right atrial size was moderately dilated Pericardium: There is no evidence of pericardial effusion. Mitral Valve: The mitral valve is normal in structure. No evidence of mitral valve regurgitation. No evidence of mitral valve stenosis by observation. MV peak gradient, 4.4 mmHg. Tricuspid Valve: The tricuspid valve is normal in structure. Tricuspid valve regurgitation moderate. Aortic Valve: The aortic valve is normal in structure. Aortic valve regurgitation is not visualized. Mild to moderate aortic valve sclerosis/calcification is present, without any evidence of aortic stenosis. Aortic valve mean gradient measures 4.0 mmHg. Aortic valve peak gradient measures 6.9 mmHg. Aortic valve area, by VTI measures 2.09 cm. Pulmonic Valve: The pulmonic valve was normal in structure. Pulmonic valve regurgitation is not visualized. Pulmonic regurgitation is not visualized. Aorta: The aortic root, ascending aorta and aortic arch are all structurally normal, with no evidence of dilitation or obstruction. IAS/Shunts: No atrial level shunt detected by color flow Doppler. There is no evidence of a patent foramen ovale. No ventricular septal defect is seen or detected. There is no evidence of an atrial septal defect.  LEFT VENTRICLE PLAX 2D LVIDd:         3.70 cm  Diastology LVIDs:         2.41 cm  LV e' lateral:   6.64 cm/s LV PW:         0.78 cm  LV E/e' lateral: 7.4 LV IVS:        0.71 cm LVOT diam:     1.90 cm LV SV:         38 ml LV SV Index:   22.62 LVOT Area:     2.84 cm  RIGHT VENTRICLE RV Basal diam:  2.91 cm LEFT ATRIUM             Index       RIGHT ATRIUM           Index LA diam:        2.80 cm 1.67 cm/m  RA Area:     23.20 cm LA Vol (A2C):   31.5 ml 18.79 ml/m RA Volume:   86.80 ml  51.79 ml/m LA Vol (A4C):   27.0 ml 16.11 ml/m LA Biplane Vol: 29.8 ml 17.78 ml/m  AORTIC VALVE                   PULMONIC VALVE AV Area  (Vmax):    1.97 cm    PV Vmax:       0.75 m/s AV Area (Vmean):   1.94 cm  PV Vmean:      54.900 cm/s AV Area (VTI):     2.09 cm    PV VTI:        0.146 m AV Vmax:           131.00 cm/s PV Peak grad:  2.3 mmHg AV Vmean:          86.200 cm/s PV Mean grad:  1.0 mmHg AV VTI:            0.209 m AV Peak Grad:      6.9 mmHg AV Mean Grad:      4.0 mmHg LVOT Vmax:         90.90 cm/s LVOT Vmean:        59.100 cm/s LVOT VTI:          0.154 m LVOT/AV VTI ratio: 0.74  AORTA Ao Root diam: 2.70 cm MITRAL VALVE                        TRICUSPID VALVE MV Area (PHT): 3.91 cm             TR Peak grad:   64.6 mmHg MV Peak grad:  4.4 mmHg             TR Vmax:        421.00 cm/s MV Mean grad:  2.0 mmHg MV Vmax:       1.05 m/s             SHUNTS MV Vmean:      64.9 cm/s            Systemic VTI:  0.15 m MV VTI:        0.14 m               Systemic Diam: 1.90 cm MV PHT:        56.26 msec MV Decel Time: 194 msec MV E velocity: 49.00 cm/s 103 cm/s MV A velocity: 97.00 cm/s 70.3 cm/s MV E/A ratio:  0.51       1.5  Kathlyn Sacramento MD Electronically signed by Kathlyn Sacramento MD Signature Date/Time: 02/03/2019/1:36:07 PM    Final    ECHOCARDIOGRAM COMPLETE  Result Date: 01/20/2019   ECHOCARDIOGRAM REPORT   Patient Name:   LAKYA SIMENTAL Date of Exam: 01/20/2019 Medical Rec #:  [id number]         Height:       66.0 in Accession #:    [id number]        Weight:       122.0 lb Date of Birth:  06-22-1950         BSA:          1.62 m Patient Age:    89 years          BP:           116/71 mmHg Patient Gender: F                 HR:           91 bpm. Exam Location:  [facility] Procedure: 2D Echo, Cardiac Doppler and Color Doppler Indications:     Abnormal ECG 794.31  History:         Patient has no prior history of Echocardiogram examinations.                  Risk Factors:Hypertension and Diabetes.  Sonographer:     Sherrie Sport RDCS (  AE) Referring Phys:  [id number] [place] Diagnosing Phys: Serafina Royals MD IMPRESSIONS  1. Left ventricular  ejection fraction, by visual estimation, is 55 to 60%. The left ventricle has normal function. There is no left ventricular hypertrophy.  2. Global right ventricle has moderately reduced systolic function.The right ventricular size is moderately enlarged. Mildly increased right ventricular wall thickness.  3. Left atrial size was normal.  4. Right atrial size was moderately dilated.  5. The mitral valve is normal in structure. Trace mitral valve regurgitation.  6. The tricuspid valve is normal in structure. Tricuspid valve regurgitation moderate.  7. The aortic valve is normal in structure. Aortic valve regurgitation is not visualized.  8. The pulmonic valve was grossly normal. Pulmonic valve regurgitation is trivial.  9. Moderately elevated pulmonary artery systolic pressure. 10. The tricuspid regurgitant velocity is 3.62 m/s, and with an assumed right atrial pressure of 10 mmHg, the estimated right ventricular systolic pressure is moderately elevated at 62.6 mmHg. FINDINGS  Left Ventricle: Left ventricular ejection fraction, by visual estimation, is 55 to 60%. The left ventricle has normal function. There is no left ventricular hypertrophy. Right Ventricle: The right ventricular size is moderately enlarged. Mildly increased right ventricular wall thickness. Global RV systolic function is has moderately reduced systolic function. The tricuspid regurgitant velocity is 3.62 m/s, and with an assumed right atrial pressure of 10 mmHg, the estimated right ventricular systolic pressure is moderately elevated at 62.6 mmHg. Left Atrium: Left atrial size was normal in size. Right Atrium: Right atrial size was moderately dilated Pericardium: There is no evidence of pericardial effusion. Mitral Valve: The mitral valve is normal in structure. Trace mitral valve regurgitation. Tricuspid Valve: The tricuspid valve is normal in structure. Tricuspid valve regurgitation moderate. Aortic Valve: The aortic valve is normal in  structure. Aortic valve regurgitation is not visualized. Aortic valve mean gradient measures 3.7 mmHg. Aortic valve peak gradient measures 5.2 mmHg. Aortic valve area, by VTI measures 1.65 cm. Pulmonic Valve: The pulmonic valve was grossly normal. Pulmonic valve regurgitation is trivial. Aorta: The aortic root and ascending aorta are structurally normal, with no evidence of dilitation. IAS/Shunts: No atrial level shunt detected by color flow Doppler.  LEFT VENTRICLE PLAX 2D LVIDd:         3.16 cm  Diastology LVIDs:         1.90 cm  LV e' lateral:   5.77 cm/s LV PW:         1.41 cm  LV E/e' lateral: 9.3 LV IVS:        1.11 cm  LV e' medial:    3.92 cm/s LVOT diam:     2.00 cm  LV E/e' medial:  13.6 LV SV:         29 ml LV SV Index:   17.85 LVOT Area:     3.14 cm  RIGHT VENTRICLE RV Basal diam:  4.44 cm RV S prime:     10.20 cm/s TAPSE (M-mode): 3.0 cm LEFT ATRIUM             Index       RIGHT ATRIUM           Index LA diam:        2.40 cm 1.48 cm/m  RA Area:     25.70 cm LA Vol (A2C):   21.1 ml 13.02 ml/m RA Volume:   96.00 ml  59.23 ml/m LA Vol (A4C):   9.6 ml  5.94 ml/m LA Biplane Vol:  14.6 ml 9.01 ml/m  AORTIC VALVE                   PULMONIC VALVE AV Area (Vmax):    1.61 cm    PV Vmax:        0.42 m/s AV Area (Vmean):   1.45 cm    PV Peak grad:   0.7 mmHg AV Area (VTI):     1.65 cm    RVOT Peak grad: 1 mmHg AV Vmax:           113.67 cm/s AV Vmean:          86.200 cm/s AV VTI:            0.186 m AV Peak Grad:      5.2 mmHg AV Mean Grad:      3.7 mmHg LVOT Vmax:         58.40 cm/s LVOT Vmean:        39.900 cm/s LVOT VTI:          0.098 m LVOT/AV VTI ratio: 0.52  AORTA Ao Root diam: 2.90 cm MITRAL VALVE                        TRICUSPID VALVE MV Area (PHT): 6.32 cm             TR Peak grad:   52.6 mmHg MV PHT:        34.80 msec           TR Vmax:        364.00 cm/s MV Decel Time: 120 msec MV E velocity: 53.40 cm/s 103 cm/s  SHUNTS MV A velocity: 94.10 cm/s 70.3 cm/s Systemic VTI:  0.10 m MV E/A ratio:   0.57       1.5       Systemic Diam: 2.00 cm  Serafina Royals MD Electronically signed by Serafina Royals MD Signature Date/Time: 01/20/2019/4:41:26 PM    Final     PERFORMANCE STATUS (ECOG) : 3 - Symptomatic, >50% confined to bed  Review of Systems Unless otherwise noted, a complete review of systems is negative.  Physical Exam General: NAD, frail appearing, thin Pulmonary: Unlabored Extremities: no edema, no joint deformities Skin: no rashes Neurological: Weakness, confusion  IMPRESSION: Follow-up visit.  Patient groggy appearing.  Wakes when stimulated but is confused.  Had chemotherapy today.  Had a hypoglycemic event this morning but subsequent CBGs have been improved after dextrose was given.  Insulin was subsequently adjusted.  Discussed with nurse.  I called and spoke with patient's sister.  She remains in agreement with current scope of treatment.  PLAN: -Continue current scope of treatment -We will follow   Patient expressed understanding and was in agreement with this plan. She also understands that She can call the clinic at any time with any questions, concerns, or complaints.     Time Total: 20 minutes  Visit consisted of counseling and education dealing with the complex and emotionally intense issues of symptom management and palliative care in the setting of serious and potentially life-threatening illness.Greater than 50%  of this time was spent counseling and coordinating care related to the above assessment and plan.  Signed by: Altha Harm, PhD, NP-C

## 2019-02-05 NOTE — Progress Notes (Signed)
Hypoglycemic Event  CBG: 35 & 39  Treatment: 1 amp D50  Symptoms: patient minimally responsive  Follow-up CBG: Time: 0801 CBG Result:145  Possible Reasons for Event: unknown  Comments/MD notified:Dr. Izetta Dakin, no new orders   Clarise Cruz, BSN

## 2019-02-06 ENCOUNTER — Inpatient Hospital Stay: Payer: Medicare PPO

## 2019-02-06 LAB — BASIC METABOLIC PANEL WITH GFR
Anion gap: 12 (ref 5–15)
BUN: 30 mg/dL — ABNORMAL HIGH (ref 8–23)
CO2: 18 mmol/L — ABNORMAL LOW (ref 22–32)
Calcium: 8.8 mg/dL — ABNORMAL LOW (ref 8.9–10.3)
Chloride: 106 mmol/L (ref 98–111)
Creatinine, Ser: 1.55 mg/dL — ABNORMAL HIGH (ref 0.44–1.00)
GFR calc Af Amer: 39 mL/min — ABNORMAL LOW
GFR calc non Af Amer: 34 mL/min — ABNORMAL LOW
Glucose, Bld: 194 mg/dL — ABNORMAL HIGH (ref 70–99)
Potassium: 4.5 mmol/L (ref 3.5–5.1)
Sodium: 136 mmol/L (ref 135–145)

## 2019-02-06 LAB — GLUCOSE, CAPILLARY
Glucose-Capillary: 35 mg/dL — CL (ref 70–99)
Glucose-Capillary: 482 mg/dL — ABNORMAL HIGH (ref 70–99)
Glucose-Capillary: 312 mg/dL — ABNORMAL HIGH (ref 70–99)
Glucose-Capillary: 150 mg/dL — ABNORMAL HIGH (ref 70–99)
Glucose-Capillary: 156 mg/dL — ABNORMAL HIGH (ref 70–99)

## 2019-02-06 MED ORDER — INSULIN ASPART 100 UNIT/ML ~~LOC~~ SOLN
SUBCUTANEOUS | Status: AC
  Filled 2019-02-06: qty 1

## 2019-02-06 MED ORDER — GABAPENTIN 800 MG PO TABS: 400.0000 mg | ORAL_TABLET | Freq: Three times a day (TID) | ORAL | 0 refills | Status: DC

## 2019-02-06 MED ORDER — INSULIN ASPART 100 UNIT/ML ~~LOC~~ SOLN
0.0000 [IU] | SUBCUTANEOUS | Status: DC
  Administered 2019-02-06: 4 [IU] via SUBCUTANEOUS
  Administered 2019-02-06: 15 [IU] via SUBCUTANEOUS
  Administered 2019-02-06: 3 [IU] via SUBCUTANEOUS
  Administered 2019-02-06: 20 [IU] via SUBCUTANEOUS
  Filled 2019-02-06 (×3): qty 1

## 2019-02-06 NOTE — Plan of Care (Signed)
  Problem: Clinical Measurements: Goal: Ability to maintain clinical measurements within normal limits will improve Outcome: Adequate for Discharge   Problem: Activity: Goal: Risk for activity intolerance will decrease Outcome: Adequate for Discharge   Problem: Coping: Goal: Level of anxiety will decrease Outcome: Adequate for Discharge

## 2019-02-06 NOTE — Discharge Summary (Signed)
Physician Discharge Summary  Patient ID: [name] MRN: [id number] DOB/AGE: July 22, 1950 68 y.o.  Admit date: 02/02/2019 Discharge date: 02/06/2019  Admission Diagnoses: Acute on chronic respiratory failure  Discharge Diagnoses:  Principal Problem:   Acute on chronic respiratory failure with hypoxia (HCC) Active Problems:   Hypertension   Insulin dependent type 2 diabetes mellitus ([place])   Acute metabolic encephalopathy   Liver metastases (HCC)   CKD (chronic kidney disease), stage III   History of pulmonary embolism   History of CVA (cerebrovascular accident) without residual deficits   Pulmonary embolism (HCC)   Cor pulmonale, acute (HCC)   COPD with acute bronchitis (HCC)   Elevated troponin   Goals of care, counseling/discussion   Palliative care encounter   Discharged Condition: fair  Hospital Course:  HPI on admission: Audrey Chan is a 68 y.o. female with medical history of COPD and chronic respiratory failure on home O2 at 2 L as well as history of diabetes, CVA without sequela, CKD 3 with recent hospitalization from 01/22/19, to 01/25/2019 for acute PE with thrombectomy, associated DVTs, with incidental finding of hepatic metastases, status post outpatient biopsy on 01/27/2019, who returns to the emergency room with shortness of breath and complaints of abdominal pain.    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 Rogue Bussing.  -Patient seemed chemotherapy on 02/05/2019 in hospital; well -Eating outpatient PET scan -The oncologist clinic will call the patient for outpatient treatment next week -Palliative care was consulted.  The spoke with patient and  patient's family.  At this point they would want to continue with current treatment plan.  CKD stage III: Creatinine stable -Status post IV hydration -Encourage adequate p.o. hydration -Stopped patient's Metformin home medication -Follow up with PCP preferably in 1 week with repeat renal function  COPD with probable acute exacerbation: Currently stable  -Status post IV Rocephin.  Continue bronchodilators.    Elevated liver enzymes: Probably from metastatic liver disease -Monitor patient  Type 2 diabetes mellitus with hyperglycemia:  Much stable resume home sliding scale insulin and Lantus -Stopped Metformin as her kidney function is not her baseline  Hypertension: Continue antihypertensives  History of stroke: Continue Eliquis  Urinary retention: Resolved patient was able to void on her own on a regular basis -Status post straight cath -Vies patient to follow-up with PCP  Consults: Oncology  Significant Diagnostic Studies: Radiology and blood work  Treatments: As per hospital course and discharge med list  Discharge Exam: Blood pressure 126/79, pulse 78, temperature 97.6 F (36.4 C), temperature source Oral, resp. rate 18, height 5\' 6"  (1.676 m), weight 60.3 kg, SpO2 95 %.  GEN: NAD SKIN: No rash EYES: Anicteric ENT: MMM CV: RRR PULM: CTA B ABD: soft, ND, NT, +BS CNS: Alert, confused, non focal EXT: No edema or tenderness  Disposition: Discharge disposition: 01-Home or Self Care     Patient was deemed stable to be discharged above discharge instruction recommended follow-ups.  Warning signs were explained to patient's when she must seek immediate medical attention's.  Patient and her sister expressed understanding.  Discharge Instructions    Call MD for:  difficulty breathing, headache or visual disturbances   Complete by: As directed  Call MD for:  persistant dizziness or light-headedness   Complete by: As directed    Call MD for:  redness,  tenderness, or signs of infection (pain, swelling, redness, odor or green/yellow discharge around incision site)   Complete by: As directed    Call MD for:  temperature >100.4   Complete by: As directed    Diet - low sodium heart healthy   Complete by: As directed    Increase activity slowly   Complete by: As directed    Per PT recs     Allergies as of 02/06/2019   No Known Allergies     Medication List    STOP taking these medications   amLODipine 5 MG tablet Commonly known as: NORVASC   enoxaparin 60 MG/0.6ML injection Commonly known as: LOVENOX   metFORMIN 500 MG 24 hr tablet Commonly known as: GLUCOPHAGE-XR     TAKE these medications   acetaminophen 500 MG tablet Commonly known as: TYLENOL Take 500-1,000 mg by mouth every 6 (six) hours as needed for moderate pain.   albuterol 108 (90 Base) MCG/ACT inhaler Commonly known as: VENTOLIN HFA Inhale 2 puffs into the lungs every 6 (six) hours as needed for wheezing or shortness of breath.   Anoro Ellipta 62.5-25 MCG/INH Aepb Generic drug: umeclidinium-vilanterol Inhale 1 puff into the lungs daily as needed (shortness of breath).   apixaban 5 MG Tabs tablet Commonly known as: ELIQUIS Take 1 tablet (5 mg total) by mouth 2 (two) times daily.   cetirizine 10 MG tablet Commonly known as: ZYRTEC Take 10 mg by mouth daily.   Crestor 20 MG tablet Generic drug: rosuvastatin Take 20 mg by mouth daily.   ferrous sulfate 325 (65 FE) MG tablet Take 325 mg by mouth 2 (two) times daily with a meal.   fluticasone 50 MCG/ACT nasal spray Commonly known as: FLONASE Place 2 sprays into both nostrils daily as needed for allergies or rhinitis.   gabapentin 800 MG tablet Commonly known as: NEURONTIN Take 0.5 tablets (400 mg total) by mouth 3 (three) times daily. What changed: how much to take   insulin aspart 100 UNIT/ML injection Commonly known as: NovoLOG Check blood sugar prior to meals and give insulin on the following  scale: Blood sugar less than 150: 0 units BS 150-200: 2 units BS 200-250: 3 units BS 250-300: 4 units BS 300-350: 5 units BS 350-400: 6 units BS above 400: 8 units and call your doctor.   ipratropium-albuterol 0.5-2.5 (3) MG/3ML Soln Commonly known as: DUONEB Take 3 mLs by nebulization every 6 (six) hours as needed. What changed: reasons to take this   Lantus SoloStar 100 UNIT/ML Solostar Pen Generic drug: Insulin Glargine Inject 25 Units into the skin at bedtime.   lisinopril 10 MG tablet Commonly known as: ZESTRIL Take 1 tablet (10 mg total) by mouth daily.   metoprolol tartrate 25 MG tablet Commonly known as: LOPRESSOR Take 12.5 mg by mouth 2 (two) times daily.   mometasone 0.1 % cream Commonly known as: ELOCON Apply 1 application topically 2 (two) times daily as needed (rash).   Multi-Vitamins Tabs Take 1 tablet by mouth daily.   ondansetron 8 MG tablet Commonly known as: ZOFRAN One pill every 8 hours as needed for nausea/vomitting.   polyethylene glycol 17 g packet Commonly known as: MIRALAX / GLYCOLAX Take 17 g by mouth daily as needed for moderate constipation.   Vitamin D3 25 MCG (1000 UT) Caps Take 1,000 Units by mouth daily.  Signed: Thornell Mule 02/06/2019, 3:52 PM

## 2019-02-06 NOTE — Care Management Important Message (Signed)
Important Message  Patient Details  Name: Audrey Chan MRN: [id number] Date of Birth: 07-Apr-1950   Medicare Important Message Given:  Yes     Dannette Barbara 02/06/2019, 2:13 PM

## 2019-02-06 NOTE — Progress Notes (Signed)
Audrey Chan   DOB:[birth year]   [id number]    Subjective: Patient received chemotherapy carbotaxol weekly dose on 12/16.   Overnight patient has been confused; unable to give any meaningful history.   Objective:  Vitals:   02/06/19 0405 02/06/19 0755  BP: 119/77 126/79  Pulse: 73 78  Resp: 17 18  Temp: 97.6 F (36.4 C) 97.6 F (36.4 C)  SpO2: 96% 95%     Intake/Output Summary (Last 24 hours) at 02/06/2019 0841 Last data filed at 02/06/2019 0030 Gross per 24 hour  Intake 750 ml  Output 1076 ml  Net -326 ml    Physical Exam  Constitutional:  Patient is pleasantly confused.  Alert.  Appears cachectic.  HENT:  Head: Normocephalic and atraumatic.  Mouth/Throat: Oropharynx is clear and moist. No oropharyngeal exudate.  Eyes: Pupils are equal, round, and reactive to light.  Cardiovascular: Normal rate and regular rhythm.  Pulmonary/Chest: No respiratory distress. She has no wheezes.  Decreased breath sounds bilaterally the bases.  Abdominal: Soft. Bowel sounds are normal. She exhibits no distension and no mass. There is no abdominal tenderness. There is no rebound and no guarding.  Musculoskeletal:        General: No tenderness or edema. Normal range of motion.     Cervical back: Normal range of motion and neck supple.  Neurological: She is alert.  Skin: Skin is warm.  Psychiatric: Affect normal.    Labs:  Lab Results  Component Value Date   WBC 7.5 02/04/2019   HGB 10.1 (L) 02/04/2019   HCT 30.0 (L) 02/04/2019   MCV 67.9 (L) 02/04/2019   PLT 407 (H) 02/04/2019   NEUTROABS 7.8 (H) 02/02/2019    Lab Results  Component Value Date   NA 136 02/06/2019   K 4.5 02/06/2019   CL 106 02/06/2019   CO2 18 (L) 02/06/2019    Studies:  No results found.  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  Carcinoma of unknown primary-  CEA/CA 27-29/ca-15-3-elevated; CA 19-9 normal. Awaiting outpatient PET scan; NGS testing results.  Currently s/p weekly dose of carbotaxol 12/16-tolerated fairly well without any acute infusion reactions.  We will plan outpatient treatment next week  # Bilateral PE-right heart strain-currently on Eliquis. STABLE; Currently on 2-3 L of nasal cannula.  # CKD- stage III -STABLE.   #Episodes of confusion-likely metabolic-questionable diabetes/delirium from hospitalizations. Recent MRI brain nov 11th- negative metastasis.  Defer to primary team. I spoke to patient's sister- who is still concerned about patient ongoing confusion. She wants this to be addressed prior to discharge.   [name], MD 02/06/2019  8:41 AM

## 2019-02-06 NOTE — Progress Notes (Signed)
MD order received in [facility] to discharge pt home with home health today; [facility] team previously established home health services with [facility]; verbally reviewed AVS with pt and pt's sister; questions regarding medications answered to their understanding; pt discharged via wheelchair by nursing on 2L portable O2 [place] to the [place] entrance

## 2019-02-06 NOTE — Progress Notes (Signed)
Nutrition  Patient currently hospitalized and unable to complete outpatient cancer center nutrition assessment today.   Will follow-up at later time.  Antonieta Slaven B. Zenia Resides, [place], [place] Registered Dietitian [phone] (pager)

## 2019-02-06 NOTE — TOC Transition Note (Signed)
Transition of Care [facility]) - CM/SW Discharge Note   Patient Details  Name: AIDANA KAMRATH MRN: [id number] Date of Birth: 15-Nov-1950  [facility]) CM/SW Contact:  Shelbie Hutching, RN Phone Number: 02/06/2019, 1:34 PM   Clinical Narrative:    Patient is ready to be discharged home today.  Patient will discharge home with her sister, [name].  Patient's sister [name] lives with her and has been helping to care for her at home.  Patient will discharge with home health services through [facility] for RN, PT, OT, aide, and [place] with [facility] is aware of discharge today.  Patient is already set up with home O2.  Patient's sister will provide transportation home.    Final next level of care: Home w Home Health Services Barriers to Discharge: Barriers Resolved   Patient Goals and CMS Choice   CMS Medicare.gov Compare Post Acute Care list provided to:: Patient Choice offered to / list presented to : Patient, Sibling  Discharge Placement                       Discharge Plan and Services   Discharge Planning Services: CM Consult                      HH Arranged: RN, PT, OT, Nurse's Aide [facility] Agency: [facility] Date [facility] Agency Contacted: 02/06/19 Time [place]: [id number] Representative spoke with at [place]: [place] ([place]) Interventions     Readmission Risk Interventions Readmission Risk Prevention Plan 01/03/2019  Transportation Screening Complete  PCP or Specialist Appt within 5-7 Days Complete  Home Care Screening Complete  Medication Review (RN CM) Complete  Some recent data might be hidden

## 2019-02-07 ENCOUNTER — Inpatient Hospital Stay: Payer: Medicare PPO

## 2019-02-07 ENCOUNTER — Emergency Department: Payer: Medicare PPO

## 2019-02-07 ENCOUNTER — Other Ambulatory Visit: Payer: Self-pay

## 2019-02-07 ENCOUNTER — Ambulatory Visit: Admission: RE | Admit: 2019-02-07 | Payer: Medicare PPO | Source: Ambulatory Visit

## 2019-02-07 ENCOUNTER — Inpatient Hospital Stay (HOSPITAL_COMMUNITY)
Admission: EM | Admit: 2019-02-07 | Discharge: 2019-02-13 | Disposition: A | Discharge status: Hospice | Payer: Medicare PPO | Source: Home / Self Care | Attending: Internal Medicine | Admitting: Internal Medicine

## 2019-02-07 DIAGNOSIS — J449 Chronic obstructive pulmonary disease, unspecified: Secondary | ICD-10-CM | POA: Diagnosis present

## 2019-02-07 DIAGNOSIS — N179 Acute kidney failure, unspecified: Secondary | ICD-10-CM | POA: Diagnosis present

## 2019-02-07 DIAGNOSIS — E1165 Type 2 diabetes mellitus with hyperglycemia: Secondary | ICD-10-CM | POA: Diagnosis present

## 2019-02-07 DIAGNOSIS — I63443 Cerebral infarction due to embolism of bilateral cerebellar arteries: Secondary | ICD-10-CM | POA: Diagnosis present

## 2019-02-07 DIAGNOSIS — I071 Rheumatic tricuspid insufficiency: Secondary | ICD-10-CM | POA: Diagnosis present

## 2019-02-07 DIAGNOSIS — E875 Hyperkalemia: Secondary | ICD-10-CM | POA: Diagnosis present

## 2019-02-07 DIAGNOSIS — C787 Secondary malignant neoplasm of liver and intrahepatic bile duct: Secondary | ICD-10-CM | POA: Diagnosis present

## 2019-02-07 DIAGNOSIS — R29702 NIHSS score 2: Secondary | ICD-10-CM | POA: Diagnosis present

## 2019-02-07 DIAGNOSIS — N1831 Chronic kidney disease, stage 3a: Secondary | ICD-10-CM | POA: Diagnosis present

## 2019-02-07 DIAGNOSIS — G459 Transient cerebral ischemic attack, unspecified: Secondary | ICD-10-CM | POA: Diagnosis present

## 2019-02-07 DIAGNOSIS — Z20828 Contact with and (suspected) exposure to other viral communicable diseases: Secondary | ICD-10-CM | POA: Diagnosis present

## 2019-02-07 DIAGNOSIS — Z515 Encounter for palliative care: Secondary | ICD-10-CM | POA: Diagnosis present

## 2019-02-07 DIAGNOSIS — Z87891 Personal history of nicotine dependence: Secondary | ICD-10-CM

## 2019-02-07 DIAGNOSIS — E871 Hypo-osmolality and hyponatremia: Secondary | ICD-10-CM | POA: Diagnosis present

## 2019-02-07 DIAGNOSIS — C801 Malignant (primary) neoplasm, unspecified: Secondary | ICD-10-CM | POA: Diagnosis present

## 2019-02-07 DIAGNOSIS — R7309 Other abnormal glucose: Secondary | ICD-10-CM | POA: Diagnosis not present

## 2019-02-07 DIAGNOSIS — I639 Cerebral infarction, unspecified: Secondary | ICD-10-CM | POA: Diagnosis not present

## 2019-02-07 DIAGNOSIS — R569 Unspecified convulsions: Secondary | ICD-10-CM | POA: Diagnosis not present

## 2019-02-07 DIAGNOSIS — Z86711 Personal history of pulmonary embolism: Secondary | ICD-10-CM

## 2019-02-07 DIAGNOSIS — Z8673 Personal history of transient ischemic attack (TIA), and cerebral infarction without residual deficits: Secondary | ICD-10-CM

## 2019-02-07 DIAGNOSIS — Z66 Do not resuscitate: Secondary | ICD-10-CM | POA: Diagnosis present

## 2019-02-07 DIAGNOSIS — I6340 Cerebral infarction due to embolism of unspecified cerebral artery: Secondary | ICD-10-CM | POA: Diagnosis present

## 2019-02-07 DIAGNOSIS — E1122 Type 2 diabetes mellitus with diabetic chronic kidney disease: Secondary | ICD-10-CM | POA: Diagnosis present

## 2019-02-07 DIAGNOSIS — G9349 Other encephalopathy: Secondary | ICD-10-CM | POA: Diagnosis present

## 2019-02-07 DIAGNOSIS — I2699 Other pulmonary embolism without acute cor pulmonale: Secondary | ICD-10-CM | POA: Diagnosis not present

## 2019-02-07 DIAGNOSIS — J9611 Chronic respiratory failure with hypoxia: Secondary | ICD-10-CM | POA: Diagnosis present

## 2019-02-07 DIAGNOSIS — Z86718 Personal history of other venous thrombosis and embolism: Secondary | ICD-10-CM

## 2019-02-07 DIAGNOSIS — I63119 Cerebral infarction due to embolism of unspecified vertebral artery: Secondary | ICD-10-CM | POA: Diagnosis not present

## 2019-02-07 DIAGNOSIS — E119 Type 2 diabetes mellitus without complications: Secondary | ICD-10-CM | POA: Diagnosis not present

## 2019-02-07 DIAGNOSIS — G894 Chronic pain syndrome: Secondary | ICD-10-CM | POA: Diagnosis not present

## 2019-02-07 DIAGNOSIS — Z9981 Dependence on supplemental oxygen: Secondary | ICD-10-CM

## 2019-02-07 DIAGNOSIS — E109 Type 1 diabetes mellitus without complications: Secondary | ICD-10-CM

## 2019-02-07 DIAGNOSIS — Z794 Long term (current) use of insulin: Secondary | ICD-10-CM

## 2019-02-07 DIAGNOSIS — E785 Hyperlipidemia, unspecified: Secondary | ICD-10-CM | POA: Diagnosis present

## 2019-02-07 DIAGNOSIS — Z7901 Long term (current) use of anticoagulants: Secondary | ICD-10-CM

## 2019-02-07 DIAGNOSIS — Z801 Family history of malignant neoplasm of trachea, bronchus and lung: Secondary | ICD-10-CM

## 2019-02-07 DIAGNOSIS — E1142 Type 2 diabetes mellitus with diabetic polyneuropathy: Secondary | ICD-10-CM | POA: Diagnosis not present

## 2019-02-07 DIAGNOSIS — G9340 Encephalopathy, unspecified: Secondary | ICD-10-CM | POA: Diagnosis present

## 2019-02-07 DIAGNOSIS — I129 Hypertensive chronic kidney disease with stage 1 through stage 4 chronic kidney disease, or unspecified chronic kidney disease: Secondary | ICD-10-CM | POA: Diagnosis present

## 2019-02-07 DIAGNOSIS — R4182 Altered mental status, unspecified: Secondary | ICD-10-CM

## 2019-02-07 DIAGNOSIS — N183 Chronic kidney disease, stage 3 unspecified: Secondary | ICD-10-CM | POA: Diagnosis present

## 2019-02-07 LAB — CBC WITH DIFFERENTIAL/PLATELET
Abs Immature Granulocytes: 0.07 10*3/uL (ref 0.00–0.07)
Basophils Absolute: 0 10*3/uL (ref 0.0–0.1)
Basophils Relative: 0 %
Eosinophils Absolute: 0 10*3/uL (ref 0.0–0.5)
Eosinophils Relative: 0 %
HCT: 33.1 % — ABNORMAL LOW (ref 36.0–46.0)
Hemoglobin: 10.3 g/dL — ABNORMAL LOW (ref 12.0–15.0)
Immature Granulocytes: 1 %
Lymphocytes Relative: 5 %
Lymphs Abs: 0.6 10*3/uL — ABNORMAL LOW (ref 0.7–4.0)
MCH: 22.9 pg — ABNORMAL LOW (ref 26.0–34.0)
MCHC: 31.1 g/dL (ref 30.0–36.0)
MCV: 73.7 fL — ABNORMAL LOW (ref 80.0–100.0)
Monocytes Absolute: 0.6 10*3/uL (ref 0.1–1.0)
Monocytes Relative: 5 %
Neutro Abs: 12.1 10*3/uL — ABNORMAL HIGH (ref 1.7–7.7)
Neutrophils Relative %: 89 %
Platelets: 391 10*3/uL (ref 150–400)
RBC: 4.49 MIL/uL (ref 3.87–5.11)
RDW: 16.5 % — ABNORMAL HIGH (ref 11.5–15.5)
WBC: 13.5 10*3/uL — ABNORMAL HIGH (ref 4.0–10.5)
nRBC: 0.4 % — ABNORMAL HIGH (ref 0.0–0.2)

## 2019-02-07 LAB — URINE DRUG SCREEN, QUALITATIVE (ARMC ONLY)
Amphetamines, Ur Screen: NOT DETECTED
Barbiturates, Ur Screen: NOT DETECTED
Benzodiazepine, Ur Scrn: NOT DETECTED
Cannabinoid 50 Ng, Ur ~~LOC~~: NOT DETECTED
Cocaine Metabolite,Ur ~~LOC~~: NOT DETECTED
MDMA (Ecstasy)Ur Screen: NOT DETECTED
Methadone Scn, Ur: NOT DETECTED
Opiate, Ur Screen: NOT DETECTED
Phencyclidine (PCP) Ur S: NOT DETECTED
Tricyclic, Ur Screen: NOT DETECTED

## 2019-02-07 LAB — URINALYSIS, COMPLETE (UACMP) WITH MICROSCOPIC
Bacteria, UA: NONE SEEN
Bilirubin Urine: NEGATIVE
Glucose, UA: >=500 mg/dL — AB
Hgb urine dipstick: NEGATIVE
Ketones, ur: NEGATIVE mg/dL
Leukocytes,Ua: NEGATIVE
Nitrite: NEGATIVE
Protein, ur: 30 mg/dL — AB
Specific Gravity, Urine: 1.01 (ref 1.005–1.030)
pH: 5 (ref 5.0–8.0)

## 2019-02-07 LAB — GLUCOSE, CAPILLARY
Glucose-Capillary: 398 mg/dL — ABNORMAL HIGH (ref 70–99)
Glucose-Capillary: 221 mg/dL — ABNORMAL HIGH (ref 70–99)
Glucose-Capillary: 236 mg/dL — ABNORMAL HIGH (ref 70–99)
Glucose-Capillary: 105 mg/dL — ABNORMAL HIGH (ref 70–99)

## 2019-02-07 LAB — BASIC METABOLIC PANEL
Anion gap: 12 (ref 5–15)
BUN: 40 mg/dL — ABNORMAL HIGH (ref 8–23)
CO2: 17 mmol/L — ABNORMAL LOW (ref 22–32)
Calcium: 8.1 mg/dL — ABNORMAL LOW (ref 8.9–10.3)
Chloride: 105 mmol/L (ref 98–111)
Creatinine, Ser: 1.82 mg/dL — ABNORMAL HIGH (ref 0.44–1.00)
GFR calc Af Amer: 32 mL/min — ABNORMAL LOW (ref >60)
GFR calc non Af Amer: 28 mL/min — ABNORMAL LOW (ref >60)
Glucose, Bld: 368 mg/dL — ABNORMAL HIGH (ref 70–99)
Potassium: 6.5 mmol/L (ref 3.5–5.1)
Sodium: 134 mmol/L — ABNORMAL LOW (ref 135–145)

## 2019-02-07 LAB — PHOSPHORUS: Phosphorus: 4.3 mg/dL (ref 2.5–4.6)

## 2019-02-07 LAB — BETA-HYDROXYBUTYRIC ACID: Beta-Hydroxybutyric Acid: 0.09 mmol/L (ref 0.05–0.27)

## 2019-02-07 LAB — MAGNESIUM: Magnesium: 1.9 mg/dL (ref 1.7–2.4)

## 2019-02-07 LAB — POTASSIUM: Potassium: 4.6 mmol/L (ref 3.5–5.1)

## 2019-02-07 LAB — AMMONIA: Ammonia: 36 umol/L — ABNORMAL HIGH (ref 9–35)

## 2019-02-07 MED ORDER — ADULT MULTIVITAMIN W/MINERALS CH
1.0000 | ORAL_TABLET | Freq: Every day | ORAL | Status: DC
  Administered 2019-02-07 – 2019-02-12 (×5): 1 via ORAL
  Filled 2019-02-07 (×8): qty 1

## 2019-02-07 MED ORDER — POLYETHYLENE GLYCOL 3350 17 G PO PACK
17.0000 g | PACK | Freq: Every day | ORAL | Status: DC | PRN
  Filled 2019-02-07 (×2): qty 1

## 2019-02-07 MED ORDER — METOPROLOL TARTRATE 25 MG PO TABS
12.5000 mg | ORAL_TABLET | Freq: Two times a day (BID) | ORAL | Status: DC
  Administered 2019-02-07 – 2019-02-13 (×9): 12.5 mg via ORAL
  Filled 2019-02-07 (×2): qty 1
  Filled 2019-02-07: qty 0.5
  Filled 2019-02-07 (×10): qty 1

## 2019-02-07 MED ORDER — VITAMIN D 25 MCG (1000 UNIT) PO TABS
1000.0000 [IU] | ORAL_TABLET | Freq: Every day | ORAL | Status: DC
  Administered 2019-02-07 – 2019-02-12 (×5): 1000 [IU] via ORAL
  Filled 2019-02-07 (×8): qty 1

## 2019-02-07 MED ORDER — LORAZEPAM BOLUS VIA INFUSION: 1.0000 mg | INTRAVENOUS | Status: DC | PRN

## 2019-02-07 MED ORDER — LORATADINE 10 MG PO TABS
10.0000 mg | ORAL_TABLET | Freq: Every day | ORAL | Status: DC
  Administered 2019-02-07 – 2019-02-12 (×5): 10 mg via ORAL
  Filled 2019-02-07 (×8): qty 1

## 2019-02-07 MED ORDER — ASPIRIN EC 81 MG PO TBEC
81.0000 mg | DELAYED_RELEASE_TABLET | Freq: Every day | ORAL | Status: DC
  Administered 2019-02-07 – 2019-02-12 (×5): 81 mg via ORAL
  Filled 2019-02-07 (×8): qty 1

## 2019-02-07 MED ORDER — GABAPENTIN 400 MG PO CAPS
400.0000 mg | ORAL_CAPSULE | Freq: Three times a day (TID) | ORAL | Status: DC
  Administered 2019-02-07 – 2019-02-13 (×12): 400 mg via ORAL
  Filled 2019-02-07 (×2): qty 1
  Filled 2019-02-07: qty 4
  Filled 2019-02-07 (×4): qty 1
  Filled 2019-02-07: qty 4
  Filled 2019-02-07 (×3): qty 1
  Filled 2019-02-07: qty 4
  Filled 2019-02-07 (×5): qty 1
  Filled 2019-02-07 (×2): qty 4
  Filled 2019-02-07: qty 1
  Filled 2019-02-07: qty 4
  Filled 2019-02-07 (×4): qty 1
  Filled 2019-02-07: qty 4
  Filled 2019-02-07 (×2): qty 1
  Filled 2019-02-07: qty 4

## 2019-02-07 MED ORDER — MAGNESIUM HYDROXIDE 400 MG/5ML PO SUSP
30.0000 mL | Freq: Every day | ORAL | Status: DC | PRN
  Filled 2019-02-07: qty 30

## 2019-02-07 MED ORDER — ACETAMINOPHEN 650 MG RE SUPP: 650.0000 mg | Freq: Four times a day (QID) | RECTAL | Status: DC | PRN

## 2019-02-07 MED ORDER — NALOXONE HCL 2 MG/2ML IJ SOSY: 1.0000 mg | PREFILLED_SYRINGE | Freq: Once | INTRAMUSCULAR | Status: AC

## 2019-02-07 MED ORDER — FLUTICASONE PROPIONATE 50 MCG/ACT NA SUSP
2.0000 | Freq: Every day | NASAL | Status: DC | PRN
  Filled 2019-02-07: qty 16

## 2019-02-07 MED ORDER — NALOXONE HCL 2 MG/2ML IJ SOSY
PREFILLED_SYRINGE | INTRAMUSCULAR | Status: AC
  Administered 2019-02-07: 1 mg via INTRAVENOUS
  Filled 2019-02-07: qty 2

## 2019-02-07 MED ORDER — ACETAMINOPHEN 325 MG PO TABS: 650.0000 mg | ORAL_TABLET | Freq: Four times a day (QID) | ORAL | Status: DC | PRN

## 2019-02-07 MED ORDER — SODIUM CHLORIDE 0.9 % IV SOLN: INTRAVENOUS | Status: DC

## 2019-02-07 MED ORDER — SODIUM CHLORIDE 0.9 % IV BOLUS
1000.0000 mL | Freq: Once | INTRAVENOUS | Status: AC
  Administered 2019-02-07: 1000 mL via INTRAVENOUS

## 2019-02-07 MED ORDER — FERROUS SULFATE 325 (65 FE) MG PO TABS
325.0000 mg | ORAL_TABLET | Freq: Two times a day (BID) | ORAL | Status: DC
  Administered 2019-02-07 – 2019-02-12 (×8): 325 mg via ORAL
  Filled 2019-02-07 (×11): qty 1

## 2019-02-07 MED ORDER — LORAZEPAM 2 MG/ML IJ SOLN: 1.0000 mg | INTRAMUSCULAR | Status: DC | PRN

## 2019-02-07 MED ORDER — INSULIN GLARGINE 100 UNIT/ML ~~LOC~~ SOLN
25.0000 [IU] | Freq: Every day | SUBCUTANEOUS | Status: DC
  Filled 2019-02-07: qty 0.25

## 2019-02-07 MED ORDER — IPRATROPIUM-ALBUTEROL 0.5-2.5 (3) MG/3ML IN SOLN
3.0000 mL | Freq: Four times a day (QID) | RESPIRATORY_TRACT | Status: DC | PRN
  Filled 2019-02-07: qty 3

## 2019-02-07 MED ORDER — ROSUVASTATIN CALCIUM 20 MG PO TABS
20.0000 mg | ORAL_TABLET | Freq: Every day | ORAL | Status: DC
  Administered 2019-02-07 – 2019-02-13 (×6): 20 mg via ORAL
  Filled 2019-02-07 (×7): qty 1

## 2019-02-07 MED ORDER — ALBUTEROL SULFATE (2.5 MG/3ML) 0.083% IN NEBU: 2.5000 mg | INHALATION_SOLUTION | Freq: Four times a day (QID) | RESPIRATORY_TRACT | Status: DC | PRN

## 2019-02-07 MED ORDER — APIXABAN 5 MG PO TABS
5.0000 mg | ORAL_TABLET | Freq: Two times a day (BID) | ORAL | Status: DC
  Administered 2019-02-07 – 2019-02-13 (×9): 5 mg via ORAL
  Filled 2019-02-07 (×13): qty 1

## 2019-02-07 MED ORDER — INSULIN ASPART 100 UNIT/ML ~~LOC~~ SOLN
0.0000 [IU] | Freq: Three times a day (TID) | SUBCUTANEOUS | Status: DC
  Administered 2019-02-07 (×2): 3 [IU] via SUBCUTANEOUS
  Administered 2019-02-08: 2 [IU] via SUBCUTANEOUS
  Administered 2019-02-08 – 2019-02-09 (×2): 3 [IU] via SUBCUTANEOUS
  Administered 2019-02-09: 5 [IU] via SUBCUTANEOUS
  Administered 2019-02-09: 7 [IU] via SUBCUTANEOUS
  Administered 2019-02-10: 2 [IU] via SUBCUTANEOUS
  Administered 2019-02-10: 12:00:00 3 [IU] via SUBCUTANEOUS
  Administered 2019-02-10: 09:00:00 7 [IU] via SUBCUTANEOUS
  Administered 2019-02-11: 12:00:00 1 [IU] via SUBCUTANEOUS
  Administered 2019-02-11: 3 [IU] via SUBCUTANEOUS
  Administered 2019-02-12: 09:00:00 2 [IU] via SUBCUTANEOUS
  Administered 2019-02-12: 18:00:00 1 [IU] via SUBCUTANEOUS
  Administered 2019-02-12: 3 [IU] via SUBCUTANEOUS
  Administered 2019-02-13: 09:00:00 7 [IU] via SUBCUTANEOUS
  Filled 2019-02-07 (×14): qty 1

## 2019-02-07 NOTE — H&P (Signed)
[place] at [place] NAME: Audrey Chan    MR#:  [id number]  DATE OF BIRTH:  08/24/1950  DATE OF ADMISSION:  02/07/2019  PRIMARY CARE PHYSICIAN: Alene Mires Elyse Jarvis, MD   REQUESTING/REFERRING PHYSICIAN: Marjean Donna, MD  CHIEF COMPLAINT:   Chief Complaint  Patient presents with  . Hyperglycemia  . Altered Mental Status    HISTORY OF PRESENT ILLNESS:  Audrey Chan  is a 68 y.o. female with a known history of multiple medical problems that are mentioned below including recent PE with subsequent acute on chronic hypoxic respiratory failure for which she was admitted here on 12/14 and discharged on 12/17.  The patient underwent pulmonary thrombectomy on 12 2 by Dr. Lucky Cowboy.  She has been noted to have altered mental status last night by his sister with acute confusion and grunting with nonsensical speech.  She checked her blood glucose and it was elevated.  The patient herself is telling me that she has been having hypoglycemia with blood glucose in the 40s to 50s.  She was not witnessed to have seizures.  She denies any chest pain or dyspnea or palpitations.  No cough or wheezing or hemoptysis.  She has been having polyuria and polydipsia.  Upon presentation to the ER vital signs revealed blood pressure of 157/89 with pulse symmetry of 99% on her percent nonrebreather and later on 95% on nasal cannula.  Labs revealed mild hyponatremia and a CO2 of 17 with blood glucose of 368, BUN of 40 and creatinine 1.82 with ammonia level of 36 anion gap of 12.  Potassium was initially 6.5 and was thought to be embolized.  Repeat potassium was 4.6.  CBC showed leukocytosis of 13.5 with neutrophilia, and anemia.  Urinalysis showed more than 500 glucose and urine drug screen came back negative.  Head CT scan revealed unchanged atrophy and chronic ischemia with no acute intracranial abnormalities.  She was initially very confused in the ER and was given 1 mg of IV Narcan and  2 L bolus of IV normal saline.  There was not any initial response to such treatment.  Later on the patient came back to her baseline mental status.  She will be admitted to a medical monitored bed for further evaluation and management.  PAST MEDICAL HISTORY:   Past Medical History:  Diagnosis Date  . Allergic rhinitis   . Aneurysm of unspecified site ([place])   . Cholelithiasis   . Diabetes type 2, controlled ([place])   . GERD (gastroesophageal reflux disease)   . Hypertension   . Leg DVT (deep venous thromboembolism), acute, bilateral ([place]) 01/21/2019  . Multiple subsegmental pulmonary emboli without acute cor pulmonale ([place]) 01/20/2019  . Neuropathy   . Veno-occlusive disease     PAST SURGICAL HISTORY:   Past Surgical History:  Procedure Laterality Date  . ABDOMINAL HYSTERECTOMY  1972  . CERVICAL FUSION    . CHOLECYSTECTOMY N/A 12/28/2014   Procedure: LAPAROSCOPIC CHOLECYSTECTOMY;  Surgeon: Sherri Rad, MD;  Location: [facility] ORS;  Service: General;  Laterality: N/A;  . EYE SURGERY     detached retina  . MASS EXCISION Left 05/06/2018   Procedure: EXCISION OF LEFT ANTECUBITAL CYST, DIABETIC;  Surgeon: Vickie Epley, MD;  Location: [facility] ORS;  Service: Vascular;  Laterality: Left;  . PULMONARY THROMBECTOMY Bilateral 01/22/2019   Procedure: PULMONARY THROMBECTOMY;  Surgeon: Algernon Huxley, MD;  Location: [place] CV LAB;  Service: Cardiovascular;  Laterality: Bilateral;    SOCIAL HISTORY:  Social History   Tobacco Use  . Smoking status: Former Smoker    Quit date: 02/21/1999    Years since quitting: 19.9  . Smokeless tobacco: Never Used  Substance Use Topics  . Alcohol use: No    Alcohol/week: 0.0 standard drinks    FAMILY HISTORY:   Family History  Problem Relation Age of Onset  . Lung cancer Father     DRUG ALLERGIES:  No Known Allergies  REVIEW OF SYSTEMS:   ROS As per history of present illness. All pertinent systems were reviewed above. Constitutional,  HEENT,  cardiovascular, respiratory, GI, GU, musculoskeletal, neuro, psychiatric, endocrine,  integumentary and hematologic systems were reviewed and are otherwise  negative/unremarkable except for positive findings mentioned above in the HPI.   MEDICATIONS AT HOME:   Prior to Admission medications   Medication Sig Start Date End Date Taking? Authorizing Provider  acetaminophen (TYLENOL) 500 MG tablet Take 500-1,000 mg by mouth every 6 (six) hours as needed for moderate pain.    Yes [provider]  albuterol (PROVENTIL HFA;VENTOLIN HFA) 108 (90 BASE) MCG/ACT inhaler Inhale 2 puffs into the lungs every 6 (six) hours as needed for wheezing or shortness of breath.    Yes [provider]  apixaban (ELIQUIS) 5 MG TABS tablet Take 1 tablet (5 mg total) by mouth 2 (two) times daily. 01/30/19  Yes [name], MD  cetirizine (ZYRTEC) 10 MG tablet Take 10 mg by mouth daily.    Yes [provider]  Cholecalciferol (VITAMIN D3) 1000 units CAPS Take 1,000 Units by mouth daily.    Yes [provider]  ferrous sulfate 325 (65 FE) MG tablet Take 325 mg by mouth 2 (two) times daily with a meal.    Yes [provider]  fluticasone (FLONASE) 50 MCG/ACT nasal spray Place 2 sprays into both nostrils daily as needed for allergies or rhinitis.    Yes [provider]  gabapentin (NEURONTIN) 800 MG tablet Take 0.5 tablets (400 mg total) by mouth 3 (three) times daily. 02/06/19  Yes Thornell Mule, MD  insulin aspart (NOVOLOG) 100 UNIT/ML injection Check blood sugar prior to meals and give insulin on the following scale: Blood sugar less than 150: 0 units BS 150-200: 2 units BS 200-250: 3 units BS 250-300: 4 units BS 300-350: 5 units BS 350-400: 6 units BS above 400: 8 units and call your doctor. 01/07/19  Yes Carrie Mew, MD  ipratropium-albuterol (DUONEB) 0.5-2.5 (3) MG/3ML SOLN Take 3 mLs by nebulization every 6 (six) hours as needed. Patient taking  differently: Take 3 mLs by nebulization every 6 (six) hours as needed (wheezing/ shortness of breath).  01/25/19  Yes [name], MD  LANTUS SOLOSTAR 100 UNIT/ML Solostar Pen Inject 25 Units into the skin at bedtime. 01/07/19  Yes Carrie Mew, MD  lisinopril (ZESTRIL) 10 MG tablet Take 1 tablet (10 mg total) by mouth daily. 01/25/19  Yes [name], MD  metoprolol tartrate (LOPRESSOR) 25 MG tablet Take 12.5 mg by mouth 2 (two) times daily.   Yes [provider]  mometasone (ELOCON) 0.1 % cream Apply 1 application topically 2 (two) times daily as needed (rash).  10/08/17  Yes [provider]  Multiple Vitamin (MULTI-VITAMINS) TABS Take 1 tablet by mouth daily.   Yes [provider]  ondansetron (ZOFRAN) 8 MG tablet One pill every 8 hours as needed for nausea/vomitting. 01/30/19  Yes [name], MD  rosuvastatin (CRESTOR) 20 MG tablet Take 20 mg by mouth  daily.  05/02/13  Yes [provider]  polyethylene glycol (MIRALAX / GLYCOLAX) 17 g packet Take 17 g by mouth daily as needed for moderate constipation.  10/08/15   [provider]  umeclidinium-vilanterol (ANORO ELLIPTA) 62.5-25 MCG/INH AEPB Inhale 1 puff into the lungs daily as needed (shortness of breath).     [provider]      VITAL SIGNS:  Blood pressure 137/85, pulse 78, temperature 97.9 F (36.6 C), temperature source Oral, resp. rate 13, height 6' (1.829 m), weight 60.3 kg, SpO2 95 %.  PHYSICAL EXAMINATION:  Physical Exam  GENERAL:  68 y.o.-year-old African-American female patient lying in the bed with no acute distress.  During my interview she was alert oriented x3 and cooperative. EYES: Pupils equal, round, reactive to light and accommodation. No scleral icterus. Extraocular muscles intact.  HEENT: Head atraumatic, normocephalic. Oropharynx and nasopharynx clear.  NECK:  Supple, no jugular venous distention. No thyroid enlargement, no tenderness.  LUNGS: Normal  breath sounds bilaterally, no wheezing, rales,rhonchi or crepitation. No use of accessory muscles of respiration.  CARDIOVASCULAR: Regular rate and rhythm, S1, S2 normal. No murmurs, rubs, or gallops.  ABDOMEN: Soft, nondistended, nontender. Bowel sounds present. No organomegaly or mass.  EXTREMITIES: No pedal edema, cyanosis, or clubbing.  NEUROLOGIC: Cranial nerves II through XII are intact. Muscle strength 5/5 in all extremities. Sensation intact. Gait not checked.  PSYCHIATRIC: The patient is alert and oriented x 3.  Normal affect and good eye contact. SKIN: No obvious rash, lesion, or ulcer.   LABORATORY PANEL:   CBC Recent Labs  Lab 02/07/19 0414  WBC 13.5*  HGB 10.3*  HCT 33.1*  PLT 391   ------------------------------------------------------------------------------------------------------------------  Chemistries  Recent Labs  Lab 02/02/19 1929 02/07/19 0414 02/07/19 0536  NA 135 134*  --   K 3.9 6.5* 4.6  CL 104 105  --   CO2 18* 17*  --   GLUCOSE 238* 368*  --   BUN 17 40*  --   CREATININE 1.24* 1.82*  --   CALCIUM 9.0 8.1*  --   AST 130*  --   --   ALT 92*  --   --   ALKPHOS 1,819*  --   --   BILITOT 1.0  --   --    ------------------------------------------------------------------------------------------------------------------  Cardiac Enzymes No results for input(s): TROPONINI in the last 168 hours. ------------------------------------------------------------------------------------------------------------------  RADIOLOGY:  CT Head Wo Contrast  Result Date: 02/07/2019 CLINICAL DATA:  Altered mental status. Recent diagnosis of liver metastasis. EXAM: CT HEAD WITHOUT CONTRAST TECHNIQUE: Contiguous axial images were obtained from the base of the skull through the vertex without intravenous contrast. COMPARISON:  Head CT 5 days ago 02/02/2019. Brain MRI 01/01/2019 FINDINGS: Brain: No intracranial hemorrhage, mass effect, or midline shift. Stable brain  volume. No hydrocephalus. The basilar cisterns are patent. Unchanged chronic small vessel ischemia. Remote left cerebellar infarct. Small additional cerebellar infarct on MRI last month is not well seen by CT. No evidence of territorial infarct or acute ischemia. No extra-axial or intracranial fluid collection. Vascular: No hyperdense vessel. Skull: No fracture or focal lesion. Sinuses/Orbits: Paranasal sinuses and mastoid air cells are clear. The visualized orbits are unremarkable. Other: None. IMPRESSION: 1. No acute intracranial abnormality. 2. Unchanged atrophy and chronic ischemia. Electronically Signed   By: Keith Rake M.D.   On: 02/07/2019 03:54   DG Chest Portable 1 View  Result Date: 02/07/2019 CLINICAL DATA:  Hypoxia. EXAM: PORTABLE CHEST 1 VIEW COMPARISON:  Chest radiograph  and CTA 5 days ago 02/02/2019 FINDINGS: Lower lung volumes from prior exam. Emphysema. Small pulmonary nodules on prior CT are not well visualized radiographically. No pulmonary edema, focal airspace disease, pleural effusion or pneumothorax. No acute osseous abnormalities are seen. IMPRESSION: 1. Emphysema without acute abnormality. 2. Small pulmonary nodules on prior CT are not well visualized radiographically. Electronically Signed   By: Keith Rake M.D.   On: 02/07/2019 03:44      IMPRESSION AND PLAN:   1.  Acute encephalopathy with differential diagnosis including seizure possibly secondary to symptomatic hypoglycemia as well as TIA.  The patient will be admitted to a medical monitored bed.  She will be followed with neuro checks every 4 hours for 24 hours.  Will obtain an EEG to assess for possible seizures and monitor for hypoglycemia.  We will check her hemoglobin A1c.  She could be having labile type 2 diabetes mellitus.  We will hold off on Lantus for now.  Neurology consultation will be obtained.  Brain MRI as well as bilateral carotid Doppler will be obtained she had a 2D echo on 12/14 revealing EF 55  to 60% with grade 1 diastolic dysfunction  2.  Recent acute PE status post thrombectomy.  We will continue her Eliquis.  3.  Dyslipidemia.  Statin therapy will be resumed.  4.  Type 2 diabetes mellitus.  The patient will be placed on supplemental coverage with NovoLog and will hold off her Lantus for now.  5.  Peripheral neuropathy.  Neurontin will be resumed.  6.  DVT prophylaxis.  We will continue p.o. Eliquis.  All the records are reviewed and case discussed with ED provider. The plan of care was discussed in details with the patient (and family). I answered all questions. The patient agreed to proceed with the above mentioned plan. Further management will depend upon hospital course.   CODE STATUS: Full code  TOTAL TIME TAKING CARE OF THIS PATIENT: 55 minutes.    Christel Mormon M.D on 02/07/2019 at 10:07 AM  [facility]   From 7 PM-7 AM, contact night-coverage [email or web address]  CC: Primary care physician; [place], Elyse Jarvis, MD   Note: This dictation was prepared with Dragon dictation along with smaller phrase technology. Any transcriptional errors that result from this process are unintentional.

## 2019-02-07 NOTE — Procedures (Signed)
ELECTROENCEPHALOGRAM REPORT   Patient: Audrey Chan       Room #: [id number] EEG No. ID: [id number] Age: 68 y.o.        Sex: female Referring Physician: Mansy Report Date:  02/07/2019        Interpreting Physician: Alexis Goodell  History: Audrey Chan is an 68 y.o. female with altered mental status  Medications:  Eliquis, ASA, Neurontin, Insulin, Claritin. Lopressor, Crestor  Conditions of Recording:  This is a 21 channel routine scalp EEG performed with bipolar and monopolar montages arranged in accordance to the international 10/20 system of electrode placement. One channel was dedicated to EKG recording.  The patient is in the awake and drowsy states.  Description:  The background activity is slow and poorly organized.  It is dominated by a low voltage theta activity that is diffusely distributed. Some intermittent underlying delta activity is noted at times as well.   No epileptiform activity is noted.   Hyperventilation was not performed.  Intermittent photic stimulation was performed but failed to illicit any change in the tracing.    IMPRESSION: This is an abnormal EEG secondary to general background slowing.  This finding may be seen with a diffuse disturbance that is etiologically nonspecific, but may include a metabolic encephalopathy, among other possibilities.  No epileptiform activity was noted.     Alexis Goodell, MD Neurology [phone] 02/07/2019, 3:52 PM

## 2019-02-07 NOTE — ED Notes (Signed)
Pt remains asleep. Unlabored. NAD

## 2019-02-07 NOTE — ED Notes (Signed)
Patient transported to Ultrasound 

## 2019-02-07 NOTE — Progress Notes (Signed)
Inpatient Diabetes Program Recommendations  AACE/ADA: New Consensus Statement on Inpatient Glycemic Control (2015)  Target Ranges:  Prepandial:   less than 140 mg/dL      Peak postprandial:   less than 180 mg/dL (1-2 hours)      Critically ill patients:  140 - 180 mg/dL   Lab Results  Component Value Date   GLUCAP 221 (H) 02/07/2019   HGBA1C 8.9 (H) 02/03/2019    Review of Glycemic Control  Results for LILLI, POLCZYNSKI (MRN [id number]) as of 02/07/2019 14:28  Ref. Range 02/06/2019 07:24 02/06/2019 11:47 02/07/2019 03:04 02/07/2019 13:16  Glucose-Capillary Latest Ref Range: 70 - 99 mg/dL 150 (H) 156 (H) 398 (H) 221 (H)  NOVOLOG  3units    Diabetes history: DM2 Outpatient Diabetes medications: Lantus 25mg  daily; Novolog SSI Current orders for Inpatient glycemic control: Insulin 0-9 units TID  Inpatient Diabetes Program Recommendations:     -Please consider Lantus 12 units (50% of home dose).  Note: Spoke with patient at bedside.  She was pleasantly confused and couldn't remember her PCP or which medications she was on.  Asked permission to call her sister.    Spoke with sister, Ms. Williams on the phone.  Reviewed patient's current A1c of 9.7%. Explained what a A1c is and what it measures. Also reviewed goal A1c with patient, importance of good glucose control @ home, and blood sugar goals.  She recently saw Dr. Gabriel Carina in early December.  Sister helps with insulin administration at home.  She states when they got home yesterday they were out of strips.  Patient was hungry so she let her eats.  She ran out to get [employer] strips and when she got back and checked her BS she was in the 300's.  She gave 8 units and checked again later and it was in the 400's.  She then called the ED and they instructed her to give her basal.  Sister checked BS again and her BS was in the 500's and patient was not responding well so called EMS.    Denies difficulties obtaining medications or supplies.   Does not drink any sugary drinks.  She drinks 1-3 Glucerna's per day.  Will continue to follow.    Thank you, Geoffry Paradise, RN, BSN Diabetes Coordinator Inpatient Diabetes Program [phone] (team pager from 8a-5p)

## 2019-02-07 NOTE — Plan of Care (Signed)
  Problem: Education: Goal: Knowledge of [facility] Education information/materials will improve Outcome: Progressing Goal: Emotional status will improve Outcome: Progressing Goal: Mental status will improve Outcome: Progressing Goal: Verbalization of understanding the information provided will improve Outcome: Progressing   

## 2019-02-07 NOTE — ED Triage Notes (Signed)
Pt arrives to ED from home via [facility] with c/c of hyperglycemia and altered mental status. Pt has Hx of stage 4 liver cancer, renal failure. Pt family reports that she was moaning and became less verbal as the evening progressed. EMS reports transport vitals of 138/83, p100, 96% on 6L via nasal cannula, CBG 493. Upon arrival, pt unresponsive. Arouses to pain (sternal rub) but is still non verbal. Pts sister arrives and is at bedside. Sister states that she is compliant with her diabetic sugar monitoring and scueduled insulin. Dr Owens Shark at bedside.

## 2019-02-07 NOTE — Consult Note (Signed)
Referring Physician: Mansy    Chief Complaint: AMS  HPI: Audrey Chan is an 68 y.o. female with a history of multiple medical problems that are mentioned below including recent PE on Eliquis with subsequent acute on chronic hypoxic respiratory failure for which she was admitted here on 12/14 and discharged on 12/17.  The patient underwent pulmonary thrombectomy on 12/2 by Dr. Lucky Cowboy.  Sister reports she has been altered for the past week but it seemed to be worse on yesterday.  Attempted to go to the bathroom in a drawer and later on the bed.  Last night with grunting with nonsensical speech.  Blood sugars were low.  Patient brought in for further evaluation. initial NIHSS of 2.    Date last known well: Unable to determine Time last known well: Unable to determine tPA Given: No: On Eliquis, unable to determine LKW  Past Medical History:  Diagnosis Date  . Allergic rhinitis   . Aneurysm of unspecified site ([place])   . Cholelithiasis   . Diabetes type 2, controlled ([place])   . GERD (gastroesophageal reflux disease)   . Hypertension   . Leg DVT (deep venous thromboembolism), acute, bilateral ([place]) 01/21/2019  . Multiple subsegmental pulmonary emboli without acute cor pulmonale ([place]) 01/20/2019  . Neuropathy   . Veno-occlusive disease     Past Surgical History:  Procedure Laterality Date  . ABDOMINAL HYSTERECTOMY  1972  . CERVICAL FUSION    . CHOLECYSTECTOMY N/A 12/28/2014   Procedure: LAPAROSCOPIC CHOLECYSTECTOMY;  Surgeon: Sherri Rad, MD;  Location: [facility] ORS;  Service: General;  Laterality: N/A;  . EYE SURGERY     detached retina  . MASS EXCISION Left 05/06/2018   Procedure: EXCISION OF LEFT ANTECUBITAL CYST, DIABETIC;  Surgeon: Vickie Epley, MD;  Location: [facility] ORS;  Service: Vascular;  Laterality: Left;  . PULMONARY THROMBECTOMY Bilateral 01/22/2019   Procedure: PULMONARY THROMBECTOMY;  Surgeon: Algernon Huxley, MD;  Location: [facility];  Service: Cardiovascular;   Laterality: Bilateral;    Family History  Problem Relation Age of Onset  . Lung cancer Father    Social History:  reports that she quit smoking about 19 years ago. She has never used smokeless tobacco. She reports that she does not drink alcohol or use drugs.  Allergies: No Known Allergies  Medications:  I have reviewed the patient's current medications. Prior to Admission:  Prior to Admission medications   Medication Sig Start Date End Date Taking? Authorizing Provider  acetaminophen (TYLENOL) 500 MG tablet Take 500-1,000 mg by mouth every 6 (six) hours as needed for moderate pain.    Yes [provider]  albuterol (PROVENTIL HFA;VENTOLIN HFA) 108 (90 BASE) MCG/ACT inhaler Inhale 2 puffs into the lungs every 6 (six) hours as needed for wheezing or shortness of breath.    Yes [provider]  apixaban (ELIQUIS) 5 MG TABS tablet Take 1 tablet (5 mg total) by mouth 2 (two) times daily. 01/30/19  Yes [name], MD  cetirizine (ZYRTEC) 10 MG tablet Take 10 mg by mouth daily.    Yes [provider]  Cholecalciferol (VITAMIN D3) 1000 units CAPS Take 1,000 Units by mouth daily.    Yes [provider]  ferrous sulfate 325 (65 FE) MG tablet Take 325 mg by mouth 2 (two) times daily with a meal.    Yes [provider]  fluticasone (FLONASE) 50 MCG/ACT nasal spray Place 2 sprays into both nostrils daily as needed for allergies or rhinitis.  Yes [provider]  gabapentin (NEURONTIN) 800 MG tablet Take 0.5 tablets (400 mg total) by mouth 3 (three) times daily. 02/06/19  Yes Thornell Mule, MD  insulin aspart (NOVOLOG) 100 UNIT/ML injection Check blood sugar prior to meals and give insulin on the following scale: Blood sugar less than 150: 0 units BS 150-200: 2 units BS 200-250: 3 units BS 250-300: 4 units BS 300-350: 5 units BS 350-400: 6 units BS above 400: 8 units and call your doctor. 01/07/19  Yes Carrie Mew, MD   ipratropium-albuterol (DUONEB) 0.5-2.5 (3) MG/3ML SOLN Take 3 mLs by nebulization every 6 (six) hours as needed. Patient taking differently: Take 3 mLs by nebulization every 6 (six) hours as needed (wheezing/ shortness of breath).  01/25/19  Yes [name], MD  LANTUS SOLOSTAR 100 UNIT/ML Solostar Pen Inject 25 Units into the skin at bedtime. 01/07/19  Yes Carrie Mew, MD  lisinopril (ZESTRIL) 10 MG tablet Take 1 tablet (10 mg total) by mouth daily. 01/25/19  Yes [name], MD  metoprolol tartrate (LOPRESSOR) 25 MG tablet Take 12.5 mg by mouth 2 (two) times daily.   Yes [provider]  mometasone (ELOCON) 0.1 % cream Apply 1 application topically 2 (two) times daily as needed (rash).  10/08/17  Yes [provider]  Multiple Vitamin (MULTI-VITAMINS) TABS Take 1 tablet by mouth daily.   Yes [provider]  ondansetron (ZOFRAN) 8 MG tablet One pill every 8 hours as needed for nausea/vomitting. 01/30/19  Yes [name], MD  rosuvastatin (CRESTOR) 20 MG tablet Take 20 mg by mouth daily.  05/02/13  Yes [provider]  polyethylene glycol (MIRALAX / GLYCOLAX) 17 g packet Take 17 g by mouth daily as needed for moderate constipation.  10/08/15   [provider]  umeclidinium-vilanterol (ANORO ELLIPTA) 62.5-25 MCG/INH AEPB Inhale 1 puff into the lungs daily as needed (shortness of breath).     [provider]     Scheduled: . apixaban  5 mg Oral BID  . aspirin EC  81 mg Oral Daily  . cholecalciferol  1,000 Units Oral Daily  . ferrous sulfate  325 mg Oral BID WC  . gabapentin  400 mg Oral TID  . insulin aspart  0-9 Units Subcutaneous TID WC  . loratadine  10 mg Oral Daily  . metoprolol tartrate  12.5 mg Oral BID  . multivitamin with minerals  1 tablet Oral Daily  . rosuvastatin  20 mg Oral Daily    ROS: Unable to provide due to confusion  Physical Examination: Blood pressure 130/77, pulse 75, temperature 97.9 F (36.6 C),  temperature source Oral, resp. rate 15, height 6' (1.829 m), weight 60.3 kg, SpO2 100 %.  HEENT-  Normocephalic, no lesions, without obvious abnormality.  Normal external eye and conjunctiva.  Normal TM's bilaterally.  Normal auditory canals and external ears. Normal external nose, mucus membranes and septum.  Normal pharynx. Cardiovascular- S1, S2 normal, pulses palpable throughout   Lungs- chest clear, no wheezing, rales, normal symmetric air entry Abdomen- soft, non-tender; bowel sounds normal; no masses,  no organomegaly Extremities- no edema Lymph-no adenopathy palpable Musculoskeletal-no joint tenderness, deformity or swelling Skin-warm and dry, no hyperpigmentation, vitiligo, or suspicious lesions  Neurological Examination  Mental Status: Alert to place and name.  Reports it is 28 and she is 68 years old.  Speech fluent without evidence of aphasia.  Able to follow 3 step commands with some mild reinforcement. Cranial Nerves: II: Visual fields grossly normal, pupils  equal, round, reactive to light and accommodation III,IV, VI: left ptosis, extra-ocular motions intact bilaterally V,VII: smile symmetric, facial light touch sensation normal bilaterally VIII: hearing normal bilaterally IX,X: gag reflex present XI: bilateral shoulder shrug XII: midline tongue extension Motor: Right : Upper extremity   5/5    Left:     Upper extremity   5/5  Lower extremity   5/5     Lower extremity   5/5 Tone and bulk:normal tone throughout; no atrophy noted Sensory: Pinprick and light touch intact throughout, bilaterally Deep Tendon Reflexes: Symmetric throughout Plantars: Right: upgoing   Left: downgoing Cerebellar: Normal finger-to-nose and normal heel-to-shin testing bilaterally Gait: not tested due to safety concerns    Laboratory Studies:  Basic Metabolic Panel: Recent Labs  Lab 02/04/19 0446 02/05/19 0612 02/05/19 2123 02/06/19 0625 02/07/19 0414 02/07/19 0536  NA 141 140 133*  136 134*  --   K 4.0 4.2 5.4* 4.5 6.5* 4.6  CL 108 108 103 106 105  --   CO2 20* 24 18* 18* 17*  --   GLUCOSE 212* 67* 556* 194* 368*  --   BUN 21 26* 29* 30* 40*  --   CREATININE 1.40* 1.43* 1.71* 1.55* 1.82*  --   CALCIUM 9.4 9.2 8.7* 8.8* 8.1*  --   MG  --   --   --   --   --  1.9  PHOS  --   --   --   --   --  4.3    Liver Function Tests: Recent Labs  Lab 02/02/19 1929  AST 130*  ALT 92*  ALKPHOS 1,819*  BILITOT 1.0  PROT 7.4  ALBUMIN 3.2*   No results for input(s): LIPASE, AMYLASE in the last 168 hours. Recent Labs  Lab 02/03/19 1057 02/07/19 0414  AMMONIA 19 36*    CBC: Recent Labs  Lab 02/02/19 1929 02/03/19 0454 02/04/19 0446 02/07/19 0414  WBC 10.8* 9.8 7.5 13.5*  NEUTROABS 7.8*  --   --  12.1*  HGB 9.6* 9.7* 10.1* 10.3*  HCT 28.8* 28.8* 30.0* 33.1*  MCV 67.4* 67.4* 67.9* 73.7*  PLT 374 376 407* 391    Cardiac Enzymes: No results for input(s): CKTOTAL, CKMB, CKMBINDEX, TROPONINI in the last 168 hours.  BNP: Invalid input(s): POCBNP  CBG: Recent Labs  Lab 02/06/19 0404 02/06/19 0724 02/06/19 1147 02/07/19 0304 02/07/19 1316  GLUCAP 312* 150* 156* 398* 221*    Microbiology: Results for orders placed or performed during the hospital encounter of 02/02/19  SARS CORONAVIRUS 2 (TAT 6-24 HRS) Nasopharyngeal Nasopharyngeal Swab     Status: None   Collection Time: 02/02/19  9:30 PM   Specimen: Nasopharyngeal Swab  Result Value Ref Range Status   SARS Coronavirus 2 NEGATIVE NEGATIVE Final    Comment: (NOTE) SARS-CoV-2 target nucleic acids are NOT DETECTED. The SARS-CoV-2 RNA is generally detectable in upper and lower respiratory specimens during the acute phase of infection. Negative results do not preclude SARS-CoV-2 infection, do not rule out co-infections with other pathogens, and should not be used as the sole basis for treatment or other patient management decisions. Negative results must be combined with clinical observations, patient  history, and epidemiological information. The expected result is Negative. Fact Sheet for Patients: SugarRoll.be Fact Sheet for Healthcare Providers: https://www.woods-mathews.com/ This test is not yet approved or cleared by the [place] FDA and  has been authorized for detection and/or diagnosis of SARS-CoV-2 by FDA under an Emergency Use Authorization (EUA). This EUA  will remain  in effect (meaning this test can be used) for the duration of the COVID-19 declaration under Section 56 4(b)(1) of the Act, 21 U.S.C. section 360bbb-3(b)(1), unless the authorization is terminated or revoked sooner. Performed at [facility], [place] [address], [place], [place] [address]     Coagulation Studies: No results for input(s): LABPROT, INR in the last 72 hours.  Urinalysis:  Recent Labs  Lab 02/05/19 1141 02/07/19 0536  COLORURINE YELLOW* STRAW*  LABSPEC 1.019 1.010  PHURINE 5.0 5.0  GLUCOSEU 150* >=500*  HGBUR NEGATIVE NEGATIVE  BILIRUBINUR NEGATIVE NEGATIVE  KETONESUR NEGATIVE NEGATIVE  PROTEINUR 100* 30*  NITRITE NEGATIVE NEGATIVE  LEUKOCYTESUR NEGATIVE NEGATIVE    Lipid Panel:    Component Value Date/Time   TRIG 136 01/19/2019 2325    HgbA1C:  Lab Results  Component Value Date   HGBA1C 8.9 (H) 02/03/2019    Urine Drug Screen:      Component Value Date/Time   LABOPIA NONE DETECTED 02/07/2019 0536   COCAINSCRNUR NONE DETECTED 02/07/2019 0536   LABBENZ NONE DETECTED 02/07/2019 0536   AMPHETMU NONE DETECTED 02/07/2019 0536   THCU NONE DETECTED 02/07/2019 0536   LABBARB NONE DETECTED 02/07/2019 0536    Alcohol Level: No results for input(s): ETH in the last 168 hours.  Other results: EKG (02/02/2019): sinus rhythm at 92 bpm.  Imaging: CT Head Wo Contrast  Result Date: 02/07/2019 CLINICAL DATA:  Altered mental status. Recent diagnosis of liver metastasis. EXAM: CT HEAD WITHOUT CONTRAST TECHNIQUE: Contiguous axial  images were obtained from the base of the skull through the vertex without intravenous contrast. COMPARISON:  Head CT 5 days ago 02/02/2019. Brain MRI 01/01/2019 FINDINGS: Brain: No intracranial hemorrhage, mass effect, or midline shift. Stable brain volume. No hydrocephalus. The basilar cisterns are patent. Unchanged chronic small vessel ischemia. Remote left cerebellar infarct. Small additional cerebellar infarct on MRI last month is not well seen by CT. No evidence of territorial infarct or acute ischemia. No extra-axial or intracranial fluid collection. Vascular: No hyperdense vessel. Skull: No fracture or focal lesion. Sinuses/Orbits: Paranasal sinuses and mastoid air cells are clear. The visualized orbits are unremarkable. Other: None. IMPRESSION: 1. No acute intracranial abnormality. 2. Unchanged atrophy and chronic ischemia. Electronically Signed   By: Keith Rake M.D.   On: 02/07/2019 03:54   MR BRAIN WO CONTRAST  Result Date: 02/07/2019 CLINICAL DATA:  TIA.  Renal failure and stage IV liver cancer. EXAM: MRI HEAD WITHOUT CONTRAST TECHNIQUE: Multiplanar, multiecho pulse sequences of the brain and surrounding structures were obtained without intravenous contrast. COMPARISON:  MRI head 01/01/2019.  CT head 02/07/2019 FINDINGS: Brain: Multiple small areas of acute infarct throughout both cerebellar hemispheres. Small areas of acute/subacute infarct in both cerebral hemispheres involving the frontal and parietal lobes. Extensive chronic microvascular ischemic change throughout the cerebral white matter bilaterally. Chronic infarcts in the cerebellum on the left. Small areas of hemorrhage in the right temporal and parietal lobe, not seen previously. Methemoglobin also noted in the parietal lobe bilaterally due to prior hemorrhage. Methemoglobin also noted in the left cerebellum at site of prior infarct. Ventricle size normal.  No mass lesion or midline shift Vascular: Normal arterial flow voids. Skull  and upper cervical spine: Negative. Cervical fusion hardware noted. Sinuses/Orbits: Negative Other: None IMPRESSION: Numerous small areas of acute or subacute infarct in both cerebral hemispheres and both cerebellar hemispheres. Findings are suggestive of emboli. Extensive chronic ischemic change in the white matter bilaterally. Chronic infarct left cerebellum. Scattered  small areas of hemorrhage in the brain which appear to be subacute to chronic with evidence of methemoglobin several areas of hemorrhage. Electronically Signed   By: Franchot Gallo M.D.   On: 02/07/2019 12:08   US Carotid Bilateral  Result Date: 02/07/2019 CLINICAL DATA:  68 year old female with a history of TIA EXAM: BILATERAL CAROTID DUPLEX ULTRASOUND TECHNIQUE: Pearline Cables scale imaging, color Doppler and duplex ultrasound were performed of bilateral carotid and vertebral arteries in the neck. COMPARISON:  None. FINDINGS: Criteria: Quantification of carotid stenosis is based on velocity parameters that correlate the residual internal carotid diameter with NASCET-based stenosis levels, using the diameter of the distal internal carotid lumen as the denominator for stenosis measurement. The following velocity measurements were obtained: RIGHT ICA:  Systolic 94 cm/sec, Diastolic 28 cm/sec CCA:  43 cm/sec SYSTOLIC ICA/CCA RATIO:  2.2 ECA:  43 cm/sec LEFT ICA:  Systolic 88 cm/sec, Diastolic 29 cm/sec CCA:  42 cm/sec SYSTOLIC ICA/CCA RATIO:  2.1 ECA:  34 cm/sec Right Brachial SBP: Not acquired Left Brachial SBP: Not acquired RIGHT CAROTID ARTERY: No significant calcified disease of the right common carotid artery. Intermediate waveform maintained. Heterogeneous plaque without significant calcifications at the right carotid bifurcation. Low resistance waveform of the right ICA. No significant tortuosity. RIGHT VERTEBRAL ARTERY: Antegrade flow with low resistance waveform. LEFT CAROTID ARTERY: No significant calcified disease of the left common carotid  artery. Intermediate waveform maintained. Heterogeneous plaque at the left carotid bifurcation without significant calcifications. Low resistance waveform of the left ICA. LEFT VERTEBRAL ARTERY:  Antegrade flow with low resistance waveform. Additional: Incidental bilateral thyroid nodules. IMPRESSION: Mild heterogeneous plaque at the bilateral carotid bifurcation, with discordant results regarding degree of stenosis by established duplex criteria. Peak velocity suggests no significant stenosis, with the ICA/ CCA ratio suggesting a greater degree of stenosis, bilaterally. If establishing a more accurate degree of stenosis is required, cerebral angiogram should be considered, or as a second best test, CTA. Bilateral thyroid nodules. Referral for a dedicated thyroid ultrasound may be considered. Signed, Dulcy Fanny. Dellia Nims, RPVI [facility] Electronically Signed   By: Corrie Mckusick D.O.   On: 02/07/2019 12:18   DG Chest Portable 1 View  Result Date: 02/07/2019 CLINICAL DATA:  Hypoxia. EXAM: PORTABLE CHEST 1 VIEW COMPARISON:  Chest radiograph and CTA 5 days ago 02/02/2019 FINDINGS: Lower lung volumes from prior exam. Emphysema. Small pulmonary nodules on prior CT are not well visualized radiographically. No pulmonary edema, focal airspace disease, pleural effusion or pneumothorax. No acute osseous abnormalities are seen. IMPRESSION: 1. Emphysema without acute abnormality. 2. Small pulmonary nodules on prior CT are not well visualized radiographically. Electronically Signed   By: Keith Rake M.D.   On: 02/07/2019 03:44    Assessment: 68 y.o. female with a history of multiple medical problems including recent PE on Eliquis with subsequent acute on chronic hypoxic respiratory failure for which she was admitted here on 12/14 and discharged on 12/17.  The patient underwent pulmonary thrombectomy on 12/2 by Dr. Lucky Cowboy.  Sister reports she has been altered  for the past week but it seemed to be worse on yesterday.  Brought in for evaluation.  MRI of the brain reviewed and reveals multiple acute infarcts bilaterally and in multiple vascular distributions.  Embolic etiology.  Per sister patient has been compliant with Eliquis.  Carotid doppler not conclusive. Echocardiogram shows EF of  55-60% with no cardiac source of emboli noted. A1c 8.9.    Stroke Risk Factors -  diabetes mellitus and hypertension  Plan: 1. Fasting lipid panel 2. PT consult, OT consult, Speech consult 3. Prophylactic therapy-Continue Eliquis. Would add ASA 81mg  daily 4. Telemetry monitoring 5. Frequent neuro checks 6. EEG pending   Alexis Goodell, MD Neurology [phone] 02/07/2019, 3:03 PM

## 2019-02-07 NOTE — ED Notes (Signed)
Pt checked for incontinence, dry at this time.

## 2019-02-07 NOTE — ED Notes (Signed)
Received verbal orders for 1mg  Narcan. Pt given narcan IV with no evident response.

## 2019-02-07 NOTE — ED Notes (Signed)
Pt transported for EEG

## 2019-02-07 NOTE — ED Notes (Signed)
Pt given lunch tray but does not want to eat at this time.

## 2019-02-07 NOTE — Progress Notes (Signed)
eeg competed

## 2019-02-07 NOTE — ED Provider Notes (Signed)
[facility] Emergency Department Provider Note __________________   First MD Initiated Contact with Patient 02/07/19 0315     (approximate)  I have reviewed the triage vital signs and the nursing notes.  Level 5 caveat history and review of systems limited secondary to altered mental status HISTORY  Chief Complaint Hyperglycemia and Altered Mental Status    HPI Audrey Chan is a 68 y.o. female with below list of previous medical conditions presents to the emergency department via EMS secondary to altered mental status and hyperglycemia.  Patient sister presented to the emergency department and informed us that patient became acutely confused with grunting with nonsensical speech.  She states that she noted that her sister glucose was elevated.  Patient sister was administering insulin as prescribed.  She denies that her sister had any trauma.  Patient presents to the emergency department somnolent with nonsensical speech unable to answer questions or follow commands.     Past Medical History:  Diagnosis Date  . Allergic rhinitis   . Aneurysm of unspecified site ([place])   . Cholelithiasis   . Diabetes type 2, controlled ([place])   . GERD (gastroesophageal reflux disease)   . Hypertension   . Leg DVT (deep venous thromboembolism), acute, bilateral ([place]) 01/21/2019  . Multiple subsegmental pulmonary emboli without acute cor pulmonale ([place]) 01/20/2019  . Neuropathy   . Veno-occlusive disease     Patient Active Problem List   Diagnosis Date Noted  . Carcinoma of unknown primary ([place]) 02/04/2019  . Palliative care encounter   . Goals of care, counseling/discussion   . Acute on chronic respiratory failure with hypoxia ([place]) 02/02/2019  . History of pulmonary embolism 02/02/2019  . History of CVA (cerebrovascular accident) without residual deficits 02/02/2019  . Pulmonary embolism ([place]) 02/02/2019  . Cor pulmonale, acute ([place]) 02/02/2019  . COPD with  acute bronchitis ([place]) 02/02/2019  . Elevated troponin 02/02/2019  . Malnutrition of moderate degree 01/23/2019  . Liver lesion 01/21/2019  . Leg DVT (deep venous thromboembolism), acute, bilateral ([place]) 01/21/2019  . Multiple subsegmental pulmonary emboli without acute cor pulmonale ([place]) 01/20/2019  . CKD (chronic kidney disease), stage III 01/20/2019  . Prolonged QT interval 01/20/2019  . Acute respiratory failure with hypoxemia ([place])   . Liver metastases ([place])   . Elevated LFTs   . Uncontrolled hypertension 01/03/2019  . Lobar pneumonia ([place]) 01/03/2019  . Cerebellar stroke, acute ([place]) 01/03/2019  . DKA (diabetic ketoacidoses) ([place]) 12/30/2018  . Acute metabolic encephalopathy [id number]  . Sepsis with acute renal failure without septic shock ([place])   . AKI (acute kidney injury) ([place])   . Hyponatremia   . Hyperkalemia   . DDD (degenerative disc disease), lumbar 08/08/2018  . Acute bilateral low back pain with bilateral sciatica 08/08/2018  . Lumbar spondylosis with myelopathy 08/08/2018  . Chronic pain syndrome 08/08/2018  . Lung nodules 01/28/2015  . Supraclavicular adenopathy 01/21/2015  . Abnormal MRI, lumbar spine 01/01/2015  . Weight loss, unintentional 01/01/2015  . Lymphocytosis 01/01/2015  . Microcytic red blood cells 01/01/2015  . Recurrent biliary colic [id number]  . Calculus of gallbladder with acute on chronic cholecystitis without obstruction 12/22/2014  . Encounter for fitting or adjustment of insulin pump 08/09/2014  . Diabetic peripheral neuropathy associated with type 2 diabetes mellitus ([place]) 08/09/2014  . Hypertension 05/24/2012  . Insulin dependent type 2 diabetes mellitus ([place]) 05/24/2012  . Idiopathic localized osteoarthropathy 04/09/2012  . Dermatitis, eczematoid 02/27/2012  . Can't get food down 09/26/2011  Past Surgical History:  Procedure Laterality Date  . ABDOMINAL HYSTERECTOMY  1972  . CERVICAL FUSION    . CHOLECYSTECTOMY N/A 12/28/2014     Procedure: LAPAROSCOPIC CHOLECYSTECTOMY;  Surgeon: Sherri Rad, MD;  Location: [facility] ORS;  Service: General;  Laterality: N/A;  . EYE SURGERY     detached retina  . MASS EXCISION Left 05/06/2018   Procedure: EXCISION OF LEFT ANTECUBITAL CYST, DIABETIC;  Surgeon: Vickie Epley, MD;  Location: [facility] ORS;  Service: Vascular;  Laterality: Left;  . PULMONARY THROMBECTOMY Bilateral 01/22/2019   Procedure: PULMONARY THROMBECTOMY;  Surgeon: Algernon Huxley, MD;  Location: [place] [facility];  Service: Cardiovascular;  Laterality: Bilateral;    Prior to Admission medications   Medication Sig Start Date End Date Taking? Authorizing Provider  acetaminophen (TYLENOL) 500 MG tablet Take 500-1,000 mg by mouth every 6 (six) hours as needed for moderate pain.    Yes [provider]  albuterol (PROVENTIL HFA;VENTOLIN HFA) 108 (90 BASE) MCG/ACT inhaler Inhale 2 puffs into the lungs every 6 (six) hours as needed for wheezing or shortness of breath.    Yes [provider]  apixaban (ELIQUIS) 5 MG TABS tablet Take 1 tablet (5 mg total) by mouth 2 (two) times daily. 01/30/19  Yes [name], MD  cetirizine (ZYRTEC) 10 MG tablet Take 10 mg by mouth daily.    Yes [provider]  Cholecalciferol (VITAMIN D3) 1000 units CAPS Take 1,000 Units by mouth daily.    Yes [provider]  ferrous sulfate 325 (65 FE) MG tablet Take 325 mg by mouth 2 (two) times daily with a meal.    Yes [provider]  fluticasone (FLONASE) 50 MCG/ACT nasal spray Place 2 sprays into both nostrils daily as needed for allergies or rhinitis.    Yes [provider]  gabapentin (NEURONTIN) 800 MG tablet Take 0.5 tablets (400 mg total) by mouth 3 (three) times daily. 02/06/19  Yes Thornell Mule, MD  insulin aspart (NOVOLOG) 100 UNIT/ML injection Check blood sugar prior to meals and give insulin on the following scale: Blood sugar less than 150: 0 units BS 150-200: 2 units BS 200-250:  3 units BS 250-300: 4 units BS 300-350: 5 units BS 350-400: 6 units BS above 400: 8 units and call your doctor. 01/07/19  Yes Carrie Mew, MD  ipratropium-albuterol (DUONEB) 0.5-2.5 (3) MG/3ML SOLN Take 3 mLs by nebulization every 6 (six) hours as needed. Patient taking differently: Take 3 mLs by nebulization every 6 (six) hours as needed (wheezing/ shortness of breath).  01/25/19  Yes [name], MD  LANTUS SOLOSTAR 100 UNIT/ML Solostar Pen Inject 25 Units into the skin at bedtime. 01/07/19  Yes Carrie Mew, MD  lisinopril (ZESTRIL) 10 MG tablet Take 1 tablet (10 mg total) by mouth daily. 01/25/19  Yes [name], MD  metoprolol tartrate (LOPRESSOR) 25 MG tablet Take 12.5 mg by mouth 2 (two) times daily.   Yes [provider]  mometasone (ELOCON) 0.1 % cream Apply 1 application topically 2 (two) times daily as needed (rash).  10/08/17  Yes [provider]  Multiple Vitamin (MULTI-VITAMINS) TABS Take 1 tablet by mouth daily.   Yes [provider]  ondansetron (ZOFRAN) 8 MG tablet One pill every 8 hours as needed for nausea/vomitting. 01/30/19  Yes [name], MD  rosuvastatin (CRESTOR) 20 MG tablet Take 20 mg by mouth daily.  05/02/13  Yes [provider]  polyethylene glycol (MIRALAX / GLYCOLAX) 17 g packet  Take 17 g by mouth daily as needed for moderate constipation.  10/08/15   [provider]  umeclidinium-vilanterol (ANORO ELLIPTA) 62.5-25 MCG/INH AEPB Inhale 1 puff into the lungs daily as needed (shortness of breath).     [provider]    Allergies Patient has no known allergies.  Family History  Problem Relation Age of Onset  . Lung cancer Father     Social History Social History   Tobacco Use  . Smoking status: Former Smoker    Quit date: 02/21/1999    Years since quitting: 19.9  . Smokeless tobacco: Never Used  Substance Use Topics  . Alcohol use: No    Alcohol/week: 0.0 standard drinks  . Drug  use: No    Review of Systems Constitutional: No fever/chills Eyes: No visual changes. ENT: No sore throat. Cardiovascular: Denies chest pain. Respiratory: Denies shortness of breath. Gastrointestinal: No abdominal pain.  No nausea, no vomiting.  No diarrhea.  No constipation. Genitourinary: Negative for dysuria. Musculoskeletal: Negative for neck pain.  Negative for back pain. Integumentary: Negative for rash. Neurological: Negative for headaches, focal weakness or numbness.  Positive for altered mental status    ____________________________________________   PHYSICAL EXAM:  VITAL SIGNS: ED Triage Vitals  Enc Vitals Group     BP 02/07/19 0310 (!) 157/89     Pulse Rate 02/07/19 0310 77     Resp 02/07/19 0310 12     Temp --      Temp src --      SpO2 02/07/19 0308 99 %     Weight 02/07/19 0313 60.3 kg (132 lb 15 oz)     Height 02/07/19 0313 1.829 m (6')     Head Circumference --      Peak Flow --      Pain Score --      Pain Loc --      Pain Edu? --      Excl. in [place]? --     Constitutional: Somnolent with nonsensical speech. Eyes: Conjunctivae are normal.  Mouth/Throat: Patient is wearing a mask. Neck: No stridor.  No meningeal signs.   Cardiovascular: Normal rate, regular rhythm. Good peripheral circulation. Grossly normal heart sounds. Respiratory: Normal respiratory effort.  No retractions. Gastrointestinal: Soft and nontender. No distention.  Musculoskeletal: No lower extremity tenderness nor edema. No gross deformities of extremities. Neurologic: Nonsensical speech. No gross focal neurologic deficits are appreciated.  Skin:  Skin is warm, dry and intact. Psychiatric: Mood and affect are normal. Speech and behavior are normal.  ____________________________________________   LABS (all labs ordered are listed, but only abnormal results are displayed)  Labs Reviewed  GLUCOSE, CAPILLARY - Abnormal; Notable for the following components:      Result Value    Glucose-Capillary 398 (*)    All other components within normal limits  BASIC METABOLIC PANEL - Abnormal; Notable for the following components:   Sodium 134 (*)    Potassium 6.5 (*)    CO2 17 (*)    Glucose, Bld 368 (*)    BUN 40 (*)    Creatinine, Ser 1.82 (*)    Calcium 8.1 (*)    GFR calc non Af Amer 28 (*)    GFR calc Af Amer 32 (*)    All other components within normal limits  CBC WITH DIFFERENTIAL/PLATELET - Abnormal; Notable for the following components:   WBC 13.5 (*)    Hemoglobin 10.3 (*)    HCT 33.1 (*)    MCV  73.7 (*)    MCH 22.9 (*)    RDW 16.5 (*)    nRBC 0.4 (*)    Neutro Abs 12.1 (*)    Lymphs Abs 0.6 (*)    All other components within normal limits  AMMONIA - Abnormal; Notable for the following components:   Ammonia 36 (*)    All other components within normal limits  BETA-HYDROXYBUTYRIC ACID  BASIC METABOLIC PANEL  BASIC METABOLIC PANEL  BASIC METABOLIC PANEL  BASIC METABOLIC PANEL  BETA-HYDROXYBUTYRIC ACID  BETA-HYDROXYBUTYRIC ACID  URINALYSIS, ROUTINE W REFLEX MICROSCOPIC  BLOOD GAS, VENOUS  URINE DRUG SCREEN, QUALITATIVE ([facility] ONLY)  URINALYSIS, COMPLETE (U[facility]MP) WITH MICROSCOPIC  POTASSIUM  CBG MONITORING, ED   ____________________________________________    ____________________________________________  RADIOLOGY I, Gregor Hams, personally viewed and evaluated these images (plain radiographs) as part of my medical decision making, as well as reviewing the written report by the radiologist.  ED MD interpretation: No acute intracranial abnormality noted on CT head.  Chest x-ray revealed emphysema without any acute abnormality per radiologist.  Official radiology report(s): CT Head Wo Contrast  Result Date: 02/07/2019 CLINICAL DATA:  Altered mental status. Recent diagnosis of liver metastasis. EXAM: CT HEAD WITHOUT CONTRAST TECHNIQUE: Contiguous axial images were obtained from the base of the skull through the vertex without intravenous  contrast. COMPARISON:  Head CT 5 days ago 02/02/2019. Brain MRI 01/01/2019 FINDINGS: Brain: No intracranial hemorrhage, mass effect, or midline shift. Stable brain volume. No hydrocephalus. The basilar cisterns are patent. Unchanged chronic small vessel ischemia. Remote left cerebellar infarct. Small additional cerebellar infarct on MRI last month is not well seen by CT. No evidence of territorial infarct or acute ischemia. No extra-axial or intracranial fluid collection. Vascular: No hyperdense vessel. Skull: No fracture or focal lesion. Sinuses/Orbits: Paranasal sinuses and mastoid air cells are clear. The visualized orbits are unremarkable. Other: None. IMPRESSION: 1. No acute intracranial abnormality. 2. Unchanged atrophy and chronic ischemia. Electronically Signed   By: Keith Rake M.D.   On: 02/07/2019 03:54   DG Chest Portable 1 View  Result Date: 02/07/2019 CLINICAL DATA:  Hypoxia. EXAM: PORTABLE CHEST 1 VIEW COMPARISON:  Chest radiograph and CTA 5 days ago 02/02/2019 FINDINGS: Lower lung volumes from prior exam. Emphysema. Small pulmonary nodules on prior CT are not well visualized radiographically. No pulmonary edema, focal airspace disease, pleural effusion or pneumothorax. No acute osseous abnormalities are seen. IMPRESSION: 1. Emphysema without acute abnormality. 2. Small pulmonary nodules on prior CT are not well visualized radiographically. Electronically Signed   By: Keith Rake M.D.   On: 02/07/2019 03:44      Procedures   ____________________________________________   INITIAL IMPRESSION / MDM / [place] / ED COURSE  As part of my medical decision making, I reviewed the following data within the electronic MEDICAL RECORD NUMBER   68 year old female presented with above-stated history and physical exam a differential diagnosis including but not limited to DKA however I expected this to be unlikely given glucose on arrival of 398 with an anion gap noted on  laboratory data of 12.  CVA however given recent presentation much the same with a CT scan that was normal I suspect this also be unlikely.  CT head today revealed no acute intracranial abnormality.  Consider the possibility of hepatic encephalopathy given reported liver cancer and a such ammonia obtained however ammonia level only 36 and again unlikely to be the source of the patient's altered mental status.  After being in the  emergency department for 1-1/2 hours patient is now alert oriented with no recollection of event this presents the possibility that the patient was post ictal.  As such will discuss patient with hospital staff for admission.      ____________________________________________  FINAL CLINICAL IMPRESSION(S) / ED DIAGNOSES  Final diagnoses:  Altered mental status, unspecified altered mental status type     MEDICATIONS GIVEN DURING THIS VISIT:  Medications  sodium chloride 0.9 % bolus 1,000 mL (has no administration in time range)  sodium chloride 0.9 % bolus 1,000 mL (0 mLs Intravenous Stopped 02/07/19 0508)  naloxone (NARCAN) injection 1 mg (1 mg Intravenous Given 02/07/19 [id number])     ED Discharge Orders    None      *Please note:  Audrey Chan was evaluated in Emergency Department on 02/07/2019 for the symptoms described in the history of present illness. She was evaluated in the context of the global COVID-19 pandemic, which necessitated consideration that the patient might be at risk for infection with the SARS-CoV-2 virus that causes COVID-19. Institutional protocols and algorithms that pertain to the evaluation of patients at risk for COVID-19 are in a state of rapid change based on information released by regulatory bodies including the CDC and federal and state organizations. These policies and algorithms were followed during the patient's care in the ED.  Some ED evaluations and interventions may be delayed as a result of limited staffing during the  pandemic.*  Note:  This document was prepared using Dragon voice recognition software and may include unintentional dictation errors.   Gregor Hams, MD 02/07/19 [phone]

## 2019-02-08 LAB — BASIC METABOLIC PANEL
Anion gap: 10 (ref 5–15)
BUN: 31 mg/dL — ABNORMAL HIGH (ref 8–23)
CO2: 19 mmol/L — ABNORMAL LOW (ref 22–32)
Calcium: 8.1 mg/dL — ABNORMAL LOW (ref 8.9–10.3)
Chloride: 112 mmol/L — ABNORMAL HIGH (ref 98–111)
Creatinine, Ser: 1.25 mg/dL — ABNORMAL HIGH (ref 0.44–1.00)
GFR calc Af Amer: 51 mL/min — ABNORMAL LOW (ref >60)
GFR calc non Af Amer: 44 mL/min — ABNORMAL LOW (ref >60)
Glucose, Bld: 72 mg/dL (ref 70–99)
Potassium: 5 mmol/L (ref 3.5–5.1)
Sodium: 141 mmol/L (ref 135–145)

## 2019-02-08 LAB — GLUCOSE, CAPILLARY
Glucose-Capillary: 44 mg/dL — CL (ref 70–99)
Glucose-Capillary: 47 mg/dL — ABNORMAL LOW (ref 70–99)
Glucose-Capillary: 60 mg/dL — ABNORMAL LOW (ref 70–99)
Glucose-Capillary: 82 mg/dL (ref 70–99)
Glucose-Capillary: 182 mg/dL — ABNORMAL HIGH (ref 70–99)
Glucose-Capillary: 219 mg/dL — ABNORMAL HIGH (ref 70–99)
Glucose-Capillary: 174 mg/dL — ABNORMAL HIGH (ref 70–99)

## 2019-02-08 LAB — CBC
HCT: 33.7 % — ABNORMAL LOW (ref 36.0–46.0)
Hemoglobin: 10.4 g/dL — ABNORMAL LOW (ref 12.0–15.0)
MCH: 22.6 pg — ABNORMAL LOW (ref 26.0–34.0)
MCHC: 30.9 g/dL (ref 30.0–36.0)
MCV: 73.3 fL — ABNORMAL LOW (ref 80.0–100.0)
Platelets: 331 10*3/uL (ref 150–400)
RBC: 4.6 MIL/uL (ref 3.87–5.11)
RDW: 16.7 % — ABNORMAL HIGH (ref 11.5–15.5)
WBC: 10.3 10*3/uL (ref 4.0–10.5)
nRBC: 0.7 % — ABNORMAL HIGH (ref 0.0–0.2)

## 2019-02-08 NOTE — Progress Notes (Signed)
PROGRESS NOTE    Audrey Chan  [id number] DOB: [birth year] DOA: 02/07/2019  PCP: Theotis Burrow, MD    LOS - 1   Brief Narrative:  68 y.o. female with a history of recent PE with subsequent acute on chronic hypoxic respiratory failure for which she was admitted here on 12/14 and discharged on 12/17, underwent pulmonary thrombectomy on 12/2 by Dr. Lucky Cowboy.  Presented to the ED after her sister found her to be altered, acutely confused and nonsensical speech.  She checked her blood glucose and it was elevated, but patient later reported that she had been having hypoglycemia (40s to 50s) recently.  No witnessed seizure-like activity.  No loss of consciousness.  In the ED, mildly hypertensive, afebrile, initially required non-rebreather at 100% oxygen, weaned to nasal cannula.  Labs showed mild hyponatremia, bicarb 17, glucose 368, BUN 40, creatinine 1.82.  Ammonia level 36.  Normal anion gap.  Initially potassium 6.5 but was hemolyzed sample, repeat normal.  CBC showed leukocytosis of 13.5 with neutrophilia, and anemia.  UA showed more than 500 glucose and UDS negative.  Head CT scan revealed unchanged atrophy and chronic ischemia with no acute intracranial abnormalities.  Patient was given Narcan in the ED without initial improvement, but eventually did appear back to baseline.  Admitted for further evaluation and workup of altered mental status.  Neurology consulted.  Evaluated for possible seizure vs CVA/TIA.  EEG showed  no epileptiform activity.  Echocardiogram shows EF of 55-60% with no cardiac source of emboli noted.  Carotid dopplers inconclusive for significant stenosis.  MRI brain showed scattered infarcts in multiple distributions, consistent with embolic strokes.  Patient has been on Eliquis for PE and she and her sister report compliance taking it.  Patient has been intermittently confused and with mildly slurred speech at times.  PT evaluation pending.  Subjective  12/19: Patient awake sitting up in bed when seen today.  RN reports she has been confused today, also sleeping a lot.  No acute events reported.  Patient reports feeling okay.  Denies fever,chills, headache, focal weakness, difficulty swallowing.  Denies any new or worsening symptoms.    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  No embolic source identified as of yet, see above narrative for workup to date.   --Neurology following --fasting lipid panel  --PT, OT, SLP evaluations --continue Eliquis --continue ASA --neuro checks --telemetry --glucose control, goal A1C < 7.0%  Acute encephalopathy secondary to above - improving  Pulmonary embolism, recent, s/p thrombectomy, on Eliquis --stable respiratory status at this time --continue Eliquis  Dyslipidemia Continue statin   Type 2 diabetes mellitus --sliding scale coverage  --hold off her Lantus for now given labile sugars reported  Peripheral neuropathy --continue gabapentin   DVT prophylaxis: on Eliquis   Code Status: Full Code  Family Communication: none at bedside  Disposition Plan:  Expect d/c home pending further clinical improvement, PT eval, neurology clearance   Consultants:   Neurology  Procedures:   Echo  Antimicrobials:   None    Objective: Vitals:   02/07/19 2131 02/08/19 0528 02/08/19 1121 02/08/19 1410  BP: 128/82 130/72 (!) 140/94 124/76  Pulse: 83 79 91 82  Resp: 18 16 17 18   Temp: 97.8 F (36.6 C) 97.9 F (36.6 C) 97.9 F (36.6 C) 98.3 F (36.8 C)  TempSrc: Oral Oral  Oral Oral  SpO2: 100% 96% 96% 92%  Weight:      Height:        Intake/Output Summary (Last 24 hours) at 02/08/2019 1737 Last data filed at 02/08/2019 1714 Gross per 24 hour  Intake 1323.84 ml  Output 1000 ml  Net 323.84 ml   Filed Weights    02/07/19 0313  Weight: 60.3 kg    Examination:  General exam: awake, alert, no acute distress  Respiratory system: clear to auscultation bilaterally, no wheezes, rales or rhonchi, normal respiratory effort. Cardiovascular system: normal S1/S2, RRR, no JVD, murmurs, rubs, gallops, no pedal edema.   Central nervous system: alert and oriented to self only , mildly slurred speech, otherwise cranial nerves grossly intact Extremities: moves all, no edema, normal tone Skin: dry, intact, normal temperature Psychiatry: normal mood, congruent affect   Data Reviewed: I have personally reviewed following labs and imaging studies  CBC: Recent Labs  Lab 02/02/19 1929 02/03/19 0454 02/04/19 0446 02/07/19 0414 02/08/19 0454  WBC 10.8* 9.8 7.5 13.5* 10.3  NEUTROABS 7.8*  --   --  12.1*  --   HGB 9.6* 9.7* 10.1* 10.3* 10.4*  HCT 28.8* 28.8* 30.0* 33.1* 33.7*  MCV 67.4* 67.4* 67.9* 73.7* 73.3*  PLT 374 376 407* 391 [id number]   Basic Metabolic Panel: Recent Labs  Lab 02/05/19 0612 02/05/19 2123 02/06/19 0625 02/07/19 0414 02/07/19 0536 02/08/19 0454  NA 140 133* 136 134*  --  141  K 4.2 5.4* 4.5 6.5* 4.6 5.0  CL 108 103 106 105  --  112*  CO2 24 18* 18* 17*  --  19*  GLUCOSE 67* 556* 194* 368*  --  72  BUN 26* 29* 30* 40*  --  31*  CREATININE 1.43* 1.71* 1.55* 1.82*  --  1.25*  CALCIUM 9.2 8.7* 8.8* 8.1*  --  8.1*  MG  --   --   --   --  1.9  --   PHOS  --   --   --   --  4.3  --    GFR: Estimated Creatinine Clearance: 40.4 mL/min (A) (by C-G formula based on SCr of 1.25 mg/dL (H)). Liver Function Tests: Recent Labs  Lab 02/02/19 1929  AST 130*  ALT 92*  ALKPHOS 1,819*  BILITOT 1.0  PROT 7.4  ALBUMIN 3.2*   No results for input(s): LIPASE, AMYLASE in the last 168 hours. Recent Labs  Lab 02/03/19 1057 02/07/19 0414  AMMONIA 19 36*   Coagulation Profile: Recent Labs  Lab 02/02/19 1929  INR 1.6*   Cardiac Enzymes: No results for input(s): CKTOTAL, CKMB, CKMBINDEX,  TROPONINI in the last 168 hours. BNP (last 3 results) No results for input(s): PROBNP in the last 8760 hours. HbA1C: No results for input(s): HGBA1C in the last 72 hours. CBG: Recent Labs  Lab 02/08/19 0804 02/08/19 0824 02/08/19 0841 02/08/19 1140 02/08/19 1631  GLUCAP 44* 60* 82 182* 219*   Lipid Profile: No results for input(s): CHOL, HDL, LDLCALC, TRIG, CHOLHDL, LDLDIRECT in the last 72 hours. Thyroid Function Tests: No results for input(s): TSH, T4TOTAL, FREET4, T3FREE, THYROIDAB in the last 72 hours. Anemia Panel: No results for input(s): VITAMINB12, FOLATE, FERRITIN, TIBC, IRON, RETICCTPCT in the last 72 hours. Sepsis Labs: Recent Labs  Lab 02/03/19 1057  LATICACIDVEN 2.4*    Recent Results (from the past 240 hour(s))  SARS CORONAVIRUS 2 (TAT 6-24 HRS) Nasopharyngeal Nasopharyngeal Swab     Status: None   Collection Time: 02/02/19  9:30 PM   Specimen: Nasopharyngeal Swab  Result Value Ref Range Status   SARS Coronavirus 2 NEGATIVE NEGATIVE Final    Comment: (NOTE) SARS-CoV-2 target nucleic acids are NOT DETECTED. The SARS-CoV-2 RNA is generally detectable in upper and lower respiratory specimens during the acute phase of infection. Negative results do not preclude SARS-CoV-2 infection, do not rule out co-infections with other pathogens, and should not be used as the sole basis for treatment or other patient management decisions. Negative results must be combined with clinical observations, patient history, and epidemiological information. The expected result is Negative. Fact Sheet for Patients: SugarRoll.be Fact Sheet for Healthcare Providers: https://www.woods-mathews.com/ This test is not yet approved or cleared by the [place] FDA and  has been authorized for detection and/or diagnosis of SARS-CoV-2 by FDA under an Emergency Use Authorization (EUA). This EUA will remain  in effect (meaning this test can be  used) for the duration of the COVID-19 declaration under Section 56 4(b)(1) of the Act, 21 U.S.C. section 360bbb-3(b)(1), unless the authorization is terminated or revoked sooner. Performed at [facility], [place] [address] [place], [place] [address]          Radiology Studies: EEG  Result Date: 02/07/2019 Alexis Goodell, MD     02/07/2019  3:58 PM ELECTROENCEPHALOGRAM REPORT Patient: AQUANETTA DARCEY       Room #: [id number] EEG No. ID: [id number] Age: 68 y.o.        Sex: female Referring Physician: Mansy Report Date:  02/07/2019       Interpreting Physician: Alexis Goodell History: SHANESE BENKOWSKI is an 68 y.o. female with altered mental status Medications: Eliquis, ASA, Neurontin, Insulin, Claritin. Lopressor, Crestor Conditions of Recording:  This is a 21 channel routine scalp EEG performed with bipolar and monopolar montages arranged in accordance to the international 10/20 system of electrode placement. One channel was dedicated to EKG recording. The patient is in the awake and drowsy states. Description:  The background activity is slow and poorly organized.  It is dominated by a low voltage theta activity that is diffusely distributed. Some intermittent underlying delta activity is noted at times as well.  No epileptiform activity is noted.  Hyperventilation was not performed.  Intermittent photic stimulation was performed but failed to illicit any change in the tracing. IMPRESSION: This is an abnormal EEG secondary to general background slowing.  This finding may be seen with a diffuse disturbance that is etiologically nonspecific, but may include a metabolic encephalopathy, among other possibilities.  No epileptiform activity was noted.  Alexis Goodell, MD Neurology [phone] 02/07/2019, 3:52 PM   CT Head Wo Contrast  Result Date: 02/07/2019 CLINICAL DATA:  Altered mental status. Recent diagnosis of liver metastasis. EXAM: CT HEAD WITHOUT CONTRAST TECHNIQUE: Contiguous axial  images were obtained from the base of the skull through the vertex without intravenous contrast. COMPARISON:  Head CT 5 days ago 02/02/2019. Brain MRI 01/01/2019 FINDINGS: Brain: No intracranial hemorrhage, mass effect, or midline shift. Stable brain volume. No hydrocephalus. The basilar cisterns are patent. Unchanged chronic small vessel ischemia. Remote left cerebellar infarct. Small additional cerebellar infarct on MRI last month is not well seen by CT. No evidence of territorial infarct or acute ischemia. No extra-axial or intracranial fluid collection. Vascular: No hyperdense vessel. Skull: No fracture or focal lesion. Sinuses/Orbits: Paranasal sinuses and mastoid air cells are clear. The visualized orbits are unremarkable. Other: None. IMPRESSION: 1. No acute intracranial abnormality. 2. Unchanged atrophy and chronic ischemia. Electronically Signed  By: Keith Rake M.D.   On: 02/07/2019 03:54   MR BRAIN WO CONTRAST  Result Date: 02/07/2019 CLINICAL DATA:  TIA.  Renal failure and stage IV liver cancer. EXAM: MRI HEAD WITHOUT CONTRAST TECHNIQUE: Multiplanar, multiecho pulse sequences of the brain and surrounding structures were obtained without intravenous contrast. COMPARISON:  MRI head 01/01/2019.  CT head 02/07/2019 FINDINGS: Brain: Multiple small areas of acute infarct throughout both cerebellar hemispheres. Small areas of acute/subacute infarct in both cerebral hemispheres involving the frontal and parietal lobes. Extensive chronic microvascular ischemic change throughout the cerebral white matter bilaterally. Chronic infarcts in the cerebellum on the left. Small areas of hemorrhage in the right temporal and parietal lobe, not seen previously. Methemoglobin also noted in the parietal lobe bilaterally due to prior hemorrhage. Methemoglobin also noted in the left cerebellum at site of prior infarct. Ventricle size normal.  No mass lesion or midline shift Vascular: Normal arterial flow voids. Skull  and upper cervical spine: Negative. Cervical fusion hardware noted. Sinuses/Orbits: Negative Other: None IMPRESSION: Numerous small areas of acute or subacute infarct in both cerebral hemispheres and both cerebellar hemispheres. Findings are suggestive of emboli. Extensive chronic ischemic change in the white matter bilaterally. Chronic infarct left cerebellum. Scattered small areas of hemorrhage in the brain which appear to be subacute to chronic with evidence of methemoglobin several areas of hemorrhage. Electronically Signed   By: Franchot Gallo M.D.   On: 02/07/2019 12:08   US Carotid Bilateral  Result Date: 02/07/2019 CLINICAL DATA:  68 year old female with a history of TIA EXAM: BILATERAL CAROTID DUPLEX ULTRASOUND TECHNIQUE: Pearline Cables scale imaging, color Doppler and duplex ultrasound were performed of bilateral carotid and vertebral arteries in the neck. COMPARISON:  None. FINDINGS: Criteria: Quantification of carotid stenosis is based on velocity parameters that correlate the residual internal carotid diameter with NASCET-based stenosis levels, using the diameter of the distal internal carotid lumen as the denominator for stenosis measurement. The following velocity measurements were obtained: RIGHT ICA:  Systolic 94 cm/sec, Diastolic 28 cm/sec CCA:  43 cm/sec SYSTOLIC ICA/CCA RATIO:  2.2 ECA:  43 cm/sec LEFT ICA:  Systolic 88 cm/sec, Diastolic 29 cm/sec CCA:  42 cm/sec SYSTOLIC ICA/CCA RATIO:  2.1 ECA:  34 cm/sec Right Brachial SBP: Not acquired Left Brachial SBP: Not acquired RIGHT CAROTID ARTERY: No significant calcified disease of the right common carotid artery. Intermediate waveform maintained. Heterogeneous plaque without significant calcifications at the right carotid bifurcation. Low resistance waveform of the right ICA. No significant tortuosity. RIGHT VERTEBRAL ARTERY: Antegrade flow with low resistance waveform. LEFT CAROTID ARTERY: No significant calcified disease of the left common carotid  artery. Intermediate waveform maintained. Heterogeneous plaque at the left carotid bifurcation without significant calcifications. Low resistance waveform of the left ICA. LEFT VERTEBRAL ARTERY:  Antegrade flow with low resistance waveform. Additional: Incidental bilateral thyroid nodules. IMPRESSION: Mild heterogeneous plaque at the bilateral carotid bifurcation, with discordant results regarding degree of stenosis by established duplex criteria. Peak velocity suggests no significant stenosis, with the ICA/ CCA ratio suggesting a greater degree of stenosis, bilaterally. If establishing a more accurate degree of stenosis is required, cerebral angiogram should be considered, or as a second best test, CTA. Bilateral thyroid nodules. Referral for a dedicated thyroid ultrasound may be considered. Signed, Dulcy Fanny. Dellia Nims, RPVI [facility] Electronically Signed   By: Corrie Mckusick D.O.   On: 02/07/2019 12:18   DG Chest Portable 1 View  Result Date: 02/07/2019 CLINICAL DATA:  Hypoxia.  EXAM: PORTABLE CHEST 1 VIEW COMPARISON:  Chest radiograph and CTA 5 days ago 02/02/2019 FINDINGS: Lower lung volumes from prior exam. Emphysema. Small pulmonary nodules on prior CT are not well visualized radiographically. No pulmonary edema, focal airspace disease, pleural effusion or pneumothorax. No acute osseous abnormalities are seen. IMPRESSION: 1. Emphysema without acute abnormality. 2. Small pulmonary nodules on prior CT are not well visualized radiographically. Electronically Signed   By: Keith Rake M.D.   On: 02/07/2019 03:44        Scheduled Meds: . apixaban  5 mg Oral BID  . aspirin EC  81 mg Oral Daily  . cholecalciferol  1,000 Units Oral Daily  . ferrous sulfate  325 mg Oral BID WC  . gabapentin  400 mg Oral TID  . insulin aspart  0-9 Units Subcutaneous TID WC  . loratadine  10 mg Oral Daily  . metoprolol tartrate  12.5 mg Oral BID  .  multivitamin with minerals  1 tablet Oral Daily  . rosuvastatin  20 mg Oral Daily   Continuous Infusions:   LOS: 1 day    Time spent: 30-35 minutes    Ezekiel Slocumb, DO [facility]   If 7PM-7AM, please contact night-coverage [email or web address] Password [facility] 02/08/2019, 5:37 PM

## 2019-02-08 NOTE — Evaluation (Signed)
Clinical/Bedside Swallow Evaluation Patient Details  Name: Audrey Chan MRN: [id number] Date of Birth: 10/17/1950  Today's Date: 02/08/2019 Time: SLP Start Time (ACUTE ONLY): 25 SLP Stop Time (ACUTE ONLY): 1500 SLP Time Calculation (min) (ACUTE ONLY): 45 min  Past Medical History:  Past Medical History:  Diagnosis Date  . Allergic rhinitis   . Aneurysm of unspecified site ([place])   . Cholelithiasis   . Diabetes type 2, controlled ([place])   . GERD (gastroesophageal reflux disease)   . Hypertension   . Leg DVT (deep venous thromboembolism), acute, bilateral ([place]) 01/21/2019  . Multiple subsegmental pulmonary emboli without acute cor pulmonale ([place]) 01/20/2019  . Neuropathy   . Veno-occlusive disease    Past Surgical History:  Past Surgical History:  Procedure Laterality Date  . ABDOMINAL HYSTERECTOMY  1972  . CERVICAL FUSION    . CHOLECYSTECTOMY N/A 12/28/2014   Procedure: LAPAROSCOPIC CHOLECYSTECTOMY;  Surgeon: Sherri Rad, MD;  Location: [facility] ORS;  Service: General;  Laterality: N/A;  . EYE SURGERY     detached retina  . MASS EXCISION Left 05/06/2018   Procedure: EXCISION OF LEFT ANTECUBITAL CYST, DIABETIC;  Surgeon: Vickie Epley, MD;  Location: [facility] ORS;  Service: Vascular;  Laterality: Left;  . PULMONARY THROMBECTOMY Bilateral 01/22/2019   Procedure: PULMONARY THROMBECTOMY;  Surgeon: Algernon Huxley, MD;  Location: [facility];  Service: Cardiovascular;  Laterality: Bilateral;   HPI: Per admitting History and Physical:   Pt is 68 y.o. female with a known history of multiple medical problems that are mentioned below including recent PE with subsequent acute on chronic hypoxic respiratory failure for which she was admitted here on 12/14 and discharged on 12/17.  The patient underwent pulmonary thrombectomy on 12 2 by Dr. Lucky Cowboy.  She has been noted to have altered mental status last night by his sister with acute confusion and grunting with nonsensical speech.  She checked  her blood glucose and it was elevated.  The patient herself is telling me that she has been having hypoglycemia with blood glucose in the 40s to 50s.  She was not witnessed to have seizures.  She denies any chest pain or dyspnea or palpitations.  No cough or wheezing or hemoptysis.  She has been having polyuria and polydipsia  Assessment / Plan / Recommendation Clinical Impression  Pt admitted with altered mental status, acute encephalopathy with orders for bedside swallow eval. Pt was sleeping and very difficult to arouse when ST entered. Max cues to awaken and shift to her back as she was initially laying on her side. Max cues needed to get Pt to take bites of pudding. She opened her mouth for the food but immediately turned her head as if trying to refuse. Pt could not respond to questions or follow commands for ST. After several attempts, Pt did take 2 sips of thin liquid from a straw. No s/s of aspiration with the pudding or the sips of thin liquid. Noted Pt. seemed fatigued, often taking deep breaths and pursing her lips for exhalation, but not short of breath. Nurse tech took O2 sats which were 92 on 3 liters of oxygen. Nurse caring for Pt was made aware and will monitor. Nsg reports Pt has had low blood sugar this morning and was able to take in several crackers and thin liquid without any s/s of aspiration or oral difficulty. Pts altered mental status affecting Pts. ability to take in food for adequate nutrition. Rec. diet downgrade to Dys 2, chopped with  thin liquids. If mental status improves, diet can be upgraded. ST to follow up with Nsg on toleration of diet. May need cognitive and speech eval however, at this time Pt is not able to participate. SLP Visit Diagnosis: Dysphagia, oropharyngeal phase (R13.12)    Aspiration Risk  Mild aspiration risk    Diet Recommendation Dysphagia 2 (Fine chop)   Liquid Administration via: Straw;Cup Medication Administration: Crushed with puree Supervision:  Staff to assist with self feeding Postural Changes: Seated upright at 90 degrees;Remain upright for at least 30 minutes after po intake    Other  Recommendations     Follow up Recommendations   F/u with toleration of diet     Frequency and Duration            Prognosis   Good for toleration but likely to have poor Pos with altered mental status     Swallow Study   General Date of Onset: 02/07/19 Type of Study: Bedside Swallow Evaluation Diet Prior to this Study: Regular Temperature Spikes Noted: No Respiratory Status: Nasal cannula History of Recent Intubation: No Behavior/Cognition: Confused;Agitated;Lethargic/Drowsy;Uncooperative Oral Cavity Assessment: Within Functional Limits Oral Care Completed by SLP: No Oral Cavity - Dentition: Adequate natural dentition Self-Feeding Abilities: Needs assist;Other (Comment)(secondary to altered mental status) Patient Positioning: Upright in bed Baseline Vocal Quality: Low vocal intensity    Oral/Motor/Sensory Function     Ice Chips     Thin Liquid Thin Liquid: Within functional limits Presentation: Straw    Nectar Thick     Honey Thick     Puree Puree: Within functional limits Presentation: Spoon   Solid     Solid: Not tested      Lucila Maine 02/08/2019,3:27 PM

## 2019-02-08 NOTE — Evaluation (Signed)
Physical Therapy Evaluation Patient Details Name: BRIUNA DALKE MRN: [id number] DOB: 02/09/1951 Today's Date: 02/08/2019   History of Present Illness  Pt is a 68 y.o. female presenting to hospital 02/07/19 with AMS and hyperglycemia.  Pt with recent hospitalization 12/14-12/17 with acute on chronic hypoxic respiratory failure.  Pt also s/p mechanical thrombectomy 01/22/19.  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  Clinical Impression  Prior to hospital admission, pt appears to have been ambulatory (per chart review and recent therapy notes).  Pt resting in bed upon PT arrival.  Able to state name and month of birthday repetitively; otherwise stating "yes" repetitively to therapists questions or cueing.  Currently pt is 1 assist with bed mobility (pt did not initiate to sit up on edge of bed on her own but once therapist gave pt additional vc's, tactile cues, and started to assist pt, then pt sat up the rest of the way on her own).  Attempted to have pt stand to walk but pt kept stating "oh no" and then eventually pt layed back down in bed on her own.  Pt would benefit from skilled PT to address noted impairments and functional limitations (see below for any additional details).  Upon hospital discharge, anticipate pt would benefit from STR.    Follow Up Recommendations SNF    Equipment Recommendations  (TBD at next facility)    Recommendations for Other Services OT consult     Precautions / Restrictions Precautions Precautions: Fall Precaution Comments: Seizure precautions Restrictions Weight Bearing Restrictions: No      Mobility  Bed Mobility Overal bed mobility: Needs Assistance Bed Mobility: Supine to Sit;Sit to Supine     Supine to sit: Mod assist;HOB elevated Sit to supine: Min guard;HOB elevated    General bed mobility comments: therapist assisted pt initially with B LE's and trunk and then pt finished sitting up on her own; CGA sit to semi-supine for safety (2 assist to boost pt up in bed end of session)  Transfers                 General transfer comment: pt kept stating "oh no" when therapist attempted to have pt stand to walk and then pt layed back down in bed on her own  Ambulation/Gait             General Gait Details: unable (see transfers above)  Stairs            Wheelchair Mobility    Modified Rankin (Stroke Patients Only)       Balance Overall balance assessment: Needs assistance Sitting-balance support: No upper extremity supported;Feet supported Sitting balance-Leahy Scale: Fair Sitting balance - Comments: steady static sitting       Standing balance comment: unable to stand to attempt                             Pertinent Vitals/Pain Pain Assessment: Faces Faces Pain Scale: No hurt Pain Intervention(s): Limited activity within patient's tolerance;Repositioned;Monitored during session  Vitals (HR and O2 on 4 L O2 via nasal cannula) stable and WFL throughout treatment session.    Home Living                   Additional Comments: Per chart pt lives with her sister.  Pt unable to verbalize home living situation.    Prior Function           Comments: Per chart (recent OT evaluation) pt was independent with all mobility and ADL's/IADL's at baseline.  Pt unable to verbalize PLOF.     Hand Dominance        Extremity/Trunk Assessment   Upper Extremity Assessment Upper Extremity Assessment: Difficult to assess due to impaired cognition    Lower Extremity Assessment Lower Extremity Assessment: Difficult to assess due to impaired cognition    Cervical / Trunk Assessment Cervical / Trunk Assessment: Normal  Communication   Communication: Other (comment)(pt able to verbalize clearly but limited  communication noted)  Cognition Arousal/Alertness: Lethargic Behavior During Therapy: Flat affect Overall Cognitive Status: No family/caregiver present to determine baseline cognitive functioning                                 General Comments: Able to state her name and month of birthday only (repetitively); otherwise pt only said "yes" or "oh no" (repetitively) during rest of session's activities.      General Comments   Nursing cleared pt for participation in physical therapy.    Exercises     Assessment/Plan    PT Assessment Patient needs continued PT services  PT Problem List Decreased strength;Decreased activity tolerance;Decreased balance;Decreased mobility;Decreased cognition;Decreased knowledge of use of DME;Decreased knowledge of precautions;Decreased safety awareness       PT Treatment Interventions DME instruction;Gait training;Stair training;Functional mobility training;Therapeutic activities;Therapeutic exercise;Balance training;Neuromuscular re-education;Patient/family education    PT Goals (Current goals can be found in the Care Plan section)  Acute Rehab PT Goals Patient Stated Goal: to improve mobility PT Goal Formulation: Patient unable to participate in goal setting Time For Goal Achievement: 02/22/19 Potential to Achieve Goals: Good    Frequency 7X/week   Barriers to discharge Decreased caregiver support      Co-evaluation               AM-PAC PT "6 Clicks" Mobility  Outcome Measure Help needed turning from your back to your side while in a flat bed without using bedrails?: A Little Help needed moving from lying on your back to sitting on the side of a flat bed without using bedrails?: A Lot Help needed moving to and from a bed to a chair (including a wheelchair)?: A Lot Help needed standing up from a chair using your arms (e.g., wheelchair or bedside chair)?: A Lot Help needed to walk in hospital room?: Total Help needed  climbing 3-5 steps with a railing? : Total 6 Click Score: 11    End of Session Equipment Utilized During Treatment: Oxygen(4 L via nasal cannula) Activity Tolerance: Patient tolerated treatment well Patient left: in bed;with call bell/phone within reach;with bed alarm set;with nursing/sitter in room Nurse Communication: Mobility status;Precautions PT Visit Diagnosis: Other abnormalities of gait and mobility (R26.89);Muscle weakness (generalized) (M62.81);Difficulty in walking, not elsewhere classified (R26.2)    Time: [id number] PT Time Calculation (min) (ACUTE ONLY): 20 min   Charges:   PT Evaluation $PT Eval Low Complexity: 1 Low          Estel Tonelli, PT 02/08/19, 1:32 PM

## 2019-02-08 NOTE — Plan of Care (Signed)
Pt slept throughout the shift, but easily aroused.  Pt having hard time staying awake and required total assist with feeding.  Pt swallow without difficulty.  Up to bsc with 2x assist.

## 2019-02-08 NOTE — Progress Notes (Addendum)
Hypoglycemic Event  CBG: 47, 44 at 0804  Treatment: 8oz OJ Symptoms: Responds to voice. Easily aroused.  Alert to self. Follows commands.  Follow-up CBG: [id number] CBG Result: 60  Additional 4oz of OJ given and Graham crackers, CBG 82 at 0841.  Possible Reasons for Event:  Unknown.   Comments/MD notified: Nicole Kindred, DO made aware.  No new orders at this time.     Audrey Chan

## 2019-02-08 NOTE — Progress Notes (Addendum)
Subjective: Patient in bed resting.    Objective: Current vital signs: BP 130/72 (BP Location: Right Arm)   Pulse 79   Temp 97.9 F (36.6 C) (Oral)   Resp 16   Ht 6' (1.829 m)   Wt 60.3 kg   SpO2 96%   BMI 18.03 kg/m  Vital signs in last 24 hours: Temp:  [97.8 F (36.6 C)-98.4 F (36.9 C)] 97.9 F (36.6 C) (12/19 0528) Pulse Rate:  [75-83] 79 (12/19 0528) Resp:  [15-18] 16 (12/19 0528) BP: (125-130)/(71-82) 130/72 (12/19 0528) SpO2:  [94 %-100 %] 96 % (12/19 0528)  Intake/Output from previous day: 12/18 0701 - 12/19 0700 In: 2323.8 [I.V.:2323.8] Out: 500 [Urine:500] Intake/Output this shift: No intake/output data recorded. Nutritional status:  Diet Order            Diet heart healthy/carb modified Room service appropriate? Yes; Fluid consistency: Thin  Diet effective now              Neurologic Exam: Mental Status: Lethargic but able to be aroused.  Follows some simple commands.   Cranial Nerves: Not cooperative for cranial nerve testing Motor: Moves all extremities against gravity Sensory: Pinprick and light touch intact throughout, bilaterally   Lab Results: Basic Metabolic Panel: Recent Labs  Lab 02/05/19 0612 02/05/19 2123 02/06/19 0625 02/07/19 0414 02/07/19 0536 02/08/19 0454  NA 140 133* 136 134*  --  141  K 4.2 5.4* 4.5 6.5* 4.6 5.0  CL 108 103 106 105  --  112*  CO2 24 18* 18* 17*  --  19*  GLUCOSE 67* 556* 194* 368*  --  72  BUN 26* 29* 30* 40*  --  31*  CREATININE 1.43* 1.71* 1.55* 1.82*  --  1.25*  CALCIUM 9.2 8.7* 8.8* 8.1*  --  8.1*  MG  --   --   --   --  1.9  --   PHOS  --   --   --   --  4.3  --     Liver Function Tests: Recent Labs  Lab 02/02/19 1929  AST 130*  ALT 92*  ALKPHOS 1,819*  BILITOT 1.0  PROT 7.4  ALBUMIN 3.2*   No results for input(s): LIPASE, AMYLASE in the last 168 hours. Recent Labs  Lab 02/03/19 1057 02/07/19 0414  AMMONIA 19 36*    CBC: Recent Labs  Lab 02/02/19 1929 02/03/19 0454  02/04/19 0446 02/07/19 0414 02/08/19 0454  WBC 10.8* 9.8 7.5 13.5* 10.3  NEUTROABS 7.8*  --   --  12.1*  --   HGB 9.6* 9.7* 10.1* 10.3* 10.4*  HCT 28.8* 28.8* 30.0* 33.1* 33.7*  MCV 67.4* 67.4* 67.9* 73.7* 73.3*  PLT 374 376 407* 391 331    Cardiac Enzymes: No results for input(s): CKTOTAL, CKMB, CKMBINDEX, TROPONINI in the last 168 hours.  Lipid Panel: No results for input(s): CHOL, TRIG, HDL, CHOLHDL, VLDL, LDLCALC in the last 168 hours.  CBG: Recent Labs  Lab 02/07/19 2144 02/08/19 0800 02/08/19 0804 02/08/19 0824 02/08/19 0841  GLUCAP 105* 47* 44* 60* 82    Microbiology: Results for orders placed or performed during the hospital encounter of 02/02/19  SARS CORONAVIRUS 2 (TAT 6-24 HRS) Nasopharyngeal Nasopharyngeal Swab     Status: None   Collection Time: 02/02/19  9:30 PM   Specimen: Nasopharyngeal Swab  Result Value Ref Range Status   SARS Coronavirus 2 NEGATIVE NEGATIVE Final    Comment: (NOTE) SARS-CoV-2 target nucleic acids are NOT DETECTED. The SARS-CoV-2  RNA is generally detectable in upper and lower respiratory specimens during the acute phase of infection. Negative results do not preclude SARS-CoV-2 infection, do not rule out co-infections with other pathogens, and should not be used as the sole basis for treatment or other patient management decisions. Negative results must be combined with clinical observations, patient history, and epidemiological information. The expected result is Negative. Fact Sheet for Patients: SugarRoll.be Fact Sheet for Healthcare Providers: https://www.woods-mathews.com/ This test is not yet approved or cleared by the [place] FDA and  has been authorized for detection and/or diagnosis of SARS-CoV-2 by FDA under an Emergency Use Authorization (EUA). This EUA will remain  in effect (meaning this test can be used) for the duration of the COVID-19 declaration under Section 56  4(b)(1) of the Act, 21 U.S.C. section 360bbb-3(b)(1), unless the authorization is terminated or revoked sooner. Performed at [facility], [place] [address] [place], [place] [address]     Coagulation Studies: No results for input(s): LABPROT, INR in the last 72 hours.  Imaging: EEG  Result Date: 02/07/2019 Alexis Goodell, MD     02/07/2019  3:58 PM ELECTROENCEPHALOGRAM REPORT Patient: Audrey Chan       Room #: [id number] EEG No. ID: [id number] Age: 68 y.o.        Sex: female Referring Physician: Mansy Report Date:  02/07/2019       Interpreting Physician: Alexis Goodell History: Audrey Chan is an 68 y.o. female with altered mental status Medications: Eliquis, ASA, Neurontin, Insulin, Claritin. Lopressor, Crestor Conditions of Recording:  This is a 21 channel routine scalp EEG performed with bipolar and monopolar montages arranged in accordance to the international 10/20 system of electrode placement. One channel was dedicated to EKG recording. The patient is in the awake and drowsy states. Description:  The background activity is slow and poorly organized.  It is dominated by a low voltage theta activity that is diffusely distributed. Some intermittent underlying delta activity is noted at times as well.  No epileptiform activity is noted.  Hyperventilation was not performed.  Intermittent photic stimulation was performed but failed to illicit any change in the tracing. IMPRESSION: This is an abnormal EEG secondary to general background slowing.  This finding may be seen with a diffuse disturbance that is etiologically nonspecific, but may include a metabolic encephalopathy, among other possibilities.  No epileptiform activity was noted.  Alexis Goodell, MD Neurology [phone] 02/07/2019, 3:52 PM   CT Head Wo Contrast  Result Date: 02/07/2019 CLINICAL DATA:  Altered mental status. Recent diagnosis of liver metastasis. EXAM: CT HEAD WITHOUT CONTRAST TECHNIQUE: Contiguous axial  images were obtained from the base of the skull through the vertex without intravenous contrast. COMPARISON:  Head CT 5 days ago 02/02/2019. Brain MRI 01/01/2019 FINDINGS: Brain: No intracranial hemorrhage, mass effect, or midline shift. Stable brain volume. No hydrocephalus. The basilar cisterns are patent. Unchanged chronic small vessel ischemia. Remote left cerebellar infarct. Small additional cerebellar infarct on MRI last month is not well seen by CT. No evidence of territorial infarct or acute ischemia. No extra-axial or intracranial fluid collection. Vascular: No hyperdense vessel. Skull: No fracture or focal lesion. Sinuses/Orbits: Paranasal sinuses and mastoid air cells are clear. The visualized orbits are unremarkable. Other: None. IMPRESSION: 1. No acute intracranial abnormality. 2. Unchanged atrophy and chronic ischemia. Electronically Signed   By: Keith Rake M.D.   On: 02/07/2019 03:54   MR BRAIN WO CONTRAST  Result Date: 02/07/2019 CLINICAL DATA:  TIA.  Renal failure and stage IV liver cancer. EXAM: MRI HEAD WITHOUT CONTRAST TECHNIQUE: Multiplanar, multiecho pulse sequences of the brain and surrounding structures were obtained without intravenous contrast. COMPARISON:  MRI head 01/01/2019.  CT head 02/07/2019 FINDINGS: Brain: Multiple small areas of acute infarct throughout both cerebellar hemispheres. Small areas of acute/subacute infarct in both cerebral hemispheres involving the frontal and parietal lobes. Extensive chronic microvascular ischemic change throughout the cerebral white matter bilaterally. Chronic infarcts in the cerebellum on the left. Small areas of hemorrhage in the right temporal and parietal lobe, not seen previously. Methemoglobin also noted in the parietal lobe bilaterally due to prior hemorrhage. Methemoglobin also noted in the left cerebellum at site of prior infarct. Ventricle size normal.  No mass lesion or midline shift Vascular: Normal arterial flow voids. Skull  and upper cervical spine: Negative. Cervical fusion hardware noted. Sinuses/Orbits: Negative Other: None IMPRESSION: Numerous small areas of acute or subacute infarct in both cerebral hemispheres and both cerebellar hemispheres. Findings are suggestive of emboli. Extensive chronic ischemic change in the white matter bilaterally. Chronic infarct left cerebellum. Scattered small areas of hemorrhage in the brain which appear to be subacute to chronic with evidence of methemoglobin several areas of hemorrhage. Electronically Signed   By: Franchot Gallo M.D.   On: 02/07/2019 12:08   US Carotid Bilateral  Result Date: 02/07/2019 CLINICAL DATA:  68 year old female with a history of TIA EXAM: BILATERAL CAROTID DUPLEX ULTRASOUND TECHNIQUE: Pearline Cables scale imaging, color Doppler and duplex ultrasound were performed of bilateral carotid and vertebral arteries in the neck. COMPARISON:  None. FINDINGS: Criteria: Quantification of carotid stenosis is based on velocity parameters that correlate the residual internal carotid diameter with NASCET-based stenosis levels, using the diameter of the distal internal carotid lumen as the denominator for stenosis measurement. The following velocity measurements were obtained: RIGHT ICA:  Systolic 94 cm/sec, Diastolic 28 cm/sec CCA:  43 cm/sec SYSTOLIC ICA/CCA RATIO:  2.2 ECA:  43 cm/sec LEFT ICA:  Systolic 88 cm/sec, Diastolic 29 cm/sec CCA:  42 cm/sec SYSTOLIC ICA/CCA RATIO:  2.1 ECA:  34 cm/sec Right Brachial SBP: Not acquired Left Brachial SBP: Not acquired RIGHT CAROTID ARTERY: No significant calcified disease of the right common carotid artery. Intermediate waveform maintained. Heterogeneous plaque without significant calcifications at the right carotid bifurcation. Low resistance waveform of the right ICA. No significant tortuosity. RIGHT VERTEBRAL ARTERY: Antegrade flow with low resistance waveform. LEFT CAROTID ARTERY: No significant calcified disease of the left common carotid  artery. Intermediate waveform maintained. Heterogeneous plaque at the left carotid bifurcation without significant calcifications. Low resistance waveform of the left ICA. LEFT VERTEBRAL ARTERY:  Antegrade flow with low resistance waveform. Additional: Incidental bilateral thyroid nodules. IMPRESSION: Mild heterogeneous plaque at the bilateral carotid bifurcation, with discordant results regarding degree of stenosis by established duplex criteria. Peak velocity suggests no significant stenosis, with the ICA/ CCA ratio suggesting a greater degree of stenosis, bilaterally. If establishing a more accurate degree of stenosis is required, cerebral angiogram should be considered, or as a second best test, CTA. Bilateral thyroid nodules. Referral for a dedicated thyroid ultrasound may be considered. Signed, Dulcy Fanny. Dellia Nims, RPVI [facility] Radiology Electronically Signed   By: Corrie Mckusick D.O.   On: 02/07/2019 12:18   DG Chest Portable 1 View  Result Date: 02/07/2019 CLINICAL DATA:  Hypoxia. EXAM: PORTABLE CHEST 1 VIEW COMPARISON:  Chest radiograph and CTA 5 days ago 02/02/2019 FINDINGS: Lower lung volumes from prior exam. Emphysema. Small pulmonary  nodules on prior CT are not well visualized radiographically. No pulmonary edema, focal airspace disease, pleural effusion or pneumothorax. No acute osseous abnormalities are seen. IMPRESSION: 1. Emphysema without acute abnormality. 2. Small pulmonary nodules on prior CT are not well visualized radiographically. Electronically Signed   By: Keith Rake M.D.   On: 02/07/2019 03:44    Medications:  I have reviewed the patient's current medications. Scheduled: . apixaban  5 mg Oral BID  . aspirin EC  81 mg Oral Daily  . cholecalciferol  1,000 Units Oral Daily  . ferrous sulfate  325 mg Oral BID WC  . gabapentin  400 mg Oral TID  . insulin aspart  0-9 Units Subcutaneous TID WC  . loratadine  10 mg Oral  Daily  . metoprolol tartrate  12.5 mg Oral BID  . multivitamin with minerals  1 tablet Oral Daily  . rosuvastatin  20 mg Oral Daily    Assessment/Plan: 68 y.o. female with a history of multiple medical problems including recent PE on Eliquis with subsequent acute on chronic hypoxic respiratory failure for which she was admitted here on 12/14 and discharged on 12/17. The patient underwent pulmonary thrombectomy on 12/2 by Dr. Lucky Cowboy. Sister reports she has been altered for the past week but it seemed to be worse on yesterday.  Brought in for evaluation.  MRI of the brain reviewed and reveals multiple acute infarcts bilaterally and in multiple vascular distributions.  Embolic etiology.  Per sister patient has been compliant with Eliquis.  Carotid doppler not conclusive. Echocardiogram shows EF of  55-60% with no cardiac source of emboli noted. A1c 8.9.  EEG shows no epileptiform activity is noted.    Recommendations: 1. Fasting lipid panel 2. PT consult, OT consult, Speech consult 3. Continue Eliquis and ASA  4. Telemetry monitoring 5. Frequent neuro checks 6. Blood sugar management with target A1c<7.0    LOS: 1 day   Alexis Goodell, MD Neurology [phone] 02/08/2019  10:32 AM

## 2019-02-09 DIAGNOSIS — N1831 Chronic kidney disease, stage 3a: Secondary | ICD-10-CM

## 2019-02-09 DIAGNOSIS — G894 Chronic pain syndrome: Secondary | ICD-10-CM

## 2019-02-09 DIAGNOSIS — R7309 Other abnormal glucose: Secondary | ICD-10-CM

## 2019-02-09 LAB — LIPID PANEL
Cholesterol: 152 mg/dL (ref 0–200)
HDL: 59 mg/dL (ref >40)
LDL Cholesterol: 51 mg/dL (ref 0–99)
Total CHOL/HDL Ratio: 2.6 RATIO
Triglycerides: 210 mg/dL — ABNORMAL HIGH (ref <150)
VLDL: 42 mg/dL — ABNORMAL HIGH (ref 0–40)

## 2019-02-09 LAB — BASIC METABOLIC PANEL
Anion gap: 10 (ref 5–15)
BUN: 26 mg/dL — ABNORMAL HIGH (ref 8–23)
CO2: 20 mmol/L — ABNORMAL LOW (ref 22–32)
Calcium: 8.2 mg/dL — ABNORMAL LOW (ref 8.9–10.3)
Chloride: 105 mmol/L (ref 98–111)
Creatinine, Ser: 1.23 mg/dL — ABNORMAL HIGH (ref 0.44–1.00)
GFR calc Af Amer: 52 mL/min — ABNORMAL LOW (ref >60)
GFR calc non Af Amer: 45 mL/min — ABNORMAL LOW (ref >60)
Glucose, Bld: 316 mg/dL — ABNORMAL HIGH (ref 70–99)
Potassium: 5.1 mmol/L (ref 3.5–5.1)
Sodium: 135 mmol/L (ref 135–145)

## 2019-02-09 LAB — GLUCOSE, CAPILLARY
Glucose-Capillary: 267 mg/dL — ABNORMAL HIGH (ref 70–99)
Glucose-Capillary: 317 mg/dL — ABNORMAL HIGH (ref 70–99)
Glucose-Capillary: 213 mg/dL — ABNORMAL HIGH (ref 70–99)
Glucose-Capillary: 203 mg/dL — ABNORMAL HIGH (ref 70–99)

## 2019-02-09 MED ORDER — SODIUM CHLORIDE 0.9 % IV SOLN: INTRAVENOUS | Status: DC

## 2019-02-09 NOTE — Plan of Care (Signed)
Just tried to toilet pt again - she told NT that she didn't need to go.  When I was with pt, 2 hours ago, she would open her eyes when I said her name, and then would close them again and wouldn't respond to my questions and refused her meds.

## 2019-02-09 NOTE — Progress Notes (Signed)
PT Cancellation Note  Patient Details Name: Audrey Chan MRN: [id number] DOB: 15-Jun-1950   Cancelled Treatment:    Reason Eval/Treat Not Completed: Fatigue/lethargy limiting ability to participate   Attempted x 2 this am.  Pt asleep and does not awaken to voice x 2.  Will continue as appropriate.   Chesley Noon 02/09/2019, 11:10 AM

## 2019-02-09 NOTE — Progress Notes (Addendum)
PROGRESS NOTE    Audrey Chan  [id number] DOB: 28-Jun-1950 DOA: 02/07/2019  PCP: Theotis Burrow, MD    LOS - 2   Brief Narrative:  68 y.o.femalewith a history of recent PE with subsequent acute on chronic hypoxic respiratory failure for which she was admitted here on 12/14 and discharged on 12/17, underwent pulmonary thrombectomy on 12/2 by Dr. Lucky Cowboy. Presented to the ED after her sister found her to be altered, acutely confused and nonsensical speech.  She checked her blood glucose and it was elevated, but patient later reported that she had been having hypoglycemia (40s to 50s) recently. No witnessed seizure-like activity.  No loss of consciousness. In the ED, mildly hypertensive, afebrile, initially required non-rebreather at 100% oxygen, weaned to nasal cannula.  Labs showed mild hyponatremia, bicarb 17, glucose 368, BUN 40, creatinine 1.82.  Ammonia level 36.  Normal anion gap.  Initially potassium 6.5 but was hemolyzed sample, repeat normal.  CBC showed leukocytosis of 13.5 with neutrophilia, and anemia. UA showed more than 500 glucose and UDS negative. Head CT scan revealed unchanged atrophy and chronic ischemia with no acute intracranial abnormalities.  Patient was given Narcan in the ED without initial improvement, but eventually did appear back to baseline.  Admitted for further evaluation and workup of altered mental status.  Neurology consulted.  Evaluated for possible seizure vs CVA/TIA.  EEG showed  no epileptiform activity.  Echocardiogram shows EF of 55-60% with no cardiac source of emboli noted.  Carotid dopplers inconclusive for significant stenosis.  MRI brain showed scattered infarcts in multiple distributions, consistent with embolic strokes.  Lipid panel notable for triglycerides 210.  Patient has been on Eliquis for acute PE and she and her sister report compliance taking it.  Patient has been intermittently confused and with mildly slurred speech at times.   Evaluated by PT, recommend SNF.  SLP evaluated swallow and recommend dysphagia-2 diet at this time.  Of note, this is patient's third episode of CVA this year, first in August, 2nd last month and this admission.  Per her sister, prior to this admission, patient was independent with ambulation and most ADL's.  Sister was helping with her medications and insulin.  Sister confirms labile blood sugars have been an issue for quite some time.  Subjective 12/20: Patient sleeping comfortably but awoke to voice.  She denies pain or discomfort.  To other questions about any symptoms she repeatedly answered "uh uh".  Still confused and oriented only to self.  No acute events reported.  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  No embolic source identified as of yet, see above narrative for workup to date.   --Neurology following --continue Eliquis and ASA --neuro checks --telemetry --glucose control, goal A1C < 7.0%  Acute encephalopathy secondary to above - improving  Pulmonary embolism, recent, s/p thrombectomy, on Eliquis --stable respiratory status at this time --continue Eliquis  Labile Blood Glucose with Hypoglycemic episodes, chronic per patient's sister Type 2 diabetes mellitus --sensitive sliding scale coverage  --hold off her Lantus for now  --CBG's and hypoglyemia protocol  Dyslipidemia Continue statin   Peripheral neuropathy --  continue gabapentin  Liver metastases, unknown primary.  Incidental finding on recent imaging when patient admitted for acute PE. --further evaluation pending but on  hold due to acute problems above  AKI on CKD Stage IIIa - present on admission.   AKI resolved. Baseline creatine appears to be around 1.2-1.3, was 1.82 on admission. Likely due to poor PO intake, prerenal azotemia.      DVT prophylaxis: On Eliquis   Code Status: Full Code  Family Communication: Sister updated by phone today Disposition Plan: PT recommending SNF, medically ready in 1 to 2 days  Consultants:   Neurology  Procedures:   Echo  EEG  Antimicrobials:   None     Objective: Vitals:   02/08/19 1121 02/08/19 1410 02/09/19 0448 02/09/19 0910  BP: (!) 140/94 124/76 (!) 142/81   Pulse: 91 82 (!) 102   Resp: 17 18 18    Temp: 97.9 F (36.6 C) 98.3 F (36.8 C) 98.5 F (36.9 C)   TempSrc: Oral Oral Oral   SpO2: 96% 92% 95% (!) 87%  Weight:      Height:        Intake/Output Summary (Last 24 hours) at 02/09/2019 1230 Last data filed at 02/09/2019 0936 Gross per 24 hour  Intake 360 ml  Output 1000 ml  Net -640 ml   Filed Weights   02/07/19 0313  Weight: 60.3 kg    Examination:  General exam: Sleeping comfortably, awakes to voice, no acute distress Respiratory system: clear but diminished bilaterally, no wheezes, rales or rhonchi, normal respiratory effort. Cardiovascular system: normal S1/S2, RRR, no JVD, murmurs, rubs, gallops, no pedal edema.   Central nervous system: Oriented to self, normal but delayed speech Extremities: no edema, normal tone Skin: dry, intact, normal temperature Psychiatry: normal mood, congruent affect    Data Reviewed: I have personally reviewed following labs and imaging studies  CBC: Recent Labs  Lab 02/02/19 1929 02/03/19 0454 02/04/19 0446 02/07/19 0414 02/08/19 0454  WBC 10.8* 9.8 7.5 13.5* 10.3  NEUTROABS 7.8*  --   --  12.1*  --   HGB 9.6* 9.7* 10.1* 10.3* 10.4*  HCT 28.8* 28.8* 30.0* 33.1* 33.7*  MCV 67.4* 67.4* 67.9* 73.7* 73.3*  PLT 374 376 407* 391 [id number]   Basic Metabolic Panel: Recent Labs  Lab  02/05/19 2123 02/06/19 0625 02/07/19 0414 02/07/19 0536 02/08/19 0454 02/09/19 0515  NA 133* 136 134*  --  141 135  K 5.4* 4.5 6.5* 4.6 5.0 5.1  CL 103 106 105  --  112* 105  CO2 18* 18* 17*  --  19* 20*  GLUCOSE 556* 194* 368*  --  72 316*  BUN 29* 30* 40*  --  31* 26*  CREATININE 1.71* 1.55* 1.82*  --  1.25* 1.23*  CALCIUM 8.7* 8.8* 8.1*  --  8.1* 8.2*  MG  --   --   --  1.9  --   --   PHOS  --   --   --  4.3  --   --    GFR: Estimated Creatinine Clearance: 41.1 mL/min (A) (by C-G formula based on SCr of 1.23 mg/dL (H)). Liver Function Tests: Recent Labs  Lab 02/02/19 1929  AST 130*  ALT 92*  ALKPHOS 1,819*  BILITOT 1.0  PROT 7.4  ALBUMIN 3.2*   No results for input(s): LIPASE, AMYLASE in the last 168 hours. Recent Labs  Lab 02/03/19 1057 02/07/19 0414  AMMONIA 19 36*   Coagulation Profile: Recent Labs  Lab  02/02/19 1929  INR 1.6*   Cardiac Enzymes: No results for input(s): CKTOTAL, CKMB, CKMBINDEX, TROPONINI in the last 168 hours. BNP (last 3 results) No results for input(s): PROBNP in the last 8760 hours. HbA1C: No results for input(s): HGBA1C in the last 72 hours. CBG: Recent Labs  Lab 02/08/19 1140 02/08/19 1631 02/08/19 2125 02/09/19 0807 02/09/19 1150  GLUCAP 182* 219* 174* 267* 317*   Lipid Profile: Recent Labs    02/09/19 0515  CHOL 152  HDL 59  LDLCALC 51  TRIG 210*  CHOLHDL 2.6   Thyroid Function Tests: No results for input(s): TSH, T4TOTAL, FREET4, T3FREE, THYROIDAB in the last 72 hours. Anemia Panel: No results for input(s): VITAMINB12, FOLATE, FERRITIN, TIBC, IRON, RETICCTPCT in the last 72 hours. Sepsis Labs: Recent Labs  Lab 02/03/19 1057  LATICACIDVEN 2.4*    Recent Results (from the past 240 hour(s))  SARS CORONAVIRUS 2 (TAT 6-24 HRS) Nasopharyngeal Nasopharyngeal Swab     Status: None   Collection Time: 02/02/19  9:30 PM   Specimen: Nasopharyngeal Swab  Result Value Ref Range Status   SARS Coronavirus 2 NEGATIVE  NEGATIVE Final    Comment: (NOTE) SARS-CoV-2 target nucleic acids are NOT DETECTED. The SARS-CoV-2 RNA is generally detectable in upper and lower respiratory specimens during the acute phase of infection. Negative results do not preclude SARS-CoV-2 infection, do not rule out co-infections with other pathogens, and should not be used as the sole basis for treatment or other patient management decisions. Negative results must be combined with clinical observations, patient history, and epidemiological information. The expected result is Negative. Fact Sheet for Patients: SugarRoll.be Fact Sheet for Healthcare Providers: https://www.woods-mathews.com/ This test is not yet approved or cleared by the [place] FDA and  has been authorized for detection and/or diagnosis of SARS-CoV-2 by FDA under an Emergency Use Authorization (EUA). This EUA will remain  in effect (meaning this test can be used) for the duration of the COVID-19 declaration under Section 56 4(b)(1) of the Act, 21 U.S.C. section 360bbb-3(b)(1), unless the authorization is terminated or revoked sooner. Performed at [facility], [place] [address], [place], [place] [address]          Radiology Studies: EEG  Result Date: 02/07/2019 Alexis Goodell, MD     02/07/2019  3:58 PM ELECTROENCEPHALOGRAM REPORT Patient: Audrey Chan       Room #: [id number] EEG No. ID: [id number] Age: 68 y.o.        Sex: female Referring Physician: Mansy Report Date:  02/07/2019       Interpreting Physician: Alexis Goodell History: KINETA BURBANK is an 68 y.o. female with altered mental status Medications: Eliquis, ASA, Neurontin, Insulin, Claritin. Lopressor, Crestor Conditions of Recording:  This is a 21 channel routine scalp EEG performed with bipolar and monopolar montages arranged in accordance to the international 10/20 system of electrode placement. One channel was dedicated to EKG recording.  The patient is in the awake and drowsy states. Description:  The background activity is slow and poorly organized.  It is dominated by a low voltage theta activity that is diffusely distributed. Some intermittent underlying delta activity is noted at times as well.  No epileptiform activity is noted.  Hyperventilation was not performed.  Intermittent photic stimulation was performed but failed to illicit any change in the tracing. IMPRESSION: This is an abnormal EEG secondary to general background slowing.  This finding may be seen with a diffuse disturbance that is etiologically nonspecific, but may  include a metabolic encephalopathy, among other possibilities.  No epileptiform activity was noted.  Alexis Goodell, MD Neurology [phone] 02/07/2019, 3:52 PM        Scheduled Meds: . apixaban  5 mg Oral BID  . aspirin EC  81 mg Oral Daily  . cholecalciferol  1,000 Units Oral Daily  . ferrous sulfate  325 mg Oral BID WC  . gabapentin  400 mg Oral TID  . insulin aspart  0-9 Units Subcutaneous TID WC  . loratadine  10 mg Oral Daily  . metoprolol tartrate  12.5 mg Oral BID  . multivitamin with minerals  1 tablet Oral Daily  . rosuvastatin  20 mg Oral Daily   Continuous Infusions: . sodium chloride       LOS: 2 days    Time spent: 30-35 minutes    Ezekiel Slocumb, DO [facility]   If 7PM-7AM, please contact night-coverage [email or web address] Password [email or web address] 02/09/2019, 12:30 PM

## 2019-02-10 DIAGNOSIS — Z794 Long term (current) use of insulin: Secondary | ICD-10-CM

## 2019-02-10 DIAGNOSIS — E1142 Type 2 diabetes mellitus with diabetic polyneuropathy: Secondary | ICD-10-CM

## 2019-02-10 DIAGNOSIS — E119 Type 2 diabetes mellitus without complications: Secondary | ICD-10-CM

## 2019-02-10 LAB — BASIC METABOLIC PANEL
Anion gap: 18 — ABNORMAL HIGH (ref 5–15)
BUN: 28 mg/dL — ABNORMAL HIGH (ref 8–23)
CO2: 14 mmol/L — ABNORMAL LOW (ref 22–32)
Calcium: 8.6 mg/dL — ABNORMAL LOW (ref 8.9–10.3)
Chloride: 104 mmol/L (ref 98–111)
Creatinine, Ser: 1.37 mg/dL — ABNORMAL HIGH (ref 0.44–1.00)
GFR calc Af Amer: 45 mL/min — ABNORMAL LOW (ref >60)
GFR calc non Af Amer: 39 mL/min — ABNORMAL LOW (ref >60)
Glucose, Bld: 349 mg/dL — ABNORMAL HIGH (ref 70–99)
Potassium: 5.8 mmol/L — ABNORMAL HIGH (ref 3.5–5.1)
Sodium: 136 mmol/L (ref 135–145)

## 2019-02-10 LAB — GLUCOSE, CAPILLARY
Glucose-Capillary: 336 mg/dL — ABNORMAL HIGH (ref 70–99)
Glucose-Capillary: 231 mg/dL — ABNORMAL HIGH (ref 70–99)
Glucose-Capillary: 158 mg/dL — ABNORMAL HIGH (ref 70–99)
Glucose-Capillary: 120 mg/dL — ABNORMAL HIGH (ref 70–99)

## 2019-02-10 LAB — MAGNESIUM: Magnesium: 1.9 mg/dL (ref 1.7–2.4)

## 2019-02-10 LAB — POTASSIUM: Potassium: 4.7 mmol/L (ref 3.5–5.1)

## 2019-02-10 LAB — SARS CORONAVIRUS 2 (TAT 6-24 HRS): SARS Coronavirus 2: NEGATIVE

## 2019-02-10 MED ORDER — FUROSEMIDE 10 MG/ML IJ SOLN
40.0000 mg | Freq: Once | INTRAMUSCULAR | Status: AC
  Administered 2019-02-10: 40 mg via INTRAVENOUS
  Filled 2019-02-10: qty 4

## 2019-02-10 MED ORDER — BLISTEX MEDICATED EX OINT
TOPICAL_OINTMENT | CUTANEOUS | Status: DC | PRN
  Filled 2019-02-10: qty 6.3

## 2019-02-10 MED ORDER — INSULIN GLARGINE 100 UNIT/ML ~~LOC~~ SOLN
10.0000 [IU] | Freq: Every day | SUBCUTANEOUS | Status: DC
  Filled 2019-02-10: qty 0.1

## 2019-02-10 MED ORDER — INSULIN ASPART 100 UNIT/ML ~~LOC~~ SOLN
3.0000 [IU] | Freq: Three times a day (TID) | SUBCUTANEOUS | Status: DC
  Administered 2019-02-10 – 2019-02-11 (×5): 3 [IU] via SUBCUTANEOUS
  Filled 2019-02-10 (×4): qty 1

## 2019-02-10 MED ORDER — INSULIN GLARGINE 100 UNIT/ML ~~LOC~~ SOLN
15.0000 [IU] | Freq: Every day | SUBCUTANEOUS | Status: DC
  Administered 2019-02-11: 15 [IU] via SUBCUTANEOUS
  Filled 2019-02-10 (×4): qty 0.15

## 2019-02-10 NOTE — Progress Notes (Signed)
PROGRESS NOTE    Audrey Chan  [id number] DOB: [birth year] DOA: 02/07/2019  PCP: Theotis Burrow, MD    LOS [facility] Course To Date:  68 y.o.femalewith a history of recent PE with subsequent acute on chronic hypoxic respiratory failure for which she was admitted here on 12/14 and discharged on 12/17, underwentpulmonary thrombectomy on 12/2 by Dr. Lucky Cowboy.Presented to the ED after her sister found her to be altered, acutely confused and nonsensical speech.She checked her blood glucose and it was elevated, but patient later reported thatshe hadbeen having hypoglycemia (40s to 65s) recently.Nowitnessed seizure-like activity.No loss of consciousness. In the ED, mildly hypertensive, afebrile, initially required non-rebreather at 100% oxygen, weaned to nasal cannula. Labs showed mild hyponatremia, bicarb 17, glucose 368, BUN 40, creatinine 1.82. Ammonia level 36. Normal anion gap. Initially potassium 6.5 but was hemolyzed sample, repeat normal. CBC showed leukocytosis of 13.5 with neutrophilia, and anemia.UAshowed more than 500 glucose andUDSnegative. Head CT scan revealed unchanged atrophy and chronic ischemia with no acute intracranial abnormalities.Patient was given Narcan in the ED without initial improvement, but eventually did appear back to baseline. Admitted for further evaluation and workup of altered mental status. Neurology consulted. Evaluated for possible seizure vs CVA/TIA. EEG showed no epileptiform activity. Echocardiogram shows EF of 55-60% with no cardiac source of emboli noted. Carotid dopplers inconclusive for significant stenosis. MRI brain showed scattered infarcts in multiple distributions, consistent with embolic strokes.  Lipid panel notable for triglycerides 210. Patient has been on Eliquis for acute PE and she and her sister report compliance taking it. Patient has been intermittently confused and with mildly slurred speech at  times. Evaluated by PT, recommend SNF.  SLP evaluated swallow and recommend dysphagia-2 diet at this time.  Of note, this is patient's third episode of CVA this year, first in August, 2nd last month and this admission.  Per her sister, prior to this admission, patient was independent with ambulation and most ADL's.  Sister was helping with her medications and insulin.  Sister confirms labile blood sugars have been an issue for quite some time.  Subjective 12/21: Patient sleeping when seen this AM.  Did awake to voice, but only opens eyes for 1-2 seconds when asked.  Does shake head no when asked if any pain or discomfort.  Nods yes when asked if feeling okay.  No acute events reported.   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 No embolic source identified as of yet, see above narrative for workup to date.  --Neurology following --continue Eliquis and ASA --neuro checks --telemetry --glucose control, goal A1C < 7.0% --PT recommends SNF  Acute encephalopathysecondary to above - improving  Pulmonary embolism, recent, s/p thrombectomy, on Eliquis --stable respiratory status at this time --continue Eliquis  Labile Blood Glucose with Hypoglycemic episodes, chronic per patient's sister Type 2 diabetes mellitus Now hyperglycemic, glucose > 300 --sensitive sliding scalecoverage --add Novolog 3 units TID WC --increase Lantus to 15 units tonight (usually on 25 units qHS) --CBG's and hypoglyemia protocol  Hyperkalemia - K 5.8 this AM --will give IV lasixand recheck later  today  Dyslipidemia  Continue statin   Peripheral neuropathy --continue gabapentin  Liver metastases, unknown primary.  Incidental finding on recent imaging when patient admitted for acute PE. --further evaluation pending but on hold due to acute problems above  AKI on CKD Stage IIIa - present on admission.   AKI resolved. Baseline creatine appears to be around 1.2-1.3, was 1.82 on admission. Likely due to poor PO intake, prerenal azotemia.      DVT prophylaxis: Eliquis   Code Status: Full Code  Family Communication: none at bedside   Disposition Plan:  SNF pending bed available and neurology clearance for d/c   Consultants:   Neurology  Procedures:   Echo  EEG  Antimicrobials:   None    Objective: Vitals:   02/09/19 0448 02/09/19 0910 02/09/19 1438 02/09/19 2205  BP: (!) 142/81  138/78 126/78  Pulse: (!) 102  90 92  Resp: 18  18 18   Temp: 98.5 F (36.9 C)  98.9 F (37.2 C) 98.4 F (36.9 C)  TempSrc: Oral  Oral Axillary  SpO2: 95% (!) 87% 93% 95%  Weight:      Height:        Intake/Output Summary (Last 24 hours) at 02/10/2019 0726 Last data filed at 02/10/2019 0400 Gross per 24 hour  Intake 1303.03 ml  Output 1300 ml  Net 3.03 ml   Filed Weights   02/07/19 0313  Weight: 60.3 kg    Examination:  General exam: resting comfortably, no acute distress HEENT: dry mucus membranes, hearing grossly normal  Respiratory system: clear to auscultation bilaterally, no wheezes, rales or rhonchi, normal respiratory effort. Cardiovascular system: normal S1/S2, RRR, no JVD, murmurs, rubs, gallops, no pedal edema.   Gastrointestinal system: soft, non-tender, non-distended abdomen Central nervous system: oriented to self, follows commands Extremities: no edema, normal tone Psychiatry: normal mood, congruent affect, judgement and insight appear normal    Data Reviewed: I have personally reviewed following labs and imaging studies  CBC: Recent Labs  Lab  02/04/19 0446 02/07/19 0414 02/08/19 0454  WBC 7.5 13.5* 10.3  NEUTROABS  --  12.1*  --   HGB 10.1* 10.3* 10.4*  HCT 30.0* 33.1* 33.7*  MCV 67.9* 73.7* 73.3*  PLT 407* 391 [id number]   Basic Metabolic Panel: Recent Labs  Lab 02/06/19 0625 02/07/19 0414 02/07/19 0536 02/08/19 0454 02/09/19 0515 02/10/19 0614  NA 136 134*  --  141 135 136  K 4.5 6.5* 4.6 5.0 5.1 5.8*  CL 106 105  --  112* 105 104  CO2 18* 17*  --  19* 20* 14*  GLUCOSE 194* 368*  --  72 316* 349*  BUN 30* 40*  --  31* 26* 28*  CREATININE 1.55* 1.82*  --  1.25* 1.23* 1.37*  CALCIUM 8.8* 8.1*  --  8.1* 8.2* 8.6*  MG  --   --  1.9  --   --  1.9  PHOS  --   --  4.3  --   --   --    GFR: Estimated Creatinine Clearance: 36.9 mL/min (A) (by C-G formula based on SCr of 1.37 mg/dL (H)). Liver Function Tests: No results for input(s): AST, ALT, ALKPHOS, BILITOT, PROT, ALBUMIN in the last 168 hours. No results for input(s): LIPASE, AMYLASE in the last 168 hours. Recent Labs  Lab 02/03/19 1057 02/07/19 0414  AMMONIA 19 36*   Coagulation Profile: No results for input(s): INR, PROTIME in the last 168 hours. Cardiac Enzymes: No results for input(s): CKTOTAL, CKMB, CKMBINDEX, TROPONINI in the  last 168 hours. BNP (last 3 results) No results for input(s): PROBNP in the last 8760 hours. HbA1C: No results for input(s): HGBA1C in the last 72 hours. CBG: Recent Labs  Lab 02/08/19 2125 02/09/19 0807 02/09/19 1150 02/09/19 1701 02/09/19 2119  GLUCAP 174* 267* 317* 213* 203*   Lipid Profile: Recent Labs    02/09/19 0515  CHOL 152  HDL 59  LDLCALC 51  TRIG 210*  CHOLHDL 2.6   Thyroid Function Tests: No results for input(s): TSH, T4TOTAL, FREET4, T3FREE, THYROIDAB in the last 72 hours. Anemia Panel: No results for input(s): VITAMINB12, FOLATE, FERRITIN, TIBC, IRON, RETICCTPCT in the last 72 hours. Sepsis Labs: Recent Labs  Lab 02/03/19 1057  LATICACIDVEN 2.4*    Recent Results (from the past 240 hour(s))   SARS CORONAVIRUS 2 (TAT 6-24 HRS) Nasopharyngeal Nasopharyngeal Swab     Status: None   Collection Time: 02/02/19  9:30 PM   Specimen: Nasopharyngeal Swab  Result Value Ref Range Status   SARS Coronavirus 2 NEGATIVE NEGATIVE Final    Comment: (NOTE) SARS-CoV-2 target nucleic acids are NOT DETECTED. The SARS-CoV-2 RNA is generally detectable in upper and lower respiratory specimens during the acute phase of infection. Negative results do not preclude SARS-CoV-2 infection, do not rule out co-infections with other pathogens, and should not be used as the sole basis for treatment or other patient management decisions. Negative results must be combined with clinical observations, patient history, and epidemiological information. The expected result is Negative. Fact Sheet for Patients: SugarRoll.be Fact Sheet for Healthcare Providers: https://www.woods-mathews.com/ This test is not yet approved or cleared by the [place] FDA and  has been authorized for detection and/or diagnosis of SARS-CoV-2 by FDA under an Emergency Use Authorization (EUA). This EUA will remain  in effect (meaning this test can be used) for the duration of the COVID-19 declaration under Section 56 4(b)(1) of the Act, 21 U.S.C. section 360bbb-3(b)(1), unless the authorization is terminated or revoked sooner. Performed at [facility], [place] [address] [place], [place] [address]          Radiology Studies: No results found.      Scheduled Meds: . apixaban  5 mg Oral BID  . aspirin EC  81 mg Oral Daily  . cholecalciferol  1,000 Units Oral Daily  . ferrous sulfate  325 mg Oral BID WC  . furosemide  40 mg Intravenous Once  . gabapentin  400 mg Oral TID  . insulin aspart  0-9 Units Subcutaneous TID WC  . insulin aspart  3 Units Subcutaneous TID WC  . insulin glargine  10 Units Subcutaneous QHS  . loratadine  10 mg Oral Daily  . metoprolol tartrate  12.5  mg Oral BID  . multivitamin with minerals  1 tablet Oral Daily  . rosuvastatin  20 mg Oral Daily   Continuous Infusions: . sodium chloride 75 mL/hr at 02/10/19 0435     LOS: 3 days    Time spent: 25-30 minutes    Ezekiel Slocumb, DO [facility]   If 7PM-7AM, please contact night-coverage [email or web address] Password [facility] 02/10/2019, 7:26 AM

## 2019-02-10 NOTE — Progress Notes (Signed)
OT Cancellation Note  Patient Details Name: Audrey Chan MRN: [id number] DOB: 08-09-1950   Cancelled Treatment:    Reason Eval/Treat Not Completed: Medical issues which prohibited therapy Thank you for the OT consult. Order received and chart reviewed.  Pt's potassium noted to be up-trending to 5.8 this morning.  Per therapy guidelines for elevated potassium, exertional activity currently contra-indicated.  Will hold OT at this time and re-attempt OT evaluation at a later date/time as medically appropriate.  Shara Blazing, M.S., OTR/L Ascom: [phone] 02/10/19, 11:46 AM

## 2019-02-10 NOTE — NC FL2 (Signed)
[place] LEVEL OF CARE SCREENING TOOL     IDENTIFICATION  Patient Name: Audrey Chan Birthdate: Oct 12, 1950 Sex: female Admission Date (Current Location): 02/07/2019  [place] and [place] Number:  Engineering geologist and Address:  [facility], [address] [place], [place] [address]      Provider Number: [id number]  Attending Physician Name and Address:  Ezekiel Slocumb, DO  Relative Name and Phone Number:  Adelina Mings [phone]    Current Level of Care: Hospital Recommended Level of Care: [place] Prior Approval Number:    Date Approved/Denied:   PASRR Number: [id number]  Discharge Plan: SNF    Current Diagnoses: Patient Active Problem List   Diagnosis Date Noted  . Labile blood glucose 02/09/2019  . Acute encephalopathy 02/07/2019  . Carcinoma of unknown primary ([place]) 02/04/2019  . Palliative care encounter   . Goals of care, counseling/discussion   . Acute on chronic respiratory failure with hypoxia ([name]) 02/02/2019  . History of pulmonary embolism 02/02/2019  . Pulmonary embolism ([place]) 02/02/2019  . Cor pulmonale, acute ([place]) 02/02/2019  . COPD with acute bronchitis ([place]) 02/02/2019  . Elevated troponin 02/02/2019  . Malnutrition of moderate degree 01/23/2019  . Liver lesion 01/21/2019  . Leg DVT (deep venous thromboembolism), acute, bilateral ([place]) 01/21/2019  . Multiple subsegmental pulmonary emboli without acute cor pulmonale ([place]) 01/20/2019  . CKD (chronic kidney disease), stage III 01/20/2019  . Prolonged QT interval 01/20/2019  . Acute respiratory failure with hypoxemia ([place])   . Liver metastases ([place])   . Elevated LFTs   . Uncontrolled hypertension 01/03/2019  . Lobar pneumonia ([place]) 01/03/2019  . Embolic stroke ([place]) [id number]  . DKA (diabetic ketoacidoses) ([place]) 12/30/2018  . Acute metabolic encephalopathy [id number]  . Sepsis with acute renal failure without septic  shock ([place])   . AKI (acute kidney injury) ([place])   . Hyponatremia   . Hyperkalemia   . DDD (degenerative disc disease), lumbar 08/08/2018  . Acute bilateral low back pain with bilateral sciatica 08/08/2018  . Lumbar spondylosis with myelopathy 08/08/2018  . Chronic pain syndrome 08/08/2018  . Lung nodules 01/28/2015  . Supraclavicular adenopathy 01/21/2015  . Abnormal MRI, lumbar spine 01/01/2015  . Weight loss, unintentional 01/01/2015  . Lymphocytosis 01/01/2015  . Microcytic red blood cells 01/01/2015  . Recurrent biliary colic [id number]  . Calculus of gallbladder with acute on chronic cholecystitis without obstruction 12/22/2014  . Encounter for fitting or adjustment of insulin pump 08/09/2014  . Diabetic peripheral neuropathy associated with type 2 diabetes mellitus ([place]) 08/09/2014  . Hypertension 05/24/2012  . Insulin dependent type 2 diabetes mellitus ([place]) 05/24/2012  . Idiopathic localized osteoarthropathy 04/09/2012  . Dermatitis, eczematoid 02/27/2012  . Can't get food down 09/26/2011    Orientation RESPIRATION BLADDER Height & Weight     Self  O2([place] 3 L) Continent Weight: 60.3 kg Height:  5\' 6"  (167.6 cm)  BEHAVIORAL SYMPTOMS/MOOD NEUROLOGICAL BOWEL NUTRITION STATUS      Continent Diet(heart healthy carb modified)  AMBULATORY STATUS COMMUNICATION OF NEEDS Skin   Extensive Assist Verbally Normal                       Personal Care Assistance Level of Assistance  Bathing, Feeding, Dressing Bathing Assistance: Maximum assistance Feeding assistance: Limited assistance Dressing Assistance: Maximum assistance     Functional Limitations Info             SPECIAL CARE FACTORS FREQUENCY  Contractures Contractures Info: Not present    Additional Factors Info  Code Status, Allergies Code Status Info: Full Allergies Info: NKA           Current Medications (02/10/2019):  This is the current hospital active medication  list Current Facility-Administered Medications  Medication Dose Route Frequency Provider Last Rate Last Admin  . 0.9 %  sodium chloride infusion   Intravenous Continuous Ezekiel Slocumb, DO 75 mL/hr at 02/10/19 0435 New Bag at 02/10/19 0435  . acetaminophen (TYLENOL) tablet 650 mg  650 mg Oral Q6H PRN Mansy, Jan A, MD       Or  . acetaminophen (TYLENOL) suppository 650 mg  650 mg Rectal Q6H PRN Mansy, Jan A, MD      . albuterol (PROVENTIL) (2.5 MG/3ML) 0.083% nebulizer solution 2.5 mg  2.5 mg Inhalation Q6H PRN Mansy, Jan A, MD      . apixaban (ELIQUIS) tablet 5 mg  5 mg Oral BID Mansy, Jan A, MD   5 mg at 02/09/19 [id number]  . aspirin EC tablet 81 mg  81 mg Oral Daily Mansy, Jan A, MD   81 mg at 02/09/19 [id number]  . cholecalciferol (VITAMIN D3) tablet 1,000 Units  1,000 Units Oral Daily Mansy, Arvella Merles, MD   1,000 Units at 02/09/19 [phone]  . ferrous sulfate tablet 325 mg  325 mg Oral BID WC Mansy, Jan A, MD   325 mg at 02/09/19 1715  . fluticasone (FLONASE) 50 MCG/ACT nasal spray 2 spray  2 spray Each Nare Daily PRN Mansy, Jan A, MD      . gabapentin (NEURONTIN) capsule 400 mg  400 mg Oral TID Mansy, Jan A, MD   400 mg at 02/09/19 1715  . insulin aspart (novoLOG) injection 0-9 Units  0-9 Units Subcutaneous TID WC Mansy, Arvella Merles, MD   7 Units at 02/10/19 [phone]  . insulin aspart (novoLOG) injection 3 Units  3 Units Subcutaneous TID WC Nicole Kindred A, DO   3 Units at 02/10/19 [phone]  . insulin glargine (LANTUS) injection 10 Units  10 Units Subcutaneous QHS Nicole Kindred A, DO      . ipratropium-albuterol (DUONEB) 0.5-2.5 (3) MG/3ML nebulizer solution 3 mL  3 mL Nebulization Q6H PRN Mansy, Jan A, MD      . loratadine (CLARITIN) tablet 10 mg  10 mg Oral Daily Mansy, Jan A, MD   10 mg at 02/09/19 0920  . LORazepam (ATIVAN) injection 1 mg  1 mg Intravenous Q1H PRN Mansy, Jan A, MD      . magnesium hydroxide (MILK OF MAGNESIA) suspension 30 mL  30 mL Oral Daily PRN Mansy, Jan A, MD      . metoprolol tartrate  (LOPRESSOR) tablet 12.5 mg  12.5 mg Oral BID Mansy, Jan A, MD   12.5 mg at 02/09/19 0920  . multivitamin with minerals tablet 1 tablet  1 tablet Oral Daily Mansy, Jan A, MD   1 tablet at 02/09/19 [id number]  . polyethylene glycol (MIRALAX / GLYCOLAX) packet 17 g  17 g Oral Daily PRN Mansy, Jan A, MD      . rosuvastatin (CRESTOR) tablet 20 mg  20 mg Oral Daily Mansy, Jan A, MD   20 mg at 02/09/19 [id number]     Discharge Medications: Please see discharge summary for a list of discharge medications.  Relevant Imaging Results:  Relevant Lab Results:   Additional Information SSN [id number]  Shelbie Hutching, RN

## 2019-02-10 NOTE — TOC Progression Note (Signed)
Transition of Care [facility]) - Progression Note    Patient Details  Name: Audrey Chan MRN: [id number] Date of Birth: 08-18-1950  Transition of Care [facility]) CM/SW Contact  Shelbie Hutching, RN Phone Number: 02/10/2019, 2:58 PM  Clinical Narrative:    When medically stable patient will discharge to [facility].  Claiborne Billings with admissions at [facility] started insurance authorization today.    Expected Discharge Plan: [place] Barriers to Discharge: Continued Medical Work up  Expected Discharge Plan and Services Expected Discharge Plan: [place]   Discharge Planning Services: CM Consult Post Acute Care Choice: [place] Living arrangements for the past 2 months: Single Family Home                                       Social Determinants of Health (SDOH) Interventions    Readmission Risk Interventions Readmission Risk Prevention Plan 01/03/2019  Transportation Screening Complete  PCP or Specialist Appt within 5-7 Days Complete  Home Care Screening Complete  Medication Review (RN CM) Complete  Some recent data might be hidden

## 2019-02-10 NOTE — TOC Progression Note (Signed)
Transition of Care [facility]) - Progression Note    Patient Details  Name: Audrey Chan MRN: [id number] Date of Birth: 1950-10-25  Transition of Care [facility]) CM/SW Contact  Shelbie Hutching, RN Phone Number: 02/10/2019, 11:55 AM  Clinical Narrative:    Patient's sister [name] agrees to SNF at [facility].     Expected Discharge Plan: [place] Barriers to Discharge: Continued Medical Work up  Expected Discharge Plan and Services Expected Discharge Plan: [place]   Discharge Planning Services: CM Consult Post Acute Care Choice: [place] Living arrangements for the past 2 months: Single Family Home                                       Social Determinants of Health (SDOH) Interventions    Readmission Risk Interventions Readmission Risk Prevention Plan 01/03/2019  Transportation Screening Complete  PCP or Specialist Appt within 5-7 Days Complete  Home Care Screening Complete  Medication Review (RN CM) Complete  Some recent data might be hidden

## 2019-02-10 NOTE — Progress Notes (Signed)
PT Cancellation Note  Patient Details Name: Audrey Chan MRN: [id number] DOB: 10-13-1950   Cancelled Treatment:    Reason Eval/Treat Not Completed: Medical issues which prohibited therapy.  Chart reviewed.  Pt's potassium noted to be up-trending to 5.8 this morning.  Per PT guidelines for elevated potassium, exertional activity currently contra-indicated.  Will hold PT at this time and re-attempt PT session at a later date/time as medically appropriate.  [name], PT 02/10/19, 11:31 AM

## 2019-02-10 NOTE — TOC Initial Note (Signed)
Transition of Care [facility]) - Initial/Assessment Note    Patient Details  Name: Audrey Chan MRN: [id number] Date of Birth: Jul 17, 1950  Transition of Care [facility]) CM/SW Contact:    Shelbie Hutching, RN Phone Number: 02/10/2019, 9:29 AM  Clinical Narrative:                 Patient admitted with altered mental status, found to have acute embolic stroke.  Patient is from home, where her sister lives with her.  Patient was discharged on 12/17 and returned to the emergency department on 12/18 with worsening altered mental status and hyperglycemia.  Patient is open with [facility] home health, Jana Half with [facility] is aware of admission.   PT has recommended skilled nursing rehab, bed search started.  RNCM will review bed offers with patient's sister.    Expected Discharge Plan: Skilled Nursing Facility Barriers to Discharge: Continued Medical Work up   Patient Goals and CMS Choice   CMS Medicare.gov Compare Post Acute Care list provided to:: Patient Represenative (must comment) Choice offered to / list presented to : Sibling  Expected Discharge Plan and Services Expected Discharge Plan: [place]   Discharge Planning Services: CM Consult Post Acute Care Choice: [place] Living arrangements for the past 2 months: Single Family Home                                      Prior Living Arrangements/Services Living arrangements for the past 2 months: Single Family Home Lives with:: Siblings Patient language and need for interpreter reviewed:: Yes        Need for Family Participation in Patient Care: Yes (Comment)(cancer) Care giver support system in place?: Yes (comment)(sister) Current home services: DME, Home OT, Homehealth aide, Home PT, Home RN(oxygen, nebulizer) Criminal Activity/Legal Involvement Pertinent to Current Situation/Hospitalization: No - Comment as needed  Activities of Daily Living Home Assistive Devices/Equipment:  Eyeglasses ADL Screening (condition at time of admission) Patient's cognitive ability adequate to safely complete daily activities?: No Is the patient deaf or have difficulty hearing?: No Does the patient have difficulty seeing, even when wearing glasses/contacts?: No Does the patient have difficulty concentrating, remembering, or making decisions?: Yes Patient able to express need for assistance with ADLs?: No Does the patient have difficulty dressing or bathing?: Yes Independently performs ADLs?: No Does the patient have difficulty walking or climbing stairs?: Yes Weakness of Legs: Both Weakness of Arms/Hands: None  Permission Sought/Granted Permission sought to share information with : Case Manager, Family Supports, Chartered certified accountant granted to share information with : Yes, Verbal Permission Granted     Permission granted to share info w AGENCY: SNF in [place] granted to share info w Relationship: sister     Emotional Assessment Appearance:: Appears stated age     Orientation: : Oriented to Self Alcohol / Substance Use: Not Applicable Psych Involvement: No (comment)  Admission diagnosis:  TIA (transient ischemic attack) [G45.9] Acute encephalopathy [G93.40] Altered mental status, unspecified altered mental status type [R41.82] Patient Active Problem List   Diagnosis Date Noted  . Labile blood glucose 02/09/2019  . Acute encephalopathy 02/07/2019  . Carcinoma of unknown primary ([place]) 02/04/2019  . Palliative care encounter   . Goals of care, counseling/discussion   . Acute on chronic respiratory failure with hypoxia ([place]) 02/02/2019  . History of pulmonary embolism 02/02/2019  . Pulmonary embolism ([place]) 02/02/2019  .  Cor pulmonale, acute ([place]) 02/02/2019  . COPD with acute bronchitis ([place]) 02/02/2019  . Elevated troponin 02/02/2019  . Malnutrition of moderate degree 01/23/2019  . Liver lesion 01/21/2019  . Leg DVT (deep  venous thromboembolism), acute, bilateral ([place]) 01/21/2019  . Multiple subsegmental pulmonary emboli without acute cor pulmonale ([place]) 01/20/2019  . CKD (chronic kidney disease), stage III 01/20/2019  . Prolonged QT interval 01/20/2019  . Acute respiratory failure with hypoxemia ([place])   . Liver metastases ([place])   . Elevated LFTs   . Uncontrolled hypertension 01/03/2019  . Lobar pneumonia ([place]) 01/03/2019  . Embolic stroke ([place]) [id number]  . DKA (diabetic ketoacidoses) ([place]) 12/30/2018  . Acute metabolic encephalopathy [id number]  . Sepsis with acute renal failure without septic shock ([place])   . AKI (acute kidney injury) ([place])   . Hyponatremia   . Hyperkalemia   . DDD (degenerative disc disease), lumbar 08/08/2018  . Acute bilateral low back pain with bilateral sciatica 08/08/2018  . Lumbar spondylosis with myelopathy 08/08/2018  . Chronic pain syndrome 08/08/2018  . Lung nodules 01/28/2015  . Supraclavicular adenopathy 01/21/2015  . Abnormal MRI, lumbar spine 01/01/2015  . Weight loss, unintentional 01/01/2015  . Lymphocytosis 01/01/2015  . Microcytic red blood cells 01/01/2015  . Recurrent biliary colic [id number]  . Calculus of gallbladder with acute on chronic cholecystitis without obstruction 12/22/2014  . Encounter for fitting or adjustment of insulin pump 08/09/2014  . Diabetic peripheral neuropathy associated with type 2 diabetes mellitus ([place]) 08/09/2014  . Hypertension 05/24/2012  . Insulin dependent type 2 diabetes mellitus ([place]) 05/24/2012  . Idiopathic localized osteoarthropathy 04/09/2012  . Dermatitis, eczematoid 02/27/2012  . Can't get food down 09/26/2011   PCP:  Revelo, Elyse Jarvis, MD Pharmacy:   [facility] [address] ([address]), [place] [employer]) [place] [address] Phone: [phone] Fax: [phone]     Social Determinants of Health ([place]) Interventions    Readmission Risk  Interventions Readmission Risk Prevention Plan 01/03/2019  Transportation Screening Complete  PCP or Specialist Appt within 5-7 Days Complete  Home Care Screening Complete  Medication Review (RN CM) Complete  Some recent data might be hidden

## 2019-02-10 NOTE — Progress Notes (Signed)
Pt still lethargic awakening for only brief moments. Altered mental status when awake. Rec dysphagia 2 diet for ease of chewing and swallowing, to be fed only when alert. No reported dysphagia with thin liquids. ST to follow up 1-3 days in hopes of improved mental status. Speech and langauge eval as appropriate

## 2019-02-11 ENCOUNTER — Inpatient Hospital Stay: Payer: Medicare PPO | Admitting: Internal Medicine

## 2019-02-11 DIAGNOSIS — J9611 Chronic respiratory failure with hypoxia: Secondary | ICD-10-CM

## 2019-02-11 DIAGNOSIS — Z515 Encounter for palliative care: Secondary | ICD-10-CM

## 2019-02-11 DIAGNOSIS — I6340 Cerebral infarction due to embolism of unspecified cerebral artery: Secondary | ICD-10-CM

## 2019-02-11 DIAGNOSIS — C787 Secondary malignant neoplasm of liver and intrahepatic bile duct: Secondary | ICD-10-CM

## 2019-02-11 DIAGNOSIS — I63119 Cerebral infarction due to embolism of unspecified vertebral artery: Secondary | ICD-10-CM

## 2019-02-11 LAB — CBC
HCT: 33.7 % — ABNORMAL LOW (ref 36.0–46.0)
Hemoglobin: 11.1 g/dL — ABNORMAL LOW (ref 12.0–15.0)
MCH: 22.4 pg — ABNORMAL LOW (ref 26.0–34.0)
MCHC: 32.9 g/dL (ref 30.0–36.0)
MCV: 68.1 fL — ABNORMAL LOW (ref 80.0–100.0)
Platelets: 295 10*3/uL (ref 150–400)
RBC: 4.95 MIL/uL (ref 3.87–5.11)
RDW: 17.1 % — ABNORMAL HIGH (ref 11.5–15.5)
WBC: 10.3 10*3/uL (ref 4.0–10.5)
nRBC: 0.2 % (ref 0.0–0.2)

## 2019-02-11 LAB — BASIC METABOLIC PANEL
Anion gap: 13 (ref 5–15)
BUN: 30 mg/dL — ABNORMAL HIGH (ref 8–23)
CO2: 17 mmol/L — ABNORMAL LOW (ref 22–32)
Calcium: 8.8 mg/dL — ABNORMAL LOW (ref 8.9–10.3)
Chloride: 108 mmol/L (ref 98–111)
Creatinine, Ser: 1.44 mg/dL — ABNORMAL HIGH (ref 0.44–1.00)
GFR calc Af Amer: 43 mL/min — ABNORMAL LOW (ref >60)
GFR calc non Af Amer: 37 mL/min — ABNORMAL LOW (ref >60)
Glucose, Bld: 248 mg/dL — ABNORMAL HIGH (ref 70–99)
Potassium: 4.8 mmol/L (ref 3.5–5.1)
Sodium: 138 mmol/L (ref 135–145)

## 2019-02-11 LAB — GLUCOSE, CAPILLARY
Glucose-Capillary: 202 mg/dL — ABNORMAL HIGH (ref 70–99)
Glucose-Capillary: 127 mg/dL — ABNORMAL HIGH (ref 70–99)
Glucose-Capillary: 42 mg/dL — CL (ref 70–99)
Glucose-Capillary: 154 mg/dL — ABNORMAL HIGH (ref 70–99)
Glucose-Capillary: 112 mg/dL — ABNORMAL HIGH (ref 70–99)

## 2019-02-11 MED ORDER — DEXTROSE 50 % IV SOLN
25.0000 g | INTRAVENOUS | Status: AC
  Administered 2019-02-11: 25 g via INTRAVENOUS
  Filled 2019-02-11: qty 50

## 2019-02-11 NOTE — Progress Notes (Addendum)
Physical Therapy Treatment Patient Details Name: CIRCE IWAI MRN: [id number] DOB: [birth year] Today's Date: 02/11/2019    History of Present Illness Pt is a 68 y.o. female presenting to hospital 02/07/19 with AMS and hyperglycemia.  Pt with recent hospitalization 12/14-12/17 with acute on chronic hypoxic respiratory failure.  Pt also s/p mechanical thrombectomy 01/22/19.  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    PT Comments    Pt in bed awake side lying in bed.  Side lying to sit at EOB with mod ax  1 with little to no assist from pt but does not resist.  She sits for approx 20 seconds before initiating return to side lying.  Encouraged continued activity and assisted pt to sit EOB again with same assist.  She sits for about 30 seconds this attempt "LEt me lay down."  Tactile cues given to keep pt sitting but she again returned to side lying with min guard. Pt does not attempt to assist with bed mobility to rolling with cues.  Repositioned with max a x 1.  Pt seems to have the overall strength to attempt transfers but does not stay sitting long enough to attempt.   Follow Up Recommendations  SNF     Equipment Recommendations       Recommendations for Other Services       Precautions / Restrictions Precautions Precautions: Fall Precaution Comments: Seizure precautions Restrictions Weight Bearing Restrictions: No    Mobility  Bed Mobility Overal bed mobility: Needs Assistance Bed Mobility: Supine to Sit;Sit to Supine     Supine to sit: Mod assist Sit to supine: Min guard   General bed mobility comments: sits with mod a x 1 due to no initiation of transition, returns to supine with min gaurd on her own initiation.  Transfers                 General transfer comment: does not stay sitting long  encough to attempt  Ambulation/Gait                 Stairs             Wheelchair Mobility    Modified Rankin (Stroke Patients Only)       Balance Overall balance assessment: Needs assistance Sitting-balance support: No upper extremity supported;Feet supported Sitting balance-Leahy Scale: Poor Sitting balance - Comments: steady for brief periods before she lays back down on her own initation       Standing balance comment: does not stay sitting long enough to attempt                            Cognition Arousal/Alertness: Lethargic Behavior During Therapy: Flat affect Overall Cognitive Status: No family/caregiver present to determine baseline cognitive functioning                                        Exercises Other Exercises Other Exercises: sits x 2 on EOB briefly    General Comments        Pertinent Vitals/Pain Pain Assessment: No/denies pain    Home Living  Prior Function            PT Goals (current goals can now be found in the care plan section) Progress towards PT goals: Progressing toward goals    Frequency    7X/week      PT Plan Current plan remains appropriate    Co-evaluation              AM-PAC PT "6 Clicks" Mobility   Outcome Measure  Help needed turning from your back to your side while in a flat bed without using bedrails?: A Lot Help needed moving from lying on your back to sitting on the side of a flat bed without using bedrails?: A Lot Help needed moving to and from a bed to a chair (including a wheelchair)?: A Lot Help needed standing up from a chair using your arms (e.g., wheelchair or bedside chair)?: A Lot Help needed to walk in hospital room?: Total Help needed climbing 3-5 steps with a railing? : Total 6 Click Score: 10    End of Session Equipment Utilized During Treatment: Oxygen Activity Tolerance: Other (comment) Patient left: in  bed;with call bell/phone within reach;with bed alarm set Nurse Communication: Mobility status       Time: [id number] PT Time Calculation (min) (ACUTE ONLY): 8 min  Charges:  $Therapeutic Activity: 8-22 mins                    Chesley Noon, PTA 02/11/19, 9:05 AM

## 2019-02-11 NOTE — Progress Notes (Signed)
OT Cancellation Note  Patient Details Name: Audrey Chan MRN: [id number] DOB: October 27, 1950   Cancelled Treatment:    Reason Eval/Treat Not Completed: Fatigue/lethargy limiting ability to participate;Patient's level of consciousness. OT attempted evaluation this am. Pt in bed in fetal position. Does not respond to this author's voice upon entering room. Pt opens her eyes with verbal and tactile cues, but otherwise, does not respond to therapist. Pt unable to participate in OT evaluation at this time. Will re-attempt at a later time/date as available and pt medically appropriate for OT.   Shara Blazing, M.S., OTR/L Ascom: [phone] 02/11/19, 9:12 AM

## 2019-02-11 NOTE — Consult Note (Signed)
[facility]  [phone] [phone]   Name: Audrey Chan Date: 02/11/2019 MRN: [id number]  DOB: 01/29/1951  Patient Care Team: Theotis Burrow, MD as PCP - General (Family Medicine) Flora Lipps, MD (Pulmonary Disease) [name], MD as Consulting Physician (Oncology)    REASON FOR CONSULTATION: Audrey Chan is a 68 y.o. female with multiple medical problems including COPD, chronic respiratory failure on home O2 at 2 L, history of CVA without residual deficits, CKD 3, who was recently hospitalized 01/22/2019-01/25/2019 for acute DVT/PE and underwent thrombectomy.  She was incidentally found to have hepatic metastases and underwent outpatient biopsy on 01/27/2019.  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  She was readmitted on 02/02/2019-02/06/2019 with acute on chronic hypoxic respiratory failure with suspected cor pulmonale due to recent PE.  Her hospitalization was complicated by confusion.  Patient was seen in consultation by medical oncology.  Family ultimately wanted to pursue systemic chemotherapy.  Patient was started on low-dose carbotaxol while in the hospital.  She was discharged home with home health.  Patient is now readmitted 02/07/2019 with altered mental status and hyperglycemia.  MRI of the brain on 12/18 revealed numerous areas of acute or subacute infarct in bilateral cerebral/cerebellar hemispheres with extensive chronic ischemic change, a chronic infarct in the left cerebellum, and scattered small areas of hemorrhage.  Patient has had persistently poor mentation.  Palliative care was consulted to help address goals. SOCIAL HISTORY:     reports that she quit smoking about 19 years ago. She has never used smokeless tobacco. She reports that she does not drink alcohol or use drugs.   Patient is widowed.  She had a son who is now deceased.  She has a  sister who is involved.  Patient lives at home with her sister.  ADVANCE DIRECTIVES:  Unknown  CODE STATUS: Full code  PAST MEDICAL HISTORY: Past Medical History:  Diagnosis Date  . Allergic rhinitis   . Aneurysm of unspecified site ([place])   . Cholelithiasis   . Diabetes type 2, controlled ([place])   . GERD (gastroesophageal reflux disease)   . Hypertension   . Leg DVT (deep venous thromboembolism), acute, bilateral ([place]) 01/21/2019  . Multiple subsegmental pulmonary emboli without acute cor pulmonale ([place]) 01/20/2019  . Neuropathy   . Veno-occlusive disease     PAST SURGICAL HISTORY:  Past Surgical History:  Procedure Laterality Date  . ABDOMINAL HYSTERECTOMY  1972  . CERVICAL FUSION    . CHOLECYSTECTOMY N/A 12/28/2014   Procedure: LAPAROSCOPIC CHOLECYSTECTOMY;  Surgeon: Sherri Rad, MD;  Location: [facility] ORS;  Service: General;  Laterality: N/A;  . EYE SURGERY     detached retina  . MASS EXCISION Left 05/06/2018   Procedure: EXCISION OF LEFT ANTECUBITAL CYST, DIABETIC;  Surgeon: Vickie Epley, MD;  Location: [facility] ORS;  Service: Vascular;  Laterality: Left;  . PULMONARY THROMBECTOMY Bilateral 01/22/2019   Procedure: PULMONARY THROMBECTOMY;  Surgeon: Algernon Huxley, MD;  Location: [facility];  Service: Cardiovascular;  Laterality: Bilateral;    HEMATOLOGY/ONCOLOGY HISTORY:  Oncology History Overview Note  # DEC 2020- CT scan- MULTIPLE LIVER LESIONS s/p Bx- HIGH GRADE ADENO CA [not melanoma/or vascular tumor]; CEA- 396; CA 15-3-56/CA 27-29-150;CA-19-9-N;   # Bil PE/right heart strain [s/p thrombectomy; Dr.Dew/Dr.Aleskerov; no lysis- sec to recent stroke]; on eliquis  # COPD/  # NGS/MOLECULAR TESTS:p  # PALLIATIVE CARE EVALUATION:p  # PAIN MANAGEMENT: p   DIAGNOSIS:  STAGE:         ;  GOALS:  CURRENT/MOST RECENT THERAPY :     Liver metastases ([place])   Initial Diagnosis   Liver metastases ([place])   02/05/2019 -  Chemotherapy   The patient had palonosetron  (ALOXI) injection 0.25 mg, 0.25 mg, Intravenous,  Once, 1 of 3 cycles CARBOplatin (PARAPLATIN) 120 mg in sodium chloride 0.9 % 100 mL chemo infusion, 120 mg (100 % of original dose 117.2 mg), Intravenous,  Once, 1 of 3 cycles Dose modification:   (original dose 117.2 mg, Cycle 1) PACLitaxel (TAXOL) 132 mg in sodium chloride 0.9 % 250 mL chemo infusion (</= 80mg /m2), 80 mg/m2 = 132 mg, Intravenous,  Once, 1 of 3 cycles  for chemotherapy treatment.    Carcinoma of unknown primary ([place])  02/04/2019 Initial Diagnosis   Carcinoma of unknown primary ([place])   02/05/2019 -  Chemotherapy   The patient had palonosetron (ALOXI) injection 0.25 mg, 0.25 mg, Intravenous,  Once, 1 of 3 cycles CARBOplatin (PARAPLATIN) 120 mg in sodium chloride 0.9 % 100 mL chemo infusion, 120 mg (100 % of original dose 117.2 mg), Intravenous,  Once, 1 of 3 cycles Dose modification:   (original dose 117.2 mg, Cycle 1) PACLitaxel (TAXOL) 132 mg in sodium chloride 0.9 % 250 mL chemo infusion (</= 80mg /m2), 80 mg/m2 = 132 mg, Intravenous,  Once, 1 of 3 cycles  for chemotherapy treatment.      ALLERGIES:  has No Known Allergies.  MEDICATIONS:  Current Facility-Administered Medications  Medication Dose Route Frequency Provider Last Rate Last Admin  . 0.9 %  sodium chloride infusion   Intravenous Continuous Ezekiel Slocumb, DO 75 mL/hr at 02/11/19 0400 Rate Verify at 02/11/19 0400  . acetaminophen (TYLENOL) tablet 650 mg  650 mg Oral Q6H PRN Mansy, Jan A, MD       Or  . acetaminophen (TYLENOL) suppository 650 mg  650 mg Rectal Q6H PRN Mansy, Jan A, MD      . albuterol (PROVENTIL) (2.5 MG/3ML) 0.083% nebulizer solution 2.5 mg  2.5 mg Inhalation Q6H PRN Mansy, Jan A, MD      . apixaban (ELIQUIS) tablet 5 mg  5 mg Oral BID Mansy, Jan A, MD   5 mg at 02/11/19 0827  . aspirin EC tablet 81 mg  81 mg Oral Daily Mansy, Jan A, MD   81 mg at 02/11/19 0830  . cholecalciferol (VITAMIN D3) tablet 1,000 Units  1,000 Units Oral Daily  Mansy, Arvella Merles, MD   1,000 Units at 02/11/19 [id number]  . ferrous sulfate tablet 325 mg  325 mg Oral BID WC Mansy, Jan A, MD   325 mg at 02/11/19 [id number]  . fluticasone (FLONASE) 50 MCG/ACT nasal spray 2 spray  2 spray Each Nare Daily PRN Mansy, Jan A, MD      . gabapentin (NEURONTIN) capsule 400 mg  400 mg Oral TID Mansy, Jan A, MD   400 mg at 02/11/19 [id number]  . insulin aspart (novoLOG) injection 0-9 Units  0-9 Units Subcutaneous TID WC Mansy, Arvella Merles, MD   1 Units at 02/11/19 1215  . insulin aspart (novoLOG) injection 3 Units  3 Units Subcutaneous TID WC Nicole Kindred A, DO   3 Units at 02/11/19 1214  . insulin glargine (LANTUS) injection 15 Units  15 Units Subcutaneous QHS Nicole Kindred A, DO   15 Units at 02/11/19 0040  . ipratropium-albuterol (DUONEB) 0.5-2.5 (3) MG/3ML nebulizer solution 3 mL  3 mL Nebulization Q6H PRN  Mansy, Jan A, MD      . lip balm (BLISTEX) ointment   Topical PRN Ezekiel Slocumb, DO      . loratadine (CLARITIN) tablet 10 mg  10 mg Oral Daily Mansy, Jan A, MD   10 mg at 02/11/19 [id number]  . LORazepam (ATIVAN) injection 1 mg  1 mg Intravenous Q1H PRN Mansy, Jan A, MD      . magnesium hydroxide (MILK OF MAGNESIA) suspension 30 mL  30 mL Oral Daily PRN Mansy, Jan A, MD      . metoprolol tartrate (LOPRESSOR) tablet 12.5 mg  12.5 mg Oral BID Mansy, Jan A, MD   12.5 mg at 02/11/19 [id number]  . multivitamin with minerals tablet 1 tablet  1 tablet Oral Daily Mansy, Jan A, MD   1 tablet at 02/11/19 0827  . polyethylene glycol (MIRALAX / GLYCOLAX) packet 17 g  17 g Oral Daily PRN Mansy, Jan A, MD      . rosuvastatin (CRESTOR) tablet 20 mg  20 mg Oral Daily Mansy, Jan A, MD   20 mg at 02/11/19 0828    VITAL SIGNS: BP (!) 146/77 (BP Location: Right Arm)   Pulse 97   Temp 98.2 F (36.8 C) (Oral)   Resp 17   Ht 5\' 6"  (1.676 m)   Wt 132 lb 15 oz (60.3 kg)   SpO2 93%   BMI 21.46 kg/m  Filed Weights   02/07/19 0313  Weight: 132 lb 15 oz (60.3 kg)    Estimated body mass index is 21.46 kg/m  as calculated from the following:   Height as of this encounter: 5\' 6"  (1.676 m).   Weight as of this encounter: 132 lb 15 oz (60.3 kg).  LABS: CBC:    Component Value Date/Time   WBC 10.3 02/11/2019 0334   HGB 11.1 (L) 02/11/2019 0334   HCT 33.7 (L) 02/11/2019 0334   PLT 295 02/11/2019 0334   MCV 68.1 (L) 02/11/2019 0334   NEUTROABS 12.1 (H) 02/07/2019 0414   LYMPHSABS 0.6 (L) 02/07/2019 0414   MONOABS 0.6 02/07/2019 0414   EOSABS 0.0 02/07/2019 0414   BASOSABS 0.0 02/07/2019 0414   Comprehensive Metabolic Panel:    Component Value Date/Time   NA 138 02/11/2019 0334   K 4.8 02/11/2019 0334   CL 108 02/11/2019 0334   CO2 17 (L) 02/11/2019 0334   BUN 30 (H) 02/11/2019 0334   CREATININE 1.44 (H) 02/11/2019 0334   GLUCOSE 248 (H) 02/11/2019 0334   CALCIUM 8.8 (L) 02/11/2019 0334   AST 130 (H) 02/02/2019 1929   ALT 92 (H) 02/02/2019 1929   ALKPHOS 1,819 (H) 02/02/2019 1929   BILITOT 1.0 02/02/2019 1929   PROT 7.4 02/02/2019 1929   ALBUMIN 3.2 (L) 02/02/2019 1929    RADIOGRAPHIC STUDIES: EEG  Result Date: 02/07/2019 Alexis Goodell, MD     02/07/2019  3:58 PM ELECTROENCEPHALOGRAM REPORT Patient: Audrey Chan       Room #: [id number] EEG No. ID: [id number] Age: 68 y.o.        Sex: female Referring Physician: Mansy Report Date:  02/07/2019       Interpreting Physician: Alexis Goodell History: BERNELDA BYNOG is an 68 y.o. female with altered mental status Medications: Eliquis, ASA, Neurontin, Insulin, Claritin. Lopressor, Crestor Conditions of Recording:  This is a 21 channel routine scalp EEG performed with bipolar and monopolar montages arranged in accordance to the international 10/20 system of electrode placement. One channel was dedicated to EKG  recording. The patient is in the awake and drowsy states. Description:  The background activity is slow and poorly organized.  It is dominated by a low voltage theta activity that is diffusely distributed. Some intermittent underlying  delta activity is noted at times as well.  No epileptiform activity is noted.  Hyperventilation was not performed.  Intermittent photic stimulation was performed but failed to illicit any change in the tracing. IMPRESSION: This is an abnormal EEG secondary to general background slowing.  This finding may be seen with a diffuse disturbance that is etiologically nonspecific, but may include a metabolic encephalopathy, among other possibilities.  No epileptiform activity was noted.  Alexis Goodell, MD Neurology [phone] 02/07/2019, 3:52 PM   CT ABDOMEN PELVIS WO CONTRAST  Result Date: 02/02/2019 CLINICAL DATA:  Abdominal distension. Recent diagnosis of pulmonary embolus. Multiple liver lesions post biopsy. EXAM: CT ABDOMEN AND PELVIS WITHOUT CONTRAST TECHNIQUE: Multidetector CT imaging of the abdomen and pelvis was performed following the standard protocol without IV contrast. COMPARISON:  Chest CTA earlier this day. Abdominal CT 2 weeks ago 01/20/2019 FINDINGS: Lower chest: Emphysema scattered subpleural nodules, assessed on chest CT earlier this day. Hepatobiliary: Heterogeneous liver parenchyma. Multiple liver lesions are better defined on prior contrast-enhanced exam. Difficult to assess for change given differences in technique. Postcholecystectomy similar proximal biliary prominence to prior. Motion artifact limits detailed assessment. Pancreas: Motion obscured. Coarse parenchymal calcification in the pancreatic tail. Ductal dilatation in the tail measures up to 8 mm. Heterogeneous low-density in the pancreatic tail, series 2, image 16. Detailed parenchymal assessment is limited in the lack of contrast and motion. Spleen: Normal in size without focal abnormality. Adrenals/Urinary Tract: Bilateral adrenal thickening. No hydronephrosis. There is excreted IV contrast in both renal collecting systems from prior chest CTA. Excreted IV contrast in the urinary bladder. No bladder wall thickening.  Stomach/Bowel: Bowel evaluation is limited in the absence of contrast and patient motion. Also paucity of intra-abdominal fat. Stomach is nondistended. No small bowel obstruction or obvious inflammation. Normal appendix. Moderate stool burden throughout the colon. No colonic wall thickening or inflammation. Vascular/Lymphatic: Moderate aortic atherosclerosis. No aneurysm. Limited assessment for adenopathy in the absence of contrast and patient motion. Reproductive: Post hysterectomy per history. No evidence of adnexal mass. Other: Small amount of perihepatic free fluid which has slightly increased in size from prior exam. Fluid is slightly dense and likely represents combination of simple ascites and post biopsy hemorrhage. Small amount of blood dependently in the pelvis. Cannot assess for active extravasation in the absence of IV contrast, however volume of fluid would not suggest this. Musculoskeletal: Postsurgical change of the anterior abdominal wall. A focal bone abnormality. IMPRESSION: 1. Post recent liver biopsy. Small amount of high-density fluid adjacent to the liver and tracking in the pelvis likely post biopsy hemorrhage. Cannot assess for active extravasation in the absence of IV contrast, however volume of fluid does not suggest this. Multiple liver lesions are better characterized on prior contrast-enhanced exam. 2. Coarse parenchymal calcification in the pancreatic tail with heterogeneous low-density and distal pancreatic ductal dilatation. In the setting of multiple hepatic lesions, findings are concerning for pancreatic neoplasm, however not well-defined on this noncontrast exam. Aortic Atherosclerosis (ICD10-I70.0). Electronically Signed   By: Keith Rake M.D.   On: 02/02/2019 23:20   CT Head Wo Contrast  Result Date: 02/07/2019 CLINICAL DATA:  Altered mental status. Recent diagnosis of liver metastasis. EXAM: CT HEAD WITHOUT CONTRAST TECHNIQUE: Contiguous axial images were obtained from  the base of the  skull through the vertex without intravenous contrast. COMPARISON:  Head CT 5 days ago 02/02/2019. Brain MRI 01/01/2019 FINDINGS: Brain: No intracranial hemorrhage, mass effect, or midline shift. Stable brain volume. No hydrocephalus. The basilar cisterns are patent. Unchanged chronic small vessel ischemia. Remote left cerebellar infarct. Small additional cerebellar infarct on MRI last month is not well seen by CT. No evidence of territorial infarct or acute ischemia. No extra-axial or intracranial fluid collection. Vascular: No hyperdense vessel. Skull: No fracture or focal lesion. Sinuses/Orbits: Paranasal sinuses and mastoid air cells are clear. The visualized orbits are unremarkable. Other: None. IMPRESSION: 1. No acute intracranial abnormality. 2. Unchanged atrophy and chronic ischemia. Electronically Signed   By: Keith Rake M.D.   On: 02/07/2019 03:54   CT Head Wo Contrast  Result Date: 02/02/2019 CLINICAL DATA:  Altered mental status EXAM: CT HEAD WITHOUT CONTRAST TECHNIQUE: Contiguous axial images were obtained from the base of the skull through the vertex without intravenous contrast. COMPARISON:  12/31/2018 FINDINGS: Brain: Chronic atrophic and ischemic changes are again seen and stable. No acute hemorrhage, acute infarction or space-occupying mass lesion are seen. Vascular: No hyperdense vessel or unexpected calcification. Skull: Normal. Negative for fracture or focal lesion. Sinuses/Orbits: No acute finding. Other: None. IMPRESSION: Chronic atrophic and ischemic changes similar to that seen on the prior exam. No acute abnormality is noted. Electronically Signed   By: Inez Catalina M.D.   On: 02/02/2019 20:31   CT Angio Chest PE W/Cm &/Or Wo Cm  Result Date: 02/02/2019 CLINICAL DATA:  Shortness of breath EXAM: CT ANGIOGRAPHY CHEST WITH CONTRAST TECHNIQUE: Multidetector CT imaging of the chest was performed using the standard protocol during bolus administration of  intravenous contrast. Multiplanar CT image reconstructions and MIPs were obtained to evaluate the vascular anatomy. CONTRAST:  62mL OMNIPAQUE IOHEXOL 350 MG/ML SOLN COMPARISON:  01/20/2019 FINDINGS: Cardiovascular: Thoracic aorta demonstrates atherosclerotic calcifications without aneurysmal dilatation or dissection. The heart is at the upper limits of normal in size. The pulmonary artery is well visualized with a normal branching pattern. The previously seen pulmonary emboli are again identified bilaterally with some minimal improvement in the small central branches. No significant resolution of the more peripheral emboli is noted. No new significant emboli are noted. Changes of right heart strain are again noted and stable. Mediastinum/Nodes: The esophagus as visualized is within normal limits. No hilar or mediastinal adenopathy is seen. The thoracic inlet is within normal limits. Lungs/Pleura: Diffuse emphysematous changes are noted throughout both lungs. Scattered subpleural nodular changes are seen without significant increase from the prior study. No focal confluent infiltrate is seen. No sizable effusion is noted. No pneumothorax seen. Upper Abdomen: Visualized upper abdomen demonstrates mild perihepatic fluid stable from the prior exam. Stable hypodense lesions are noted throughout the liver consistent with the given clinical history of hepatic metastatic disease. Musculoskeletal: Degenerative changes of the thoracic spine are noted. No acute bony abnormality is seen. Review of the MIP images confirms the above findings. IMPRESSION: Slight improvement in pulmonary emboli bilaterally although the peripheral emboli appear stable. No new emboli are seen. Persistent right heart strain. Emphysematous changes without acute infiltrate. Multiple pulmonary nodules stable from the prior exam which may represent some metastatic disease Hepatic metastatic disease. Aortic Atherosclerosis (ICD10-I70.0) and Emphysema  (ICD10-J43.9). Electronically Signed   By: Inez Catalina M.D.   On: 02/02/2019 20:39   CT Angio Chest PE W/Cm &/Or Wo Cm  Result Date: 01/20/2019 CLINICAL DATA:  Suspected covid, elevated LFTs, recent stroke EXAM: CT ANGIOGRAPHY  CHEST, abdomen, and pelvis WITH CONTRAST TECHNIQUE: Multidetector CT imaging of the chest, abdomen, and pelvis was performed using the standard protocol during bolus administration of intravenous contrast. Multiplanar CT image reconstructions and MIPs were obtained to evaluate the vascular anatomy. CONTRAST:  79mL OMNIPAQUE IOHEXOL 350 MG/ML SOLN COMPARISON:  CT chest September 13, 2017 FINDINGS: Cardiovascular: There is a optimal opacification of the pulmonary arteries. There is a small nonocclusive thrombus seen within the left main pulmonary artery which extends into the anterior left upper lobe segmental branches and partially occlusive thrombus in the posterior left lower lobe subsegmental branches. There is also partially occlusive thrombus seen within the posterior right upper lobe segmental branch, series 6, image 37. There is also partially occlusive thrombus seen extending into the right middle lobe subsegmental branches and posterior right lower lobe subsegmental branches. The heart is normal in size. No pericardial effusion or thickening. There is slight straightening of the interventricular septum which could be due to early right ventricular heart strain. There is normal three-vessel brachiocephalic anatomy without proximal stenosis. Scattered atherosclerosis is noted. The thoracic aorta is normal in appearance. Mediastinum/Nodes: No hilar, mediastinal, or axillary adenopathy. Thyroid gland, trachea, and esophagus demonstrate no significant findings. Lungs/Pleura: Small subcentimeter pulmonary nodule are again identified as on prior exam dating back to 2019, for example in the posterior right upper lobe there is a 5 mm pulmonary nodule, series 8, image 21 and anterior right  lower lobe, series 8, image 54 a 4 mm pulmonary nodule. There is however a new small nodular opacity in the posterior left upper lobe, series 8, image 47 measuring 7 mm not seen on prior exam. No large airspace consolidation or pleural effusion is seen. Centrilobular emphysematous changes seen within both lung apices. Musculoskeletal: No chest wall abnormality. No acute or significant osseous findings. Review of the MIP images confirms the above findings. Abdomen/pelvis: Hepatobiliary: There are multiple low-density liver lesions seen throughout which were not seen on the prior exam. There is also heterogeneous enhancement seen throughout the liver parenchyma. Central periportal edema is noted. The portal vein is patent. The patient is status post cholecystectomy. A small amount of perihepatic ascites is noted. Pancreas: Unremarkable. No pancreatic ductal dilatation or surrounding inflammatory changes. Spleen: Normal in size without focal abnormality. Adrenals/Urinary Tract: Both adrenal glands appear normal. The kidneys and collecting system appear normal without evidence of urinary tract calculus or hydronephrosis. Bladder is unremarkable. Stomach/Bowel: The stomach, small bowel, and colon are normal in appearance. No inflammatory changes, wall thickening, or obstructive findings. Vascular/Lymphatic: There are no enlarged mesenteric, retroperitoneal, or pelvic lymph nodes. Scattered aortic atherosclerotic calcifications are seen without aneurysmal dilatation. Reproductive: The uterus and adnexa are unremarkable. Other: No evidence of abdominal wall mass or hernia. Musculoskeletal: No acute or significant osseous findings. IMPRESSION: 1. Bilateral partially occlusive segmental and subsegmental pulmonary emboli as described above. 2. Findings which could be suggestive of early right ventricular heart strain. 3. Multiple unchanged subcentimeter pulmonary nodules as on prior exam dating back to 2019. 4. New nodular  opacity within the posterior left upper lobe measuring 7 mm which is nonspecific, and would recommend attention on short interval follow-up exam. 5. Centrilobular emphysematous changes. 6. Numerous hepatic lesions throughout, likely suggestive of hepatic metastatic disease. 7. Small amount of perihepatic ascites 8. These results were called by telephone at the time of interpretation on 01/20/2019 at 2:07 am to provider [facility] , who verbally acknowledged these results. Electronically Signed   By: Ebony Cargo.D.  On: 01/20/2019 02:09   MR BRAIN WO CONTRAST  Result Date: 02/07/2019 CLINICAL DATA:  TIA.  Renal failure and stage IV liver cancer. EXAM: MRI HEAD WITHOUT CONTRAST TECHNIQUE: Multiplanar, multiecho pulse sequences of the brain and surrounding structures were obtained without intravenous contrast. COMPARISON:  MRI head 01/01/2019.  CT head 02/07/2019 FINDINGS: Brain: Multiple small areas of acute infarct throughout both cerebellar hemispheres. Small areas of acute/subacute infarct in both cerebral hemispheres involving the frontal and parietal lobes. Extensive chronic microvascular ischemic change throughout the cerebral white matter bilaterally. Chronic infarcts in the cerebellum on the left. Small areas of hemorrhage in the right temporal and parietal lobe, not seen previously. Methemoglobin also noted in the parietal lobe bilaterally due to prior hemorrhage. Methemoglobin also noted in the left cerebellum at site of prior infarct. Ventricle size normal.  No mass lesion or midline shift Vascular: Normal arterial flow voids. Skull and upper cervical spine: Negative. Cervical fusion hardware noted. Sinuses/Orbits: Negative Other: None IMPRESSION: Numerous small areas of acute or subacute infarct in both cerebral hemispheres and both cerebellar hemispheres. Findings are suggestive of emboli. Extensive chronic ischemic change in the white matter bilaterally. Chronic infarct left cerebellum.  Scattered small areas of hemorrhage in the brain which appear to be subacute to chronic with evidence of methemoglobin several areas of hemorrhage. Electronically Signed   By: Franchot Gallo M.D.   On: 02/07/2019 12:08   CT abd/pelvis w/ IV contrast  Result Date: 01/20/2019 CLINICAL DATA:  Suspected covid, elevated LFTs, recent stroke EXAM: CT ANGIOGRAPHY CHEST, abdomen, and pelvis WITH CONTRAST TECHNIQUE: Multidetector CT imaging of the chest, abdomen, and pelvis was performed using the standard protocol during bolus administration of intravenous contrast. Multiplanar CT image reconstructions and MIPs were obtained to evaluate the vascular anatomy. CONTRAST:  34mL OMNIPAQUE IOHEXOL 350 MG/ML SOLN COMPARISON:  CT chest September 13, 2017 FINDINGS: Cardiovascular: There is a optimal opacification of the pulmonary arteries. There is a small nonocclusive thrombus seen within the left main pulmonary artery which extends into the anterior left upper lobe segmental branches and partially occlusive thrombus in the posterior left lower lobe subsegmental branches. There is also partially occlusive thrombus seen within the posterior right upper lobe segmental branch, series 6, image 37. There is also partially occlusive thrombus seen extending into the right middle lobe subsegmental branches and posterior right lower lobe subsegmental branches. The heart is normal in size. No pericardial effusion or thickening. There is slight straightening of the interventricular septum which could be due to early right ventricular heart strain. There is normal three-vessel brachiocephalic anatomy without proximal stenosis. Scattered atherosclerosis is noted. The thoracic aorta is normal in appearance. Mediastinum/Nodes: No hilar, mediastinal, or axillary adenopathy. Thyroid gland, trachea, and esophagus demonstrate no significant findings. Lungs/Pleura: Small subcentimeter pulmonary nodule are again identified as on prior exam dating back  to 2019, for example in the posterior right upper lobe there is a 5 mm pulmonary nodule, series 8, image 21 and anterior right lower lobe, series 8, image 54 a 4 mm pulmonary nodule. There is however a new small nodular opacity in the posterior left upper lobe, series 8, image 47 measuring 7 mm not seen on prior exam. No large airspace consolidation or pleural effusion is seen. Centrilobular emphysematous changes seen within both lung apices. Musculoskeletal: No chest wall abnormality. No acute or significant osseous findings. Review of the MIP images confirms the above findings. Abdomen/pelvis: Hepatobiliary: There are multiple low-density liver lesions seen throughout which were not seen on  the prior exam. There is also heterogeneous enhancement seen throughout the liver parenchyma. Central periportal edema is noted. The portal vein is patent. The patient is status post cholecystectomy. A small amount of perihepatic ascites is noted. Pancreas: Unremarkable. No pancreatic ductal dilatation or surrounding inflammatory changes. Spleen: Normal in size without focal abnormality. Adrenals/Urinary Tract: Both adrenal glands appear normal. The kidneys and collecting system appear normal without evidence of urinary tract calculus or hydronephrosis. Bladder is unremarkable. Stomach/Bowel: The stomach, small bowel, and colon are normal in appearance. No inflammatory changes, wall thickening, or obstructive findings. Vascular/Lymphatic: There are no enlarged mesenteric, retroperitoneal, or pelvic lymph nodes. Scattered aortic atherosclerotic calcifications are seen without aneurysmal dilatation. Reproductive: The uterus and adnexa are unremarkable. Other: No evidence of abdominal wall mass or hernia. Musculoskeletal: No acute or significant osseous findings. IMPRESSION: 1. Bilateral partially occlusive segmental and subsegmental pulmonary emboli as described above. 2. Findings which could be suggestive of early right  ventricular heart strain. 3. Multiple unchanged subcentimeter pulmonary nodules as on prior exam dating back to 2019. 4. New nodular opacity within the posterior left upper lobe measuring 7 mm which is nonspecific, and would recommend attention on short interval follow-up exam. 5. Centrilobular emphysematous changes. 6. Numerous hepatic lesions throughout, likely suggestive of hepatic metastatic disease. 7. Small amount of perihepatic ascites 8. These results were called by telephone at the time of interpretation on 01/20/2019 at 2:07 am to provider [facility] , who verbally acknowledged these results. Electronically Signed   By: Prudencio Pair M.D.   On: 01/20/2019 02:09   US Carotid Bilateral  Result Date: 02/07/2019 CLINICAL DATA:  68 year old female with a history of TIA EXAM: BILATERAL CAROTID DUPLEX ULTRASOUND TECHNIQUE: Pearline Cables scale imaging, color Doppler and duplex ultrasound were performed of bilateral carotid and vertebral arteries in the neck. COMPARISON:  None. FINDINGS: Criteria: Quantification of carotid stenosis is based on velocity parameters that correlate the residual internal carotid diameter with NASCET-based stenosis levels, using the diameter of the distal internal carotid lumen as the denominator for stenosis measurement. The following velocity measurements were obtained: RIGHT ICA:  Systolic 94 cm/sec, Diastolic 28 cm/sec CCA:  43 cm/sec SYSTOLIC ICA/CCA RATIO:  2.2 ECA:  43 cm/sec LEFT ICA:  Systolic 88 cm/sec, Diastolic 29 cm/sec CCA:  42 cm/sec SYSTOLIC ICA/CCA RATIO:  2.1 ECA:  34 cm/sec Right Brachial SBP: Not acquired Left Brachial SBP: Not acquired RIGHT CAROTID ARTERY: No significant calcified disease of the right common carotid artery. Intermediate waveform maintained. Heterogeneous plaque without significant calcifications at the right carotid bifurcation. Low resistance waveform of the right ICA. No significant tortuosity. RIGHT VERTEBRAL ARTERY: Antegrade flow with low  resistance waveform. LEFT CAROTID ARTERY: No significant calcified disease of the left common carotid artery. Intermediate waveform maintained. Heterogeneous plaque at the left carotid bifurcation without significant calcifications. Low resistance waveform of the left ICA. LEFT VERTEBRAL ARTERY:  Antegrade flow with low resistance waveform. Additional: Incidental bilateral thyroid nodules. IMPRESSION: Mild heterogeneous plaque at the bilateral carotid bifurcation, with discordant results regarding degree of stenosis by established duplex criteria. Peak velocity suggests no significant stenosis, with the ICA/ CCA ratio suggesting a greater degree of stenosis, bilaterally. If establishing a more accurate degree of stenosis is required, cerebral angiogram should be considered, or as a second best test, CTA. Bilateral thyroid nodules. Referral for a dedicated thyroid ultrasound may be considered. Signed, Dulcy Fanny. Dellia Nims, [facility] Electronically Signed   By: Corrie Mckusick D.O.  On: 02/07/2019 12:18   PERIPHERAL VASCULAR CATHETERIZATION  Result Date: 01/22/2019 See op note  US Venous Img Lower Bilateral (DVT)  Result Date: 01/20/2019 CLINICAL DATA:  History of pulmonary embolism, now with lower extremity pain. Evaluate for DVT. EXAM: BILATERAL LOWER EXTREMITY VENOUS DOPPLER ULTRASOUND TECHNIQUE: Gray-scale sonography with graded compression, as well as color Doppler and duplex ultrasound were performed to evaluate the lower extremity deep venous systems from the level of the common femoral vein and including the common femoral, femoral, profunda femoral, popliteal and calf veins including the posterior tibial, peroneal and gastrocnemius veins when visible. The superficial great saphenous vein was also interrogated. Spectral Doppler was utilized to evaluate flow at rest and with distal augmentation maneuvers in the common femoral, femoral and  popliteal veins. COMPARISON:  Chest CTA-01/20/2019 FINDINGS: RIGHT LOWER EXTREMITY Common Femoral Vein: No evidence of thrombus. Normal compressibility, respiratory phasicity and response to augmentation. Saphenofemoral Junction: No evidence of thrombus. Normal compressibility and flow on color Doppler imaging. Profunda Femoral Vein: No evidence of thrombus. Normal compressibility and flow on color Doppler imaging. Femoral Vein: No evidence of thrombus. Normal compressibility, respiratory phasicity and response to augmentation. Popliteal Vein: No evidence of thrombus. Normal compressibility, respiratory phasicity and response to augmentation. Calf Veins: There is hypoechoic occlusive thrombus within both paired right peroneal veins (images 58 and 59). Both paired right posterior tibial veins appear patent where imaged. Superficial Great Saphenous Vein: No evidence of thrombus. Normal compressibility. Other Findings:  None. _________________________________________________________ LEFT LOWER EXTREMITY Common Femoral Vein: No evidence of thrombus. Normal compressibility, respiratory phasicity and response to augmentation. Saphenofemoral Junction: No evidence of thrombus. Normal compressibility and flow on color Doppler imaging. Profunda Femoral Vein: No evidence of thrombus. Normal compressibility and flow on color Doppler imaging. Femoral Vein: No evidence of thrombus. Normal compressibility, respiratory phasicity and response to augmentation. Popliteal Vein: No evidence of thrombus. Normal compressibility, respiratory phasicity and response to augmentation. Calf Veins: There is hypoechoic occlusive thrombus within 1 of the paired left peroneal veins (images 29 and 30). The adjacent paired left peroneal vein as well though both paired posterior tibial veins appear patent. Superficial Great Saphenous Vein: No evidence of thrombus. Normal compressibility. Other Findings:  None. IMPRESSION: Examination is positive for  occlusive DVT involving both paired right peroneal veins as well as one of two paired left peroneal veins. There is no extension of this distal tibial DVT to the more proximal venous system of either lower extremity. Electronically Signed   By: [name] Mariscal M.D.   On: 01/20/2019 17:02   CT BIOPSY  Result Date: 01/27/2019 INDICATION: Multiple liver lesions. EXAM: CT directed liver biopsy. MEDICATIONS: None. ANESTHESIA/SEDATION: Moderate (conscious) sedation was employed during this procedure. A total of Versed 2 mg and Fentanyl 25 mcg was administered intravenously. Moderate Sedation Time: 14 minutes. The patient's level of consciousness and vital signs were monitored continuously by radiology nursing throughout the procedure under my direct supervision. FLUOROSCOPY TIME:  Fluoroscopy Time: 0 minutes 0 seconds (0 mGy). COMPLICATIONS: None immediate. PROCEDURE: After discussing the risks and benefits of this procedure the patient informed consent was obtained. Standard time-out procedure was performed. Patient was sterilely prepped and draped. Following local anesthesia with 1% lidocaine and IV conscious sedation CT-directed liver lesion core biopsy was obtained with 2 good 23 mm 18 gauge core biopsy samples obtained. Sample sent to pathology. There no complications. IMPRESSION: Successful liver lesion CT-directed biopsy. Electronically Signed   By: Marcello Moores  Register   On: 01/27/2019 11:47  DG Chest Portable 1 View  Result Date: 02/07/2019 CLINICAL DATA:  Hypoxia. EXAM: PORTABLE CHEST 1 VIEW COMPARISON:  Chest radiograph and CTA 5 days ago 02/02/2019 FINDINGS: Lower lung volumes from prior exam. Emphysema. Small pulmonary nodules on prior CT are not well visualized radiographically. No pulmonary edema, focal airspace disease, pleural effusion or pneumothorax. No acute osseous abnormalities are seen. IMPRESSION: 1. Emphysema without acute abnormality. 2. Small pulmonary nodules on prior CT are not well  visualized radiographically. Electronically Signed   By: Keith Rake M.D.   On: 02/07/2019 03:44   DG Chest Portable 1 View  Result Date: 02/02/2019 CLINICAL DATA:  Shortness of breath, stomach pain EXAM: PORTABLE CHEST 1 VIEW COMPARISON:  01/23/2019 FINDINGS: The heart size and mediastinal contours are within normal limits. Emphysema. The visualized skeletal structures are unremarkable. IMPRESSION: Emphysema without acute abnormality of the lungs in AP portable projection. Electronically Signed   By: Eddie Candle M.D.   On: 02/02/2019 19:06   DG Chest Port 1 View  Result Date: 01/23/2019 CLINICAL DATA:  Hypertension, pulmonary emboli EXAM: PORTABLE CHEST 1 VIEW COMPARISON:  01/20/2019, 01/19/2019 FINDINGS: The heart size and mediastinal contours are stable. No new focal airspace consolidation, pleural effusion, or pneumothorax. Lungs are mildly hyperexpanded. The visualized skeletal structures are unremarkable. IMPRESSION: Stable appearance of the chest without new airspace opacity. Electronically Signed   By: Davina Poke M.D.   On: 01/23/2019 12:54   DG Chest Portable 1 View  Result Date: 01/19/2019 CLINICAL DATA:  Shortness of breath EXAM: PORTABLE CHEST 1 VIEW COMPARISON:  12/31/2018 FINDINGS: Cardiomegaly. Lungs clear. No effusions or edema. No acute bony abnormality. IMPRESSION: Cardiomegaly.  No active disease. Electronically Signed   By: Rolm Baptise M.D.   On: 01/19/2019 23:33   ECHOCARDIOGRAM COMPLETE  Result Date: 02/03/2019   ECHOCARDIOGRAM REPORT   Patient Name:   Audrey Chan Date of Exam: 02/03/2019 Medical Rec #:  [id number]         Height:       66.0 in Accession #:    [id number]        Weight:       132.0 lb Date of Birth:  Jul 03, 1950         BSA:          1.68 m Patient Age:    63 years          BP:           145/86 mmHg Patient Gender: F                 HR:           95 bpm. Exam Location:  [facility] Procedure: 2D Echo, Cardiac Doppler and Color Doppler Indications:      I50.31 CHF-Acute Diastolic  History:         Patient has prior history of Echocardiogram examinations, most                  recent 01/20/2019. Risk Factors:Hypertension and Diabetes.  Sonographer:     Charmayne Sheer RDCS (AE) Referring Phys:  [id number] Athena Masse Diagnosing Phys: Kathlyn Sacramento MD  Sonographer Comments: Suboptimal subcostal window. IMPRESSIONS  1. Left ventricular ejection fraction, by visual estimation, is 55 to 60%. The left ventricle has normal function. There is mildly increased left ventricular hypertrophy.  2. Left ventricular diastolic parameters are consistent with Grade I diastolic dysfunction (impaired relaxation).  3. Right ventricular pressure overload.  4. The left ventricle  has no regional wall motion abnormalities.  5. Global right ventricle has mildly reduced systolic function.The right ventricular size is moderately enlarged. No increase in right ventricular wall thickness.  6. Left atrial size was normal.  7. Right atrial size was moderately dilated.  8. The mitral valve is normal in structure. No evidence of mitral valve regurgitation. No evidence of mitral stenosis.  9. The tricuspid valve is normal in structure. Tricuspid valve regurgitation moderate. 10. The aortic valve is normal in structure. Aortic valve regurgitation is not visualized. Mild to moderate aortic valve sclerosis/calcification without any evidence of aortic stenosis. 11. The pulmonic valve was normal in structure. Pulmonic valve regurgitation is not visualized. 12. Moderately to severely elevated pulmonary artery systolic pressure. 13. The tricuspid regurgitant velocity is 4.02 m/s, and with an assumed right atrial pressure of 5 mmHg, the estimated right ventricular systolic pressure is moderately elevated at 69.6 mmHg. FINDINGS  Left Ventricle: Left ventricular ejection fraction, by visual estimation, is 55 to 60%. The left ventricle has normal function. The left ventricle has no regional wall motion  abnormalities. There is mildly increased left ventricular hypertrophy. The interventricular septum is flattened in systole, consistent with right ventricular pressure overload. Left ventricular diastolic parameters are consistent with Grade I diastolic dysfunction (impaired relaxation). Normal left atrial pressure. Right Ventricle: The right ventricular size is moderately enlarged. No increase in right ventricular wall thickness. Global RV systolic function is has mildly reduced systolic function. The tricuspid regurgitant velocity is 4.02 m/s, and with an assumed right atrial pressure of 5 mmHg, the estimated right ventricular systolic pressure is moderately elevated at 69.6 mmHg. Left Atrium: Left atrial size was normal in size. Right Atrium: Right atrial size was moderately dilated Pericardium: There is no evidence of pericardial effusion. Mitral Valve: The mitral valve is normal in structure. No evidence of mitral valve regurgitation. No evidence of mitral valve stenosis by observation. MV peak gradient, 4.4 mmHg. Tricuspid Valve: The tricuspid valve is normal in structure. Tricuspid valve regurgitation moderate. Aortic Valve: The aortic valve is normal in structure. Aortic valve regurgitation is not visualized. Mild to moderate aortic valve sclerosis/calcification is present, without any evidence of aortic stenosis. Aortic valve mean gradient measures 4.0 mmHg. Aortic valve peak gradient measures 6.9 mmHg. Aortic valve area, by VTI measures 2.09 cm. Pulmonic Valve: The pulmonic valve was normal in structure. Pulmonic valve regurgitation is not visualized. Pulmonic regurgitation is not visualized. Aorta: The aortic root, ascending aorta and aortic arch are all structurally normal, with no evidence of dilitation or obstruction. IAS/Shunts: No atrial level shunt detected by color flow Doppler. There is no evidence of a patent foramen ovale. No ventricular septal defect is seen or detected. There is no evidence of  an atrial septal defect.  LEFT VENTRICLE PLAX 2D LVIDd:         3.70 cm  Diastology LVIDs:         2.41 cm  LV e' lateral:   6.64 cm/s LV PW:         0.78 cm  LV E/e' lateral: 7.4 LV IVS:        0.71 cm LVOT diam:     1.90 cm LV SV:         38 ml LV SV Index:   22.62 LVOT Area:     2.84 cm  RIGHT VENTRICLE RV Basal diam:  2.91 cm LEFT ATRIUM             Index  RIGHT ATRIUM           Index LA diam:        2.80 cm 1.67 cm/m  RA Area:     23.20 cm LA Vol (A2C):   31.5 ml 18.79 ml/m RA Volume:   86.80 ml  51.79 ml/m LA Vol (A4C):   27.0 ml 16.11 ml/m LA Biplane Vol: 29.8 ml 17.78 ml/m  AORTIC VALVE                   PULMONIC VALVE AV Area (Vmax):    1.97 cm    PV Vmax:       0.75 m/s AV Area (Vmean):   1.94 cm    PV Vmean:      54.900 cm/s AV Area (VTI):     2.09 cm    PV VTI:        0.146 m AV Vmax:           131.00 cm/s PV Peak grad:  2.3 mmHg AV Vmean:          86.200 cm/s PV Mean grad:  1.0 mmHg AV VTI:            0.209 m AV Peak Grad:      6.9 mmHg AV Mean Grad:      4.0 mmHg LVOT Vmax:         90.90 cm/s LVOT Vmean:        59.100 cm/s LVOT VTI:          0.154 m LVOT/AV VTI ratio: 0.74  AORTA Ao Root diam: 2.70 cm MITRAL VALVE                        TRICUSPID VALVE MV Area (PHT): 3.91 cm             TR Peak grad:   64.6 mmHg MV Peak grad:  4.4 mmHg             TR Vmax:        421.00 cm/s MV Mean grad:  2.0 mmHg MV Vmax:       1.05 m/s             SHUNTS MV Vmean:      64.9 cm/s            Systemic VTI:  0.15 m MV VTI:        0.14 m               Systemic Diam: 1.90 cm MV PHT:        56.26 msec MV Decel Time: 194 msec MV E velocity: 49.00 cm/s 103 cm/s MV A velocity: 97.00 cm/s 70.3 cm/s MV E/A ratio:  0.51       1.5  Kathlyn Sacramento MD Electronically signed by Kathlyn Sacramento MD Signature Date/Time: 02/03/2019/1:36:07 PM    Final    ECHOCARDIOGRAM COMPLETE  Result Date: 01/20/2019   ECHOCARDIOGRAM REPORT   Patient Name:   Audrey Chan Date of Exam: 01/20/2019 Medical Rec #:  [id number]          Height:       66.0 in Accession #:    [id number]        Weight:       122.0 lb Date of Birth:  [birth year]         BSA:          1.62 m Patient Age:  68 years          BP:           116/71 mmHg Patient Gender: F                 HR:           91 bpm. Exam Location:  [facility] Procedure: 2D Echo, Cardiac Doppler and Color Doppler Indications:     Abnormal ECG 794.31  History:         Patient has no prior history of Echocardiogram examinations.                  Risk Factors:Hypertension and Diabetes.  Sonographer:     Sherrie Sport RDCS (AE) Referring Phys:  [id number] Victoriano Lain A THOMAS Diagnosing Phys: Serafina Royals MD IMPRESSIONS  1. Left ventricular ejection fraction, by visual estimation, is 55 to 60%. The left ventricle has normal function. There is no left ventricular hypertrophy.  2. Global right ventricle has moderately reduced systolic function.The right ventricular size is moderately enlarged. Mildly increased right ventricular wall thickness.  3. Left atrial size was normal.  4. Right atrial size was moderately dilated.  5. The mitral valve is normal in structure. Trace mitral valve regurgitation.  6. The tricuspid valve is normal in structure. Tricuspid valve regurgitation moderate.  7. The aortic valve is normal in structure. Aortic valve regurgitation is not visualized.  8. The pulmonic valve was grossly normal. Pulmonic valve regurgitation is trivial.  9. Moderately elevated pulmonary artery systolic pressure. 10. The tricuspid regurgitant velocity is 3.62 m/s, and with an assumed right atrial pressure of 10 mmHg, the estimated right ventricular systolic pressure is moderately elevated at 62.6 mmHg. FINDINGS  Left Ventricle: Left ventricular ejection fraction, by visual estimation, is 55 to 60%. The left ventricle has normal function. There is no left ventricular hypertrophy. Right Ventricle: The right ventricular size is moderately enlarged. Mildly increased right ventricular wall thickness. Global RV systolic  function is has moderately reduced systolic function. The tricuspid regurgitant velocity is 3.62 m/s, and with an assumed right atrial pressure of 10 mmHg, the estimated right ventricular systolic pressure is moderately elevated at 62.6 mmHg. Left Atrium: Left atrial size was normal in size. Right Atrium: Right atrial size was moderately dilated Pericardium: There is no evidence of pericardial effusion. Mitral Valve: The mitral valve is normal in structure. Trace mitral valve regurgitation. Tricuspid Valve: The tricuspid valve is normal in structure. Tricuspid valve regurgitation moderate. Aortic Valve: The aortic valve is normal in structure. Aortic valve regurgitation is not visualized. Aortic valve mean gradient measures 3.7 mmHg. Aortic valve peak gradient measures 5.2 mmHg. Aortic valve area, by VTI measures 1.65 cm. Pulmonic Valve: The pulmonic valve was grossly normal. Pulmonic valve regurgitation is trivial. Aorta: The aortic root and ascending aorta are structurally normal, with no evidence of dilitation. IAS/Shunts: No atrial level shunt detected by color flow Doppler.  LEFT VENTRICLE PLAX 2D LVIDd:         3.16 cm  Diastology LVIDs:         1.90 cm  LV e' lateral:   5.77 cm/s LV PW:         1.41 cm  LV E/e' lateral: 9.3 LV IVS:        1.11 cm  LV e' medial:    3.92 cm/s LVOT diam:     2.00 cm  LV E/e' medial:  13.6 LV SV:         29 ml  LV SV Index:   17.85 LVOT Area:     3.14 cm  RIGHT VENTRICLE RV Basal diam:  4.44 cm RV S prime:     10.20 cm/s TAPSE (M-mode): 3.0 cm LEFT ATRIUM             Index       RIGHT ATRIUM           Index LA diam:        2.40 cm 1.48 cm/m  RA Area:     25.70 cm LA Vol (A2C):   21.1 ml 13.02 ml/m RA Volume:   96.00 ml  59.23 ml/m LA Vol (A4C):   9.6 ml  5.94 ml/m LA Biplane Vol: 14.6 ml 9.01 ml/m  AORTIC VALVE                   PULMONIC VALVE AV Area (Vmax):    1.61 cm    PV Vmax:        0.42 m/s AV Area (Vmean):   1.45 cm    PV Peak grad:   0.7 mmHg AV Area (VTI):      1.65 cm    RVOT Peak grad: 1 mmHg AV Vmax:           113.67 cm/s AV Vmean:          86.200 cm/s AV VTI:            0.186 m AV Peak Grad:      5.2 mmHg AV Mean Grad:      3.7 mmHg LVOT Vmax:         58.40 cm/s LVOT Vmean:        39.900 cm/s LVOT VTI:          0.098 m LVOT/AV VTI ratio: 0.52  AORTA Ao Root diam: 2.90 cm MITRAL VALVE                        TRICUSPID VALVE MV Area (PHT): 6.32 cm             TR Peak grad:   52.6 mmHg MV PHT:        34.80 msec           TR Vmax:        364.00 cm/s MV Decel Time: 120 msec MV E velocity: 53.40 cm/s 103 cm/s  SHUNTS MV A velocity: 94.10 cm/s 70.3 cm/s Systemic VTI:  0.10 m MV E/A ratio:  0.57       1.5       Systemic Diam: 2.00 cm  Serafina Royals MD Electronically signed by Serafina Royals MD Signature Date/Time: 01/20/2019/4:41:26 PM    Final     PERFORMANCE STATUS (ECOG) : 2 - Symptomatic, <50% confined to bed  Review of Systems Unable to complete  Physical Exam General: NAD, frail appearing, thin Pulmonary: Unlabored, on O2 Extremities: no edema, no joint deformities Skin: no rashes Neurological: Poorly responsive, wakes briefly with stimulation, does not follow commands  IMPRESSION: Patient is known to me from her previous hospitalization.  Today, she remains poorly responsive, waking only briefly with stimulation.  It sounds like patient has not had any significant improvement in her mental status over the past several days.  It is unclear if her mental status will improve going forward.  It is likely that patient would not be a candidate for further chemotherapy in light of her altered mental status.    I called and  spoke with patient's sister, [name].  Together, we discussed patient's current medical problems.  Sister verbalized an understanding that patient may not improve and may ultimately be approaching end-of-life.  She became quite tearful and emotional during this conversation.  She says that she was hoping that patient could go to an SNF  for rehab, although it would seem patient would be a poor rehab candidate should her altered mental status persist.  We discussed the option of hospice but sister said she was not prepared to make any decisions at the present time.  I also discussed CODE STATUS with patient's sister.  Note that I previously discussed this with patient's sister during the recent hospitalization and sister verbalized clearly that she would not want patient to be resuscitated but struggled with making any final decisions.  She was hoping that she would be able to talk with patient at some point but now says that she was unable to do so.  She recognizes that patient would not be able to make her own decisions at the present time.  Sister again tells me that she would not want patient's life prolonged on machines and says "if it is her time, then just let her go."  However, sister again became quite emotional during this discussion and said she could not make a final decision and needed time to compose herself.  Patient's sister says she will call me back later.  Case discussed with Dr. Arbutus Ped and Dr. Rogue Bussing  PLAN: -Recommend best supportive care -Patient would be a reasonable candidate for hospice involvement -Would recommend DNR/DNI -Please consider consulting medical oncology -Will follow and continue to engage family in conversations regarding goals   Time Total: 60 minutes  Visit consisted of counseling and education dealing with the complex and emotionally intense issues of symptom management and palliative care in the setting of serious and potentially life-threatening illness.Greater than 50%  of this time was spent counseling and coordinating care related to the above assessment and plan.  Signed by: Altha Harm, PhD, NP-C

## 2019-02-11 NOTE — Progress Notes (Signed)
PROGRESS NOTE    Audrey Chan  [id number] DOB: February 16, 1951 DOA: 02/07/2019  PCP: Theotis Burrow, MD    [facility] Course To Date:  68 y.o.femalewith a history of recent PE with subsequent acute on chronic hypoxic respiratory failure for which she was admitted here on 12/14 and discharged on 12/17, underwentpulmonary thrombectomy on 12/2 by Dr. Lucky Cowboy.Presented to the ED after her sister found her to be altered, acutely confused and nonsensical speech.She checked her blood glucose and it was elevated, but patient later reported thatshe hadbeen having hypoglycemia (40s to 80s) recently.Nowitnessed seizure-like activity.No loss of consciousness. In the ED, mildly hypertensive, afebrile, initially required non-rebreather at 100% oxygen, weaned to nasal cannula. Labs showed mild hyponatremia, bicarb 17, glucose 368, BUN 40, creatinine 1.82. Ammonia level 36. Normal anion gap. Initially potassium 6.5 but was hemolyzed sample, repeat normal. CBC showed leukocytosis of 13.5 with neutrophilia, and anemia.UAshowed more than 500 glucose andUDSnegative. Head CT scan revealed unchanged atrophy and chronic ischemia with no acute intracranial abnormalities.Patient was given Narcan in the ED without initial improvement, but eventually did appear back to baseline. Admitted for further evaluation and workup of altered mental status. Neurology consulted. Evaluated for possible seizure vs CVA/TIA. EEG showed no epileptiform activity. Echocardiogram shows EF of 55-60% with no cardiac source of emboli noted. Carotid dopplers inconclusive for significant stenosis. MRI brain showed scattered infarcts in multiple distributions, consistent with embolic strokes.Lipid panel notable for triglycerides 210.Patient has been on Eliquis foracutePE and she and her sister report compliance taking it. Patient has been intermittently confused and with mildly slurred speech at  times.Evaluated by PT, recommend SNF. SLP evaluated swallow and recommend dysphagia-2 diet at this time. Of note, this is patient's third episode of CVA this year, first in August, 2nd last month and this admission. Per her sister, prior to this admission, patient was independent with ambulation and most ADL's. Sister was helping with her medications and insulin. Sister confirms labile blood sugars have been an issue for quite some time.  Subjective 12/22: Patient sleeping but open eyes to voice today.  When I ask her name, she closes her eyes.  When asked if having any pain, she shakes her head no.  Does not interact or answer further questions.  Appears in no distress.  No acute events reported.   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 No embolic source identified as of yet, see above narrative for workup to date.  --Neurology following --continue Eliquisand ASA --neuro checks --telemetry --glucose control, goal A1C < 7.0% --PT and OT recommending SNF  Acute encephalopathysecondary to above - improving  Chronic Hypoxic Respiratory Failure - patient on her baseline 2-3 L/min supplemental oxygen  Pulmonary embolism, recent, s/p thrombectomy, on Eliquis --stable respiratory status at this time --continue Eliquis  Labile Blood Glucosewith Hypoglycemic episodes, chronic per patient's sister Type 2 diabetes mellitus Now hyperglycemic, glucose > 300 --sensitivesliding scalecoverage --continue Novolog 3 units TID WC --continue Lantus to  15 units tonight (usually on 25 units qHS) --CBG's and hypoglyemia protocol  Hyperkalemia -  resolved.  K 4.8 this AM --gave IV Lasix yesterday, 12/21, for K 5.8  Dyslipidemia Continue statin   Peripheral neuropathy --continue gabapentin  Liver metastases, unknown primary. Incidental finding on recent imaging when patient admitted for acute PE. --further evaluation pending but on hold due to acute problems above  AKI on CKD Stage IIIa - present on admission. AKI resolved. Baseline creatine appears to be around 1.2-1.3, was 1.82 on admission. Likely due to poor PO intake, prerenal azotemia.   DVT prophylaxis: Eliquis   Code Status: Full Code  Family Communication: none at bedside      Disposition Plan:  SNF pending bed available and neurology clearance for discharge.   Consultants:   Neurology  Oncology - Dr. Rogue Bussing  Palliative Care  Procedures:   Echo  EEG  Antimicrobials:   None     Objective: Vitals:   02/10/19 1509 02/10/19 1946 02/11/19 0417 02/11/19 0814  BP: 126/72 106/72 (!) 145/84 (!) 146/77  Pulse: 94 97 (!) 108 97  Resp: 18   17  Temp: 98.4 F (36.9 C) 99.2 F (37.3 C) (!) 97.4 F (36.3 C) 98.2 F (36.8 C)  TempSrc: Oral Axillary Axillary Oral  SpO2:  100% 95% 93%  Weight:      Height:        Intake/Output Summary (Last 24 hours) at 02/11/2019 1533 Last data filed at 02/11/2019 0400 Gross per 24 hour  Intake 1684.98 ml  Output 700 ml  Net 984.98 ml   Filed Weights   02/07/19 0313  Weight: 60.3 kg    Examination:  General exam: sleeping comfortably, no acute distress HEENT: dry mucus membranes, hearing grossly normal  Respiratory system: clear to auscultation bilaterally, no wheezes, rales or rhonchi, normal respiratory effort. Cardiovascular system: normal S1/S2, RRR, no JVD, murmurs, rubs, gallops, no pedal edema.   Central nervous system: somnolent but arousable, currently unable to assess orientation as  patient does not answer questions Extremities: moves all, no edema, normal tone Psychiatry: normal mood, congruent affect, judgement and insight appear normal    Data Reviewed: I have personally reviewed following labs and imaging studies  CBC: Recent Labs  Lab 02/07/19 0414 02/08/19 0454 02/11/19 0334  WBC 13.5* 10.3 10.3  NEUTROABS 12.1*  --   --   HGB 10.3* 10.4* 11.1*  HCT 33.1* 33.7* 33.7*  MCV 73.7* 73.3* 68.1*  PLT 391 331 [id number]   Basic Metabolic Panel: Recent Labs  Lab 02/07/19 0414 02/07/19 0536 02/08/19 0454 02/09/19 0515 02/10/19 0614 02/10/19 1618 02/11/19 0334  NA 134*  --  141 135 136  --  138  K 6.5* 4.6 5.0 5.1 5.8* 4.7 4.8  CL 105  --  112* 105 104  --  108  CO2 17*  --  19* 20* 14*  --  17*  GLUCOSE 368*  --  72 316* 349*  --  248*  BUN 40*  --  31* 26* 28*  --  30*  CREATININE 1.82*  --  1.25* 1.23* 1.37*  --  1.44*  CALCIUM 8.1*  --  8.1* 8.2* 8.6*  --  8.8*  MG  --  1.9  --   --  1.9  --   --   PHOS  --  4.3  --   --   --   --   --    GFR: Estimated Creatinine Clearance: 34.5 mL/min (A) (by C-G formula based on SCr of 1.44 mg/dL (H)). Liver Function Tests: No results for input(s):  AST, ALT, ALKPHOS, BILITOT, PROT, ALBUMIN in the last 168 hours. No results for input(s): LIPASE, AMYLASE in the last 168 hours. Recent Labs  Lab 02/07/19 0414  AMMONIA 36*   Coagulation Profile: No results for input(s): INR, PROTIME in the last 168 hours. Cardiac Enzymes: No results for input(s): CKTOTAL, CKMB, CKMBINDEX, TROPONINI in the last 168 hours. BNP (last 3 results) No results for input(s): PROBNP in the last 8760 hours. HbA1C: No results for input(s): HGBA1C in the last 72 hours. CBG: Recent Labs  Lab 02/10/19 1215 02/10/19 1657 02/10/19 2134 02/11/19 0813 02/11/19 1138  GLUCAP 231* 158* 120* 202* 127*   Lipid Profile: Recent Labs    02/09/19 0515  CHOL 152  HDL 59  LDLCALC 51  TRIG 210*  CHOLHDL 2.6   Thyroid Function Tests: No  results for input(s): TSH, T4TOTAL, FREET4, T3FREE, THYROIDAB in the last 72 hours. Anemia Panel: No results for input(s): VITAMINB12, FOLATE, FERRITIN, TIBC, IRON, RETICCTPCT in the last 72 hours. Sepsis Labs: No results for input(s): PROCALCITON, LATICACIDVEN in the last 168 hours.  Recent Results (from the past 240 hour(s))  SARS CORONAVIRUS 2 (TAT 6-24 HRS) Nasopharyngeal Nasopharyngeal Swab     Status: None   Collection Time: 02/02/19  9:30 PM   Specimen: Nasopharyngeal Swab  Result Value Ref Range Status   SARS Coronavirus 2 NEGATIVE NEGATIVE Final    Comment: (NOTE) SARS-CoV-2 target nucleic acids are NOT DETECTED. The SARS-CoV-2 RNA is generally detectable in upper and lower respiratory specimens during the acute phase of infection. Negative results do not preclude SARS-CoV-2 infection, do not rule out co-infections with other pathogens, and should not be used as the sole basis for treatment or other patient management decisions. Negative results must be combined with clinical observations, patient history, and epidemiological information. The expected result is Negative. Fact Sheet for Patients: SugarRoll.be Fact Sheet for Healthcare Providers: https://www.woods-mathews.com/ This test is not yet approved or cleared by the [place] FDA and  has been authorized for detection and/or diagnosis of SARS-CoV-2 by FDA under an Emergency Use Authorization (EUA). This EUA will remain  in effect (meaning this test can be used) for the duration of the COVID-19 declaration under Section 56 4(b)(1) of the Act, 21 U.S.C. section 360bbb-3(b)(1), unless the authorization is terminated or revoked sooner. Performed at [facility], [place] [address], [place], [place] [address]   SARS CORONAVIRUS 2 (TAT 6-24 HRS) Nasopharyngeal Nasopharyngeal Swab     Status: None   Collection Time: 02/10/19  2:16 PM   Specimen: Nasopharyngeal Swab  Result  Value Ref Range Status   SARS Coronavirus 2 NEGATIVE NEGATIVE Final    Comment: (NOTE) SARS-CoV-2 target nucleic acids are NOT DETECTED. The SARS-CoV-2 RNA is generally detectable in upper and lower respiratory specimens during the acute phase of infection. Negative results do not preclude SARS-CoV-2 infection, do not rule out co-infections with other pathogens, and should not be used as the sole basis for treatment or other patient management decisions. Negative results must be combined with clinical observations, patient history, and epidemiological information. The expected result is Negative. Fact Sheet for Patients: SugarRoll.be Fact Sheet for Healthcare Providers: https://www.woods-mathews.com/ This test is not yet approved or cleared by the [place] FDA and  has been authorized for detection and/or diagnosis of SARS-CoV-2 by FDA under an Emergency Use Authorization (EUA). This EUA will remain  in effect (meaning this test can be used) for the duration of the COVID-19 declaration under Section 56 4(b)(1)  of the Act, 21 U.S.C. section 360bbb-3(b)(1), unless the authorization is terminated or revoked sooner. Performed at [facility], [place] [address] [place], [place] [address]          Radiology Studies: No results found.      Scheduled Meds: . apixaban  5 mg Oral BID  . aspirin EC  81 mg Oral Daily  . cholecalciferol  1,000 Units Oral Daily  . ferrous sulfate  325 mg Oral BID WC  . gabapentin  400 mg Oral TID  . insulin aspart  0-9 Units Subcutaneous TID WC  . insulin aspart  3 Units Subcutaneous TID WC  . insulin glargine  15 Units Subcutaneous QHS  . loratadine  10 mg Oral Daily  . metoprolol tartrate  12.5 mg Oral BID  . multivitamin with minerals  1 tablet Oral Daily  . rosuvastatin  20 mg Oral Daily   Continuous Infusions: . sodium chloride 75 mL/hr at 02/11/19 0400     LOS: 4 days    Time spent:  30-35 minutes    Ezekiel Slocumb, DO [facility]   If 7PM-7AM, please contact night-coverage [email or web address] Password [facility] 02/11/2019, 3:33 PM

## 2019-02-12 DIAGNOSIS — I63119 Cerebral infarction due to embolism of unspecified vertebral artery: Secondary | ICD-10-CM

## 2019-02-12 DIAGNOSIS — C787 Secondary malignant neoplasm of liver and intrahepatic bile duct: Secondary | ICD-10-CM

## 2019-02-12 DIAGNOSIS — J9611 Chronic respiratory failure with hypoxia: Secondary | ICD-10-CM

## 2019-02-12 LAB — GLUCOSE, CAPILLARY
Glucose-Capillary: 136 mg/dL — ABNORMAL HIGH (ref 70–99)
Glucose-Capillary: 156 mg/dL — ABNORMAL HIGH (ref 70–99)
Glucose-Capillary: 225 mg/dL — ABNORMAL HIGH (ref 70–99)
Glucose-Capillary: 200 mg/dL — ABNORMAL HIGH (ref 70–99)
Glucose-Capillary: 85 mg/dL (ref 70–99)

## 2019-02-12 LAB — AMMONIA: Ammonia: 21 umol/L (ref 9–35)

## 2019-02-12 MED ORDER — LACTULOSE 10 GM/15ML PO SOLN
30.0000 g | Freq: Two times a day (BID) | ORAL | Status: DC
  Administered 2019-02-12: 30 g via ORAL
  Filled 2019-02-12 (×2): qty 60

## 2019-02-12 MED ORDER — INSULIN ASPART 100 UNIT/ML ~~LOC~~ SOLN
2.0000 [IU] | Freq: Three times a day (TID) | SUBCUTANEOUS | Status: DC
  Administered 2019-02-12 – 2019-02-13 (×4): 2 [IU] via SUBCUTANEOUS
  Filled 2019-02-12 (×3): qty 1

## 2019-02-12 MED ORDER — LABETALOL HCL 5 MG/ML IV SOLN: 10.0000 mg | INTRAVENOUS | Status: DC | PRN

## 2019-02-12 NOTE — Progress Notes (Signed)
[place]  [phone] [phone]   Name: Audrey Chan Date: 02/12/2019 MRN: [id number]  DOB: 22-Apr-1950  Patient Care Team: Theotis Burrow, MD as PCP - General (Family Medicine) Flora Lipps, MD (Pulmonary Disease) [name], MD as Consulting Physician (Oncology)    REASON FOR CONSULTATION: Audrey Chan is a 68 y.o. female with multiple medical problems including COPD, chronic respiratory failure on home O2 at 2 L, history of CVA without residual deficits, CKD 3, who was recently hospitalized 01/22/2019-01/25/2019 for acute DVT/PE and underwent thrombectomy.  She was incidentally found to have hepatic metastases and underwent outpatient biopsy on 01/27/2019.  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  She was readmitted on 02/02/2019-02/06/2019 with acute on chronic hypoxic respiratory failure with suspected cor pulmonale due to recent PE.  Her hospitalization was complicated by confusion.  Patient was seen in consultation by medical oncology.  Family ultimately wanted to pursue systemic chemotherapy.  Patient was started on low-dose carbotaxol while in the hospital.  She was discharged home with home health.  Patient is now readmitted 02/07/2019 with altered mental status and hyperglycemia.  MRI of the brain on 12/18 revealed numerous areas of acute or subacute infarct in bilateral cerebral/cerebellar hemispheres with extensive chronic ischemic change, a chronic infarct in the left cerebellum, and scattered small areas of hemorrhage.  Patient has had persistently poor mentation.  Palliative care was consulted to help address goals.  CODE STATUS: DNR  PAST MEDICAL HISTORY: Past Medical History:  Diagnosis Date  . Allergic rhinitis   . Aneurysm of unspecified site ([place])   . Cholelithiasis   . Diabetes type 2, controlled ([place])   . GERD (gastroesophageal reflux  disease)   . Hypertension   . Leg DVT (deep venous thromboembolism), acute, bilateral ([place]) 01/21/2019  . Multiple subsegmental pulmonary emboli without acute cor pulmonale ([place]) 01/20/2019  . Neuropathy   . Veno-occlusive disease     PAST SURGICAL HISTORY:  Past Surgical History:  Procedure Laterality Date  . ABDOMINAL HYSTERECTOMY  1972  . CERVICAL FUSION    . CHOLECYSTECTOMY N/A 12/28/2014   Procedure: LAPAROSCOPIC CHOLECYSTECTOMY;  Surgeon: Sherri Rad, MD;  Location: [facility] ORS;  Service: General;  Laterality: N/A;  . EYE SURGERY     detached retina  . MASS EXCISION Left 05/06/2018   Procedure: EXCISION OF LEFT ANTECUBITAL CYST, DIABETIC;  Surgeon: Vickie Epley, MD;  Location: [facility] ORS;  Service: Vascular;  Laterality: Left;  . PULMONARY THROMBECTOMY Bilateral 01/22/2019   Procedure: PULMONARY THROMBECTOMY;  Surgeon: Algernon Huxley, MD;  Location: [facility];  Service: Cardiovascular;  Laterality: Bilateral;    HEMATOLOGY/ONCOLOGY HISTORY:  Oncology History Overview Note  # DEC 2020- CT scan- MULTIPLE LIVER LESIONS s/p Bx- HIGH GRADE ADENO CA [not melanoma/or vascular tumor]; CEA- 396; CA 15-3-56/CA 27-29-150;CA-19-9-N;   # Bil PE/right heart strain [s/p thrombectomy; Dr.Dew/Dr.Aleskerov; no lysis- sec to recent stroke]; on eliquis  # COPD/  # NGS/MOLECULAR TESTS:p  # PALLIATIVE CARE EVALUATION:p  # PAIN MANAGEMENT: p   DIAGNOSIS:   STAGE:         ;  GOALS:  CURRENT/MOST RECENT THERAPY :     Liver metastases ([place])   Initial Diagnosis   Liver metastases ([place])   02/05/2019 -  Chemotherapy   The patient had palonosetron (ALOXI) injection 0.25 mg, 0.25 mg, Intravenous,  Once, 1 of 3 cycles CARBOplatin (PARAPLATIN) 120 mg in sodium chloride 0.9 %  100 mL chemo infusion, 120 mg (100 % of original dose 117.2 mg), Intravenous,  Once, 1 of 3 cycles Dose modification:   (original dose 117.2 mg, Cycle 1) PACLitaxel (TAXOL) 132 mg in sodium chloride 0.9 % 250 mL  chemo infusion (</= '80mg'$ /m2), 80 mg/m2 = 132 mg, Intravenous,  Once, 1 of 3 cycles  for chemotherapy treatment.    Carcinoma of unknown primary ([place])  02/04/2019 Initial Diagnosis   Carcinoma of unknown primary ([place])   02/05/2019 -  Chemotherapy   The patient had palonosetron (ALOXI) injection 0.25 mg, 0.25 mg, Intravenous,  Once, 1 of 3 cycles CARBOplatin (PARAPLATIN) 120 mg in sodium chloride 0.9 % 100 mL chemo infusion, 120 mg (100 % of original dose 117.2 mg), Intravenous,  Once, 1 of 3 cycles Dose modification:   (original dose 117.2 mg, Cycle 1) PACLitaxel (TAXOL) 132 mg in sodium chloride 0.9 % 250 mL chemo infusion (</= '80mg'$ /m2), 80 mg/m2 = 132 mg, Intravenous,  Once, 1 of 3 cycles  for chemotherapy treatment.      ALLERGIES:  has No Known Allergies.  MEDICATIONS:  Current Facility-Administered Medications  Medication Dose Route Frequency Provider Last Rate Last Admin  . 0.9 %  sodium chloride infusion   Intravenous Continuous Ezekiel Slocumb, DO 75 mL/hr at 02/11/19 2355 New Bag at 02/11/19 2355  . acetaminophen (TYLENOL) tablet 650 mg  650 mg Oral Q6H PRN Mansy, Jan A, MD       Or  . acetaminophen (TYLENOL) suppository 650 mg  650 mg Rectal Q6H PRN Mansy, Jan A, MD      . albuterol (PROVENTIL) (2.5 MG/3ML) 0.083% nebulizer solution 2.5 mg  2.5 mg Inhalation Q6H PRN Mansy, Jan A, MD      . apixaban (ELIQUIS) tablet 5 mg  5 mg Oral BID Mansy, Jan A, MD   5 mg at 02/12/19 5102  . aspirin EC tablet 81 mg  81 mg Oral Daily Mansy, Jan A, MD   81 mg at 02/12/19 0853  . cholecalciferol (VITAMIN D3) tablet 1,000 Units  1,000 Units Oral Daily Mansy, Arvella Merles, MD   1,000 Units at 02/12/19 [phone]  . ferrous sulfate tablet 325 mg  325 mg Oral BID WC Mansy, Jan A, MD   325 mg at 02/12/19 0853  . fluticasone (FLONASE) 50 MCG/ACT nasal spray 2 spray  2 spray Each Nare Daily PRN Mansy, Jan A, MD      . gabapentin (NEURONTIN) capsule 400 mg  400 mg Oral TID Mansy, Jan A, MD   400 mg at 02/12/19  0853  . insulin aspart (novoLOG) injection 0-9 Units  0-9 Units Subcutaneous TID WC Mansy, Arvella Merles, MD   3 Units at 02/12/19 1338  . insulin aspart (novoLOG) injection 2 Units  2 Units Subcutaneous TID WC Nicole Kindred A, DO   2 Units at 02/12/19 1338  . insulin glargine (LANTUS) injection 15 Units  15 Units Subcutaneous QHS Nicole Kindred A, DO   15 Units at 02/11/19 0040  . ipratropium-albuterol (DUONEB) 0.5-2.5 (3) MG/3ML nebulizer solution 3 mL  3 mL Nebulization Q6H PRN Mansy, Jan A, MD      . labetalol (NORMODYNE) injection 10 mg  10 mg Intravenous Q4H PRN Nicole Kindred A, DO      . lactulose (CHRONULAC) 10 GM/15ML solution 30 g  30 g Oral BID Nicole Kindred A, DO   30 g at 02/12/19 1043  . lip balm (BLISTEX) ointment   Topical PRN Ezekiel Slocumb,  DO      . loratadine (CLARITIN) tablet 10 mg  10 mg Oral Daily Mansy, Jan A, MD   10 mg at 02/12/19 6644  . LORazepam (ATIVAN) injection 1 mg  1 mg Intravenous Q1H PRN Mansy, Jan A, MD      . magnesium hydroxide (MILK OF MAGNESIA) suspension 30 mL  30 mL Oral Daily PRN Mansy, Jan A, MD      . metoprolol tartrate (LOPRESSOR) tablet 12.5 mg  12.5 mg Oral BID Mansy, Jan A, MD   12.5 mg at 02/12/19 0853  . multivitamin with minerals tablet 1 tablet  1 tablet Oral Daily Mansy, Jan A, MD   1 tablet at 02/12/19 0853  . polyethylene glycol (MIRALAX / GLYCOLAX) packet 17 g  17 g Oral Daily PRN Mansy, Jan A, MD      . rosuvastatin (CRESTOR) tablet 20 mg  20 mg Oral Daily Mansy, Jan A, MD   20 mg at 02/12/19 0852    VITAL SIGNS: BP (!) 144/79 (BP Location: Right Arm)   Pulse (!) 103   Temp 98.6 F (37 C)   Resp 17   Ht '5\' 6"'$  (1.676 m)   Wt 132 lb 15 oz (60.3 kg)   SpO2 (!) 89%   BMI 21.46 kg/m  Filed Weights   02/07/19 0313  Weight: 132 lb 15 oz (60.3 kg)    Estimated body mass index is 21.46 kg/m as calculated from the following:   Height as of this encounter: '5\' 6"'$  (1.676 m).   Weight as of this encounter: 132 lb 15 oz (60.3  kg).  LABS: CBC:    Component Value Date/Time   WBC 10.3 02/11/2019 0334   HGB 11.1 (L) 02/11/2019 0334   HCT 33.7 (L) 02/11/2019 0334   PLT 295 02/11/2019 0334   MCV 68.1 (L) 02/11/2019 0334   NEUTROABS 12.1 (H) 02/07/2019 0414   LYMPHSABS 0.6 (L) 02/07/2019 0414   MONOABS 0.6 02/07/2019 0414   EOSABS 0.0 02/07/2019 0414   BASOSABS 0.0 02/07/2019 0414   Comprehensive Metabolic Panel:    Component Value Date/Time   NA 138 02/11/2019 0334   K 4.8 02/11/2019 0334   CL 108 02/11/2019 0334   CO2 17 (L) 02/11/2019 0334   BUN 30 (H) 02/11/2019 0334   CREATININE 1.44 (H) 02/11/2019 0334   GLUCOSE 248 (H) 02/11/2019 0334   CALCIUM 8.8 (L) 02/11/2019 0334   AST 130 (H) 02/02/2019 1929   ALT 92 (H) 02/02/2019 1929   ALKPHOS 1,819 (H) 02/02/2019 1929   BILITOT 1.0 02/02/2019 1929   PROT 7.4 02/02/2019 1929   ALBUMIN 3.2 (L) 02/02/2019 1929    RADIOGRAPHIC STUDIES: EEG  Result Date: 02/07/2019 Audrey Goodell, MD     02/07/2019  3:58 PM ELECTROENCEPHALOGRAM REPORT Patient: Audrey Chan       Room #: IH47Q EEG No. ID: [id number] Age: 68 y.o.        Sex: female Referring Physician: Mansy Report Date:  02/07/2019       Interpreting Physician: Audrey Chan History: ASHIYA KINKEAD is an 68 y.o. female with altered mental status Medications: Eliquis, ASA, Neurontin, Insulin, Claritin. Lopressor, Crestor Conditions of Recording:  This is a 21 channel routine scalp EEG performed with bipolar and monopolar montages arranged in accordance to the international 10/20 system of electrode placement. One channel was dedicated to EKG recording. The patient is in the awake and drowsy states. Description:  The background activity is slow and poorly organized.  It is dominated by a low voltage theta activity that is diffusely distributed. Some intermittent underlying delta activity is noted at times as well.  No epileptiform activity is noted.  Hyperventilation was not performed.  Intermittent  photic stimulation was performed but failed to illicit any change in the tracing. IMPRESSION: This is an abnormal EEG secondary to general background slowing.  This finding may be seen with a diffuse disturbance that is etiologically nonspecific, but may include a metabolic encephalopathy, among other possibilities.  No epileptiform activity was noted.  Audrey Goodell, MD Neurology [phone] 02/07/2019, 3:52 PM   CT ABDOMEN PELVIS WO CONTRAST  Result Date: 02/02/2019 CLINICAL DATA:  Abdominal distension. Recent diagnosis of pulmonary embolus. Multiple liver lesions post biopsy. EXAM: CT ABDOMEN AND PELVIS WITHOUT CONTRAST TECHNIQUE: Multidetector CT imaging of the abdomen and pelvis was performed following the standard protocol without IV contrast. COMPARISON:  Chest CTA earlier this day. Abdominal CT 2 weeks ago 01/20/2019 FINDINGS: Lower chest: Emphysema scattered subpleural nodules, assessed on chest CT earlier this day. Hepatobiliary: Heterogeneous liver parenchyma. Multiple liver lesions are better defined on prior contrast-enhanced exam. Difficult to assess for change given differences in technique. Postcholecystectomy similar proximal biliary prominence to prior. Motion artifact limits detailed assessment. Pancreas: Motion obscured. Coarse parenchymal calcification in the pancreatic tail. Ductal dilatation in the tail measures up to 8 mm. Heterogeneous low-density in the pancreatic tail, series 2, image 16. Detailed parenchymal assessment is limited in the lack of contrast and motion. Spleen: Normal in size without focal abnormality. Adrenals/Urinary Tract: Bilateral adrenal thickening. No hydronephrosis. There is excreted IV contrast in both renal collecting systems from prior chest CTA. Excreted IV contrast in the urinary bladder. No bladder wall thickening. Stomach/Bowel: Bowel evaluation is limited in the absence of contrast and patient motion. Also paucity of intra-abdominal fat. Stomach is  nondistended. No small bowel obstruction or obvious inflammation. Normal appendix. Moderate stool burden throughout the colon. No colonic wall thickening or inflammation. Vascular/Lymphatic: Moderate aortic atherosclerosis. No aneurysm. Limited assessment for adenopathy in the absence of contrast and patient motion. Reproductive: Post hysterectomy per history. No evidence of adnexal mass. Other: Small amount of perihepatic free fluid which has slightly increased in size from prior exam. Fluid is slightly dense and likely represents combination of simple ascites and post biopsy hemorrhage. Small amount of blood dependently in the pelvis. Cannot assess for active extravasation in the absence of IV contrast, however volume of fluid would not suggest this. Musculoskeletal: Postsurgical change of the anterior abdominal wall. A focal bone abnormality. IMPRESSION: 1. Post recent liver biopsy. Small amount of high-density fluid adjacent to the liver and tracking in the pelvis likely post biopsy hemorrhage. Cannot assess for active extravasation in the absence of IV contrast, however volume of fluid does not suggest this. Multiple liver lesions are better characterized on prior contrast-enhanced exam. 2. Coarse parenchymal calcification in the pancreatic tail with heterogeneous low-density and distal pancreatic ductal dilatation. In the setting of multiple hepatic lesions, findings are concerning for pancreatic neoplasm, however not well-defined on this noncontrast exam. Aortic Atherosclerosis (ICD10-I70.0). Electronically Signed   By: Keith Rake M.D.   On: 02/02/2019 23:20   CT Head Wo Contrast  Result Date: 02/07/2019 CLINICAL DATA:  Altered mental status. Recent diagnosis of liver metastasis. EXAM: CT HEAD WITHOUT CONTRAST TECHNIQUE: Contiguous axial images were obtained from the base of the skull through the vertex without intravenous contrast. COMPARISON:  Head CT 5 days ago 02/02/2019. Brain MRI 01/01/2019  FINDINGS: Brain: No  intracranial hemorrhage, mass effect, or midline shift. Stable brain volume. No hydrocephalus. The basilar cisterns are patent. Unchanged chronic small vessel ischemia. Remote left cerebellar infarct. Small additional cerebellar infarct on MRI last month is not well seen by CT. No evidence of territorial infarct or acute ischemia. No extra-axial or intracranial fluid collection. Vascular: No hyperdense vessel. Skull: No fracture or focal lesion. Sinuses/Orbits: Paranasal sinuses and mastoid air cells are clear. The visualized orbits are unremarkable. Other: None. IMPRESSION: 1. No acute intracranial abnormality. 2. Unchanged atrophy and chronic ischemia. Electronically Signed   By: Keith Rake M.D.   On: 02/07/2019 03:54   CT Head Wo Contrast  Result Date: 02/02/2019 CLINICAL DATA:  Altered mental status EXAM: CT HEAD WITHOUT CONTRAST TECHNIQUE: Contiguous axial images were obtained from the base of the skull through the vertex without intravenous contrast. COMPARISON:  12/31/2018 FINDINGS: Brain: Chronic atrophic and ischemic changes are again seen and stable. No acute hemorrhage, acute infarction or space-occupying mass lesion are seen. Vascular: No hyperdense vessel or unexpected calcification. Skull: Normal. Negative for fracture or focal lesion. Sinuses/Orbits: No acute finding. Other: None. IMPRESSION: Chronic atrophic and ischemic changes similar to that seen on the prior exam. No acute abnormality is noted. Electronically Signed   By: Inez Catalina M.D.   On: 02/02/2019 20:31   CT Angio Chest PE W/Cm &/Or Wo Cm  Result Date: 02/02/2019 CLINICAL DATA:  Shortness of breath EXAM: CT ANGIOGRAPHY CHEST WITH CONTRAST TECHNIQUE: Multidetector CT imaging of the chest was performed using the standard protocol during bolus administration of intravenous contrast. Multiplanar CT image reconstructions and MIPs were obtained to evaluate the vascular anatomy. CONTRAST:  28m OMNIPAQUE  IOHEXOL 350 MG/ML SOLN COMPARISON:  01/20/2019 FINDINGS: Cardiovascular: Thoracic aorta demonstrates atherosclerotic calcifications without aneurysmal dilatation or dissection. The heart is at the upper limits of normal in size. The pulmonary artery is well visualized with a normal branching pattern. The previously seen pulmonary emboli are again identified bilaterally with some minimal improvement in the small central branches. No significant resolution of the more peripheral emboli is noted. No new significant emboli are noted. Changes of right heart strain are again noted and stable. Mediastinum/Nodes: The esophagus as visualized is within normal limits. No hilar or mediastinal adenopathy is seen. The thoracic inlet is within normal limits. Lungs/Pleura: Diffuse emphysematous changes are noted throughout both lungs. Scattered subpleural nodular changes are seen without significant increase from the prior study. No focal confluent infiltrate is seen. No sizable effusion is noted. No pneumothorax seen. Upper Abdomen: Visualized upper abdomen demonstrates mild perihepatic fluid stable from the prior exam. Stable hypodense lesions are noted throughout the liver consistent with the given clinical history of hepatic metastatic disease. Musculoskeletal: Degenerative changes of the thoracic spine are noted. No acute bony abnormality is seen. Review of the MIP images confirms the above findings. IMPRESSION: Slight improvement in pulmonary emboli bilaterally although the peripheral emboli appear stable. No new emboli are seen. Persistent right heart strain. Emphysematous changes without acute infiltrate. Multiple pulmonary nodules stable from the prior exam which may represent some metastatic disease Hepatic metastatic disease. Aortic Atherosclerosis (ICD10-I70.0) and Emphysema (ICD10-J43.9). Electronically Signed   By: MInez CatalinaM.D.   On: 02/02/2019 20:39   CT Angio Chest PE W/Cm &/Or Wo Cm  Result Date:  01/20/2019 CLINICAL DATA:  Suspected covid, elevated LFTs, recent stroke EXAM: CT ANGIOGRAPHY CHEST, abdomen, and pelvis WITH CONTRAST TECHNIQUE: Multidetector CT imaging of the chest, abdomen, and pelvis was performed using the standard  protocol during bolus administration of intravenous contrast. Multiplanar CT image reconstructions and MIPs were obtained to evaluate the vascular anatomy. CONTRAST:  47m OMNIPAQUE IOHEXOL 350 MG/ML SOLN COMPARISON:  CT chest September 13, 2017 FINDINGS: Cardiovascular: There is a optimal opacification of the pulmonary arteries. There is a small nonocclusive thrombus seen within the left main pulmonary artery which extends into the anterior left upper lobe segmental branches and partially occlusive thrombus in the posterior left lower lobe subsegmental branches. There is also partially occlusive thrombus seen within the posterior right upper lobe segmental branch, series 6, image 37. There is also partially occlusive thrombus seen extending into the right middle lobe subsegmental branches and posterior right lower lobe subsegmental branches. The heart is normal in size. No pericardial effusion or thickening. There is slight straightening of the interventricular septum which could be due to early right ventricular heart strain. There is normal three-vessel brachiocephalic anatomy without proximal stenosis. Scattered atherosclerosis is noted. The thoracic aorta is normal in appearance. Mediastinum/Nodes: No hilar, mediastinal, or axillary adenopathy. Thyroid gland, trachea, and esophagus demonstrate no significant findings. Lungs/Pleura: Small subcentimeter pulmonary nodule are again identified as on prior exam dating back to 2019, for example in the posterior right upper lobe there is a 5 mm pulmonary nodule, series 8, image 21 and anterior right lower lobe, series 8, image 54 a 4 mm pulmonary nodule. There is however a new small nodular opacity in the posterior left upper lobe, series  8, image 47 measuring 7 mm not seen on prior exam. No large airspace consolidation or pleural effusion is seen. Centrilobular emphysematous changes seen within both lung apices. Musculoskeletal: No chest wall abnormality. No acute or significant osseous findings. Review of the MIP images confirms the above findings. Abdomen/pelvis: Hepatobiliary: There are multiple low-density liver lesions seen throughout which were not seen on the prior exam. There is also heterogeneous enhancement seen throughout the liver parenchyma. Central periportal edema is noted. The portal vein is patent. The patient is status post cholecystectomy. A small amount of perihepatic ascites is noted. Pancreas: Unremarkable. No pancreatic ductal dilatation or surrounding inflammatory changes. Spleen: Normal in size without focal abnormality. Adrenals/Urinary Tract: Both adrenal glands appear normal. The kidneys and collecting system appear normal without evidence of urinary tract calculus or hydronephrosis. Bladder is unremarkable. Stomach/Bowel: The stomach, small bowel, and colon are normal in appearance. No inflammatory changes, wall thickening, or obstructive findings. Vascular/Lymphatic: There are no enlarged mesenteric, retroperitoneal, or pelvic lymph nodes. Scattered aortic atherosclerotic calcifications are seen without aneurysmal dilatation. Reproductive: The uterus and adnexa are unremarkable. Other: No evidence of abdominal wall mass or hernia. Musculoskeletal: No acute or significant osseous findings. IMPRESSION: 1. Bilateral partially occlusive segmental and subsegmental pulmonary emboli as described above. 2. Findings which could be suggestive of early right ventricular heart strain. 3. Multiple unchanged subcentimeter pulmonary nodules as on prior exam dating back to 2019. 4. New nodular opacity within the posterior left upper lobe measuring 7 mm which is nonspecific, and would recommend attention on short interval follow-up  exam. 5. Centrilobular emphysematous changes. 6. Numerous hepatic lesions throughout, likely suggestive of hepatic metastatic disease. 7. Small amount of perihepatic ascites 8. These results were called by telephone at the time of interpretation on 01/20/2019 at 2:07 am to provider [facility], who verbally acknowledged these results. Electronically Signed   By: BPrudencio PairM.D.   On: 01/20/2019 02:09   MR BRAIN WO CONTRAST  Result Date: 02/07/2019 CLINICAL DATA:  TIA.  Renal failure  and stage IV liver cancer. EXAM: MRI HEAD WITHOUT CONTRAST TECHNIQUE: Multiplanar, multiecho pulse sequences of the brain and surrounding structures were obtained without intravenous contrast. COMPARISON:  MRI head 01/01/2019.  CT head 02/07/2019 FINDINGS: Brain: Multiple small areas of acute infarct throughout both cerebellar hemispheres. Small areas of acute/subacute infarct in both cerebral hemispheres involving the frontal and parietal lobes. Extensive chronic microvascular ischemic change throughout the cerebral white matter bilaterally. Chronic infarcts in the cerebellum on the left. Small areas of hemorrhage in the right temporal and parietal lobe, not seen previously. Methemoglobin also noted in the parietal lobe bilaterally due to prior hemorrhage. Methemoglobin also noted in the left cerebellum at site of prior infarct. Ventricle size normal.  No mass lesion or midline shift Vascular: Normal arterial flow voids. Skull and upper cervical spine: Negative. Cervical fusion hardware noted. Sinuses/Orbits: Negative Other: None IMPRESSION: Numerous small areas of acute or subacute infarct in both cerebral hemispheres and both cerebellar hemispheres. Findings are suggestive of emboli. Extensive chronic ischemic change in the white matter bilaterally. Chronic infarct left cerebellum. Scattered small areas of hemorrhage in the brain which appear to be subacute to chronic with evidence of methemoglobin several areas of  hemorrhage. Electronically Signed   By: Franchot Gallo M.D.   On: 02/07/2019 12:08   CT abd/pelvis w/ IV contrast  Result Date: 01/20/2019 CLINICAL DATA:  Suspected covid, elevated LFTs, recent stroke EXAM: CT ANGIOGRAPHY CHEST, abdomen, and pelvis WITH CONTRAST TECHNIQUE: Multidetector CT imaging of the chest, abdomen, and pelvis was performed using the standard protocol during bolus administration of intravenous contrast. Multiplanar CT image reconstructions and MIPs were obtained to evaluate the vascular anatomy. CONTRAST:  95m OMNIPAQUE IOHEXOL 350 MG/ML SOLN COMPARISON:  CT chest September 13, 2017 FINDINGS: Cardiovascular: There is a optimal opacification of the pulmonary arteries. There is a small nonocclusive thrombus seen within the left main pulmonary artery which extends into the anterior left upper lobe segmental branches and partially occlusive thrombus in the posterior left lower lobe subsegmental branches. There is also partially occlusive thrombus seen within the posterior right upper lobe segmental branch, series 6, image 37. There is also partially occlusive thrombus seen extending into the right middle lobe subsegmental branches and posterior right lower lobe subsegmental branches. The heart is normal in size. No pericardial effusion or thickening. There is slight straightening of the interventricular septum which could be due to early right ventricular heart strain. There is normal three-vessel brachiocephalic anatomy without proximal stenosis. Scattered atherosclerosis is noted. The thoracic aorta is normal in appearance. Mediastinum/Nodes: No hilar, mediastinal, or axillary adenopathy. Thyroid gland, trachea, and esophagus demonstrate no significant findings. Lungs/Pleura: Small subcentimeter pulmonary nodule are again identified as on prior exam dating back to 2019, for example in the posterior right upper lobe there is a 5 mm pulmonary nodule, series 8, image 21 and anterior right lower  lobe, series 8, image 54 a 4 mm pulmonary nodule. There is however a new small nodular opacity in the posterior left upper lobe, series 8, image 47 measuring 7 mm not seen on prior exam. No large airspace consolidation or pleural effusion is seen. Centrilobular emphysematous changes seen within both lung apices. Musculoskeletal: No chest wall abnormality. No acute or significant osseous findings. Review of the MIP images confirms the above findings. Abdomen/pelvis: Hepatobiliary: There are multiple low-density liver lesions seen throughout which were not seen on the prior exam. There is also heterogeneous enhancement seen throughout the liver parenchyma. Central periportal edema is noted. The portal  vein is patent. The patient is status post cholecystectomy. A small amount of perihepatic ascites is noted. Pancreas: Unremarkable. No pancreatic ductal dilatation or surrounding inflammatory changes. Spleen: Normal in size without focal abnormality. Adrenals/Urinary Tract: Both adrenal glands appear normal. The kidneys and collecting system appear normal without evidence of urinary tract calculus or hydronephrosis. Bladder is unremarkable. Stomach/Bowel: The stomach, small bowel, and colon are normal in appearance. No inflammatory changes, wall thickening, or obstructive findings. Vascular/Lymphatic: There are no enlarged mesenteric, retroperitoneal, or pelvic lymph nodes. Scattered aortic atherosclerotic calcifications are seen without aneurysmal dilatation. Reproductive: The uterus and adnexa are unremarkable. Other: No evidence of abdominal wall mass or hernia. Musculoskeletal: No acute or significant osseous findings. IMPRESSION: 1. Bilateral partially occlusive segmental and subsegmental pulmonary emboli as described above. 2. Findings which could be suggestive of early right ventricular heart strain. 3. Multiple unchanged subcentimeter pulmonary nodules as on prior exam dating back to 2019. 4. New nodular opacity  within the posterior left upper lobe measuring 7 mm which is nonspecific, and would recommend attention on short interval follow-up exam. 5. Centrilobular emphysematous changes. 6. Numerous hepatic lesions throughout, likely suggestive of hepatic metastatic disease. 7. Small amount of perihepatic ascites 8. These results were called by telephone at the time of interpretation on 01/20/2019 at 2:07 am to provider [facility] , who verbally acknowledged these results. Electronically Signed   By: Prudencio Pair M.D.   On: 01/20/2019 02:09   US Carotid Bilateral  Result Date: 02/07/2019 CLINICAL DATA:  68 year old female with a history of TIA EXAM: BILATERAL CAROTID DUPLEX ULTRASOUND TECHNIQUE: Pearline Cables scale imaging, color Doppler and duplex ultrasound were performed of bilateral carotid and vertebral arteries in the neck. COMPARISON:  None. FINDINGS: Criteria: Quantification of carotid stenosis is based on velocity parameters that correlate the residual internal carotid diameter with NASCET-based stenosis levels, using the diameter of the distal internal carotid lumen as the denominator for stenosis measurement. The following velocity measurements were obtained: RIGHT ICA:  Systolic 94 cm/sec, Diastolic 28 cm/sec CCA:  43 cm/sec SYSTOLIC ICA/CCA RATIO:  2.2 ECA:  43 cm/sec LEFT ICA:  Systolic 88 cm/sec, Diastolic 29 cm/sec CCA:  42 cm/sec SYSTOLIC ICA/CCA RATIO:  2.1 ECA:  34 cm/sec Right Brachial SBP: Not acquired Left Brachial SBP: Not acquired RIGHT CAROTID ARTERY: No significant calcified disease of the right common carotid artery. Intermediate waveform maintained. Heterogeneous plaque without significant calcifications at the right carotid bifurcation. Low resistance waveform of the right ICA. No significant tortuosity. RIGHT VERTEBRAL ARTERY: Antegrade flow with low resistance waveform. LEFT CAROTID ARTERY: No significant calcified disease of the left common carotid artery. Intermediate waveform maintained.  Heterogeneous plaque at the left carotid bifurcation without significant calcifications. Low resistance waveform of the left ICA. LEFT VERTEBRAL ARTERY:  Antegrade flow with low resistance waveform. Additional: Incidental bilateral thyroid nodules. IMPRESSION: Mild heterogeneous plaque at the bilateral carotid bifurcation, with discordant results regarding degree of stenosis by established duplex criteria. Peak velocity suggests no significant stenosis, with the ICA/ CCA ratio suggesting a greater degree of stenosis, bilaterally. If establishing a more accurate degree of stenosis is required, cerebral angiogram should be considered, or as a second best test, CTA. Bilateral thyroid nodules. Referral for a dedicated thyroid ultrasound may be considered. Signed, Dulcy Fanny. Dellia Nims, RPVI [facility] Radiology Electronically Signed   By: Corrie Mckusick D.O.   On: 02/07/2019 12:18   PERIPHERAL VASCULAR CATHETERIZATION  Result Date: 01/22/2019 See op note  US Venous  Img Lower Bilateral (DVT)  Result Date: 01/20/2019 CLINICAL DATA:  History of pulmonary embolism, now with lower extremity pain. Evaluate for DVT. EXAM: BILATERAL LOWER EXTREMITY VENOUS DOPPLER ULTRASOUND TECHNIQUE: Gray-scale sonography with graded compression, as well as color Doppler and duplex ultrasound were performed to evaluate the lower extremity deep venous systems from the level of the common femoral vein and including the common femoral, femoral, profunda femoral, popliteal and calf veins including the posterior tibial, peroneal and gastrocnemius veins when visible. The superficial great saphenous vein was also interrogated. Spectral Doppler was utilized to evaluate flow at rest and with distal augmentation maneuvers in the common femoral, femoral and popliteal veins. COMPARISON:  Chest CTA-01/20/2019 FINDINGS: RIGHT LOWER EXTREMITY Common Femoral Vein: No evidence of thrombus. Normal  compressibility, respiratory phasicity and response to augmentation. Saphenofemoral Junction: No evidence of thrombus. Normal compressibility and flow on color Doppler imaging. Profunda Femoral Vein: No evidence of thrombus. Normal compressibility and flow on color Doppler imaging. Femoral Vein: No evidence of thrombus. Normal compressibility, respiratory phasicity and response to augmentation. Popliteal Vein: No evidence of thrombus. Normal compressibility, respiratory phasicity and response to augmentation. Calf Veins: There is hypoechoic occlusive thrombus within both paired right peroneal veins (images 58 and 59). Both paired right posterior tibial veins appear patent where imaged. Superficial Great Saphenous Vein: No evidence of thrombus. Normal compressibility. Other Findings:  None. _________________________________________________________ LEFT LOWER EXTREMITY Common Femoral Vein: No evidence of thrombus. Normal compressibility, respiratory phasicity and response to augmentation. Saphenofemoral Junction: No evidence of thrombus. Normal compressibility and flow on color Doppler imaging. Profunda Femoral Vein: No evidence of thrombus. Normal compressibility and flow on color Doppler imaging. Femoral Vein: No evidence of thrombus. Normal compressibility, respiratory phasicity and response to augmentation. Popliteal Vein: No evidence of thrombus. Normal compressibility, respiratory phasicity and response to augmentation. Calf Veins: There is hypoechoic occlusive thrombus within 1 of the paired left peroneal veins (images 29 and 30). The adjacent paired left peroneal vein as well though both paired posterior tibial veins appear patent. Superficial Great Saphenous Vein: No evidence of thrombus. Normal compressibility. Other Findings:  None. IMPRESSION: Examination is positive for occlusive DVT involving both paired right peroneal veins as well as one of two paired left peroneal veins. There is no extension of this  distal tibial DVT to the more proximal venous system of either lower extremity. Electronically Signed   By: [name] Mariscal M.D.   On: 01/20/2019 17:02   CT BIOPSY  Result Date: 01/27/2019 INDICATION: Multiple liver lesions. EXAM: CT directed liver biopsy. MEDICATIONS: None. ANESTHESIA/SEDATION: Moderate (conscious) sedation was employed during this procedure. A total of Versed 2 mg and Fentanyl 25 mcg was administered intravenously. Moderate Sedation Time: 14 minutes. The patient's level of consciousness and vital signs were monitored continuously by radiology nursing throughout the procedure under my direct supervision. FLUOROSCOPY TIME:  Fluoroscopy Time: 0 minutes 0 seconds (0 mGy). COMPLICATIONS: None immediate. PROCEDURE: After discussing the risks and benefits of this procedure the patient informed consent was obtained. Standard time-out procedure was performed. Patient was sterilely prepped and draped. Following local anesthesia with 1% lidocaine and IV conscious sedation CT-directed liver lesion core biopsy was obtained with 2 good 23 mm 18 gauge core biopsy samples obtained. Sample sent to pathology. There no complications. IMPRESSION: Successful liver lesion CT-directed biopsy. Electronically Signed   By: Marcello Moores  Register   On: 01/27/2019 11:47   DG Chest Portable 1 View  Result Date: 02/07/2019 CLINICAL DATA:  Hypoxia. EXAM: PORTABLE CHEST 1 VIEW  COMPARISON:  Chest radiograph and CTA 5 days ago 02/02/2019 FINDINGS: Lower lung volumes from prior exam. Emphysema. Small pulmonary nodules on prior CT are not well visualized radiographically. No pulmonary edema, focal airspace disease, pleural effusion or pneumothorax. No acute osseous abnormalities are seen. IMPRESSION: 1. Emphysema without acute abnormality. 2. Small pulmonary nodules on prior CT are not well visualized radiographically. Electronically Signed   By: Keith Rake M.D.   On: 02/07/2019 03:44   DG Chest Portable 1 View  Result  Date: 02/02/2019 CLINICAL DATA:  Shortness of breath, stomach pain EXAM: PORTABLE CHEST 1 VIEW COMPARISON:  01/23/2019 FINDINGS: The heart size and mediastinal contours are within normal limits. Emphysema. The visualized skeletal structures are unremarkable. IMPRESSION: Emphysema without acute abnormality of the lungs in AP portable projection. Electronically Signed   By: Eddie Candle M.D.   On: 02/02/2019 19:06   DG Chest Port 1 View  Result Date: 01/23/2019 CLINICAL DATA:  Hypertension, pulmonary emboli EXAM: PORTABLE CHEST 1 VIEW COMPARISON:  01/20/2019, 01/19/2019 FINDINGS: The heart size and mediastinal contours are stable. No new focal airspace consolidation, pleural effusion, or pneumothorax. Lungs are mildly hyperexpanded. The visualized skeletal structures are unremarkable. IMPRESSION: Stable appearance of the chest without new airspace opacity. Electronically Signed   By: Davina Poke M.D.   On: 01/23/2019 12:54   DG Chest Portable 1 View  Result Date: 01/19/2019 CLINICAL DATA:  Shortness of breath EXAM: PORTABLE CHEST 1 VIEW COMPARISON:  12/31/2018 FINDINGS: Cardiomegaly. Lungs clear. No effusions or edema. No acute bony abnormality. IMPRESSION: Cardiomegaly.  No active disease. Electronically Signed   By: Rolm Baptise M.D.   On: 01/19/2019 23:33   ECHOCARDIOGRAM COMPLETE  Result Date: 02/03/2019   ECHOCARDIOGRAM REPORT   Patient Name:   KORRYN PANCOAST Date of Exam: 02/03/2019 Medical Rec #:  [id number]         Height:       66.0 in Accession #:    [id number]        Weight:       132.0 lb Date of Birth:  1950-12-07         BSA:          1.68 m Patient Age:    1 years          BP:           145/86 mmHg Patient Gender: F                 HR:           95 bpm. Exam Location:  [facility] Procedure: 2D Echo, Cardiac Doppler and Color Doppler Indications:     I50.31 CHF-Acute Diastolic  History:         Patient has prior history of Echocardiogram examinations, most                  recent  01/20/2019. Risk Factors:Hypertension and Diabetes.  Sonographer:     Charmayne Sheer RDCS (AE) Referring Phys:  [id number] Athena Masse Diagnosing Phys: Kathlyn Sacramento MD  Sonographer Comments: Suboptimal subcostal window. IMPRESSIONS  1. Left ventricular ejection fraction, by visual estimation, is 55 to 60%. The left ventricle has normal function. There is mildly increased left ventricular hypertrophy.  2. Left ventricular diastolic parameters are consistent with Grade I diastolic dysfunction (impaired relaxation).  3. Right ventricular pressure overload.  4. The left ventricle has no regional wall motion abnormalities.  5. Global right ventricle has mildly reduced systolic function.The right ventricular  size is moderately enlarged. No increase in right ventricular wall thickness.  6. Left atrial size was normal.  7. Right atrial size was moderately dilated.  8. The mitral valve is normal in structure. No evidence of mitral valve regurgitation. No evidence of mitral stenosis.  9. The tricuspid valve is normal in structure. Tricuspid valve regurgitation moderate. 10. The aortic valve is normal in structure. Aortic valve regurgitation is not visualized. Mild to moderate aortic valve sclerosis/calcification without any evidence of aortic stenosis. 11. The pulmonic valve was normal in structure. Pulmonic valve regurgitation is not visualized. 12. Moderately to severely elevated pulmonary artery systolic pressure. 13. The tricuspid regurgitant velocity is 4.02 m/s, and with an assumed right atrial pressure of 5 mmHg, the estimated right ventricular systolic pressure is moderately elevated at 69.6 mmHg. FINDINGS  Left Ventricle: Left ventricular ejection fraction, by visual estimation, is 55 to 60%. The left ventricle has normal function. The left ventricle has no regional wall motion abnormalities. There is mildly increased left ventricular hypertrophy. The interventricular septum is flattened in systole, consistent with  right ventricular pressure overload. Left ventricular diastolic parameters are consistent with Grade I diastolic dysfunction (impaired relaxation). Normal left atrial pressure. Right Ventricle: The right ventricular size is moderately enlarged. No increase in right ventricular wall thickness. Global RV systolic function is has mildly reduced systolic function. The tricuspid regurgitant velocity is 4.02 m/s, and with an assumed right atrial pressure of 5 mmHg, the estimated right ventricular systolic pressure is moderately elevated at 69.6 mmHg. Left Atrium: Left atrial size was normal in size. Right Atrium: Right atrial size was moderately dilated Pericardium: There is no evidence of pericardial effusion. Mitral Valve: The mitral valve is normal in structure. No evidence of mitral valve regurgitation. No evidence of mitral valve stenosis by observation. MV peak gradient, 4.4 mmHg. Tricuspid Valve: The tricuspid valve is normal in structure. Tricuspid valve regurgitation moderate. Aortic Valve: The aortic valve is normal in structure. Aortic valve regurgitation is not visualized. Mild to moderate aortic valve sclerosis/calcification is present, without any evidence of aortic stenosis. Aortic valve mean gradient measures 4.0 mmHg. Aortic valve peak gradient measures 6.9 mmHg. Aortic valve area, by VTI measures 2.09 cm. Pulmonic Valve: The pulmonic valve was normal in structure. Pulmonic valve regurgitation is not visualized. Pulmonic regurgitation is not visualized. Aorta: The aortic root, ascending aorta and aortic arch are all structurally normal, with no evidence of dilitation or obstruction. IAS/Shunts: No atrial level shunt detected by color flow Doppler. There is no evidence of a patent foramen ovale. No ventricular septal defect is seen or detected. There is no evidence of an atrial septal defect.  LEFT VENTRICLE PLAX 2D LVIDd:         3.70 cm  Diastology LVIDs:         2.41 cm  LV e' lateral:   6.64 cm/s LV  PW:         0.78 cm  LV E/e' lateral: 7.4 LV IVS:        0.71 cm LVOT diam:     1.90 cm LV SV:         38 ml LV SV Index:   22.62 LVOT Area:     2.84 cm  RIGHT VENTRICLE RV Basal diam:  2.91 cm LEFT ATRIUM             Index       RIGHT ATRIUM           Index LA diam:  2.80 cm 1.67 cm/m  RA Area:     23.20 cm LA Vol (A2C):   31.5 ml 18.79 ml/m RA Volume:   86.80 ml  51.79 ml/m LA Vol (A4C):   27.0 ml 16.11 ml/m LA Biplane Vol: 29.8 ml 17.78 ml/m  AORTIC VALVE                   PULMONIC VALVE AV Area (Vmax):    1.97 cm    PV Vmax:       0.75 m/s AV Area (Vmean):   1.94 cm    PV Vmean:      54.900 cm/s AV Area (VTI):     2.09 cm    PV VTI:        0.146 m AV Vmax:           131.00 cm/s PV Peak grad:  2.3 mmHg AV Vmean:          86.200 cm/s PV Mean grad:  1.0 mmHg AV VTI:            0.209 m AV Peak Grad:      6.9 mmHg AV Mean Grad:      4.0 mmHg LVOT Vmax:         90.90 cm/s LVOT Vmean:        59.100 cm/s LVOT VTI:          0.154 m LVOT/AV VTI ratio: 0.74  AORTA Ao Root diam: 2.70 cm MITRAL VALVE                        TRICUSPID VALVE MV Area (PHT): 3.91 cm             TR Peak grad:   64.6 mmHg MV Peak grad:  4.4 mmHg             TR Vmax:        421.00 cm/s MV Mean grad:  2.0 mmHg MV Vmax:       1.05 m/s             SHUNTS MV Vmean:      64.9 cm/s            Systemic VTI:  0.15 m MV VTI:        0.14 m               Systemic Diam: 1.90 cm MV PHT:        56.26 msec MV Decel Time: 194 msec MV E velocity: 49.00 cm/s 103 cm/s MV A velocity: 97.00 cm/s 70.3 cm/s MV E/A ratio:  0.51       1.5  Kathlyn Sacramento MD Electronically signed by Kathlyn Sacramento MD Signature Date/Time: 02/03/2019/1:36:07 PM    Final    ECHOCARDIOGRAM COMPLETE  Result Date: 01/20/2019   ECHOCARDIOGRAM REPORT   Patient Name:   ALBERTHA BEATTIE Date of Exam: 01/20/2019 Medical Rec #:  [id number]         Height:       66.0 in Accession #:    [id number]        Weight:       122.0 lb Date of Birth:  1950/11/21         BSA:          1.62 m  Patient Age:    11 years          BP:  116/71 mmHg Patient Gender: F                 HR:           91 bpm. Exam Location:  [facility] Procedure: 2D Echo, Cardiac Doppler and Color Doppler Indications:     Abnormal ECG 794.31  History:         Patient has no prior history of Echocardiogram examinations.                  Risk Factors:Hypertension and Diabetes.  Sonographer:     Sherrie Sport RDCS (AE) Referring Phys:  [id number] Victoriano Lain A THOMAS Diagnosing Phys: Serafina Royals MD IMPRESSIONS  1. Left ventricular ejection fraction, by visual estimation, is 55 to 60%. The left ventricle has normal function. There is no left ventricular hypertrophy.  2. Global right ventricle has moderately reduced systolic function.The right ventricular size is moderately enlarged. Mildly increased right ventricular wall thickness.  3. Left atrial size was normal.  4. Right atrial size was moderately dilated.  5. The mitral valve is normal in structure. Trace mitral valve regurgitation.  6. The tricuspid valve is normal in structure. Tricuspid valve regurgitation moderate.  7. The aortic valve is normal in structure. Aortic valve regurgitation is not visualized.  8. The pulmonic valve was grossly normal. Pulmonic valve regurgitation is trivial.  9. Moderately elevated pulmonary artery systolic pressure. 10. The tricuspid regurgitant velocity is 3.62 m/s, and with an assumed right atrial pressure of 10 mmHg, the estimated right ventricular systolic pressure is moderately elevated at 62.6 mmHg. FINDINGS  Left Ventricle: Left ventricular ejection fraction, by visual estimation, is 55 to 60%. The left ventricle has normal function. There is no left ventricular hypertrophy. Right Ventricle: The right ventricular size is moderately enlarged. Mildly increased right ventricular wall thickness. Global RV systolic function is has moderately reduced systolic function. The tricuspid regurgitant velocity is 3.62 m/s, and with an assumed right atrial  pressure of 10 mmHg, the estimated right ventricular systolic pressure is moderately elevated at 62.6 mmHg. Left Atrium: Left atrial size was normal in size. Right Atrium: Right atrial size was moderately dilated Pericardium: There is no evidence of pericardial effusion. Mitral Valve: The mitral valve is normal in structure. Trace mitral valve regurgitation. Tricuspid Valve: The tricuspid valve is normal in structure. Tricuspid valve regurgitation moderate. Aortic Valve: The aortic valve is normal in structure. Aortic valve regurgitation is not visualized. Aortic valve mean gradient measures 3.7 mmHg. Aortic valve peak gradient measures 5.2 mmHg. Aortic valve area, by VTI measures 1.65 cm. Pulmonic Valve: The pulmonic valve was grossly normal. Pulmonic valve regurgitation is trivial. Aorta: The aortic root and ascending aorta are structurally normal, with no evidence of dilitation. IAS/Shunts: No atrial level shunt detected by color flow Doppler.  LEFT VENTRICLE PLAX 2D LVIDd:         3.16 cm  Diastology LVIDs:         1.90 cm  LV e' lateral:   5.77 cm/s LV PW:         1.41 cm  LV E/e' lateral: 9.3 LV IVS:        1.11 cm  LV e' medial:    3.92 cm/s LVOT diam:     2.00 cm  LV E/e' medial:  13.6 LV SV:         29 ml LV SV Index:   17.85 LVOT Area:     3.14 cm  RIGHT VENTRICLE RV Basal diam:  4.44  cm RV S prime:     10.20 cm/s TAPSE (M-mode): 3.0 cm LEFT ATRIUM             Index       RIGHT ATRIUM           Index LA diam:        2.40 cm 1.48 cm/m  RA Area:     25.70 cm LA Vol (A2C):   21.1 ml 13.02 ml/m RA Volume:   96.00 ml  59.23 ml/m LA Vol (A4C):   9.6 ml  5.94 ml/m LA Biplane Vol: 14.6 ml 9.01 ml/m  AORTIC VALVE                   PULMONIC VALVE AV Area (Vmax):    1.61 cm    PV Vmax:        0.42 m/s AV Area (Vmean):   1.45 cm    PV Peak grad:   0.7 mmHg AV Area (VTI):     1.65 cm    RVOT Peak grad: 1 mmHg AV Vmax:           113.67 cm/s AV Vmean:          86.200 cm/s AV VTI:            0.186 m AV Peak  Grad:      5.2 mmHg AV Mean Grad:      3.7 mmHg LVOT Vmax:         58.40 cm/s LVOT Vmean:        39.900 cm/s LVOT VTI:          0.098 m LVOT/AV VTI ratio: 0.52  AORTA Ao Root diam: 2.90 cm MITRAL VALVE                        TRICUSPID VALVE MV Area (PHT): 6.32 cm             TR Peak grad:   52.6 mmHg MV PHT:        34.80 msec           TR Vmax:        364.00 cm/s MV Decel Time: 120 msec MV E velocity: 53.40 cm/s 103 cm/s  SHUNTS MV A velocity: 94.10 cm/s 70.3 cm/s Systemic VTI:  0.10 m MV E/A ratio:  0.57       1.5       Systemic Diam: 2.00 cm  Serafina Royals MD Electronically signed by Serafina Royals MD Signature Date/Time: 01/20/2019/4:41:26 PM    Final     PERFORMANCE STATUS (ECOG) : 4  Review of Systems Unable to complete  Physical Exam General: frail appearing Pulmonary: Unlabored Extremities: no edema, no joint deformities Skin: no rashes Neurological: Weakness, poorly responsive, nods head briefly but does not open eyes  IMPRESSION: Follow-up visit.  Patient continues to be poorly responsive.  She nods her head briefly to questions but does not open eyes.  Oral intake is minimal.  Dr. Rogue Bussing, Dr. Arbutus Ped, and I met with patient sister, [name].  Patient's nephew participated in the conversation via phone.  Family was updated on patient's current medical problems.  [name] verbalized an understanding that patient is unlikely to improve and may be approaching end-of-life.  She became very emotional but stated repeatedly that she wanted to focus on comfort and that she would not want life prolonging measures such as resuscitation or machines.  We discussed the option of residential hospice  and she became somewhat upset and left the meeting.  I met with her privately later and she again reiterated a desire to pursue comfort.  She recognizes that patient is likely not to be a rehab candidate nor could she take care of her at home.  Sister said she would be interested in pursuing  residential hospice.  She again verbalized agreement with DNR.  Case discussed with the hospice liaison.  PLAN: -Best supportive care -Would recommend residential hospice -DNR/DNI   Patient expressed understanding and was in agreement with this plan. She also understands that She can call the clinic at any time with any questions, concerns, or complaints.     Time Total: 45 minutes  Visit consisted of counseling and education dealing with the complex and emotionally intense issues of symptom management and palliative care in the setting of serious and potentially life-threatening illness.Greater than 50%  of this time was spent counseling and coordinating care related to the above assessment and plan.  Signed by: Altha Harm, PhD, NP-C

## 2019-02-12 NOTE — Progress Notes (Signed)
   02/12/19 1315  Clinical Encounter Type  Visited With Health care provider  Visit Type Initial  Referral From Palliative care team   Chaplain received a page/referral to support the patient's sister. Upon arrival, this chaplain talked with Mr. Altha Harm, NP who provided further details about the patient's situation and the request to support the family. The patient's sister had already left the patient's room. This chaplain asked the unit secretary to call or page the on-call chaplain if the patient's sister Apolonio Schneiders) returned in order to provide support. Chaplain services will continue to monitor and support as needed.

## 2019-02-12 NOTE — Progress Notes (Signed)
PT Cancellation Note  Patient Details Name: Audrey Chan MRN: [id number] DOB: 06-21-1950   Cancelled Treatment:    Reason Eval/Treat Not Completed: Other (comment).  Pt's nurse reports plan for family meeting at 12:30 today to determine POC.  Will hold PT at this time and re-attempt PT treatment session at a later date/time as deemed appropriate (pending outcome of family meeting).   [name], PT 02/12/19, 9:40 AM

## 2019-02-12 NOTE — Progress Notes (Signed)
New referral for [facility] received from [place] and Palliative NP [place]. Patient information faxed to referral. Pending approval from hospice medical director. Writer spoke with patient's sister [name] via telephone, we plan to meet at her sister's room tomorrow morning around 11 am. Hospital care team updated. Flo Shanks BSN RN, [place] [phone]

## 2019-02-12 NOTE — Progress Notes (Signed)
PT Cancellation Note  Patient Details Name: Audrey Chan MRN: [id number] DOB: 1950/07/01   Cancelled Treatment:    Reason Eval/Treat Not Completed: Other (comment).  Chart reviewed.  Pt resting in bed with her eyes open upon PT arrival; pt appearing alert and more talkative but often stating "yes" to many things (that were not meant for yes/no answers).  Pt agreeable to OOB mobility but pt did not initiate to assist with any attempts with OOB.  Pt then noted to be incontinent of bowel so deferred further attempts at [facility] mobility and nurse notified of pt's need for clean-up.  Will re-attempt PT treatment session at a later date/time.  [name], PT 02/12/19, 2:35 PM

## 2019-02-12 NOTE — Consult Note (Signed)
[place] CONSULT NOTE  Patient Care Team: Revelo, Elyse Jarvis, MD as PCP - General (Family Medicine) Flora Lipps, MD (Pulmonary Disease) [name], MD as Consulting Physician (Oncology)  CHIEF COMPLAINTS/PURPOSE OF CONSULTATION: Carcinoma of unknown primary  HISTORY OF PRESENTING ILLNESS:  Audrey Chan 68 y.o.  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  However patient was readmitted to hospital for mental status changes; MRI brain showed bilateral acute embolic clots.  Evaluated by neurology; continued on Eliquis.  Also had speech pathology evaluation.  Since admission to hospital patient's mentation has been waxing and waning-without any lucid intervals.  Patient has been minimally verbal.  Currently patient is resting in the bed.  No acute distress.   Review of Systems  Unable to perform ROS: Mental status change     MEDICAL HISTORY:  Past Medical History:  Diagnosis Date  . Allergic rhinitis   . Aneurysm of unspecified site ([place])   . Cholelithiasis   . Diabetes type 2, controlled ([place])   . GERD (gastroesophageal reflux disease)   . Hypertension   . Leg DVT (deep venous thromboembolism), acute, bilateral ([place]) 01/21/2019  . Multiple subsegmental pulmonary emboli without acute cor pulmonale ([place]) 01/20/2019  . Neuropathy   . Veno-occlusive disease     SURGICAL HISTORY: Past Surgical History:  Procedure Laterality Date  . ABDOMINAL HYSTERECTOMY  1972  . CERVICAL FUSION    . CHOLECYSTECTOMY N/A 12/28/2014   Procedure: LAPAROSCOPIC CHOLECYSTECTOMY;  Surgeon: Sherri Rad, MD;  Location: [facility] ORS;  Service: General;  Laterality: N/A;  . EYE SURGERY     detached retina  . MASS EXCISION Left 05/06/2018   Procedure: EXCISION OF LEFT  ANTECUBITAL CYST, DIABETIC;  Surgeon: Vickie Epley, MD;  Location: [facility] ORS;  Service: Vascular;  Laterality: Left;  . PULMONARY THROMBECTOMY Bilateral 01/22/2019   Procedure: PULMONARY THROMBECTOMY;  Surgeon: Algernon Huxley, MD;  Location: [facility];  Service: Cardiovascular;  Laterality: Bilateral;    SOCIAL HISTORY: Social History   Socioeconomic History  . Marital status: Widowed    Spouse name: Not on file  . Number of children: 1  . Years of education: Not on file  . Highest education level: Not on file  Occupational History  . Not on file  Tobacco Use  . Smoking status: Former Smoker    Quit date: 02/21/1999    Years since quitting: 19.9  . Smokeless tobacco: Never Used  Substance and Sexual Activity  . Alcohol use: No    Alcohol/week: 0.0 standard drinks  . Drug use: No  . Sexual activity: Not on file  Other Topics Concern  . Not on file  Social History Narrative  . Not on file   Social Determinants of Health   Financial Resource Strain:   . Difficulty of Paying Living Expenses: Not on file  Food Insecurity:   . Worried About Charity fundraiser in the Last Year: Not on file  . Ran Out of Food in the Last Year: Not on file  Transportation Needs:   . Lack of Transportation (Medical): Not on file  . Lack of Transportation (Non-Medical): Not on file  Physical Activity:   . Days of Exercise per Week: Not on file  . Minutes of Exercise per Session: Not on file  Stress:   .  Feeling of Stress : Not on file  Social Connections:   . Frequency of Communication with Friends and Family: Not on file  . Frequency of Social Gatherings with Friends and Family: Not on file  . Attends Religious Services: Not on file  . Active Member of Clubs or Organizations: Not on file  . Attends Archivist Meetings: Not on file  . Marital Status: Not on file  Intimate Partner Violence:   . Fear of Current or Ex-Partner: Not on file  . Emotionally Abused: Not on  file  . Physically Abused: Not on file  . Sexually Abused: Not on file    FAMILY HISTORY: Family History  Problem Relation Age of Onset  . Lung cancer Father     ALLERGIES:  has No Known Allergies.  MEDICATIONS:  Current Facility-Administered Medications  Medication Dose Route Frequency Provider Last Rate Last Admin  . 0.9 %  sodium chloride infusion   Intravenous Continuous Ezekiel Slocumb, DO 75 mL/hr at 02/12/19 2057 New Bag at 02/12/19 2057  . acetaminophen (TYLENOL) tablet 650 mg  650 mg Oral Q6H PRN Mansy, Jan A, MD       Or  . acetaminophen (TYLENOL) suppository 650 mg  650 mg Rectal Q6H PRN Mansy, Jan A, MD      . albuterol (PROVENTIL) (2.5 MG/3ML) 0.083% nebulizer solution 2.5 mg  2.5 mg Inhalation Q6H PRN Mansy, Jan A, MD      . apixaban (ELIQUIS) tablet 5 mg  5 mg Oral BID Mansy, Jan A, MD   5 mg at 02/12/19 [id number]  . aspirin EC tablet 81 mg  81 mg Oral Daily Mansy, Jan A, MD   81 mg at 02/12/19 0853  . cholecalciferol (VITAMIN D3) tablet 1,000 Units  1,000 Units Oral Daily Mansy, Arvella Merles, MD   1,000 Units at 02/12/19 [phone]  . ferrous sulfate tablet 325 mg  325 mg Oral BID WC Mansy, Jan A, MD   325 mg at 02/12/19 0853  . fluticasone (FLONASE) 50 MCG/ACT nasal spray 2 spray  2 spray Each Nare Daily PRN Mansy, Jan A, MD      . gabapentin (NEURONTIN) capsule 400 mg  400 mg Oral TID Mansy, Jan A, MD   400 mg at 02/12/19 0853  . insulin aspart (novoLOG) injection 0-9 Units  0-9 Units Subcutaneous TID WC Mansy, Arvella Merles, MD   1 Units at 02/12/19 1742  . insulin aspart (novoLOG) injection 2 Units  2 Units Subcutaneous TID WC Nicole Kindred A, DO   2 Units at 02/12/19 1742  . insulin glargine (LANTUS) injection 15 Units  15 Units Subcutaneous QHS Nicole Kindred A, DO   15 Units at 02/11/19 0040  . ipratropium-albuterol (DUONEB) 0.5-2.5 (3) MG/3ML nebulizer solution 3 mL  3 mL Nebulization Q6H PRN Mansy, Jan A, MD      . labetalol (NORMODYNE) injection 10 mg  10 mg Intravenous Q4H PRN  Nicole Kindred A, DO      . lactulose (CHRONULAC) 10 GM/15ML solution 30 g  30 g Oral BID Nicole Kindred A, DO   30 g at 02/12/19 1043  . lip balm (BLISTEX) ointment   Topical PRN Ezekiel Slocumb, DO      . loratadine (CLARITIN) tablet 10 mg  10 mg Oral Daily Mansy, Jan A, MD   10 mg at 02/12/19 [id number]  . LORazepam (ATIVAN) injection 1 mg  1 mg Intravenous Q1H PRN Mansy, Arvella Merles, MD      .  magnesium hydroxide (MILK OF MAGNESIA) suspension 30 mL  30 mL Oral Daily PRN Mansy, Jan A, MD      . metoprolol tartrate (LOPRESSOR) tablet 12.5 mg  12.5 mg Oral BID Mansy, Jan A, MD   12.5 mg at 02/12/19 0853  . multivitamin with minerals tablet 1 tablet  1 tablet Oral Daily Mansy, Jan A, MD   1 tablet at 02/12/19 0853  . polyethylene glycol (MIRALAX / GLYCOLAX) packet 17 g  17 g Oral Daily PRN Mansy, Jan A, MD      . rosuvastatin (CRESTOR) tablet 20 mg  20 mg Oral Daily Mansy, Jan A, MD   20 mg at 02/12/19 0852    PHYSICAL EXAMINATION:  Vitals:   02/12/19 1700 02/12/19 1931  BP: 140/86 131/77  Pulse: 97 (!) 102  Resp: 16 18  Temp: 97.8 F (36.6 C) 98.7 F (37.1 C)  SpO2: 94% 90%   Filed Weights   02/07/19 0313  Weight: 132 lb 15 oz (60.3 kg)    Physical Exam  Constitutional:  Cachectic appearing female patient resting in the bed.  No acute distress.  Accompanied by sister.  HENT:  Head: Normocephalic and atraumatic.  Mouth/Throat: Oropharynx is clear and moist. No oropharyngeal exudate.  Eyes: Pupils are equal, round, and reactive to light.  Cardiovascular: Normal rate and regular rhythm.  Pulmonary/Chest: No respiratory distress. She has no wheezes.  Decreased air entry bilaterally.  Abdominal: Soft. Bowel sounds are normal. She exhibits no distension and no mass. There is no abdominal tenderness. There is no rebound and no guarding.  Musculoskeletal:        General: No tenderness or edema. Normal range of motion.     Cervical back: Normal range of motion and neck supple.   Neurological: She is alert.  Oriented x0.  Skin: Skin is warm.  Psychiatric:  Flat affect     LABORATORY DATA:  I have reviewed the data as listed Lab Results  Component Value Date   WBC 10.3 02/11/2019   HGB 11.1 (L) 02/11/2019   HCT 33.7 (L) 02/11/2019   MCV 68.1 (L) 02/11/2019   PLT 295 02/11/2019   Recent Labs    12/31/18 0444 01/01/19 0452 01/22/19 1631 01/30/19 0823 02/02/19 1929 02/09/19 0515 02/10/19 0614 02/10/19 1618 02/11/19 0334  NA 139  --  137 138 135 135 136  --  138  K 3.1*  --  4.5 4.2 3.9 5.1 5.8* 4.7 4.8  CL 111  --  110 105 104 105 104  --  108  CO2 19*  --  19* 22 18* 20* 14*  --  17*  GLUCOSE 186*  --  221* 232* 238* 316* 349*  --  248*  BUN 17  --  17 15 17  26* 28*  --  30*  CREATININE 1.27*  --  1.33* 1.39* 1.24* 1.23* 1.37*  --  1.44*  CALCIUM 8.2*  --  8.5* 9.0 9.0 8.2* 8.6*  --  8.8*  GFRNONAA 43*  --  41* 39* 44* 45* 39*  --  37*  GFRAA 50*  --  47* 45* 51* 52* 45*  --  43*  PROT 6.4*   < > 6.6 8.0 7.4  --   --   --   --   ALBUMIN 3.0*   < > 2.9* 3.3* 3.2*  --   --   --   --   AST 81*   < > 311* 123* 130*  --   --   --   --  ALT 45*   < > 122* 113* 92*  --   --   --   --   ALKPHOS 203*   < > 1,059* 1,627* 1,819*  --   --   --   --   BILITOT 0.5   < > 0.9 0.6 1.0  --   --   --   --   BILIDIR <0.1  --   --   --   --   --   --   --   --   IBILI NOT CALCULATED  --   --   --   --   --   --   --   --    < > = values in this interval not displayed.    RADIOGRAPHIC STUDIES: I have personally reviewed the radiological images as listed and agreed with the findings in the report. EEG  Result Date: 02/07/2019 Alexis Goodell, MD     02/07/2019  3:58 PM ELECTROENCEPHALOGRAM REPORT Patient: TRACYANN WASSMUTH       Room #: [id number] EEG No. ID: [id number] Age: 68 y.o.        Sex: female Referring Physician: Mansy Report Date:  02/07/2019       Interpreting Physician: Alexis Goodell History: LETOYA VILLALVA is an 68 y.o. female with altered mental  status Medications: Eliquis, ASA, Neurontin, Insulin, Claritin. Lopressor, Crestor Conditions of Recording:  This is a 21 channel routine scalp EEG performed with bipolar and monopolar montages arranged in accordance to the international 10/20 system of electrode placement. One channel was dedicated to EKG recording. The patient is in the awake and drowsy states. Description:  The background activity is slow and poorly organized.  It is dominated by a low voltage theta activity that is diffusely distributed. Some intermittent underlying delta activity is noted at times as well.  No epileptiform activity is noted.  Hyperventilation was not performed.  Intermittent photic stimulation was performed but failed to illicit any change in the tracing. IMPRESSION: This is an abnormal EEG secondary to general background slowing.  This finding may be seen with a diffuse disturbance that is etiologically nonspecific, but may include a metabolic encephalopathy, among other possibilities.  No epileptiform activity was noted.  Alexis Goodell, MD Neurology [phone] 02/07/2019, 3:52 PM   CT ABDOMEN PELVIS WO CONTRAST  Result Date: 02/02/2019 CLINICAL DATA:  Abdominal distension. Recent diagnosis of pulmonary embolus. Multiple liver lesions post biopsy. EXAM: CT ABDOMEN AND PELVIS WITHOUT CONTRAST TECHNIQUE: Multidetector CT imaging of the abdomen and pelvis was performed following the standard protocol without IV contrast. COMPARISON:  Chest CTA earlier this day. Abdominal CT 2 weeks ago 01/20/2019 FINDINGS: Lower chest: Emphysema scattered subpleural nodules, assessed on chest CT earlier this day. Hepatobiliary: Heterogeneous liver parenchyma. Multiple liver lesions are better defined on prior contrast-enhanced exam. Difficult to assess for change given differences in technique. Postcholecystectomy similar proximal biliary prominence to prior. Motion artifact limits detailed assessment. Pancreas: Motion obscured. Coarse  parenchymal calcification in the pancreatic tail. Ductal dilatation in the tail measures up to 8 mm. Heterogeneous low-density in the pancreatic tail, series 2, image 16. Detailed parenchymal assessment is limited in the lack of contrast and motion. Spleen: Normal in size without focal abnormality. Adrenals/Urinary Tract: Bilateral adrenal thickening. No hydronephrosis. There is excreted IV contrast in both renal collecting systems from prior chest CTA. Excreted IV contrast in the urinary bladder. No bladder wall thickening. Stomach/Bowel: Bowel evaluation is limited in the absence of contrast and  patient motion. Also paucity of intra-abdominal fat. Stomach is nondistended. No small bowel obstruction or obvious inflammation. Normal appendix. Moderate stool burden throughout the colon. No colonic wall thickening or inflammation. Vascular/Lymphatic: Moderate aortic atherosclerosis. No aneurysm. Limited assessment for adenopathy in the absence of contrast and patient motion. Reproductive: Post hysterectomy per history. No evidence of adnexal mass. Other: Small amount of perihepatic free fluid which has slightly increased in size from prior exam. Fluid is slightly dense and likely represents combination of simple ascites and post biopsy hemorrhage. Small amount of blood dependently in the pelvis. Cannot assess for active extravasation in the absence of IV contrast, however volume of fluid would not suggest this. Musculoskeletal: Postsurgical change of the anterior abdominal wall. A focal bone abnormality. IMPRESSION: 1. Post recent liver biopsy. Small amount of high-density fluid adjacent to the liver and tracking in the pelvis likely post biopsy hemorrhage. Cannot assess for active extravasation in the absence of IV contrast, however volume of fluid does not suggest this. Multiple liver lesions are better characterized on prior contrast-enhanced exam. 2. Coarse parenchymal calcification in the pancreatic tail with  heterogeneous low-density and distal pancreatic ductal dilatation. In the setting of multiple hepatic lesions, findings are concerning for pancreatic neoplasm, however not well-defined on this noncontrast exam. Aortic Atherosclerosis (ICD10-I70.0). Electronically Signed   By: Keith Rake M.D.   On: 02/02/2019 23:20   CT Head Wo Contrast  Result Date: 02/07/2019 CLINICAL DATA:  Altered mental status. Recent diagnosis of liver metastasis. EXAM: CT HEAD WITHOUT CONTRAST TECHNIQUE: Contiguous axial images were obtained from the base of the skull through the vertex without intravenous contrast. COMPARISON:  Head CT 5 days ago 02/02/2019. Brain MRI 01/01/2019 FINDINGS: Brain: No intracranial hemorrhage, mass effect, or midline shift. Stable brain volume. No hydrocephalus. The basilar cisterns are patent. Unchanged chronic small vessel ischemia. Remote left cerebellar infarct. Small additional cerebellar infarct on MRI last month is not well seen by CT. No evidence of territorial infarct or acute ischemia. No extra-axial or intracranial fluid collection. Vascular: No hyperdense vessel. Skull: No fracture or focal lesion. Sinuses/Orbits: Paranasal sinuses and mastoid air cells are clear. The visualized orbits are unremarkable. Other: None. IMPRESSION: 1. No acute intracranial abnormality. 2. Unchanged atrophy and chronic ischemia. Electronically Signed   By: Keith Rake M.D.   On: 02/07/2019 03:54   CT Head Wo Contrast  Result Date: 02/02/2019 CLINICAL DATA:  Altered mental status EXAM: CT HEAD WITHOUT CONTRAST TECHNIQUE: Contiguous axial images were obtained from the base of the skull through the vertex without intravenous contrast. COMPARISON:  12/31/2018 FINDINGS: Brain: Chronic atrophic and ischemic changes are again seen and stable. No acute hemorrhage, acute infarction or space-occupying mass lesion are seen. Vascular: No hyperdense vessel or unexpected calcification. Skull: Normal. Negative for  fracture or focal lesion. Sinuses/Orbits: No acute finding. Other: None. IMPRESSION: Chronic atrophic and ischemic changes similar to that seen on the prior exam. No acute abnormality is noted. Electronically Signed   By: Inez Catalina M.D.   On: 02/02/2019 20:31   CT Angio Chest PE W/Cm &/Or Wo Cm  Result Date: 02/02/2019 CLINICAL DATA:  Shortness of breath EXAM: CT ANGIOGRAPHY CHEST WITH CONTRAST TECHNIQUE: Multidetector CT imaging of the chest was performed using the standard protocol during bolus administration of intravenous contrast. Multiplanar CT image reconstructions and MIPs were obtained to evaluate the vascular anatomy. CONTRAST:  75mL OMNIPAQUE IOHEXOL 350 MG/ML SOLN COMPARISON:  01/20/2019 FINDINGS: Cardiovascular: Thoracic aorta demonstrates atherosclerotic calcifications without aneurysmal dilatation or  dissection. The heart is at the upper limits of normal in size. The pulmonary artery is well visualized with a normal branching pattern. The previously seen pulmonary emboli are again identified bilaterally with some minimal improvement in the small central branches. No significant resolution of the more peripheral emboli is noted. No new significant emboli are noted. Changes of right heart strain are again noted and stable. Mediastinum/Nodes: The esophagus as visualized is within normal limits. No hilar or mediastinal adenopathy is seen. The thoracic inlet is within normal limits. Lungs/Pleura: Diffuse emphysematous changes are noted throughout both lungs. Scattered subpleural nodular changes are seen without significant increase from the prior study. No focal confluent infiltrate is seen. No sizable effusion is noted. No pneumothorax seen. Upper Abdomen: Visualized upper abdomen demonstrates mild perihepatic fluid stable from the prior exam. Stable hypodense lesions are noted throughout the liver consistent with the given clinical history of hepatic metastatic disease. Musculoskeletal:  Degenerative changes of the thoracic spine are noted. No acute bony abnormality is seen. Review of the MIP images confirms the above findings. IMPRESSION: Slight improvement in pulmonary emboli bilaterally although the peripheral emboli appear stable. No new emboli are seen. Persistent right heart strain. Emphysematous changes without acute infiltrate. Multiple pulmonary nodules stable from the prior exam which may represent some metastatic disease Hepatic metastatic disease. Aortic Atherosclerosis (ICD10-I70.0) and Emphysema (ICD10-J43.9). Electronically Signed   By: Inez Catalina M.D.   On: 02/02/2019 20:39   CT Angio Chest PE W/Cm &/Or Wo Cm  Result Date: 01/20/2019 CLINICAL DATA:  Suspected covid, elevated LFTs, recent stroke EXAM: CT ANGIOGRAPHY CHEST, abdomen, and pelvis WITH CONTRAST TECHNIQUE: Multidetector CT imaging of the chest, abdomen, and pelvis was performed using the standard protocol during bolus administration of intravenous contrast. Multiplanar CT image reconstructions and MIPs were obtained to evaluate the vascular anatomy. CONTRAST:  59mL OMNIPAQUE IOHEXOL 350 MG/ML SOLN COMPARISON:  CT chest September 13, 2017 FINDINGS: Cardiovascular: There is a optimal opacification of the pulmonary arteries. There is a small nonocclusive thrombus seen within the left main pulmonary artery which extends into the anterior left upper lobe segmental branches and partially occlusive thrombus in the posterior left lower lobe subsegmental branches. There is also partially occlusive thrombus seen within the posterior right upper lobe segmental branch, series 6, image 37. There is also partially occlusive thrombus seen extending into the right middle lobe subsegmental branches and posterior right lower lobe subsegmental branches. The heart is normal in size. No pericardial effusion or thickening. There is slight straightening of the interventricular septum which could be due to early right ventricular heart strain.  There is normal three-vessel brachiocephalic anatomy without proximal stenosis. Scattered atherosclerosis is noted. The thoracic aorta is normal in appearance. Mediastinum/Nodes: No hilar, mediastinal, or axillary adenopathy. Thyroid gland, trachea, and esophagus demonstrate no significant findings. Lungs/Pleura: Small subcentimeter pulmonary nodule are again identified as on prior exam dating back to 2019, for example in the posterior right upper lobe there is a 5 mm pulmonary nodule, series 8, image 21 and anterior right lower lobe, series 8, image 54 a 4 mm pulmonary nodule. There is however a new small nodular opacity in the posterior left upper lobe, series 8, image 47 measuring 7 mm not seen on prior exam. No large airspace consolidation or pleural effusion is seen. Centrilobular emphysematous changes seen within both lung apices. Musculoskeletal: No chest wall abnormality. No acute or significant osseous findings. Review of the MIP images confirms the above findings. Abdomen/pelvis: Hepatobiliary: There are multiple low-density  liver lesions seen throughout which were not seen on the prior exam. There is also heterogeneous enhancement seen throughout the liver parenchyma. Central periportal edema is noted. The portal vein is patent. The patient is status post cholecystectomy. A small amount of perihepatic ascites is noted. Pancreas: Unremarkable. No pancreatic ductal dilatation or surrounding inflammatory changes. Spleen: Normal in size without focal abnormality. Adrenals/Urinary Tract: Both adrenal glands appear normal. The kidneys and collecting system appear normal without evidence of urinary tract calculus or hydronephrosis. Bladder is unremarkable. Stomach/Bowel: The stomach, small bowel, and colon are normal in appearance. No inflammatory changes, wall thickening, or obstructive findings. Vascular/Lymphatic: There are no enlarged mesenteric, retroperitoneal, or pelvic lymph nodes. Scattered aortic  atherosclerotic calcifications are seen without aneurysmal dilatation. Reproductive: The uterus and adnexa are unremarkable. Other: No evidence of abdominal wall mass or hernia. Musculoskeletal: No acute or significant osseous findings. IMPRESSION: 1. Bilateral partially occlusive segmental and subsegmental pulmonary emboli as described above. 2. Findings which could be suggestive of early right ventricular heart strain. 3. Multiple unchanged subcentimeter pulmonary nodules as on prior exam dating back to 2019. 4. New nodular opacity within the posterior left upper lobe measuring 7 mm which is nonspecific, and would recommend attention on short interval follow-up exam. 5. Centrilobular emphysematous changes. 6. Numerous hepatic lesions throughout, likely suggestive of hepatic metastatic disease. 7. Small amount of perihepatic ascites 8. These results were called by telephone at the time of interpretation on 01/20/2019 at 2:07 am to provider [facility] , who verbally acknowledged these results. Electronically Signed   By: Prudencio Pair M.D.   On: 01/20/2019 02:09   MR BRAIN WO CONTRAST  Result Date: 02/07/2019 CLINICAL DATA:  TIA.  Renal failure and stage IV liver cancer. EXAM: MRI HEAD WITHOUT CONTRAST TECHNIQUE: Multiplanar, multiecho pulse sequences of the brain and surrounding structures were obtained without intravenous contrast. COMPARISON:  MRI head 01/01/2019.  CT head 02/07/2019 FINDINGS: Brain: Multiple small areas of acute infarct throughout both cerebellar hemispheres. Small areas of acute/subacute infarct in both cerebral hemispheres involving the frontal and parietal lobes. Extensive chronic microvascular ischemic change throughout the cerebral white matter bilaterally. Chronic infarcts in the cerebellum on the left. Small areas of hemorrhage in the right temporal and parietal lobe, not seen previously. Methemoglobin also noted in the parietal lobe bilaterally due to prior hemorrhage.  Methemoglobin also noted in the left cerebellum at site of prior infarct. Ventricle size normal.  No mass lesion or midline shift Vascular: Normal arterial flow voids. Skull and upper cervical spine: Negative. Cervical fusion hardware noted. Sinuses/Orbits: Negative Other: None IMPRESSION: Numerous small areas of acute or subacute infarct in both cerebral hemispheres and both cerebellar hemispheres. Findings are suggestive of emboli. Extensive chronic ischemic change in the white matter bilaterally. Chronic infarct left cerebellum. Scattered small areas of hemorrhage in the brain which appear to be subacute to chronic with evidence of methemoglobin several areas of hemorrhage. Electronically Signed   By: Franchot Gallo M.D.   On: 02/07/2019 12:08   CT abd/pelvis w/ IV contrast  Result Date: 01/20/2019 CLINICAL DATA:  Suspected covid, elevated LFTs, recent stroke EXAM: CT ANGIOGRAPHY CHEST, abdomen, and pelvis WITH CONTRAST TECHNIQUE: Multidetector CT imaging of the chest, abdomen, and pelvis was performed using the standard protocol during bolus administration of intravenous contrast. Multiplanar CT image reconstructions and MIPs were obtained to evaluate the vascular anatomy. CONTRAST:  68mL OMNIPAQUE IOHEXOL 350 MG/ML SOLN COMPARISON:  CT chest September 13, 2017 FINDINGS: Cardiovascular: There is a optimal  opacification of the pulmonary arteries. There is a small nonocclusive thrombus seen within the left main pulmonary artery which extends into the anterior left upper lobe segmental branches and partially occlusive thrombus in the posterior left lower lobe subsegmental branches. There is also partially occlusive thrombus seen within the posterior right upper lobe segmental branch, series 6, image 37. There is also partially occlusive thrombus seen extending into the right middle lobe subsegmental branches and posterior right lower lobe subsegmental branches. The heart is normal in size. No pericardial effusion  or thickening. There is slight straightening of the interventricular septum which could be due to early right ventricular heart strain. There is normal three-vessel brachiocephalic anatomy without proximal stenosis. Scattered atherosclerosis is noted. The thoracic aorta is normal in appearance. Mediastinum/Nodes: No hilar, mediastinal, or axillary adenopathy. Thyroid gland, trachea, and esophagus demonstrate no significant findings. Lungs/Pleura: Small subcentimeter pulmonary nodule are again identified as on prior exam dating back to 2019, for example in the posterior right upper lobe there is a 5 mm pulmonary nodule, series 8, image 21 and anterior right lower lobe, series 8, image 54 a 4 mm pulmonary nodule. There is however a new small nodular opacity in the posterior left upper lobe, series 8, image 47 measuring 7 mm not seen on prior exam. No large airspace consolidation or pleural effusion is seen. Centrilobular emphysematous changes seen within both lung apices. Musculoskeletal: No chest wall abnormality. No acute or significant osseous findings. Review of the MIP images confirms the above findings. Abdomen/pelvis: Hepatobiliary: There are multiple low-density liver lesions seen throughout which were not seen on the prior exam. There is also heterogeneous enhancement seen throughout the liver parenchyma. Central periportal edema is noted. The portal vein is patent. The patient is status post cholecystectomy. A small amount of perihepatic ascites is noted. Pancreas: Unremarkable. No pancreatic ductal dilatation or surrounding inflammatory changes. Spleen: Normal in size without focal abnormality. Adrenals/Urinary Tract: Both adrenal glands appear normal. The kidneys and collecting system appear normal without evidence of urinary tract calculus or hydronephrosis. Bladder is unremarkable. Stomach/Bowel: The stomach, small bowel, and colon are normal in appearance. No inflammatory changes, wall thickening, or  obstructive findings. Vascular/Lymphatic: There are no enlarged mesenteric, retroperitoneal, or pelvic lymph nodes. Scattered aortic atherosclerotic calcifications are seen without aneurysmal dilatation. Reproductive: The uterus and adnexa are unremarkable. Other: No evidence of abdominal wall mass or hernia. Musculoskeletal: No acute or significant osseous findings. IMPRESSION: 1. Bilateral partially occlusive segmental and subsegmental pulmonary emboli as described above. 2. Findings which could be suggestive of early right ventricular heart strain. 3. Multiple unchanged subcentimeter pulmonary nodules as on prior exam dating back to 2019. 4. New nodular opacity within the posterior left upper lobe measuring 7 mm which is nonspecific, and would recommend attention on short interval follow-up exam. 5. Centrilobular emphysematous changes. 6. Numerous hepatic lesions throughout, likely suggestive of hepatic metastatic disease. 7. Small amount of perihepatic ascites 8. These results were called by telephone at the time of interpretation on 01/20/2019 at 2:07 am to provider [facility] , who verbally acknowledged these results. Electronically Signed   By: Prudencio Pair M.D.   On: 01/20/2019 02:09   US Carotid Bilateral  Result Date: 02/07/2019 CLINICAL DATA:  68 year old female with a history of TIA EXAM: BILATERAL CAROTID DUPLEX ULTRASOUND TECHNIQUE: Pearline Cables scale imaging, color Doppler and duplex ultrasound were performed of bilateral carotid and vertebral arteries in the neck. COMPARISON:  None. FINDINGS: Criteria: Quantification of carotid stenosis is based on velocity parameters  that correlate the residual internal carotid diameter with NASCET-based stenosis levels, using the diameter of the distal internal carotid lumen as the denominator for stenosis measurement. The following velocity measurements were obtained: RIGHT ICA:  Systolic 94 cm/sec, Diastolic 28 cm/sec CCA:  43 cm/sec SYSTOLIC ICA/CCA RATIO:  2.2  ECA:  43 cm/sec LEFT ICA:  Systolic 88 cm/sec, Diastolic 29 cm/sec CCA:  42 cm/sec SYSTOLIC ICA/CCA RATIO:  2.1 ECA:  34 cm/sec Right Brachial SBP: Not acquired Left Brachial SBP: Not acquired RIGHT CAROTID ARTERY: No significant calcified disease of the right common carotid artery. Intermediate waveform maintained. Heterogeneous plaque without significant calcifications at the right carotid bifurcation. Low resistance waveform of the right ICA. No significant tortuosity. RIGHT VERTEBRAL ARTERY: Antegrade flow with low resistance waveform. LEFT CAROTID ARTERY: No significant calcified disease of the left common carotid artery. Intermediate waveform maintained. Heterogeneous plaque at the left carotid bifurcation without significant calcifications. Low resistance waveform of the left ICA. LEFT VERTEBRAL ARTERY:  Antegrade flow with low resistance waveform. Additional: Incidental bilateral thyroid nodules. IMPRESSION: Mild heterogeneous plaque at the bilateral carotid bifurcation, with discordant results regarding degree of stenosis by established duplex criteria. Peak velocity suggests no significant stenosis, with the ICA/ CCA ratio suggesting a greater degree of stenosis, bilaterally. If establishing a more accurate degree of stenosis is required, cerebral angiogram should be considered, or as a second best test, CTA. Bilateral thyroid nodules. Referral for a dedicated thyroid ultrasound may be considered. Signed, Dulcy Fanny. Dellia Nims, RPVI [facility] Radiology Electronically Signed   By: Corrie Mckusick D.O.   On: 02/07/2019 12:18   PERIPHERAL VASCULAR CATHETERIZATION  Result Date: 01/22/2019 See op note  US Venous Img Lower Bilateral (DVT)  Result Date: 01/20/2019 CLINICAL DATA:  History of pulmonary embolism, now with lower extremity pain. Evaluate for DVT. EXAM: BILATERAL LOWER EXTREMITY VENOUS DOPPLER ULTRASOUND TECHNIQUE: Gray-scale sonography with  graded compression, as well as color Doppler and duplex ultrasound were performed to evaluate the lower extremity deep venous systems from the level of the common femoral vein and including the common femoral, femoral, profunda femoral, popliteal and calf veins including the posterior tibial, peroneal and gastrocnemius veins when visible. The superficial great saphenous vein was also interrogated. Spectral Doppler was utilized to evaluate flow at rest and with distal augmentation maneuvers in the common femoral, femoral and popliteal veins. COMPARISON:  Chest CTA-01/20/2019 FINDINGS: RIGHT LOWER EXTREMITY Common Femoral Vein: No evidence of thrombus. Normal compressibility, respiratory phasicity and response to augmentation. Saphenofemoral Junction: No evidence of thrombus. Normal compressibility and flow on color Doppler imaging. Profunda Femoral Vein: No evidence of thrombus. Normal compressibility and flow on color Doppler imaging. Femoral Vein: No evidence of thrombus. Normal compressibility, respiratory phasicity and response to augmentation. Popliteal Vein: No evidence of thrombus. Normal compressibility, respiratory phasicity and response to augmentation. Calf Veins: There is hypoechoic occlusive thrombus within both paired right peroneal veins (images 58 and 59). Both paired right posterior tibial veins appear patent where imaged. Superficial Great Saphenous Vein: No evidence of thrombus. Normal compressibility. Other Findings:  None. _________________________________________________________ LEFT LOWER EXTREMITY Common Femoral Vein: No evidence of thrombus. Normal compressibility, respiratory phasicity and response to augmentation. Saphenofemoral Junction: No evidence of thrombus. Normal compressibility and flow on color Doppler imaging. Profunda Femoral Vein: No evidence of thrombus. Normal compressibility and flow on color Doppler imaging. Femoral Vein: No evidence of thrombus. Normal compressibility,  respiratory phasicity and response to augmentation. Popliteal Vein: No evidence of thrombus. Normal  compressibility, respiratory phasicity and response to augmentation. Calf Veins: There is hypoechoic occlusive thrombus within 1 of the paired left peroneal veins (images 29 and 30). The adjacent paired left peroneal vein as well though both paired posterior tibial veins appear patent. Superficial Great Saphenous Vein: No evidence of thrombus. Normal compressibility. Other Findings:  None. IMPRESSION: Examination is positive for occlusive DVT involving both paired right peroneal veins as well as one of two paired left peroneal veins. There is no extension of this distal tibial DVT to the more proximal venous system of either lower extremity. Electronically Signed   By: [name] Mariscal M.D.   On: 01/20/2019 17:02   CT BIOPSY  Result Date: 01/27/2019 INDICATION: Multiple liver lesions. EXAM: CT directed liver biopsy. MEDICATIONS: None. ANESTHESIA/SEDATION: Moderate (conscious) sedation was employed during this procedure. A total of Versed 2 mg and Fentanyl 25 mcg was administered intravenously. Moderate Sedation Time: 14 minutes. The patient's level of consciousness and vital signs were monitored continuously by radiology nursing throughout the procedure under my direct supervision. FLUOROSCOPY TIME:  Fluoroscopy Time: 0 minutes 0 seconds (0 mGy). COMPLICATIONS: None immediate. PROCEDURE: After discussing the risks and benefits of this procedure the patient informed consent was obtained. Standard time-out procedure was performed. Patient was sterilely prepped and draped. Following local anesthesia with 1% lidocaine and IV conscious sedation CT-directed liver lesion core biopsy was obtained with 2 good 23 mm 18 gauge core biopsy samples obtained. Sample sent to pathology. There no complications. IMPRESSION: Successful liver lesion CT-directed biopsy. Electronically Signed   By: Marcello Moores  Register   On: 01/27/2019 11:47    DG Chest Portable 1 View  Result Date: 02/07/2019 CLINICAL DATA:  Hypoxia. EXAM: PORTABLE CHEST 1 VIEW COMPARISON:  Chest radiograph and CTA 5 days ago 02/02/2019 FINDINGS: Lower lung volumes from prior exam. Emphysema. Small pulmonary nodules on prior CT are not well visualized radiographically. No pulmonary edema, focal airspace disease, pleural effusion or pneumothorax. No acute osseous abnormalities are seen. IMPRESSION: 1. Emphysema without acute abnormality. 2. Small pulmonary nodules on prior CT are not well visualized radiographically. Electronically Signed   By: Keith Rake M.D.   On: 02/07/2019 03:44   DG Chest Portable 1 View  Result Date: 02/02/2019 CLINICAL DATA:  Shortness of breath, stomach pain EXAM: PORTABLE CHEST 1 VIEW COMPARISON:  01/23/2019 FINDINGS: The heart size and mediastinal contours are within normal limits. Emphysema. The visualized skeletal structures are unremarkable. IMPRESSION: Emphysema without acute abnormality of the lungs in AP portable projection. Electronically Signed   By: Eddie Candle M.D.   On: 02/02/2019 19:06   DG Chest Port 1 View  Result Date: 01/23/2019 CLINICAL DATA:  Hypertension, pulmonary emboli EXAM: PORTABLE CHEST 1 VIEW COMPARISON:  01/20/2019, 01/19/2019 FINDINGS: The heart size and mediastinal contours are stable. No new focal airspace consolidation, pleural effusion, or pneumothorax. Lungs are mildly hyperexpanded. The visualized skeletal structures are unremarkable. IMPRESSION: Stable appearance of the chest without new airspace opacity. Electronically Signed   By: Davina Poke M.D.   On: 01/23/2019 12:54   DG Chest Portable 1 View  Result Date: 01/19/2019 CLINICAL DATA:  Shortness of breath EXAM: PORTABLE CHEST 1 VIEW COMPARISON:  12/31/2018 FINDINGS: Cardiomegaly. Lungs clear. No effusions or edema. No acute bony abnormality. IMPRESSION: Cardiomegaly.  No active disease. Electronically Signed   By: Rolm Baptise M.D.   On:  01/19/2019 23:33   ECHOCARDIOGRAM COMPLETE  Result Date: 02/03/2019   ECHOCARDIOGRAM REPORT   Patient Name:   NANSI ELLIFRITZ  Date of Exam: 02/03/2019 Medical Rec #:  [id number]         Height:       66.0 in Accession #:    [id number]        Weight:       132.0 lb Date of Birth:  1950/07/29         BSA:          1.68 m Patient Age:    45 years          BP:           145/86 mmHg Patient Gender: F                 HR:           95 bpm. Exam Location:  [facility] Procedure: 2D Echo, Cardiac Doppler and Color Doppler Indications:     I50.31 CHF-Acute Diastolic  History:         Patient has prior history of Echocardiogram examinations, most                  recent 01/20/2019. Risk Factors:Hypertension and Diabetes.  Sonographer:     Charmayne Sheer RDCS (AE) Referring Phys:  [id number] Athena Masse Diagnosing Phys: Kathlyn Sacramento MD  Sonographer Comments: Suboptimal subcostal window. IMPRESSIONS  1. Left ventricular ejection fraction, by visual estimation, is 55 to 60%. The left ventricle has normal function. There is mildly increased left ventricular hypertrophy.  2. Left ventricular diastolic parameters are consistent with Grade I diastolic dysfunction (impaired relaxation).  3. Right ventricular pressure overload.  4. The left ventricle has no regional wall motion abnormalities.  5. Global right ventricle has mildly reduced systolic function.The right ventricular size is moderately enlarged. No increase in right ventricular wall thickness.  6. Left atrial size was normal.  7. Right atrial size was moderately dilated.  8. The mitral valve is normal in structure. No evidence of mitral valve regurgitation. No evidence of mitral stenosis.  9. The tricuspid valve is normal in structure. Tricuspid valve regurgitation moderate. 10. The aortic valve is normal in structure. Aortic valve regurgitation is not visualized. Mild to moderate aortic valve sclerosis/calcification without any evidence of aortic stenosis. 11. The pulmonic  valve was normal in structure. Pulmonic valve regurgitation is not visualized. 12. Moderately to severely elevated pulmonary artery systolic pressure. 13. The tricuspid regurgitant velocity is 4.02 m/s, and with an assumed right atrial pressure of 5 mmHg, the estimated right ventricular systolic pressure is moderately elevated at 69.6 mmHg. FINDINGS  Left Ventricle: Left ventricular ejection fraction, by visual estimation, is 55 to 60%. The left ventricle has normal function. The left ventricle has no regional wall motion abnormalities. There is mildly increased left ventricular hypertrophy. The interventricular septum is flattened in systole, consistent with right ventricular pressure overload. Left ventricular diastolic parameters are consistent with Grade I diastolic dysfunction (impaired relaxation). Normal left atrial pressure. Right Ventricle: The right ventricular size is moderately enlarged. No increase in right ventricular wall thickness. Global RV systolic function is has mildly reduced systolic function. The tricuspid regurgitant velocity is 4.02 m/s, and with an assumed right atrial pressure of 5 mmHg, the estimated right ventricular systolic pressure is moderately elevated at 69.6 mmHg. Left Atrium: Left atrial size was normal in size. Right Atrium: Right atrial size was moderately dilated Pericardium: There is no evidence of pericardial effusion. Mitral Valve: The mitral valve is normal in structure. No evidence of mitral valve regurgitation. No evidence of mitral valve  stenosis by observation. MV peak gradient, 4.4 mmHg. Tricuspid Valve: The tricuspid valve is normal in structure. Tricuspid valve regurgitation moderate. Aortic Valve: The aortic valve is normal in structure. Aortic valve regurgitation is not visualized. Mild to moderate aortic valve sclerosis/calcification is present, without any evidence of aortic stenosis. Aortic valve mean gradient measures 4.0 mmHg. Aortic valve peak gradient  measures 6.9 mmHg. Aortic valve area, by VTI measures 2.09 cm. Pulmonic Valve: The pulmonic valve was normal in structure. Pulmonic valve regurgitation is not visualized. Pulmonic regurgitation is not visualized. Aorta: The aortic root, ascending aorta and aortic arch are all structurally normal, with no evidence of dilitation or obstruction. IAS/Shunts: No atrial level shunt detected by color flow Doppler. There is no evidence of a patent foramen ovale. No ventricular septal defect is seen or detected. There is no evidence of an atrial septal defect.  LEFT VENTRICLE PLAX 2D LVIDd:         3.70 cm  Diastology LVIDs:         2.41 cm  LV e' lateral:   6.64 cm/s LV PW:         0.78 cm  LV E/e' lateral: 7.4 LV IVS:        0.71 cm LVOT diam:     1.90 cm LV SV:         38 ml LV SV Index:   22.62 LVOT Area:     2.84 cm  RIGHT VENTRICLE RV Basal diam:  2.91 cm LEFT ATRIUM             Index       RIGHT ATRIUM           Index LA diam:        2.80 cm 1.67 cm/m  RA Area:     23.20 cm LA Vol (A2C):   31.5 ml 18.79 ml/m RA Volume:   86.80 ml  51.79 ml/m LA Vol (A4C):   27.0 ml 16.11 ml/m LA Biplane Vol: 29.8 ml 17.78 ml/m  AORTIC VALVE                   PULMONIC VALVE AV Area (Vmax):    1.97 cm    PV Vmax:       0.75 m/s AV Area (Vmean):   1.94 cm    PV Vmean:      54.900 cm/s AV Area (VTI):     2.09 cm    PV VTI:        0.146 m AV Vmax:           131.00 cm/s PV Peak grad:  2.3 mmHg AV Vmean:          86.200 cm/s PV Mean grad:  1.0 mmHg AV VTI:            0.209 m AV Peak Grad:      6.9 mmHg AV Mean Grad:      4.0 mmHg LVOT Vmax:         90.90 cm/s LVOT Vmean:        59.100 cm/s LVOT VTI:          0.154 m LVOT/AV VTI ratio: 0.74  AORTA Ao Root diam: 2.70 cm MITRAL VALVE                        TRICUSPID VALVE MV Area (PHT): 3.91 cm             TR Peak  grad:   64.6 mmHg MV Peak grad:  4.4 mmHg             TR Vmax:        421.00 cm/s MV Mean grad:  2.0 mmHg MV Vmax:       1.05 m/s             SHUNTS MV Vmean:      64.9  cm/s            Systemic VTI:  0.15 m MV VTI:        0.14 m               Systemic Diam: 1.90 cm MV PHT:        56.26 msec MV Decel Time: 194 msec MV E velocity: 49.00 cm/s 103 cm/s MV A velocity: 97.00 cm/s 70.3 cm/s MV E/A ratio:  0.51       1.5  Kathlyn Sacramento MD Electronically signed by Kathlyn Sacramento MD Signature Date/Time: 02/03/2019/1:36:07 PM    Final    ECHOCARDIOGRAM COMPLETE  Result Date: 01/20/2019   ECHOCARDIOGRAM REPORT   Patient Name:   MERRILY KLAMMER Date of Exam: 01/20/2019 Medical Rec #:  [id number]         Height:       66.0 in Accession #:    [id number]        Weight:       122.0 lb Date of Birth:  1950-10-28         BSA:          1.62 m Patient Age:    41 years          BP:           116/71 mmHg Patient Gender: F                 HR:           91 bpm. Exam Location:  [facility] Procedure: 2D Echo, Cardiac Doppler and Color Doppler Indications:     Abnormal ECG 794.31  History:         Patient has no prior history of Echocardiogram examinations.                  Risk Factors:Hypertension and Diabetes.  Sonographer:     Sherrie Sport RDCS (AE) Referring Phys:  [id number] Victoriano Lain A THOMAS Diagnosing Phys: Serafina Royals MD IMPRESSIONS  1. Left ventricular ejection fraction, by visual estimation, is 55 to 60%. The left ventricle has normal function. There is no left ventricular hypertrophy.  2. Global right ventricle has moderately reduced systolic function.The right ventricular size is moderately enlarged. Mildly increased right ventricular wall thickness.  3. Left atrial size was normal.  4. Right atrial size was moderately dilated.  5. The mitral valve is normal in structure. Trace mitral valve regurgitation.  6. The tricuspid valve is normal in structure. Tricuspid valve regurgitation moderate.  7. The aortic valve is normal in structure. Aortic valve regurgitation is not visualized.  8. The pulmonic valve was grossly normal. Pulmonic valve regurgitation is trivial.  9. Moderately elevated pulmonary  artery systolic pressure. 10. The tricuspid regurgitant velocity is 3.62 m/s, and with an assumed right atrial pressure of 10 mmHg, the estimated right ventricular systolic pressure is moderately elevated at 62.6 mmHg. FINDINGS  Left Ventricle: Left ventricular ejection fraction, by visual estimation, is 55 to 60%. The left ventricle has normal function. There is no left ventricular hypertrophy. Right Ventricle:  The right ventricular size is moderately enlarged. Mildly increased right ventricular wall thickness. Global RV systolic function is has moderately reduced systolic function. The tricuspid regurgitant velocity is 3.62 m/s, and with an assumed right atrial pressure of 10 mmHg, the estimated right ventricular systolic pressure is moderately elevated at 62.6 mmHg. Left Atrium: Left atrial size was normal in size. Right Atrium: Right atrial size was moderately dilated Pericardium: There is no evidence of pericardial effusion. Mitral Valve: The mitral valve is normal in structure. Trace mitral valve regurgitation. Tricuspid Valve: The tricuspid valve is normal in structure. Tricuspid valve regurgitation moderate. Aortic Valve: The aortic valve is normal in structure. Aortic valve regurgitation is not visualized. Aortic valve mean gradient measures 3.7 mmHg. Aortic valve peak gradient measures 5.2 mmHg. Aortic valve area, by VTI measures 1.65 cm. Pulmonic Valve: The pulmonic valve was grossly normal. Pulmonic valve regurgitation is trivial. Aorta: The aortic root and ascending aorta are structurally normal, with no evidence of dilitation. IAS/Shunts: No atrial level shunt detected by color flow Doppler.  LEFT VENTRICLE PLAX 2D LVIDd:         3.16 cm  Diastology LVIDs:         1.90 cm  LV e' lateral:   5.77 cm/s LV PW:         1.41 cm  LV E/e' lateral: 9.3 LV IVS:        1.11 cm  LV e' medial:    3.92 cm/s LVOT diam:     2.00 cm  LV E/e' medial:  13.6 LV SV:         29 ml LV SV Index:   17.85 LVOT Area:     3.14  cm  RIGHT VENTRICLE RV Basal diam:  4.44 cm RV S prime:     10.20 cm/s TAPSE (M-mode): 3.0 cm LEFT ATRIUM             Index       RIGHT ATRIUM           Index LA diam:        2.40 cm 1.48 cm/m  RA Area:     25.70 cm LA Vol (A2C):   21.1 ml 13.02 ml/m RA Volume:   96.00 ml  59.23 ml/m LA Vol (A4C):   9.6 ml  5.94 ml/m LA Biplane Vol: 14.6 ml 9.01 ml/m  AORTIC VALVE                   PULMONIC VALVE AV Area (Vmax):    1.61 cm    PV Vmax:        0.42 m/s AV Area (Vmean):   1.45 cm    PV Peak grad:   0.7 mmHg AV Area (VTI):     1.65 cm    RVOT Peak grad: 1 mmHg AV Vmax:           113.67 cm/s AV Vmean:          86.200 cm/s AV VTI:            0.186 m AV Peak Grad:      5.2 mmHg AV Mean Grad:      3.7 mmHg LVOT Vmax:         58.40 cm/s LVOT Vmean:        39.900 cm/s LVOT VTI:          0.098 m LVOT/AV VTI ratio: 0.52  AORTA Ao Root diam: 2.90 cm MITRAL VALVE  TRICUSPID VALVE MV Area (PHT): 6.32 cm             TR Peak grad:   52.6 mmHg MV PHT:        34.80 msec           TR Vmax:        364.00 cm/s MV Decel Time: 120 msec MV E velocity: 53.40 cm/s 103 cm/s  SHUNTS MV A velocity: 94.10 cm/s 70.3 cm/s Systemic VTI:  0.10 m MV E/A ratio:  0.57       1.5       Systemic Diam: 2.00 cm  Serafina Royals MD Electronically signed by Serafina Royals MD Signature Date/Time: 01/20/2019/4:41:26 PM    Final     Liver metastases [facility]) # 68 year old female patient with a 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  Carcinoma of unknown primary-  CEA/CA 27-29/ca-15-3-elevated; CA 19-9 normal. NGS testing results.  Currently s/p weekly dose of carbotaxol 12/16.  No clinical response noted.  In fact-noticed clinical deterioration/see discussion below  # Bilateral PE-right heart strain-currently on Eliquis.    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  # CKD- stage III/diabetes-brittle.  #Prognosis/management recommendations: I do long discussion with the patient's sister-[place] regarding patient's aggressive malignancy/clinical progression of disease; complicated by multiple medical problems including acute stroke/bilateral PE.  Patient currently has performance status of 4; minimally verbal.  Discussed with the patient that given the aggressive malignancy and significantly compromised performance status/organ function-the risk of chemotherapy outweighs the benefits.  I would not recommend chemotherapy as this would likely hasten her death from complications.  I would recommend by supportive care/hospice.  Discussed that given her clinical situation-life expectancy in the order of few weeks.    #Patient sister-[place] obviously overwhelmed given the news.  However voiced repeatedly that she did not want her sister to suffer if more chemotherapy treatments would hasten her death.   Above plan of care was discussed with the palliative care nurse practitioner Billey Chang; also attending physician Dr. Arbutus Ped.  In agreement.  Thank you Dr. Arbutus Ped for allowing me to participate in the care of your pleasant patient. Please do not hesitate to contact me with questions or concerns in the interim.   All questions were answered. The patient knows to call the clinic with any problems, questions or concerns.  [name], MD 02/12/2019 11:03 PM

## 2019-02-12 NOTE — Progress Notes (Signed)
OT Cancellation Note  Patient Details Name: Audrey Chan MRN: [id number] DOB: [birth year]   Cancelled Treatment:    Reason Eval/Treat Not Completed: Other (comment) This author reports to unit to attempt OT evaluation this date. Pt primary RN reports plan for family meeting at 12:30 today to determine POC.  Will hold OT at this time and re-attempt at a later date/time as deemed appropriate (pending outcome of family meeting).  Shara Blazing, M.S., OTR/L Ascom: [phone] 02/12/19, 9:43 AM   02/12/2019, 9:42 AM

## 2019-02-12 NOTE — Progress Notes (Addendum)
Speech Language Pathology Treatment: Dysphagia  Patient Details Name: Audrey Chan MRN: [id number] DOB: 17-Jun-1950 Today's Date: 02/12/2019 Time: 8502-7741 SLP Time Calculation (min) (ACUTE ONLY): 35 min  Assessment / Plan / Recommendation Clinical Impression  Pt continues to present w/ altered mental status, acute encephalopathy but is tolerating current dysphagia level 2/1 diet w/ thin liquids when fed by [place] Staff. Pt has a Actuary in room w/ her. She is primarily sleeping; eyes closed but does mumble when given tactile/verbal stim. Pt was sleeping but aroused w/ SLP's Mod cues to awaken and shift to her back for po's after initially laying on her side. Mod tactile/verbal cues given to engage pt's oral intake -- once she began, she opened mouth regularly for next sip/bite. No overt clinical s/s of aspiration w/ straw sips of thin liquid(10+) and trials of puree(10) -- No decline in phonations/vocal quality or respiratory effort during/post trials. Oral phase appeared grossly [facility] for timely bolus management and clearing of the thin liquids and purees given; no minced foods available. Thorough education given to [facility] staff to monitor pt during oral intake and for oral clearing. Pt's altered mental/Cognitive status is impacting pt's awareness during tasks of oral intake; she requires full feeding support -- these issues can increase risk for dysphagia/aspiration episodes. When following general aspiration precautions and monitoring pt during oral intake(providing cues as needed), pt's risk for aspiration is reduced. Unsure of pt's ability to maintain adequate nutrition/hydration in current state. Dietician consult and f/u recommended. Recommend continue Dys level 2(w/ added Purees in diet) and Thin liquids w/ aspiration precautions and feeding support/Supervision at meals. Recommend pills Crushed in puree for safer swallowing/clearing. No further skilled ST services indicated at this time. If  mental/Cognitive status improves, diet can be upgraded and ST services can f/u if indicated. May need cognitive and speech eval in future if status improves, however, at this time pt is not able to participate. Noted Palliative Care following for GOC. NSG updated. Thorough education provided to [place] staff assisting pt.      HPI  Pt is a 68 y.o. female with multiple medical problems including COPD, chronic respiratory failure on home O2 at 2 L, history of CVA without residual deficits, CKD 3, who was recently hospitalized 01/22/2019-01/25/2019 for acute DVT/PE and underwent thrombectomy.  She was incidentally found to have hepatic Metastases and underwent outpatient biopsy on 01/27/2019.  Pathology was consistent with high-grade Adenocarcinoma of unclear primary.   She was readmitted on 02/02/2019-02/06/2019 with acute on chronic hypoxic respiratory failure with suspected cor pulmonale due to recent PE.  Her hospitalization was complicated by confusion.  Patient was seen in consultation by medical oncology.  Family ultimately wanted to pursue systemic chemotherapy.  Patient was started on low-dose carbotaxol while in the hospital.  She was discharged home with home health.   Patient is now readmitted 02/07/2019 with altered mental status and hyperglycemia.  MRI of the brain on 12/18 revealed numerous areas of acute or subacute infarct in bilateral cerebral/cerebellar hemispheres with extensive chronic ischemic change, a chronic infarct in the left cerebellum, and scattered small areas of hemorrhage.  Patient has had persistently poor mentation.  Palliative care was consulted to help address goals.       SLP Plan  All goals met(in light of pt's current mental/Cognitive status)       Recommendations  Diet recommendations: Dysphagia 2 (fine chop);Dysphagia 1 (puree);Thin liquid(added Purees) Liquids provided via: Cup;Straw(monitor) Medication Administration: Crushed with puree(for safer, easier  swallowing and  clearing) Supervision: Full supervision/cueing for compensatory strategies;Trained caregiver to feed patient Compensations: Minimize environmental distractions;Slow rate;Lingual sweep for clearance of pocketing;Small sips/bites;Multiple dry swallows after each bite/sip;Follow solids with liquid Postural Changes and/or Swallow Maneuvers: Seated upright 90 degrees;Upright 30-60 min after meal                General recommendations: (dietician f/u; Palliative Care f/u for [place]) Oral Care Recommendations: Oral care BID;Staff/trained caregiver to provide oral care;Oral care before and after PO Follow up Recommendations: (TBD - if presentation improves) SLP Visit Diagnosis: Dysphagia, oropharyngeal phase (R13.12)(declined Mental/Cognitive status) Plan: All goals met(in light of pt's current mental/Cognitive status)       GO                 Audrey Kenner, MS, CCC-SLP Sandrine Bloodsworth 02/12/2019, 11:51 AM

## 2019-02-12 NOTE — TOC Progression Note (Signed)
Transition of Care [facility]) - Progression Note    Patient Details  Name: Audrey Chan MRN: [id number] Date of Birth: 17-Dec-1950  Transition of [facility]) CM/SW Contact  Shelbie Hutching, RN Phone Number: 02/12/2019, 4:35 PM  Clinical Narrative:    Virginia agrees to residential hospice care and chooses [facility].  Flo Shanks with [facility] given residential hospice referral.     Expected Discharge Plan: [place] Barriers to Discharge: Continued Medical Work up  Expected Discharge Plan and Services Expected Discharge Plan: [place]   Discharge Planning Services: CM Consult Post Acute Care Choice: [place] Living arrangements for the past 2 months: Single Family Home                                       Social Determinants of Health (SDOH) Interventions    Readmission Risk Interventions Readmission Risk Prevention Plan 01/03/2019  Transportation Screening Complete  PCP or Specialist Appt within 5-7 Days Complete  Home Care Screening Complete  Medication Review (RN CM) Complete  Some recent data might be hidden

## 2019-02-12 NOTE — Assessment & Plan Note (Signed)
#   68 year old female patient with a 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  Carcinoma of unknown primary-  CEA/CA 27-29/ca-15-3-elevated; CA 19-9 normal. NGS testing results.  Currently s/p weekly dose of carbotaxol 12/16.  No clinical response noted.  In fact-noticed clinical deterioration/see discussion below  # Bilateral PE-right heart strain-currently on Eliquis.   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  # CKD- stage III/diabetes-brittle.  #Prognosis/management recommendations: I do long discussion with the patient's sister-[place] regarding patient's aggressive malignancy/clinical progression of disease; complicated by multiple medical problems including acute stroke/bilateral PE.  Patient currently has performance status of 4; minimally verbal.  Discussed with the patient that given the aggressive malignancy and significantly compromised performance status/organ function-the risk of chemotherapy outweighs the benefits.  I would not recommend chemotherapy as this would likely hasten her death from complications.  I would recommend by supportive care/hospice.  Discussed that given her clinical situation-life expectancy in the order of few weeks.    #Patient sister-[place] obviously overwhelmed given the news.  However voiced repeatedly that she did not want her sister to suffer if more chemotherapy treatments would hasten her death.   Above plan of care was discussed with the palliative care nurse practitioner Billey Chang; also attending physician Dr. Arbutus Ped.  In agreement.  Thank you Dr. Arbutus Ped for allowing me to participate in the care of your pleasant patient. Please do not hesitate to contact me with questions  or concerns in the interim.

## 2019-02-12 NOTE — Care Management Important Message (Signed)
Important Message  Patient Details  Name: Audrey Chan MRN: [id number] Date of Birth: 1951-01-21   Medicare Important Message Given:  Yes     Juliann Pulse A Donal Lynam 02/12/2019, 11:19 AM

## 2019-02-12 NOTE — Progress Notes (Addendum)
PROGRESS NOTE    Audrey Chan  [id number] DOB: October 23, 1950 DOA: 02/07/2019  PCP: Audrey Burrow, MD    LOS - 5   Brief Narrative:  68 y.o.femalewith a history of recent PE with subsequent acute on chronic hypoxic respiratory failure for which she was admitted here on 12/14 and discharged on 12/17, underwentpulmonary thrombectomy on 12/2 by Dr. Lucky Chan.Presented to the ED after her sister found her to be altered, acutely confused and nonsensical speech.She checked her blood glucose and it was elevated, but patient later reported thatshe hadbeen having hypoglycemia (40s to 45s) recently.Nowitnessed seizure-like activity.No loss of consciousness. In the ED, mildly hypertensive, afebrile, initially required non-rebreather at 100% oxygen, weaned to nasal cannula. Labs showed mild hyponatremia, bicarb 17, glucose 368, BUN 40, creatinine 1.82. Ammonia level 36. Initially potassium 6.5 but was hemolyzed sample, repeat normal. CBC showed leukocytosis of 13.5 with neutrophilia, and anemia.UAshowed more than 500 glucose andUDSnegative. Head CT scan revealed unchanged atrophy and chronic ischemia with no acute intracranial abnormalities.Patient was given Narcan in the ED without initial improvement, but eventually did appear back to baseline. Admitted for further evaluation and workup of altered mental status. Neurology consulted. Evaluated for possible seizure vs CVA/TIA. EEG showed no epileptiform activity. Echocardiogram shows EF of 55-60% with no cardiac source of emboli noted. Carotid dopplers inconclusive for significant stenosis. MRI brain showed scattered infarcts in multiple distributions, consistent with embolic strokes.Lipid panel notable for triglycerides 210.Patient has been on Eliquis foracutePE and she and her sister report compliance taking it. Patient has been intermittently confused and with mildly slurred speech at times.Evaluated by PT,  recommend SNF. SLP evaluated swallow and recommend dysphagia-2 diet at this time. Of note, this is patient's third episode of CVA this year, first in August, 2nd last month and this admission. Per her sister, prior to this admission, patient was independent with ambulation and most ADL's. Sister was helping with her medications and insulin. Sister confirms labile blood sugars have been an issue for quite some time.   Subjective 12/23: Patient a little more alert today, eyes were open when I entered the room, but she closed them once I started talking to her.  She denies pain or discomfort or feeling sick.  No acute events reported.  Assessment & Plan:   Principal Problem:   Embolic stroke [facility]) Active Problems:   Pulmonary embolism (HCC)   Acute encephalopathy   Labile blood glucose   Diabetic peripheral neuropathy associated with type 2 diabetes mellitus (HCC)   Insulin dependent type 2 diabetes mellitus ([place])   Liver metastases (HCC)   CKD (chronic kidney disease), stage III   Chronic respiratory failure with hypoxia ([place])  Goals of Care Discussion -  12/23: meeting to discuss goals of care.  This physician, oncologist Dr. Rogue Chan and Audrey Chan with palliative care team met with patient's sister, Audrey Chan, in person with her son, patient's nephew, on speaker phone.  Sister states not to put the patient through any suffering or aggressive measures or further tests.  States she wants her sister to be comfortable.  Hospice meeting with sister at 11am 12/24.     Embolic stroke, multifocal infarcts in several vascular distributions. No embolic source identified as of yet, see above narrative for workup to date.  --Neurology following --continue Eliquisand ASA --neuro checks --telemetry --glucose control, goal A1C < 7.0% --PT and OT recommending SNF  Acute encephalopathysecondary to above - improving  Chronic Hypoxic Respiratory Failure - patient on her baseline 2-3 L/min  supplemental  oxygen  Pulmonary embolism, recent, s/p thrombectomy, on Eliquis --stable respiratory status at this time --continue Eliquis  Labile Blood Glucosewith Hypoglycemic episodes, chronic per patient's sister Type 2 diabetes mellitus Now hyperglycemic, glucose > 300 --sensitivesliding scalecoverage --continue Novolog 3 units TID WC --continueLantus to15units tonight (usually on 25 units qHS) --CBG's and hypoglyemia protocol  Hyperkalemia - resolved.  K 4.8 this AM --gave IV Lasix yesterday, 12/21, for K 5.8  Dyslipidemia Continue statin   Peripheral neuropathy --continue gabapentin  Liver metastases, unknown primary. Incidental finding on recent imaging when patient admitted for acute PE. --further evaluation deferred due to acute problems above --poor prognosis, with unknown primary malignancy, would be stage 4, patient now with strokes and progressively weaker and altered, not a good candidate for chemotherapy.  AKI on CKD Stage IIIa - present on admission. AKI resolved. Baseline creatine appears to be around 1.2-1.3, was 1.82 on admission. Likely due to poor PO intake, prerenal azotemia.   DVT prophylaxis:Eliquis Code Status: Full Code Family Communication:none at bedside Disposition Plan:SNF pending bed available and neurology clearance for discharge.   Consultants:  Neurology  Oncology - Dr. Rogue Chan  Palliative Care  Procedures:  Echo  EEG  Antimicrobials:  None    Objective: Vitals:   02/12/19 0542 02/12/19 0616 02/12/19 0619 02/12/19 0746  BP: (!) 159/100 (!) 160/86  (!) 144/79  Pulse: (!) 103 (!) 101  (!) 103  Resp:  18  17  Temp: (!) 96.6 F (35.9 C)  (!) 97.5 F (36.4 C) 98.6 F (37 C)  TempSrc: Axillary  Oral   SpO2: 96% 94%  (!) 89%  Weight:      Height:        Intake/Output Summary (Last 24 hours) at 02/12/2019 1314 Last data filed at 02/12/2019 0900 Gross per 24 hour  Intake  1522.34 ml  Output 1400 ml  Net 122.34 ml   Filed Weights   02/07/19 0313  Weight: 60.3 kg    Examination:  General exam: awake, alert, no acute distress HEENT: dry mucus membranes, hearing grossly normal  Respiratory system: clear to auscultation bilaterally, no wheezes, rales or rhonchi, normal respiratory effort. Cardiovascular system: normal S1/S2, RRR, no JVD, murmurs, rubs, gallops, no pedal edema.   Gastrointestinal system: soft, non-tender, non-distended abdomen Central nervous system: oriented x1. no gross focal neurologic deficits, normal speech Extremities: moves all, no edema, normal tone Psychiatry: normal mood, congruent affect, judgement and insight appear normal    Data Reviewed: I have personally reviewed following labs and imaging studies  CBC: Recent Labs  Lab 02/07/19 0414 02/08/19 0454 02/11/19 0334  WBC 13.5* 10.3 10.3  NEUTROABS 12.1*  --   --   HGB 10.3* 10.4* 11.1*  HCT 33.1* 33.7* 33.7*  MCV 73.7* 73.3* 68.1*  PLT 391 331 295   Basic Metabolic Panel: Recent Labs  Lab 02/07/19 0414 02/07/19 0536 02/08/19 0454 02/09/19 0515 02/10/19 0614 02/10/19 1618 02/11/19 0334  NA 134*  --  141 135 136  --  138  K 6.5* 4.6 5.0 5.1 5.8* 4.7 4.8  CL 105  --  112* 105 104  --  108  CO2 17*  --  19* 20* 14*  --  17*  GLUCOSE 368*  --  72 316* 349*  --  248*  BUN 40*  --  31* 26* 28*  --  30*  CREATININE 1.82*  --  1.25* 1.23* 1.37*  --  1.44*  CALCIUM 8.1*  --  8.1* 8.2* 8.6*  --  8.8*  MG  --  1.9  --   --  1.9  --   --   PHOS  --  4.3  --   --   --   --   --    GFR: Estimated Creatinine Clearance: 34.5 mL/min (A) (by C-G formula based on SCr of 1.44 mg/dL (H)). Liver Function Tests: No results for input(s): AST, ALT, ALKPHOS, BILITOT, PROT, ALBUMIN in the last 168 hours. No results for input(s): LIPASE, AMYLASE in the last 168 hours. Recent Labs  Lab 02/07/19 0414 02/12/19 1019  AMMONIA 36* 21   Coagulation Profile: No results for  input(s): INR, PROTIME in the last 168 hours. Cardiac Enzymes: No results for input(s): CKTOTAL, CKMB, CKMBINDEX, TROPONINI in the last 168 hours. BNP (last 3 results) No results for input(s): PROBNP in the last 8760 hours. HbA1C: No results for input(s): HGBA1C in the last 72 hours. CBG: Recent Labs  Lab 02/11/19 1750 02/11/19 2017 02/12/19 0546 02/12/19 0745 02/12/19 1137  GLUCAP 154* 112* 136* 156* 225*   Lipid Profile: No results for input(s): CHOL, HDL, LDLCALC, TRIG, CHOLHDL, LDLDIRECT in the last 72 hours. Thyroid Function Tests: No results for input(s): TSH, T4TOTAL, FREET4, T3FREE, THYROIDAB in the last 72 hours. Anemia Panel: No results for input(s): VITAMINB12, FOLATE, FERRITIN, TIBC, IRON, RETICCTPCT in the last 72 hours. Sepsis Labs: No results for input(s): PROCALCITON, LATICACIDVEN in the last 168 hours.  Recent Results (from the past 240 hour(s))  SARS CORONAVIRUS 2 (TAT 6-24 HRS) Nasopharyngeal Nasopharyngeal Swab     Status: None   Collection Time: 02/02/19  9:30 PM   Specimen: Nasopharyngeal Swab  Result Value Ref Range Status   SARS Coronavirus 2 NEGATIVE NEGATIVE Final    Comment: (NOTE) SARS-CoV-2 target nucleic acids are NOT DETECTED. The SARS-CoV-2 RNA is generally detectable in upper and lower respiratory specimens during the acute phase of infection. Negative results do not preclude SARS-CoV-2 infection, do not rule out co-infections with other pathogens, and should not be used as the sole basis for treatment or other patient management decisions. Negative results must be combined with clinical observations, patient history, and epidemiological information. The expected result is Negative. Fact Sheet for Patients: SugarRoll.be Fact Sheet for Healthcare Providers: https://www.woods-mathews.com/ This test is not yet approved or cleared by the [place] FDA and  has been authorized for detection and/or  diagnosis of SARS-CoV-2 by FDA under an Emergency Use Authorization (EUA). This EUA will remain  in effect (meaning this test can be used) for the duration of the COVID-19 declaration under Section 56 4(b)(1) of the Act, 21 U.S.C. section 360bbb-3(b)(1), unless the authorization is terminated or revoked sooner. Performed at [facility], [place] [address], [place], [place] [address]   SARS CORONAVIRUS 2 (TAT 6-24 HRS) Nasopharyngeal Nasopharyngeal Swab     Status: None   Collection Time: 02/10/19  2:16 PM   Specimen: Nasopharyngeal Swab  Result Value Ref Range Status   SARS Coronavirus 2 NEGATIVE NEGATIVE Final    Comment: (NOTE) SARS-CoV-2 target nucleic acids are NOT DETECTED. The SARS-CoV-2 RNA is generally detectable in upper and lower respiratory specimens during the acute phase of infection. Negative results do not preclude SARS-CoV-2 infection, do not rule out co-infections with other pathogens, and should not be used as the sole basis for treatment or other patient management decisions. Negative results must be combined with clinical observations, patient history, and epidemiological information. The expected result is Negative. Fact Sheet for Patients: SugarRoll.be Fact  Sheet for Healthcare Providers: https://www.woods-mathews.com/ This test is not yet approved or cleared by the [place] FDA and  has been authorized for detection and/or diagnosis of SARS-CoV-2 by FDA under an Emergency Use Authorization (EUA). This EUA will remain  in effect (meaning this test can be used) for the duration of the COVID-19 declaration under Section 56 4(b)(1) of the Act, 21 U.S.C. section 360bbb-3(b)(1), unless the authorization is terminated or revoked sooner. Performed at [facility], [place] [address] [place], [place] [address]          Radiology Studies: No results found.      Scheduled Meds: . apixaban  5 mg Oral BID   . aspirin EC  81 mg Oral Daily  . cholecalciferol  1,000 Units Oral Daily  . ferrous sulfate  325 mg Oral BID WC  . gabapentin  400 mg Oral TID  . insulin aspart  0-9 Units Subcutaneous TID WC  . insulin aspart  2 Units Subcutaneous TID WC  . insulin glargine  15 Units Subcutaneous QHS  . lactulose  30 g Oral BID  . loratadine  10 mg Oral Daily  . metoprolol tartrate  12.5 mg Oral BID  . multivitamin with minerals  1 tablet Oral Daily  . rosuvastatin  20 mg Oral Daily   Continuous Infusions: . sodium chloride 75 mL/hr at 02/11/19 2355     LOS: 5 days    Time spent: 55-60 minutes    Ezekiel Slocumb, DO [facility]   If 7PM-7AM, please contact night-coverage [email or web address] Password [email or web address] 02/12/2019, 1:14 PM

## 2019-02-13 LAB — GLUCOSE, CAPILLARY
Glucose-Capillary: 301 mg/dL — ABNORMAL HIGH (ref 70–99)
Glucose-Capillary: 165 mg/dL — ABNORMAL HIGH (ref 70–99)

## 2019-02-13 MED ORDER — BLISTEX MEDICATED EX OINT: 1.0000 "application " | TOPICAL_OINTMENT | CUTANEOUS | Status: AC | PRN

## 2019-02-13 MED ORDER — LACTULOSE 10 GM/15ML PO SOLN: 30.0000 g | Freq: Two times a day (BID) | ORAL | 0 refills | Status: DC

## 2019-02-13 MED ORDER — INSULIN GLARGINE 100 UNIT/ML ~~LOC~~ SOLN: 15.0000 [IU] | Freq: Every day | SUBCUTANEOUS | 11 refills | Status: DC

## 2019-02-13 MED ORDER — ACETAMINOPHEN 650 MG RE SUPP: 650.0000 mg | Freq: Four times a day (QID) | RECTAL | 0 refills | Status: DC | PRN

## 2019-02-13 MED ORDER — POLYETHYLENE GLYCOL 3350 17 G PO PACK: 17.0000 g | PACK | Freq: Every day | ORAL | 0 refills | Status: DC | PRN

## 2019-02-13 MED ORDER — LACTULOSE 10 GM/15ML PO SOLN: 30.0000 g | Freq: Two times a day (BID) | ORAL | 0 refills | Status: AC

## 2019-02-13 MED ORDER — GABAPENTIN 400 MG PO CAPS: 400.0000 mg | ORAL_CAPSULE | Freq: Three times a day (TID) | ORAL | Status: AC

## 2019-02-13 MED ORDER — MAGNESIUM HYDROXIDE 400 MG/5ML PO SUSP: 30.0000 mL | Freq: Every day | ORAL | 0 refills | Status: DC | PRN

## 2019-02-13 NOTE — Progress Notes (Signed)
Physical Therapy Discharge Patient Details Name: Audrey Chan MRN: [id number] DOB: 01/27/1951 Today's Date: 02/13/2019 Time:  -     Patient discharged from PT services secondary pending transfer to Hospice home.  Will discharge at this time and complete orders.  GP     Chesley Noon 02/13/2019, 10:40 AM

## 2019-02-13 NOTE — Progress Notes (Signed)
After verifying patient's password this RN spoke with patient's sister, Nona Dell, regarding the patient's discharge plan. Donnelly Angelica was informed that patient will be transported via EMS to [facility] and that this RN will notify her once EMS has picked up the patient for transport. Donnelly Angelica was given the phone number and address to [place].

## 2019-02-13 NOTE — Progress Notes (Signed)
OT Cancellation Note  Patient Details Name: Audrey Chan MRN: [id number] DOB: 1951/01/11   Cancelled Treatment:    Reason Eval/Treat Not Completed: Other (comment). Per Case manager, pt is going to hospice home in the next day or so. Will sign off for therapy at this time. Please re-consult if pt becomes appropriate.   Jeni Salles, MPH, MS, OTR/L ascom [phone] 02/13/19, 10:35 AM

## 2019-02-13 NOTE — TOC Transition Note (Signed)
Transition of Care [facility]) - CM/SW Discharge Note   Patient Details  Name: Audrey Chan MRN: [id number] Date of Birth: 04-01-1950  [facility] CM/SW Contact:  Shelbie Hutching, RN Phone Number: 02/13/2019, 11:10 AM   Clinical Narrative:    Patient will discharge today to residential hospice at [facility] in [place].  Patient will transport via [employer].    Final next level of care: [place] Barriers to Discharge: Barriers Resolved   Patient Goals and CMS Choice   CMS Medicare.gov Compare Post Acute Care list provided to:: Patient Represenative (must comment)(sister [name]) Choice offered to / list presented to : Sibling  Discharge Placement              Patient [facility] bed at: [facility]) Patient to be transferred to facility by: [facility] Name of family member notified: [name] - sister Patient and family notified of of transfer: 02/13/19  Discharge Plan and Services   Discharge Planning Services: CM Consult Post Acute Care Choice: [place]                               Social Determinants of Health ([place]) Interventions     Readmission Risk Interventions Readmission Risk Prevention Plan 01/03/2019  Transportation Screening Complete  PCP or Specialist Appt within 5-7 Days Complete  Home Care Screening Complete  Medication Review (RN CM) Complete  Some recent data might be hidden

## 2019-02-13 NOTE — Discharge Summary (Signed)
Physician Discharge Summary  Audrey Chan KJZ:[id number] DOB: 1950-12-19 DOA: 02/07/2019  PCP: Audrey Burrow, MD  Admit date: 02/07/2019 Discharge date: 02/13/2019  Admitted From: Home Disposition:  Residential hospice  Recommendations for Outpatient Follow-up:  1. Follow up with hospice  Home Health: No  Equipment/Devices: None   Discharge Condition: Stable   CODE STATUS: DNR   Diet recommendation: Dysphgia 2, thin liquids   Brief/Interim Summary:  68 y.o.femalewith a history of recent PE with subsequent acute on chronic hypoxic respiratory failure for which she was admitted here on 12/14 and discharged on 12/17, underwentpulmonary thrombectomy on 12/2 by Dr. Lucky Chan.Presented to the ED after her sister found her to be altered, acutely confused and nonsensical speech.She checked her blood glucose and it was elevated, but patient later reported thatshe hadbeen having hypoglycemia (40s to 39s) recently.Nowitnessed seizure-like activity.No loss of consciousness. In the ED, mildly hypertensive, afebrile, initially required non-rebreather at 100% oxygen, weaned to nasal cannula. Labs showed mild hyponatremia, bicarb 17, glucose 368, BUN 40, creatinine 1.82. Ammonia level 36. Initially potassium 6.5 but was hemolyzed sample, repeat normal. CBC showed leukocytosis of 13.5 with neutrophilia, and anemia.UAshowed more than 500 glucose andUDSnegative. Head CT scan revealed unchanged atrophy and chronic ischemia with no acute intracranial abnormalities.Patient was given Narcan in the ED without initial improvement, but eventually did appear back to baseline. Admitted for further evaluation and workup of altered mental status. Neurology consulted. Evaluated for possible seizure vs CVA/TIA. EEG showed no epileptiform activity. Echocardiogram shows EF of 55-60% with no cardiac source of emboli noted. Carotid dopplers inconclusive for significant stenosis. MRI  brain showed scattered infarcts in multiple distributions, consistent with embolic strokes.Lipid panel notable for triglycerides 210.Patient has been on Eliquis foracutePE and she and her sister report compliance taking it. Evaluated by PT/OT, recommend SNF. SLP evaluated swallow and recommend dysphagia-2 diet with thin liquids at this time.   Patient remained mostly somnolent and persistently confused, oriented only to self throughout admission.  Goals of care discussion on 12/23 with oncologist, palliative care and this physician.  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   Discharging to residiential hospice today.   Discharge Diagnoses: Principal Problem:   Embolic stroke Audrey Chan) Active Problems:   Pulmonary embolism (HCC)   Acute encephalopathy   Labile blood glucose   Diabetic peripheral neuropathy associated with type 2 diabetes mellitus (HCC)   Insulin dependent type 2 diabetes mellitus (Audrey Chan)   Liver metastases (HCC)   CKD (chronic kidney disease), stage III   Chronic respiratory failure with hypoxia (Audrey Chan)  Goals of Care Discussion -  12/23: meeting to discuss goals of care.  This physician, oncologist Dr. Rogue Chan and Audrey Chan with palliative care team met with patient's sister, Audrey Chan, in person with her son, patient's nephew, on speaker phone.  Sister states not to put the patient through any suffering or aggressive measures or further tests.  States she wants her sister to be comfortable.  Hospice meeting with sister at 11am 12/24.     Embolic stroke, multifocal infarcts in several vascular distributions. No embolic source identified as of yet, see above narrative for workup to date.  --Neurology following --continue Eliquisand ASA --neuro checks --telemetry --glucose control, goal A1C < 7.0% --PTand OTrecommendingSNF  Acute  encephalopathysecondary to above - improves intermittently, but patient mostly somnolent and oriented to self.  Chronic Hypoxic Respiratory Failure - patient on her baseline  2-3 L/min supplemental oxygen  Pulmonary embolism, recent, s/p thrombectomy, on Eliquis --stable respiratory status at this time --stopping Eliquis, comfort care status  Labile Blood Glucosewith hypoglycemic episodes, chronic per patient's sister Type 2 diabetes mellitus Now hyperglycemic, glucose > 300 Insulin regimen during admission: sensitivesliding scale, Novolog 3 units TID WC, Lantus to15units qHS --CBG's and hypoglyemia protocol --no insulin on discharge, comfort care  Hyperkalemia -resolved.  Dyslipidemia - stopping statin   Peripheral neuropathy - will continue gabapentin for comfort  Liver metastases, unknown primary. Incidental finding on recent imaging when patient admitted for acute PE. --further evaluation deferred due to acute problems above --poor prognosis, with unknown primary malignancy, would be stage 4, patient now with strokes and progressively weaker and altered, not a good candidate for chemotherapy.  AKI on CKD Stage IIIa - present on admission. AKI resolved. Baseline creatine appears to be around 1.2-1.3, was 1.82 on admission. Likely due to poor PO intake, prerenal azotemia.  Discharge Instructions   Discharge Instructions    Call MD for:  severe uncontrolled pain   Complete by: As directed    Diet - low sodium heart healthy   Complete by: As directed    Increase activity slowly   Complete by: As directed      Allergies as of 02/13/2019   No Known Allergies     Medication List    STOP taking these medications   apixaban 5 MG Tabs tablet Commonly known as: ELIQUIS   cetirizine 10 MG tablet Commonly known as: ZYRTEC   Crestor 20 MG tablet Generic drug: rosuvastatin   ferrous sulfate 325 (65 FE) MG tablet   fluticasone 50 MCG/ACT nasal  spray Commonly known as: FLONASE   gabapentin 800 MG tablet Commonly known as: NEURONTIN Replaced by: gabapentin 400 MG capsule   Lantus SoloStar 100 UNIT/ML Solostar Pen Generic drug: Insulin Glargine Replaced by: insulin glargine 100 UNIT/ML injection   lisinopril 10 MG tablet Commonly known as: ZESTRIL   metoprolol tartrate 25 MG tablet Commonly known as: LOPRESSOR   mometasone 0.1 % cream Commonly known as: ELOCON   Multi-Vitamins Tabs   ondansetron 8 MG tablet Commonly known as: ZOFRAN   Vitamin D3 25 MCG (1000 UT) Caps     TAKE these medications   acetaminophen 500 MG tablet Commonly known as: TYLENOL Take 500-1,000 mg by mouth every 6 (six) hours as needed for moderate pain. What changed: Another medication with the same name was added. Make sure you understand how and when to take each.   acetaminophen 650 MG suppository Commonly known as: TYLENOL Place 1 suppository (650 mg total) rectally every 6 (six) hours as needed for mild pain (or Fever >/= 101). What changed: You were already taking a medication with the same name, and this prescription was added. Make sure you understand how and when to take each.   albuterol 108 (90 Base) MCG/ACT inhaler Commonly known as: VENTOLIN HFA Inhale 2 puffs into the lungs every 6 (six) hours as needed for wheezing or shortness of breath.   Anoro Ellipta 62.5-25 MCG/INH Aepb Generic drug: umeclidinium-vilanterol Inhale 1 puff into the lungs daily as needed (shortness of breath).   gabapentin 400 MG capsule Commonly known as: NEURONTIN Take 1 capsule (400 mg total) by mouth 3 (three) times daily. Replaces: gabapentin 800 MG tablet   insulin aspart 100 UNIT/ML injection Commonly known as: NovoLOG Check blood sugar prior to meals and give insulin on the following scale: Blood sugar less than 150: 0 units  BS 150-200: 2 units BS 200-250: 3 units BS 250-300: 4 units BS 300-350: 5 units BS 350-400: 6 units BS above 400:  8 units and call your doctor.   insulin glargine 100 UNIT/ML injection Commonly known as: LANTUS Inject 0.15 mLs (15 Units total) into the skin at bedtime. Replaces: Lantus SoloStar 100 UNIT/ML Solostar Pen   ipratropium-albuterol 0.5-2.5 (3) MG/3ML Soln Commonly known as: DUONEB Take 3 mLs by nebulization every 6 (six) hours as needed. What changed: reasons to take this   lactulose 10 GM/15ML solution Commonly known as: CHRONULAC Take 45 mLs (30 g total) by mouth 2 (two) times daily.   lip balm Oint Apply 1 application topically as needed for lip care.   magnesium hydroxide 400 MG/5ML suspension Commonly known as: MILK OF MAGNESIA Take 30 mLs by mouth daily as needed for mild constipation.   polyethylene glycol 17 g packet Commonly known as: MIRALAX / GLYCOLAX Take 17 g by mouth daily as needed for moderate constipation.      Contact information for after-discharge care    [facility] .   Service: Skilled Nursing Contact information: [place] [phone]             No Known Allergies  Consultations:  Oncology  Neurology  Palliative Care    Procedures/Studies: EEG  Result Date: 02/07/2019 Alexis Goodell, MD     02/07/2019  3:58 PM ELECTROENCEPHALOGRAM REPORT Patient: Audrey Chan       Room #: QZ30Q EEG No. ID: [id number] Age: 68 y.o.        Sex: female Referring Physician: Mansy Report Date:  02/07/2019       Interpreting Physician: Alexis Goodell History: ANAYELY CONSTANTINE is an 68 y.o. female with altered mental status Medications: Eliquis, ASA, Neurontin, Insulin, Claritin. Lopressor, Crestor Conditions of Recording:  This is a 21 channel routine scalp EEG performed with bipolar and monopolar montages arranged in accordance to the international 10/20 system of electrode placement. One channel was dedicated to EKG recording. The patient is in the awake and drowsy  states. Description:  The background activity is slow and poorly organized.  It is dominated by a low voltage theta activity that is diffusely distributed. Some intermittent underlying delta activity is noted at times as well.  No epileptiform activity is noted.  Hyperventilation was not performed.  Intermittent photic stimulation was performed but failed to illicit any change in the tracing. IMPRESSION: This is an abnormal EEG secondary to general background slowing.  This finding may be seen with a diffuse disturbance that is etiologically nonspecific, but may include a metabolic encephalopathy, among other possibilities.  No epileptiform activity was noted.  Alexis Goodell, MD Neurology [phone] 02/07/2019, 3:52 PM   CT ABDOMEN PELVIS WO CONTRAST  Result Date: 02/02/2019 CLINICAL DATA:  Abdominal distension. Recent diagnosis of pulmonary embolus. Multiple liver lesions post biopsy. EXAM: CT ABDOMEN AND PELVIS WITHOUT CONTRAST TECHNIQUE: Multidetector CT imaging of the abdomen and pelvis was performed following the standard protocol without IV contrast. COMPARISON:  Chest CTA earlier this day. Abdominal CT 2 weeks ago 01/20/2019 FINDINGS: Lower chest: Emphysema scattered subpleural nodules, assessed on chest CT earlier this day. Hepatobiliary: Heterogeneous liver parenchyma. Multiple liver lesions are better defined on prior contrast-enhanced exam. Difficult to assess for change given differences in technique. Postcholecystectomy similar proximal biliary prominence to prior. Motion artifact limits detailed assessment. Pancreas: Motion obscured. Coarse parenchymal calcification in the pancreatic  tail. Ductal dilatation in the tail measures up to 8 mm. Heterogeneous low-density in the pancreatic tail, series 2, image 16. Detailed parenchymal assessment is limited in the lack of contrast and motion. Spleen: Normal in size without focal abnormality. Adrenals/Urinary Tract: Bilateral adrenal thickening. No  hydronephrosis. There is excreted IV contrast in both renal collecting systems from prior chest CTA. Excreted IV contrast in the urinary bladder. No bladder wall thickening. Stomach/Bowel: Bowel evaluation is limited in the absence of contrast and patient motion. Also paucity of intra-abdominal fat. Stomach is nondistended. No small bowel obstruction or obvious inflammation. Normal appendix. Moderate stool burden throughout the colon. No colonic wall thickening or inflammation. Vascular/Lymphatic: Moderate aortic atherosclerosis. No aneurysm. Limited assessment for adenopathy in the absence of contrast and patient motion. Reproductive: Post hysterectomy per history. No evidence of adnexal mass. Other: Small amount of perihepatic free fluid which has slightly increased in size from prior exam. Fluid is slightly dense and likely represents combination of simple ascites and post biopsy hemorrhage. Small amount of blood dependently in the pelvis. Cannot assess for active extravasation in the absence of IV contrast, however volume of fluid would not suggest this. Musculoskeletal: Postsurgical change of the anterior abdominal wall. A focal bone abnormality. IMPRESSION: 1. Post recent liver biopsy. Small amount of high-density fluid adjacent to the liver and tracking in the pelvis likely post biopsy hemorrhage. Cannot assess for active extravasation in the absence of IV contrast, however volume of fluid does not suggest this. Multiple liver lesions are better characterized on prior contrast-enhanced exam. 2. Coarse parenchymal calcification in the pancreatic tail with heterogeneous low-density and distal pancreatic ductal dilatation. In the setting of multiple hepatic lesions, findings are concerning for pancreatic neoplasm, however not well-defined on this noncontrast exam. Aortic Atherosclerosis (ICD10-I70.0). Electronically Signed   By: Keith Rake M.D.   On: 02/02/2019 23:20   CT Head Wo Contrast  Result Date:  02/07/2019 CLINICAL DATA:  Altered mental status. Recent diagnosis of liver metastasis. EXAM: CT HEAD WITHOUT CONTRAST TECHNIQUE: Contiguous axial images were obtained from the base of the skull through the vertex without intravenous contrast. COMPARISON:  Head CT 5 days ago 02/02/2019. Brain MRI 01/01/2019 FINDINGS: Brain: No intracranial hemorrhage, mass effect, or midline shift. Stable brain volume. No hydrocephalus. The basilar cisterns are patent. Unchanged chronic small vessel ischemia. Remote left cerebellar infarct. Small additional cerebellar infarct on MRI last month is not well seen by CT. No evidence of territorial infarct or acute ischemia. No extra-axial or intracranial fluid collection. Vascular: No hyperdense vessel. Skull: No fracture or focal lesion. Sinuses/Orbits: Paranasal sinuses and mastoid air cells are clear. The visualized orbits are unremarkable. Other: None. IMPRESSION: 1. No acute intracranial abnormality. 2. Unchanged atrophy and chronic ischemia. Electronically Signed   By: Keith Rake M.D.   On: 02/07/2019 03:54   CT Head Wo Contrast  Result Date: 02/02/2019 CLINICAL DATA:  Altered mental status EXAM: CT HEAD WITHOUT CONTRAST TECHNIQUE: Contiguous axial images were obtained from the base of the skull through the vertex without intravenous contrast. COMPARISON:  12/31/2018 FINDINGS: Brain: Chronic atrophic and ischemic changes are again seen and stable. No acute hemorrhage, acute infarction or space-occupying mass lesion are seen. Vascular: No hyperdense vessel or unexpected calcification. Skull: Normal. Negative for fracture or focal lesion. Sinuses/Orbits: No acute finding. Other: None. IMPRESSION: Chronic atrophic and ischemic changes similar to that seen on the prior exam. No acute abnormality is noted. Electronically Signed   By: Linus Mako.D.  On: 02/02/2019 20:31   CT Angio Chest PE W/Cm &/Or Wo Cm  Result Date: 02/02/2019 CLINICAL DATA:  Shortness of breath  EXAM: CT ANGIOGRAPHY CHEST WITH CONTRAST TECHNIQUE: Multidetector CT imaging of the chest was performed using the standard protocol during bolus administration of intravenous contrast. Multiplanar CT image reconstructions and MIPs were obtained to evaluate the vascular anatomy. CONTRAST:  37m OMNIPAQUE IOHEXOL 350 MG/ML SOLN COMPARISON:  01/20/2019 FINDINGS: Cardiovascular: Thoracic aorta demonstrates atherosclerotic calcifications without aneurysmal dilatation or dissection. The heart is at the upper limits of normal in size. The pulmonary artery is well visualized with a normal branching pattern. The previously seen pulmonary emboli are again identified bilaterally with some minimal improvement in the small central branches. No significant resolution of the more peripheral emboli is noted. No new significant emboli are noted. Changes of right heart strain are again noted and stable. Mediastinum/Nodes: The esophagus as visualized is within normal limits. No hilar or mediastinal adenopathy is seen. The thoracic inlet is within normal limits. Lungs/Pleura: Diffuse emphysematous changes are noted throughout both lungs. Scattered subpleural nodular changes are seen without significant increase from the prior study. No focal confluent infiltrate is seen. No sizable effusion is noted. No pneumothorax seen. Upper Abdomen: Visualized upper abdomen demonstrates mild perihepatic fluid stable from the prior exam. Stable hypodense lesions are noted throughout the liver consistent with the given clinical history of hepatic metastatic disease. Musculoskeletal: Degenerative changes of the thoracic spine are noted. No acute bony abnormality is seen. Review of the MIP images confirms the above findings. IMPRESSION: Slight improvement in pulmonary emboli bilaterally although the peripheral emboli appear stable. No new emboli are seen. Persistent right heart strain. Emphysematous changes without acute infiltrate. Multiple pulmonary  nodules stable from the prior exam which may represent some metastatic disease Hepatic metastatic disease. Aortic Atherosclerosis (ICD10-I70.0) and Emphysema (ICD10-J43.9). Electronically Signed   By: MInez CatalinaM.D.   On: 02/02/2019 20:39   CT Angio Chest PE W/Cm &/Or Wo Cm  Result Date: 01/20/2019 CLINICAL DATA:  Suspected covid, elevated LFTs, recent stroke EXAM: CT ANGIOGRAPHY CHEST, abdomen, and pelvis WITH CONTRAST TECHNIQUE: Multidetector CT imaging of the chest, abdomen, and pelvis was performed using the standard protocol during bolus administration of intravenous contrast. Multiplanar CT image reconstructions and MIPs were obtained to evaluate the vascular anatomy. CONTRAST:  717mOMNIPAQUE IOHEXOL 350 MG/ML SOLN COMPARISON:  CT chest September 13, 2017 FINDINGS: Cardiovascular: There is a optimal opacification of the pulmonary arteries. There is a small nonocclusive thrombus seen within the left main pulmonary artery which extends into the anterior left upper lobe segmental branches and partially occlusive thrombus in the posterior left lower lobe subsegmental branches. There is also partially occlusive thrombus seen within the posterior right upper lobe segmental branch, series 6, image 37. There is also partially occlusive thrombus seen extending into the right middle lobe subsegmental branches and posterior right lower lobe subsegmental branches. The heart is normal in size. No pericardial effusion or thickening. There is slight straightening of the interventricular septum which could be due to early right ventricular heart strain. There is normal three-vessel brachiocephalic anatomy without proximal stenosis. Scattered atherosclerosis is noted. The thoracic aorta is normal in appearance. Mediastinum/Nodes: No hilar, mediastinal, or axillary adenopathy. Thyroid gland, trachea, and esophagus demonstrate no significant findings. Lungs/Pleura: Small subcentimeter pulmonary nodule are again identified as  on prior exam dating back to 2019, for example in the posterior right upper lobe there is a 5 mm pulmonary nodule, series 8, image  21 and anterior right lower lobe, series 8, image 54 a 4 mm pulmonary nodule. There is however a new small nodular opacity in the posterior left upper lobe, series 8, image 47 measuring 7 mm not seen on prior exam. No large airspace consolidation or pleural effusion is seen. Centrilobular emphysematous changes seen within both lung apices. Musculoskeletal: No chest wall abnormality. No acute or significant osseous findings. Review of the MIP images confirms the above findings. Abdomen/pelvis: Hepatobiliary: There are multiple low-density liver lesions seen throughout which were not seen on the prior exam. There is also heterogeneous enhancement seen throughout the liver parenchyma. Central periportal edema is noted. The portal vein is patent. The patient is status post cholecystectomy. A small amount of perihepatic ascites is noted. Pancreas: Unremarkable. No pancreatic ductal dilatation or surrounding inflammatory changes. Spleen: Normal in size without focal abnormality. Adrenals/Urinary Tract: Both adrenal glands appear normal. The kidneys and collecting system appear normal without evidence of urinary tract calculus or hydronephrosis. Bladder is unremarkable. Stomach/Bowel: The stomach, small bowel, and colon are normal in appearance. No inflammatory changes, wall thickening, or obstructive findings. Vascular/Lymphatic: There are no enlarged mesenteric, retroperitoneal, or pelvic lymph nodes. Scattered aortic atherosclerotic calcifications are seen without aneurysmal dilatation. Reproductive: The uterus and adnexa are unremarkable. Other: No evidence of abdominal wall mass or hernia. Musculoskeletal: No acute or significant osseous findings. IMPRESSION: 1. Bilateral partially occlusive segmental and subsegmental pulmonary emboli as described above. 2. Findings which could be  suggestive of early right ventricular heart strain. 3. Multiple unchanged subcentimeter pulmonary nodules as on prior exam dating back to 2019. 4. New nodular opacity within the posterior left upper lobe measuring 7 mm which is nonspecific, and would recommend attention on short interval follow-up exam. 5. Centrilobular emphysematous changes. 6. Numerous hepatic lesions throughout, likely suggestive of hepatic metastatic disease. 7. Small amount of perihepatic ascites 8. These results were called by telephone at the time of interpretation on 01/20/2019 at 2:07 am to provider [facility] , who verbally acknowledged these results. Electronically Signed   By: Prudencio Pair M.D.   On: 01/20/2019 02:09   MR BRAIN WO CONTRAST  Result Date: 02/07/2019 CLINICAL DATA:  TIA.  Renal failure and stage IV liver cancer. EXAM: MRI HEAD WITHOUT CONTRAST TECHNIQUE: Multiplanar, multiecho pulse sequences of the brain and surrounding structures were obtained without intravenous contrast. COMPARISON:  MRI head 01/01/2019.  CT head 02/07/2019 FINDINGS: Brain: Multiple small areas of acute infarct throughout both cerebellar hemispheres. Small areas of acute/subacute infarct in both cerebral hemispheres involving the frontal and parietal lobes. Extensive chronic microvascular ischemic change throughout the cerebral white matter bilaterally. Chronic infarcts in the cerebellum on the left. Small areas of hemorrhage in the right temporal and parietal lobe, not seen previously. Methemoglobin also noted in the parietal lobe bilaterally due to prior hemorrhage. Methemoglobin also noted in the left cerebellum at site of prior infarct. Ventricle size normal.  No mass lesion or midline shift Vascular: Normal arterial flow voids. Skull and upper cervical spine: Negative. Cervical fusion hardware noted. Sinuses/Orbits: Negative Other: None IMPRESSION: Numerous small areas of acute or subacute infarct in both cerebral hemispheres and both  cerebellar hemispheres. Findings are suggestive of emboli. Extensive chronic ischemic change in the white matter bilaterally. Chronic infarct left cerebellum. Scattered small areas of hemorrhage in the brain which appear to be subacute to chronic with evidence of methemoglobin several areas of hemorrhage. Electronically Signed   By: Franchot Gallo M.D.   On: 02/07/2019 12:08  CT abd/pelvis w/ IV contrast  Result Date: 01/20/2019 CLINICAL DATA:  Suspected covid, elevated LFTs, recent stroke EXAM: CT ANGIOGRAPHY CHEST, abdomen, and pelvis WITH CONTRAST TECHNIQUE: Multidetector CT imaging of the chest, abdomen, and pelvis was performed using the standard protocol during bolus administration of intravenous contrast. Multiplanar CT image reconstructions and MIPs were obtained to evaluate the vascular anatomy. CONTRAST:  5m OMNIPAQUE IOHEXOL 350 MG/ML SOLN COMPARISON:  CT chest September 13, 2017 FINDINGS: Cardiovascular: There is a optimal opacification of the pulmonary arteries. There is a small nonocclusive thrombus seen within the left main pulmonary artery which extends into the anterior left upper lobe segmental branches and partially occlusive thrombus in the posterior left lower lobe subsegmental branches. There is also partially occlusive thrombus seen within the posterior right upper lobe segmental branch, series 6, image 37. There is also partially occlusive thrombus seen extending into the right middle lobe subsegmental branches and posterior right lower lobe subsegmental branches. The heart is normal in size. No pericardial effusion or thickening. There is slight straightening of the interventricular septum which could be due to early right ventricular heart strain. There is normal three-vessel brachiocephalic anatomy without proximal stenosis. Scattered atherosclerosis is noted. The thoracic aorta is normal in appearance. Mediastinum/Nodes: No hilar, mediastinal, or axillary adenopathy. Thyroid gland,  trachea, and esophagus demonstrate no significant findings. Lungs/Pleura: Small subcentimeter pulmonary nodule are again identified as on prior exam dating back to 2019, for example in the posterior right upper lobe there is a 5 mm pulmonary nodule, series 8, image 21 and anterior right lower lobe, series 8, image 54 a 4 mm pulmonary nodule. There is however a new small nodular opacity in the posterior left upper lobe, series 8, image 47 measuring 7 mm not seen on prior exam. No large airspace consolidation or pleural effusion is seen. Centrilobular emphysematous changes seen within both lung apices. Musculoskeletal: No chest wall abnormality. No acute or significant osseous findings. Review of the MIP images confirms the above findings. Abdomen/pelvis: Hepatobiliary: There are multiple low-density liver lesions seen throughout which were not seen on the prior exam. There is also heterogeneous enhancement seen throughout the liver parenchyma. Central periportal edema is noted. The portal vein is patent. The patient is status post cholecystectomy. A small amount of perihepatic ascites is noted. Pancreas: Unremarkable. No pancreatic ductal dilatation or surrounding inflammatory changes. Spleen: Normal in size without focal abnormality. Adrenals/Urinary Tract: Both adrenal glands appear normal. The kidneys and collecting system appear normal without evidence of urinary tract calculus or hydronephrosis. Bladder is unremarkable. Stomach/Bowel: The stomach, small bowel, and colon are normal in appearance. No inflammatory changes, wall thickening, or obstructive findings. Vascular/Lymphatic: There are no enlarged mesenteric, retroperitoneal, or pelvic lymph nodes. Scattered aortic atherosclerotic calcifications are seen without aneurysmal dilatation. Reproductive: The uterus and adnexa are unremarkable. Other: No evidence of abdominal wall mass or hernia. Musculoskeletal: No acute or significant osseous findings.  IMPRESSION: 1. Bilateral partially occlusive segmental and subsegmental pulmonary emboli as described above. 2. Findings which could be suggestive of early right ventricular heart strain. 3. Multiple unchanged subcentimeter pulmonary nodules as on prior exam dating back to 2019. 4. New nodular opacity within the posterior left upper lobe measuring 7 mm which is nonspecific, and would recommend attention on short interval follow-up exam. 5. Centrilobular emphysematous changes. 6. Numerous hepatic lesions throughout, likely suggestive of hepatic metastatic disease. 7. Small amount of perihepatic ascites 8. These results were called by telephone at the time of interpretation on 01/20/2019 at 2:07  am to provider [facility] , who verbally acknowledged these results. Electronically Signed   By: Prudencio Pair M.D.   On: 01/20/2019 02:09   US Carotid Bilateral  Result Date: 02/07/2019 CLINICAL DATA:  68 year old female with a history of TIA EXAM: BILATERAL CAROTID DUPLEX ULTRASOUND TECHNIQUE: Pearline Cables scale imaging, color Doppler and duplex ultrasound were performed of bilateral carotid and vertebral arteries in the neck. COMPARISON:  None. FINDINGS: Criteria: Quantification of carotid stenosis is based on velocity parameters that correlate the residual internal carotid diameter with NASCET-based stenosis levels, using the diameter of the distal internal carotid lumen as the denominator for stenosis measurement. The following velocity measurements were obtained: RIGHT ICA:  Systolic 94 cm/sec, Diastolic 28 cm/sec CCA:  43 cm/sec SYSTOLIC ICA/CCA RATIO:  2.2 ECA:  43 cm/sec LEFT ICA:  Systolic 88 cm/sec, Diastolic 29 cm/sec CCA:  42 cm/sec SYSTOLIC ICA/CCA RATIO:  2.1 ECA:  34 cm/sec Right Brachial SBP: Not acquired Left Brachial SBP: Not acquired RIGHT CAROTID ARTERY: No significant calcified disease of the right common carotid artery. Intermediate waveform maintained. Heterogeneous plaque without significant  calcifications at the right carotid bifurcation. Low resistance waveform of the right ICA. No significant tortuosity. RIGHT VERTEBRAL ARTERY: Antegrade flow with low resistance waveform. LEFT CAROTID ARTERY: No significant calcified disease of the left common carotid artery. Intermediate waveform maintained. Heterogeneous plaque at the left carotid bifurcation without significant calcifications. Low resistance waveform of the left ICA. LEFT VERTEBRAL ARTERY:  Antegrade flow with low resistance waveform. Additional: Incidental bilateral thyroid nodules. IMPRESSION: Mild heterogeneous plaque at the bilateral carotid bifurcation, with discordant results regarding degree of stenosis by established duplex criteria. Peak velocity suggests no significant stenosis, with the ICA/ CCA ratio suggesting a greater degree of stenosis, bilaterally. If establishing a more accurate degree of stenosis is required, cerebral angiogram should be considered, or as a second best test, CTA. Bilateral thyroid nodules. Referral for a dedicated thyroid ultrasound may be considered. Signed, Dulcy Fanny. Dellia Nims, RPVI [facility] Electronically Signed   By: Corrie Mckusick D.O.   On: 02/07/2019 12:18   PERIPHERAL VASCULAR CATHETERIZATION  Result Date: 01/22/2019 See op note  US Venous Img Lower Bilateral (DVT)  Result Date: 01/20/2019 CLINICAL DATA:  History of pulmonary embolism, now with lower extremity pain. Evaluate for DVT. EXAM: BILATERAL LOWER EXTREMITY VENOUS DOPPLER ULTRASOUND TECHNIQUE: Gray-scale sonography with graded compression, as well as color Doppler and duplex ultrasound were performed to evaluate the lower extremity deep venous systems from the level of the common femoral vein and including the common femoral, femoral, profunda femoral, popliteal and calf veins including the posterior tibial, peroneal and gastrocnemius veins when visible. The superficial great  saphenous vein was also interrogated. Spectral Doppler was utilized to evaluate flow at rest and with distal augmentation maneuvers in the common femoral, femoral and popliteal veins. COMPARISON:  Chest CTA-01/20/2019 FINDINGS: RIGHT LOWER EXTREMITY Common Femoral Vein: No evidence of thrombus. Normal compressibility, respiratory phasicity and response to augmentation. Saphenofemoral Junction: No evidence of thrombus. Normal compressibility and flow on color Doppler imaging. Profunda Femoral Vein: No evidence of thrombus. Normal compressibility and flow on color Doppler imaging. Femoral Vein: No evidence of thrombus. Normal compressibility, respiratory phasicity and response to augmentation. Popliteal Vein: No evidence of thrombus. Normal compressibility, respiratory phasicity and response to augmentation. Calf Veins: There is hypoechoic occlusive thrombus within both paired right peroneal veins (images 58 and 59). Both paired right posterior tibial veins appear patent where imaged. Superficial  Great Saphenous Vein: No evidence of thrombus. Normal compressibility. Other Findings:  None. _________________________________________________________ LEFT LOWER EXTREMITY Common Femoral Vein: No evidence of thrombus. Normal compressibility, respiratory phasicity and response to augmentation. Saphenofemoral Junction: No evidence of thrombus. Normal compressibility and flow on color Doppler imaging. Profunda Femoral Vein: No evidence of thrombus. Normal compressibility and flow on color Doppler imaging. Femoral Vein: No evidence of thrombus. Normal compressibility, respiratory phasicity and response to augmentation. Popliteal Vein: No evidence of thrombus. Normal compressibility, respiratory phasicity and response to augmentation. Calf Veins: There is hypoechoic occlusive thrombus within 1 of the paired left peroneal veins (images 29 and 30). The adjacent paired left peroneal vein as well though both paired posterior tibial  veins appear patent. Superficial Great Saphenous Vein: No evidence of thrombus. Normal compressibility. Other Findings:  None. IMPRESSION: Examination is positive for occlusive DVT involving both paired right peroneal veins as well as one of two paired left peroneal veins. There is no extension of this distal tibial DVT to the more proximal venous system of either lower extremity. Electronically Signed   By: [name] Mariscal M.D.   On: 01/20/2019 17:02   CT BIOPSY  Result Date: 01/27/2019 INDICATION: Multiple liver lesions. EXAM: CT directed liver biopsy. MEDICATIONS: None. ANESTHESIA/SEDATION: Moderate (conscious) sedation was employed during this procedure. A total of Versed 2 mg and Fentanyl 25 mcg was administered intravenously. Moderate Sedation Time: 14 minutes. The patient's level of consciousness and vital signs were monitored continuously by radiology nursing throughout the procedure under my direct supervision. FLUOROSCOPY TIME:  Fluoroscopy Time: 0 minutes 0 seconds (0 mGy). COMPLICATIONS: None immediate. PROCEDURE: After discussing the risks and benefits of this procedure the patient informed consent was obtained. Standard time-out procedure was performed. Patient was sterilely prepped and draped. Following local anesthesia with 1% lidocaine and IV conscious sedation CT-directed liver lesion core biopsy was obtained with 2 good 23 mm 18 gauge core biopsy samples obtained. Sample sent to pathology. There no complications. IMPRESSION: Successful liver lesion CT-directed biopsy. Electronically Signed   By: Marcello Moores  Register   On: 01/27/2019 11:47   DG Chest Portable 1 View  Result Date: 02/07/2019 CLINICAL DATA:  Hypoxia. EXAM: PORTABLE CHEST 1 VIEW COMPARISON:  Chest radiograph and CTA 5 days ago 02/02/2019 FINDINGS: Lower lung volumes from prior exam. Emphysema. Small pulmonary nodules on prior CT are not well visualized radiographically. No pulmonary edema, focal airspace disease, pleural effusion or  pneumothorax. No acute osseous abnormalities are seen. IMPRESSION: 1. Emphysema without acute abnormality. 2. Small pulmonary nodules on prior CT are not well visualized radiographically. Electronically Signed   By: Keith Rake M.D.   On: 02/07/2019 03:44   DG Chest Portable 1 View  Result Date: 02/02/2019 CLINICAL DATA:  Shortness of breath, stomach pain EXAM: PORTABLE CHEST 1 VIEW COMPARISON:  01/23/2019 FINDINGS: The heart size and mediastinal contours are within normal limits. Emphysema. The visualized skeletal structures are unremarkable. IMPRESSION: Emphysema without acute abnormality of the lungs in AP portable projection. Electronically Signed   By: Eddie Candle M.D.   On: 02/02/2019 19:06   DG Chest Port 1 View  Result Date: 01/23/2019 CLINICAL DATA:  Hypertension, pulmonary emboli EXAM: PORTABLE CHEST 1 VIEW COMPARISON:  01/20/2019, 01/19/2019 FINDINGS: The heart size and mediastinal contours are stable. No new focal airspace consolidation, pleural effusion, or pneumothorax. Lungs are mildly hyperexpanded. The visualized skeletal structures are unremarkable. IMPRESSION: Stable appearance of the chest without new airspace opacity. Electronically Signed   By: Marisue Brooklyn.D.  On: 01/23/2019 12:54   DG Chest Portable 1 View  Result Date: 01/19/2019 CLINICAL DATA:  Shortness of breath EXAM: PORTABLE CHEST 1 VIEW COMPARISON:  12/31/2018 FINDINGS: Cardiomegaly. Lungs clear. No effusions or edema. No acute bony abnormality. IMPRESSION: Cardiomegaly.  No active disease. Electronically Signed   By: Rolm Baptise M.D.   On: 01/19/2019 23:33   ECHOCARDIOGRAM COMPLETE  Result Date: 02/03/2019   ECHOCARDIOGRAM REPORT   Patient Name:   MALAISHA SILLIMAN Date of Exam: 02/03/2019 Medical Rec #:  [id number]         Height:       66.0 in Accession #:    [id number]        Weight:       132.0 lb Date of Birth:  [birth year]         BSA:          1.68 m Patient Age:    52 years          BP:            145/86 mmHg Patient Gender: F                 HR:           95 bpm. Exam Location:  [facility] Procedure: 2D Echo, Cardiac Doppler and Color Doppler Indications:     I50.31 CHF-Acute Diastolic  History:         Patient has prior history of Echocardiogram examinations, most                  recent 01/20/2019. Risk Factors:Hypertension and Diabetes.  Sonographer:     Charmayne Sheer RDCS (AE) Referring Phys:  [id number] Athena Masse Diagnosing Phys: Kathlyn Sacramento MD  Sonographer Comments: Suboptimal subcostal window. IMPRESSIONS  1. Left ventricular ejection fraction, by visual estimation, is 55 to 60%. The left ventricle has normal function. There is mildly increased left ventricular hypertrophy.  2. Left ventricular diastolic parameters are consistent with Grade I diastolic dysfunction (impaired relaxation).  3. Right ventricular pressure overload.  4. The left ventricle has no regional wall motion abnormalities.  5. Global right ventricle has mildly reduced systolic function.The right ventricular size is moderately enlarged. No increase in right ventricular wall thickness.  6. Left atrial size was normal.  7. Right atrial size was moderately dilated.  8. The mitral valve is normal in structure. No evidence of mitral valve regurgitation. No evidence of mitral stenosis.  9. The tricuspid valve is normal in structure. Tricuspid valve regurgitation moderate. 10. The aortic valve is normal in structure. Aortic valve regurgitation is not visualized. Mild to moderate aortic valve sclerosis/calcification without any evidence of aortic stenosis. 11. The pulmonic valve was normal in structure. Pulmonic valve regurgitation is not visualized. 12. Moderately to severely elevated pulmonary artery systolic pressure. 13. The tricuspid regurgitant velocity is 4.02 m/s, and with an assumed right atrial pressure of 5 mmHg, the estimated right ventricular systolic pressure is moderately elevated at 69.6 mmHg. FINDINGS  Left Ventricle: Left  ventricular ejection fraction, by visual estimation, is 55 to 60%. The left ventricle has normal function. The left ventricle has no regional wall motion abnormalities. There is mildly increased left ventricular hypertrophy. The interventricular septum is flattened in systole, consistent with right ventricular pressure overload. Left ventricular diastolic parameters are consistent with Grade I diastolic dysfunction (impaired relaxation). Normal left atrial pressure. Right Ventricle: The right ventricular size is moderately enlarged. No increase in right ventricular wall thickness. Global RV  systolic function is has mildly reduced systolic function. The tricuspid regurgitant velocity is 4.02 m/s, and with an assumed right atrial pressure of 5 mmHg, the estimated right ventricular systolic pressure is moderately elevated at 69.6 mmHg. Left Atrium: Left atrial size was normal in size. Right Atrium: Right atrial size was moderately dilated Pericardium: There is no evidence of pericardial effusion. Mitral Valve: The mitral valve is normal in structure. No evidence of mitral valve regurgitation. No evidence of mitral valve stenosis by observation. MV peak gradient, 4.4 mmHg. Tricuspid Valve: The tricuspid valve is normal in structure. Tricuspid valve regurgitation moderate. Aortic Valve: The aortic valve is normal in structure. Aortic valve regurgitation is not visualized. Mild to moderate aortic valve sclerosis/calcification is present, without any evidence of aortic stenosis. Aortic valve mean gradient measures 4.0 mmHg. Aortic valve peak gradient measures 6.9 mmHg. Aortic valve area, by VTI measures 2.09 cm. Pulmonic Valve: The pulmonic valve was normal in structure. Pulmonic valve regurgitation is not visualized. Pulmonic regurgitation is not visualized. Aorta: The aortic root, ascending aorta and aortic arch are all structurally normal, with no evidence of dilitation or obstruction. IAS/Shunts: No atrial level shunt  detected by color flow Doppler. There is no evidence of a patent foramen ovale. No ventricular septal defect is seen or detected. There is no evidence of an atrial septal defect.  LEFT VENTRICLE PLAX 2D LVIDd:         3.70 cm  Diastology LVIDs:         2.41 cm  LV e' lateral:   6.64 cm/s LV PW:         0.78 cm  LV E/e' lateral: 7.4 LV IVS:        0.71 cm LVOT diam:     1.90 cm LV SV:         38 ml LV SV Index:   22.62 LVOT Area:     2.84 cm  RIGHT VENTRICLE RV Basal diam:  2.91 cm LEFT ATRIUM             Index       RIGHT ATRIUM           Index LA diam:        2.80 cm 1.67 cm/m  RA Area:     23.20 cm LA Vol (A2C):   31.5 ml 18.79 ml/m RA Volume:   86.80 ml  51.79 ml/m LA Vol (A4C):   27.0 ml 16.11 ml/m LA Biplane Vol: 29.8 ml 17.78 ml/m  AORTIC VALVE                   PULMONIC VALVE AV Area (Vmax):    1.97 cm    PV Vmax:       0.75 m/s AV Area (Vmean):   1.94 cm    PV Vmean:      54.900 cm/s AV Area (VTI):     2.09 cm    PV VTI:        0.146 m AV Vmax:           131.00 cm/s PV Peak grad:  2.3 mmHg AV Vmean:          86.200 cm/s PV Mean grad:  1.0 mmHg AV VTI:            0.209 m AV Peak Grad:      6.9 mmHg AV Mean Grad:      4.0 mmHg LVOT Vmax:         90.90  cm/s LVOT Vmean:        59.100 cm/s LVOT VTI:          0.154 m LVOT/AV VTI ratio: 0.74  AORTA Ao Root diam: 2.70 cm MITRAL VALVE                        TRICUSPID VALVE MV Area (PHT): 3.91 cm             TR Peak grad:   64.6 mmHg MV Peak grad:  4.4 mmHg             TR Vmax:        421.00 cm/s MV Mean grad:  2.0 mmHg MV Vmax:       1.05 m/s             SHUNTS MV Vmean:      64.9 cm/s            Systemic VTI:  0.15 m MV VTI:        0.14 m               Systemic Diam: 1.90 cm MV PHT:        56.26 msec MV Decel Time: 194 msec MV E velocity: 49.00 cm/s 103 cm/s MV A velocity: 97.00 cm/s 70.3 cm/s MV E/A ratio:  0.51       1.5  Kathlyn Sacramento MD Electronically signed by Kathlyn Sacramento MD Signature Date/Time: 02/03/2019/1:36:07 PM    Final    ECHOCARDIOGRAM  COMPLETE  Result Date: 01/20/2019   ECHOCARDIOGRAM REPORT   Patient Name:   SATARA VIRELLA Date of Exam: 01/20/2019 Medical Rec #:  [id number]         Height:       66.0 in Accession #:    [id number]        Weight:       122.0 lb Date of Birth:  [birth year]         BSA:          1.62 m Patient Age:    60 years          BP:           116/71 mmHg Patient Gender: F                 HR:           91 bpm. Exam Location:  [facility] Procedure: 2D Echo, Cardiac Doppler and Color Doppler Indications:     Abnormal ECG 794.31  History:         Patient has no prior history of Echocardiogram examinations.                  Risk Factors:Hypertension and Diabetes.  Sonographer:     Sherrie Sport RDCS (AE) Referring Phys:  [id number] Victoriano Lain A THOMAS Diagnosing Phys: Serafina Royals MD IMPRESSIONS  1. Left ventricular ejection fraction, by visual estimation, is 55 to 60%. The left ventricle has normal function. There is no left ventricular hypertrophy.  2. Global right ventricle has moderately reduced systolic function.The right ventricular size is moderately enlarged. Mildly increased right ventricular wall thickness.  3. Left atrial size was normal.  4. Right atrial size was moderately dilated.  5. The mitral valve is normal in structure. Trace mitral valve regurgitation.  6. The tricuspid valve is normal in structure. Tricuspid valve regurgitation moderate.  7. The aortic valve is normal in structure. Aortic valve regurgitation is not  visualized.  8. The pulmonic valve was grossly normal. Pulmonic valve regurgitation is trivial.  9. Moderately elevated pulmonary artery systolic pressure. 10. The tricuspid regurgitant velocity is 3.62 m/s, and with an assumed right atrial pressure of 10 mmHg, the estimated right ventricular systolic pressure is moderately elevated at 62.6 mmHg. FINDINGS  Left Ventricle: Left ventricular ejection fraction, by visual estimation, is 55 to 60%. The left ventricle has normal function. There is no left  ventricular hypertrophy. Right Ventricle: The right ventricular size is moderately enlarged. Mildly increased right ventricular wall thickness. Global RV systolic function is has moderately reduced systolic function. The tricuspid regurgitant velocity is 3.62 m/s, and with an assumed right atrial pressure of 10 mmHg, the estimated right ventricular systolic pressure is moderately elevated at 62.6 mmHg. Left Atrium: Left atrial size was normal in size. Right Atrium: Right atrial size was moderately dilated Pericardium: There is no evidence of pericardial effusion. Mitral Valve: The mitral valve is normal in structure. Trace mitral valve regurgitation. Tricuspid Valve: The tricuspid valve is normal in structure. Tricuspid valve regurgitation moderate. Aortic Valve: The aortic valve is normal in structure. Aortic valve regurgitation is not visualized. Aortic valve mean gradient measures 3.7 mmHg. Aortic valve peak gradient measures 5.2 mmHg. Aortic valve area, by VTI measures 1.65 cm. Pulmonic Valve: The pulmonic valve was grossly normal. Pulmonic valve regurgitation is trivial. Aorta: The aortic root and ascending aorta are structurally normal, with no evidence of dilitation. IAS/Shunts: No atrial level shunt detected by color flow Doppler.  LEFT VENTRICLE PLAX 2D LVIDd:         3.16 cm  Diastology LVIDs:         1.90 cm  LV e' lateral:   5.77 cm/s LV PW:         1.41 cm  LV E/e' lateral: 9.3 LV IVS:        1.11 cm  LV e' medial:    3.92 cm/s LVOT diam:     2.00 cm  LV E/e' medial:  13.6 LV SV:         29 ml LV SV Index:   17.85 LVOT Area:     3.14 cm  RIGHT VENTRICLE RV Basal diam:  4.44 cm RV S prime:     10.20 cm/s TAPSE (M-mode): 3.0 cm LEFT ATRIUM             Index       RIGHT ATRIUM           Index LA diam:        2.40 cm 1.48 cm/m  RA Area:     25.70 cm LA Vol (A2C):   21.1 ml 13.02 ml/m RA Volume:   96.00 ml  59.23 ml/m LA Vol (A4C):   9.6 ml  5.94 ml/m LA Biplane Vol: 14.6 ml 9.01 ml/m  AORTIC VALVE                    PULMONIC VALVE AV Area (Vmax):    1.61 cm    PV Vmax:        0.42 m/s AV Area (Vmean):   1.45 cm    PV Peak grad:   0.7 mmHg AV Area (VTI):     1.65 cm    RVOT Peak grad: 1 mmHg AV Vmax:           113.67 cm/s AV Vmean:          86.200 cm/s AV VTI:  0.186 m AV Peak Grad:      5.2 mmHg AV Mean Grad:      3.7 mmHg LVOT Vmax:         58.40 cm/s LVOT Vmean:        39.900 cm/s LVOT VTI:          0.098 m LVOT/AV VTI ratio: 0.52  AORTA Ao Root diam: 2.90 cm MITRAL VALVE                        TRICUSPID VALVE MV Area (PHT): 6.32 cm             TR Peak grad:   52.6 mmHg MV PHT:        34.80 msec           TR Vmax:        364.00 cm/s MV Decel Time: 120 msec MV E velocity: 53.40 cm/s 103 cm/s  SHUNTS MV A velocity: 94.10 cm/s 70.3 cm/s Systemic VTI:  0.10 m MV E/A ratio:  0.57       1.5       Systemic Diam: 2.00 cm  Serafina Royals MD Electronically signed by Serafina Royals MD Signature Date/Time: 01/20/2019/4:41:26 PM    Final        Subjective: Patient more alert this morning when seen.  Remains oriented to self.  Answers yes to every question asked, but did deny pain or discomfort.  No acute events reported.   Discharge Exam: Vitals:   02/13/19 0415 02/13/19 0900  BP:  (!) 142/74  Pulse: 96 80  Resp:  17  Temp:  98.5 F (36.9 C)  SpO2: 93% 94%   Vitals:   02/12/19 1931 02/13/19 0341 02/13/19 0415 02/13/19 0900  BP: 131/77 140/80  (!) 142/74  Pulse: (!) 102 98 96 80  Resp: _0 Temp: 98.7 F (37.1 C) (!) 97.4 F (36.3 C)  98.5 F (36.9 C)  TempSrc: Oral Oral    SpO2: 90% (!) 85% 93% 94%  Weight:      Height:        General: Pt is alert, awake, not in acute distress Cardiovascular: RRR, S1/S2 +, no rubs, no gallops Respiratory: CTA bilaterally, no wheezing, no rhonchi Abdominal: Soft, NT, ND, bowel sounds + Extremities: no edema, no cyanosis    The results of significant diagnostics from this hospitalization (including imaging, microbiology, ancillary  and laboratory) are listed below for reference.     Microbiology: Recent Results (from the past 240 hour(s))  SARS CORONAVIRUS 2 (TAT 6-24 HRS) Nasopharyngeal Nasopharyngeal Swab     Status: None   Collection Time: 02/10/19  2:16 PM   Specimen: Nasopharyngeal Swab  Result Value Ref Range Status   SARS Coronavirus 2 NEGATIVE NEGATIVE Final    Comment: (NOTE) SARS-CoV-2 target nucleic acids are NOT DETECTED. The SARS-CoV-2 RNA is generally detectable in upper and lower respiratory specimens during the acute phase of infection. Negative results do not preclude SARS-CoV-2 infection, do not rule out co-infections with other pathogens, and should not be used as the sole basis for treatment or other patient management decisions. Negative results must be combined with clinical observations, patient history, and epidemiological information. The expected result is Negative. Fact Sheet for Patients: SugarRoll.be Fact Sheet for Healthcare Providers: https://www.woods-mathews.com/ This test is not yet approved or cleared by the [place] FDA and  has been authorized for detection and/or diagnosis of SARS-CoV-2 by FDA under an  Emergency Use Authorization (EUA). This EUA will remain  in effect (meaning this test can be used) for the duration of the COVID-19 declaration under Section 56 4(b)(1) of the Act, 21 U.S.C. section 360bbb-3(b)(1), unless the authorization is terminated or revoked sooner. Performed at [facility], [place] [address], [place], [place] [address]      Labs: BNP (last 3 results) Recent Labs    01/19/19 2325 01/20/19 1736 02/02/19 1929  BNP 876.0* 893.0* 024.0*   Basic Metabolic Panel: Recent Labs  Lab 02/07/19 0414 02/07/19 0536 02/08/19 0454 02/09/19 0515 02/10/19 0614 02/10/19 1618 02/11/19 0334  NA 134*  --  141 135 136  --  138  K 6.5* 4.6 5.0 5.1 5.8* 4.7 4.8  CL 105  --  112* 105 104  --  108  CO2 17*   --  19* 20* 14*  --  17*  GLUCOSE 368*  --  72 316* 349*  --  248*  BUN 40*  --  31* 26* 28*  --  30*  CREATININE 1.82*  --  1.25* 1.23* 1.37*  --  1.44*  CALCIUM 8.1*  --  8.1* 8.2* 8.6*  --  8.8*  MG  --  1.9  --   --  1.9  --   --   PHOS  --  4.3  --   --   --   --   --    Liver Function Tests: No results for input(s): AST, ALT, ALKPHOS, BILITOT, PROT, ALBUMIN in the last 168 hours. No results for input(s): LIPASE, AMYLASE in the last 168 hours. Recent Labs  Lab 02/07/19 0414 02/12/19 1019  AMMONIA 36* 21   CBC: Recent Labs  Lab 02/07/19 0414 02/08/19 0454 02/11/19 0334  WBC 13.5* 10.3 10.3  NEUTROABS 12.1*  --   --   HGB 10.3* 10.4* 11.1*  HCT 33.1* 33.7* 33.7*  MCV 73.7* 73.3* 68.1*  PLT 391 331 295   Cardiac Enzymes: No results for input(s): CKTOTAL, CKMB, CKMBINDEX, TROPONINI in the last 168 hours. BNP: Invalid input(s): POCBNP CBG: Recent Labs  Lab 02/12/19 1137 02/12/19 1728 02/12/19 2144 02/13/19 0738 02/13/19 1148  GLUCAP 225* 200* 85 301* 165*   D-Dimer No results for input(s): DDIMER in the last 72 hours. Hgb A1c No results for input(s): HGBA1C in the last 72 hours. Lipid Profile No results for input(s): CHOL, HDL, LDLCALC, TRIG, CHOLHDL, LDLDIRECT in the last 72 hours. Thyroid function studies No results for input(s): TSH, T4TOTAL, T3FREE, THYROIDAB in the last 72 hours.  Invalid input(s): FREET3 Anemia work up No results for input(s): VITAMINB12, FOLATE, FERRITIN, TIBC, IRON, RETICCTPCT in the last 72 hours. Urinalysis    Component Value Date/Time   COLORURINE STRAW (A) 02/07/2019 0536   APPEARANCEUR CLEAR (A) 02/07/2019 0536   APPEARANCEUR CLEAR 06/07/2014 1233   LABSPEC 1.010 02/07/2019 0536   LABSPEC 1.010 06/07/2014 1233   PHURINE 5.0 02/07/2019 0536   GLUCOSEU >=500 (A) 02/07/2019 0536   GLUCOSEU 500 mg/dL 06/07/2014 1233   HGBUR NEGATIVE 02/07/2019 0536   BILIRUBINUR NEGATIVE 02/07/2019 0536   BILIRUBINUR NEGATIVE 06/07/2014  1233   KETONESUR NEGATIVE 02/07/2019 0536   PROTEINUR 30 (A) 02/07/2019 0536   NITRITE NEGATIVE 02/07/2019 0536   LEUKOCYTESUR NEGATIVE 02/07/2019 0536   LEUKOCYTESUR NEGATIVE 06/07/2014 1233   Sepsis Labs Invalid input(s): PROCALCITONIN,  WBC,  LACTICIDVEN Microbiology Recent Results (from the past 240 hour(s))  SARS CORONAVIRUS 2 (TAT 6-24 HRS) Nasopharyngeal Nasopharyngeal Swab  Status: None   Collection Time: 02/10/19  2:16 PM   Specimen: Nasopharyngeal Swab  Result Value Ref Range Status   SARS Coronavirus 2 NEGATIVE NEGATIVE Final    Comment: (NOTE) SARS-CoV-2 target nucleic acids are NOT DETECTED. The SARS-CoV-2 RNA is generally detectable in upper and lower respiratory specimens during the acute phase of infection. Negative results do not preclude SARS-CoV-2 infection, do not rule out co-infections with other pathogens, and should not be used as the sole basis for treatment or other patient management decisions. Negative results must be combined with clinical observations, patient history, and epidemiological information. The expected result is Negative. Fact Sheet for Patients: SugarRoll.be Fact Sheet for Healthcare Providers: https://www.woods-mathews.com/ This test is not yet approved or cleared by the [place] FDA and  has been authorized for detection and/or diagnosis of SARS-CoV-2 by FDA under an Emergency Use Authorization (EUA). This EUA will remain  in effect (meaning this test can be used) for the duration of the COVID-19 declaration under Section 56 4(b)(1) of the Act, 21 U.S.C. section 360bbb-3(b)(1), unless the authorization is terminated or revoked sooner. Performed at [facility], [place] [address], [place], [place] [address]      Time coordinating discharge: Over 30 minutes  SIGNED:   Ezekiel Slocumb, DO [facility] 02/13/2019, 12:23 PM   If 7PM-7AM, please contact  night-coverage [email or web address] Password [email or web address]

## 2019-02-13 NOTE — Progress Notes (Addendum)
Visit made to new referral for [facility] hospice home. Writer met with patient's sister [name] to initiate education regarding hospice services, philosophy, team approach to care and current visitation policy with understanding voiced. [name] is agreeable for Marycruz to transfer to the hospice home this afternoon. Hospital care team updated. Report called to the hospice home. Discharge summary to be faxed when posted. Will continue to follow through discharge. Flo Shanks BSN, RN, [place] [phone]

## 2019-02-13 NOTE — Progress Notes (Signed)
Patient transported via EMS to [facility]. Adelina Mings and  Nona Dell (the patient's sisters) notified.

## 2019-02-20 ENCOUNTER — Inpatient Hospital Stay: Payer: Medicare PPO

## 2019-02-21 DEATH — deceased

## 2019-02-26 ENCOUNTER — Ambulatory Visit: Payer: Medicare PPO | Admitting: Speech Pathology

## 2019-03-04 ENCOUNTER — Encounter: Payer: Medicare PPO | Admitting: Speech Pathology

## 2019-03-06 ENCOUNTER — Encounter: Payer: Medicare PPO | Admitting: Speech Pathology

## 2019-03-11 ENCOUNTER — Encounter: Payer: Medicare PPO | Admitting: Speech Pathology

## 2019-03-13 ENCOUNTER — Encounter: Payer: Medicare PPO | Admitting: Speech Pathology

## 2019-03-18 ENCOUNTER — Encounter: Payer: Medicare PPO | Admitting: Speech Pathology

## 2019-03-20 ENCOUNTER — Encounter: Payer: Medicare PPO | Admitting: Speech Pathology

## 2019-03-25 ENCOUNTER — Encounter: Payer: Medicare PPO | Admitting: Speech Pathology

## 2019-03-27 ENCOUNTER — Encounter: Payer: Medicare PPO | Admitting: Speech Pathology

## 2019-04-01 ENCOUNTER — Encounter: Payer: Medicare PPO | Admitting: Speech Pathology

## 2019-04-03 ENCOUNTER — Encounter: Payer: Medicare PPO | Admitting: Speech Pathology

## 2019-04-08 ENCOUNTER — Encounter: Payer: Medicare PPO | Admitting: Speech Pathology

## 2019-04-10 ENCOUNTER — Encounter: Payer: Medicare PPO | Admitting: Speech Pathology

## 2019-04-15 ENCOUNTER — Encounter: Payer: Medicare PPO | Admitting: Speech Pathology

## 2019-04-17 ENCOUNTER — Encounter: Payer: Medicare PPO | Admitting: Speech Pathology

## 2020-04-29 ENCOUNTER — Other Ambulatory Visit: Payer: Self-pay | Admitting: *Deleted

## 2020-10-17 IMAGING — CT CT HEAD W/O CM
3 series · 16 of 46 positions shown, 19 images · non-contrast
Comparison: 12/31/2018

CLINICAL DATA: Altered mental status

EXAM:
CT HEAD WITHOUT CONTRAST
TECHNIQUE: Contiguous axial images were obtained from the base of the skull
through the vertex without intravenous contrast.

[Series 3: head wo · axial · 0.41mm/px · z∈[-131,-11]mm · 10 of 29 slices shown, 13 images]
[im 3/29  brain]
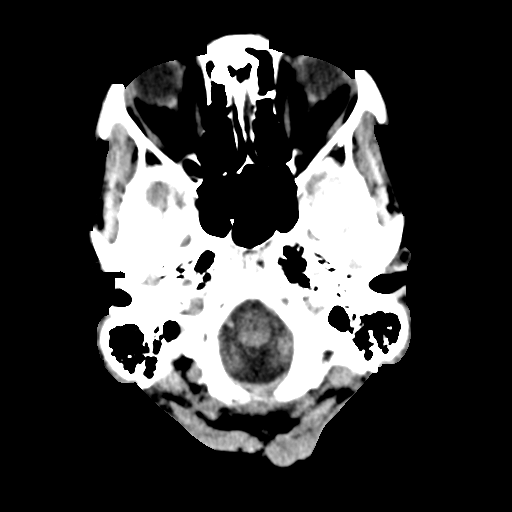
[im 3/29  bone]
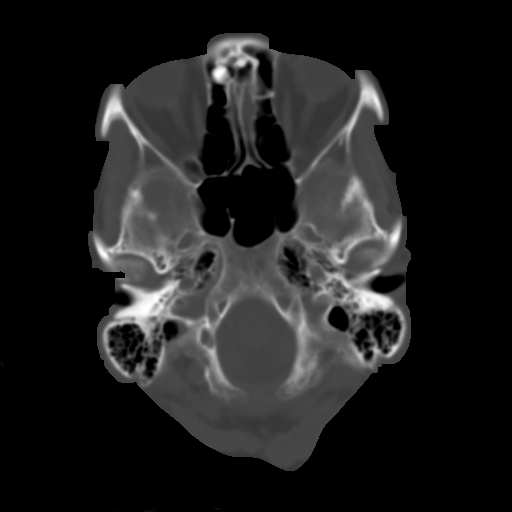
[im 6/29  brain]
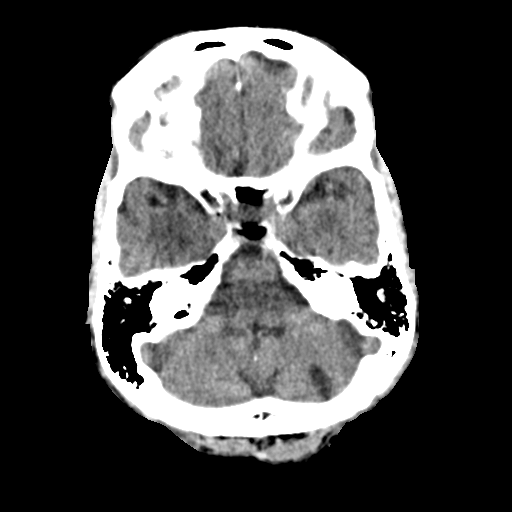
[im 8/29  brain]
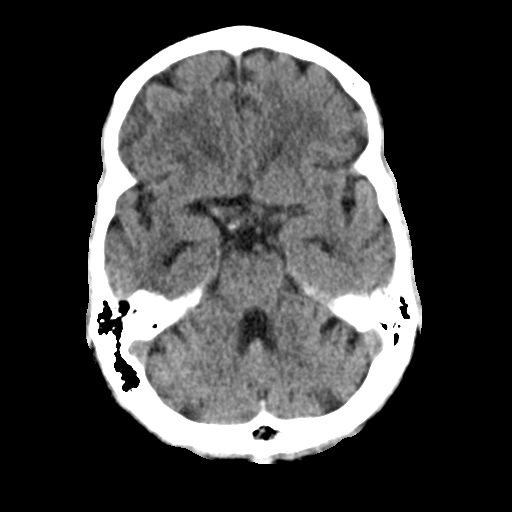
[im 11/29  brain]
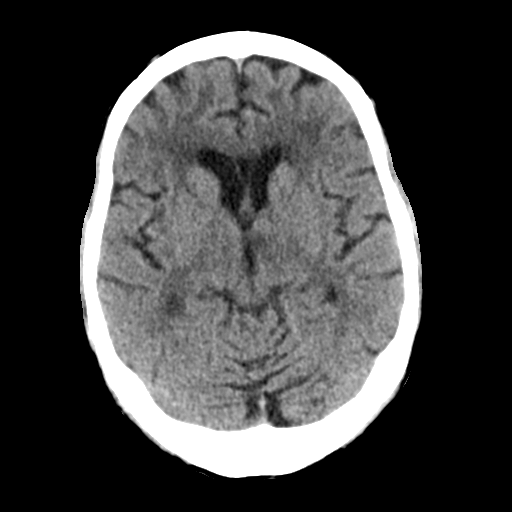
[im 14/29  brain]
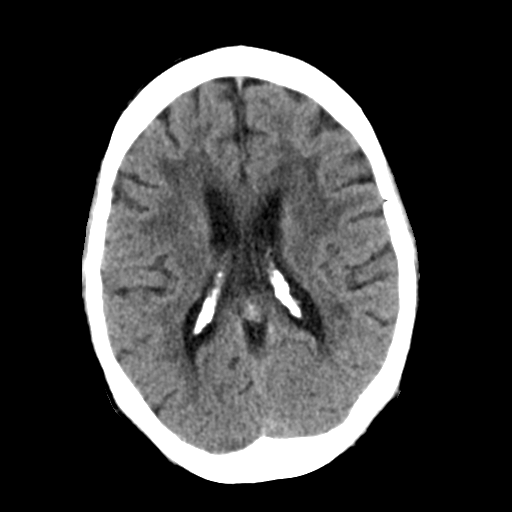
[im 14/29  bone]
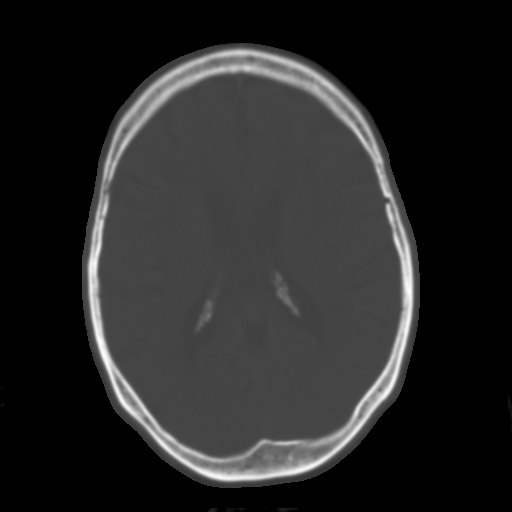
[im 16/29  brain]
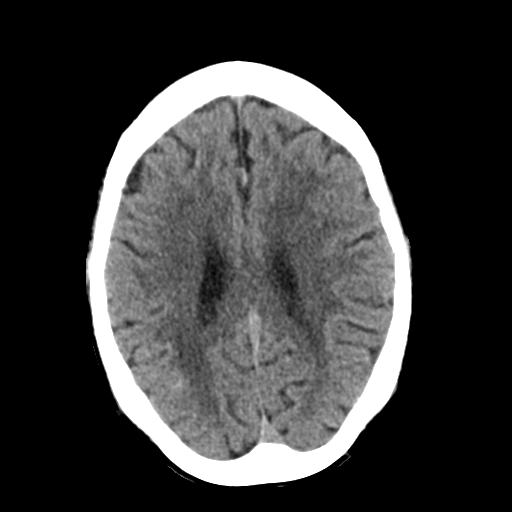
[im 19/29  brain]
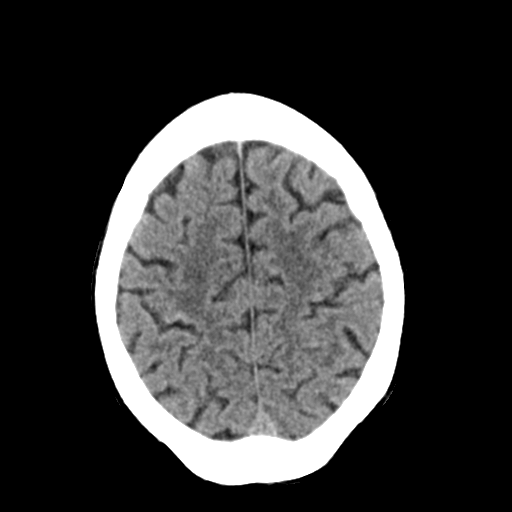
[im 22/29  brain]
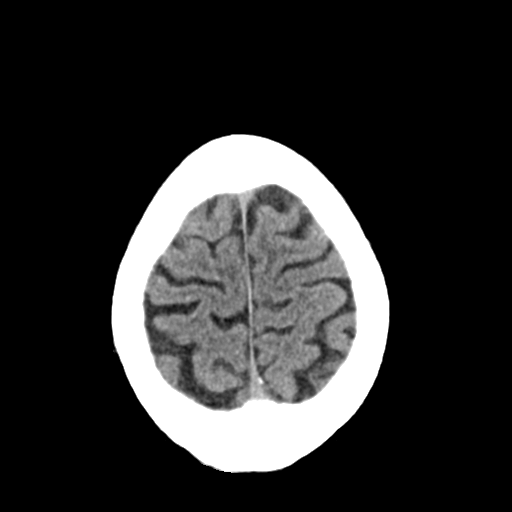
[im 24/29  brain]
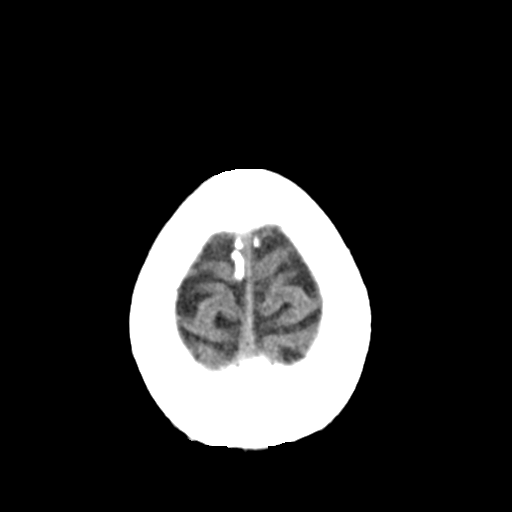
[im 24/29  bone]
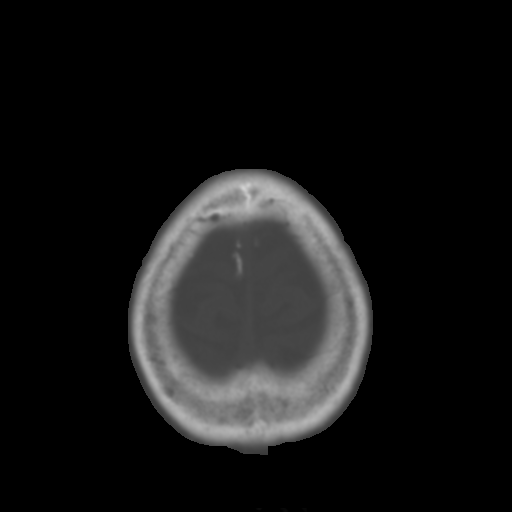
[im 27/29  brain]
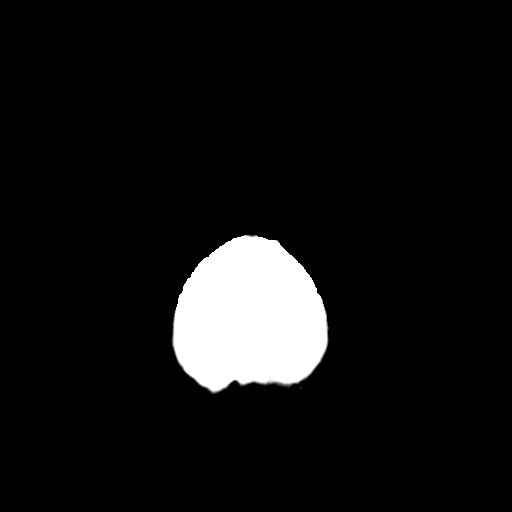

[Series 4: coronal soft tissue · coronal · 0.28mm/px · 3 of 63 slices shown]
[im 21/63  brain]
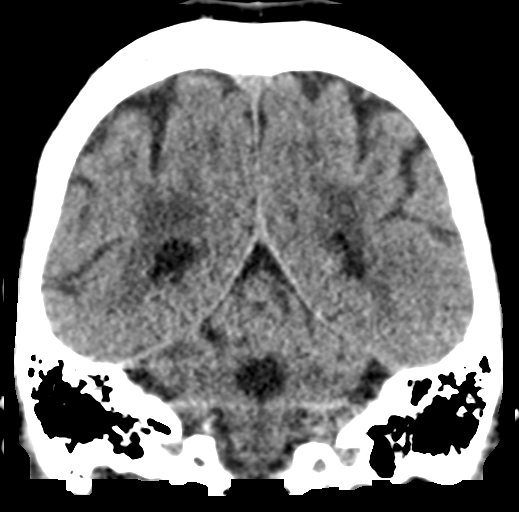
[im 28/63  brain]
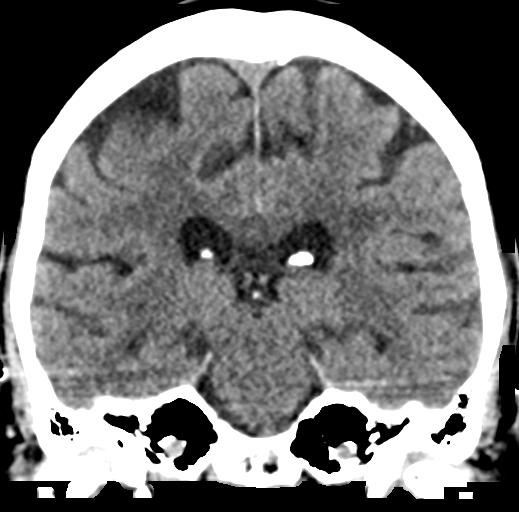
[im 35/63  brain]
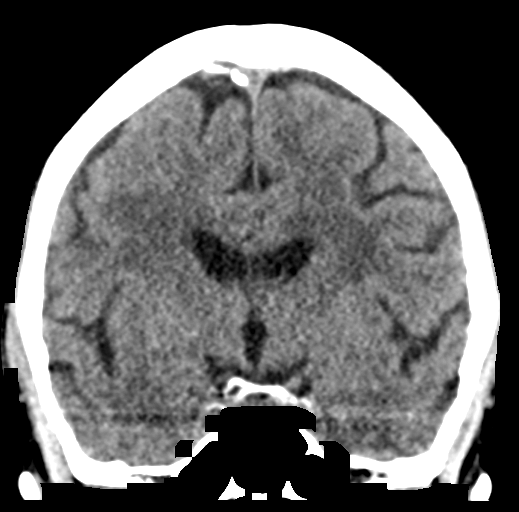

[Series 5: sagittal soft tissue · sagittal · 0.28mm/px · 3 of 48 slices shown]
[im 16/48  brain]
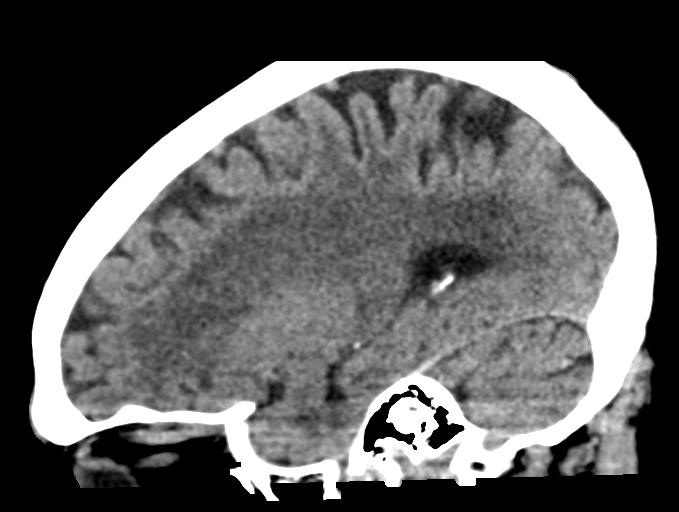
[im 24/48  brain]
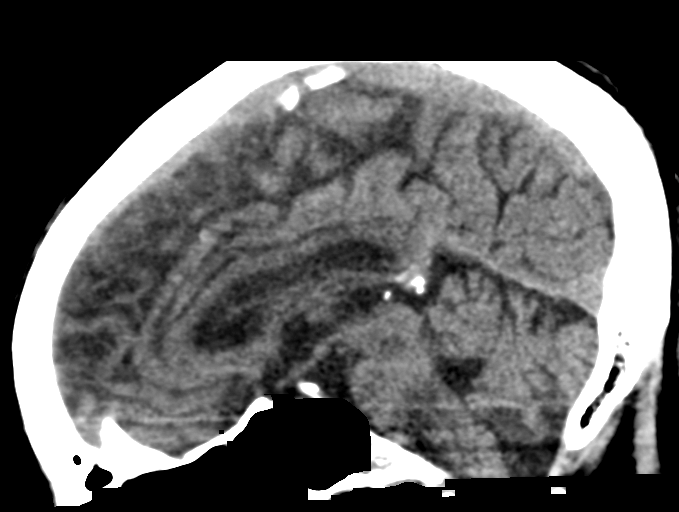
[im 32/48  brain]
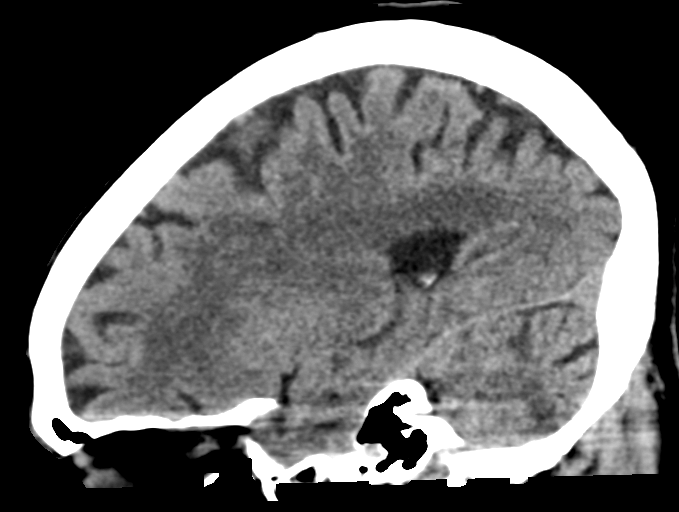

[16 of 46 positions shown; findings below may reference images not displayed]

FINDINGS: Brain: Chronic atrophic and ischemic changes are again seen and
stable. No acute hemorrhage, acute infarction or space-occupying
mass lesion are seen.

Vascular: No hyperdense vessel or unexpected calcification.

Skull: Normal. Negative for fracture or focal lesion.

Sinuses/Orbits: No acute finding.

Other: None.
IMPRESSION: Chronic atrophic and ischemic changes similar to that seen on the
prior exam. No acute abnormality is noted.

## 2020-10-17 IMAGING — DX DG CHEST 1V PORT
1 series · 1 of 1 positions shown · non-contrast
Comparison: 01/23/2019

CLINICAL DATA: Shortness of breath, stomach pain

EXAM:
PORTABLE CHEST 1 VIEW

[chest ap]
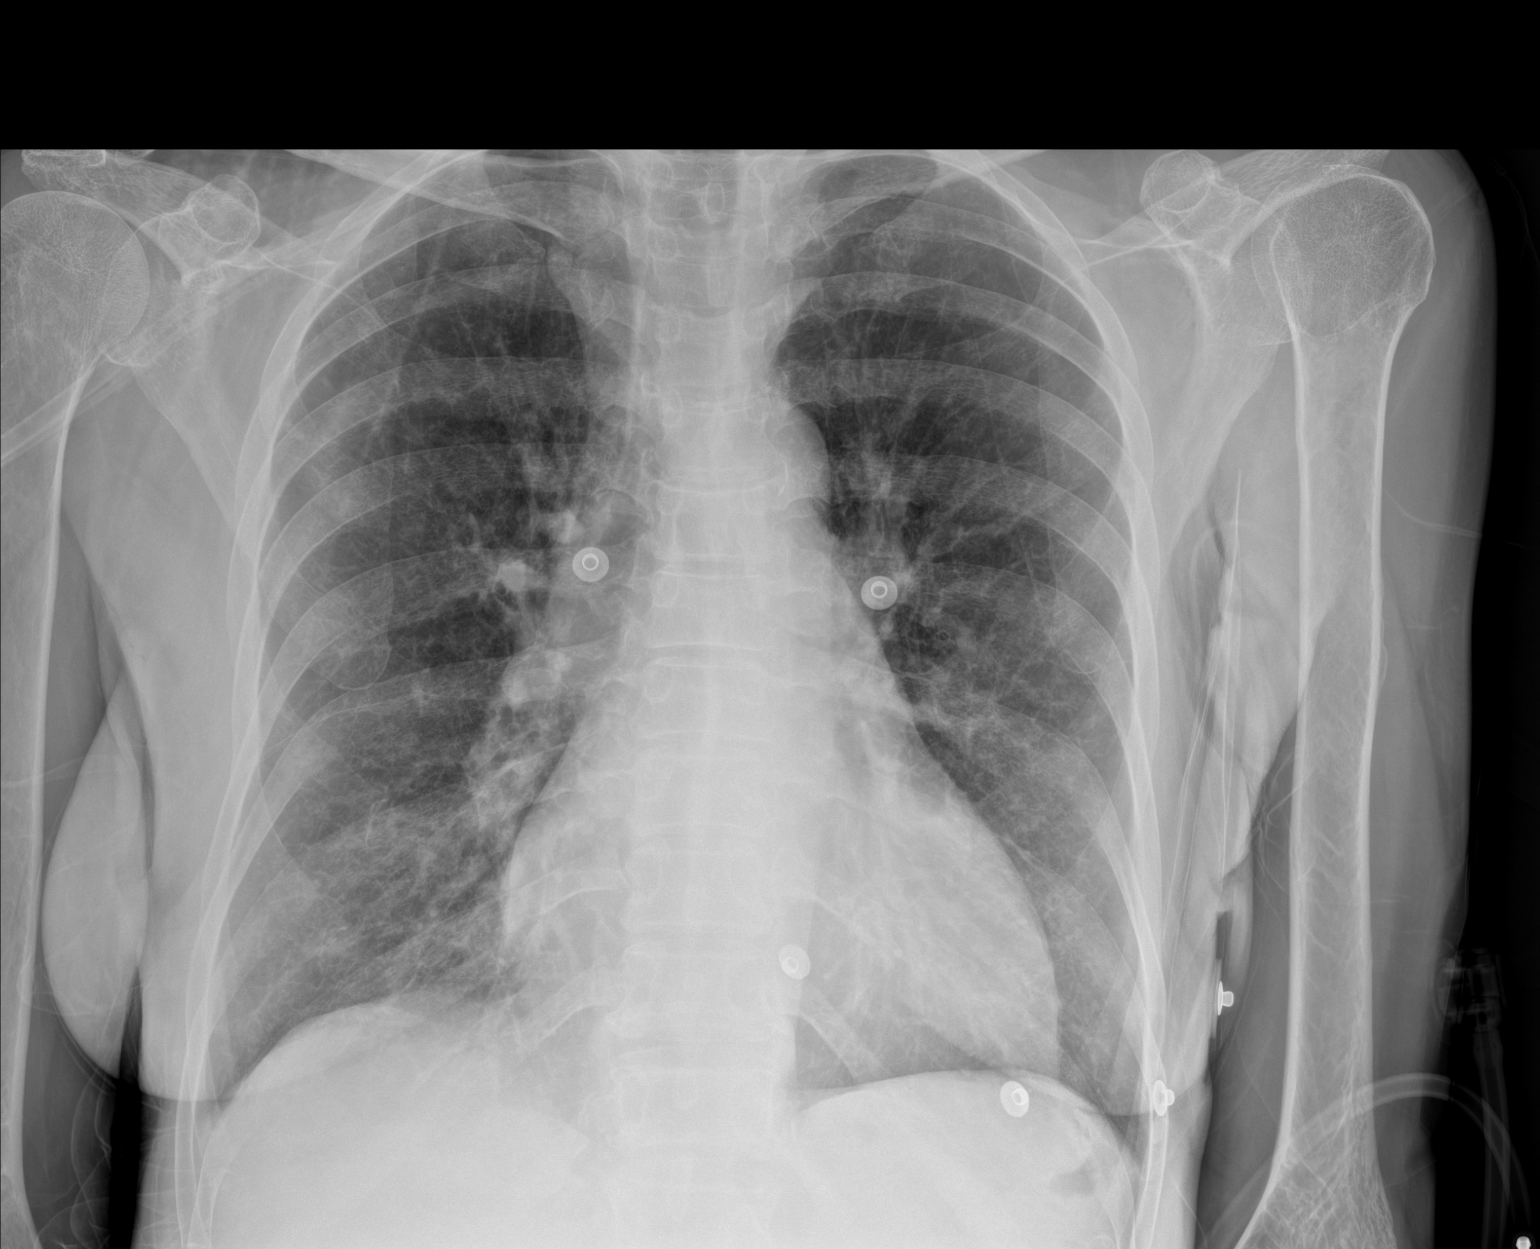

[1 of 1 positions shown; findings below may reference images not displayed]

FINDINGS: The heart size and mediastinal contours are within normal limits.
Emphysema. The visualized skeletal structures are unremarkable.
IMPRESSION: Emphysema without acute abnormality of the lungs in AP portable
projection.
